# Patient Record
Sex: Male | Born: 1960 | Race: White | Hispanic: No | Marital: Married | Smoking: Former smoker
Health system: Southern US, Community
[De-identification: ages and names within clinical notes are randomized; demographics above are authoritative.]

## PROBLEM LIST (undated history)

## (undated) DIAGNOSIS — N529 Male erectile dysfunction, unspecified: Secondary | ICD-10-CM

## (undated) DIAGNOSIS — C787 Secondary malignant neoplasm of liver and intrahepatic bile duct: Secondary | ICD-10-CM

## (undated) DIAGNOSIS — C801 Malignant (primary) neoplasm, unspecified: Secondary | ICD-10-CM

## (undated) DIAGNOSIS — N400 Enlarged prostate without lower urinary tract symptoms: Secondary | ICD-10-CM

## (undated) DIAGNOSIS — K635 Polyp of colon: Secondary | ICD-10-CM

## (undated) DIAGNOSIS — C189 Malignant neoplasm of colon, unspecified: Secondary | ICD-10-CM

## (undated) DIAGNOSIS — J189 Pneumonia, unspecified organism: Secondary | ICD-10-CM

## (undated) DIAGNOSIS — E7800 Pure hypercholesterolemia, unspecified: Secondary | ICD-10-CM

## (undated) DIAGNOSIS — K219 Gastro-esophageal reflux disease without esophagitis: Secondary | ICD-10-CM

## (undated) DIAGNOSIS — I10 Essential (primary) hypertension: Secondary | ICD-10-CM

## (undated) HISTORY — DX: Malignant neoplasm of colon, unspecified: C18.9

## (undated) HISTORY — DX: Secondary malignant neoplasm of liver and intrahepatic bile duct: C78.7

## (undated) HISTORY — DX: Pure hypercholesterolemia, unspecified: E78.00

## (undated) HISTORY — DX: Polyp of colon: K63.5

## (undated) HISTORY — DX: Essential (primary) hypertension: I10

## (undated) HISTORY — DX: Male erectile dysfunction, unspecified: N52.9

## (undated) HISTORY — PX: PORT-A-CATH REMOVAL: SHX5289

## (undated) HISTORY — PX: FOOT SURGERY: SHX648

## (undated) HISTORY — DX: Malignant (primary) neoplasm, unspecified: C80.1

## (undated) HISTORY — DX: Benign prostatic hyperplasia without lower urinary tract symptoms: N40.0

---
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
## 2005-02-10 ENCOUNTER — Ambulatory Visit: Payer: Self-pay | Admitting: Family Medicine

## 2005-02-11 ENCOUNTER — Ambulatory Visit: Payer: Self-pay | Admitting: Family Medicine

## 2005-03-26 ENCOUNTER — Ambulatory Visit: Payer: Self-pay | Admitting: Family Medicine

## 2005-05-07 ENCOUNTER — Ambulatory Visit: Payer: Self-pay | Admitting: Family Medicine

## 2005-05-13 ENCOUNTER — Ambulatory Visit: Payer: Self-pay | Admitting: Family Medicine

## 2005-08-12 ENCOUNTER — Ambulatory Visit: Payer: Self-pay | Admitting: Family Medicine

## 2005-12-08 ENCOUNTER — Ambulatory Visit: Payer: Self-pay | Admitting: Family Medicine

## 2006-01-12 ENCOUNTER — Ambulatory Visit (HOSPITAL_COMMUNITY): Admission: RE | Admit: 2006-01-12 | Discharge: 2006-01-12 | Payer: Self-pay | Admitting: Chiropractic Medicine

## 2006-01-19 ENCOUNTER — Encounter: Admission: RE | Admit: 2006-01-19 | Discharge: 2006-01-19 | Payer: Self-pay | Admitting: Orthopedic Surgery

## 2006-02-12 ENCOUNTER — Ambulatory Visit: Payer: Self-pay | Admitting: Family Medicine

## 2006-02-15 ENCOUNTER — Ambulatory Visit: Payer: Self-pay | Admitting: Family Medicine

## 2006-03-10 ENCOUNTER — Ambulatory Visit: Payer: Self-pay | Admitting: Family Medicine

## 2006-03-29 ENCOUNTER — Ambulatory Visit: Payer: Self-pay | Admitting: Family Medicine

## 2006-05-13 ENCOUNTER — Ambulatory Visit: Payer: Self-pay | Admitting: Family Medicine

## 2006-05-17 ENCOUNTER — Ambulatory Visit: Payer: Self-pay | Admitting: Family Medicine

## 2006-05-28 ENCOUNTER — Ambulatory Visit: Payer: Self-pay | Admitting: Family Medicine

## 2007-02-15 ENCOUNTER — Encounter: Payer: Self-pay | Admitting: Family Medicine

## 2007-02-16 DIAGNOSIS — E7800 Pure hypercholesterolemia, unspecified: Secondary | ICD-10-CM

## 2007-02-16 DIAGNOSIS — I10 Essential (primary) hypertension: Secondary | ICD-10-CM | POA: Insufficient documentation

## 2007-02-23 ENCOUNTER — Ambulatory Visit: Payer: Self-pay | Admitting: Family Medicine

## 2007-02-23 LAB — CONVERTED CEMR LAB
ALT: 28 units/L (ref 0–53)
Albumin: 4 g/dL (ref 3.5–5.2)
BUN: 18 mg/dL (ref 6–23)
Bilirubin, Direct: 0.2 mg/dL (ref 0.0–0.3)
Calcium: 9 mg/dL (ref 8.4–10.5)
Cholesterol: 87 mg/dL (ref 0–200)
GFR calc Af Amer: 76 mL/min
GFR calc non Af Amer: 63 mL/min
Glucose, Bld: 98 mg/dL (ref 70–99)
LDL Cholesterol: 48 mg/dL (ref 0–99)
Total CHOL/HDL Ratio: 3.2
VLDL: 12 mg/dL (ref 0–40)

## 2007-02-25 ENCOUNTER — Ambulatory Visit: Payer: Self-pay | Admitting: Family Medicine

## 2007-02-25 DIAGNOSIS — K645 Perianal venous thrombosis: Secondary | ICD-10-CM

## 2007-02-25 DIAGNOSIS — N529 Male erectile dysfunction, unspecified: Secondary | ICD-10-CM | POA: Insufficient documentation

## 2008-02-27 ENCOUNTER — Ambulatory Visit: Payer: Self-pay | Admitting: Family Medicine

## 2008-02-27 LAB — CONVERTED CEMR LAB
ALT: 28 units/L (ref 0–53)
CO2: 26 meq/L (ref 19–32)
Calcium: 8.9 mg/dL (ref 8.4–10.5)
Creatinine, Ser: 1.3 mg/dL (ref 0.4–1.5)
Glucose, Bld: 88 mg/dL (ref 70–99)
HDL: 22 mg/dL — ABNORMAL LOW (ref >39.0)
TSH: 1.41 microintl units/mL (ref 0.35–5.50)
Total Bilirubin: 1 mg/dL (ref 0.3–1.2)
Total CHOL/HDL Ratio: 4.2
Total Protein: 6.9 g/dL (ref 6.0–8.3)
Triglycerides: 63 mg/dL (ref 0–149)
VLDL: 13 mg/dL (ref 0–40)

## 2008-02-29 ENCOUNTER — Ambulatory Visit: Payer: Self-pay | Admitting: Family Medicine

## 2008-12-13 ENCOUNTER — Ambulatory Visit: Payer: Self-pay | Admitting: Family Medicine

## 2008-12-13 DIAGNOSIS — R3129 Other microscopic hematuria: Secondary | ICD-10-CM

## 2008-12-13 LAB — CONVERTED CEMR LAB
Bacteria, UA: 0
Epithelial cells, urine: 0 /lpf
Ketones, urine, test strip: NEGATIVE
Protein, U semiquant: NEGATIVE

## 2009-01-14 ENCOUNTER — Encounter: Payer: Self-pay | Admitting: Family Medicine

## 2009-02-25 ENCOUNTER — Ambulatory Visit: Payer: Self-pay | Admitting: Family Medicine

## 2009-02-25 LAB — CONVERTED CEMR LAB
AST: 29 units/L (ref 0–37)
Albumin: 3.9 g/dL (ref 3.5–5.2)
Alkaline Phosphatase: 42 units/L (ref 39–117)
Basophils Relative: 0.3 % (ref 0.0–3.0)
Calcium: 9 mg/dL (ref 8.4–10.5)
Eosinophils Absolute: 0.6 10*3/uL (ref 0.0–0.7)
Eosinophils Relative: 7.5 % — ABNORMAL HIGH (ref 0.0–5.0)
GFR calc non Af Amer: 67.99 mL/min (ref >60)
HDL: 30.2 mg/dL — ABNORMAL LOW (ref >39.00)
LDL Cholesterol: 46 mg/dL (ref 0–99)
Lymphocytes Relative: 30.1 % (ref 12.0–46.0)
MCHC: 33.4 g/dL (ref 30.0–36.0)
MCV: 100.1 fL — ABNORMAL HIGH (ref 78.0–100.0)
Monocytes Absolute: 0.6 10*3/uL (ref 0.1–1.0)
Neutrophils Relative %: 54.9 % (ref 43.0–77.0)
PSA: 0.64 ng/mL (ref 0.10–4.00)
Platelets: 220 10*3/uL (ref 150.0–400.0)
Potassium: 4.7 meq/L (ref 3.5–5.1)
RBC: 5.08 M/uL (ref 4.22–5.81)
Sodium: 138 meq/L (ref 135–145)
TSH: 1.21 microintl units/mL (ref 0.35–5.50)
Total Bilirubin: 1 mg/dL (ref 0.3–1.2)
VLDL: 13.2 mg/dL (ref 0.0–40.0)
WBC: 8.5 10*3/uL (ref 4.5–10.5)

## 2009-03-04 ENCOUNTER — Ambulatory Visit: Payer: Self-pay | Admitting: Family Medicine

## 2009-03-04 DIAGNOSIS — R739 Hyperglycemia, unspecified: Secondary | ICD-10-CM | POA: Insufficient documentation

## 2009-05-09 ENCOUNTER — Encounter: Payer: Self-pay | Admitting: Family Medicine

## 2009-06-08 HISTORY — PX: PORTACATH PLACEMENT: SHX2246

## 2010-01-14 ENCOUNTER — Encounter (INDEPENDENT_AMBULATORY_CARE_PROVIDER_SITE_OTHER): Payer: Self-pay | Admitting: *Deleted

## 2010-01-16 ENCOUNTER — Encounter: Payer: Self-pay | Admitting: Family Medicine

## 2010-03-08 DIAGNOSIS — C787 Secondary malignant neoplasm of liver and intrahepatic bile duct: Secondary | ICD-10-CM

## 2010-03-08 DIAGNOSIS — C189 Malignant neoplasm of colon, unspecified: Secondary | ICD-10-CM

## 2010-03-08 HISTORY — DX: Malignant neoplasm of colon, unspecified: C18.9

## 2010-03-08 HISTORY — PX: COLON SURGERY: SHX602

## 2010-03-08 HISTORY — DX: Malignant neoplasm of colon, unspecified: C78.7

## 2010-03-10 ENCOUNTER — Ambulatory Visit: Payer: Self-pay | Admitting: Family Medicine

## 2010-03-11 LAB — CONVERTED CEMR LAB
AST: 24 units/L (ref 0–37)
Albumin: 4 g/dL (ref 3.5–5.2)
BUN: 15 mg/dL (ref 6–23)
Basophils Absolute: 0 10*3/uL (ref 0.0–0.1)
CO2: 28 meq/L (ref 19–32)
Cholesterol: 90 mg/dL (ref 0–200)
Eosinophils Absolute: 0.2 10*3/uL (ref 0.0–0.7)
Glucose, Bld: 99 mg/dL (ref 70–99)
HCT: 44.7 % (ref 39.0–52.0)
Hemoglobin: 15.5 g/dL (ref 13.0–17.0)
Lymphs Abs: 2.7 10*3/uL (ref 0.7–4.0)
MCHC: 34.7 g/dL (ref 30.0–36.0)
MCV: 97.1 fL (ref 78.0–100.0)
Monocytes Absolute: 0.5 10*3/uL (ref 0.1–1.0)
Neutro Abs: 4.4 10*3/uL (ref 1.4–7.7)
PSA: 0.42 ng/mL (ref 0.10–4.00)
Platelets: 258 10*3/uL (ref 150.0–400.0)
Potassium: 4.8 meq/L (ref 3.5–5.1)
RDW: 13.2 % (ref 11.5–14.6)
Sodium: 135 meq/L (ref 135–145)

## 2010-04-01 ENCOUNTER — Encounter: Payer: Self-pay | Admitting: Family Medicine

## 2010-04-01 ENCOUNTER — Telehealth: Payer: Self-pay | Admitting: Gastroenterology

## 2010-04-01 ENCOUNTER — Telehealth: Payer: Self-pay | Admitting: Family Medicine

## 2010-04-01 ENCOUNTER — Ambulatory Visit: Payer: Self-pay | Admitting: Cardiology

## 2010-04-01 ENCOUNTER — Inpatient Hospital Stay (HOSPITAL_COMMUNITY)
Admission: EM | Admit: 2010-04-01 | Discharge: 2010-04-08 | Payer: Self-pay | Source: Home / Self Care | Admitting: Internal Medicine

## 2010-04-01 ENCOUNTER — Ambulatory Visit: Payer: Self-pay | Admitting: Internal Medicine

## 2010-04-01 LAB — CONVERTED CEMR LAB
ALT: 64 U/L — ABNORMAL HIGH
AST: 35 U/L
Albumin: 3.7 g/dL
Alkaline Phosphatase: 49 U/L
BUN: 20 mg/dL
Basophils Absolute: 0 K/uL
Basophils Relative: 0.2 %
Bilirubin Urine: NEGATIVE
Bilirubin, Direct: 0.2 mg/dL
CO2: 28 meq/L
Calcium: 9.2 mg/dL
Casts: 0 /LPF
Chloride: 100 meq/L
Creatinine, Ser: 1.4 mg/dL
Eosinophils Absolute: 0.1 K/uL
Eosinophils Relative: 0.4 %
Epithelial cells, urine: 0 /LPF
GFR calc non Af Amer: 57.61 mL/min
Glucose, Bld: 101 mg/dL — ABNORMAL HIGH
Glucose, Urine, Semiquant: NEGATIVE
HCT: 45.2 %
Hemoglobin: 15.5 g/dL
Ketones, urine, test strip: NEGATIVE
Lipase: 16 U/L
Lymphocytes Relative: 15.6 %
Lymphs Abs: 2.3 K/uL
MCHC: 34.3 g/dL
MCV: 97.1 fL
Monocytes Absolute: 1.2 K/uL — ABNORMAL HIGH
Monocytes Relative: 8.4 %
Neutro Abs: 11.1 K/uL — ABNORMAL HIGH
Neutrophils Relative %: 75.4 %
Nitrite: NEGATIVE
Platelets: 323 K/uL
Potassium: 5.1 meq/L
RBC: 4.66 M/uL
RDW: 13.5 %
Sodium: 135 meq/L
Specific Gravity, Urine: 1.02
TSH: 1.86 u[IU]/mL
Total Bilirubin: 0.9 mg/dL
Total CK: 82 units/L (ref 7–232)
Total Protein: 6.9 g/dL
Urobilinogen, UA: 2
WBC: 14.7 10*3/microliter — ABNORMAL HIGH
pH: 6

## 2010-04-03 ENCOUNTER — Encounter (INDEPENDENT_AMBULATORY_CARE_PROVIDER_SITE_OTHER): Payer: Self-pay

## 2010-04-03 ENCOUNTER — Other Ambulatory Visit (INDEPENDENT_AMBULATORY_CARE_PROVIDER_SITE_OTHER): Payer: Self-pay | Admitting: Surgery

## 2010-04-03 HISTORY — PX: RIGHT COLECTOMY: SHX853

## 2010-04-18 ENCOUNTER — Encounter: Payer: Self-pay | Admitting: Family Medicine

## 2010-04-22 ENCOUNTER — Ambulatory Visit: Payer: Self-pay | Admitting: Oncology

## 2010-04-29 ENCOUNTER — Encounter: Payer: Self-pay | Admitting: Family Medicine

## 2010-05-06 ENCOUNTER — Ambulatory Visit (HOSPITAL_COMMUNITY)
Admission: RE | Admit: 2010-05-06 | Discharge: 2010-05-06 | Payer: Self-pay | Source: Home / Self Care | Admitting: Surgery

## 2010-05-08 LAB — CBC WITH DIFFERENTIAL/PLATELET
Basophils Absolute: 0 10*3/uL (ref 0.0–0.1)
Eosinophils Absolute: 0.1 10*3/uL (ref 0.0–0.5)
HGB: 14.9 g/dL (ref 13.0–17.1)
LYMPH%: 45.2 % (ref 14.0–49.0)
MCV: 95.7 fL (ref 79.3–98.0)
MONO#: 0.6 10*3/uL (ref 0.1–0.9)
MONO%: 8.7 % (ref 0.0–14.0)
NEUT#: 3 10*3/uL (ref 1.5–6.5)
Platelets: 249 10*3/uL (ref 140–400)
WBC: 6.8 10*3/uL (ref 4.0–10.3)

## 2010-05-08 LAB — MAGNESIUM: Magnesium: 2.3 mg/dL (ref 1.5–2.5)

## 2010-05-08 LAB — COMPREHENSIVE METABOLIC PANEL
Albumin: 4 g/dL (ref 3.5–5.2)
Alkaline Phosphatase: 45 U/L (ref 39–117)
BUN: 17 mg/dL (ref 6–23)
CO2: 29 mEq/L (ref 19–32)
Glucose, Bld: 85 mg/dL (ref 70–99)
Potassium: 4.4 mEq/L (ref 3.5–5.3)
Total Bilirubin: 0.7 mg/dL (ref 0.3–1.2)
Total Protein: 6.9 g/dL (ref 6.0–8.3)

## 2010-05-10 DIAGNOSIS — C189 Malignant neoplasm of colon, unspecified: Secondary | ICD-10-CM

## 2010-05-13 ENCOUNTER — Encounter: Payer: Self-pay | Admitting: Family Medicine

## 2010-05-26 ENCOUNTER — Ambulatory Visit: Payer: Self-pay | Admitting: Oncology

## 2010-05-27 LAB — COMPREHENSIVE METABOLIC PANEL
ALT: 50 U/L (ref 0–53)
Albumin: 3.8 g/dL (ref 3.5–5.2)
CO2: 29 mEq/L (ref 19–32)
Calcium: 9 mg/dL (ref 8.4–10.5)
Chloride: 104 mEq/L (ref 96–112)
Glucose, Bld: 111 mg/dL — ABNORMAL HIGH (ref 70–99)
Potassium: 4.1 mEq/L (ref 3.5–5.3)
Sodium: 138 mEq/L (ref 135–145)
Total Bilirubin: 0.5 mg/dL (ref 0.3–1.2)
Total Protein: 6.8 g/dL (ref 6.0–8.3)

## 2010-05-27 LAB — CBC WITH DIFFERENTIAL/PLATELET
BASO%: 0.5 % (ref 0.0–2.0)
Eosinophils Absolute: 0.2 10*3/uL (ref 0.0–0.5)
LYMPH%: 49.7 % — ABNORMAL HIGH (ref 14.0–49.0)
MCHC: 34.2 g/dL (ref 32.0–36.0)
MONO#: 0.4 10*3/uL (ref 0.1–0.9)
NEUT#: 1.6 10*3/uL (ref 1.5–6.5)
Platelets: 221 10*3/uL (ref 140–400)
RBC: 4.62 10*6/uL (ref 4.20–5.82)
RDW: 13.6 % (ref 11.0–14.6)
WBC: 4.2 10*3/uL (ref 4.0–10.3)
lymph#: 2.1 10*3/uL (ref 0.9–3.3)

## 2010-05-27 LAB — MAGNESIUM: Magnesium: 2.3 mg/dL (ref 1.5–2.5)

## 2010-05-30 ENCOUNTER — Encounter: Payer: Self-pay | Admitting: Family Medicine

## 2010-06-11 ENCOUNTER — Ambulatory Visit: Payer: Self-pay | Admitting: Oncology

## 2010-06-11 LAB — CBC WITH DIFFERENTIAL/PLATELET
BASO%: 0.6 % (ref 0.0–2.0)
Basophils Absolute: 0 10*3/uL (ref 0.0–0.1)
EOS%: 4.1 % (ref 0.0–7.0)
Eosinophils Absolute: 0.1 10*3/uL (ref 0.0–0.5)
HCT: 44.7 % (ref 38.4–49.9)
HGB: 15.9 g/dL (ref 13.0–17.1)
LYMPH%: 63.8 % — ABNORMAL HIGH (ref 14.0–49.0)
MCH: 32.7 pg (ref 27.2–33.4)
MCHC: 35.6 g/dL (ref 32.0–36.0)
MCV: 92 fL (ref 79.3–98.0)
MONO#: 0.4 10*3/uL (ref 0.1–0.9)
MONO%: 12.5 % (ref 0.0–14.0)
NEUT#: 0.6 10*3/uL — ABNORMAL LOW (ref 1.5–6.5)
NEUT%: 19 % — ABNORMAL LOW (ref 39.0–75.0)
Platelets: 141 10*3/uL (ref 140–400)
RBC: 4.86 10*6/uL (ref 4.20–5.82)
RDW: 13.6 % (ref 11.0–14.6)
WBC: 3.2 10*3/uL — ABNORMAL LOW (ref 4.0–10.3)
lymph#: 2 10*3/uL (ref 0.9–3.3)
nRBC: 0 % (ref 0–0)

## 2010-06-11 LAB — COMPREHENSIVE METABOLIC PANEL
ALT: 64 U/L — ABNORMAL HIGH (ref 0–53)
AST: 45 U/L — ABNORMAL HIGH (ref 0–37)
Albumin: 3.5 g/dL (ref 3.5–5.2)
Alkaline Phosphatase: 58 U/L (ref 39–117)
BUN: 17 mg/dL (ref 6–23)
CO2: 28 mEq/L (ref 19–32)
Calcium: 9 mg/dL (ref 8.4–10.5)
Chloride: 104 mEq/L (ref 96–112)
Creatinine, Ser: 1.57 mg/dL — ABNORMAL HIGH (ref 0.40–1.50)
Glucose, Bld: 113 mg/dL — ABNORMAL HIGH (ref 70–99)
Potassium: 4.8 mEq/L (ref 3.5–5.3)
Sodium: 138 mEq/L (ref 135–145)
Total Bilirubin: 0.7 mg/dL (ref 0.3–1.2)
Total Protein: 6.8 g/dL (ref 6.0–8.3)

## 2010-06-11 LAB — MAGNESIUM: Magnesium: 2.5 mg/dL (ref 1.5–2.5)

## 2010-06-16 LAB — CBC WITH DIFFERENTIAL/PLATELET
BASO%: 0.9 % (ref 0.0–2.0)
Basophils Absolute: 0 10*3/uL (ref 0.0–0.1)
EOS%: 2.8 % (ref 0.0–7.0)
Eosinophils Absolute: 0.1 10*3/uL (ref 0.0–0.5)
HCT: 43.4 % (ref 38.4–49.9)
HGB: 15.2 g/dL (ref 13.0–17.1)
LYMPH%: 59.9 % — ABNORMAL HIGH (ref 14.0–49.0)
MCH: 33.4 pg (ref 27.2–33.4)
MCHC: 35 g/dL (ref 32.0–36.0)
MCV: 95.6 fL (ref 79.3–98.0)
MONO#: 0.5 10*3/uL (ref 0.1–0.9)
MONO%: 14.2 % — ABNORMAL HIGH (ref 0.0–14.0)
NEUT#: 0.8 10*3/uL — ABNORMAL LOW (ref 1.5–6.5)
NEUT%: 22.2 % — ABNORMAL LOW (ref 39.0–75.0)
Platelets: 190 10*3/uL (ref 140–400)
RBC: 4.54 10*6/uL (ref 4.20–5.82)
RDW: 14 % (ref 11.0–14.6)
WBC: 3.8 10*3/uL — ABNORMAL LOW (ref 4.0–10.3)
lymph#: 2.3 10*3/uL (ref 0.9–3.3)

## 2010-06-16 LAB — COMPREHENSIVE METABOLIC PANEL
ALT: 54 U/L — ABNORMAL HIGH (ref 0–53)
AST: 42 U/L — ABNORMAL HIGH (ref 0–37)
Albumin: 3.6 g/dL (ref 3.5–5.2)
Alkaline Phosphatase: 53 U/L (ref 39–117)
BUN: 19 mg/dL (ref 6–23)
CO2: 31 mEq/L (ref 19–32)
Calcium: 9.4 mg/dL (ref 8.4–10.5)
Chloride: 103 mEq/L (ref 96–112)
Creatinine, Ser: 1.32 mg/dL (ref 0.40–1.50)
Glucose, Bld: 88 mg/dL (ref 70–99)
Potassium: 5 mEq/L (ref 3.5–5.3)
Sodium: 141 mEq/L (ref 135–145)
Total Bilirubin: 0.6 mg/dL (ref 0.3–1.2)
Total Protein: 6.6 g/dL (ref 6.0–8.3)

## 2010-06-16 LAB — MAGNESIUM: Magnesium: 2.4 mg/dL (ref 1.5–2.5)

## 2010-06-23 ENCOUNTER — Encounter: Payer: Self-pay | Admitting: Family Medicine

## 2010-06-23 LAB — CBC WITH DIFFERENTIAL/PLATELET
BASO%: 0.5 % (ref 0.0–2.0)
Basophils Absolute: 0 10*3/uL (ref 0.0–0.1)
EOS%: 2.1 % (ref 0.0–7.0)
Eosinophils Absolute: 0.1 10*3/uL (ref 0.0–0.5)
HCT: 43.2 % (ref 38.4–49.9)
HGB: 15.1 g/dL (ref 13.0–17.1)
LYMPH%: 43.9 % (ref 14.0–49.0)
MCH: 33.4 pg (ref 27.2–33.4)
MCHC: 34.9 g/dL (ref 32.0–36.0)
MCV: 95.8 fL (ref 79.3–98.0)
MONO#: 0.5 10*3/uL (ref 0.1–0.9)
MONO%: 10.2 % (ref 0.0–14.0)
NEUT#: 2.3 10*3/uL (ref 1.5–6.5)
NEUT%: 43.3 % (ref 39.0–75.0)
Platelets: 190 10*3/uL (ref 140–400)
RBC: 4.51 10*6/uL (ref 4.20–5.82)
RDW: 14.4 % (ref 11.0–14.6)
WBC: 5.4 10*3/uL (ref 4.0–10.3)
lymph#: 2.3 10*3/uL (ref 0.9–3.3)

## 2010-06-23 LAB — COMPREHENSIVE METABOLIC PANEL
ALT: 37 U/L (ref 0–53)
AST: 29 U/L (ref 0–37)
Albumin: 3.8 g/dL (ref 3.5–5.2)
Alkaline Phosphatase: 49 U/L (ref 39–117)
BUN: 21 mg/dL (ref 6–23)
CO2: 29 mEq/L (ref 19–32)
Calcium: 9.3 mg/dL (ref 8.4–10.5)
Chloride: 104 mEq/L (ref 96–112)
Creatinine, Ser: 1.71 mg/dL — ABNORMAL HIGH (ref 0.40–1.50)
Glucose, Bld: 99 mg/dL (ref 70–99)
Potassium: 4.9 mEq/L (ref 3.5–5.3)
Sodium: 139 mEq/L (ref 135–145)
Total Bilirubin: 0.7 mg/dL (ref 0.3–1.2)
Total Protein: 7 g/dL (ref 6.0–8.3)

## 2010-06-23 LAB — MAGNESIUM: Magnesium: 2.4 mg/dL (ref 1.5–2.5)

## 2010-07-07 LAB — COMPREHENSIVE METABOLIC PANEL
ALT: 43 U/L (ref 0–53)
CO2: 26 mEq/L (ref 19–32)
Chloride: 106 mEq/L (ref 96–112)
Sodium: 138 mEq/L (ref 135–145)
Total Bilirubin: 0.7 mg/dL (ref 0.3–1.2)
Total Protein: 7 g/dL (ref 6.0–8.3)

## 2010-07-07 LAB — CBC WITH DIFFERENTIAL/PLATELET
BASO%: 0.8 % (ref 0.0–2.0)
LYMPH%: 33.9 % (ref 14.0–49.0)
MCHC: 34.9 g/dL (ref 32.0–36.0)
MONO#: 0.5 10*3/uL (ref 0.1–0.9)
RBC: 4.53 10*6/uL (ref 4.20–5.82)
WBC: 6.5 10*3/uL (ref 4.0–10.3)
lymph#: 2.2 10*3/uL (ref 0.9–3.3)

## 2010-07-07 LAB — MAGNESIUM: Magnesium: 2.4 mg/dL (ref 1.5–2.5)

## 2010-07-09 ENCOUNTER — Ambulatory Visit (HOSPITAL_BASED_OUTPATIENT_CLINIC_OR_DEPARTMENT_OTHER): Payer: 59 | Admitting: Oncology

## 2010-07-09 DIAGNOSIS — C182 Malignant neoplasm of ascending colon: Secondary | ICD-10-CM

## 2010-07-09 DIAGNOSIS — Z5189 Encounter for other specified aftercare: Secondary | ICD-10-CM

## 2010-07-10 NOTE — Letter (Signed)
Summary: Nadara Eaton letter  [place] at [facility]  [address] [place], [place] [address]   Phone: [phone]  Fax: [phone]       01/14/2010 MRN: [id number]  SEIBERT KEETER [address] [place], [place]  [address]  Dear Mr. [facility] [place], and [place] announce the retirement of Arta Silence, M.D., from full-time practice at the [facility] office effective December 05, 2009 and his plans of returning part-time.  It is important to Dr. Hetty Ely and to our practice that you understand that [facility] has seven physicians in our office for your health care needs.  We will continue to offer the same exceptional care that you have today.    Dr. Hetty Ely has spoken to many of you about his plans for retirement and returning part-time in the fall.   We will continue to work with you through the transition to schedule appointments for you in the office and meet the high standards that [facility] is committed to.   Again, it is with great pleasure that we share the news that Dr. Hetty Ely will return to [facility] at [facility] in October of 2011 with a reduced schedule.    If you have any questions, or would like to request an appointment with one of our physicians, please call us at [phone] and press the option for Scheduling an appointment.  We take pleasure in providing you with excellent patient care and look forward to seeing you at your next office visit.  Our [facility] Physicians are:  Tillman Abide, M.D. Laurita Quint, M.D. Roxy Manns, M.D. Kerby Nora, M.D. Hannah Beat, M.D. Ruthe Mannan, M.D. We proudly welcomed Raechel Ache, M.D. and Eustaquio Boyden, M.D. to the practice in July/August 2011.  Sincerely,  [facility]

## 2010-07-10 NOTE — Consult Note (Signed)
Summary: Dr.David Aron [facility] Newman   Imported By: Beau Fanny 06/18/2010 16:47:07  _____________________________________________________________________  External Attachment:    Type:   Image     Comment:   External Document

## 2010-07-10 NOTE — Letter (Signed)
Summary: [facility]  [facility]   Imported By: Maryln Gottron 07/02/2010 13:21:00  _____________________________________________________________________  External Attachment:    Type:   Image     Comment:   External Document
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
## 2010-07-10 NOTE — Progress Notes (Signed)
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 Initial call taken by: Melody Comas,  April 01, 2010 3:30 PM  Follow-up for Phone Call        routed to Dr. Reece Agar.  He has patient coming to to talk about results.  Follow-up by: Crawford Givens MD,  April 01, 2010 3:40 PM  Additional Follow-up for Phone Call Additional follow up Details #1::        Dsicussed results with patient.  aware that looks like primary colon cancer with concern for infection/microperf.  agrees to admission with IV abx and pain medication.  will obtain GI and surg consult after admission.  spoke with Dr. Arthor Captain of [facility].  admit to [facility] Additional Follow-up by: Eustaquio Boyden  MD,  April 01, 2010 5:15 PM
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
## 2010-07-10 NOTE — Progress Notes (Signed)
  Phone Note From Other Clinic   Summary of Call: Call from Dr. Eustaquio Boyden at 3:30p regarding an abnormal CT scan showing a mass and surrounding inflammation in the right colon, worrisome for possible colon cancer with microperforation. The patient apparently has right lower quadrant tenderness with guarding, and an elevated white count. I have advised him that the patient should be hospitalized for further management and that GI consultation and general surgical consultation would be indicated after admission. Initial call taken by: Meryl Dare MD Clementeen Graham,  April 01, 2010 4:45 PM
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
## 2010-07-10 NOTE — Letter (Signed)
Summary: [facility]   Imported By: Maryln Gottron 05/02/2010 12:26:14  _____________________________________________________________________  External Attachment:    Type:   Image     Comment:   External Document  Appended Document: [facility] abd tumor, going to med onc, picked up by Dr. Reece Agar.

## 2010-07-10 NOTE — Letter (Signed)
Summary: [facility]  [facility]   Imported By: Lanelle Bal 05/22/2010 11:56:02  _____________________________________________________________________  External Attachment:    Type:   Image     Comment:   External Document

## 2010-07-10 NOTE — Letter (Signed)
Summary: [facility]  [facility]   Imported By: Maryln Gottron 01/24/2010 11:05:00  _____________________________________________________________________  External Attachment:    Type:   Image     Comment:   External Document

## 2010-07-10 NOTE — Letter (Signed)
Summary: Schaller Retirement letter  [facility] [place]  [address]   [place], [place] [address]   Phone: [phone]  Fax: [phone]       01/14/2010 MRN: [id number]  Bryan Fields [address] [place], [place]  [address]  Dear Mr. Febles,  [facility] [place], and [facility] announce the retirement of Hanish N. SCHALLER, M.D., from full-time practice at the [place] office effective December 05, 2009 and his plans of returning part-time.  It is important to Dr. Schaller and to our practice that you understand that [facility] [place] has seven physicians in our office for your health care needs.  We will continue to offer the same exceptional care that you have today.    Dr. Schaller has spoken to many of you about his plans for retirement and returning part-time in the fall.   We will continue to work with you through the transition to schedule appointments for you in the office and meet the high standards that [facility] is committed to.   Again, it is with great pleasure that we share the news that Dr. Schaller will return to [facility] [place] in October of 2011 with a reduced schedule.    If you have any questions, or would like to request an appointment with one of our physicians, please call us at [phone] and press the option for Scheduling an appointment.  We take pleasure in providing you with excellent patient care and look forward to seeing you at your next office visit.  Our [place] Physicians are:  Richard Letvak, M.D. Truxton Schaller, M.D. Marne Tower, M.D. Amy Bedsole, M.D. Spencer Copland, M.D. Talia Aron, M.D. We proudly welcomed Shaw Duncan, M.D. and Javier Gutierrez, M.D. to the practice in July/August 2011.  Sincerely,   [facility] [place] 

## 2010-07-21 ENCOUNTER — Encounter (HOSPITAL_BASED_OUTPATIENT_CLINIC_OR_DEPARTMENT_OTHER): Payer: 59 | Admitting: Oncology

## 2010-07-21 ENCOUNTER — Other Ambulatory Visit: Payer: Self-pay | Admitting: Oncology

## 2010-07-21 DIAGNOSIS — C182 Malignant neoplasm of ascending colon: Secondary | ICD-10-CM

## 2010-07-21 DIAGNOSIS — D6959 Other secondary thrombocytopenia: Secondary | ICD-10-CM

## 2010-07-21 DIAGNOSIS — Z5111 Encounter for antineoplastic chemotherapy: Secondary | ICD-10-CM

## 2010-07-21 DIAGNOSIS — Z5189 Encounter for other specified aftercare: Secondary | ICD-10-CM

## 2010-07-21 DIAGNOSIS — D702 Other drug-induced agranulocytosis: Secondary | ICD-10-CM

## 2010-07-21 LAB — COMPREHENSIVE METABOLIC PANEL
AST: 38 U/L — ABNORMAL HIGH (ref 0–37)
Alkaline Phosphatase: 98 U/L (ref 39–117)
BUN: 16 mg/dL (ref 6–23)
Creatinine, Ser: 1.39 mg/dL (ref 0.40–1.50)
Total Bilirubin: 0.6 mg/dL (ref 0.3–1.2)

## 2010-07-21 LAB — CBC WITH DIFFERENTIAL/PLATELET
BASO%: 0.5 % (ref 0.0–2.0)
EOS%: 1.7 % (ref 0.0–7.0)
HCT: 42.2 % (ref 38.4–49.9)
LYMPH%: 34 % (ref 14.0–49.0)
MCH: 33.3 pg (ref 27.2–33.4)
MCHC: 34.6 g/dL (ref 32.0–36.0)
MCV: 96.1 fL (ref 79.3–98.0)
MONO%: 11.1 % (ref 0.0–14.0)
NEUT%: 52.7 % (ref 39.0–75.0)
Platelets: 93 10*3/uL — ABNORMAL LOW (ref 140–400)
lymph#: 2.1 10*3/uL (ref 0.9–3.3)

## 2010-07-23 ENCOUNTER — Encounter (HOSPITAL_BASED_OUTPATIENT_CLINIC_OR_DEPARTMENT_OTHER): Payer: 59 | Admitting: Oncology

## 2010-07-23 DIAGNOSIS — Z5189 Encounter for other specified aftercare: Secondary | ICD-10-CM

## 2010-07-23 DIAGNOSIS — C182 Malignant neoplasm of ascending colon: Secondary | ICD-10-CM

## 2010-08-04 ENCOUNTER — Other Ambulatory Visit: Payer: Self-pay | Admitting: Oncology

## 2010-08-04 ENCOUNTER — Encounter: Payer: Self-pay | Admitting: Family Medicine

## 2010-08-04 ENCOUNTER — Encounter (HOSPITAL_BASED_OUTPATIENT_CLINIC_OR_DEPARTMENT_OTHER): Payer: 59 | Admitting: Oncology

## 2010-08-04 DIAGNOSIS — D6959 Other secondary thrombocytopenia: Secondary | ICD-10-CM

## 2010-08-04 DIAGNOSIS — Z5189 Encounter for other specified aftercare: Secondary | ICD-10-CM

## 2010-08-04 DIAGNOSIS — D702 Other drug-induced agranulocytosis: Secondary | ICD-10-CM

## 2010-08-04 DIAGNOSIS — Z5111 Encounter for antineoplastic chemotherapy: Secondary | ICD-10-CM

## 2010-08-04 DIAGNOSIS — C180 Malignant neoplasm of cecum: Secondary | ICD-10-CM

## 2010-08-04 DIAGNOSIS — C182 Malignant neoplasm of ascending colon: Secondary | ICD-10-CM

## 2010-08-04 LAB — COMPREHENSIVE METABOLIC PANEL
ALT: 51 U/L (ref 0–53)
AST: 48 U/L — ABNORMAL HIGH (ref 0–37)
Calcium: 8.8 mg/dL (ref 8.4–10.5)
Chloride: 104 mEq/L (ref 96–112)
Creatinine, Ser: 1.39 mg/dL (ref 0.40–1.50)
Sodium: 138 mEq/L (ref 135–145)
Total Bilirubin: 0.6 mg/dL (ref 0.3–1.2)
Total Protein: 6.6 g/dL (ref 6.0–8.3)

## 2010-08-04 LAB — CBC WITH DIFFERENTIAL/PLATELET
BASO%: 0.9 % (ref 0.0–2.0)
EOS%: 2 % (ref 0.0–7.0)
HCT: 41.3 % (ref 38.4–49.9)
MCH: 34.7 pg — ABNORMAL HIGH (ref 27.2–33.4)
MCHC: 35 g/dL (ref 32.0–36.0)
MONO#: 0.7 10*3/uL (ref 0.1–0.9)
NEUT%: 58.8 % (ref 39.0–75.0)
RBC: 4.17 10*6/uL — ABNORMAL LOW (ref 4.20–5.82)
WBC: 7.7 10*3/uL (ref 4.0–10.3)
lymph#: 2.2 10*3/uL (ref 0.9–3.3)

## 2010-08-06 ENCOUNTER — Encounter (HOSPITAL_BASED_OUTPATIENT_CLINIC_OR_DEPARTMENT_OTHER): Payer: 59 | Admitting: Oncology

## 2010-08-06 DIAGNOSIS — Z5189 Encounter for other specified aftercare: Secondary | ICD-10-CM

## 2010-08-06 DIAGNOSIS — C182 Malignant neoplasm of ascending colon: Secondary | ICD-10-CM

## 2010-08-18 ENCOUNTER — Other Ambulatory Visit: Payer: Self-pay | Admitting: Oncology

## 2010-08-18 ENCOUNTER — Encounter (HOSPITAL_BASED_OUTPATIENT_CLINIC_OR_DEPARTMENT_OTHER): Payer: 59 | Admitting: Oncology

## 2010-08-18 DIAGNOSIS — C182 Malignant neoplasm of ascending colon: Secondary | ICD-10-CM

## 2010-08-18 DIAGNOSIS — Z5189 Encounter for other specified aftercare: Secondary | ICD-10-CM

## 2010-08-18 DIAGNOSIS — Z5111 Encounter for antineoplastic chemotherapy: Secondary | ICD-10-CM

## 2010-08-18 DIAGNOSIS — C180 Malignant neoplasm of cecum: Secondary | ICD-10-CM

## 2010-08-18 DIAGNOSIS — D6959 Other secondary thrombocytopenia: Secondary | ICD-10-CM

## 2010-08-18 DIAGNOSIS — D702 Other drug-induced agranulocytosis: Secondary | ICD-10-CM

## 2010-08-18 LAB — CBC WITH DIFFERENTIAL/PLATELET
Basophils Absolute: 0 10*3/uL (ref 0.0–0.1)
EOS%: 2.1 % (ref 0.0–7.0)
HCT: 40.2 % (ref 38.4–49.9)
HGB: 13.9 g/dL (ref 13.0–17.1)
MCH: 34.9 pg — ABNORMAL HIGH (ref 27.2–33.4)
MCHC: 34.5 g/dL (ref 32.0–36.0)
MCV: 101.1 fL — ABNORMAL HIGH (ref 79.3–98.0)
MONO%: 10.4 % (ref 0.0–14.0)
NEUT%: 61.7 % (ref 39.0–75.0)

## 2010-08-18 LAB — COMPREHENSIVE METABOLIC PANEL
AST: 61 U/L — ABNORMAL HIGH (ref 0–37)
Alkaline Phosphatase: 126 U/L — ABNORMAL HIGH (ref 39–117)
BUN: 18 mg/dL (ref 6–23)
Calcium: 8.5 mg/dL (ref 8.4–10.5)
Creatinine, Ser: 1.33 mg/dL (ref 0.40–1.50)
Total Bilirubin: 0.8 mg/dL (ref 0.3–1.2)

## 2010-08-19 LAB — CBC
HCT: 43.5 % (ref 39.0–52.0)
Hemoglobin: 15.1 g/dL (ref 13.0–17.0)
RBC: 4.47 MIL/uL (ref 4.22–5.81)
RDW: 13.3 % (ref 11.5–15.5)
WBC: 5.7 10*3/uL (ref 4.0–10.5)

## 2010-08-19 LAB — SURGICAL PCR SCREEN: MRSA, PCR: NEGATIVE

## 2010-08-19 NOTE — Letter (Signed)
Summary: [facility]  [facility]   Imported By: Lanelle Bal 08/11/2010 11:43:12  _____________________________________________________________________  External Attachment:    Type:   Image     Comment:   External Document

## 2010-08-20 ENCOUNTER — Encounter: Payer: 59 | Admitting: Oncology

## 2010-08-20 LAB — COMPREHENSIVE METABOLIC PANEL
ALT: 42 U/L (ref 0–53)
ALT: 48 U/L (ref 0–53)
AST: 27 U/L (ref 0–37)
AST: 28 U/L (ref 0–37)
Albumin: 2.9 g/dL — ABNORMAL LOW (ref 3.5–5.2)
Alkaline Phosphatase: 39 U/L (ref 39–117)
CO2: 27 mEq/L (ref 19–32)
Chloride: 101 mEq/L (ref 96–112)
Chloride: 101 mEq/L (ref 96–112)
Creatinine, Ser: 1.25 mg/dL (ref 0.4–1.5)
GFR calc Af Amer: >60 mL/min (ref >60)
GFR calc non Af Amer: >60 mL/min (ref >60)
Potassium: 4.3 mEq/L (ref 3.5–5.1)
Sodium: 135 mEq/L (ref 135–145)
Total Bilirubin: 0.6 mg/dL (ref 0.3–1.2)
Total Bilirubin: 1.3 mg/dL — ABNORMAL HIGH (ref 0.3–1.2)
Total Protein: 5.7 g/dL — ABNORMAL LOW (ref 6.0–8.3)

## 2010-08-20 LAB — CBC
HCT: 43.7 % (ref 39.0–52.0)
Hemoglobin: 14.8 g/dL (ref 13.0–17.0)
MCH: 32.2 pg (ref 26.0–34.0)
MCH: 32.7 pg (ref 26.0–34.0)
MCHC: 33.7 g/dL (ref 30.0–36.0)
Platelets: 298 10*3/uL (ref 150–400)
RBC: 4.53 MIL/uL (ref 4.22–5.81)
RDW: 13 % (ref 11.5–15.5)

## 2010-08-20 LAB — MAGNESIUM: Magnesium: 2.2 mg/dL (ref 1.5–2.5)

## 2010-08-20 LAB — DIFFERENTIAL
Basophils Absolute: 0 10*3/uL (ref 0.0–0.1)
Eosinophils Absolute: 0.2 10*3/uL (ref 0.0–0.7)
Eosinophils Relative: 3 % (ref 0–5)
Lymphocytes Relative: 35 % (ref 12–46)

## 2010-09-01 ENCOUNTER — Other Ambulatory Visit: Payer: Self-pay | Admitting: Oncology

## 2010-09-01 ENCOUNTER — Encounter (HOSPITAL_BASED_OUTPATIENT_CLINIC_OR_DEPARTMENT_OTHER): Payer: 59 | Admitting: Oncology

## 2010-09-01 DIAGNOSIS — C182 Malignant neoplasm of ascending colon: Secondary | ICD-10-CM

## 2010-09-01 DIAGNOSIS — Z5189 Encounter for other specified aftercare: Secondary | ICD-10-CM

## 2010-09-01 DIAGNOSIS — Z5111 Encounter for antineoplastic chemotherapy: Secondary | ICD-10-CM

## 2010-09-01 DIAGNOSIS — D6959 Other secondary thrombocytopenia: Secondary | ICD-10-CM

## 2010-09-01 DIAGNOSIS — D702 Other drug-induced agranulocytosis: Secondary | ICD-10-CM

## 2010-09-01 LAB — COMPREHENSIVE METABOLIC PANEL
AST: 42 U/L — ABNORMAL HIGH (ref 0–37)
Alkaline Phosphatase: 87 U/L (ref 39–117)
Glucose, Bld: 109 mg/dL — ABNORMAL HIGH (ref 70–99)
Sodium: 138 mEq/L (ref 135–145)
Total Bilirubin: 0.9 mg/dL (ref 0.3–1.2)
Total Protein: 6.5 g/dL (ref 6.0–8.3)

## 2010-09-01 LAB — CBC WITH DIFFERENTIAL/PLATELET
BASO%: 1.1 % (ref 0.0–2.0)
EOS%: 2.6 % (ref 0.0–7.0)
Eosinophils Absolute: 0.1 10*3/uL (ref 0.0–0.5)
LYMPH%: 46.3 % (ref 14.0–49.0)
MCH: 36 pg — ABNORMAL HIGH (ref 27.2–33.4)
MCHC: 34.8 g/dL (ref 32.0–36.0)
MCV: 103.3 fL — ABNORMAL HIGH (ref 79.3–98.0)
MONO%: 15.6 % — ABNORMAL HIGH (ref 0.0–14.0)
Platelets: 90 10*3/uL — ABNORMAL LOW (ref 140–400)
RBC: 3.65 10*6/uL — ABNORMAL LOW (ref 4.20–5.82)
RDW: 17.7 % — ABNORMAL HIGH (ref 11.0–14.6)

## 2010-09-03 ENCOUNTER — Encounter (HOSPITAL_BASED_OUTPATIENT_CLINIC_OR_DEPARTMENT_OTHER): Payer: 59 | Admitting: Oncology

## 2010-09-03 DIAGNOSIS — Z5189 Encounter for other specified aftercare: Secondary | ICD-10-CM

## 2010-09-03 DIAGNOSIS — C182 Malignant neoplasm of ascending colon: Secondary | ICD-10-CM

## 2010-09-15 ENCOUNTER — Other Ambulatory Visit: Payer: Self-pay | Admitting: Oncology

## 2010-09-15 ENCOUNTER — Encounter (HOSPITAL_BASED_OUTPATIENT_CLINIC_OR_DEPARTMENT_OTHER): Payer: 59 | Admitting: Oncology

## 2010-09-15 DIAGNOSIS — Z5189 Encounter for other specified aftercare: Secondary | ICD-10-CM

## 2010-09-15 DIAGNOSIS — D702 Other drug-induced agranulocytosis: Secondary | ICD-10-CM

## 2010-09-15 DIAGNOSIS — Z5111 Encounter for antineoplastic chemotherapy: Secondary | ICD-10-CM

## 2010-09-15 DIAGNOSIS — C182 Malignant neoplasm of ascending colon: Secondary | ICD-10-CM

## 2010-09-15 DIAGNOSIS — R109 Unspecified abdominal pain: Secondary | ICD-10-CM

## 2010-09-15 DIAGNOSIS — D6959 Other secondary thrombocytopenia: Secondary | ICD-10-CM

## 2010-09-15 LAB — CBC WITH DIFFERENTIAL/PLATELET
BASO%: 0.9 % (ref 0.0–2.0)
Eosinophils Absolute: 0.2 10*3/uL (ref 0.0–0.5)
HCT: 39 % (ref 38.4–49.9)
LYMPH%: 36.3 % (ref 14.0–49.0)
MCHC: 34.6 g/dL (ref 32.0–36.0)
MCV: 104.8 fL — ABNORMAL HIGH (ref 79.3–98.0)
MONO#: 0.5 10*3/uL (ref 0.1–0.9)
NEUT%: 49.4 % (ref 39.0–75.0)
Platelets: 99 10*3/uL — ABNORMAL LOW (ref 140–400)
WBC: 4.6 10*3/uL (ref 4.0–10.3)

## 2010-09-15 LAB — COMPREHENSIVE METABOLIC PANEL
CO2: 28 mEq/L (ref 19–32)
Creatinine, Ser: 1.3 mg/dL (ref 0.40–1.50)
Glucose, Bld: 123 mg/dL — ABNORMAL HIGH (ref 70–99)
Total Bilirubin: 0.7 mg/dL (ref 0.3–1.2)

## 2010-09-15 LAB — MAGNESIUM: Magnesium: 2.3 mg/dL (ref 1.5–2.5)

## 2010-09-17 ENCOUNTER — Encounter (HOSPITAL_BASED_OUTPATIENT_CLINIC_OR_DEPARTMENT_OTHER): Payer: 59 | Admitting: Oncology

## 2010-09-17 DIAGNOSIS — C182 Malignant neoplasm of ascending colon: Secondary | ICD-10-CM

## 2010-09-29 ENCOUNTER — Other Ambulatory Visit: Payer: Self-pay | Admitting: Oncology

## 2010-09-29 ENCOUNTER — Encounter (HOSPITAL_BASED_OUTPATIENT_CLINIC_OR_DEPARTMENT_OTHER): Payer: 59 | Admitting: Oncology

## 2010-09-29 DIAGNOSIS — C182 Malignant neoplasm of ascending colon: Secondary | ICD-10-CM

## 2010-09-29 DIAGNOSIS — Z5111 Encounter for antineoplastic chemotherapy: Secondary | ICD-10-CM

## 2010-09-29 DIAGNOSIS — Z5189 Encounter for other specified aftercare: Secondary | ICD-10-CM

## 2010-09-29 DIAGNOSIS — C180 Malignant neoplasm of cecum: Secondary | ICD-10-CM

## 2010-09-29 DIAGNOSIS — D702 Other drug-induced agranulocytosis: Secondary | ICD-10-CM

## 2010-09-29 DIAGNOSIS — D6959 Other secondary thrombocytopenia: Secondary | ICD-10-CM

## 2010-09-29 LAB — CBC WITH DIFFERENTIAL/PLATELET
BASO%: 0.5 % (ref 0.0–2.0)
Basophils Absolute: 0 10*3/uL (ref 0.0–0.1)
HCT: 37.4 % — ABNORMAL LOW (ref 38.4–49.9)
HGB: 13.1 g/dL (ref 13.0–17.1)
MONO#: 0.5 10*3/uL (ref 0.1–0.9)
NEUT#: 1 10*3/uL — ABNORMAL LOW (ref 1.5–6.5)
NEUT%: 33.4 % — ABNORMAL LOW (ref 39.0–75.0)
WBC: 3 10*3/uL — ABNORMAL LOW (ref 4.0–10.3)
lymph#: 1.4 10*3/uL (ref 0.9–3.3)

## 2010-09-29 LAB — COMPREHENSIVE METABOLIC PANEL
ALT: 43 U/L (ref 0–53)
BUN: 18 mg/dL (ref 6–23)
CO2: 26 mEq/L (ref 19–32)
Calcium: 8.7 mg/dL (ref 8.4–10.5)
Chloride: 107 mEq/L (ref 96–112)
Creatinine, Ser: 1.35 mg/dL (ref 0.40–1.50)

## 2010-09-29 LAB — MAGNESIUM: Magnesium: 2.2 mg/dL (ref 1.5–2.5)

## 2010-10-01 ENCOUNTER — Encounter (HOSPITAL_BASED_OUTPATIENT_CLINIC_OR_DEPARTMENT_OTHER): Payer: 59 | Admitting: Oncology

## 2010-10-01 DIAGNOSIS — Z5189 Encounter for other specified aftercare: Secondary | ICD-10-CM

## 2010-10-01 DIAGNOSIS — C182 Malignant neoplasm of ascending colon: Secondary | ICD-10-CM

## 2010-10-13 ENCOUNTER — Other Ambulatory Visit: Payer: Self-pay | Admitting: Oncology

## 2010-10-13 ENCOUNTER — Encounter (HOSPITAL_BASED_OUTPATIENT_CLINIC_OR_DEPARTMENT_OTHER): Payer: 59 | Admitting: Oncology

## 2010-10-13 DIAGNOSIS — R109 Unspecified abdominal pain: Secondary | ICD-10-CM

## 2010-10-13 DIAGNOSIS — D6959 Other secondary thrombocytopenia: Secondary | ICD-10-CM

## 2010-10-13 DIAGNOSIS — Z5111 Encounter for antineoplastic chemotherapy: Secondary | ICD-10-CM

## 2010-10-13 DIAGNOSIS — C182 Malignant neoplasm of ascending colon: Secondary | ICD-10-CM

## 2010-10-13 DIAGNOSIS — D702 Other drug-induced agranulocytosis: Secondary | ICD-10-CM

## 2010-10-13 LAB — COMPREHENSIVE METABOLIC PANEL
ALT: 40 U/L (ref 0–53)
AST: 37 U/L (ref 0–37)
Albumin: 3.5 g/dL (ref 3.5–5.2)
BUN: 19 mg/dL (ref 6–23)
Calcium: 9.2 mg/dL (ref 8.4–10.5)
Chloride: 103 mEq/L (ref 96–112)
Potassium: 4.6 mEq/L (ref 3.5–5.3)
Sodium: 135 mEq/L (ref 135–145)
Total Protein: 6.4 g/dL (ref 6.0–8.3)

## 2010-10-13 LAB — CBC WITH DIFFERENTIAL/PLATELET
BASO%: 0.6 % (ref 0.0–2.0)
Basophils Absolute: 0 10*3/uL (ref 0.0–0.1)
EOS%: 2.5 % (ref 0.0–7.0)
HGB: 13.5 g/dL (ref 13.0–17.1)
MCH: 37.5 pg — ABNORMAL HIGH (ref 27.2–33.4)
RBC: 3.62 10*6/uL — ABNORMAL LOW (ref 4.20–5.82)
RDW: 15.3 % — ABNORMAL HIGH (ref 11.0–14.6)
lymph#: 1.6 10*3/uL (ref 0.9–3.3)

## 2010-10-15 ENCOUNTER — Encounter (HOSPITAL_BASED_OUTPATIENT_CLINIC_OR_DEPARTMENT_OTHER): Payer: 59 | Admitting: Oncology

## 2010-10-15 ENCOUNTER — Encounter (INDEPENDENT_AMBULATORY_CARE_PROVIDER_SITE_OTHER): Payer: Self-pay | Admitting: Surgery

## 2010-10-15 DIAGNOSIS — C182 Malignant neoplasm of ascending colon: Secondary | ICD-10-CM

## 2010-10-15 DIAGNOSIS — Z5189 Encounter for other specified aftercare: Secondary | ICD-10-CM

## 2010-10-27 ENCOUNTER — Other Ambulatory Visit: Payer: Self-pay | Admitting: Oncology

## 2010-10-27 ENCOUNTER — Encounter (HOSPITAL_BASED_OUTPATIENT_CLINIC_OR_DEPARTMENT_OTHER): Payer: 59 | Admitting: Oncology

## 2010-10-27 DIAGNOSIS — C180 Malignant neoplasm of cecum: Secondary | ICD-10-CM

## 2010-10-27 DIAGNOSIS — C182 Malignant neoplasm of ascending colon: Secondary | ICD-10-CM

## 2010-10-27 DIAGNOSIS — D6959 Other secondary thrombocytopenia: Secondary | ICD-10-CM

## 2010-10-27 DIAGNOSIS — D702 Other drug-induced agranulocytosis: Secondary | ICD-10-CM

## 2010-10-27 DIAGNOSIS — Z5111 Encounter for antineoplastic chemotherapy: Secondary | ICD-10-CM

## 2010-10-27 DIAGNOSIS — Z5189 Encounter for other specified aftercare: Secondary | ICD-10-CM

## 2010-10-27 LAB — COMPREHENSIVE METABOLIC PANEL
ALT: 45 U/L (ref 0–53)
Alkaline Phosphatase: 112 U/L (ref 39–117)
BUN: 18 mg/dL (ref 6–23)
Calcium: 9.5 mg/dL (ref 8.4–10.5)
Potassium: 4 mEq/L (ref 3.5–5.3)
Sodium: 136 mEq/L (ref 135–145)
Total Bilirubin: 0.3 mg/dL (ref 0.3–1.2)

## 2010-10-27 LAB — CBC WITH DIFFERENTIAL/PLATELET
EOS%: 3.2 % (ref 0.0–7.0)
LYMPH%: 31.1 % (ref 14.0–49.0)
MCH: 35.4 pg — ABNORMAL HIGH (ref 27.2–33.4)
MCHC: 34.5 g/dL (ref 32.0–36.0)
MCV: 102.6 fL — ABNORMAL HIGH (ref 79.3–98.0)
MONO%: 13.1 % (ref 0.0–14.0)
Platelets: 116 10*3/uL — ABNORMAL LOW (ref 140–400)
RBC: 3.9 10*6/uL — ABNORMAL LOW (ref 4.20–5.82)
RDW: 14.1 % (ref 11.0–14.6)

## 2010-10-27 LAB — MAGNESIUM: Magnesium: 2.3 mg/dL (ref 1.5–2.5)

## 2010-10-29 ENCOUNTER — Encounter (HOSPITAL_BASED_OUTPATIENT_CLINIC_OR_DEPARTMENT_OTHER): Payer: 59 | Admitting: Oncology

## 2010-10-29 DIAGNOSIS — C180 Malignant neoplasm of cecum: Secondary | ICD-10-CM

## 2010-11-27 ENCOUNTER — Encounter (HOSPITAL_BASED_OUTPATIENT_CLINIC_OR_DEPARTMENT_OTHER): Payer: 59 | Admitting: Oncology

## 2010-11-27 ENCOUNTER — Other Ambulatory Visit: Payer: Self-pay | Admitting: Oncology

## 2010-11-27 DIAGNOSIS — C182 Malignant neoplasm of ascending colon: Secondary | ICD-10-CM

## 2010-11-27 DIAGNOSIS — Z5111 Encounter for antineoplastic chemotherapy: Secondary | ICD-10-CM

## 2010-11-27 DIAGNOSIS — D702 Other drug-induced agranulocytosis: Secondary | ICD-10-CM

## 2010-11-27 DIAGNOSIS — R109 Unspecified abdominal pain: Secondary | ICD-10-CM

## 2010-11-27 DIAGNOSIS — D6959 Other secondary thrombocytopenia: Secondary | ICD-10-CM

## 2010-11-27 LAB — CBC WITH DIFFERENTIAL/PLATELET
BASO%: 0.4 % (ref 0.0–2.0)
Basophils Absolute: 0 10*3/uL (ref 0.0–0.1)
EOS%: 2.2 % (ref 0.0–7.0)
HGB: 14 g/dL (ref 13.0–17.1)
MCH: 36.7 pg — ABNORMAL HIGH (ref 27.2–33.4)
MCHC: 35.1 g/dL (ref 32.0–36.0)
RDW: 13.1 % (ref 11.0–14.6)
lymph#: 1.7 10*3/uL (ref 0.9–3.3)

## 2010-12-03 ENCOUNTER — Encounter (INDEPENDENT_AMBULATORY_CARE_PROVIDER_SITE_OTHER): Payer: Self-pay | Admitting: Surgery

## 2010-12-05 ENCOUNTER — Encounter (INDEPENDENT_AMBULATORY_CARE_PROVIDER_SITE_OTHER): Payer: Self-pay | Admitting: Surgery

## 2010-12-05 ENCOUNTER — Ambulatory Visit (INDEPENDENT_AMBULATORY_CARE_PROVIDER_SITE_OTHER): Payer: 59 | Admitting: Surgery

## 2010-12-05 VITALS — BP 118/78 | HR 64 | Temp 97.1°F | Ht 71.0 in | Wt 235.6 lb

## 2010-12-05 DIAGNOSIS — C189 Malignant neoplasm of colon, unspecified: Secondary | ICD-10-CM

## 2010-12-05 NOTE — Progress Notes (Signed)
HPI: The patient is a 50 year old white male who is a patient of Dr. Patsy Lager of [place].  I did a right hemicolectomy on him on 03 April 2010 for a right colon cancer. His final pathology showed a 6.5 cm adenocarcinoma with 2 of 15 nodes positive. (T3N1)  He has seen Dr. Nyra Jabs who has completed his chemotherapy. Chemotherapy was completed about mid May.  His only complications with the chemotherapy is some mild tingling in his fingers and feet which were about the same.  He comes today accompanied by his wife. He is now ready to schedule his colonoscopy. He is also ready to schedule removal of his Port-A-Cath.  ROS: #1. Hypertension. #2. Hyperlipidemia.  PE: Lungs: Clear to auscultation. Abdomen: His incision is well-healed. His abdomen is soft. I feel no mass or organomegaly. Rectal: I did not do a rectal exam because of planned colonoscopy.  Data Reviewed: I have an office note from Dr. Truett Perna date 27 Oct 2010.  Assessement: #1. 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 His chamber was positive for KRAS. #2. Mild paresthesia secondary to chemotherapy. #3. Hypertension. #4. He's notice a little bit of blood in his stools. We will check this at colonoscopy.  Plan: #1. Plan completion colonoscopy. #2. Plan removal of Port-A-Cath after colonoscopy. #3. Plan 6 month followup.

## 2010-12-05 NOTE — Patient Instructions (Addendum)
1.  You are doing well with your colon cancer.  2.  Will schedule colonoscopy first.  3.  Will schedule removal of porta cath second.  4.  Otherwise, see me back in 6 months.

## 2010-12-19 ENCOUNTER — Ambulatory Visit (HOSPITAL_COMMUNITY)
Admission: RE | Admit: 2010-12-19 | Discharge: 2010-12-19 | Disposition: A | Discharge status: Home / Self Care | Payer: 59 | Source: Ambulatory Visit | Attending: Surgery | Admitting: Surgery

## 2010-12-19 ENCOUNTER — Other Ambulatory Visit (INDEPENDENT_AMBULATORY_CARE_PROVIDER_SITE_OTHER): Payer: Self-pay | Admitting: Surgery

## 2010-12-19 DIAGNOSIS — D126 Benign neoplasm of colon, unspecified: Secondary | ICD-10-CM | POA: Insufficient documentation

## 2010-12-19 DIAGNOSIS — K921 Melena: Secondary | ICD-10-CM | POA: Insufficient documentation

## 2010-12-19 DIAGNOSIS — I10 Essential (primary) hypertension: Secondary | ICD-10-CM | POA: Insufficient documentation

## 2010-12-19 DIAGNOSIS — K620 Anal polyp: Secondary | ICD-10-CM

## 2010-12-19 DIAGNOSIS — K5730 Diverticulosis of large intestine without perforation or abscess without bleeding: Secondary | ICD-10-CM | POA: Insufficient documentation

## 2010-12-19 DIAGNOSIS — K219 Gastro-esophageal reflux disease without esophagitis: Secondary | ICD-10-CM | POA: Insufficient documentation

## 2010-12-19 DIAGNOSIS — C182 Malignant neoplasm of ascending colon: Secondary | ICD-10-CM | POA: Insufficient documentation

## 2010-12-19 DIAGNOSIS — K621 Rectal polyp: Secondary | ICD-10-CM

## 2010-12-19 DIAGNOSIS — Z85038 Personal history of other malignant neoplasm of large intestine: Secondary | ICD-10-CM

## 2010-12-21 NOTE — Op Note (Signed)
  NAME:  ROGERS, DITTER NO.:  [id number]  MEDICAL RECORD NO.:  [id number]  LOCATION:  [facility]                         FACILITY:  [facility]  PHYSICIAN:  Sandria Bales. Ezzard Standing, M.D.  DATE OF BIRTH:  17-Oct-1960  DATE OF PROCEDURE:19 December 2010                              OPERATIVE REPORT   PREOPERATIVE DIAGNOSES: 1. History of stage III carcinoma of the right colon.  POSTOPERATIVE DIAGNOSIS: 1. History of stage III adenocarcinoma of the right colon, 1-cm polyp     at 90 cm from anal verge, minimal polyp at 30 cm from anal verge,     minimal diverticulosis.  PROCEDURE:  Colonoscopy with polypectomy.  SURGEON:  Sandria Bales. Ezzard Standing, M.D.  ANESTHESIA:  Fentanyl 50 mcg, Versed 4 mg.  COMPLICATIONS:  None.  INDICATIONS FOR PROCEDURE:  Mr. Conran is a 50 year old white male who is a patient of Dr. Patsy Lager at [facility].  He also sees Dr. Mancel Bale from oncology standpoint.  He had a right hemicolectomy on April 03, 2010 for T3, N1 right colon cancer.  He had 2 of 15 nodes positive.  He underwent chemotherapy supervised by Dr. Mancel Bale, has completed this.  He has never had a colonoscopy and comes for colonic evaluation.  The indications and potential complications of colonoscopy were explained to the patient.  He completed a GoLYTELY bowel prep at home.  OPERATIVE NOTE:  The patient placed in a left lateral decubitus position.  He had nasal O2 at 2 liters.  He had a blood pressure cuff, an EKG and pulse oximetry during the procedure.  He was given a total of 50 mcg of fentanyl and 5 mg of Versed.  A flexible pediatric Pentax colonoscope was passed without difficulty around to his ileocolonic anastomosis and this was at about 130 cm.  The anastomosis had a normal appearance.  At about 90 cm, I found a polyp that was little bigger than 1 cm.  I removed this with a snare but was not able to retrieve the polyp, The polyp had a benign appearance to it.  He had some  minimal sigmoid colon diverticulosis, may be 2 or 3 pockets identified.  He had a sessile polyp that was <69mm at 30 cm that I burned with hot biopsy forceps and sent this for pathology.  He had complained of a  occasional bleeding before the procedure.  I retroflexed the scope within the rectum, but I saw no internal hemorrhoids.  I saw no polyp or growth in his lower colon or rectum to explain this bleeding.  The patient tolerated the procedure well.  I did find some polyps so it would be appropriate to repeat this exam in 1 year's time, and I gave them a copy of the pictures.   Sandria Bales. Ezzard Standing, M.D., FACS   DHN/MEDQ  D:  12/19/2010  T:  12/19/2010  Job:  [id number]  cc:   Juleen China, MD Fax: [phone]  Ladene Artist, M.D. Fax: [phone]  Electronically Signed by Ovidio Kin M.D. on 12/21/2010 09:43:05 AM

## 2010-12-23 ENCOUNTER — Telehealth (INDEPENDENT_AMBULATORY_CARE_PROVIDER_SITE_OTHER): Payer: Self-pay

## 2010-12-23 NOTE — Telephone Encounter (Signed)
I called to check on patient after his procedure.  I spoke to his wife and she states no follow up is needed at this time.  Dr Ezzard Standing plans repeat colonoscopy in 1 year.  We will see him back in 6 mos.

## 2011-01-02 DIAGNOSIS — Z452 Encounter for adjustment and management of vascular access device: Secondary | ICD-10-CM

## 2011-01-05 ENCOUNTER — Telehealth (INDEPENDENT_AMBULATORY_CARE_PROVIDER_SITE_OTHER): Payer: Self-pay

## 2011-01-05 NOTE — Telephone Encounter (Signed)
I left message to check on pt and see if he plans to follow up in 6 months if he is doing well.  He can call for any concerns.

## 2011-01-12 ENCOUNTER — Telehealth: Payer: Self-pay | Admitting: Family Medicine

## 2011-01-12 NOTE — Telephone Encounter (Signed)
Fine with me

## 2011-01-12 NOTE — Telephone Encounter (Signed)
I spoke w/ patient and he scheduled an appointment for labwork and physical in October.

## 2011-01-12 NOTE — Telephone Encounter (Signed)
i saw him last October.  Ok by me for switch.
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
## 2011-01-23 ENCOUNTER — Other Ambulatory Visit: Payer: Self-pay | Admitting: Oncology

## 2011-01-23 ENCOUNTER — Encounter (HOSPITAL_BASED_OUTPATIENT_CLINIC_OR_DEPARTMENT_OTHER): Payer: 59 | Admitting: Oncology

## 2011-01-23 DIAGNOSIS — C182 Malignant neoplasm of ascending colon: Secondary | ICD-10-CM

## 2011-01-23 DIAGNOSIS — R109 Unspecified abdominal pain: Secondary | ICD-10-CM

## 2011-01-23 DIAGNOSIS — C189 Malignant neoplasm of colon, unspecified: Secondary | ICD-10-CM

## 2011-01-23 DIAGNOSIS — D6959 Other secondary thrombocytopenia: Secondary | ICD-10-CM

## 2011-01-23 DIAGNOSIS — D702 Other drug-induced agranulocytosis: Secondary | ICD-10-CM

## 2011-01-28 ENCOUNTER — Encounter: Payer: Self-pay | Admitting: Family Medicine

## 2011-03-11 ENCOUNTER — Other Ambulatory Visit: Payer: Self-pay | Admitting: Family Medicine

## 2011-03-11 DIAGNOSIS — I10 Essential (primary) hypertension: Secondary | ICD-10-CM

## 2011-03-11 DIAGNOSIS — E7800 Pure hypercholesterolemia, unspecified: Secondary | ICD-10-CM

## 2011-03-11 DIAGNOSIS — C189 Malignant neoplasm of colon, unspecified: Secondary | ICD-10-CM

## 2011-03-11 DIAGNOSIS — D7589 Other specified diseases of blood and blood-forming organs: Secondary | ICD-10-CM

## 2011-03-12 ENCOUNTER — Encounter: Payer: Self-pay | Admitting: Family Medicine

## 2011-03-12 ENCOUNTER — Other Ambulatory Visit (INDEPENDENT_AMBULATORY_CARE_PROVIDER_SITE_OTHER): Payer: 59

## 2011-03-12 DIAGNOSIS — I10 Essential (primary) hypertension: Secondary | ICD-10-CM

## 2011-03-12 DIAGNOSIS — D7589 Other specified diseases of blood and blood-forming organs: Secondary | ICD-10-CM

## 2011-03-12 DIAGNOSIS — C189 Malignant neoplasm of colon, unspecified: Secondary | ICD-10-CM

## 2011-03-12 DIAGNOSIS — E7800 Pure hypercholesterolemia, unspecified: Secondary | ICD-10-CM

## 2011-03-12 LAB — CBC WITH DIFFERENTIAL/PLATELET
Eosinophils Relative: 3.4 % (ref 0.0–5.0)
Monocytes Absolute: 0.5 10*3/uL (ref 0.1–1.0)
Monocytes Relative: 8.4 % (ref 3.0–12.0)
Neutrophils Relative %: 42.1 % — ABNORMAL LOW (ref 43.0–77.0)
Platelets: 230 10*3/uL (ref 150.0–400.0)
WBC: 6.2 10*3/uL (ref 4.5–10.5)

## 2011-03-12 LAB — LIPID PANEL
Cholesterol: 141 mg/dL (ref 0–200)
Triglycerides: 92 mg/dL (ref 0.0–149.0)

## 2011-03-12 LAB — COMPREHENSIVE METABOLIC PANEL
BUN: 19 mg/dL (ref 6–23)
CO2: 28 mEq/L (ref 19–32)
Calcium: 9.2 mg/dL (ref 8.4–10.5)
Chloride: 105 mEq/L (ref 96–112)
Creatinine, Ser: 1.3 mg/dL (ref 0.4–1.5)
GFR: 60.95 mL/min (ref >60.00)

## 2011-03-12 LAB — VITAMIN B12: Vitamin B-12: 257 pg/mL (ref 211–911)

## 2011-03-13 ENCOUNTER — Encounter: Payer: Self-pay | Admitting: Family Medicine

## 2011-03-13 ENCOUNTER — Ambulatory Visit (INDEPENDENT_AMBULATORY_CARE_PROVIDER_SITE_OTHER): Payer: 59 | Admitting: Family Medicine

## 2011-03-13 VITALS — BP 118/76 | HR 64 | Temp 97.8°F | Ht 71.0 in | Wt 235.0 lb

## 2011-03-13 DIAGNOSIS — C189 Malignant neoplasm of colon, unspecified: Secondary | ICD-10-CM

## 2011-03-13 DIAGNOSIS — R5383 Other fatigue: Secondary | ICD-10-CM

## 2011-03-13 DIAGNOSIS — R5381 Other malaise: Secondary | ICD-10-CM

## 2011-03-13 DIAGNOSIS — K625 Hemorrhage of anus and rectum: Secondary | ICD-10-CM

## 2011-03-13 DIAGNOSIS — Z0000 Encounter for general adult medical examination without abnormal findings: Secondary | ICD-10-CM
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
## 2011-03-13 NOTE — Patient Instructions (Signed)
For blood in stool - try to ensure soft stools.  May be irritation on inside that is getting flared every time you have hard stool. I'd recommend stool softener daily (colace or docusate 100mg  once or twice a day).  Ensure you are getting plenty of fluid and fiber. I've added b12 to blood in lab. We will call you with results of blood work.  I will mail you a copy as well. Good to see you today, call us with questions.

## 2011-03-14 ENCOUNTER — Encounter: Payer: Self-pay | Admitting: Family Medicine

## 2011-03-14 DIAGNOSIS — Z0000 Encounter for general adult medical examination without abnormal findings: Secondary | ICD-10-CM | POA: Insufficient documentation

## 2011-03-14 DIAGNOSIS — K602 Anal fissure, unspecified: Secondary | ICD-10-CM | POA: Insufficient documentation

## 2011-03-14 NOTE — Assessment & Plan Note (Signed)
No cause for bleeding found on colonoscopy this year. Story sounds consistent with anal fissure or bleeding hemorrhoid (worse when constipated).  Alternatively could be irritation along colon lining that is not healing 2/2 intermittent hard stools. Will try daily stool softener (colace) and push fluids.  If not better, update [place].

## 2011-03-14 NOTE — Assessment & Plan Note (Signed)
Reviewed preventative protocols, updated immunizations. Reviewed colonoscopy 12/2010, rpt due 1 year. Deferred PSA/DRE as pt states will have done at cancer center this year. Blood work drawn 10/4 but still not back yet. Pt asks we mail him a copy.

## 2011-03-14 NOTE — Assessment & Plan Note (Addendum)
S/p hemicolectomy and chemotherapy, completed 10/2010 To f/u with surgery 06/2010 and has CT scan coming up in next month. Rpt colonoscopy due 12/2011

## 2011-03-16 ENCOUNTER — Other Ambulatory Visit: Payer: Self-pay | Admitting: Family Medicine

## 2011-04-02 ENCOUNTER — Encounter: Payer: Self-pay | Admitting: *Deleted

## 2011-04-20 ENCOUNTER — Ambulatory Visit (HOSPITAL_COMMUNITY)
Admission: RE | Admit: 2011-04-20 | Discharge: 2011-04-20 | Disposition: A | Discharge status: Home / Self Care | Payer: 59 | Source: Ambulatory Visit | Attending: Oncology | Admitting: Oncology

## 2011-04-20 ENCOUNTER — Other Ambulatory Visit (HOSPITAL_BASED_OUTPATIENT_CLINIC_OR_DEPARTMENT_OTHER): Payer: 59 | Admitting: Lab

## 2011-04-20 ENCOUNTER — Other Ambulatory Visit: Payer: Self-pay | Admitting: Oncology

## 2011-04-20 DIAGNOSIS — C189 Malignant neoplasm of colon, unspecified: Secondary | ICD-10-CM | POA: Insufficient documentation

## 2011-04-20 DIAGNOSIS — K7689 Other specified diseases of liver: Secondary | ICD-10-CM | POA: Insufficient documentation

## 2011-04-20 DIAGNOSIS — Z9221 Personal history of antineoplastic chemotherapy: Secondary | ICD-10-CM | POA: Insufficient documentation

## 2011-04-20 DIAGNOSIS — C182 Malignant neoplasm of ascending colon: Secondary | ICD-10-CM

## 2011-04-20 LAB — BASIC METABOLIC PANEL - CANCER CENTER ONLY
BUN, Bld: 23 mg/dL — ABNORMAL HIGH (ref 7–22)
Calcium: 9.2 mg/dL (ref 8.0–10.3)
Chloride: 104 mEq/L (ref 98–108)
Creat: 1.1 mg/dl (ref 0.6–1.2)

## 2011-04-20 LAB — CEA: CEA: <0.5 ng/mL (ref 0.0–5.0)

## 2011-04-20 MED ORDER — IOHEXOL 300 MG/ML  SOLN
100.0000 mL | Freq: Once | INTRAMUSCULAR | Status: AC | PRN
  Administered 2011-04-20: 100 mL via INTRAVENOUS

## 2011-04-23 ENCOUNTER — Encounter: Payer: Self-pay | Admitting: *Deleted

## 2011-04-23 ENCOUNTER — Ambulatory Visit (HOSPITAL_BASED_OUTPATIENT_CLINIC_OR_DEPARTMENT_OTHER): Payer: 59 | Admitting: Oncology

## 2011-04-23 ENCOUNTER — Telehealth: Payer: Self-pay | Admitting: Oncology

## 2011-04-23 VITALS — BP 113/72 | HR 69 | Temp 97.0°F | Ht 71.5 in | Wt 237.1 lb

## 2011-04-23 DIAGNOSIS — C189 Malignant neoplasm of colon, unspecified: Secondary | ICD-10-CM

## 2011-04-23 DIAGNOSIS — D6959 Other secondary thrombocytopenia: Secondary | ICD-10-CM

## 2011-04-23 DIAGNOSIS — D708 Other neutropenia: Secondary | ICD-10-CM

## 2011-04-23 DIAGNOSIS — C182 Malignant neoplasm of ascending colon: Secondary | ICD-10-CM

## 2011-04-23 DIAGNOSIS — I10 Essential (primary) hypertension: Secondary | ICD-10-CM

## 2011-04-23 NOTE — Progress Notes (Signed)
Research note:CTSU [id number],  Bryan Fields is in the cancer center today for physical exam 6 months after completing chemotherapy with FOLFOX  And blinded  IV calcium/magnesium to prevent oxaliplatin induced sensory neuropathy. He completed the after chemotherapy patient questionnaire. He is not experiencing any oxaliplatin specific neurotoxicity. He does report mild paresthesia in his toes and fingers. Function is not affected. He is working full time. ECOG PS is 0.  He will return to see Dr. Truett Perna in 6 months.

## 2011-04-23 NOTE — Progress Notes (Signed)
OFFICE PROGRESS NOTE   INTERVAL HISTORY:   Mr. Karge returns as scheduled. He feels well in general. He reports "80%" improvement in the neuropathy symptoms at the hands. He continues to have numbness and tingling at the left foot. The neuropathy symptoms do not interfere with activity.  Objective:  Vital signs in last 24 hours:  Blood pressure 113/72, pulse 69, temperature 97 F (36.1 C), temperature source Oral, height 5' 11.5" (1.816 m), weight 237 lb 1.6 oz (107.548 kg).    HEENT: Neck without mass Lymphatics: No cervical, supraclavicular, axillary, or inguinal node Resp: Lungs clear bilateral Cardio: Regular rate and rhythm GI:  No hepatosplenomegaly. No mass. Vascular: No leg edema Neuro: Sensation is intact to light touch at the left foot.       Lab Results:  CBC  Lab Results  Component Value Date   WBC 6.2 03/12/2011   HGB 15.8 03/12/2011   HCT 46.7 03/12/2011   MCV 98.1 03/12/2011   PLT 230.0 03/12/2011    Chemistry:      Component Value Date/Time   NA 147* 04/20/2011 1504   NA 140 03/12/2011 0817   K 4.2 04/20/2011 1504   K 4.4 03/12/2011 0817   CL 104 04/20/2011 1504   CL 105 03/12/2011 0817   CO2 29 04/20/2011 1504   CO2 28 03/12/2011 0817   GLUCOSE 90 04/20/2011 1504   GLUCOSE 106* 03/12/2011 0817   BUN 23* 04/20/2011 1504   BUN 19 03/12/2011 0817   CREATININE 1.1 04/20/2011 1504   CREATININE 1.3 03/12/2011 0817   CALCIUM 9.2 04/20/2011 1504   CALCIUM 9.2 03/12/2011 0817   PROT 7.8 03/12/2011 0817   ALBUMIN 4.5 03/12/2011 0817   AST 32 03/12/2011 0817   ALT 40 03/12/2011 0817   ALKPHOS 64 03/12/2011 0817   BILITOT 0.7 03/12/2011 0817   GFRNONAA >60 04/04/2010 0403   GFRAA  Value: >60        The eGFR has been calculated using the MDRD equation. This calculation has not been validated in all clinical situations. eGFR's persistently <60 mL/min signify possible Chronic Kidney Disease. 04/04/2010 0403    11/12-CEA <.5  Studies/Results: A restaging CT of  the chest, abdomen, and pelvis on November 12 was compared to a CT from 04/01/2010. No chest lymphadenopathy. No pleural or pericardial effusion. No lung mass. There is a stable less than 1 cm low attenuation lesion in the medial left liver consistent with a cyst. No liver mass. No lymphadenopathy in the abdomen or pelvis.   Medications: I have reviewed the patient's current medications.  Assessment/Plan: 1. 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  He began treatment with FOLFOX in December 2011.  He completed cycle #12 on 10/27/2010.  Oxaliplatin was held with cycles number 7 through 10 and resumed with cycle #11. 2. History of neutropenia secondary to chemotherapy. 3. History of thrombocytopenia secondary chemotherapy. 4. He did not undergo a preoperative colonoscopy.  He has been referred for a complete colonoscopy. 5. 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  He began study drug on 05/13/2010. 6. Status post Port-A-Cath placement. The Port-A-Cath has been removed. 7. 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  Resolved.  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  This does not interfere with activity. The neuropathy  symptoms at the fingers have improved significantly.   Disposition:  Bryan Fields remains in clinical remission from colon cancer. We will followup on the recent colonoscopy report. He will return for an office visit and CEA in 6 months. I am hopeful the neuropathy symptoms will improve over the next several months.   Lucile Shutters, MD  04/23/2011  1:34 PM

## 2011-04-23 NOTE — Telephone Encounter (Signed)
gve the pt his may 2013 appts

## 2011-05-21 ENCOUNTER — Encounter (INDEPENDENT_AMBULATORY_CARE_PROVIDER_SITE_OTHER): Payer: Self-pay | Admitting: Surgery

## 2011-07-03 ENCOUNTER — Ambulatory Visit (INDEPENDENT_AMBULATORY_CARE_PROVIDER_SITE_OTHER): Payer: 59 | Admitting: Surgery

## 2011-07-30 ENCOUNTER — Encounter (INDEPENDENT_AMBULATORY_CARE_PROVIDER_SITE_OTHER): Payer: Self-pay | Admitting: Surgery

## 2011-07-30 ENCOUNTER — Ambulatory Visit (INDEPENDENT_AMBULATORY_CARE_PROVIDER_SITE_OTHER): Payer: 59 | Admitting: Surgery

## 2011-07-30 VITALS — BP 122/78 | HR 62 | Temp 98.0°F | Ht 71.0 in | Wt 239.0 lb

## 2011-07-30 DIAGNOSIS — C189 Malignant neoplasm of colon, unspecified: Secondary | ICD-10-CM

## 2011-07-30 NOTE — Progress Notes (Signed)
[facility]  Bryan Kin, MD,  FACS [address]  [address] [place], [place]    [address] Phone:  [phone] FAX:  [phone]   Re:   Bryan Fields DOB:   Feb 09, 1961 MRN:   [id number]  ASSESSMENT AND PLAN: 1. 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  T3, N1 (2/15 nodes)  Right hemicolectomy - 04/03/2010.  His cancer was positive for KRAS. He received FOLFOX and oxaliplatin.  Sees Dr. Leonard Schwartz. [place] in May 2013.  He is disease free and will see me back in 6 months.  2. Paresthesia secondary to chemotherapy. Primarily involving his left leg. 3. Hypertension.  4. Last colonoscopy. - 12/19/2010.  He had several polyps, though benign, I will plan repeat the colonoscopy this summer  HISTORY OF PRESENT ILLNESS: Chief Complaint  Patient presents with  . Follow-up    colon sx    Bryan Fields is a 51 y.o. (DOB: 1960/09/25)  white male who is a patient of Eustaquio Boyden, MD, MD and comes to me today for right colon cancer.  He is eating well. His weight is stable.  He has noticed occasional blood in his stool.  He has noticed no mass.  He has no concerns. He still has some of parethesias, particularly his left leg.  PHYSICAL EXAM: BP 122/78  Pulse 62  Temp(Src) 98 F (36.7 C) (Temporal)  Ht 5\' 11"  (1.803 m)  Wt 239 lb (108.41 kg)  BMI 33.33 kg/m2  SpO2 95%  General: WN mildly overweight WM who is alert and generally healthy appearing.  HEENT: Normal. Pupils equal. Good dentition. Neck: Supple. No mass.  No thyroid mass.  Carotid pulse okay with no bruit. Lymph Nodes:  No supraclavicular or cervical nodes. Lungs: Clear to auscultation and symmetric breath sounds. Heart:  RRR. No murmur or rub.  Abdomen: Soft. No mass. No tenderness. Normal bowel sounds.  His incision looks good, no hernia. Rectal: Not done, since I plan a colonoscopy later this year. Extremities:  Good strength and ROM  in upper and lower extremities. Neurologic:   Grossly intact to motor and sensory function. Psychiatric: Has normal mood and affect. Behavior is normal.   DATA REVIEWED: Last CEA 04/20/2011 - <0.5  Bryan Kin, MD, FACS Office:  [phone]

## 2011-10-22 ENCOUNTER — Other Ambulatory Visit (HOSPITAL_BASED_OUTPATIENT_CLINIC_OR_DEPARTMENT_OTHER): Payer: 59 | Admitting: Lab

## 2011-10-22 ENCOUNTER — Ambulatory Visit: Payer: 59 | Admitting: Oncology

## 2011-10-22 DIAGNOSIS — C189 Malignant neoplasm of colon, unspecified: Secondary | ICD-10-CM

## 2011-10-27 ENCOUNTER — Other Ambulatory Visit: Payer: Self-pay | Admitting: *Deleted

## 2011-10-27 ENCOUNTER — Telehealth: Payer: Self-pay | Admitting: Oncology

## 2011-10-27 NOTE — Telephone Encounter (Signed)
Moved 5/23 appt to LT per 5/21 pof. lmonvm for pt re change w/new time for 2:45 pm.

## 2011-10-29 ENCOUNTER — Ambulatory Visit: Payer: 59 | Admitting: Oncology

## 2011-10-29 ENCOUNTER — Telehealth: Payer: Self-pay | Admitting: Oncology

## 2011-10-29 ENCOUNTER — Ambulatory Visit (HOSPITAL_BASED_OUTPATIENT_CLINIC_OR_DEPARTMENT_OTHER): Payer: 59 | Admitting: Nurse Practitioner

## 2011-10-29 VITALS — BP 115/74 | HR 64 | Temp 98.5°F | Ht 71.0 in | Wt 236.9 lb

## 2011-10-29 DIAGNOSIS — C189 Malignant neoplasm of colon, unspecified: Secondary | ICD-10-CM

## 2011-10-29 DIAGNOSIS — G620 Drug-induced polyneuropathy: Secondary | ICD-10-CM

## 2011-10-29 DIAGNOSIS — T451X5A Adverse effect of antineoplastic and immunosuppressive drugs, initial encounter: Secondary | ICD-10-CM

## 2011-10-29 NOTE — Progress Notes (Signed)
OFFICE PROGRESS NOTE  Interval history:  [name] returns as scheduled. He overall feels well. He has a good appetite and good energy level. No weight loss. 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 He has occasional pain at the left lower quadrant. No change in bowel habits. No shortness of breath.   Objective: Blood pressure 115/74, pulse 64, temperature 98.5 F (36.9 C), temperature source Oral, height 5\' 11"  (1.803 m), weight 236 lb 14.4 oz (107.457 kg).  Oropharynx is without thrush or ulceration. No palpable cervical, supraclavicular, axillary or inguinal lymph nodes. Lungs are clear. Regular cardiac rhythm. Abdomen is soft and nontender. No organomegaly. Extremities are without edema.  Lab Results: Lab Results  Component Value Date   WBC 6.2 03/12/2011   HGB 15.8 03/12/2011   HCT 46.7 03/12/2011   MCV 98.1 03/12/2011   PLT 230.0 03/12/2011    Chemistry:    Chemistry      Component Value Date/Time   NA 147* 04/20/2011 1504   NA 140 03/12/2011 0817   K 4.2 04/20/2011 1504   K 4.4 03/12/2011 0817   CL 104 04/20/2011 1504   CL 105 03/12/2011 0817   CO2 29 04/20/2011 1504   CO2 28 03/12/2011 0817   BUN 23* 04/20/2011 1504   BUN 19 03/12/2011 0817   CREATININE 1.1 04/20/2011 1504   CREATININE 1.3 03/12/2011 0817      Component Value Date/Time   CALCIUM 9.2 04/20/2011 1504   CALCIUM 9.2 03/12/2011 0817   ALKPHOS 64 03/12/2011 0817   AST 32 03/12/2011 0817   ALT 40 03/12/2011 0817   BILITOT 0.7 03/12/2011 0817     10/22/2011 CEA less than 0.5.  Studies/Results: No results found.  Medications: I have reviewed the patient's current medications.  Assessment/Plan:  1. 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 He began treatment with FOLFOX in December 2011. He completed cycle #12 on 10/27/2010. Oxaliplatin was held with cycles number 7 through 10 and resumed with cycle  #11. 2. History of neutropenia secondary to chemotherapy. 3. History of thrombocytopenia secondary chemotherapy. 4. He did not undergo a preoperative colonoscopy. He underwent a colonoscopy on 12/19/2010 with findings of a 1 cm polyp at 90 cm from the anal verge, minimal polyp at 30 cm from the anal verge and minimal diverticulosis. 5. 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 He began study drug on 05/13/2010. 6. Status post Port-A-Cath placement. The Port-A-Cath has been removed. 7. 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  8. Oxaliplatin neuropathy. 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  Disposition-[name] appears stable. He remains in remission from the colon cancer. He will return for a followup visit in 6 months with CT scans and a CEA 1 week prior. He will contact the office in the interim with any problems.  Plan reviewed with Dr. Truett Perna.  Lonna Cobb ANP/GNP-BC

## 2011-10-29 NOTE — Telephone Encounter (Signed)
Gv pt appt for nov2013 °

## 2012-03-04 ENCOUNTER — Other Ambulatory Visit: Payer: Self-pay | Admitting: Family Medicine

## 2012-03-04 ENCOUNTER — Ambulatory Visit (INDEPENDENT_AMBULATORY_CARE_PROVIDER_SITE_OTHER): Payer: 59 | Admitting: Surgery

## 2012-03-04 ENCOUNTER — Encounter (INDEPENDENT_AMBULATORY_CARE_PROVIDER_SITE_OTHER): Payer: Self-pay | Admitting: Surgery

## 2012-03-04 ENCOUNTER — Other Ambulatory Visit (INDEPENDENT_AMBULATORY_CARE_PROVIDER_SITE_OTHER): Payer: Self-pay | Admitting: Surgery

## 2012-03-04 VITALS — BP 132/86 | HR 64 | Temp 98.2°F | Resp 16 | Ht 71.0 in | Wt 234.2 lb

## 2012-03-04 DIAGNOSIS — C189 Malignant neoplasm of colon, unspecified: Secondary | ICD-10-CM

## 2012-03-04 NOTE — Progress Notes (Signed)
[facility]  Ovidio Kin, MD,  FACS [address] [place]  [address] [place], [place]    [address] Phone:  [phone] FAX:  [phone]   Re:   Bryan Fields DOB:   18-Jun-1960 MRN:   [id number]  ASSESSMENT AND PLAN: 1. 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  T3, N1 (2/15 nodes)  Right hemicolectomy - 04/03/2010.  His cancer was positive for KRAS. He received FOLFOX and oxaliplatin.  Sees Dr. Leonard Schwartz. Sherrill.  He is disease free and will see me back in 6 months.  2. Paresthesia secondary to chemotherapy.   Primarily involving his left leg. These seem to be a little better. 3. Hypertension.  4. Last colonoscopy. - 12/19/2010.  He had several polyps, though benign, I will plan repeat the colonoscopy.  Patient given bowel prep. 5. He does notice occasional blood in his stools.  We will address this when I do the colonoscopy.  HISTORY OF PRESENT ILLNESS: Chief Complaint  Patient presents with  . Follow-up    colon ca f/u   RUMI TARAS is a 51 y.o. (DOB: 1960/11/25)  white male who is a patient of Eustaquio Boyden, MD and comes to me today for follow up of a right colon cancer.  He is eating well. His weight is stable.  He has no concerns. He gets repeat CT scans in November, 2013. He still has some of parethesias, particularly his left leg, but these are slowly getting better.  PHYSICAL EXAM: BP 132/86  Pulse 64  Temp 98.2 F (36.8 C) (Temporal)  Resp 16  Ht 5\' 11"  (1.803 m)  Wt 234 lb 3.2 oz (106.232 kg)  BMI 32.66 kg/m2  General: WN mildly overweight WM who is alert and generally healthy appearing.  HEENT: Normal. Pupils equal. Good dentition. Neck: Supple. No mass.  No thyroid mass.  Carotid pulse okay with no bruit. Lymph Nodes:  No supraclavicular or cervical nodes. Lungs: Clear to auscultation and symmetric breath sounds. Heart:  RRR. No murmur or rub.  Abdomen: Soft. No mass. No tenderness. Normal bowel sounds.  His incision  looks good, no hernia. Rectal: Not done, since I plan a colonoscopy in the next few weeks.. Extremities:  Good strength and ROM  in upper and lower extremities. Neurologic:  Grossly intact to motor and sensory function. Psychiatric: Has normal mood and affect. Behavior is normal.   DATA REVIEWED: Last CEA -  10/22/2011 - <0.5  Ovidio Kin, MD, FACS Office:  [phone]

## 2012-03-06 ENCOUNTER — Other Ambulatory Visit: Payer: Self-pay | Admitting: Family Medicine

## 2012-03-07 ENCOUNTER — Other Ambulatory Visit: Payer: Self-pay | Admitting: Family Medicine

## 2012-03-07 DIAGNOSIS — I10 Essential (primary) hypertension: Secondary | ICD-10-CM

## 2012-03-07 DIAGNOSIS — D7589 Other specified diseases of blood and blood-forming organs: Secondary | ICD-10-CM

## 2012-03-07 DIAGNOSIS — E7800 Pure hypercholesterolemia, unspecified: Secondary | ICD-10-CM

## 2012-03-11 ENCOUNTER — Other Ambulatory Visit (INDEPENDENT_AMBULATORY_CARE_PROVIDER_SITE_OTHER): Payer: 59

## 2012-03-11 DIAGNOSIS — E7800 Pure hypercholesterolemia, unspecified: Secondary | ICD-10-CM

## 2012-03-11 DIAGNOSIS — D7589 Other specified diseases of blood and blood-forming organs: Secondary | ICD-10-CM

## 2012-03-11 LAB — CBC WITH DIFFERENTIAL/PLATELET
Basophils Relative: 0.7 % (ref 0.0–3.0)
Eosinophils Absolute: 0.1 10*3/uL (ref 0.0–0.7)
MCHC: 33.3 g/dL (ref 30.0–36.0)
MCV: 97.7 fl (ref 78.0–100.0)
Monocytes Absolute: 0.4 10*3/uL (ref 0.1–1.0)
Neutrophils Relative %: 42.7 % — ABNORMAL LOW (ref 43.0–77.0)
Platelets: 253 10*3/uL (ref 150.0–400.0)

## 2012-03-11 LAB — LIPID PANEL
LDL Cholesterol: 58 mg/dL (ref 0–99)
VLDL: 19 mg/dL (ref 0.0–40.0)

## 2012-03-11 LAB — COMPREHENSIVE METABOLIC PANEL
ALT: 27 U/L (ref 0–53)
AST: 27 U/L (ref 0–37)
Albumin: 4.2 g/dL (ref 3.5–5.2)
Alkaline Phosphatase: 48 U/L (ref 39–117)
Potassium: 4.5 mEq/L (ref 3.5–5.1)
Sodium: 138 mEq/L (ref 135–145)
Total Protein: 7.4 g/dL (ref 6.0–8.3)

## 2012-03-17 ENCOUNTER — Encounter: Payer: 59 | Admitting: Family Medicine

## 2012-03-18 ENCOUNTER — Encounter (HOSPITAL_COMMUNITY)
Admission: RE | Disposition: A | Discharge status: Home / Self Care | Payer: Self-pay | Source: Ambulatory Visit | Attending: Surgery

## 2012-03-18 ENCOUNTER — Encounter (HOSPITAL_COMMUNITY): Payer: Self-pay | Admitting: *Deleted

## 2012-03-18 ENCOUNTER — Ambulatory Visit (HOSPITAL_COMMUNITY)
Admission: RE | Admit: 2012-03-18 | Discharge: 2012-03-18 | Disposition: A | Discharge status: Home / Self Care | Payer: 59 | Source: Ambulatory Visit | Attending: Surgery | Admitting: Surgery

## 2012-03-18 DIAGNOSIS — T50905S Adverse effect of unspecified drugs, medicaments and biological substances, sequela: Secondary | ICD-10-CM | POA: Insufficient documentation

## 2012-03-18 DIAGNOSIS — K5730 Diverticulosis of large intestine without perforation or abscess without bleeding: Secondary | ICD-10-CM

## 2012-03-18 DIAGNOSIS — Z860100 Personal history of colon polyps, unspecified: Secondary | ICD-10-CM | POA: Insufficient documentation

## 2012-03-18 DIAGNOSIS — T451X5A Adverse effect of antineoplastic and immunosuppressive drugs, initial encounter: Secondary | ICD-10-CM | POA: Insufficient documentation

## 2012-03-18 DIAGNOSIS — I10 Essential (primary) hypertension: Secondary | ICD-10-CM | POA: Insufficient documentation

## 2012-03-18 DIAGNOSIS — C189 Malignant neoplasm of colon, unspecified: Secondary | ICD-10-CM

## 2012-03-18 DIAGNOSIS — Z85038 Personal history of other malignant neoplasm of large intestine: Secondary | ICD-10-CM | POA: Insufficient documentation

## 2012-03-18 DIAGNOSIS — R209 Unspecified disturbances of skin sensation: Secondary | ICD-10-CM | POA: Insufficient documentation

## 2012-03-18 DIAGNOSIS — K921 Melena: Secondary | ICD-10-CM | POA: Insufficient documentation

## 2012-03-18 DIAGNOSIS — Z980 Intestinal bypass and anastomosis status: Secondary | ICD-10-CM | POA: Insufficient documentation

## 2012-03-18 HISTORY — PX: COLONOSCOPY: SHX5424

## 2012-03-18 SURGERY — COLONOSCOPY
Anesthesia: Moderate Sedation

## 2012-03-18 MED ORDER — FENTANYL CITRATE 0.05 MG/ML IJ SOLN
INTRAMUSCULAR | Status: AC
  Filled 2012-03-18: qty 4

## 2012-03-18 MED ORDER — MIDAZOLAM HCL 10 MG/2ML IJ SOLN
INTRAMUSCULAR | Status: AC
  Filled 2012-03-18: qty 4

## 2012-03-18 MED ORDER — FENTANYL CITRATE 0.05 MG/ML IJ SOLN
INTRAMUSCULAR | Status: DC | PRN
  Administered 2012-03-18 (×3): 25 ug via INTRAVENOUS

## 2012-03-18 MED ORDER — MIDAZOLAM HCL 5 MG/5ML IJ SOLN
INTRAMUSCULAR | Status: DC | PRN
  Administered 2012-03-18: 1 mg via INTRAVENOUS
  Administered 2012-03-18 (×2): 2 mg via INTRAVENOUS

## 2012-03-18 MED ORDER — SODIUM CHLORIDE 0.9 % IV SOLN
INTRAVENOUS | Status: DC
  Administered 2012-03-18: 500 mL via INTRAVENOUS

## 2012-03-18 NOTE — Op Note (Signed)
03/18/2012  8:07 AM  PATIENT:  Bryan Fields, 51 y.o., male, MRN: [id number]  PREOP DIAGNOSIS:  history of colon cancer and polyps  POSTOP DIAGNOSIS:   Normal ileo-colonic anastomosis, scattered sigmoid colon diverticula  PROCEDURE:   Procedure(s): COLONOSCOPY  SURGEON:   Ovidio Kin, M.D.   ANESTHESIA:   IV sedation  Moderate Sedation  75 mcgm Fentanyl, 5 mg versed  SPECIMEN:   None  INDICATIONS FOR PROCEDURE:  GEROGE GILLIAM is a 51 y.o. (DOB: Oct 21, 1960) white male whose primary care physician is Eustaquio Boyden, MD and comes for colonoscopy with history of right colon cancer.   The indications and risks of colonoscopy were explained to the patient.  The risks include, but are not limited to, perforation of the bowel and bleeding.  OPERATIVE NOTE:  The patient was taken to room # 3 in the [facility] endoscopy suite.  The patient was monitored with pulse oximetry, blood pressure cuff, and cardiac monitor.  The had 2 liters of nasal O2 during the procedure.  A time out was held and the checklist reviewed.   The patient was sedated with 75 mcgm of Fentanyl and 5 mgm of Versed.   A digital rectal exam was done at the beginning of the procedure..  The anus and rectum were unremarkable.   He has a history of rectal bleeding, but I see nothing around the anus.   The flexible Pentax colonoscope was passed up the rectum without difficulty.  The scope was advanced to the right transverse colon and the ileo-colonic anastomosis was identified at 130 cm.  There was a suture at the anastomosis, but the anastomosis looked normal.  The colonic prep was good with some thin fluid in the colon.   The transverse colon, left colon, and sigmoid colon were unremarkable.  He did have some scattered diverticula in the sigmoid colon.     The scope was withdrawn into the rectum and retroflexed.  Again, I saw no reason for his occasional rectal bleeding.  The rectum was unremarkable.  Photos were taken  during the procedure and placed in the chart.   The patient was taken to the recovery area of the [facility] endoscopy in good condition.  His wife was with the patient and I explained the findings of the procedure.   The patient's next colonoscopy should be in 3 years.  Ovidio Kin, MD, [facility] Pager: [phone] Office phone:  [phone]

## 2012-03-18 NOTE — Interval H&P Note (Signed)
History and Physical Interval Note:  03/18/2012 7:30 AM  Bryan Fields  has presented today for surgery, with the diagnosis of history of colon cancer and polyps  The various methods of treatment have been discussed with the patient and family.  Wife at bedside.  After consideration of risks, benefits and other options for treatment, the patient has consented to  Procedure(s) (LRB) with comments: COLONOSCOPY (N/A) as a surgical intervention .    The patient's history has been reviewed, patient examined, no change in status, stable for surgery.  I have reviewed the patient's chart and labs.  Questions were answered to the patient's satisfaction.     Rya Rausch H

## 2012-03-18 NOTE — H&P (View-Only) (Signed)
[facility]  Clement Deneault, MD,  FACS [address]  [address] [place], [place]    [address] Phone:  [phone] FAX:  [phone]   Re:   Bryan Fields DOB:   11/30/1960 MRN:   [id number]  ASSESSMENT AND PLAN: 1. 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  T3, N1 (2/15 nodes)  Right hemicolectomy - 04/03/2010.  His cancer was positive for KRAS. He received FOLFOX and oxaliplatin.  Sees Dr. B. Sherrill.  He is disease free and will see me back in 6 months.  2. Paresthesia secondary to chemotherapy.   Primarily involving his left leg. These seem to be a little better. 3. Hypertension.  4. Last colonoscopy. - 12/19/2010.  He had several polyps, though benign, I will plan repeat the colonoscopy.  Patient given bowel prep. 5. He does notice occasional blood in his stools.  We will address this when I do the colonoscopy.  HISTORY OF PRESENT ILLNESS: Chief Complaint  Patient presents with  . Follow-up    colon ca f/u   Bryan Fields is a 51 y.o. (DOB: 03/06/1961)  white male who is a patient of Javier Gutierrez, MD and comes to me today for follow up of a right colon cancer.  He is eating well. His weight is stable.  He has no concerns. He gets repeat CT scans in November, 2013. He still has some of parethesias, particularly his left leg, but these are slowly getting better.  PHYSICAL EXAM: BP 132/86  Pulse 64  Temp 98.2 F (36.8 C) (Temporal)  Resp 16  Ht 5' 11" (1.803 m)  Wt 234 lb 3.2 oz (106.232 kg)  BMI 32.66 kg/m2  General: WN mildly overweight WM who is alert and generally healthy appearing.  HEENT: Normal. Pupils equal. Good dentition. Neck: Supple. No mass.  No thyroid mass.  Carotid pulse okay with no bruit. Lymph Nodes:  No supraclavicular or cervical nodes. Lungs: Clear to auscultation and symmetric breath sounds. Heart:  RRR. No murmur or rub.  Abdomen: Soft. No mass. No tenderness. Normal bowel sounds.  His incision  looks good, no hernia. Rectal: Not done, since I plan a colonoscopy in the next few weeks.. Extremities:  Good strength and ROM  in upper and lower extremities. Neurologic:  Grossly intact to motor and sensory function. Psychiatric: Has normal mood and affect. Behavior is normal.   DATA REVIEWED: Last CEA -  10/22/2011 - <0.5  Alzina Golda, MD, FACS Office:  [phone]  

## 2012-03-19 ENCOUNTER — Encounter: Payer: Self-pay | Admitting: Family Medicine

## 2012-03-21 ENCOUNTER — Ambulatory Visit (INDEPENDENT_AMBULATORY_CARE_PROVIDER_SITE_OTHER): Payer: 59 | Admitting: Family Medicine

## 2012-03-21 ENCOUNTER — Encounter (HOSPITAL_COMMUNITY): Payer: Self-pay

## 2012-03-21 ENCOUNTER — Encounter (HOSPITAL_COMMUNITY): Payer: Self-pay | Admitting: Surgery

## 2012-03-21 VITALS — BP 122/78 | HR 68 | Temp 98.2°F | Ht 71.0 in | Wt 238.8 lb

## 2012-03-21 DIAGNOSIS — I10 Essential (primary) hypertension: Secondary | ICD-10-CM

## 2012-03-21 DIAGNOSIS — E7800 Pure hypercholesterolemia, unspecified: Secondary | ICD-10-CM

## 2012-03-21 DIAGNOSIS — C189 Malignant neoplasm of colon, unspecified: Secondary | ICD-10-CM

## 2012-03-21 DIAGNOSIS — Z0000 Encounter for general adult medical examination without abnormal findings: Secondary | ICD-10-CM

## 2012-03-21 DIAGNOSIS — R7309 Other abnormal glucose: Secondary | ICD-10-CM

## 2012-03-21 NOTE — Assessment & Plan Note (Signed)
Again discussed this.  Discussed dietary changes.

## 2012-03-21 NOTE — Assessment & Plan Note (Signed)
Chronic, stable. Consider decreasing simvastatin in future.

## 2012-03-21 NOTE — Assessment & Plan Note (Signed)
Preventative protocols reviewed and updated unless pt declined. Discussed healthy diet and lifestyle. To get flu at work.

## 2012-03-21 NOTE — Patient Instructions (Addendum)
Try colace regularly for softer stools. Work on decreased added sugars and decreasing white carbs/starches. Good to see you today, call us with questions. Try Dr. Terri Piedra or Margo Aye for dermatologist.

## 2012-03-21 NOTE — Assessment & Plan Note (Signed)
Chronic stable continue meds. BP Readings from Last 3 Encounters:  03/21/12 122/78  03/18/12 126/85  03/18/12 126/85
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
## 2012-03-21 NOTE — Assessment & Plan Note (Signed)
Stable from this standpoint.  Recent colonoscopy reassuring.  No cause for bleed found.  Pt will continue to monitor

## 2012-04-07 ENCOUNTER — Other Ambulatory Visit: Payer: Self-pay | Admitting: Family Medicine

## 2012-04-11 ENCOUNTER — Other Ambulatory Visit (HOSPITAL_BASED_OUTPATIENT_CLINIC_OR_DEPARTMENT_OTHER): Payer: 59

## 2012-04-11 ENCOUNTER — Ambulatory Visit (HOSPITAL_COMMUNITY)
Admission: RE | Admit: 2012-04-11 | Discharge: 2012-04-11 | Disposition: A | Discharge status: Home / Self Care | Payer: 59 | Source: Ambulatory Visit | Attending: Nurse Practitioner | Admitting: Nurse Practitioner

## 2012-04-11 DIAGNOSIS — C189 Malignant neoplasm of colon, unspecified: Secondary | ICD-10-CM

## 2012-04-11 DIAGNOSIS — Z9221 Personal history of antineoplastic chemotherapy: Secondary | ICD-10-CM | POA: Insufficient documentation

## 2012-04-11 DIAGNOSIS — C180 Malignant neoplasm of cecum: Secondary | ICD-10-CM

## 2012-04-11 DIAGNOSIS — Z9049 Acquired absence of other specified parts of digestive tract: Secondary | ICD-10-CM | POA: Insufficient documentation

## 2012-04-11 LAB — BASIC METABOLIC PANEL (CC13)
BUN: 18 mg/dL (ref 7.0–26.0)
Glucose: 89 mg/dl (ref 70–99)
Potassium: 4.3 mEq/L (ref 3.5–5.1)

## 2012-04-11 MED ORDER — IOHEXOL 300 MG/ML  SOLN
100.0000 mL | Freq: Once | INTRAMUSCULAR | Status: AC | PRN
  Administered 2012-04-11: 100 mL via INTRAVENOUS

## 2012-04-15 ENCOUNTER — Telehealth: Payer: Self-pay | Admitting: *Deleted

## 2012-04-15 ENCOUNTER — Ambulatory Visit (HOSPITAL_BASED_OUTPATIENT_CLINIC_OR_DEPARTMENT_OTHER): Payer: 59 | Admitting: Oncology

## 2012-04-15 ENCOUNTER — Telehealth: Payer: Self-pay | Admitting: Oncology

## 2012-04-15 ENCOUNTER — Other Ambulatory Visit: Payer: Self-pay | Admitting: Oncology

## 2012-04-15 VITALS — BP 98/63 | HR 71 | Temp 98.5°F | Resp 20 | Ht 71.0 in | Wt 238.4 lb

## 2012-04-15 DIAGNOSIS — K7689 Other specified diseases of liver: Secondary | ICD-10-CM

## 2012-04-15 DIAGNOSIS — C189 Malignant neoplasm of colon, unspecified: Secondary | ICD-10-CM

## 2012-04-15 DIAGNOSIS — C182 Malignant neoplasm of ascending colon: Secondary | ICD-10-CM

## 2012-04-15 NOTE — Telephone Encounter (Signed)
gv and printed appt schedule for pt for May2014 °

## 2012-04-15 NOTE — Telephone Encounter (Signed)
Dr. Truett Perna reviewed CT film with radiologist and lesion on liver was present 2 years ago, but is slightly larger. Likely a cyst, but suggest doing MRI to determine for sure and put this to rest. Dr. Truett Perna has ordered MRI. Wife notified and agrees (patient was not at home).

## 2012-04-15 NOTE — Progress Notes (Signed)
   [facility]    OFFICE PROGRESS NOTE   INTERVAL HISTORY:   He returns as scheduled. He currently has a "sinus "infection with a sore throat and drainage. Good appetite. He underwent a colonoscopy by Dr. Ezzard Standing on 03/18/2012. No evidence of malignancy. The neuropathy symptoms have improved. Minimal "tingling" in the fingers.  Objective:  Vital signs in last 24 hours:  Blood pressure 98/63, pulse 71, temperature 98.5 F (36.9 C), temperature source Oral, resp. rate 20, height 5\' 11"  (1.803 m), weight 238 lb 6.4 oz (108.138 kg).    HEENT: Pharynx without exudate, neck without mass Lymphatics: No cervical, supraclavicular, axillary, or inguinal nodes Resp: End inspiratory bronchial sounds at the upper posterior chest bilaterally, no respiratory distress Cardio: Regular rate and rhythm GI: No hepatomegaly, no mass Vascular: No leg edema   Lab Results:  Lab Results  Component Value Date   WBC 5.9 03/11/2012   HGB 15.5 03/11/2012   HCT 46.6 03/11/2012   MCV 97.7 03/11/2012   PLT 253.0 03/11/2012   ANC 2.5 CEA on 04/11/2012-less than 0.5  X-rays: CT of the chest on 04/11/2012-negative, no nodule or adenopathy. CT abdomen pelvis-a 17 mm low attenuation lesion in the caudate lobe of the liver is more conspicuous than on prior studies. No new liver lesions. No pelvic, retroperitoneal, or mesenteric adenopathy.    Medications: I have reviewed the patient's current medications.  Assessment/Plan: 1. 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 He began treatment with FOLFOX in December 2011. He completed cycle #12 on 10/27/2010. Oxaliplatin was held with cycles number 7 through 10 and resumed with cycle #11. 2. History of neutropenia secondary to chemotherapy. 3. History of thrombocytopenia secondary chemotherapy. 4. He did not undergo a preoperative colonoscopy.  He underwent a colonoscopy on 12/19/2010 with findings of a 1 cm polyp at 90 cm from the anal verge, minimal polyp at 30 cm from the anal verge and minimal diverticulosis. Colonoscopy 03/18/2012-negative 5. 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 He began study drug on 05/13/2010. 6. Status post Port-A-Cath placement. The Port-A-Cath has been removed. 7. 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 Low attenuation lesion in the caudate lobe of the liver on the CT 04/11/2012-? Cyst. I will review the CTs with a radiologist and decide on the need for an MRI.   Disposition:  He remains in clinical remission from colon cancer. I will review the 04/11/2012 CT and decide on the indication for additional imaging. Mr. Peral will return for an office visit and CEA in 6 months.   Thornton Papas, MD  04/15/2012  10:46 AM

## 2012-04-19 ENCOUNTER — Ambulatory Visit (HOSPITAL_COMMUNITY)
Admission: RE | Admit: 2012-04-19 | Discharge: 2012-04-19 | Disposition: A | Discharge status: Home / Self Care | Payer: 59 | Source: Ambulatory Visit | Attending: Oncology | Admitting: Oncology

## 2012-04-19 ENCOUNTER — Other Ambulatory Visit: Payer: Self-pay | Admitting: Oncology

## 2012-04-19 DIAGNOSIS — C189 Malignant neoplasm of colon, unspecified: Secondary | ICD-10-CM

## 2012-04-19 DIAGNOSIS — Z85038 Personal history of other malignant neoplasm of large intestine: Secondary | ICD-10-CM | POA: Insufficient documentation

## 2012-04-19 DIAGNOSIS — K7689 Other specified diseases of liver: Secondary | ICD-10-CM | POA: Insufficient documentation

## 2012-04-19 MED ORDER — GADOBENATE DIMEGLUMINE 529 MG/ML IV SOLN
20.0000 mL | Freq: Once | INTRAVENOUS | Status: AC | PRN
  Administered 2012-04-19: 20 mL via INTRAVENOUS

## 2012-04-21 ENCOUNTER — Telehealth: Payer: Self-pay | Admitting: *Deleted

## 2012-04-21 NOTE — Telephone Encounter (Signed)
Informed wife that per MRI and Dr. Truett Perna, the liver lesion appears to be a cyst and not cancer. Follow up as scheduled. Wife requests to change his May appointment to 9th. Instructed her to call scheduling to do this.

## 2012-04-21 NOTE — Telephone Encounter (Signed)
Message copied by Wandalee Ferdinand on Thu Apr 21, 2012 10:28 AM ------      Message from: Thornton Papas B      Created: Wed Apr 20, 2012  9:09 PM       Please call patient, liver lesion appears to be a cyst, f/u as scheduled

## 2012-05-03 ENCOUNTER — Telehealth: Payer: Self-pay | Admitting: Oncology

## 2012-05-03 NOTE — Telephone Encounter (Signed)
called and r/s to 5/8 appt to 5/9    anne

## 2012-10-13 ENCOUNTER — Other Ambulatory Visit: Payer: 59 | Admitting: Lab

## 2012-10-13 ENCOUNTER — Ambulatory Visit: Payer: 59 | Admitting: Nurse Practitioner

## 2012-10-14 ENCOUNTER — Telehealth: Payer: Self-pay | Admitting: Oncology

## 2012-10-14 ENCOUNTER — Other Ambulatory Visit: Payer: 59 | Admitting: Lab

## 2012-10-14 ENCOUNTER — Ambulatory Visit (HOSPITAL_BASED_OUTPATIENT_CLINIC_OR_DEPARTMENT_OTHER): Payer: 59 | Admitting: Nurse Practitioner

## 2012-10-14 VITALS — BP 129/78 | HR 53 | Temp 97.3°F | Resp 18 | Ht 71.0 in | Wt 241.1 lb

## 2012-10-14 DIAGNOSIS — C189 Malignant neoplasm of colon, unspecified: Secondary | ICD-10-CM

## 2012-10-14 DIAGNOSIS — R6889 Other general symptoms and signs: Secondary | ICD-10-CM

## 2012-10-14 DIAGNOSIS — K7689 Other specified diseases of liver: Secondary | ICD-10-CM

## 2012-10-14 LAB — CEA: CEA: <0.5 ng/mL (ref 0.0–5.0)

## 2012-10-14 NOTE — Telephone Encounter (Signed)
gv and printed appt sched and avs for pt for Nov...gv pt barium

## 2012-10-14 NOTE — Progress Notes (Signed)
OFFICE PROGRESS NOTE  Interval history:  Bryan Fields returns as scheduled. He feels well. He has a good appetite. Overall good energy level. Bowels moving regularly. No change in bowel habits. No bloody or black stools. No nausea or vomiting. He has occasional pain at the left abdomen which he attributes to "gas". Neuropathy symptoms continue to be improved.  For the past month he has had the sensation that "something" is stuck in his throat. He had a similar occurrence 8-10 years ago which resolved following a course of Prilosec. He has been on Prilosec 2 weeks and has noted no change.   Objective: Blood pressure 129/78, pulse 53, temperature 97.3 F (36.3 C), temperature source Oral, resp. rate 18, height 5\' 11"  (1.803 m), weight 241 lb 1.6 oz (109.362 kg).  No thrush or ulcerations. No palpable cervical, supraclavicular, axillary or inguinal lymph nodes. Lungs are clear. Regular cardiac rhythm. Abdomen is soft and nontender. No organomegaly. No mass. Extremities are without edema.  Lab Results: Lab Results  Component Value Date   WBC 5.9 03/11/2012   HGB 15.5 03/11/2012   HCT 46.6 03/11/2012   MCV 97.7 03/11/2012   PLT 253.0 03/11/2012    Chemistry:    Chemistry      Component Value Date/Time   NA 137 04/11/2012 1257   NA 138 03/11/2012 0912   NA 147* 04/20/2011 1504   K 4.3 04/11/2012 1257   K 4.5 03/11/2012 0912   K 4.2 04/20/2011 1504   CL 103 04/11/2012 1257   CL 105 03/11/2012 0912   CL 104 04/20/2011 1504   CO2 26 04/11/2012 1257   CO2 29 03/11/2012 0912   CO2 29 04/20/2011 1504   BUN 18.0 04/11/2012 1257   BUN 18 03/11/2012 0912   BUN 23* 04/20/2011 1504   CREATININE 1.3 04/11/2012 1257   CREATININE 1.2 03/11/2012 0912   CREATININE 1.1 04/20/2011 1504      Component Value Date/Time   CALCIUM 9.8 04/11/2012 1257   CALCIUM 9.1 03/11/2012 0912   CALCIUM 9.2 04/20/2011 1504   ALKPHOS 48 03/11/2012 0912   AST 27 03/11/2012 0912   ALT 27 03/11/2012 0912   BILITOT 0.8 03/11/2012  0912       Studies/Results: No results found.  Medications: I have reviewed the patient's current medications.  Assessment/Plan:  1. 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 He began treatment with FOLFOX in December 2011. He completed cycle #12 on 10/27/2010. Oxaliplatin was held with cycles number 7 through 10 and resumed with cycle #11. 2. History of neutropenia secondary to chemotherapy. 3. History of thrombocytopenia secondary chemotherapy. 4. He did not undergo a preoperative colonoscopy. He underwent a colonoscopy on 12/19/2010 with findings of a 1 cm polyp at 90 cm from the anal verge, minimal polyp at 30 cm from the anal verge and minimal diverticulosis. Colonoscopy 03/18/2012-negative. 5. 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 He began study drug on 05/13/2010. 6. Status post Port-A-Cath placement. The Port-A-Cath has been removed. 7. 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 9. Low attenuation lesion in the caudate lobe of the liver on the CT 04/11/2012-? Cyst. MRI liver on 04/20/2012 favored the caudate lobe liver lesion to represent a cyst or complex cyst. 10. Iron deposition within the liver noted on  MRI 04/20/2012. We are obtaining a ferritin level today. 11. Sensation of "something stuck" in the throat. He will contact his primary care provider if this persists.  Disposition-Mr. Piscitello appears stable. He remains in clinical remission from the colon cancer. We will followup on the CEA from today. He will return for a followup visit in 6 months with a CEA and CT scans 1 week prior. He will contact the office in the interim with any problems.  Plan reviewed with Dr. Truett Perna.  Lonna Cobb  ANP/GNP-BC

## 2012-10-15 ENCOUNTER — Other Ambulatory Visit: Payer: Self-pay | Admitting: Oncology

## 2012-10-15 DIAGNOSIS — C189 Malignant neoplasm of colon, unspecified: Secondary | ICD-10-CM

## 2012-10-18 ENCOUNTER — Telehealth: Payer: Self-pay | Admitting: *Deleted

## 2012-10-18 NOTE — Telephone Encounter (Signed)
Message copied by Wandalee Ferdinand on Tue Oct 18, 2012  5:56 PM ------      Message from: Ladene Artist      Created: Sat Oct 15, 2012  9:04 AM       Please call patient, cea is normal ------

## 2012-10-18 NOTE — Telephone Encounter (Signed)
Notified of normal CEA. 

## 2013-03-08 ENCOUNTER — Other Ambulatory Visit: Payer: Self-pay | Admitting: Family Medicine

## 2013-03-08 DIAGNOSIS — I10 Essential (primary) hypertension: Secondary | ICD-10-CM

## 2013-03-08 DIAGNOSIS — E7800 Pure hypercholesterolemia, unspecified: Secondary | ICD-10-CM

## 2013-03-08 DIAGNOSIS — D7589 Other specified diseases of blood and blood-forming organs: Secondary | ICD-10-CM

## 2013-03-08 DIAGNOSIS — R7309 Other abnormal glucose: Secondary | ICD-10-CM

## 2013-03-08 DIAGNOSIS — Z125 Encounter for screening for malignant neoplasm of prostate: Secondary | ICD-10-CM

## 2013-03-16 ENCOUNTER — Other Ambulatory Visit (INDEPENDENT_AMBULATORY_CARE_PROVIDER_SITE_OTHER): Payer: 59

## 2013-03-16 DIAGNOSIS — E7800 Pure hypercholesterolemia, unspecified: Secondary | ICD-10-CM

## 2013-03-16 DIAGNOSIS — R7309 Other abnormal glucose: Secondary | ICD-10-CM

## 2013-03-16 DIAGNOSIS — Z0000 Encounter for general adult medical examination without abnormal findings: Secondary | ICD-10-CM

## 2013-03-16 DIAGNOSIS — I10 Essential (primary) hypertension: Secondary | ICD-10-CM

## 2013-03-16 DIAGNOSIS — D7589 Other specified diseases of blood and blood-forming organs: Secondary | ICD-10-CM

## 2013-03-16 LAB — LIPID PANEL
HDL: 37 mg/dL — ABNORMAL LOW (ref >39.00)
LDL Cholesterol: 65 mg/dL (ref 0–99)
Total CHOL/HDL Ratio: 3

## 2013-03-16 LAB — CBC WITH DIFFERENTIAL/PLATELET
Basophils Relative: 0.4 % (ref 0.0–3.0)
Eosinophils Absolute: 0.1 10*3/uL (ref 0.0–0.7)
Eosinophils Relative: 1.6 % (ref 0.0–5.0)
HCT: 45.9 % (ref 39.0–52.0)
Lymphocytes Relative: 38.3 % (ref 12.0–46.0)
MCHC: 34.5 g/dL (ref 30.0–36.0)
Monocytes Absolute: 0.6 10*3/uL (ref 0.1–1.0)
Monocytes Relative: 8.4 % (ref 3.0–12.0)
Neutrophils Relative %: 51.3 % (ref 43.0–77.0)
RBC: 4.79 Mil/uL (ref 4.22–5.81)
WBC: 6.7 10*3/uL (ref 4.5–10.5)

## 2013-03-16 LAB — BASIC METABOLIC PANEL
BUN: 27 mg/dL — ABNORMAL HIGH (ref 6–23)
CO2: 25 mEq/L (ref 19–32)
Chloride: 105 mEq/L (ref 96–112)
Creatinine, Ser: 1.3 mg/dL (ref 0.4–1.5)
Glucose, Bld: 106 mg/dL — ABNORMAL HIGH (ref 70–99)

## 2013-03-16 LAB — PSA: PSA: 0.34 ng/mL (ref 0.10–4.00)

## 2013-03-16 LAB — VITAMIN B12: Vitamin B-12: 1499 pg/mL — ABNORMAL HIGH (ref 211–911)

## 2013-03-17 ENCOUNTER — Other Ambulatory Visit: Payer: 59

## 2013-03-24 ENCOUNTER — Encounter: Payer: Self-pay | Admitting: Family Medicine

## 2013-03-24 ENCOUNTER — Ambulatory Visit (INDEPENDENT_AMBULATORY_CARE_PROVIDER_SITE_OTHER): Payer: 59 | Admitting: Family Medicine

## 2013-03-24 VITALS — BP 130/88 | HR 68 | Temp 98.4°F | Ht 71.0 in | Wt 248.0 lb

## 2013-03-24 DIAGNOSIS — R7309 Other abnormal glucose: Secondary | ICD-10-CM

## 2013-03-24 DIAGNOSIS — K6289 Other specified diseases of anus and rectum: Secondary | ICD-10-CM

## 2013-03-24 DIAGNOSIS — E7800 Pure hypercholesterolemia, unspecified: Secondary | ICD-10-CM

## 2013-03-24 DIAGNOSIS — C189 Malignant neoplasm of colon, unspecified: Secondary | ICD-10-CM

## 2013-03-24 DIAGNOSIS — E669 Obesity, unspecified: Secondary | ICD-10-CM

## 2013-03-24 DIAGNOSIS — I10 Essential (primary) hypertension: Secondary | ICD-10-CM

## 2013-03-24 DIAGNOSIS — Z0000 Encounter for general adult medical examination without abnormal findings: Secondary | ICD-10-CM

## 2013-03-24 MED ORDER — SIMVASTATIN 40 MG PO TABS: ORAL_TABLET | ORAL | Status: DC

## 2013-03-24 MED ORDER — SILDENAFIL CITRATE 100 MG PO TABS: 100.0000 mg | ORAL_TABLET | Freq: Every day | ORAL | Status: DC | PRN

## 2013-03-24 MED ORDER — ATENOLOL 50 MG PO TABS: ORAL_TABLET | ORAL | Status: DC
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
## 2013-03-24 NOTE — Assessment & Plan Note (Signed)
Chronic, stable. Continue simvastatin.  

## 2013-03-24 NOTE — Assessment & Plan Note (Signed)
Preventative protocols reviewed and updated unless pt declined. Discussed healthy diet and lifestyle. Flu shot at work. 

## 2013-03-24 NOTE — Assessment & Plan Note (Signed)
reviewed #s.  Encouraged decreased sugar intake.

## 2013-03-24 NOTE — Assessment & Plan Note (Signed)
Stable. Continue to f/u with onc.  rec schedule appt with Dr. Ezzard Standing for f/u as due.

## 2013-03-24 NOTE — Assessment & Plan Note (Signed)
Exam normal today - no evidence of int or ext hemorrhoids.  rec check with Dr. Ezzard Standing.

## 2013-03-24 NOTE — Assessment & Plan Note (Signed)
Chronic, stable. Continue meds. 

## 2013-03-24 NOTE — Assessment & Plan Note (Signed)
Discussed activity approaches needed to lose weight.  Encouraged regular exercise in form of brisk walking several days a week.

## 2013-03-24 NOTE — Patient Instructions (Signed)
Let's start regular exercise - ie brisk walking Let's keep an eye on weight - sugar was a bit elevated again today. Schedule appointment with Dr. Ezzard Standing for follow up. Good to see you today, call us with questions.

## 2013-04-17 ENCOUNTER — Ambulatory Visit (HOSPITAL_COMMUNITY)
Admission: RE | Admit: 2013-04-17 | Discharge: 2013-04-17 | Disposition: A | Discharge status: Home / Self Care | Payer: 59 | Source: Ambulatory Visit | Attending: Nurse Practitioner | Admitting: Nurse Practitioner

## 2013-04-17 ENCOUNTER — Other Ambulatory Visit (HOSPITAL_BASED_OUTPATIENT_CLINIC_OR_DEPARTMENT_OTHER): Payer: 59 | Admitting: Lab

## 2013-04-17 ENCOUNTER — Encounter (HOSPITAL_COMMUNITY): Payer: Self-pay

## 2013-04-17 DIAGNOSIS — C189 Malignant neoplasm of colon, unspecified: Secondary | ICD-10-CM

## 2013-04-17 DIAGNOSIS — K7689 Other specified diseases of liver: Secondary | ICD-10-CM | POA: Diagnosis not present

## 2013-04-17 DIAGNOSIS — R918 Other nonspecific abnormal finding of lung field: Secondary | ICD-10-CM | POA: Insufficient documentation

## 2013-04-17 DIAGNOSIS — C182 Malignant neoplasm of ascending colon: Secondary | ICD-10-CM

## 2013-04-17 LAB — COMPREHENSIVE METABOLIC PANEL (CC13)
ALT: 27 U/L (ref 0–55)
AST: 26 U/L (ref 5–34)
Alkaline Phosphatase: 53 U/L (ref 40–150)
Anion Gap: 10 mEq/L (ref 3–11)
CO2: 22 mEq/L (ref 22–29)
Creatinine: 1.2 mg/dL (ref 0.7–1.3)
Glucose: 85 mg/dl (ref 70–140)
Potassium: 4.3 mEq/L (ref 3.5–5.1)
Sodium: 137 mEq/L (ref 136–145)
Total Bilirubin: 0.6 mg/dL (ref 0.20–1.20)
Total Protein: 7.7 g/dL (ref 6.4–8.3)

## 2013-04-17 MED ORDER — IOHEXOL 300 MG/ML  SOLN
100.0000 mL | Freq: Once | INTRAMUSCULAR | Status: AC | PRN
  Administered 2013-04-17: 100 mL via INTRAVENOUS

## 2013-04-18 LAB — CEA: CEA: <0.5 ng/mL (ref 0.0–5.0)

## 2013-04-24 ENCOUNTER — Encounter (INDEPENDENT_AMBULATORY_CARE_PROVIDER_SITE_OTHER): Payer: Self-pay

## 2013-04-24 ENCOUNTER — Telehealth: Payer: Self-pay | Admitting: Oncology

## 2013-04-24 ENCOUNTER — Ambulatory Visit (HOSPITAL_BASED_OUTPATIENT_CLINIC_OR_DEPARTMENT_OTHER): Payer: 59 | Admitting: Oncology

## 2013-04-24 VITALS — BP 122/64 | HR 63 | Temp 98.3°F | Resp 18 | Ht 71.0 in | Wt 247.4 lb

## 2013-04-24 DIAGNOSIS — C189 Malignant neoplasm of colon, unspecified: Secondary | ICD-10-CM

## 2013-04-24 DIAGNOSIS — K6289 Other specified diseases of anus and rectum: Secondary | ICD-10-CM

## 2013-04-24 DIAGNOSIS — C182 Malignant neoplasm of ascending colon: Secondary | ICD-10-CM

## 2013-04-24 DIAGNOSIS — G620 Drug-induced polyneuropathy: Secondary | ICD-10-CM

## 2013-04-24 NOTE — Progress Notes (Signed)
   [facility]    OFFICE PROGRESS NOTE   INTERVAL HISTORY:   Bryan Fields returns for scheduled followup of colon cancer. He feels well. Good appetite. He reports mild exertional dyspnea. His chief complaint is pain at the anus. This has been present chronically. He notes blood on the toilet paper when the area is "inflamed ". The neuropathy symptoms at the hands and feet have almost completely resolved.  He complains of feeling like the lips are "tightening "over his teeth for the past 6 months.  Objective:  Vital signs in last 24 hours:  Blood pressure 122/64, pulse 63, temperature 98.3 F (36.8 C), temperature source Oral, resp. rate 18, height 5\' 11"  (1.803 m), weight 247 lb 6.4 oz (112.22 kg), SpO2 100.00%.    HEENT: Soft prominence at the inferior tip of the right parotid, neck without mass Lymphatics: No cervical, supraclavicular, axillary, or inguinal nodes Resp: Lungs clear bilaterally Cardio: Regular rate and rhythm GI: No hepatomegaly, nontender, no mass Vascular: No leg edema Rectal: Small hemorrhoid/skin tag at the inferior anal verge with irregularity of the mucosa at the inferior anal canal just proximal to the anal verge. No mass.      Lab Results:  CEA on 04/17/2013-less than 0.5 Ferritin 04/17/2013-279  X-rays: CT the chest, abdomen, and pelvis on 04/17/2013, compared to 04/11/2012: Subcentimeter bilateral pulmonary nodules are stable. No new or enlarging nodules. Enlargement of an ill-defined lesion in the caudate lobe of the liver measuring 1.4 x 3.1 cm. No new liver lesions.   Medications: I have reviewed the patient's current medications.  Assessment/Plan: 1. 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 He began treatment with FOLFOX in December 2011. He completed cycle #12 on 10/27/2010. Oxaliplatin was held with cycles number 7  through 10 and resumed with cycle #11. 2. History of neutropenia secondary to chemotherapy. 3. History of thrombocytopenia secondary chemotherapy. 4. He did not undergo a preoperative colonoscopy. He underwent a colonoscopy on 12/19/2010 with findings of a 1 cm polyp at 90 cm from the anal verge, minimal polyp at 30 cm from the anal verge and minimal diverticulosis. Colonoscopy 03/18/2012-negative. 5. 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 He began study drug on 05/13/2010. 6. Status post Port-A-Cath placement. The Port-A-Cath has been removed. 7. 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 9. Low attenuation lesion in the caudate lobe of the liver on the CT 04/11/2012-? Cyst. MRI liver on 04/20/2012 favored the caudate lobe liver lesion to represent a cyst or complex cyst. 10. Iron deposition within the liver noted on MRI 04/20/2012. The ferritin level was normal on 04/17/2013 11. Pain/bleeding at the anus-? Fisher,? Hemorrhoid-I referred him to Dr. Ezzard Standing   Disposition:  Bryan Fields remains in clinical remission from colon cancer. He will return for an office visit and CEA in 6 months.  We will refer him to Dr. Ezzard Standing to evaluate the anal symptoms.  I cannot relate the lips/tooth symptoms to oxaliplatin neuropathy. There is slight fullness at the inferior aspect of the right parotid. He will ask Dr. Ezzard Standing to evaluate this area.   Thornton Papas, MD  04/24/2013  8:50 AM

## 2013-04-24 NOTE — Telephone Encounter (Signed)
gv pt appt schedule for May 2015. S/wp Bridgette @ [facility] re appt w/Dr. Ezzard Standing. Per Bridgette 1st avail in January. Bridgette sent message to Dr. Allene Pyo nurse for sooner appt and [facility] will contact pt w/appt. Pt aware.

## 2013-05-10 ENCOUNTER — Telehealth: Payer: Self-pay | Admitting: *Deleted

## 2013-05-10 NOTE — Telephone Encounter (Signed)
Left message on voicemail for pt to call office. Per Dr. Truett Perna: Case was reviewed in conference. Liver lesion is slightly larger-- likely benign. Radiologist and surgeons recommend PET to be sure lesion is benign.

## 2013-05-12 ENCOUNTER — Ambulatory Visit (INDEPENDENT_AMBULATORY_CARE_PROVIDER_SITE_OTHER): Payer: 59 | Admitting: Surgery

## 2013-05-12 ENCOUNTER — Encounter (INDEPENDENT_AMBULATORY_CARE_PROVIDER_SITE_OTHER): Payer: Self-pay | Admitting: Surgery

## 2013-05-12 VITALS — BP 124/86 | HR 64 | Temp 97.1°F | Resp 14 | Ht 71.0 in | Wt 247.2 lb

## 2013-05-12 DIAGNOSIS — C189 Malignant neoplasm of colon, unspecified: Secondary | ICD-10-CM

## 2013-05-12 NOTE — Progress Notes (Addendum)
[facility]  Ovidio Kin, MD,  FACS [address] [place]  [address] [place], [place]    [address] Phone:  [phone] FAX:  [phone]   Re:   Bryan Fields DOB:   1960/09/26 MRN:   [id number]  ASSESSMENT AND PLAN: 1. 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  T3, N1 (2/15 nodes)  Right hemicolectomy - 04/03/2010.  His cancer was positive for KRAS. He received FOLFOX and oxaliplatin.  CEA - <0.5 - 04/17/2013  Sees Dr. Leonard Schwartz. Sherrill.  He is disease free and will see me back in 6 months.  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  DN 11/11/2013]  2. Paresthesia secondary to chemotherapy.   These seem to have essentially resolved. 3. Hypertension.  4. Last colonoscopy. - 03/18/2012.  Plan next colonoscopy 2016. 5.  Posterior anal fissure  Gave literature on fissures.  To do sitz baths BID and diltiazam 2% to rectum QID.  I briefly discussed surgical options, but these usually heal with medical treatment 6.  Caudate lobe abnormality on CT scan.  I spoke to [place] at Dr. Kalman Drape office and he plans a PET scan, which seems appropriate. 7.  Possible right parotid mass  (I think)  Will see if this appears on the PET.  He will consider ENT consult. 8.  Some lip numbness.  His upper lip sticks to his teeth.  HISTORY OF PRESENT ILLNESS: Chief Complaint  Patient presents with  . Rectal Pain    new pt- eval fissure   Bryan Fields is a 52 y.o. (DOB: 1961/03/11)  white male who is a patient of Eustaquio Boyden, MD and comes to me today for follow up of a right colon cancer.  He comes today for 2 things.  He has rectal pain with bowel movements. And he occasionally notices some blood in his stool.  He had a negative colonoscopy last year.  He has tired preparation H with no help.  He has tried some rectal pads which help a little. And he has a small mass that he thinks has been there 10 years off the edge of his left mandible.  He has never  mentioned this to me before.  The mass does not hurt, he can not tell that it has changed in size, and is not associated with anything that he does.  Social History: Married. Works at [employer].  PHYSICAL EXAM: BP 124/86  Pulse 64  Temp(Src) 97.1 F (36.2 C) (Temporal)  Resp 14  Ht 5\' 11"  (1.803 m)  Wt 247 lb 3.2 oz (112.129 kg)  BMI 34.49 kg/m2  General: WN mildly overweight WM who is alert and generally healthy appearing.  HEENT: Normal. Pupils equal. Good dentition.  2 cm mass at posterior of right parotid.  Could also be lymph node.  I see nothing inside of his mouth. Neck: Supple. No mass.  No thyroid mass.  Lymph Nodes:  No supraclavicular or cervical nodes. Lungs: Clear to auscultation and symmetric breath sounds. Heart:  RRR. No murmur or rub.  Abdomen: Soft. No mass. No tenderness. Normal bowel sounds.  His incision looks good, no hernia. Rectal: Posterior anal fissure visible.  Pain with posterior rectal pressure.  I feel no rectal mass.  DATA REVIEWED: CEA - <0.5 - 04/17/2013 CT scan from 04/17/2013 suggest changes in caudate lobe mass.  I spoke with Kenney Houseman and there are plans for a MRI.  I gave the patient copies of his report.  Ovidio Kin, MD, FACS Office:  [phone]

## 2013-08-15 ENCOUNTER — Telehealth: Payer: Self-pay | Admitting: *Deleted

## 2013-08-15 DIAGNOSIS — C189 Malignant neoplasm of colon, unspecified: Secondary | ICD-10-CM

## 2013-08-15 NOTE — Telephone Encounter (Signed)
Message copied by Brien Few on Tue Aug 15, 2013  5:14 PM ------      Message from: Betsy Coder B      Created: Mon Aug 14, 2013 10:28 PM      Regarding: RE: PET scan       Yes to evaluate liver lesion, try to schedule prior to next office      ----- Message -----         From: Tania Ade, RN         Sent: 08/14/2013   3:26 PM           To: Ladell Pier, MD      Subject: PET scan                                                 Wife asking if he still needs PET scan?      Next OV 3/18       ------

## 2013-08-16 ENCOUNTER — Telehealth: Payer: Self-pay | Admitting: *Deleted

## 2013-08-16 NOTE — Telephone Encounter (Signed)
Notified that Dr. Benay Spice still wants him to have the PET scan to follow up on liver lesion. She was not aware it had been scheduled for 5/11. Will mail appointment calendar to their home and write radiology phone # on bottom to call with questions if scheduling does not call them soon.

## 2013-10-13 ENCOUNTER — Other Ambulatory Visit: Payer: 59 | Admitting: Lab

## 2013-10-13 ENCOUNTER — Ambulatory Visit: Payer: 59 | Admitting: Nurse Practitioner

## 2013-10-16 ENCOUNTER — Ambulatory Visit (HOSPITAL_COMMUNITY): Payer: 59

## 2013-10-23 ENCOUNTER — Ambulatory Visit (HOSPITAL_BASED_OUTPATIENT_CLINIC_OR_DEPARTMENT_OTHER): Payer: 59 | Admitting: Nurse Practitioner

## 2013-10-23 ENCOUNTER — Other Ambulatory Visit (HOSPITAL_BASED_OUTPATIENT_CLINIC_OR_DEPARTMENT_OTHER): Payer: 59

## 2013-10-23 ENCOUNTER — Telehealth: Payer: Self-pay | Admitting: Oncology

## 2013-10-23 VITALS — BP 125/63 | HR 55 | Temp 97.0°F | Resp 19 | Ht 71.0 in | Wt 254.2 lb

## 2013-10-23 DIAGNOSIS — C189 Malignant neoplasm of colon, unspecified: Secondary | ICD-10-CM

## 2013-10-23 DIAGNOSIS — C182 Malignant neoplasm of ascending colon: Secondary | ICD-10-CM

## 2013-10-23 DIAGNOSIS — K769 Liver disease, unspecified: Secondary | ICD-10-CM

## 2013-10-23 NOTE — Progress Notes (Addendum)
Bryan Fields OFFICE PROGRESS NOTE   Diagnosis: Colon cancer.   INTERVAL HISTORY:   Bryan Fields returns as scheduled. He feels well. He has a good appetite. He is gaining weight. Bowels moving regularly. He occasionally notes a small amount of blood on the toilet tissue if he has a hard bowel movement. He denies pain except related to a recent tooth extraction. No nausea or vomiting. No shortness of breath, chest pain. He denies fever. No hematuria or dysuria. Neuropathy symptoms have almost entirely resolved.   Objective:  Vital signs in last 24 hours:  Blood pressure 125/63, pulse 55, temperature 97 F (36.1 C), temperature source Oral, resp. rate 19, height _0  (1.803 m), weight 254 lb 3.2 oz (115.304 kg).    HEENT: No thrush or ulcerations. Lymphatics: No palpable cervical, supraclavicular, axillary or inguinal lymph nodes. Resp: Lungs clear. Cardio: Regular cardiac rhythm. GI: Soft and nontender. No organomegaly. No mass. Vascular: No leg edema. Calves nontender. Skin: No rash.    Lab Results:  Lab Results  Component Value Date   WBC 6.7 03/16/2013   HGB 15.9 03/16/2013   HCT 45.9 03/16/2013   MCV 95.9 03/16/2013   PLT 282.0 03/16/2013   NEUTROABS 3.4 03/16/2013    Imaging:  No results found.  Medications: I have reviewed the patient's current medications.  Assessment/Plan: 1. 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 He began treatment with FOLFOX in December 2011. He completed cycle #12 on 10/27/2010. Oxaliplatin was held with cycles number 7 through 10 and resumed with cycle #11. 2. History of neutropenia secondary to chemotherapy. 3. History of thrombocytopenia secondary chemotherapy. 4. He did not undergo a preoperative colonoscopy. He underwent a colonoscopy on 12/19/2010 with findings of a 1 cm polyp at 90 cm from the anal verge,  minimal polyp at 30 cm from the anal verge and minimal diverticulosis. Colonoscopy 03/18/2012-negative. 5. 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 He began study drug on 05/13/2010. 6. Status post Port-A-Cath placement. The Port-A-Cath has been removed. 7. 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 9. Low attenuation lesion in the caudate lobe of the liver on the CT 04/11/2012-? Cyst. MRI liver on 04/20/2012 favored the caudate lobe liver lesion to represent a cyst or complex cyst. CT abdomen/pelvis 04/17/2013 with continued enlargement of hypovascular lesion in the caudate lobe of the liver. 10. Iron deposition within the liver noted on MRI 04/20/2012. The ferritin level was normal on 04/17/2013 11. Pain/bleeding at the anus reported when here 04/24/2013. Status post evaluation by Dr. [name] with findings of an anal fissure.   Disposition: He appears stable. He remains in clinical remission from colon cancer. We will followup on the CEA from today.   He is scheduled for a PET scan later this week to evaluate the liver lesion. We will contact him once the result is available.  He will return for a followup visit and CEA in 6 months. He will contact the office in the interim with any problems.  Plan reviewed with Dr. Benay Spice.    Owens Shark ANP/GNP-BC   10/23/2013  2:28 PM  Dr. Benay Spice and I have reviewed the most recent CT as well as previous CTs and MRI with a [facility] radiologist. The liver lesion has been present  dating to 2011. There has been minimal change in the size of the lesion. There are no new lesions. The lesion does not have MRI imaging features suggestive of a metastasis.  We will contact Bryan Fields with this information. Options at this time include no further imaging, proceed with PET scan as  scheduled, repeat a CT scan.

## 2013-10-23 NOTE — Telephone Encounter (Signed)
Gave pt appt for lab and MD for November 2015 °

## 2013-10-24 LAB — CEA

## 2013-10-25 ENCOUNTER — Telehealth: Payer: Self-pay

## 2013-10-25 NOTE — Telephone Encounter (Signed)
Message copied by Bevelyn Ngo on Wed Oct 25, 2013 10:45 AM ------      Message from: Ladell Pier      Created: Tue Oct 24, 2013 10:26 PM       Please call patient, cea is nromal ------

## 2013-10-25 NOTE — Telephone Encounter (Signed)
Informed Wife of lab results per Dr.Sherrill. Mrs. Colegrove denies any questions or concerns at this time. Informed her if she or her husband had any questions or concerns to call.

## 2013-10-26 ENCOUNTER — Encounter (HOSPITAL_COMMUNITY)
Admission: RE | Admit: 2013-10-26 | Discharge: 2013-10-26 | Disposition: A | Discharge status: Home / Self Care | Payer: 59 | Source: Ambulatory Visit | Attending: Oncology | Admitting: Oncology

## 2013-10-26 ENCOUNTER — Encounter (HOSPITAL_COMMUNITY): Payer: Self-pay

## 2013-10-26 DIAGNOSIS — R918 Other nonspecific abnormal finding of lung field: Secondary | ICD-10-CM | POA: Insufficient documentation

## 2013-10-26 DIAGNOSIS — C189 Malignant neoplasm of colon, unspecified: Secondary | ICD-10-CM

## 2013-10-26 DIAGNOSIS — J984 Other disorders of lung: Secondary | ICD-10-CM | POA: Diagnosis not present

## 2013-10-26 LAB — GLUCOSE, CAPILLARY: GLUCOSE-CAPILLARY: 87 mg/dL (ref 70–99)

## 2013-10-26 MED ORDER — FLUDEOXYGLUCOSE F - 18 (FDG) INJECTION
13.0000 | Freq: Once | INTRAVENOUS | Status: AC | PRN
  Administered 2013-10-26: 13 via INTRAVENOUS

## 2013-10-27 ENCOUNTER — Other Ambulatory Visit: Payer: Self-pay | Admitting: Oncology

## 2013-10-27 DIAGNOSIS — C189 Malignant neoplasm of colon, unspecified: Secondary | ICD-10-CM

## 2013-10-31 ENCOUNTER — Encounter (HOSPITAL_COMMUNITY): Payer: Self-pay | Admitting: Pharmacy Technician

## 2013-11-06 ENCOUNTER — Other Ambulatory Visit: Payer: Self-pay | Admitting: Radiology

## 2013-11-07 ENCOUNTER — Ambulatory Visit (HOSPITAL_COMMUNITY)
Admission: RE | Admit: 2013-11-07 | Discharge: 2013-11-07 | Disposition: A | Discharge status: Home / Self Care | Payer: 59 | Source: Ambulatory Visit | Attending: Oncology | Admitting: Oncology

## 2013-11-07 ENCOUNTER — Encounter (HOSPITAL_COMMUNITY): Payer: Self-pay

## 2013-11-07 DIAGNOSIS — Z9221 Personal history of antineoplastic chemotherapy: Secondary | ICD-10-CM | POA: Diagnosis not present

## 2013-11-07 DIAGNOSIS — Z87891 Personal history of nicotine dependence: Secondary | ICD-10-CM | POA: Diagnosis not present

## 2013-11-07 DIAGNOSIS — Z860100 Personal history of colon polyps, unspecified: Secondary | ICD-10-CM | POA: Insufficient documentation

## 2013-11-07 DIAGNOSIS — N400 Enlarged prostate without lower urinary tract symptoms: Secondary | ICD-10-CM | POA: Insufficient documentation

## 2013-11-07 DIAGNOSIS — E785 Hyperlipidemia, unspecified: Secondary | ICD-10-CM | POA: Diagnosis not present

## 2013-11-07 DIAGNOSIS — Z9049 Acquired absence of other specified parts of digestive tract: Secondary | ICD-10-CM | POA: Diagnosis not present

## 2013-11-07 DIAGNOSIS — C189 Malignant neoplasm of colon, unspecified: Secondary | ICD-10-CM

## 2013-11-07 DIAGNOSIS — K7689 Other specified diseases of liver: Secondary | ICD-10-CM | POA: Diagnosis present

## 2013-11-07 DIAGNOSIS — I10 Essential (primary) hypertension: Secondary | ICD-10-CM | POA: Insufficient documentation

## 2013-11-07 DIAGNOSIS — Z85038 Personal history of other malignant neoplasm of large intestine: Secondary | ICD-10-CM | POA: Insufficient documentation

## 2013-11-07 DIAGNOSIS — C229 Malignant neoplasm of liver, not specified as primary or secondary: Secondary | ICD-10-CM | POA: Insufficient documentation

## 2013-11-07 LAB — CBC
HEMATOCRIT: 45.2 % (ref 39.0–52.0)
HEMOGLOBIN: 15.6 g/dL (ref 13.0–17.0)
MCH: 32.8 pg (ref 26.0–34.0)
MCHC: 34.5 g/dL (ref 30.0–36.0)
MCV: 95 fL (ref 78.0–100.0)
Platelets: 242 10*3/uL (ref 150–400)
RBC: 4.76 MIL/uL (ref 4.22–5.81)
RDW: 12.8 % (ref 11.5–15.5)
WBC: 5.1 10*3/uL (ref 4.0–10.5)

## 2013-11-07 LAB — PROTIME-INR
INR: 0.89 (ref 0.00–1.49)
Prothrombin Time: 11.9 seconds (ref 11.6–15.2)

## 2013-11-07 LAB — APTT: aPTT: 32 seconds (ref 24–37)

## 2013-11-07 MED ORDER — FENTANYL CITRATE 0.05 MG/ML IJ SOLN
INTRAMUSCULAR | Status: AC | PRN
Start: 2013-11-07 — End: 2013-11-07
  Administered 2013-11-07 (×2): 50 ug via INTRAVENOUS

## 2013-11-07 MED ORDER — SODIUM CHLORIDE 0.9 % IV SOLN
INTRAVENOUS | Status: DC
  Administered 2013-11-07: 08:00:00 via INTRAVENOUS

## 2013-11-07 MED ORDER — FENTANYL CITRATE 0.05 MG/ML IJ SOLN
INTRAMUSCULAR | Status: AC
  Filled 2013-11-07: qty 6

## 2013-11-07 MED ORDER — MIDAZOLAM HCL 2 MG/2ML IJ SOLN
INTRAMUSCULAR | Status: AC
  Filled 2013-11-07: qty 6

## 2013-11-07 MED ORDER — MIDAZOLAM HCL 2 MG/2ML IJ SOLN
INTRAMUSCULAR | Status: AC | PRN
  Administered 2013-11-07 (×2): 1 mg via INTRAVENOUS

## 2013-11-07 NOTE — Procedures (Signed)
Technically successful CT guided biopsy of hypermetabolic enlarging liver lesion.  No immediate post procedural complications.

## 2013-11-07 NOTE — Progress Notes (Signed)
Asst up to void. Steady gait . No dizziness.  Back to bed for remainder post BX stay

## 2013-11-07 NOTE — Progress Notes (Signed)
Dr. Watts in to see patient.
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
## 2013-11-07 NOTE — Discharge Instructions (Signed)
Liver Biopsy °Care After °Refer to this sheet in the next few weeks. These discharge instructions provide you with general information on caring for yourself after you leave the hospital. Your caregiver may also give you specific instructions. Your treatment has been planned according to the most current medical practices available, but unavoidable complications sometimes occur. If you have any problems or questions after discharge, please call your caregiver. °HOME CARE INSTRUCTIONS  °· You should rest for 1 to 2 days or as instructed. °· If you go home the same day as your procedure (outpatient), have a responsible adult take you home and stay with you overnight. °· Do not lift more than 5 pounds or play contact sports for 2 weeks. °· Do not drive for 24 hours after this test. °· Do not take medicine containing aspirin or drink alcohol for 1 week after this test. °· Change bandages (dressings) as directed. °· Only take over-the-counter or prescription medicines for pain, discomfort, or fever as directed by your caregiver. °OBTAINING YOUR TEST RESULTS °Not all test results are available during your visit. If your test results are not back during the visit, make an appointment with your caregiver to find out the results. Do not assume everything is normal if you have not heard from your caregiver or the medical facility. It is important for you to follow up on all of your test results. °SEEK MEDICAL CARE IF:  °· You have increased bleeding (more than a small spot) from the biopsy site. °· You have redness, swelling, or increasing pain in the biopsy site. °· You have an oral temperature above 102° F (38.9° C). °SEEK IMMEDIATE MEDICAL CARE IF:  °· You develop swelling or pain in the belly (abdomen). °· You develop a rash. °· You have difficulty breathing, feel short of breath, or feel faint. °· You develop any reaction or side effects to medicines given. °MAKE SURE YOU:  °· Understand these instructions. °· Will watch  your condition. °· Will get help right away if you are not doing well or get worse. °Document Released: 12/12/2004 Document Revised: 08/17/2011 Document Reviewed: 01/05/2008 °ExitCare® Patient Information ©2014 [employer]. °Moderate Sedation, Adult °Moderate sedation is given to help you relax or even sleep through a procedure. You may remain sleepy, be clumsy, or have poor balance for several hours following this procedure. Arrange for a responsible adult, family member, or friend to take you home. A responsible adult should stay with you for at least 24 hours or until the medicines have worn off. °· Do not participate in any activities where you could become injured for the next 24 hours, or until you feel normal again. Do not: °· Drive. °· Swim. °· Ride a bicycle. °· Operate heavy machinery. °· Cook. °· Use power tools. °· Climb ladders. °· Work at heights. °· Do not make important decisions or sign legal documents until you are improved. °· Vomiting may occur if you eat too soon. When you can drink without vomiting, try water, juice, or soup. Try solid foods if you feel little or no nausea. °· Only take over-the-counter or prescription medications for pain, discomfort, or fever as directed by your caregiver.If pain medications have been prescribed for you, ask your caregiver how soon it is safe to take them. °· Make sure you and your family fully understands everything about the medication given to you. Make sure you understand what side effects may occur. °· You should not drink alcohol, take sleeping pills, or medications that   cause drowsiness for at least 24 hours. °· If you smoke, do not smoke alone. °· If you are feeling better, you may resume normal activities 24 hours after receiving sedation. °· Keep all appointments as scheduled. Follow all instructions. °· Ask questions if you do not understand. °SEEK MEDICAL CARE IF:  °· Your skin is pale or bluish in color. °· You continue to feel sick to your  stomach (nauseous) or throw up (vomit). °· Your pain is getting worse and not helped by medication. °· You have bleeding or swelling. °· You are still sleepy or feeling clumsy after 24 hours. °SEEK IMMEDIATE MEDICAL CARE IF:  °· You develop a rash. °· You have difficulty breathing. °· You develop any type of allergic problem. °· You have a fever. °Document Released: 02/17/2001 Document Revised: 08/17/2011 Document Reviewed: 01/30/2013 °ExitCare® Patient Information ©2014 [employer]. ° °

## 2013-11-10 ENCOUNTER — Telehealth: Payer: Self-pay | Admitting: *Deleted

## 2013-11-10 NOTE — Telephone Encounter (Signed)
Spoke with patient and confirmed appointment with Dr.Byerly for 11/14/13 11:45.  Contact names, numbers, and directions were provided.

## 2013-11-14 ENCOUNTER — Other Ambulatory Visit (INDEPENDENT_AMBULATORY_CARE_PROVIDER_SITE_OTHER): Payer: Self-pay | Admitting: General Surgery

## 2013-11-14 ENCOUNTER — Ambulatory Visit (INDEPENDENT_AMBULATORY_CARE_PROVIDER_SITE_OTHER): Payer: 59 | Admitting: General Surgery

## 2013-11-14 ENCOUNTER — Encounter (INDEPENDENT_AMBULATORY_CARE_PROVIDER_SITE_OTHER): Payer: Self-pay | Admitting: General Surgery

## 2013-11-14 VITALS — BP 132/72 | HR 68 | Temp 98.0°F | Resp 18 | Ht 69.0 in | Wt 254.0 lb

## 2013-11-14 DIAGNOSIS — C189 Malignant neoplasm of colon, unspecified: Secondary | ICD-10-CM

## 2013-11-14 DIAGNOSIS — C787 Secondary malignant neoplasm of liver and intrahepatic bile duct: Secondary | ICD-10-CM

## 2013-11-14 NOTE — Assessment & Plan Note (Signed)
Difficult to assess vascular anatomy on pet CT.    Will get MRI with eovist contrast.   I am also a bit concerned about small pulmonary lesions.  Will review case at GI cancer conference after MRI.   May benefit from percutaneous technique or chemo as first treatment if pulmonary lesions are new.    If not, will consider surgery first.    Follow up with me in 2 weeks.

## 2013-11-14 NOTE — Patient Instructions (Signed)
Will get MRI of liver scheduled.    Will present MRI at GI multidisciplinary conference.  Follow up with me after MRI
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
## 2013-11-20 ENCOUNTER — Ambulatory Visit (HOSPITAL_COMMUNITY)
Admission: RE | Admit: 2013-11-20 | Discharge: 2013-11-20 | Disposition: A | Discharge status: Home / Self Care | Payer: 59 | Source: Ambulatory Visit | Attending: General Surgery | Admitting: General Surgery

## 2013-11-20 DIAGNOSIS — C787 Secondary malignant neoplasm of liver and intrahepatic bile duct: Secondary | ICD-10-CM | POA: Insufficient documentation

## 2013-11-20 DIAGNOSIS — C189 Malignant neoplasm of colon, unspecified: Secondary | ICD-10-CM

## 2013-11-20 DIAGNOSIS — N281 Cyst of kidney, acquired: Secondary | ICD-10-CM | POA: Insufficient documentation

## 2013-12-04 ENCOUNTER — Encounter (INDEPENDENT_AMBULATORY_CARE_PROVIDER_SITE_OTHER): Payer: Self-pay | Admitting: General Surgery

## 2013-12-04 ENCOUNTER — Ambulatory Visit (INDEPENDENT_AMBULATORY_CARE_PROVIDER_SITE_OTHER): Payer: 59 | Admitting: General Surgery

## 2013-12-04 VITALS — BP 124/82 | HR 77 | Temp 98.0°F | Ht 71.0 in | Wt 255.0 lb

## 2013-12-04 DIAGNOSIS — C787 Secondary malignant neoplasm of liver and intrahepatic bile duct: Secondary | ICD-10-CM

## 2013-12-04 DIAGNOSIS — C189 Malignant neoplasm of colon, unspecified: Secondary | ICD-10-CM

## 2013-12-04 NOTE — Patient Instructions (Signed)
I will see you back in late august to discuss surgery.    I will also see what Dr. Benay Spice thinks about waiting until October.

## 2013-12-05 ENCOUNTER — Ambulatory Visit (HOSPITAL_COMMUNITY): Payer: 59

## 2013-12-05 NOTE — Progress Notes (Signed)
HISTORY: Pt returns to discuss MRI.  He is s/p right hemicolectomy in 2011.  He finished chemo in 2012.  He was found to have a liver lesion that was felt to be benign, but recently started growing. It was PET avid, and bx proved adenocarcinoma.  He was referred for surgery.  He also has several small lung lesions.  He underwent MRI to look at the rest of the liver to make sure he had no other liver lesions.  This was clean.  He is doing well.  He is asymptomatic.     PERTINENT REVIEW OF SYSTEMS: Otherwise negative x 11.  Filed Vitals:   12/04/13 1448  BP: 124/82  Pulse: 77  Temp: 98 F (36.7 C)   Wt Readings from Last 3 Encounters:  12/04/13 255 lb (115.667 kg)  11/14/13 254 lb (115.214 kg)  11/07/13 254 lb (115.214 kg)    EXAM: Head: Normocephalic and atraumatic.  Eyes:  Conjunctivae are normal. Pupils are equal, round, and reactive to light. No scleral icterus.  Resp: No respiratory distress, normal effort. Abd:  Abdomen is soft, non distended and non tender. No masses are palpable.  There is no rebound and no guarding.  Neurological: Alert and oriented to person, place, and time. Coordination normal.  Skin: Skin is warm and dry. No rash noted. No diaphoretic. No erythema. No pallor.  Psychiatric: Normal mood and affect. Normal behavior. Judgment and thought content normal.      ASSESSMENT AND PLAN:   Metastatic colon cancer to liver in caudate lobe Pt does not appear to have any other liver lesions.  Will plan left hepatic lobectomy.   Discussed risks, benefits.  Also discussed possible other treatments. He does desire surgery, but he is retiring in October, and he would like to wait until then.    I will discuss with oncology whether this wait is OK.  If so, I may need to repeat imaging to make sure nothing has changed.    30 min spent in counseling, examination, and coordination of care.  >50% of time was spent in counseling.       Milus Height, MD Surgical  Oncology, [facility]  Ria Bush, MD Ria Bush, MD

## 2013-12-05 NOTE — Assessment & Plan Note (Addendum)
Pt does not appear to have any other liver lesions.  Will plan left hepatic lobectomy.   Discussed risks, benefits.  Also discussed possible other treatments. He does desire surgery, but he is retiring in October, and he would like to wait until then.    I will discuss with oncology whether this wait is OK.  If so, I may need to repeat imaging to make sure nothing has changed.    30 min spent in counseling, examination, and coordination of care.  >50% of time was spent in counseling.

## 2013-12-06 ENCOUNTER — Telehealth (INDEPENDENT_AMBULATORY_CARE_PROVIDER_SITE_OTHER): Payer: Self-pay

## 2013-12-06 NOTE — Telephone Encounter (Signed)
LMOV.  Please ask to speak with Jearld Fenton regarding this patient.

## 2013-12-11 ENCOUNTER — Telehealth (INDEPENDENT_AMBULATORY_CARE_PROVIDER_SITE_OTHER): Payer: Self-pay

## 2013-12-11 NOTE — Telephone Encounter (Signed)
Pt would like to postpone his surgery until after retirement.  Dr. Barry Dienes spoke with Dr. Benay Spice and he is concerned about the waiting period unless the patient is under his care for treatment in the interim.  Made the patient aware and he has agreed to contact Dr. Gearldine Shown office to set up a consult.

## 2013-12-12 ENCOUNTER — Telehealth: Payer: Self-pay | Admitting: *Deleted

## 2013-12-12 NOTE — Telephone Encounter (Signed)
Message from pt requesting appt to discuss timing of surgery. Reviewed with Dr. Benay Spice, appt given for 7/9 at 1030. Left message on voicemail for pt with this info.

## 2013-12-13 ENCOUNTER — Telehealth: Payer: Self-pay | Admitting: *Deleted

## 2013-12-13 NOTE — Telephone Encounter (Signed)
Left VM asking to confirm his appointment tomorrow at 1030. Called back and confirmed appointment with wife.

## 2013-12-14 ENCOUNTER — Ambulatory Visit (HOSPITAL_BASED_OUTPATIENT_CLINIC_OR_DEPARTMENT_OTHER): Payer: 59 | Admitting: Oncology

## 2013-12-14 ENCOUNTER — Other Ambulatory Visit (INDEPENDENT_AMBULATORY_CARE_PROVIDER_SITE_OTHER): Payer: Self-pay | Admitting: General Surgery

## 2013-12-14 VITALS — BP 135/85 | HR 65 | Temp 97.8°F | Resp 20 | Ht 71.0 in | Wt 252.4 lb

## 2013-12-14 DIAGNOSIS — Z85038 Personal history of other malignant neoplasm of large intestine: Secondary | ICD-10-CM

## 2013-12-14 DIAGNOSIS — K769 Liver disease, unspecified: Secondary | ICD-10-CM

## 2013-12-14 DIAGNOSIS — R918 Other nonspecific abnormal finding of lung field: Secondary | ICD-10-CM

## 2013-12-14 DIAGNOSIS — C189 Malignant neoplasm of colon, unspecified: Secondary | ICD-10-CM

## 2013-12-14 NOTE — Progress Notes (Signed)
[facility] OFFICE PROGRESS NOTE   Diagnosis: Colon cancer  INTERVAL HISTORY:   [name] returns for further discussion regarding management of the liver metastasis. A PET scan 10/26/2013 revealed a hypermetabolic hypodense caudate lobe lesion. There was a new 4 mm left upper lobe nodule. He underwent a CT biopsy of the caudate lesion on 11/07/2013. The pathology confirmed adenocarcinoma.  His case was presented at the GI tumor conference and the consensus recommendation is to proceed with surgical resection. He saw Dr. Barry Dienes. An MRI of the abdomen confirmed a single bilobed mass in the caudate lobe of the liver, unchanged from the PET scan 10/26/2013. No other liver masses or extrahepatic metastatic disease.  He feels well. Mild neuropathy symptoms persist in the fingers.   Objective:  Vital signs in last 24 hours:  Blood pressure 135/85, pulse 65, temperature 97.8 F (36.6 C), temperature source Oral, resp. rate 20, height $RemoveBe'5\' 11"'ttsNVuSeU$  (1.803 m), weight 252 lb 6.4 oz (114.488 kg).    HEENT: 1 cm cyst at the right angle of the jaw. No neck mass. Lymphatics: No cervical, supraclavicular, axillary, or inguinal nodes Resp: Lungs clear bilaterally Cardio: Regular rate and rhythm GI: No hepatosplenomegaly, nontender, no mass Vascular: No leg edema   Lab Results:  Lab Results  Component Value Date   WBC 5.1 11/07/2013   HGB 15.6 11/07/2013   HCT 45.2 11/07/2013   MCV 95.0 11/07/2013   PLT 242 11/07/2013   NEUTROABS 3.4 03/16/2013    Lab Results  Component Value Date   CEA <0.5 10/23/2013    Imaging:  I reviewed the 10/26/2013 images with Bryan Fields and his wife   Medications: I have reviewed the patient's current medications.  Assessment/Plan: 1. 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 He began treatment with FOLFOX in December 2011. He completed  cycle #12 on 10/27/2010. Oxaliplatin was held with cycles number 7 through 10 and resumed with cycle #11. 2. History of neutropenia secondary to chemotherapy. 3. History of thrombocytopenia secondary chemotherapy. 4. He did not undergo a preoperative colonoscopy. He underwent a colonoscopy on 12/19/2010 with findings of a 1 cm polyp at 90 cm from the anal verge, minimal polyp at 30 cm from the anal verge and minimal diverticulosis. Colonoscopy 03/18/2012-negative. 5. 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 He began study drug on 05/13/2010. 6. Status post Port-A-Cath placement. The Port-A-Cath has been removed. 7. 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 9. Low attenuation lesion in the caudate lobe of the liver on the CT 04/11/2012-? Cyst. MRI liver on 04/20/2012 favored the caudate lobe liver lesion to represent a cyst or complex cyst. CT abdomen/pelvis 04/17/2013 with continued enlargement of hypovascular lesion in the caudate lobe of the liver.  PET scan 10/27/2011 confirmed a hypermetabolic caudate lobe liver lesion, tiny indeterminate lung nodules, and a 9 mm level II left neck node  CT biopsy of the liver lesion 11/07/2013 confirmed adenocarcinoma 10. Iron deposition within the liver noted on MRI 04/20/2012. The ferritin level was normal on 04/17/2013 11. Pain/bleeding at the anus reported when here 04/24/2013. Status post evaluation by Dr. [name] with findings of an anal fissure. 12. Right face cystic lesion   Disposition:  Bryan Fields appears well. I discussed the CT biopsy findings with him  by telephone last month. We reviewed treatment options again today. It is very likely the liver lesion is related to the colon cancer diagnosis from 2011, but it is possible the adenocarcinoma is related to an intrahepatic  primary.  His case has been presented at the GI tumor conference on several occasions. The consensus recommendation is to proceed with surgical resection. He agrees to proceed. We will contact Dr. Barry Dienes to schedule surgery. He prefers to have surgery in August.  I suspect the tiny lung lesions are benign, these can be followed with repeat imaging.  He will return for an office visit here after surgery.  I offered him a second opinion at one of the local Medical Centers. He is comfortable proceeding with surgery here.  Bryan Coder, MD  12/14/2013  1:33 PM

## 2013-12-15 ENCOUNTER — Telehealth: Payer: Self-pay | Admitting: Oncology

## 2013-12-15 NOTE — Telephone Encounter (Signed)
lvm for pt regarding to Aug appt....mailed pt appt sched ///avs and letter °

## 2013-12-21 ENCOUNTER — Telehealth: Payer: Self-pay | Admitting: *Deleted

## 2013-12-21 NOTE — Telephone Encounter (Signed)
Left message for pt to call office. Dr.Byerly is out of the office this week. We should have more info 12/25/13.

## 2013-12-21 NOTE — Telephone Encounter (Signed)
VM from patient asking for status of MD discussion with surgeon to have surgery in August. Has not heard from anyone thus far.

## 2013-12-22 NOTE — Telephone Encounter (Signed)
Patient called again asking for status of his surgery in August per Dr. Barry Dienes. Made him aware she is out of town this week. We should hear something by next week when her staff can have her look at her schedule. Informed him I will request Marcellus Scott, GI Navigator follow up on this next week when she returns also. He appreciates information, saying "I'll try to be patient".

## 2014-01-08 ENCOUNTER — Encounter (HOSPITAL_COMMUNITY): Payer: Self-pay | Admitting: Pharmacy Technician

## 2014-01-09 ENCOUNTER — Encounter (HOSPITAL_COMMUNITY): Payer: Self-pay

## 2014-01-09 ENCOUNTER — Encounter (HOSPITAL_COMMUNITY)
Admission: RE | Admit: 2014-01-09 | Discharge: 2014-01-09 | Disposition: A | Discharge status: Home / Self Care | Payer: 59 | Source: Ambulatory Visit | Attending: General Surgery | Admitting: General Surgery

## 2014-01-09 ENCOUNTER — Ambulatory Visit (HOSPITAL_COMMUNITY)
Admission: RE | Admit: 2014-01-09 | Discharge: 2014-01-09 | Disposition: A | Discharge status: Home / Self Care | Payer: 59 | Source: Ambulatory Visit | Attending: General Surgery | Admitting: General Surgery

## 2014-01-09 DIAGNOSIS — Z01818 Encounter for other preprocedural examination: Secondary | ICD-10-CM | POA: Insufficient documentation

## 2014-01-09 LAB — COMPREHENSIVE METABOLIC PANEL
ALT: 65 U/L — ABNORMAL HIGH (ref 0–53)
AST: 43 U/L — ABNORMAL HIGH (ref 0–37)
Albumin: 3.8 g/dL (ref 3.5–5.2)
Alkaline Phosphatase: 65 U/L (ref 39–117)
Anion gap: 14 (ref 5–15)
BUN: 19 mg/dL (ref 6–23)
CO2: 22 mEq/L (ref 19–32)
Calcium: 8.6 mg/dL (ref 8.4–10.5)
Chloride: 101 mEq/L (ref 96–112)
Creatinine, Ser: 1.27 mg/dL (ref 0.50–1.35)
GFR calc Af Amer: 72 mL/min — ABNORMAL LOW (ref >90)
GFR calc non Af Amer: 62 mL/min — ABNORMAL LOW (ref >90)
Glucose, Bld: 81 mg/dL (ref 70–99)
Potassium: 4.2 mEq/L (ref 3.7–5.3)
Sodium: 137 mEq/L (ref 137–147)
Total Bilirubin: 0.6 mg/dL (ref 0.3–1.2)
Total Protein: 7.3 g/dL (ref 6.0–8.3)

## 2014-01-09 LAB — CBC WITH DIFFERENTIAL/PLATELET
BASOS ABS: 0 10*3/uL (ref 0.0–0.1)
Basophils Relative: 0 % (ref 0–1)
EOS ABS: 0.2 10*3/uL (ref 0.0–0.7)
EOS PCT: 2 % (ref 0–5)
HEMATOCRIT: 44.9 % (ref 39.0–52.0)
Hemoglobin: 15.4 g/dL (ref 13.0–17.0)
Lymphocytes Relative: 42 % (ref 12–46)
Lymphs Abs: 2.8 10*3/uL (ref 0.7–4.0)
MCH: 32.6 pg (ref 26.0–34.0)
MCHC: 34.3 g/dL (ref 30.0–36.0)
MCV: 95.1 fL (ref 78.0–100.0)
Monocytes Absolute: 0.8 10*3/uL (ref 0.1–1.0)
Monocytes Relative: 11 % (ref 3–12)
Neutro Abs: 3 10*3/uL (ref 1.7–7.7)
Neutrophils Relative %: 45 % (ref 43–77)
Platelets: 224 10*3/uL (ref 150–400)
RBC: 4.72 MIL/uL (ref 4.22–5.81)
RDW: 12.8 % (ref 11.5–15.5)
WBC: 6.8 10*3/uL (ref 4.0–10.5)

## 2014-01-09 LAB — ABO/RH: ABO/RH(D): A POS

## 2014-01-09 LAB — URINALYSIS, ROUTINE W REFLEX MICROSCOPIC
Bilirubin Urine: NEGATIVE
Glucose, UA: NEGATIVE mg/dL
Hgb urine dipstick: NEGATIVE
Ketones, ur: NEGATIVE mg/dL
Leukocytes, UA: NEGATIVE
NITRITE: NEGATIVE
PH: 5.5 (ref 5.0–8.0)
Protein, ur: NEGATIVE mg/dL
SPECIFIC GRAVITY, URINE: 1.016 (ref 1.005–1.030)
Urobilinogen, UA: 0.2 mg/dL (ref 0.0–1.0)

## 2014-01-09 LAB — PREPARE RBC (CROSSMATCH)

## 2014-01-09 LAB — PROTIME-INR
INR: 0.96 (ref 0.00–1.49)
PROTHROMBIN TIME: 12.8 s (ref 11.6–15.2)

## 2014-01-09 NOTE — Pre-Procedure Instructions (Signed)
Bryan Fields  01/09/2014   Your procedure is scheduled on:  01/15/14  Report to [facility] Admitting at 530 AM.  Call this number if you have problems the morning of surgery: [phone]   Remember:   Do not eat food or drink liquids after midnight.   Take these medicines the morning of surgery with A SIP OF WATER: atenolol   Do not wear jewelry, make-up or nail polish.  Do not wear lotions, powders, or perfumes. You may wear deodorant.  Do not shave 48 hours prior to surgery. Men may shave face and neck.  Do not bring valuables to the hospital.  [facility] is not responsible                  for any belongings or valuables.               Contacts, dentures or bridgework may not be worn into surgery.  Leave suitcase in the car. After surgery it may be brought to your room.  For patients admitted to the hospital, discharge time is determined by your                treatment team.               Patients discharged the day of surgery will not be allowed to drive  home.  Name and phone number of your driver: family  Special Instructions: Shower using CHG 2 nights before surgery and the night before surgery.  If you shower the day of surgery use CHG.  Use special wash - you have one bottle of CHG for all showers.  You should use approximately 1/3 of the bottle for each shower.   Please read over the following fact sheets that you were given: Pain Booklet, Coughing and Deep Breathing, Blood Transfusion Information and Surgical Site Infection Prevention

## 2014-01-14 MED ORDER — CEFAZOLIN SODIUM-DEXTROSE 2-3 GM-% IV SOLR
2.0000 g | INTRAVENOUS | Status: AC
  Administered 2014-01-15: 2 g via INTRAVENOUS
  Filled 2014-01-14: qty 50

## 2014-01-15 ENCOUNTER — Ambulatory Visit (HOSPITAL_COMMUNITY): Payer: 59

## 2014-01-15 ENCOUNTER — Encounter (HOSPITAL_COMMUNITY): Payer: Self-pay | Admitting: *Deleted

## 2014-01-15 ENCOUNTER — Encounter (HOSPITAL_COMMUNITY): Payer: 59 | Admitting: Anesthesiology

## 2014-01-15 ENCOUNTER — Encounter (HOSPITAL_COMMUNITY)
Admission: RE | Disposition: A | Discharge status: Home / Self Care | Payer: Self-pay | Source: Ambulatory Visit | Attending: General Surgery

## 2014-01-15 ENCOUNTER — Inpatient Hospital Stay (HOSPITAL_COMMUNITY)
Admission: RE | Admit: 2014-01-15 | Discharge: 2014-01-22 | DRG: 406 | Disposition: A | Discharge status: Home / Self Care | Payer: 59 | Source: Ambulatory Visit | Attending: General Surgery | Admitting: General Surgery

## 2014-01-15 ENCOUNTER — Ambulatory Visit (HOSPITAL_COMMUNITY): Payer: 59 | Admitting: Anesthesiology

## 2014-01-15 DIAGNOSIS — Z79899 Other long term (current) drug therapy: Secondary | ICD-10-CM

## 2014-01-15 DIAGNOSIS — Z9221 Personal history of antineoplastic chemotherapy: Secondary | ICD-10-CM

## 2014-01-15 DIAGNOSIS — K660 Peritoneal adhesions (postprocedural) (postinfection): Secondary | ICD-10-CM | POA: Diagnosis present

## 2014-01-15 DIAGNOSIS — Z9049 Acquired absence of other specified parts of digestive tract: Secondary | ICD-10-CM | POA: Diagnosis not present

## 2014-01-15 DIAGNOSIS — N138 Other obstructive and reflux uropathy: Secondary | ICD-10-CM | POA: Diagnosis present

## 2014-01-15 DIAGNOSIS — E7800 Pure hypercholesterolemia, unspecified: Secondary | ICD-10-CM | POA: Diagnosis present

## 2014-01-15 DIAGNOSIS — R339 Retention of urine, unspecified: Secondary | ICD-10-CM | POA: Diagnosis present

## 2014-01-15 DIAGNOSIS — C787 Secondary malignant neoplasm of liver and intrahepatic bile duct: Principal | ICD-10-CM | POA: Diagnosis present

## 2014-01-15 DIAGNOSIS — E669 Obesity, unspecified: Secondary | ICD-10-CM | POA: Diagnosis present

## 2014-01-15 DIAGNOSIS — Z87891 Personal history of nicotine dependence: Secondary | ICD-10-CM | POA: Diagnosis not present

## 2014-01-15 DIAGNOSIS — N401 Enlarged prostate with lower urinary tract symptoms: Secondary | ICD-10-CM | POA: Diagnosis present

## 2014-01-15 DIAGNOSIS — I10 Essential (primary) hypertension: Secondary | ICD-10-CM | POA: Diagnosis present

## 2014-01-15 DIAGNOSIS — C189 Malignant neoplasm of colon, unspecified: Secondary | ICD-10-CM | POA: Diagnosis present

## 2014-01-15 DIAGNOSIS — C182 Malignant neoplasm of ascending colon: Secondary | ICD-10-CM | POA: Diagnosis present

## 2014-01-15 HISTORY — PX: LAPAROSCOPY: SHX197

## 2014-01-15 HISTORY — PX: OPEN HEPATECTOMY [83]: SHX5986

## 2014-01-15 LAB — CBC
HCT: 40.7 % (ref 39.0–52.0)
Hemoglobin: 13.8 g/dL (ref 13.0–17.0)
MCH: 32.2 pg (ref 26.0–34.0)
MCHC: 33.9 g/dL (ref 30.0–36.0)
MCV: 94.9 fL (ref 78.0–100.0)
PLATELETS: 204 10*3/uL (ref 150–400)
RBC: 4.29 MIL/uL (ref 4.22–5.81)
RDW: 13 % (ref 11.5–15.5)
WBC: 9 10*3/uL (ref 4.0–10.5)

## 2014-01-15 LAB — CREATININE, SERUM
Creatinine, Ser: 1.03 mg/dL (ref 0.50–1.35)
GFR calc non Af Amer: 80 mL/min — ABNORMAL LOW (ref >90)

## 2014-01-15 LAB — PREPARE RBC (CROSSMATCH)

## 2014-01-15 SURGERY — LAPAROSCOPY, DIAGNOSTIC
Anesthesia: General | Site: Abdomen

## 2014-01-15 MED ORDER — VECURONIUM BROMIDE 10 MG IV SOLR
INTRAVENOUS | Status: AC
  Filled 2014-01-15: qty 10

## 2014-01-15 MED ORDER — EPHEDRINE SULFATE 50 MG/ML IJ SOLN
INTRAMUSCULAR | Status: AC
  Filled 2014-01-15: qty 1

## 2014-01-15 MED ORDER — ROCURONIUM BROMIDE 50 MG/5ML IV SOLN
INTRAVENOUS | Status: AC
  Filled 2014-01-15: qty 1

## 2014-01-15 MED ORDER — FENTANYL CITRATE 0.05 MG/ML IJ SOLN
INTRAMUSCULAR | Status: DC | PRN
  Administered 2014-01-15 (×5): 100 ug via INTRAVENOUS
  Administered 2014-01-15: 50 ug via INTRAVENOUS
  Administered 2014-01-15: 150 ug via INTRAVENOUS

## 2014-01-15 MED ORDER — NEOSTIGMINE METHYLSULFATE 10 MG/10ML IV SOLN
INTRAVENOUS | Status: AC
  Filled 2014-01-15: qty 1

## 2014-01-15 MED ORDER — STERILE WATER FOR INJECTION IJ SOLN
INTRAMUSCULAR | Status: AC
  Filled 2014-01-15: qty 10

## 2014-01-15 MED ORDER — OXYCODONE HCL 5 MG/5ML PO SOLN: 5.0000 mg | Freq: Once | ORAL | Status: DC | PRN

## 2014-01-15 MED ORDER — HYDROMORPHONE HCL PF 1 MG/ML IJ SOLN
0.5000 mg | INTRAMUSCULAR | Status: DC | PRN
  Administered 2014-01-16 (×3): 1 mg via INTRAVENOUS
  Administered 2014-01-16: 0.5 mg via INTRAVENOUS
  Administered 2014-01-16 – 2014-01-22 (×36): 1 mg via INTRAVENOUS
  Filled 2014-01-15 (×41): qty 1

## 2014-01-15 MED ORDER — ROPIVACAINE HCL 2 MG/ML IJ SOLN
2.0000 mL/h | INTRAMUSCULAR | Status: DC
  Filled 2014-01-15: qty 200

## 2014-01-15 MED ORDER — ONDANSETRON HCL 4 MG/2ML IJ SOLN
INTRAMUSCULAR | Status: DC | PRN
  Administered 2014-01-15: 4 mg via INTRAVENOUS

## 2014-01-15 MED ORDER — CETYLPYRIDINIUM CHLORIDE 0.05 % MT LIQD: 7.0000 mL | Freq: Two times a day (BID) | OROMUCOSAL | Status: DC

## 2014-01-15 MED ORDER — LABETALOL HCL 5 MG/ML IV SOLN
INTRAVENOUS | Status: AC
  Filled 2014-01-15: qty 4

## 2014-01-15 MED ORDER — FENTANYL CITRATE 0.05 MG/ML IJ SOLN
INTRAMUSCULAR | Status: AC
  Filled 2014-01-15: qty 5

## 2014-01-15 MED ORDER — LABETALOL HCL 5 MG/ML IV SOLN
INTRAVENOUS | Status: DC | PRN
  Administered 2014-01-15 (×2): 10 mg via INTRAVENOUS

## 2014-01-15 MED ORDER — 0.9 % SODIUM CHLORIDE (POUR BTL) OPTIME
TOPICAL | Status: DC | PRN
  Administered 2014-01-15 (×3): 1000 mL

## 2014-01-15 MED ORDER — GLYCOPYRROLATE 0.2 MG/ML IJ SOLN
INTRAMUSCULAR | Status: DC | PRN
  Administered 2014-01-15: 0.6 mg via INTRAVENOUS
  Administered 2014-01-15: 0.2 mg via INTRAVENOUS

## 2014-01-15 MED ORDER — ONDANSETRON HCL 4 MG/2ML IJ SOLN
INTRAMUSCULAR | Status: AC
  Filled 2014-01-15: qty 2

## 2014-01-15 MED ORDER — SUCCINYLCHOLINE CHLORIDE 20 MG/ML IJ SOLN
INTRAMUSCULAR | Status: AC
  Filled 2014-01-15: qty 1

## 2014-01-15 MED ORDER — LIDOCAINE HCL (CARDIAC) 20 MG/ML IV SOLN
INTRAVENOUS | Status: DC | PRN
  Administered 2014-01-15: 80 mg via INTRAVENOUS

## 2014-01-15 MED ORDER — PHENYLEPHRINE HCL 10 MG/ML IJ SOLN
10.0000 mg | INTRAVENOUS | Status: DC | PRN
  Administered 2014-01-15: 50 ug/min via INTRAVENOUS

## 2014-01-15 MED ORDER — EVICEL 5 ML EX KIT
PACK | CUTANEOUS | Status: AC
  Filled 2014-01-15: qty 1

## 2014-01-15 MED ORDER — KCL IN DEXTROSE-NACL 20-5-0.45 MEQ/L-%-% IV SOLN
INTRAVENOUS | Status: DC
  Administered 2014-01-15 – 2014-01-21 (×12): via INTRAVENOUS
  Filled 2014-01-15 (×20): qty 1000

## 2014-01-15 MED ORDER — SIMVASTATIN 40 MG PO TABS
40.0000 mg | ORAL_TABLET | Freq: Every day | ORAL | Status: DC
  Administered 2014-01-15 – 2014-01-21 (×7): 40 mg via ORAL
  Filled 2014-01-15 (×10): qty 1

## 2014-01-15 MED ORDER — LACTATED RINGERS IV SOLN
INTRAVENOUS | Status: DC | PRN
  Administered 2014-01-15 (×2): via INTRAVENOUS

## 2014-01-15 MED ORDER — ROPIVACAINE HCL 2 MG/ML IJ SOLN
INTRAMUSCULAR | Status: DC | PRN
  Administered 2014-01-15: 8 mL/h via EPIDURAL

## 2014-01-15 MED ORDER — ATENOLOL 50 MG PO TABS
50.0000 mg | ORAL_TABLET | Freq: Every morning | ORAL | Status: DC
  Administered 2014-01-16 – 2014-01-22 (×7): 50 mg via ORAL
  Filled 2014-01-15 (×7): qty 1

## 2014-01-15 MED ORDER — MIDAZOLAM HCL 2 MG/2ML IJ SOLN
INTRAMUSCULAR | Status: AC
  Filled 2014-01-15: qty 2

## 2014-01-15 MED ORDER — MIDAZOLAM HCL 5 MG/5ML IJ SOLN
INTRAMUSCULAR | Status: DC | PRN
  Administered 2014-01-15: 2 mg via INTRAVENOUS

## 2014-01-15 MED ORDER — SODIUM CHLORIDE 0.9 % IJ SOLN
INTRAMUSCULAR | Status: AC
  Filled 2014-01-15: qty 10

## 2014-01-15 MED ORDER — KCL IN DEXTROSE-NACL 20-5-0.45 MEQ/L-%-% IV SOLN
INTRAVENOUS | Status: AC
  Filled 2014-01-15: qty 1000

## 2014-01-15 MED ORDER — CETYLPYRIDINIUM CHLORIDE 0.05 % MT LIQD
7.0000 mL | Freq: Two times a day (BID) | OROMUCOSAL | Status: DC
  Administered 2014-01-15 – 2014-01-22 (×13): 7 mL via OROMUCOSAL

## 2014-01-15 MED ORDER — EVICEL 5 ML EX KIT
PACK | CUTANEOUS | Status: DC | PRN
  Administered 2014-01-15: 5 mL

## 2014-01-15 MED ORDER — BUPIVACAINE-EPINEPHRINE (PF) 0.25% -1:200000 IJ SOLN
INTRAMUSCULAR | Status: AC
  Filled 2014-01-15: qty 30

## 2014-01-15 MED ORDER — ONDANSETRON HCL 4 MG/2ML IJ SOLN
4.0000 mg | Freq: Four times a day (QID) | INTRAMUSCULAR | Status: DC | PRN
Start: 2014-01-15 — End: 2014-01-22

## 2014-01-15 MED ORDER — LIDOCAINE-EPINEPHRINE (PF) 2 %-1:200000 IJ SOLN
INTRAMUSCULAR | Status: DC | PRN
  Administered 2014-01-15: 5 mL
  Administered 2014-01-15: 3 mL

## 2014-01-15 MED ORDER — EPHEDRINE SULFATE 50 MG/ML IJ SOLN
INTRAMUSCULAR | Status: DC | PRN
Start: 2014-01-15 — End: 2014-01-15
  Administered 2014-01-15 (×2): 10 mg via INTRAVENOUS
  Administered 2014-01-15: 5 mg via INTRAVENOUS

## 2014-01-15 MED ORDER — LIDOCAINE HCL (CARDIAC) 20 MG/ML IV SOLN
INTRAVENOUS | Status: AC
  Filled 2014-01-15: qty 5

## 2014-01-15 MED ORDER — HYDROMORPHONE HCL PF 1 MG/ML IJ SOLN
INTRAMUSCULAR | Status: AC
  Filled 2014-01-15: qty 1

## 2014-01-15 MED ORDER — DEXAMETHASONE SODIUM PHOSPHATE 10 MG/ML IJ SOLN
INTRAMUSCULAR | Status: DC | PRN
  Administered 2014-01-15: 10 mg via INTRAVENOUS

## 2014-01-15 MED ORDER — OXYCODONE HCL 5 MG PO TABS: 5.0000 mg | ORAL_TABLET | Freq: Once | ORAL | Status: DC | PRN

## 2014-01-15 MED ORDER — LIDOCAINE HCL (PF) 1 % IJ SOLN
INTRAMUSCULAR | Status: AC
  Filled 2014-01-15: qty 30

## 2014-01-15 MED ORDER — PHENYLEPHRINE 40 MCG/ML (10ML) SYRINGE FOR IV PUSH (FOR BLOOD PRESSURE SUPPORT)
PREFILLED_SYRINGE | INTRAVENOUS | Status: AC
  Filled 2014-01-15: qty 10

## 2014-01-15 MED ORDER — HEPARIN SODIUM (PORCINE) 5000 UNIT/ML IJ SOLN
5000.0000 [IU] | Freq: Three times a day (TID) | INTRAMUSCULAR | Status: DC
  Administered 2014-01-16 – 2014-01-22 (×18): 5000 [IU] via SUBCUTANEOUS
  Filled 2014-01-15 (×26): qty 1

## 2014-01-15 MED ORDER — ROPIVACAINE HCL 2 MG/ML IJ SOLN
8.0000 mL/h | INTRAMUSCULAR | Status: DC
  Administered 2014-01-16 – 2014-01-18 (×3): 8 mL/h via EPIDURAL
  Filled 2014-01-15 (×9): qty 200

## 2014-01-15 MED ORDER — NEOSTIGMINE METHYLSULFATE 10 MG/10ML IV SOLN
INTRAVENOUS | Status: DC | PRN
  Administered 2014-01-15: 4 mg via INTRAVENOUS

## 2014-01-15 MED ORDER — LACTATED RINGERS IV SOLN
INTRAVENOUS | Status: DC | PRN
  Administered 2014-01-15 (×2): via INTRAVENOUS

## 2014-01-15 MED ORDER — ACETAMINOPHEN 10 MG/ML IV SOLN
1000.0000 mg | Freq: Three times a day (TID) | INTRAVENOUS | Status: AC
  Administered 2014-01-15 – 2014-01-16 (×3): 1000 mg via INTRAVENOUS
  Filled 2014-01-15 (×4): qty 100

## 2014-01-15 MED ORDER — STERILE WATER FOR IRRIGATION IR SOLN
Status: DC | PRN
  Administered 2014-01-15: 1000 mL

## 2014-01-15 MED ORDER — GLYCOPYRROLATE 0.2 MG/ML IJ SOLN
INTRAMUSCULAR | Status: AC
  Filled 2014-01-15: qty 3

## 2014-01-15 MED ORDER — ACETAMINOPHEN 10 MG/ML IV SOLN
INTRAVENOUS | Status: AC
  Filled 2014-01-15: qty 100

## 2014-01-15 MED ORDER — LIDOCAINE HCL 1 % IJ SOLN
INTRAMUSCULAR | Status: DC | PRN
  Administered 2014-01-15: 09:00:00 via INTRAMUSCULAR

## 2014-01-15 MED ORDER — HYDROMORPHONE HCL PF 1 MG/ML IJ SOLN
0.2500 mg | INTRAMUSCULAR | Status: DC | PRN
  Administered 2014-01-15 (×2): 0.5 mg via INTRAVENOUS

## 2014-01-15 MED ORDER — PROPOFOL 10 MG/ML IV BOLUS
INTRAVENOUS | Status: DC | PRN
  Administered 2014-01-15: 50 mg via INTRAVENOUS

## 2014-01-15 MED ORDER — PROPOFOL 10 MG/ML IV BOLUS
INTRAVENOUS | Status: AC
  Filled 2014-01-15: qty 20

## 2014-01-15 MED ORDER — DEXTROSE 5 % IV SOLN
1.0000 g | Freq: Four times a day (QID) | INTRAVENOUS | Status: AC
  Administered 2014-01-15 – 2014-01-16 (×3): 1 g via INTRAVENOUS
  Filled 2014-01-15 (×3): qty 1

## 2014-01-15 MED ORDER — ROCURONIUM BROMIDE 100 MG/10ML IV SOLN
INTRAVENOUS | Status: DC | PRN
  Administered 2014-01-15: 50 mg via INTRAVENOUS

## 2014-01-15 MED ORDER — VECURONIUM BROMIDE 10 MG IV SOLR
INTRAVENOUS | Status: DC | PRN
  Administered 2014-01-15: 1 mg via INTRAVENOUS
  Administered 2014-01-15 (×2): 2 mg via INTRAVENOUS
  Administered 2014-01-15: 4 mg via INTRAVENOUS
  Administered 2014-01-15: 2 mg via INTRAVENOUS

## 2014-01-15 MED ORDER — ONDANSETRON HCL 4 MG PO TABS
4.0000 mg | ORAL_TABLET | Freq: Four times a day (QID) | ORAL | Status: DC | PRN
Start: 2014-01-15 — End: 2014-01-22

## 2014-01-15 SURGICAL SUPPLY — 99 items
APL SRG 32X5 SNPLK LF DISP (MISCELLANEOUS)
BAG SPEC RTRVL LRG 6X4 10 (ENDOMECHANICALS)
BLADE SURG 10 STRL SS (BLADE) ×4 IMPLANT
BLADE SURG ROTATE 9660 (MISCELLANEOUS) ×4 IMPLANT
BOOT SUTURE AID YELLOW STND (SUTURE) ×4 IMPLANT
CANISTER SUCTION 2500CC (MISCELLANEOUS) IMPLANT
CHLORAPREP W/TINT 26ML (MISCELLANEOUS) ×4 IMPLANT
CLIP TI LARGE 6 (CLIP) ×4 IMPLANT
CLIP TI MEDIUM 24 (CLIP) ×4 IMPLANT
CONT SPEC 4OZ CLIKSEAL STRL BL (MISCELLANEOUS) ×4 IMPLANT
CONT SPECI 4OZ STER CLIK (MISCELLANEOUS) ×4 IMPLANT
COVER SURGICAL LIGHT HANDLE (MISCELLANEOUS) ×4 IMPLANT
COVER TABLE BACK 60X90 (DRAPES) ×4 IMPLANT
CUTTER FLEX LINEAR 45M (STAPLE) ×4 IMPLANT
DRAIN CHANNEL 19F RND (DRAIN) ×4 IMPLANT
DRAIN PENROSE 1/2X36 STERILE (WOUND CARE) IMPLANT
DRAPE UTILITY 15X26 W/TAPE STR (DRAPE) ×8 IMPLANT
DRAPE WARM FLUID 44X44 (DRAPE) ×8 IMPLANT
DRSG COVADERM 4X10 (GAUZE/BANDAGES/DRESSINGS) ×4 IMPLANT
DRSG COVADERM 4X14 (GAUZE/BANDAGES/DRESSINGS) ×4 IMPLANT
ELECT BLADE 6.5 EXT (BLADE) ×4 IMPLANT
ELECT CAUTERY BLADE 6.4 (BLADE) ×4 IMPLANT
ELECT REM PT RETURN 9FT ADLT (ELECTROSURGICAL) ×4
ELECTRODE REM PT RTRN 9FT ADLT (ELECTROSURGICAL) ×2 IMPLANT
EVACUATOR SILICONE 100CC (DRAIN) ×4 IMPLANT
GAUZE SPONGE 4X4 16PLY XRAY LF (GAUZE/BANDAGES/DRESSINGS) ×4 IMPLANT
GLOVE BIO SURGEON STRL SZ 6 (GLOVE) ×8 IMPLANT
GLOVE BIO SURGEON STRL SZ7 (GLOVE) ×8 IMPLANT
GLOVE BIO SURGEON STRL SZ7.5 (GLOVE) ×8 IMPLANT
GLOVE BIO SURGEON STRL SZ8 (GLOVE) ×4 IMPLANT
GLOVE BIOGEL PI IND STRL 6.5 (GLOVE) ×2 IMPLANT
GLOVE BIOGEL PI IND STRL 7.5 (GLOVE) ×10 IMPLANT
GLOVE BIOGEL PI IND STRL 8 (GLOVE) ×2 IMPLANT
GLOVE BIOGEL PI INDICATOR 6.5 (GLOVE) ×2
GLOVE BIOGEL PI INDICATOR 7.5 (GLOVE) ×10
GLOVE BIOGEL PI INDICATOR 8 (GLOVE) ×2
GLOVE ECLIPSE 7.5 STRL STRAW (GLOVE) ×8 IMPLANT
GLOVE INDICATOR 6.5 STRL GRN (GLOVE) ×4 IMPLANT
GLOVE SURG SS PI 7.0 STRL IVOR (GLOVE) ×4 IMPLANT
GOWN STRL REUS W/ TWL LRG LVL3 (GOWN DISPOSABLE) ×12 IMPLANT
GOWN STRL REUS W/ TWL XL LVL3 (GOWN DISPOSABLE) ×2 IMPLANT
GOWN STRL REUS W/TWL 2XL LVL3 (GOWN DISPOSABLE) ×4 IMPLANT
GOWN STRL REUS W/TWL LRG LVL3 (GOWN DISPOSABLE) ×24
GOWN STRL REUS W/TWL XL LVL3 (GOWN DISPOSABLE) ×4
HAND PENCIL TRP OPTION (MISCELLANEOUS) ×4 IMPLANT
HEMOSTAT SNOW SURGICEL 2X4 (HEMOSTASIS) IMPLANT
KIT BASIN OR (CUSTOM PROCEDURE TRAY) ×4 IMPLANT
KIT ROOM TURNOVER OR (KITS) ×4 IMPLANT
LOOP VESSEL MAXI BLUE (MISCELLANEOUS) ×8 IMPLANT
NS IRRIG 1000ML POUR BTL (IV SOLUTION) ×8 IMPLANT
PAD ARMBOARD 7.5X6 YLW CONV (MISCELLANEOUS) ×8 IMPLANT
PENCIL BUTTON HOLSTER BLD 10FT (ELECTRODE) ×4 IMPLANT
POUCH SPECIMEN RETRIEVAL 10MM (ENDOMECHANICALS) IMPLANT
RELOAD 45 VASCULAR/THIN (ENDOMECHANICALS) ×8 IMPLANT
RELOAD BLUE (STAPLE) IMPLANT
RELOAD WHITE ECR60W (STAPLE) ×12 IMPLANT
SCALPEL HARMONIC ACE (MISCELLANEOUS) IMPLANT
SCISSORS HARMONIC WAVE 18CM (INSTRUMENTS) ×4 IMPLANT
SCISSORS LAP 5X35 DISP (ENDOMECHANICALS) ×4 IMPLANT
SEALANT SURGICAL APPL DUAL CAN (MISCELLANEOUS) IMPLANT
SET IRRIG TUBING LAPAROSCOPIC (IRRIGATION / IRRIGATOR) IMPLANT
SHEARS FOC LG CVD HARMONIC 17C (MISCELLANEOUS) ×4 IMPLANT
SLEEVE ENDOPATH XCEL 5M (ENDOMECHANICALS) ×8 IMPLANT
SPECIMEN JAR LARGE (MISCELLANEOUS) ×4 IMPLANT
SPONGE GAUZE 4X4 12PLY STER LF (GAUZE/BANDAGES/DRESSINGS) ×4 IMPLANT
SPONGE LAP 18X18 X RAY DECT (DISPOSABLE) ×16 IMPLANT
STAPLE ECHEON FLEX 60 POW ENDO (STAPLE) ×4 IMPLANT
STAPLER STANDARD HANDLE (STAPLE) IMPLANT
STAPLER VISISTAT 35W (STAPLE) ×4 IMPLANT
SURGILUBE 3G PEEL PACK STRL (MISCELLANEOUS) ×4 IMPLANT
SUT ETHILON 1 LR 30 (SUTURE) ×4 IMPLANT
SUT ETHILON 2 0 FS 18 (SUTURE) ×4 IMPLANT
SUT MNCRL AB 4-0 PS2 18 (SUTURE) IMPLANT
SUT PDS AB 1 TP1 96 (SUTURE) ×20 IMPLANT
SUT PDS II 0 TP-1 LOOPED 60 (SUTURE) IMPLANT
SUT PROLENE 3 0 SH 48 (SUTURE) ×8 IMPLANT
SUT PROLENE 4 0 RB 1 (SUTURE) ×8
SUT PROLENE 4-0 RB1 18X2 ARM (SUTURE) ×4 IMPLANT
SUT SILK 0 TIES 10X30 (SUTURE) ×4 IMPLANT
SUT VIC AB 2-0 CT1 27 (SUTURE) ×32
SUT VIC AB 2-0 CT1 TAPERPNT 27 (SUTURE) ×16 IMPLANT
SUT VIC AB 2-0 SH 18 (SUTURE) ×4 IMPLANT
SUT VIC AB 3-0 SH 18 (SUTURE) ×4 IMPLANT
SUT VICRYL AB 2 0 TIES (SUTURE) ×4 IMPLANT
SUT VICRYL AB 3 0 TIES (SUTURE) ×4 IMPLANT
SYS LAPSCP GELPORT 120MM (MISCELLANEOUS)
SYSTEM LAPSCP GELPORT 120MM (MISCELLANEOUS) IMPLANT
TAPE CLOTH SURG 6X10 WHT LF (GAUZE/BANDAGES/DRESSINGS) ×4 IMPLANT
TOWEL OR 17X24 6PK STRL BLUE (TOWEL DISPOSABLE) ×4 IMPLANT
TOWEL OR 17X26 10 PK STRL BLUE (TOWEL DISPOSABLE) ×4 IMPLANT
TRAY FOLEY CATH 16FRSI W/METER (SET/KITS/TRAYS/PACK) ×4 IMPLANT
TRAY LAPAROSCOPIC (CUSTOM PROCEDURE TRAY) ×4 IMPLANT
TROCAR XCEL 12X100 BLDLESS (ENDOMECHANICALS) IMPLANT
TROCAR XCEL BLUNT TIP 100MML (ENDOMECHANICALS) IMPLANT
TROCAR XCEL NON-BLD 5MMX100MML (ENDOMECHANICALS) ×4 IMPLANT
TUBE CONNECTING 12'X1/4 (SUCTIONS) ×1
TUBE CONNECTING 12X1/4 (SUCTIONS) ×3 IMPLANT
TUBING FILTER THERMOFLATOR (ELECTROSURGICAL) ×4 IMPLANT
YANKAUER SUCT BULB TIP NO VENT (SUCTIONS) ×4 IMPLANT

## 2014-01-15 NOTE — Transfer of Care (Signed)
Immediate Anesthesia Transfer of Care Note  Patient: Bryan Fields  Procedure(s) Performed: Procedure(s): LAPAROSCOPY DIAGNOSTIC (N/A) OPEN HEPATECTOMY (N/A)  Patient Location: PACU  Anesthesia Type:General  Level of Consciousness: awake, oriented, patient cooperative and responds to stimulation  Airway & Oxygen Therapy: Patient Spontanous Breathing and Patient connected to face mask oxygen  Post-op Assessment: Report given to PACU RN, Post -op Vital signs reviewed and stable and Patient moving all extremities  Post vital signs: Reviewed and stable  Complications: No apparent anesthesia complications

## 2014-01-15 NOTE — Addendum Note (Signed)
Addendum created 01/15/14 1342 by Laurie Panda, MD   Modules edited: Orders, PRL Based Order Sets
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
## 2014-01-15 NOTE — Discharge Instructions (Addendum)
[facility]      [facility] °[phone] ° °ABDOMINAL SURGERY: POST OP INSTRUCTIONS ° °Always review your discharge instruction sheet given to you by the facility where your surgery was performed. ° °IF YOU HAVE DISABILITY OR FAMILY LEAVE FORMS, YOU MUST BRING THEM TO THE OFFICE FOR PROCESSING.  PLEASE DO NOT GIVE THEM TO YOUR DOCTOR. ° °1. A prescription for pain medication may be given to you upon discharge.  Take your pain medication as prescribed, if needed.  If narcotic pain medicine is not needed, then you may take acetaminophen (Tylenol) or ibuprofen (Advil) as needed. °2. Take your usually prescribed medications unless otherwise directed. °3. If you need a refill on your pain medication, please contact your pharmacy. They will contact our office to request authorization.  Prescriptions will not be filled after 5pm or on week-ends. °4. You should follow a light diet the first few days after arrival home, such as soup and crackers, pudding, etc.unless your doctor has advised otherwise. A high-fiber, low fat diet can be resumed as tolerated.   Be sure to include lots of fluids daily. Most patients will experience some swelling and bruising on the chest and neck area.  Ice packs will help.  Swelling and bruising can take several days to resolve °5. Most patients will experience some swelling and bruising in the area of the incision. Ice pack will help. Swelling and bruising can take several days to resolve..  °6. It is common to experience some constipation if taking pain medication after surgery.  Increasing fluid intake and taking a stool softener will usually help or prevent this problem from occurring.  A mild laxative (Milk of Magnesia or Miralax) should be taken according to package directions if there are no bowel movements after 48 hours. °7.  You may have steri-strips (small skin tapes) in place directly over the incision.  These strips should be left on the skin for 10-14 days.  If your  surgeon used skin glue on the incision, you may shower in 48 hours.  The glue will flake off over the next 2-3 weeks.  Any sutures or staples will be removed at the office during your follow-up visit. You may find that a light gauze bandage over your incision may keep your staples from being rubbed or pulled. You may shower and replace the bandage daily. °8. ACTIVITIES:  You may resume regular (light) daily activities beginning the next day--such as daily self-care, walking, climbing stairs--gradually increasing activities as tolerated.  You may have sexual intercourse when it is comfortable.  Refrain from any heavy lifting or straining until approved by your doctor. °a. You may drive when you no longer are taking prescription pain medication, you can comfortably wear a seatbelt, and you can safely maneuver your car and apply brakes °b. Return to Work: __________12 weeks if applicable_________________________ °9. You should see your doctor in the office for a follow-up appointment approximately two weeks after your surgery.  Make sure that you call for this appointment within a day or two after you arrive home to insure a convenient appointment time. °OTHER INSTRUCTIONS:  °_____________________________________________________________ °_____________________________________________________________ ° °WHEN TO CALL YOUR DOCTOR: °1. Fever over 101.0 °2. Inability to urinate °3. Nausea and/or vomiting °4. Extreme swelling or bruising °5. Continued bleeding from incision. °6. Increased pain, redness, or drainage from the incision. °7. Difficulty swallowing or breathing °8. Muscle cramping or spasms. °9. Numbness or tingling in hands or feet or around lips. ° °The clinic staff is   available to answer your questions during regular business hours.  Please dont hesitate to call and ask to speak to one of the nurses if you have concerns.  For further questions, please visit [email or web address]   WOUND CARE  It  is important that the wound be kept open.   -Keeping the skin edges apart will allow the wound to gradually heal from the base upwards.   - If the skin edges of the wound close too early, a new fluid pocket can form and infection can occur. -This is the reason to pack deeper wounds with gauze or ribbon -This is why drained wounds cannot be sewed closed right away  A healthy wound should form a lining of bright red "beefy" granulating tissue that will help shrink the wound and help the edges grow new skin into it.   -A little mucus / yellow discharge is normal (the body's natural way to try and form a scab) and should be gently washed off with soap and water with daily dressing changes.  -Green or foul smelling drainage implies bacterial colonization and can slow wound healing.  Call the doctor if it does not improve or worsens  -Avoid use of antibiotic ointments for more than a week as they can slow wound healing over time.    -Sometimes other wound care products will be used to reduce need for dressing changes and/or help clean up dirty wounds -Sometimes the surgeon needs to debride the wound in the office to remove dead or infected tissue out of the wound so it can heal more quickly and safely.    Change the dressing at least once a day -Wash the wound with mild soap and water gently every day.  It is good to shower or bathe the wound to help it clean out. -Use clean 4x4 gauze for medium/large wounds that is soaked in sterile water or saline (it does not need to be sterile, just clean) -Keep the raw wound moist with a little saline or KY (saline) gel on the gauze.  -A dry wound will take longer to heal.  -Keep the skin dry around the wound to prevent breakdown and irritation. -Pack the wound down to the base -The goal is to keep the skin apart, not overpack the wound -Use a Q-tip or blunt-tipped kabob stick toothpick to push the gauze down to the base in narrow or deep wounds   -Cover with a  clean gauze and tape -paper or Medipore tape tend to be gentle on the skin -rotate the orientation of the tape to avoid repeated stress/trauma on the skin -using an ACE or Coban wrap on wounds on arms or legs can be used instead.   Returning the see the surgeon is helpful to follow the healing process and help the wound close as fast as possible.

## 2014-01-15 NOTE — Anesthesia Preprocedure Evaluation (Signed)
Anesthesia Evaluation  Patient identified by MRN, date of birth, ID band Patient awake    Reviewed: Allergy & Precautions, H&P , NPO status , Patient's Chart, lab work & pertinent test results  History of Anesthesia Complications Negative for: history of anesthetic complications  Airway Mallampati: III TM Distance: >3 FB Neck ROM: Full    Dental  (+) Teeth Intact,    Pulmonary neg sleep apnea, neg COPDneg recent URI, former smoker,  breath sounds clear to auscultation        Cardiovascular hypertension, Pt. on medications and Pt. on home beta blockers - angina- Past MI and - CHF - dysrhythmias - Valvular Problems/MurmursRhythm:Regular     Neuro/Psych negative neurological ROS  negative psych ROS   GI/Hepatic Liver mets from colon Ca coloonc CA s/p resection with mets to liver   Endo/Other  negative endocrine ROS  Renal/GU negative Renal ROS     Musculoskeletal negative musculoskeletal ROS (+)   Abdominal   Peds  Hematology negative hematology ROS (+)   Anesthesia Other Findings   Reproductive/Obstetrics                           Anesthesia Physical Anesthesia Plan  ASA: II  Anesthesia Plan: General   Post-op Pain Management:    Induction: Intravenous  Airway Management Planned: Oral ETT  Additional Equipment: Arterial line and CVP  Intra-op Plan:   Post-operative Plan: Extubation in OR and Possible Post-op intubation/ventilation  Informed Consent: I have reviewed the patients History and Physical, chart, labs and discussed the procedure including the risks, benefits and alternatives for the proposed anesthesia with the patient or authorized representative who has indicated his/her understanding and acceptance.   Dental advisory given  Plan Discussed with: CRNA and Surgeon  Anesthesia Plan Comments:         Anesthesia Quick Evaluation

## 2014-01-15 NOTE — Anesthesia Postprocedure Evaluation (Signed)
  Anesthesia Post-op Note  Patient: Bryan Fields  Procedure(s) Performed: Procedure(s): LAPAROSCOPY DIAGNOSTIC (N/A) OPEN HEPATECTOMY (N/A)  Patient Location: PACU  Anesthesia Type:General and Epidural  Level of Consciousness: awake and alert   Airway and Oxygen Therapy: Patient Spontanous Breathing  Post-op Pain: mild  Post-op Assessment: Post-op Vital signs reviewed, Patient's Cardiovascular Status Stable, Respiratory Function Stable, Patent Airway, No signs of Nausea or vomiting and Pain level controlled  Post-op Vital Signs: Reviewed and stable  Last Vitals:  Filed Vitals:   01/15/14 1315  BP: 108/67  Pulse: 63  Temp:   Resp: 18    Complications: No apparent anesthesia complications

## 2014-01-15 NOTE — Progress Notes (Signed)
Pt came to  PACU with epidural intact, dsg is CDI,  0.2 % Naporin infusion at 8 cc/hr which is the rate Dr Ermalene Postin wants it to infuse at.

## 2014-01-15 NOTE — Op Note (Addendum)
PREOPERATIVE DIAGNOSIS:  Metastatic colon cancer to liver.  POSTOPERATIVE DIAGNOSIS:  same  PROCEDURE PERFORMED:  Diagnostic laparoscopy, left hepatic lobectomy, portal lymph node dissection  SURGEON:  Stark Klein, MD  ASSISTANT:  Christie Beckers, MD  ANESTHESIA:  General and local.  FINDINGS:  Mass as expected in caudate lobe.  SPECIMEN:  Left lobe of the liver to Pathology, caudate lobe, portal lymph nodes  ESTIMATED BLOOD LOSS:  100 mL.  COMPLICATIONS:  Unknown.  PROCEDURE:  Patient was identified in the holding area and taken to the operating room where he was placed supine on the operating room table.  An epidural was placed by Dr. Ermalene Postin.  General endotracheal anesthesia was induced.  The abdomen was prepped and draped in a sterile fashion.  A time-out was performed according to the surgical safety check list.  When all was correct, we continued.    The left subcostal region was anesthetized with local anesthetic and a small 6 mm incision was made with a #11 blade. Optiview port was placed without difficulty.  Pneumoperitoneum was achieved.  The camera was placed in the abdomen and no overt signs of carcinomatosis were seen.  There were numerous adhesions from previous surgery, and these were taken down bluntly with a grasper and sharply with scissors.  No evidence of carcinomatosis was seen.    A chevron incision was made with the longer portion to the right.  The subcutaneous tissues were divided with the Bovie and then the fascia was opened with the Bovie as well. There were several spots on the muscle that were coagulated.  The Bookwalter was then placed for assistance with visualization.  There was a small spot of bleeding from the omentum which had been adherent to the abdominal wall and this was stopped with the Bovie.    The adhesions of the greater omentum to the liver from the prior right hemicolectomy were taken down with the Bovie with assistance of the Overholdt  clamp. The falciform was taken down with the harmonic.   Once the porta hepatis was exposed, the liver was retracted gently with sweetheart retractor.  The gastrohepatic ligament was opened.  There was no sign of any replaced left hepatic artery or replaced right hepatic artery.  The right hepatic artery was identified as well as the left.  The bifurcation was reasonably low.  The left hepatic artery was test clamped and the right hepatic artery was traced out with the doppler.  A vessel loop was passed around the main hepatic artery as well as the left hepatic artery.  Attention was then directed to the liver.  The triangular ligament was taken down to the left.  The left hepatic vein was isolated and a vessel loop was passed around this.  This was done very painstakingly with a blunt right angle clamp in combination with Metzenbaum scissors.     The left hepatic artery was doubly tied, clipped, then divided with metzenbaum scissors.  The left portal vein was then able to be isolated.   The left portal vein and the left hepatic vein were stapled with the 45 mm vascular stapler.   The line of demarcation was seen.  The liver was scored at the line of demarcation with the Bovie.  The harmonic scalpel wave was then used to divide the bulk of the liver parenchyma.  The vascular pedicles were divided with Echelon staple loads.  The left liver was taken completely off, except for the caudate which was quite isolated from  the left lateral segment.  The tumor was apparent in the caudate lobe.  Sutures were placed for retraction on the caudate.  The caudate was then taken off the vena cava painstakingly.  Small metal clips were placed in the bridging veins.  The harmonic was used to complete most of the resection.  An additional staple load was used to divide the pedicle entering the caudate.  This was then irrigated with water. The argon beam coagulator was used to coagulate the liver bed. This also was  irrigated.  There was no bile leakage seen after waiting and evaluating the liver bed with a dry wrap.  There was a small bleeding vessel on the cava which was clipped.  The liver parenchymas was still oozing just a bit.  Pressure was held in this location for approximately 5 minutes.  No additional bleeding was seen.  Evicel was placed on the liver edge.  The falciform was used as a pedicled flap over the edge.  A 19 Blake drain was placed in the abdomen.  The omentum was also placed up over the top of the colon into this area.  The [place] drain was then in place with a 2-0 nylon.   The fascia was closed with #1 looped PDS suture in 2 layers on the lateral portions.     The skin was then irrigated and stapled.  One of the laparoscopic port sites were used for the drain and the other was stapled as well.  The wounds were cleaned, dried and dressed with Tegaderm.  The patient was extubated and taken to PACU in stable condition.     Stark Klein, MD

## 2014-01-16 LAB — PROTIME-INR
INR: 1.03 (ref 0.00–1.49)
Prothrombin Time: 13.5 seconds (ref 11.6–15.2)

## 2014-01-16 LAB — PHOSPHORUS: PHOSPHORUS: 3 mg/dL (ref 2.3–4.6)

## 2014-01-16 LAB — COMPREHENSIVE METABOLIC PANEL
ALBUMIN: 2.9 g/dL — AB (ref 3.5–5.2)
ALT: 317 U/L — ABNORMAL HIGH (ref 0–53)
AST: 267 U/L — ABNORMAL HIGH (ref 0–37)
Alkaline Phosphatase: 55 U/L (ref 39–117)
Anion gap: 9 (ref 5–15)
BUN: 15 mg/dL (ref 6–23)
CALCIUM: 8 mg/dL — AB (ref 8.4–10.5)
CO2: 24 mEq/L (ref 19–32)
Chloride: 105 mEq/L (ref 96–112)
Creatinine, Ser: 1.07 mg/dL (ref 0.50–1.35)
GFR calc Af Amer: 88 mL/min — ABNORMAL LOW (ref >90)
GFR calc non Af Amer: 76 mL/min — ABNORMAL LOW (ref >90)
Glucose, Bld: 127 mg/dL — ABNORMAL HIGH (ref 70–99)
POTASSIUM: 4.6 meq/L (ref 3.7–5.3)
Sodium: 138 mEq/L (ref 137–147)
TOTAL PROTEIN: 6 g/dL (ref 6.0–8.3)
Total Bilirubin: 1 mg/dL (ref 0.3–1.2)

## 2014-01-16 LAB — TYPE AND SCREEN
ABO/RH(D): A POS
ANTIBODY SCREEN: NEGATIVE
UNIT DIVISION: 0
UNIT DIVISION: 0
UNIT DIVISION: 0
Unit division: 0
Unit division: 0
Unit division: 0

## 2014-01-16 LAB — CBC
HEMATOCRIT: 40.4 % (ref 39.0–52.0)
Hemoglobin: 13.8 g/dL (ref 13.0–17.0)
MCH: 32.9 pg (ref 26.0–34.0)
MCHC: 34.2 g/dL (ref 30.0–36.0)
MCV: 96.2 fL (ref 78.0–100.0)
Platelets: 209 10*3/uL (ref 150–400)
RBC: 4.2 MIL/uL — ABNORMAL LOW (ref 4.22–5.81)
RDW: 13.1 % (ref 11.5–15.5)
WBC: 11.9 10*3/uL — ABNORMAL HIGH (ref 4.0–10.5)

## 2014-01-16 LAB — MAGNESIUM: MAGNESIUM: 2 mg/dL (ref 1.5–2.5)

## 2014-01-16 MED ORDER — SODIUM CHLORIDE 0.9 % IJ SOLN
10.0000 mL | INTRAMUSCULAR | Status: DC | PRN
  Administered 2014-01-16 – 2014-01-21 (×10): 10 mL

## 2014-01-16 MED ORDER — PHENOL 1.4 % MT LIQD
1.0000 | OROMUCOSAL | Status: DC | PRN
  Administered 2014-01-16: 1 via OROMUCOSAL
  Filled 2014-01-16 (×2): qty 177

## 2014-01-16 NOTE — Progress Notes (Signed)
1 Day Post-Op  Subjective: No pain.  Only received 2 doses of dilaudid overnight (18 hours).  Denies nausea.    Objective: Vital signs in last 24 hours: Temp:  [97.2 F (36.2 C)-98.3 F (36.8 C)] 97.8 F (36.6 C) (08/11 0809) Pulse Rate:  [48-81] 61 (08/11 1000) Resp:  [10-20] 12 (08/11 1000) BP: (93-137)/(57-75) 127/73 mmHg (08/11 1000) SpO2:  [94 %-100 %] 95 % (08/11 1000) Arterial Line BP: (85-162)/(53-83) 155/68 mmHg (08/11 1000)    Intake/Output from previous day: 08/10 0701 - 08/11 0700 In: 4971.7 [I.V.:4441.7; NG/GT:80; IV Piggyback:450] Out: 4362 [Urine:3120; Emesis/NG output:530; Drains:162; Blood:550] Intake/Output this shift: Total I/O In: 300 [I.V.:300] Out: 130 [Urine:130]  General appearance: alert and cooperative Resp: breathing comfortably GI: soft, non distended, approp tender.  JP serosang NGT bilious  Lab Results:   Recent Labs  01/15/14 1800 01/16/14 0450  WBC 9.0 11.9*  HGB 13.8 13.8  HCT 40.7 40.4  PLT 204 209   BMET  Recent Labs  01/15/14 1830 01/16/14 0450  NA  --  138  K  --  4.6  CL  --  105  CO2  --  24  GLUCOSE  --  127*  BUN  --  15  CREATININE 1.03 1.07  CALCIUM  --  8.0*   PT/INR  Recent Labs  01/16/14 0450  LABPROT 13.5  INR 1.03   ABG No results found for this basename: PHART, PCO2, PO2, HCO3,  in the last 72 hours  Studies/Results: Dg Chest Portable 1 View  01/15/2014   CLINICAL DATA:  53 year old male with right central venous catheter placement.  EXAM: PORTABLE CHEST - 1 VIEW  COMPARISON:  01/09/2014 and prior chest radiographs dating back to 04/02/2010.  FINDINGS: This is a low volume film with bibasilar atelectasis.  Cardiomediastinal silhouette is within normal limits.  There has been interval placement of a right IJ central venous catheter with tip overlying the lower SVC.  There is no evidence of pneumothorax.  An NG tube is present with tip overlying the proximal stomach.  A radiopaque line/tube is  identified over the lower left neck and coiling over the mid chest -correlate clinically and with external leads/lines.  IMPRESSION: Right IJ central venous catheter placement with tip overlying the lower SVC. No evidence of pneumothorax.  Radiopaque line/ tube overlying the lower left neck and mid chest -suspect external to the patient but correlate clinically.  Low volume film with mild bibasilar atelectasis.   Electronically Signed   By: Hassan Rowan M.D.   On: 01/15/2014 13:03    Anti-infectives: Anti-infectives   Start     Dose/Rate Route Frequency Ordered Stop   01/15/14 1800  cefOXitin (MEFOXIN) 1 g in dextrose 5 % 50 mL IVPB     1 g 100 mL/hr over 30 Minutes Intravenous Every 6 hours 01/15/14 1641 01/16/14 0654   01/15/14 0600  ceFAZolin (ANCEF) IVPB 2 g/50 mL premix     2 g 100 mL/hr over 30 Minutes Intravenous On call to O.R. 01/14/14 1409 01/15/14 0845      Assessment/Plan: s/p Procedure(s): LAPAROSCOPY DIAGNOSTIC (N/A) OPEN HEPATECTOMY (N/A) Continue foley due to epidural in place continue NGT due to significant bilious output. D/c art line Transfer If OK with epidural ok up there. HTN- atenolol  LOS: 1 day    [facility] 01/16/2014

## 2014-01-16 NOTE — Clinical Documentation Improvement (Signed)
In preparation for ICD-10, greater specificity is requested in regards to the adhesions taken down during the operative procedure.   Please clarify the following documentation found in the operative note: "The adhesions of the omentum to the liver were taken down..."    Condition is related to prior surgical procedure.  (please specify)   Condition is related to prior surgical procedure but is inherent, expected or integral.  (please specify)   Condition is unrelated to the prior surgical procedure.  (please specify)   Cannot clinically determine   Please Document the specific location that the adhesions were taken down from on the omentum and the liver. Were the adhesions attached to:     OMENTUM:  - the greater omentum  - the lesser omentum  - both the greater and lesser omentum     LIVER:  - the left liver lobe  - the right liver lobe  - both the right and left liver lobes   Thank You for your time with this,   Almon Register, RN Clinical Documentation Improvement Specialist (CDIS) [phone] / [phone]

## 2014-01-16 NOTE — Progress Notes (Signed)
Utilization Review Completed.Donne Anon T8/04/2014

## 2014-01-16 NOTE — Progress Notes (Signed)
Transferred to 6N15 via wheelchair. Portable monitor and oxygen on. Report given to Karna Christmas, RN

## 2014-01-16 NOTE — Progress Notes (Signed)
Confirmed with MD Cornett OK to give PO simvastatin with sip of water

## 2014-01-17 LAB — COMPREHENSIVE METABOLIC PANEL
ALBUMIN: 2.9 g/dL — AB (ref 3.5–5.2)
ALK PHOS: 52 U/L (ref 39–117)
ALT: 248 U/L — AB (ref 0–53)
AST: 154 U/L — ABNORMAL HIGH (ref 0–37)
Anion gap: 10 (ref 5–15)
BUN: 17 mg/dL (ref 6–23)
CALCIUM: 7.9 mg/dL — AB (ref 8.4–10.5)
CO2: 25 mEq/L (ref 19–32)
Chloride: 103 mEq/L (ref 96–112)
Creatinine, Ser: 1.07 mg/dL (ref 0.50–1.35)
GFR calc Af Amer: 88 mL/min — ABNORMAL LOW (ref >90)
GFR calc non Af Amer: 76 mL/min — ABNORMAL LOW (ref >90)
Glucose, Bld: 110 mg/dL — ABNORMAL HIGH (ref 70–99)
POTASSIUM: 4.5 meq/L (ref 3.7–5.3)
Sodium: 138 mEq/L (ref 137–147)
Total Bilirubin: 0.9 mg/dL (ref 0.3–1.2)
Total Protein: 6.1 g/dL (ref 6.0–8.3)

## 2014-01-17 LAB — CBC
HCT: 39.5 % (ref 39.0–52.0)
Hemoglobin: 13 g/dL (ref 13.0–17.0)
MCH: 32.2 pg (ref 26.0–34.0)
MCHC: 32.9 g/dL (ref 30.0–36.0)
MCV: 97.8 fL (ref 78.0–100.0)
Platelets: 200 10*3/uL (ref 150–400)
RBC: 4.04 MIL/uL — ABNORMAL LOW (ref 4.22–5.81)
RDW: 13.2 % (ref 11.5–15.5)
WBC: 10.8 10*3/uL — ABNORMAL HIGH (ref 4.0–10.5)

## 2014-01-17 NOTE — Addendum Note (Signed)
Addendum created 01/17/14 1141 by Laurie Panda, MD   Modules edited: PRL Based Order Sets

## 2014-01-17 NOTE — Progress Notes (Signed)
Patient ID: Bryan Fields, male   DOB: 23-Feb-1961, 53 y.o.   MRN: [id number] 2 Days Post-Op  Subjective: Sore after getting up 3 times yesterday.     Objective: Vital signs in last 24 hours: Temp:  [97.8 F (36.6 C)-99.3 F (37.4 C)] 99.3 F (37.4 C) (08/12 7829) Pulse Rate:  [61-93] 93 (08/12 0632) Resp:  [12-18] 18 (08/12 0632) BP: (121-138)/(56-87) 132/56 mmHg (08/12 0632) SpO2:  [94 %-100 %] 98 % (08/12 5621) Arterial Line BP: (141-155)/(65-68) 155/68 mmHg (08/11 1000) Weight:  [255 lb (115.667 kg)] 255 lb (115.667 kg) (08/11 1350)    Intake/Output from previous day: 08/11 0701 - 08/12 0700 In: 620 [I.V.:600; NG/GT:20] Out: 1465 [Urine:780; Emesis/NG output:650; Drains:35] Intake/Output this shift:    General appearance: alert and cooperative Resp: breathing comfortably GI: soft, non distended, approp tender.  JP serosang NGT bilious  Lab Results:   Recent Labs  01/16/14 0450 01/17/14 0526  WBC 11.9* 10.8*  HGB 13.8 13.0  HCT 40.4 39.5  PLT 209 200   BMET  Recent Labs  01/16/14 0450 01/17/14 0526  NA 138 138  K 4.6 4.5  CL 105 103  CO2 24 25  GLUCOSE 127* 110*  BUN 15 17  CREATININE 1.07 1.07  CALCIUM 8.0* 7.9*   PT/INR  Recent Labs  01/16/14 0450  LABPROT 13.5  INR 1.03   ABG No results found for this basename: PHART, PCO2, PO2, HCO3,  in the last 72 hours  Studies/Results: Dg Chest Portable 1 View  01/15/2014   CLINICAL DATA:  53 year old male with right central venous catheter placement.  EXAM: PORTABLE CHEST - 1 VIEW  COMPARISON:  01/09/2014 and prior chest radiographs dating back to 04/02/2010.  FINDINGS: This is a low volume film with bibasilar atelectasis.  Cardiomediastinal silhouette is within normal limits.  There has been interval placement of a right IJ central venous catheter with tip overlying the lower SVC.  There is no evidence of pneumothorax.  An NG tube is present with tip overlying the proximal stomach.  A radiopaque  line/tube is identified over the lower left neck and coiling over the mid chest -correlate clinically and with external leads/lines.  IMPRESSION: Right IJ central venous catheter placement with tip overlying the lower SVC. No evidence of pneumothorax.  Radiopaque line/ tube overlying the lower left neck and mid chest -suspect external to the patient but correlate clinically.  Low volume film with mild bibasilar atelectasis.   Electronically Signed   By: Hassan Rowan M.D.   On: 01/15/2014 13:03    Anti-infectives: Anti-infectives   Start     Dose/Rate Route Frequency Ordered Stop   01/15/14 1800  cefOXitin (MEFOXIN) 1 g in dextrose 5 % 50 mL IVPB     1 g 100 mL/hr over 30 Minutes Intravenous Every 6 hours 01/15/14 1641 01/16/14 0654   01/15/14 0600  ceFAZolin (ANCEF) IVPB 2 g/50 mL premix     2 g 100 mL/hr over 30 Minutes Intravenous On call to O.R. 01/14/14 1409 01/15/14 0845      Assessment/Plan: s/p Procedure(s): LAPAROSCOPY DIAGNOSTIC (N/A) OPEN HEPATECTOMY (N/A) Continue foley due to epidural in place D/c NGT HTN- atenolol, better today.   Ambulate, pulmonary toilet.     LOS: 2 days    [facility] 01/17/2014

## 2014-01-18 ENCOUNTER — Encounter (HOSPITAL_COMMUNITY): Payer: Self-pay | Admitting: General Surgery

## 2014-01-18 LAB — COMPREHENSIVE METABOLIC PANEL
ALK PHOS: 48 U/L (ref 39–117)
ALT: 152 U/L — ABNORMAL HIGH (ref 0–53)
AST: 84 U/L — AB (ref 0–37)
Albumin: 2.6 g/dL — ABNORMAL LOW (ref 3.5–5.2)
Anion gap: 9 (ref 5–15)
BILIRUBIN TOTAL: 1.2 mg/dL (ref 0.3–1.2)
BUN: 13 mg/dL (ref 6–23)
CHLORIDE: 99 meq/L (ref 96–112)
CO2: 26 meq/L (ref 19–32)
Calcium: 7.9 mg/dL — ABNORMAL LOW (ref 8.4–10.5)
Creatinine, Ser: 0.99 mg/dL (ref 0.50–1.35)
GLUCOSE: 96 mg/dL (ref 70–99)
POTASSIUM: 4.3 meq/L (ref 3.7–5.3)
SODIUM: 134 meq/L — AB (ref 137–147)
Total Protein: 5.8 g/dL — ABNORMAL LOW (ref 6.0–8.3)

## 2014-01-18 LAB — CBC
HCT: 37.6 % — ABNORMAL LOW (ref 39.0–52.0)
HEMOGLOBIN: 12.5 g/dL — AB (ref 13.0–17.0)
MCH: 32.9 pg (ref 26.0–34.0)
MCHC: 33.2 g/dL (ref 30.0–36.0)
MCV: 98.9 fL (ref 78.0–100.0)
Platelets: 199 10*3/uL (ref 150–400)
RBC: 3.8 MIL/uL — AB (ref 4.22–5.81)
RDW: 12.9 % (ref 11.5–15.5)
WBC: 8.1 10*3/uL (ref 4.0–10.5)

## 2014-01-18 NOTE — Addendum Note (Signed)
Addendum created 01/18/14 1547 by Laurie Panda, MD   Modules edited: Clinical Notes   Clinical Notes:  File: [id number]; File: [id number]; Pend: [id number]; Pend: [id number]

## 2014-01-18 NOTE — Progress Notes (Addendum)
Anesthesia Perioperative Pain Progress Note  [name] POD #1 s/p open hepatectomy for metastatic adenocarcinoma managed with GETA and a T6/7 epidural  Patient with dull ache in abdomen requiring intermittent breakthrough narcotics. Patient ambulating with assistance. Foley in place  Physical Exam: Temp:  36.6 Pulse Rate: 69  Resp: 16 BP: 133/86 SpO2: 99% [place]  A+O x3 CTAB RRR, no murmur Thoracic epidural with catheter site clean and without erythema or induration. Pump infusing. Dressing clean, dry and intact Normal motor/sensory exam LE bilaterally  A/P: [name] POD #1 s/p hepatectomy with pain adequately controlled with T6/7 epidural   Continue 0.2% Ropivacaine epidural infusion at 11ml/hr until return of bowel function and able to tolerate PO meds. PRN narcotics for breakthrough pain. Consider removal of foley catheter and evaluate with bladder scans for urinary retention in setting og thoracic epidural. ASA and Heparin sq okay in setting of epidural catheter. Please call Anesthesia if further questions or concerns.  Oleta Mouse, MD 01/16/2014 3:45 PM

## 2014-01-18 NOTE — Progress Notes (Signed)
Patient ID: Bryan Fields, male   DOB: Apr 08, 1961, 53 y.o.   MRN: [id number] 3 Days Post-Op  Subjective: Pain control good overall, still sore with getting out of bed.  No nausea or vomiting, but still belching.  No flatus.    Objective: Vital signs in last 24 hours: Temp:  [98.4 F (36.9 C)-99.3 F (37.4 C)] 98.5 F (36.9 C) (08/13 0505) Pulse Rate:  [72-93] 72 (08/13 0505) Resp:  [15-18] 15 (08/13 0505) BP: (102-132)/(56-83) 120/67 mmHg (08/13 0505) SpO2:  [94 %-99 %] 94 % (08/13 0505)    Intake/Output from previous day: 08/12 0701 - 08/13 0700 In: 1950 [P.O.:750; I.V.:1200] Out: 1585 [Urine:1525; Drains:60] Intake/Output this shift: Total I/O In: 750 [P.O.:150; I.V.:600] Out: 5093 [Urine:1225; Drains:30]  General appearance: alert and cooperative Resp: breathing comfortably GI: soft, non distended, approp tender.  JP serosang Wound looks good.  No erythema or drainage.    Lab Results:   Recent Labs  01/17/14 0526 01/18/14 0500  WBC 10.8* 8.1  HGB 13.0 12.5*  HCT 39.5 37.6*  PLT 200 199   BMET  Recent Labs  01/16/14 0450 01/17/14 0526  NA 138 138  K 4.6 4.5  CL 105 103  CO2 24 25  GLUCOSE 127* 110*  BUN 15 17  CREATININE 1.07 1.07  CALCIUM 8.0* 7.9*   PT/INR  Recent Labs  01/16/14 0450  LABPROT 13.5  INR 1.03   ABG No results found for this basename: PHART, PCO2, PO2, HCO3,  in the last 72 hours  Studies/Results: No results found.  Anti-infectives: Anti-infectives   Start     Dose/Rate Route Frequency Ordered Stop   01/15/14 1800  cefOXitin (MEFOXIN) 1 g in dextrose 5 % 50 mL IVPB     1 g 100 mL/hr over 30 Minutes Intravenous Every 6 hours 01/15/14 1641 01/16/14 0654   01/15/14 0600  ceFAZolin (ANCEF) IVPB 2 g/50 mL premix     2 g 100 mL/hr over 30 Minutes Intravenous On call to O.R. 01/14/14 1409 01/15/14 0845      Assessment/Plan: s/p Procedure(s): LAPAROSCOPY DIAGNOSTIC (N/A) OPEN HEPATECTOMY (N/A) Try d/c foley.  Post void  residuals.   No advance on diet yet.   HTN- atenolol, better today.   Ambulate, pulmonary toilet.     LOS: 3 days    [facility] 01/18/2014

## 2014-01-19 LAB — COMPREHENSIVE METABOLIC PANEL
ALT: 109 U/L — AB (ref 0–53)
AST: 52 U/L — AB (ref 0–37)
Albumin: 2.7 g/dL — ABNORMAL LOW (ref 3.5–5.2)
Alkaline Phosphatase: 55 U/L (ref 39–117)
Anion gap: 10 (ref 5–15)
BUN: 11 mg/dL (ref 6–23)
CALCIUM: 8.1 mg/dL — AB (ref 8.4–10.5)
CO2: 26 mEq/L (ref 19–32)
Chloride: 99 mEq/L (ref 96–112)
Creatinine, Ser: 0.93 mg/dL (ref 0.50–1.35)
GFR calc non Af Amer: >90 mL/min (ref >90)
Glucose, Bld: 99 mg/dL (ref 70–99)
Potassium: 4 mEq/L (ref 3.7–5.3)
SODIUM: 135 meq/L — AB (ref 137–147)
Total Bilirubin: 1 mg/dL (ref 0.3–1.2)
Total Protein: 6 g/dL (ref 6.0–8.3)

## 2014-01-19 LAB — CBC
HCT: 35.3 % — ABNORMAL LOW (ref 39.0–52.0)
Hemoglobin: 12.2 g/dL — ABNORMAL LOW (ref 13.0–17.0)
MCH: 33 pg (ref 26.0–34.0)
MCHC: 34.6 g/dL (ref 30.0–36.0)
MCV: 95.4 fL (ref 78.0–100.0)
PLATELETS: 228 10*3/uL (ref 150–400)
RBC: 3.7 MIL/uL — ABNORMAL LOW (ref 4.22–5.81)
RDW: 12.5 % (ref 11.5–15.5)
WBC: 7.1 10*3/uL (ref 4.0–10.5)

## 2014-01-19 MED ORDER — OXYCODONE-ACETAMINOPHEN 5-325 MG PO TABS
1.0000 | ORAL_TABLET | ORAL | Status: DC | PRN
  Administered 2014-01-19 – 2014-01-22 (×12): 2 via ORAL
  Filled 2014-01-19 (×13): qty 2

## 2014-01-19 NOTE — Addendum Note (Signed)
Addendum created 01/19/14 1505 by Julian Reil, CRNA   Modules edited: Anesthesia LDA

## 2014-01-19 NOTE — Progress Notes (Signed)
Patient ID: Bryan Fields, male   DOB: 1961/04/04, 53 y.o.   MRN: [id number] 4 Days Post-Op  Subjective: Continues to improve.  Minimal belching.  Passing flatus.  Urinated spontaneously without foley.  Objective: Vital signs in last 24 hours: Temp:  [98.1 F (36.7 C)-98.7 F (37.1 C)] 98.7 F (37.1 C) (08/14 0541) Pulse Rate:  [66-75] 75 (08/14 0541) Resp:  [16-18] 17 (08/14 0541) BP: (115-120)/(68-74) 115/74 mmHg (08/14 0541) SpO2:  [96 %-98 %] 96 % (08/14 0541) Last BM Date:  (PTA)  Intake/Output from previous day: 08/13 0701 - 08/14 0700 In: 2026.3 [P.O.:195; I.V.:1831.3] Out: 1650 [Urine:1550; Drains:100] Intake/Output this shift:    General appearance: alert and cooperative Resp: breathing comfortably GI: soft, non distended, approp tender.  JP serosang Wound looks good.  No erythema or drainage.    Lab Results:   Recent Labs  01/18/14 0500 01/19/14 0455  WBC 8.1 7.1  HGB 12.5* 12.2*  HCT 37.6* 35.3*  PLT 199 228   BMET  Recent Labs  01/18/14 0500 01/19/14 0455  NA 134* 135*  K 4.3 4.0  CL 99 99  CO2 26 26  GLUCOSE 96 99  BUN 13 11  CREATININE 0.99 0.93  CALCIUM 7.9* 8.1*   PT/INR No results found for this basename: LABPROT, INR,  in the last 72 hours ABG No results found for this basename: PHART, PCO2, PO2, HCO3,  in the last 72 hours  Studies/Results: No results found.  Anti-infectives: Anti-infectives   Start     Dose/Rate Route Frequency Ordered Stop   01/15/14 1800  cefOXitin (MEFOXIN) 1 g in dextrose 5 % 50 mL IVPB     1 g 100 mL/hr over 30 Minutes Intravenous Every 6 hours 01/15/14 1641 01/16/14 0654   01/15/14 0600  ceFAZolin (ANCEF) IVPB 2 g/50 mL premix     2 g 100 mL/hr over 30 Minutes Intravenous On call to O.R. 01/14/14 1409 01/15/14 0845      Assessment/Plan: s/p Procedure(s): LAPAROSCOPY DIAGNOSTIC (N/A) OPEN HEPATECTOMY (N/A) Advance diet.   D/c oxygen and pulse ox. Try oral narcotics today.   HTN- atenolol,  better today.   Ambulate, pulmonary toilet.     LOS: 4 days    [facility] 01/19/2014

## 2014-01-19 NOTE — Progress Notes (Signed)
UR completed.  Sanuel Ladnier, RN BSN [facility] CCM Trauma/Neuro ICU Case Manager [phone]  

## 2014-01-20 LAB — COMPREHENSIVE METABOLIC PANEL
ALT: 83 U/L — AB (ref 0–53)
AST: 37 U/L (ref 0–37)
Albumin: 2.6 g/dL — ABNORMAL LOW (ref 3.5–5.2)
Alkaline Phosphatase: 67 U/L (ref 39–117)
Anion gap: 10 (ref 5–15)
BILIRUBIN TOTAL: 0.9 mg/dL (ref 0.3–1.2)
BUN: 8 mg/dL (ref 6–23)
CO2: 26 meq/L (ref 19–32)
Calcium: 8.7 mg/dL (ref 8.4–10.5)
Chloride: 100 mEq/L (ref 96–112)
Creatinine, Ser: 0.92 mg/dL (ref 0.50–1.35)
Glucose, Bld: 114 mg/dL — ABNORMAL HIGH (ref 70–99)
Potassium: 4.5 mEq/L (ref 3.7–5.3)
SODIUM: 136 meq/L — AB (ref 137–147)
Total Protein: 6 g/dL (ref 6.0–8.3)

## 2014-01-20 LAB — CBC
HCT: 34.9 % — ABNORMAL LOW (ref 39.0–52.0)
Hemoglobin: 12 g/dL — ABNORMAL LOW (ref 13.0–17.0)
MCH: 32.6 pg (ref 26.0–34.0)
MCHC: 34.4 g/dL (ref 30.0–36.0)
MCV: 94.8 fL (ref 78.0–100.0)
PLATELETS: 253 10*3/uL (ref 150–400)
RBC: 3.68 MIL/uL — AB (ref 4.22–5.81)
RDW: 12.7 % (ref 11.5–15.5)
WBC: 5.9 10*3/uL (ref 4.0–10.5)

## 2014-01-20 NOTE — Progress Notes (Signed)
General Surgery Note  LOS: 5 days  POD -  5 Days Post-Op  Assessment/Plan: 1.  Diagnostic laparoscopy, left hepatic/caudate lobectomy, portal lymph node dissection - 01/15/2014 - F. Byerly  For metastatic adenocarcinoma in caudate lobe., 0/2 nodes  To advance to regular diet and try to get him to walk more.  2.  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   T3, N1 (2/15 nodes)   Right hemicolectomy - 04/03/2010 - D. Lailoni Baquera 3.  HTN 4.  DVT prophylaxis - SQ Heparin  Active Problems:   Metastatic colon cancer to liver in caudate lobe  Subjective:  On full liquids.  Sore but doing okay.  Needs to ambulate more.  He did very well with the epidural.  Now working through pain.  Wife in room.  Patient known to me from my original resection. Objective:   Filed Vitals:   01/20/14 0533  BP: 130/79  Pulse: 65  Temp: 97.9 F (36.6 C)  Resp: 16     Intake/Output from previous day:  08/14 0701 - 08/15 0700 In: 1093 [P.O.:418; I.V.:675] Out: 635 [Urine:350; Drains:285]  Intake/Output this shift:      Physical Exam:   General: WN WM who is alert and oriented.    HEENT: Normal. Pupils equal. .   Lungs: Clear.   Abdomen: Sore. BS present   Wound: Clean.  Drain i LUQ - 285 cc recorded last 24 hours.   Lab Results:    Recent Labs  01/19/14 0455 01/20/14 0415  WBC 7.1 5.9  HGB 12.2* 12.0*  HCT 35.3* 34.9*  PLT 228 253    BMET   Recent Labs  01/19/14 0455 01/20/14 0415  NA 135* 136*  K 4.0 4.5  CL 99 100  CO2 26 26  GLUCOSE 99 114*  BUN 11 8  CREATININE 0.93 0.92  CALCIUM 8.1* 8.7    PT/INR  No results found for this basename: LABPROT, INR,  in the last 72 hours  ABG  No results found for this basename: PHART, PCO2, PO2, HCO3,  in the last 72 hours   Studies/Results:  No results found.   Anti-infectives:   Anti-infectives   Start     Dose/Rate Route Frequency Ordered Stop   01/15/14 1800  cefOXitin (MEFOXIN) 1 g in dextrose 5 % 50 mL IVPB     1 g 100  mL/hr over 30 Minutes Intravenous Every 6 hours 01/15/14 1641 01/16/14 0654   01/15/14 0600  ceFAZolin (ANCEF) IVPB 2 g/50 mL premix     2 g 100 mL/hr over 30 Minutes Intravenous On call to O.R. 01/14/14 1409 01/15/14 0845      Alphonsa Overall, MD, FACS Pager: [facility] Office: [phone] 01/20/2014

## 2014-01-21 LAB — COMPREHENSIVE METABOLIC PANEL
ALBUMIN: 2.7 g/dL — AB (ref 3.5–5.2)
ALT: 62 U/L — AB (ref 0–53)
AST: 28 U/L (ref 0–37)
Alkaline Phosphatase: 63 U/L (ref 39–117)
Anion gap: 10 (ref 5–15)
BUN: 8 mg/dL (ref 6–23)
CALCIUM: 8.5 mg/dL (ref 8.4–10.5)
CO2: 25 mEq/L (ref 19–32)
CREATININE: 0.98 mg/dL (ref 0.50–1.35)
Chloride: 98 mEq/L (ref 96–112)
GFR calc Af Amer: >90 mL/min (ref >90)
GFR calc non Af Amer: >90 mL/min (ref >90)
Glucose, Bld: 102 mg/dL — ABNORMAL HIGH (ref 70–99)
Potassium: 4.3 mEq/L (ref 3.7–5.3)
SODIUM: 133 meq/L — AB (ref 137–147)
TOTAL PROTEIN: 6 g/dL (ref 6.0–8.3)
Total Bilirubin: 0.6 mg/dL (ref 0.3–1.2)

## 2014-01-21 LAB — CBC
HCT: 34.1 % — ABNORMAL LOW (ref 39.0–52.0)
Hemoglobin: 11.7 g/dL — ABNORMAL LOW (ref 13.0–17.0)
MCH: 32.6 pg (ref 26.0–34.0)
MCHC: 34.3 g/dL (ref 30.0–36.0)
MCV: 95 fL (ref 78.0–100.0)
PLATELETS: 250 10*3/uL (ref 150–400)
RBC: 3.59 MIL/uL — ABNORMAL LOW (ref 4.22–5.81)
RDW: 12.8 % (ref 11.5–15.5)
WBC: 4.6 10*3/uL (ref 4.0–10.5)

## 2014-01-21 MED ORDER — DOCUSATE SODIUM 100 MG PO CAPS
100.0000 mg | ORAL_CAPSULE | Freq: Once | ORAL | Status: AC
  Administered 2014-01-21: 100 mg via ORAL
  Filled 2014-01-21: qty 1

## 2014-01-21 NOTE — Progress Notes (Signed)
General Surgery Note  LOS: 6 days  POD -  6 Days Post-Op  Assessment/Plan: 1.  Diagnostic laparoscopy, left hepatic/caudate lobectomy, portal lymph node dissection - 01/15/2014 - F. Byerly  For metastatic adenocarcinoma in caudate lobe., 0/2 nodes (copy to patient)  On reg diet.  No BM.  Still working with pain control.  2.  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   T3, N1 (2/15 nodes)   Right hemicolectomy - 04/03/2010 - D. Lorely Bubb 3.  HTN 4.  DVT prophylaxis - SQ Heparin  Active Problems:   Metastatic colon cancer to liver in caudate lobe  Subjective:  On reg diet.  No BM.  Still with a fair amount of pain requiring both dilaudid IV and percocet PO.  Wife in room.  Patient known to me from my original resection. Objective:   Filed Vitals:   01/21/14 0603  BP: 113/70  Pulse: 60  Temp: 98 F (36.7 C)  Resp: 17     Intake/Output from previous day:  08/15 0701 - 08/16 0700 In: 1360 [P.O.:600; I.V.:760] Out: 300 [Drains:300]  Intake/Output this shift:      Physical Exam:   General: WN WM who is alert and oriented.    HEENT: Normal. Pupils equal. .   Lungs: Clear.   Abdomen: Sore. Rare BS.   Wound: Clean.  Drain in LUQ - 300 cc recorded last 24 hours.   Lab Results:     Recent Labs  01/20/14 0415 01/21/14 0500  WBC 5.9 4.6  HGB 12.0* 11.7*  HCT 34.9* 34.1*  PLT 253 250    BMET    Recent Labs  01/20/14 0415 01/21/14 0500  NA 136* 133*  K 4.5 4.3  CL 100 98  CO2 26 25  GLUCOSE 114* 102*  BUN 8 8  CREATININE 0.92 0.98  CALCIUM 8.7 8.5    PT/INR  No results found for this basename: LABPROT, INR,  in the last 72 hours  ABG  No results found for this basename: PHART, PCO2, PO2, HCO3,  in the last 72 hours   Studies/Results:  No results found.   Anti-infectives:   Anti-infectives   Start     Dose/Rate Route Frequency Ordered Stop   01/15/14 1800  cefOXitin (MEFOXIN) 1 g in dextrose 5 % 50 mL IVPB     1 g 100 mL/hr over 30 Minutes  Intravenous Every 6 hours 01/15/14 1641 01/16/14 0654   01/15/14 0600  ceFAZolin (ANCEF) IVPB 2 g/50 mL premix     2 g 100 mL/hr over 30 Minutes Intravenous On call to O.R. 01/14/14 1409 01/15/14 0845      Alphonsa Overall, MD, FACS Pager: [facility] Office: [phone] 01/21/2014

## 2014-01-22 LAB — COMPREHENSIVE METABOLIC PANEL
ALT: 70 U/L — AB (ref 0–53)
AST: 40 U/L — ABNORMAL HIGH (ref 0–37)
Albumin: 2.6 g/dL — ABNORMAL LOW (ref 3.5–5.2)
Alkaline Phosphatase: 73 U/L (ref 39–117)
Anion gap: 9 (ref 5–15)
BILIRUBIN TOTAL: 0.5 mg/dL (ref 0.3–1.2)
BUN: 9 mg/dL (ref 6–23)
CO2: 27 meq/L (ref 19–32)
CREATININE: 1.06 mg/dL (ref 0.50–1.35)
Calcium: 8.4 mg/dL (ref 8.4–10.5)
Chloride: 98 mEq/L (ref 96–112)
GFR calc Af Amer: 89 mL/min — ABNORMAL LOW (ref >90)
GFR, EST NON AFRICAN AMERICAN: 77 mL/min — AB (ref >90)
Glucose, Bld: 125 mg/dL — ABNORMAL HIGH (ref 70–99)
Potassium: 4.2 mEq/L (ref 3.7–5.3)
Sodium: 134 mEq/L — ABNORMAL LOW (ref 137–147)
Total Protein: 6 g/dL (ref 6.0–8.3)

## 2014-01-22 LAB — CBC
HCT: 33.9 % — ABNORMAL LOW (ref 39.0–52.0)
HEMOGLOBIN: 11.5 g/dL — AB (ref 13.0–17.0)
MCH: 32.6 pg (ref 26.0–34.0)
MCHC: 33.9 g/dL (ref 30.0–36.0)
MCV: 96 fL (ref 78.0–100.0)
PLATELETS: 261 10*3/uL (ref 150–400)
RBC: 3.53 MIL/uL — AB (ref 4.22–5.81)
RDW: 13 % (ref 11.5–15.5)
WBC: 4.8 10*3/uL (ref 4.0–10.5)

## 2014-01-22 MED ORDER — POLYETHYLENE GLYCOL 3350 17 G PO PACK
17.0000 g | PACK | Freq: Every day | ORAL | Status: DC | PRN
Start: 2014-01-22 — End: 2014-01-22

## 2014-01-22 MED ORDER — BISACODYL 10 MG RE SUPP
10.0000 mg | Freq: Once | RECTAL | Status: AC
  Administered 2014-01-22: 10 mg via RECTAL
  Filled 2014-01-22: qty 1

## 2014-01-22 MED ORDER — SENNOSIDES-DOCUSATE SODIUM 8.6-50 MG PO TABS
1.0000 | ORAL_TABLET | Freq: Two times a day (BID) | ORAL | Status: DC
  Administered 2014-01-22: 1 via ORAL
  Filled 2014-01-22: qty 1

## 2014-01-22 MED ORDER — SENNOSIDES-DOCUSATE SODIUM 8.6-50 MG PO TABS: 1.0000 | ORAL_TABLET | Freq: Two times a day (BID) | ORAL | Status: DC

## 2014-01-22 MED ORDER — POLYETHYLENE GLYCOL 3350 17 G PO PACK: 17.0000 g | PACK | Freq: Every day | ORAL | Status: DC | PRN

## 2014-01-22 MED ORDER — OXYCODONE-ACETAMINOPHEN 5-325 MG PO TABS: 1.0000 | ORAL_TABLET | ORAL | Status: DC | PRN

## 2014-01-22 NOTE — Progress Notes (Signed)
Patient discharge to home.  Discharge instruction given to patient.  No question verbalized.

## 2014-01-22 NOTE — Care Management Note (Signed)
  Page 1 of 1   01/22/2014     1:54:38 PM CARE MANAGEMENT NOTE 01/22/2014  Patient:  ADARSH, MUNDORF   Account Number:  [id number]  Date Initiated:  01/22/2014  Documentation initiated by:  Magdalen Spatz  Subjective/Objective Assessment:     Action/Plan:   Anticipated DC Date:  01/22/2014   Anticipated DC Plan:  [place]         Choice offered to / List presented to:  C-1 Patient        [place] arranged  HH-1 RN      [place].   Status of service:  Completed, signed off Medicare Important Message given?   (If response is "NO", the following Medicare IM given date fields will be blank) Date Medicare IM given:   Medicare IM given by:   Date Additional Medicare IM given:   Additional Medicare IM given by:    Discharge Disposition:  [place]  Per UR Regulation:  Reviewed for med. necessity/level of care/duration of stay  If discussed at [place] of Stay Meetings, dates discussed:    Comments:

## 2014-01-22 NOTE — Discharge Summary (Signed)
Physician Discharge Summary  Patient ID: Bryan Fields MRN: [id number] DOB/AGE: Aug 29, 1960 53 y.o.  Admit date: 01/15/2014 Discharge date: 01/22/2014  Admission Diagnoses: Patient Active Problem List   Diagnosis Date Noted  . 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 Obesity 03/24/2013  . Healthcare maintenance 03/14/2011  . Rectal irritation 03/14/2011  . ADENOCARCINOMA, COLON, STAGE III, right colectomy 04/03/2010. 05/10/2010  . HYPERGLYCEMIA 03/04/2009  . MICROSCOPIC HEMATURIA 12/13/2008  . HEMORRHOIDS, INTERNAL THROMBOSED 02/25/2007  . IMPOTENCE, ORGANIC ORIGIN 02/25/2007  . HYPERCHOLESTEROLEMIA 02/16/2007  . HYPERTENSION 02/16/2007    Discharge Diagnoses:  Same  Discharged Condition: stable  Hospital Course:  Pt was admitted to the ICU following his left hepatectomy.  He did well overnight and was very stable.  His epidural provided good pain control, and he had good urine output.  He was transferred to the floor with the epidural.  He was able to ambulate with assistance.  He did require some prn boluses of IV narcotics in addition to the epidural.  He was able to void with foley removal on POD 3.  His foley was left in an additional day due to the epidural.  He was started on clears, but had some belching.  Once he started passing some flatus, he was advanced to fulls, and then regular diet.  His epidural was removed, and he did better on percocet.  He continued to work on ambulating, and got to where he could get up with minimal to no assistance.  His drain was non bilious, and it was removed prior to discharge.  His staples were removed, and he had a small area of skin separation where there was some hematoma.  This was packed.  He was discharged to home in stable condition.    Consults: anesthesia  Significant Diagnostic Studies: labs: see epic, HCT stable prior to d/c.    Treatments: surgery: left hepatectomy  Discharge Exam: Blood pressure  114/80, pulse 66, temperature 98.2 F (36.8 C), temperature source Oral, resp. rate 16, height 5\' 11"  (1.803 m), weight 255 lb (115.667 kg), SpO2 96.00%. General appearance: alert, cooperative and no distress Resp: breathing comfortably GI: soft, approp tender at incision.  no erythema or wound drainage.    Disposition: 01-Home or Self Care  Discharge Instructions   Call MD for:  difficulty breathing, headache or visual disturbances    Complete by:  As directed      Call MD for:  persistant dizziness or light-headedness    Complete by:  As directed      Call MD for:  persistant nausea and vomiting    Complete by:  As directed      Call MD for:  redness, tenderness, or signs of infection (pain, swelling, redness, odor or green/yellow discharge around incision site)    Complete by:  As directed      Call MD for:  severe uncontrolled pain    Complete by:  As directed      Call MD for:  temperature >100.4    Complete by:  As directed      Change dressing (specify)    Complete by:  As directed   Dry dressing as needed to drain site while draining.  Once dry, can leave open to air.     Diet - low sodium heart healthy    Complete by:  As directed      Increase activity slowly    Complete by:  As directed  Medication List         atenolol 50 MG tablet  Commonly known as:  TENORMIN  Take 50 mg by mouth every morning.     OMEGA 3 PO  Take 1,400 mg by mouth 2 (two) times daily.     oxyCODONE-acetaminophen 5-325 MG per tablet  Commonly known as:  PERCOCET/ROXICET  Take 1-2 tablets by mouth every 4 (four) hours as needed for moderate pain.     polyethylene glycol packet  Commonly known as:  MIRALAX / GLYCOLAX  Take 17 g by mouth daily as needed (constipation).     senna-docusate 8.6-50 MG per tablet  Commonly known as:  Senokot-S  Take 1 tablet by mouth 2 (two) times daily.     sildenafil 100 MG tablet  Commonly known as:  VIAGRA  Take 100 mg by mouth daily as  needed for erectile dysfunction.     simvastatin 40 MG tablet  Commonly known as:  ZOCOR  Take 40 mg by mouth at bedtime.     Vitamin B-12 2500 MCG Subl  Place 1 tablet under the tongue daily.           Follow-up Information   Follow up with [facility], MD In 2 weeks.   Specialty:  General Surgery   Contact information:   [address] [place] [address] [phone]       Signed: Stark Klein 01/22/2014, 8:46 AM

## 2014-01-23 NOTE — Addendum Note (Signed)
Addendum created 01/23/14 1836 by Laurie Panda, MD   Modules edited: Anesthesia Blocks and Procedures, Clinical Notes   Clinical Notes:  File: [id number]

## 2014-01-23 NOTE — Anesthesia Procedure Notes (Signed)
Epidural Patient location during procedure: OR Start time: 01/15/2014 7:33 AM End time: 01/15/2014 7:44 AM  Staffing Anesthesiologist: Philmore Lepore, CHRIS Performed by: anesthesiologist   Preanesthetic Checklist Completed: patient identified, surgical consent, pre-op evaluation, timeout performed, IV checked, risks and benefits discussed and monitors and equipment checked  Epidural Patient position: sitting Prep: site prepped and draped and DuraPrep Patient monitoring: heart rate, cardiac monitor, continuous pulse ox and blood pressure Approach: midline Location: thoracic (1-12) Injection technique: LOR saline  Needle:  Needle type: Tuohy  Needle gauge: 17 G Needle length: 9 cm Needle insertion depth: 8 cm Catheter type: closed end flexible Catheter size: 19 Gauge Catheter at skin depth: 15 cm Test dose: 1.5% lidocaine with Epi 1:200 K  Assessment Sensory level: T6-T10/11. Events: blood not aspirated, injection not painful, no injection resistance, negative IV test and no paresthesia  Additional Notes H+P and labs checked, risks and benefits discussed with the patient, consent obtained, procedure tolerated well and without complications.  Reason for block:procedure for pain

## 2014-01-29 ENCOUNTER — Encounter (INDEPENDENT_AMBULATORY_CARE_PROVIDER_SITE_OTHER): Payer: 59 | Admitting: General Surgery

## 2014-02-05 ENCOUNTER — Ambulatory Visit (HOSPITAL_BASED_OUTPATIENT_CLINIC_OR_DEPARTMENT_OTHER): Payer: 59 | Admitting: Oncology

## 2014-02-05 VITALS — BP 132/73 | HR 61 | Temp 98.6°F | Resp 18 | Ht 71.0 in | Wt 242.1 lb

## 2014-02-05 DIAGNOSIS — K769 Liver disease, unspecified: Secondary | ICD-10-CM

## 2014-02-05 DIAGNOSIS — C189 Malignant neoplasm of colon, unspecified: Secondary | ICD-10-CM

## 2014-02-05 DIAGNOSIS — Z85038 Personal history of other malignant neoplasm of large intestine: Secondary | ICD-10-CM

## 2014-02-05 NOTE — Progress Notes (Signed)
[place] OFFICE PROGRESS NOTE   Diagnosis: Colon cancer  INTERVAL HISTORY:   Bryan Fields underwent a diagnostic laparoscopy and left hepatectomy on 01/15/2014. He continues to recover from surgery. In the lateral aspect of the abdominal wound remains partially open. He is packing the wound daily. He is scheduled to see Dr. Barry Fields tomorrow.  The pathology ([id number]) 2 negative portal lymph nodes. The caudate lobe resection confirmed metastatic adenocarcinoma with mucinous features the surgical margins are negative. The left liver resection had significant hemosiderin deposition.  Objective:  Vital signs in last 24 hours:  Blood pressure 132/73, pulse 61, temperature 98.6 F (37 C), temperature source Oral, resp. rate 18, height _0  (1.803 m), weight 242 lb 1.6 oz (109.816 kg), SpO2 100.00%.    HEENT: Neck without mass Lymphatics: No cervical, supraclavicular, or axillary nodes Resp: Lungs clear bilaterally Cardio: Regular rate and rhythm GI: Healing right upper abdomen incision. The lateral aspect of the incision has a superficial opening. Steri-Strips remain in place. Vascular: No leg edema   Lab Results:  Lab Results  Component Value Date   WBC 4.8 01/22/2014   HGB 11.5* 01/22/2014   HCT 33.9* 01/22/2014   MCV 96.0 01/22/2014   PLT 261 01/22/2014   NEUTROABS 3.0 01/09/2014    Lab Results  Component Value Date   CEA <0.5 10/23/2013    Medications: I have reviewed the patient's current medications.  Assessment/Plan: 1. 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 He began treatment with FOLFOX in December 2011. He completed cycle #12 on 10/27/2010. Oxaliplatin was held with cycles number 7 through 10 and resumed with cycle #11. 2. History of neutropenia secondary to chemotherapy. 3. History of thrombocytopenia secondary chemotherapy. 4. He did  not undergo a preoperative colonoscopy. He underwent a colonoscopy on 12/19/2010 with findings of a 1 cm polyp at 90 cm from the anal verge, minimal polyp at 30 cm from the anal verge and minimal diverticulosis. Colonoscopy 03/18/2012-negative. 5. 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 He began study drug on 05/13/2010. 6. Status post Port-A-Cath placement. The Port-A-Cath has been removed. 7. 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 9. Low attenuation lesion in the caudate lobe of the liver on the CT 04/11/2012-? Cyst. MRI liver on 04/20/2012 favored the caudate lobe liver lesion to represent a cyst or complex cyst. CT abdomen/pelvis 04/17/2013 with continued enlargement of hypovascular lesion in the caudate lobe of the liver. PET scan 10/27/2011 confirmed a hypermetabolic caudate lobe liver lesion, tiny indeterminate lung nodules, and a 9 mm level II left neck node  CT biopsy of the liver lesion 11/07/2013 confirmed adenocarcinoma Left liver and caudate resection 01/15/2014-pathology confirmed metastatic colon cancer, and negative surgical margins 10. Iron deposition within the liver noted on MRI 04/20/2012. The ferritin level was normal on 04/17/2013. Increased iron deposition noted on the left liver resection 01/15/2014 11. Pain/bleeding at the anus reported when here 04/24/2013. Status post evaluation by Dr. Lucia Fields with findings of an anal fissure. 12. Right face cystic lesion   Disposition:  Bryan Fields is recovering from the left liver resection. He was confirmed to have metastatic colon cancer involving the caudate lobe lesion. The resection margins are negative. He was treated with FOLFOX in the adjuvant setting in 2011. I explained  there is no clear role for additional systemic therapy in this setting. I will  present his case at the GI tumor conference and discussed options with my colleagues. We will contact him if there is a clear recommendation for chemotherapy.  He will return for an office visit and CEA on 04/26/2014.  We will discuss the liver iron deposition at the GI tumor conference.  Betsy Coder, MD  02/05/2014  1:01 PM

## 2014-02-06 ENCOUNTER — Encounter (INDEPENDENT_AMBULATORY_CARE_PROVIDER_SITE_OTHER): Payer: 59 | Admitting: General Surgery

## 2014-03-05 ENCOUNTER — Encounter (INDEPENDENT_AMBULATORY_CARE_PROVIDER_SITE_OTHER): Payer: 59 | Admitting: General Surgery

## 2014-03-17 ENCOUNTER — Other Ambulatory Visit: Payer: Self-pay | Admitting: Family Medicine

## 2014-03-17 ENCOUNTER — Encounter: Payer: Self-pay | Admitting: Family Medicine

## 2014-03-17 DIAGNOSIS — Z125 Encounter for screening for malignant neoplasm of prostate: Secondary | ICD-10-CM

## 2014-03-17 DIAGNOSIS — E7800 Pure hypercholesterolemia, unspecified: Secondary | ICD-10-CM

## 2014-03-17 DIAGNOSIS — I10 Essential (primary) hypertension: Secondary | ICD-10-CM

## 2014-03-19 ENCOUNTER — Other Ambulatory Visit (INDEPENDENT_AMBULATORY_CARE_PROVIDER_SITE_OTHER): Payer: 59

## 2014-03-19 DIAGNOSIS — I10 Essential (primary) hypertension: Secondary | ICD-10-CM

## 2014-03-19 DIAGNOSIS — Z125 Encounter for screening for malignant neoplasm of prostate: Secondary | ICD-10-CM

## 2014-03-19 DIAGNOSIS — E7800 Pure hypercholesterolemia, unspecified: Secondary | ICD-10-CM

## 2014-03-19 LAB — PSA: PSA: 0.6 ng/mL (ref 0.10–4.00)

## 2014-03-19 LAB — COMPREHENSIVE METABOLIC PANEL
ALT: 39 U/L (ref 0–53)
AST: 29 U/L (ref 0–37)
Albumin: 3.7 g/dL (ref 3.5–5.2)
Alkaline Phosphatase: 62 U/L (ref 39–117)
BUN: 19 mg/dL (ref 6–23)
CO2: 27 mEq/L (ref 19–32)
Calcium: 9.3 mg/dL (ref 8.4–10.5)
Chloride: 101 mEq/L (ref 96–112)
Creatinine, Ser: 1.1 mg/dL (ref 0.4–1.5)
GFR: 70.75 mL/min (ref >60.00)
Glucose, Bld: 95 mg/dL (ref 70–99)
Potassium: 4.6 mEq/L (ref 3.5–5.1)
SODIUM: 137 meq/L (ref 135–145)
TOTAL PROTEIN: 7.7 g/dL (ref 6.0–8.3)
Total Bilirubin: 0.8 mg/dL (ref 0.2–1.2)

## 2014-03-19 LAB — LIPID PANEL
Cholesterol: 126 mg/dL (ref 0–200)
HDL: 31.6 mg/dL — ABNORMAL LOW (ref >39.00)
LDL Cholesterol: 67 mg/dL (ref 0–99)
NonHDL: 94.4
Total CHOL/HDL Ratio: 4
Triglycerides: 139 mg/dL (ref 0.0–149.0)
VLDL: 27.8 mg/dL (ref 0.0–40.0)

## 2014-04-02 ENCOUNTER — Ambulatory Visit (INDEPENDENT_AMBULATORY_CARE_PROVIDER_SITE_OTHER): Payer: 59 | Admitting: Family Medicine

## 2014-04-02 ENCOUNTER — Encounter: Payer: Self-pay | Admitting: Family Medicine

## 2014-04-02 VITALS — BP 124/68 | HR 72 | Temp 98.5°F | Ht 71.0 in | Wt 248.0 lb

## 2014-04-02 DIAGNOSIS — I10 Essential (primary) hypertension: Secondary | ICD-10-CM

## 2014-04-02 DIAGNOSIS — Z0000 Encounter for general adult medical examination without abnormal findings: Secondary | ICD-10-CM

## 2014-04-02 DIAGNOSIS — E7800 Pure hypercholesterolemia, unspecified: Secondary | ICD-10-CM

## 2014-04-02 DIAGNOSIS — R739 Hyperglycemia, unspecified: Secondary | ICD-10-CM

## 2014-04-02 DIAGNOSIS — C189 Malignant neoplasm of colon, unspecified: Secondary | ICD-10-CM

## 2014-04-02 DIAGNOSIS — E669 Obesity, unspecified: Secondary | ICD-10-CM

## 2014-04-02 DIAGNOSIS — C787 Secondary malignant neoplasm of liver and intrahepatic bile duct: Secondary | ICD-10-CM

## 2014-04-02 MED ORDER — ATENOLOL 50 MG PO TABS: 50.0000 mg | ORAL_TABLET | Freq: Every morning | ORAL | Status: DC

## 2014-04-02 MED ORDER — SIMVASTATIN 40 MG PO TABS: 40.0000 mg | ORAL_TABLET | Freq: Every day | ORAL | Status: DC

## 2014-04-02 MED ORDER — OMEPRAZOLE 40 MG PO CPDR: 40.0000 mg | DELAYED_RELEASE_CAPSULE | Freq: Every day | ORAL | Status: DC

## 2014-04-02 NOTE — Assessment & Plan Note (Signed)
Continue to encourage increased activity for weight loss Wt Readings from Last 3 Encounters:  04/02/14 248 lb (112.492 kg)  02/05/14 242 lb 1.6 oz (109.816 kg)  01/16/14 255 lb (115.667 kg)   Body mass index is 34.6 kg/(m^2).

## 2014-04-02 NOTE — Assessment & Plan Note (Signed)
Reviewed #s. Continue fish oil and simvastatin. Encouraged increased aerobic activity to increase HDL.

## 2014-04-02 NOTE — Assessment & Plan Note (Signed)
Improved #s this year.

## 2014-04-02 NOTE — Assessment & Plan Note (Signed)
See above

## 2014-04-02 NOTE — Progress Notes (Signed)
BP 124/68  Pulse 72  Temp(Src) 98.5 F (36.9 C) (Oral)  Ht 5\' 11"  (1.803 m)  Wt 248 lb (112.492 kg)  BMI 34.60 kg/m2   CC: CPE  Subjective:    Patient ID: Bryan Fields, male    DOB: 11/30/1960, 53 y.o.   MRN: [id number]  HPI: Bryan Fields is a 53 y.o. male presenting on 04/02/2014 for Annual Exam   Pleasant 53 yo with h/o colon adenocarcinoma found 03/2010, s/p R hemicolectomy. His final pathology showed a 6.5 cm adenocarcinoma with 2 of 15 nodes positive. (T3N1). He has seen Dr. Lorrin Mais who completed his chemotherapy in May 2012. Also followed by surgery Dr. [name] now Dr Barry Dienes. Recently underwent dx laparoscopy and L hepatectomy 01/2014. Pathology confirmed metastatic adenocarcinoma with negative surgical margins and 2 negative portal lymph nodes and hemosiderin deposition in the liver. Latest stage is IIIB (pT2 N1b). Next appt with neurology is 04/2014 - sees Q3 months. Overall in good spirits, feeling well.  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  Worsening GERD at night time. Denies nausea, dysphagia.   Wt Readings from Last 3 Encounters:  04/02/14 248 lb (112.492 kg)  02/05/14 242 lb 1.6 oz (109.816 kg)  01/16/14 255 lb (115.667 kg)   Lab Results  Component Value Date   VITAMINB12 2952* 03/16/2013    Preventative: Prostate cancer screening - went to urologist told slightly swollen prostate. Rare nocturia. Strong stream. Will screen today. Colon cancer screening - see above.  Flu shot - will get at work.  Tetanus 2009.  Seat belt use discussed.  Sunscreen use discussed. No suspicious spots on skin.   Caffeine: drinks sweet tea - 3 glasses/day  Married, lives with wife, 2 children  Occupation: Production designer, theatre/television/film  Activity: house repairs, flips houses on side. No regular exercise.   Diet: some vegetables/fruits. Good amt water   Relevant past medical, surgical, family and social history reviewed and updated as  indicated.  Allergies and medications reviewed and updated. Current Outpatient Prescriptions on File Prior to Visit  Medication Sig  . Cyanocobalamin (VITAMIN B-12) 2500 MCG SUBL Place 1 tablet under the tongue daily.  . Omega-3 Fatty Acids (OMEGA 3 PO) Take 1,400 mg by mouth 2 (two) times daily.  . sildenafil (VIAGRA) 100 MG tablet Take 100 mg by mouth daily as needed for erectile dysfunction.   No current facility-administered medications on file prior to visit.    Review of Systems  Constitutional: Negative for fever, chills, activity change, appetite change, fatigue and unexpected weight change.  HENT: Negative for hearing loss.   Eyes: Negative for visual disturbance.  Respiratory: Negative for cough, chest tightness, shortness of breath and wheezing.   Cardiovascular: Negative for chest pain, palpitations and leg swelling.  Gastrointestinal: Negative for nausea, vomiting, abdominal pain, diarrhea, constipation, blood in stool and abdominal distention.  Genitourinary: Negative for hematuria and difficulty urinating.  Musculoskeletal: Negative for arthralgias, myalgias and neck pain.  Skin: Negative for rash.  Neurological: Negative for dizziness, seizures, syncope and headaches.  Hematological: Negative for adenopathy. Does not bruise/bleed easily.  Psychiatric/Behavioral: Negative for dysphoric mood. The patient is not nervous/anxious.    Per HPI unless specifically indicated above    Objective:    BP 124/68  Pulse 72  Temp(Src) 98.5 F (36.9 C) (Oral)  Ht 5\' 11"  (1.803 m)  Wt 248 lb (112.492 kg)  BMI 34.60 kg/m2  Physical Exam  Nursing note and vitals reviewed. Constitutional:  He is oriented to person, place, and time. He appears well-developed and well-nourished. No distress.  HENT:  Head: Normocephalic and atraumatic.  Right Ear: Hearing, tympanic membrane, external ear and ear canal normal.  Left Ear: Hearing, tympanic membrane, external ear and ear canal normal.    Nose: Nose normal.  Mouth/Throat: Uvula is midline, oropharynx is clear and moist and mucous membranes are normal. No oropharyngeal exudate, posterior oropharyngeal edema or posterior oropharyngeal erythema.  Eyes: Conjunctivae and EOM are normal. Pupils are equal, round, and reactive to light. No scleral icterus.  Neck: Normal range of motion. Neck supple. No thyromegaly present.  Cardiovascular: Normal rate, regular rhythm, normal heart sounds and intact distal pulses.   No murmur heard. Pulses:      Radial pulses are 2+ on the right side, and 2+ on the left side.  Pulmonary/Chest: Effort normal and breath sounds normal. No respiratory distress. He has no wheezes. He has no rales.  Abdominal: Soft. Bowel sounds are normal. He exhibits no distension and no mass. There is no tenderness. There is no rebound and no guarding.  Genitourinary: Rectum normal and prostate normal. Rectal exam shows no external hemorrhoid, no internal hemorrhoid, no fissure, no mass, no tenderness and anal tone normal. Prostate is not enlarged (20gm) and not tender.  H/o fissure  Musculoskeletal: Normal range of motion. He exhibits no edema.  Lymphadenopathy:    He has no cervical adenopathy.  Neurological: He is alert and oriented to person, place, and time.  CN grossly intact, station and gait intact  Skin: Skin is warm and dry. No rash noted.  Psychiatric: He has a normal mood and affect. His behavior is normal. Judgment and thought content normal.   Results for orders placed in visit on 03/19/14  LIPID PANEL      Result Value Ref Range   Cholesterol 126  0 - 200 mg/dL   Triglycerides 139.0  0.0 - 149.0 mg/dL   HDL 31.60 (*) >39.00 mg/dL   VLDL 27.8  0.0 - 40.0 mg/dL   LDL Cholesterol 67  0 - 99 mg/dL   Total CHOL/HDL Ratio 4     NonHDL 94.40    COMPREHENSIVE METABOLIC PANEL      Result Value Ref Range   Sodium 137  135 - 145 mEq/L   Potassium 4.6  3.5 - 5.1 mEq/L   Chloride 101  96 - 112 mEq/L   CO2  27  19 - 32 mEq/L   Glucose, Bld 95  70 - 99 mg/dL   BUN 19  6 - 23 mg/dL   Creatinine, Ser 1.1  0.4 - 1.5 mg/dL   Total Bilirubin 0.8  0.2 - 1.2 mg/dL   Alkaline Phosphatase 62  39 - 117 U/L   AST 29  0 - 37 U/L   ALT 39  0 - 53 U/L   Total Protein 7.7  6.0 - 8.3 g/dL   Albumin 3.7  3.5 - 5.2 g/dL   Calcium 9.3  8.4 - 10.5 mg/dL   GFR 70.75  >60.00 mL/min  PSA      Result Value Ref Range   PSA 0.60  0.10 - 4.00 ng/mL      Assessment & Plan:   Problem List Items Addressed This Visit   Obesity      Continue to encourage increased activity for weight loss Wt Readings from Last 3 Encounters:  04/02/14 248 lb (112.492 kg)  02/05/14 242 lb 1.6 oz (109.816 kg)  01/16/14 255  lb (115.667 kg)   Body mass index is 34.6 kg/(m^2).    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 Doing well s/p surgery. Following closely with onc and surg. Appreciate their care of patient.    Relevant Medications      Hydrocodone-Acetaminophen 5-300 MG TABS   Malignant neoplasm of colon     See above.    Relevant Medications      Hydrocodone-Acetaminophen 5-300 MG TABS   Hyperglycemia     Improved #s this year.    HYPERCHOLESTEROLEMIA     Reviewed #s. Continue fish oil and simvastatin. Encouraged increased aerobic activity to increase HDL.    Relevant Medications      atenolol (TENORMIN) tablet      simvastatin (ZOCOR) tablet   Healthcare maintenance - Primary     Preventative protocols reviewed and updated unless pt declined. Discussed healthy diet and lifestyle. Flu shot at work.    Essential hypertension     Chronic, stable. Continue metoprolol    Relevant Medications      atenolol (TENORMIN) tablet      simvastatin (ZOCOR) tablet       Follow up plan: Return in about 1 year (around 04/03/2015), or as needed, for annual exam, prior fasting for blood work.

## 2014-04-02 NOTE — Assessment & Plan Note (Signed)
Preventative protocols reviewed and updated unless pt declined. Discussed healthy diet and lifestyle. Flu shot at work.

## 2014-04-02 NOTE — Assessment & Plan Note (Signed)
S/p L hemihepatectomy. Doing well s/p surgery. Following closely with onc and surg. Appreciate their care of patient.

## 2014-04-02 NOTE — Assessment & Plan Note (Signed)
Chronic, stable. Continue metoprolol. 

## 2014-04-02 NOTE — Patient Instructions (Addendum)
Good to see you, call us with questions. Next colonoscopy would be due 03/2015 Return as needed or in 1 year for next physical. Work on increased aerobic activities. Try omeprazole for heartburn.

## 2014-04-02 NOTE — Progress Notes (Signed)
Pre visit review using our clinic review tool, if applicable. No additional management support is needed unless otherwise documented below in the visit note. 

## 2014-04-03 ENCOUNTER — Telehealth: Payer: Self-pay | Admitting: Family Medicine

## 2014-04-03 NOTE — Telephone Encounter (Signed)
emmi emailed °

## 2014-04-26 ENCOUNTER — Ambulatory Visit (HOSPITAL_BASED_OUTPATIENT_CLINIC_OR_DEPARTMENT_OTHER): Payer: 59 | Admitting: Oncology

## 2014-04-26 ENCOUNTER — Other Ambulatory Visit: Payer: 59

## 2014-04-26 ENCOUNTER — Telehealth: Payer: Self-pay | Admitting: Oncology

## 2014-04-26 VITALS — BP 129/67 | HR 57 | Temp 98.3°F | Resp 18 | Ht 71.0 in | Wt 251.1 lb

## 2014-04-26 DIAGNOSIS — L989 Disorder of the skin and subcutaneous tissue, unspecified: Secondary | ICD-10-CM

## 2014-04-26 DIAGNOSIS — C189 Malignant neoplasm of colon, unspecified: Secondary | ICD-10-CM

## 2014-04-26 DIAGNOSIS — Z85038 Personal history of other malignant neoplasm of large intestine: Secondary | ICD-10-CM

## 2014-04-26 LAB — CEA: CEA: <0.5 ng/mL (ref 0.0–5.0)

## 2014-04-26 NOTE — Telephone Encounter (Signed)
s.w. pt wife and advised on Feb 2016 appts...Marland Kitchenok and aware.Marland KitchenMarland Kitchenpt will come back to get barium

## 2014-04-26 NOTE — Progress Notes (Signed)
  [place] OFFICE PROGRESS NOTE   Diagnosis: Colon cancer  INTERVAL HISTORY:   Bryan Fields returns as scheduled. He feels well. He continues to have mild soreness near the surgical site. No other complaint. Stable soft nodular area near the right parotid. Good appetite.  Objective:  Vital signs in last 24 hours:  Blood pressure 129/67, pulse 57, temperature 98.3 F (36.8 C), temperature source Oral, resp. rate 18, height $RemoveBe'5\' 11"'bHzaDDwYq$  (1.803 m), weight 251 lb 1.6 oz (113.898 kg).    HEENT: One to 1.5 cm soft fullness in the cutaneous tissue inferior and posterior to the right parotid gland. Lymphatics: No cervical, supra-clavicular, axillary, or inguinal nodes Resp: Lungs clear bilaterally Cardio: Regular rate and rhythm GI: No hepatosplenomegaly, no mass Vascular: No leg edema   Lab Results  Component Value Date   CEA <0.5 04/26/2014    Medications: I have reviewed the patient's current medications.  Assessment/Plan: 1. 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 He began treatment with FOLFOX in December 2011. He completed cycle #12 on 10/27/2010. Oxaliplatin was held with cycles number 7 through 10 and resumed with cycle #11. 2. History of neutropenia secondary to chemotherapy. 3. History of thrombocytopenia secondary chemotherapy. 4. He did not undergo a preoperative colonoscopy. He underwent a colonoscopy on 12/19/2010 with findings of a 1 cm polyp at 90 cm from the anal verge, minimal polyp at 30 cm from the anal verge and minimal diverticulosis. Colonoscopy 03/18/2012-negative. 5. 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 He began study drug on 05/13/2010. 6. Status post Port-A-Cath placement. The Port-A-Cath has been removed. 7. 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  Resolved.  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 9. Low attenuation lesion in the caudate lobe of the liver on the CT 04/11/2012-? Cyst. MRI liver on 04/20/2012 favored the caudate lobe liver lesion to represent a cyst or complex cyst. CT abdomen/pelvis 04/17/2013 with continued enlargement of hypovascular lesion in the caudate lobe of the liver.  PET scan 10/27/2011 confirmed a hypermetabolic caudate lobe liver lesion, tiny indeterminate lung nodules, and a 9 mm level II left neck node   CT biopsy of the liver lesion 11/07/2013 confirmed adenocarcinoma  Left liver and caudate resection 01/15/2014-pathology confirmed metastatic colon cancer, and negative surgical margins 10. Iron deposition within the liver noted on MRI 04/20/2012. The ferritin level was normal on 04/17/2013. Increased iron deposition noted on the left liver resection 01/15/2014 11. Pain/bleeding at the anus reported when here 04/24/2013. Status post evaluation by Dr. [name] with findings of an anal fissure. 12. Right face cystic lesion-likely a benign sebaceous cyst, he will ask Dr. Barry Dienes to evaluate this lesion when she sees him next month   Disposition:  Bryan Fields remains in clinical remission from colon cancer. He will be scheduled for a restaging CT evaluation and office visit in 3 months. He is scheduled to see Dr. Barry Dienes next month. He will ask her to evaluate the apparent cyst at the right face. Betsy Coder, MD  04/26/2014  4:52 PM

## 2014-04-27 ENCOUNTER — Telehealth: Payer: Self-pay | Admitting: *Deleted

## 2014-04-27 NOTE — Telephone Encounter (Signed)
-----   Message from Ladell Pier, MD sent at 04/27/2014  8:29 AM EST ----- Please call patient, cea is normal

## 2014-04-27 NOTE — Telephone Encounter (Signed)
Left VM for patient to check MyChart for CEA results.

## 2014-07-24 ENCOUNTER — Other Ambulatory Visit (HOSPITAL_BASED_OUTPATIENT_CLINIC_OR_DEPARTMENT_OTHER): Payer: 59

## 2014-07-24 ENCOUNTER — Encounter (HOSPITAL_COMMUNITY): Payer: Self-pay

## 2014-07-24 ENCOUNTER — Other Ambulatory Visit: Payer: 59

## 2014-07-24 ENCOUNTER — Ambulatory Visit (HOSPITAL_COMMUNITY)
Admission: RE | Admit: 2014-07-24 | Discharge: 2014-07-24 | Disposition: A | Discharge status: Home / Self Care | Payer: 59 | Source: Ambulatory Visit | Attending: Oncology | Admitting: Oncology

## 2014-07-24 DIAGNOSIS — C189 Malignant neoplasm of colon, unspecified: Secondary | ICD-10-CM

## 2014-07-24 DIAGNOSIS — C787 Secondary malignant neoplasm of liver and intrahepatic bile duct: Secondary | ICD-10-CM | POA: Insufficient documentation

## 2014-07-24 DIAGNOSIS — Z85038 Personal history of other malignant neoplasm of large intestine: Secondary | ICD-10-CM

## 2014-07-24 DIAGNOSIS — I10 Essential (primary) hypertension: Secondary | ICD-10-CM | POA: Insufficient documentation

## 2014-07-24 DIAGNOSIS — Z9221 Personal history of antineoplastic chemotherapy: Secondary | ICD-10-CM | POA: Insufficient documentation

## 2014-07-24 LAB — COMPREHENSIVE METABOLIC PANEL (CC13)
ALBUMIN: 4.1 g/dL (ref 3.5–5.0)
ALT: 22 U/L (ref 0–55)
ANION GAP: 9 meq/L (ref 3–11)
AST: 23 U/L (ref 5–34)
Alkaline Phosphatase: 55 U/L (ref 40–150)
BILIRUBIN TOTAL: 0.57 mg/dL (ref 0.20–1.20)
BUN: 19.6 mg/dL (ref 7.0–26.0)
CHLORIDE: 103 meq/L (ref 98–109)
CO2: 26 mEq/L (ref 22–29)
Calcium: 9.2 mg/dL (ref 8.4–10.4)
Creatinine: 1.3 mg/dL (ref 0.7–1.3)
EGFR: 64 mL/min/{1.73_m2} — ABNORMAL LOW (ref >90)
Glucose: 94 mg/dl (ref 70–140)
POTASSIUM: 4.5 meq/L (ref 3.5–5.1)
SODIUM: 137 meq/L (ref 136–145)
Total Protein: 7.2 g/dL (ref 6.4–8.3)

## 2014-07-24 MED ORDER — IOHEXOL 300 MG/ML  SOLN
100.0000 mL | Freq: Once | INTRAMUSCULAR | Status: AC | PRN
  Administered 2014-07-24: 100 mL via INTRAVENOUS

## 2014-07-25 ENCOUNTER — Other Ambulatory Visit: Payer: Self-pay | Admitting: Oncology

## 2014-07-25 ENCOUNTER — Telehealth: Payer: Self-pay | Admitting: Oncology

## 2014-07-25 DIAGNOSIS — C189 Malignant neoplasm of colon, unspecified: Secondary | ICD-10-CM

## 2014-07-25 LAB — CEA: CEA: 2 ng/mL (ref 0.0–5.0)

## 2014-07-25 NOTE — Telephone Encounter (Signed)
, °

## 2014-07-31 ENCOUNTER — Telehealth: Payer: Self-pay | Admitting: Oncology

## 2014-07-31 ENCOUNTER — Ambulatory Visit (HOSPITAL_BASED_OUTPATIENT_CLINIC_OR_DEPARTMENT_OTHER): Payer: 59 | Admitting: Oncology

## 2014-07-31 VITALS — BP 134/78 | HR 61 | Temp 98.7°F | Resp 19 | Ht 71.0 in | Wt 251.1 lb

## 2014-07-31 DIAGNOSIS — C189 Malignant neoplasm of colon, unspecified: Secondary | ICD-10-CM

## 2014-07-31 DIAGNOSIS — Z85038 Personal history of other malignant neoplasm of large intestine: Secondary | ICD-10-CM

## 2014-07-31 DIAGNOSIS — R1900 Intra-abdominal and pelvic swelling, mass and lump, unspecified site: Secondary | ICD-10-CM

## 2014-07-31 NOTE — Telephone Encounter (Signed)
Pt confirmed labs/ov per 02/23 POF, gave pt AVS... KJ, schedule pt for referral consultation to have port added for chemotherapy with Dr. Barry Dienes 03/15 @2 :45 that was the earliest that she had and will let Dr. Benay Spice know so that he will know when he is scheduled.

## 2014-07-31 NOTE — Progress Notes (Signed)
[facility] OFFICE PROGRESS NOTE   Diagnosis: Colon cancer  INTERVAL HISTORY:   Bryan Fields returns as scheduled. He feels well. He has discomfort at the upper abdomen surgical site with a deep cough. He has noted a fullness in the subxiphoid region for the past 2 months. He had noted a smaller fullness in this region for several years. CTs of the chest, abdomen, and pelvis on 07/24/2014 revealed no evidence of metastatic disease in the chest. A 2.5 center lymph node is noted at the surgical margin of the right liver, additional lymph nodes in the porta hepatis measure up to 2 cm. 2.2 cm peritoneal implant beneath the left mid abdominal wall, 3 cm implant on the 30 midline upper abdominal wall with an overlying 5.4 cm implant in the subcutaneous tissue of the mid anterior abdominal wall. Objective:  Vital signs in last 24 hours:  Blood pressure 134/78, pulse 61, temperature 98.7 F (37.1 C), temperature source Oral, resp. rate 19, height $RemoveBe'5\' 11"'xaWpibWFW$  (1.803 m), weight 251 lb 1.6 oz (113.898 kg), SpO2 100 %.    HEENT: Neck without mass Lymphatics: No cervical, supra-clavicular, axillary, or inguinal nodes Resp: Lungs clear bilaterally Cardio: Regular rate and rhythm GI: No hepatosplenomegaly, no apparent ascites, abdominal wall mass at the mid upper abdomen near the upper aspect of the midline incision. The mass measures approximate 8 cm in transverse dimension Vascular: No leg edema   Lab Results:  Lab Results  Component Value Date   WBC 4.8 01/22/2014   HGB 11.5* 01/22/2014   HCT 33.9* 01/22/2014   MCV 96.0 01/22/2014   PLT 261 01/22/2014   NEUTROABS 3.0 01/09/2014    Lab Results  Component Value Date   NA 137 07/24/2014    Lab Results  Component Value Date   CEA 2.0 07/24/2014    Medications: I have reviewed the patient's current medications.  Assessment/Plan: 1. 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 He began treatment with FOLFOX in December 2011. He completed cycle #12 on 10/27/2010. Oxaliplatin was held with cycles number 7 through 10 and resumed with cycle #11. 2. History of neutropenia secondary to chemotherapy. 3. History of thrombocytopenia secondary chemotherapy. 4. He did not undergo a preoperative colonoscopy. He underwent a colonoscopy on 12/19/2010 with findings of a 1 cm polyp at 90 cm from the anal verge, minimal polyp at 30 cm from the anal verge and minimal diverticulosis. Colonoscopy 03/18/2012-negative. 5. 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 He began study drug on 05/13/2010. 6. Status post Port-A-Cath placement. The Port-A-Cath has been removed. 7. 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 9. Low attenuation lesion in the caudate lobe of the liver on the CT 04/11/2012-? Cyst. MRI liver on 04/20/2012 favored the caudate lobe liver lesion to represent a cyst or complex cyst. CT abdomen/pelvis 04/17/2013 with continued enlargement of hypovascular lesion in the caudate lobe of the liver.  PET scan 10/27/2011 confirmed a hypermetabolic caudate lobe liver lesion, tiny indeterminate lung nodules, and a 9 mm level II left neck node   CT biopsy of the liver lesion 11/07/2013 confirmed adenocarcinoma  Left liver and caudate resection 01/15/2014-pathology confirmed metastatic colon cancer, and negative surgical margins  Surveillance CT scans 07/24/2014 consistent with recurrent disease involving upper abdominal lymph nodes,  peritoneal implants, and an anterior abdominal wall mass 10. Iron deposition within the liver noted on MRI 04/20/2012. The ferritin level was normal on 04/17/2013. Increased iron deposition noted on the left liver resection  01/15/2014 11. Pain/bleeding at the anus reported when here 04/24/2013. Status post evaluation by Dr. [name] with findings of an anal fissure.       12. Right face cystic lesion  Disposition:  Bryan Fields appears to have progressive metastatic colon cancer. I reviewed the CT images with Bryan Fields and his wife. We discussed the likely diagnosis and treatment options. No therapy will be curative if a biopsy of the anterior abdominal wall mass confirms metastatic colon cancer. He was treated with FOLFOX in the adjuvant setting and the tumor has a K-ras mutation. I recommend FOLFIRI/Avastin. I reviewed the potential toxicities associated with the FOLFIRI regimen including the chance for nausea/vomiting, and excised, diarrhea, alopecia, and hematologic toxicity. We discussed the rash, hyperpigmentation, and hand/foot syndrome associated with 5-fluorouracil. We reviewed the diarrhea and abdominal discomfort seen with irinotecan. We discussed the allergic reaction, thromboembolic disease, delayed wound healing, bleeding, bowel perforation, and hypertension associated with Avastin. He agrees to proceed. I discussed the [place] irinotecan genotype dosing study with him. We also discussed the NSABP MPR-1 study. He met with a research nurse today and will consider enrollment on these critical trials. Bryan Fields will return for an office visit and further discussion on 08/08/2014. He is scheduled for a biopsy of the abdominal wall mass on 08/06/2014.  Approximate 40 minutes were spent with patient today. The majority of the time was used for counseling and coordination of care.  Betsy Coder, MD  07/31/2014  9:26 AM

## 2014-08-01 ENCOUNTER — Encounter: Payer: Self-pay | Admitting: *Deleted

## 2014-08-01 NOTE — [facility] Oncology Navigator Note (Signed)
In basket message to Dr. Barry Dienes and her CMA requesting PAC as soon as possible.

## 2014-08-01 NOTE — Progress Notes (Signed)
Dr. Benay Spice requesting MSI, IHC testing on 04/03/2010 tumor. Case [id number]. Request sent to [facility] Pathology Department.

## 2014-08-02 ENCOUNTER — Other Ambulatory Visit (HOSPITAL_COMMUNITY)
Admission: RE | Admit: 2014-08-02 | Discharge: 2014-08-02 | Disposition: A | Discharge status: Home / Self Care | Payer: 59 | Source: Ambulatory Visit | Attending: Surgery | Admitting: Surgery

## 2014-08-02 ENCOUNTER — Other Ambulatory Visit: Payer: Self-pay | Admitting: Radiology

## 2014-08-02 DIAGNOSIS — C189 Malignant neoplasm of colon, unspecified: Secondary | ICD-10-CM | POA: Diagnosis not present

## 2014-08-03 ENCOUNTER — Other Ambulatory Visit: Payer: Self-pay | Admitting: Radiology

## 2014-08-06 ENCOUNTER — Ambulatory Visit (HOSPITAL_COMMUNITY)
Admission: RE | Admit: 2014-08-06 | Discharge: 2014-08-06 | Disposition: A | Discharge status: Home / Self Care | Payer: 59 | Source: Ambulatory Visit | Attending: Diagnostic Radiology | Admitting: Diagnostic Radiology

## 2014-08-06 ENCOUNTER — Other Ambulatory Visit: Payer: Self-pay | Admitting: Oncology

## 2014-08-06 ENCOUNTER — Encounter (HOSPITAL_COMMUNITY): Payer: Self-pay

## 2014-08-06 ENCOUNTER — Ambulatory Visit (HOSPITAL_COMMUNITY)
Admission: RE | Admit: 2014-08-06 | Discharge: 2014-08-06 | Disposition: A | Discharge status: Home / Self Care | Payer: 59 | Source: Ambulatory Visit | Attending: Oncology | Admitting: Oncology

## 2014-08-06 DIAGNOSIS — R1900 Intra-abdominal and pelvic swelling, mass and lump, unspecified site: Secondary | ICD-10-CM | POA: Diagnosis not present

## 2014-08-06 DIAGNOSIS — I10 Essential (primary) hypertension: Secondary | ICD-10-CM | POA: Diagnosis not present

## 2014-08-06 DIAGNOSIS — E780 Pure hypercholesterolemia: Secondary | ICD-10-CM | POA: Insufficient documentation

## 2014-08-06 DIAGNOSIS — Z79899 Other long term (current) drug therapy: Secondary | ICD-10-CM | POA: Insufficient documentation

## 2014-08-06 DIAGNOSIS — Z87891 Personal history of nicotine dependence: Secondary | ICD-10-CM | POA: Insufficient documentation

## 2014-08-06 DIAGNOSIS — N400 Enlarged prostate without lower urinary tract symptoms: Secondary | ICD-10-CM | POA: Insufficient documentation

## 2014-08-06 DIAGNOSIS — C189 Malignant neoplasm of colon, unspecified: Secondary | ICD-10-CM

## 2014-08-06 DIAGNOSIS — Z9089 Acquired absence of other organs: Secondary | ICD-10-CM | POA: Insufficient documentation

## 2014-08-06 DIAGNOSIS — Z85038 Personal history of other malignant neoplasm of large intestine: Secondary | ICD-10-CM | POA: Insufficient documentation

## 2014-08-06 DIAGNOSIS — R222 Localized swelling, mass and lump, trunk: Secondary | ICD-10-CM | POA: Insufficient documentation

## 2014-08-06 DIAGNOSIS — N529 Male erectile dysfunction, unspecified: Secondary | ICD-10-CM | POA: Diagnosis not present

## 2014-08-06 DIAGNOSIS — Z9049 Acquired absence of other specified parts of digestive tract: Secondary | ICD-10-CM | POA: Diagnosis not present

## 2014-08-06 LAB — CBC
HCT: 46 % (ref 39.0–52.0)
Hemoglobin: 15.9 g/dL (ref 13.0–17.0)
MCH: 33.3 pg (ref 26.0–34.0)
MCHC: 34.6 g/dL (ref 30.0–36.0)
MCV: 96.4 fL (ref 78.0–100.0)
Platelets: 242 10*3/uL (ref 150–400)
RBC: 4.77 MIL/uL (ref 4.22–5.81)
RDW: 12.8 % (ref 11.5–15.5)
WBC: 6 10*3/uL (ref 4.0–10.5)

## 2014-08-06 LAB — PROTIME-INR
INR: 0.94 (ref 0.00–1.49)
Prothrombin Time: 12.7 seconds (ref 11.6–15.2)

## 2014-08-06 LAB — APTT: aPTT: 30 seconds (ref 24–37)

## 2014-08-06 MED ORDER — HYDROCODONE-ACETAMINOPHEN 5-325 MG PO TABS: 1.0000 | ORAL_TABLET | ORAL | Status: DC | PRN

## 2014-08-06 MED ORDER — LIDOCAINE HCL 1 % IJ SOLN
INTRAMUSCULAR | Status: AC
Start: 2014-08-06 — End: 2014-08-06
  Filled 2014-08-06: qty 20

## 2014-08-06 MED ORDER — SODIUM CHLORIDE 0.9 % IV SOLN
INTRAVENOUS | Status: DC
  Administered 2014-08-06: 10:00:00 via INTRAVENOUS
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
## 2014-08-06 NOTE — Procedures (Signed)
US guided core biopsies of abdominal wall mass.  4 - 18 gauge cores obtained.  No immediate complication.

## 2014-08-07 ENCOUNTER — Encounter (HOSPITAL_COMMUNITY): Payer: Self-pay

## 2014-08-08 ENCOUNTER — Telehealth: Payer: Self-pay

## 2014-08-08 ENCOUNTER — Ambulatory Visit (HOSPITAL_BASED_OUTPATIENT_CLINIC_OR_DEPARTMENT_OTHER): Payer: 59 | Admitting: Nurse Practitioner

## 2014-08-08 ENCOUNTER — Telehealth: Payer: Self-pay | Admitting: *Deleted

## 2014-08-08 ENCOUNTER — Telehealth: Payer: Self-pay | Admitting: Oncology

## 2014-08-08 ENCOUNTER — Other Ambulatory Visit: Payer: Self-pay | Admitting: *Deleted

## 2014-08-08 ENCOUNTER — Other Ambulatory Visit (HOSPITAL_BASED_OUTPATIENT_CLINIC_OR_DEPARTMENT_OTHER): Payer: 59

## 2014-08-08 ENCOUNTER — Telehealth: Payer: Self-pay | Admitting: Nurse Practitioner

## 2014-08-08 ENCOUNTER — Encounter: Payer: Self-pay | Admitting: Medical Oncology

## 2014-08-08 VITALS — BP 138/71 | HR 52 | Temp 98.0°F | Resp 18 | Ht 71.0 in | Wt 251.9 lb

## 2014-08-08 DIAGNOSIS — C189 Malignant neoplasm of colon, unspecified: Secondary | ICD-10-CM

## 2014-08-08 DIAGNOSIS — C792 Secondary malignant neoplasm of skin: Secondary | ICD-10-CM

## 2014-08-08 DIAGNOSIS — C787 Secondary malignant neoplasm of liver and intrahepatic bile duct: Secondary | ICD-10-CM

## 2014-08-08 DIAGNOSIS — C782 Secondary malignant neoplasm of pleura: Secondary | ICD-10-CM

## 2014-08-08 LAB — UA PROTEIN, DIPSTICK - CHCC: Protein, ur: NEGATIVE mg/dL

## 2014-08-08 LAB — RESEARCH LABS

## 2014-08-08 MED ORDER — HYDROCODONE-ACETAMINOPHEN 5-325 MG PO TABS: 1.0000 | ORAL_TABLET | Freq: Four times a day (QID) | ORAL | Status: DC | PRN

## 2014-08-08 NOTE — Telephone Encounter (Signed)
Labs/ov added per 03/02 POF, sent msg to add chemo, chemo already added after msg sent and pt given schedule.... KJ

## 2014-08-08 NOTE — Telephone Encounter (Signed)
Labs/ov per 03/02 POF, sent msg to add chemo and will call pt once added.... KJ

## 2014-08-08 NOTE — Telephone Encounter (Signed)
added tx appts and gave pt avs report and schedule for march. other appts per 3/2 pof already on schedule.

## 2014-08-08 NOTE — Telephone Encounter (Signed)
Called to see if any MSI and IHC testing has been done on pt. [facility] faxed information over. Placed on Ned Card' desk

## 2014-08-08 NOTE — Telephone Encounter (Signed)
Per staff message and POF I have scheduled appts. Advised scheduler of appts. JMW  

## 2014-08-08 NOTE — Progress Notes (Addendum)
[place] OFFICE PROGRESS NOTE   Diagnosis: Colon cancer   INTERVAL HISTORY:   Bryan Fields returns as scheduled. He underwent a biopsy of the anterior abdominal wall mass in interventional radiology on 08/06/2014. Pathology showed metastatic adenocarcinoma with abundant extracellular mucin consistent with a colon primary.  He is having mild pain associated with the abdominal wall mass. He has tried Aleve. He denies nausea/vomiting. No mouth sores. No diarrhea.  Objective:  Vital signs in last 24 hours:  Blood pressure 138/71, pulse 52, temperature 98 F (36.7 C), temperature source Oral, resp. rate 18, height $RemoveBe'5\' 11"'LkDEBYAbO$  (1.803 m), weight 251 lb 14.4 oz (114.261 kg).    HEENT: No thrush or ulcers. Resp: Lungs clear bilaterally. Cardio: Regular rate and rhythm. GI: Approximate 8 cm mid upper abdominal wall mass. No hepatomegaly. Vascular: No leg edema.  Lab Results:  Lab Results  Component Value Date   WBC 6.0 08/06/2014   HGB 15.9 08/06/2014   HCT 46.0 08/06/2014   MCV 96.4 08/06/2014   PLT 242 08/06/2014   NEUTROABS 3.0 01/09/2014    Imaging:  No results found.  Medications: I have reviewed the patient's current medications.  Assessment/Plan: 1. 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 Microsatellite stable; preserved expression of major and minor MMR proteins. He began treatment with FOLFOX in December 2011. He completed cycle #12 on 10/27/2010. Oxaliplatin was held with cycles number 7 through 10 and resumed with cycle #11. 2. History of neutropenia secondary to chemotherapy. 3. History of thrombocytopenia secondary chemotherapy. 4. He did not undergo a preoperative colonoscopy. He underwent a colonoscopy on 12/19/2010 with findings of a 1 cm polyp at 90 cm from the anal verge, minimal polyp at 30 cm from the anal verge and minimal  diverticulosis. Colonoscopy 03/18/2012-negative. 5. 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 He began study drug on 05/13/2010. 6. Status post Port-A-Cath placement. The Port-A-Cath has been removed. 7. 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 9. Low attenuation lesion in the caudate lobe of the liver on the CT 04/11/2012-? Cyst. MRI liver on 04/20/2012 favored the caudate lobe liver lesion to represent a cyst or complex cyst. CT abdomen/pelvis 04/17/2013 with continued enlargement of hypovascular lesion in the caudate lobe of the liver.  PET scan 10/27/2011 confirmed a hypermetabolic caudate lobe liver lesion, tiny indeterminate lung nodules, and a 9 mm level II left neck node   CT biopsy of the liver lesion 11/07/2013 confirmed adenocarcinoma  Left liver and caudate resection 01/15/2014-pathology confirmed metastatic colon cancer, and negative surgical margins  Surveillance CT scans 07/24/2014 consistent with recurrent disease involving upper abdominal lymph nodes, peritoneal implants, and an anterior abdominal wall mass  Status post biopsy of the anterior abdominal wall mass 08/06/2014 with pathology showing metastatic adenocarcinoma consistent with a colon primary. 10. Iron deposition within the liver noted on MRI 04/20/2012. The ferritin level was normal on 04/17/2013. Increased iron deposition noted on the left liver resection 01/15/2014 11. Pain/bleeding at the anus reported when here 04/24/2013. Status post evaluation by Dr. [name] with findings of an anal fissure. 12. Right face cystic lesion    Disposition: Bryan Fields appears stable. The biopsy of the abdominal wall mass confirmed metastatic colon cancer. Results were reviewed with Bryan Fields and his wife at today's  visit.  Dr. Benay Spice recommends  treatment with FOLFIRI/Avastin. We again reviewed potential toxicities. He is interested in the [facility] irinotecan genotype dosing study. The research nurses met with him at today's visit.  He is scheduled for Port-A-Cath placement on 08/14/2014. We are scheduling him for cycle one FOLFIRI/Avastin 08/20/2014. We will see him prior to treatment that day.  He was given a prescription for hydrocodone every 6 hours as needed for pain associated with the abdominal wall mass.  Patient seen with Dr. Benay Spice. 25 minutes were spent face-to-face at today's visit with the majority of that time involved in counseling/coordination of care.    Ned Card ANP/GNP-BC   08/08/2014  12:23 PM  This was a shared visit with Ned Card. Bryan Fields has metastatic colon cancer. He agrees to enrollment on the [facility] study. We reviewed the potential toxicities associated with FOLFIRI and Avastin. He agrees to proceed.  Julieanne Manson, M.D.

## 2014-08-09 ENCOUNTER — Encounter (HOSPITAL_BASED_OUTPATIENT_CLINIC_OR_DEPARTMENT_OTHER): Payer: Self-pay | Admitting: *Deleted

## 2014-08-09 NOTE — Progress Notes (Signed)
Labs done-has had a PAC in past

## 2014-08-09 NOTE — Progress Notes (Signed)
   08/09/14 1110  OBSTRUCTIVE SLEEP APNEA  Have you ever been diagnosed with sleep apnea through a sleep study? No  Do you snore loudly (loud enough to be heard through closed doors)?  0  Do you often feel tired, fatigued, or sleepy during the daytime? 0  Has anyone observed you stop breathing during your sleep? 0  Do you have, or are you being treated for high blood pressure? 1  BMI more than 35 kg/m2? 0  Age over 56 years old? 1  Neck circumference greater than 40 cm/16 inches? 1  Gender: 1

## 2014-08-10 ENCOUNTER — Other Ambulatory Visit (HOSPITAL_BASED_OUTPATIENT_CLINIC_OR_DEPARTMENT_OTHER): Payer: 59

## 2014-08-10 ENCOUNTER — Other Ambulatory Visit: Payer: Self-pay | Admitting: Medical Oncology

## 2014-08-10 DIAGNOSIS — C787 Secondary malignant neoplasm of liver and intrahepatic bile duct: Secondary | ICD-10-CM

## 2014-08-10 DIAGNOSIS — C189 Malignant neoplasm of colon, unspecified: Secondary | ICD-10-CM

## 2014-08-10 DIAGNOSIS — C792 Secondary malignant neoplasm of skin: Secondary | ICD-10-CM

## 2014-08-10 LAB — COMPREHENSIVE METABOLIC PANEL (CC13)
ALK PHOS: 59 U/L (ref 40–150)
ALT: 26 U/L (ref 0–55)
AST: 23 U/L (ref 5–34)
Albumin: 4 g/dL (ref 3.5–5.0)
Anion Gap: 10 mEq/L (ref 3–11)
BILIRUBIN TOTAL: 0.44 mg/dL (ref 0.20–1.20)
BUN: 18 mg/dL (ref 7.0–26.0)
CHLORIDE: 104 meq/L (ref 98–109)
CO2: 25 mEq/L (ref 22–29)
Calcium: 8.9 mg/dL (ref 8.4–10.4)
Creatinine: 1.3 mg/dL (ref 0.7–1.3)
EGFR: 61 mL/min/{1.73_m2} — ABNORMAL LOW (ref >90)
Glucose: 114 mg/dl (ref 70–140)
Potassium: 4.2 mEq/L (ref 3.5–5.1)
Sodium: 139 mEq/L (ref 136–145)
TOTAL PROTEIN: 7.1 g/dL (ref 6.4–8.3)

## 2014-08-10 LAB — CBC WITH DIFFERENTIAL/PLATELET
BASO%: 0.3 % (ref 0.0–2.0)
BASOS ABS: 0 10*3/uL (ref 0.0–0.1)
EOS%: 1.9 % (ref 0.0–7.0)
Eosinophils Absolute: 0.1 10*3/uL (ref 0.0–0.5)
HEMATOCRIT: 44 % (ref 38.4–49.9)
HEMOGLOBIN: 15.2 g/dL (ref 13.0–17.1)
LYMPH#: 2.2 10*3/uL (ref 0.9–3.3)
LYMPH%: 34.5 % (ref 14.0–49.0)
MCH: 33 pg (ref 27.2–33.4)
MCHC: 34.5 g/dL (ref 32.0–36.0)
MCV: 95.7 fL (ref 79.3–98.0)
MONO#: 0.4 10*3/uL (ref 0.1–0.9)
MONO%: 6.6 % (ref 0.0–14.0)
NEUT#: 3.6 10*3/uL (ref 1.5–6.5)
NEUT%: 56.7 % (ref 39.0–75.0)
PLATELETS: 249 10*3/uL (ref 140–400)
RBC: 4.6 10*6/uL (ref 4.20–5.82)
RDW: 12.7 % (ref 11.0–14.6)
WBC: 6.4 10*3/uL (ref 4.0–10.3)

## 2014-08-10 LAB — CEA: CEA: 3.1 ng/mL (ref 0.0–5.0)

## 2014-08-10 LAB — MAGNESIUM (CC13): Magnesium: 2.4 mg/dl (ref 1.5–2.5)

## 2014-08-14 ENCOUNTER — Encounter (HOSPITAL_BASED_OUTPATIENT_CLINIC_OR_DEPARTMENT_OTHER): Payer: Self-pay

## 2014-08-14 ENCOUNTER — Ambulatory Visit (HOSPITAL_BASED_OUTPATIENT_CLINIC_OR_DEPARTMENT_OTHER)
Admission: RE | Admit: 2014-08-14 | Discharge: 2014-08-14 | Disposition: A | Discharge status: Home / Self Care | Payer: 59 | Source: Ambulatory Visit | Attending: General Surgery | Admitting: General Surgery

## 2014-08-14 ENCOUNTER — Ambulatory Visit (HOSPITAL_COMMUNITY): Payer: 59

## 2014-08-14 ENCOUNTER — Encounter (HOSPITAL_BASED_OUTPATIENT_CLINIC_OR_DEPARTMENT_OTHER)
Admission: RE | Disposition: A | Discharge status: Home / Self Care | Payer: Self-pay | Source: Ambulatory Visit | Attending: General Surgery

## 2014-08-14 ENCOUNTER — Ambulatory Visit (HOSPITAL_BASED_OUTPATIENT_CLINIC_OR_DEPARTMENT_OTHER): Payer: 59 | Admitting: Certified Registered"

## 2014-08-14 DIAGNOSIS — Z95828 Presence of other vascular implants and grafts: Secondary | ICD-10-CM

## 2014-08-14 DIAGNOSIS — C189 Malignant neoplasm of colon, unspecified: Secondary | ICD-10-CM | POA: Diagnosis not present

## 2014-08-14 DIAGNOSIS — Z9049 Acquired absence of other specified parts of digestive tract: Secondary | ICD-10-CM | POA: Diagnosis not present

## 2014-08-14 DIAGNOSIS — C787 Secondary malignant neoplasm of liver and intrahepatic bile duct: Secondary | ICD-10-CM | POA: Diagnosis not present

## 2014-08-14 DIAGNOSIS — I10 Essential (primary) hypertension: Secondary | ICD-10-CM | POA: Insufficient documentation

## 2014-08-14 DIAGNOSIS — Z87891 Personal history of nicotine dependence: Secondary | ICD-10-CM | POA: Diagnosis not present

## 2014-08-14 HISTORY — PX: PORTACATH PLACEMENT: SHX2246

## 2014-08-14 SURGERY — INSERTION, TUNNELED CENTRAL VENOUS DEVICE, WITH PORT
Anesthesia: General | Site: Chest | Laterality: Left

## 2014-08-14 MED ORDER — HEPARIN SOD (PORK) LOCK FLUSH 100 UNIT/ML IV SOLN
INTRAVENOUS | Status: DC | PRN
Start: 2014-08-14 — End: 2014-08-14
  Administered 2014-08-14: 500 [IU]

## 2014-08-14 MED ORDER — HEPARIN (PORCINE) IN NACL 2-0.9 UNIT/ML-% IJ SOLN
INTRAMUSCULAR | Status: AC
  Filled 2014-08-14: qty 500

## 2014-08-14 MED ORDER — FENTANYL CITRATE 0.05 MG/ML IJ SOLN
INTRAMUSCULAR | Status: AC
  Filled 2014-08-14: qty 6

## 2014-08-14 MED ORDER — LIDOCAINE HCL (CARDIAC) 20 MG/ML IV SOLN
INTRAVENOUS | Status: DC | PRN
  Administered 2014-08-14: 50 mg via INTRAVENOUS

## 2014-08-14 MED ORDER — FENTANYL CITRATE 0.05 MG/ML IJ SOLN: 50.0000 ug | INTRAMUSCULAR | Status: DC | PRN

## 2014-08-14 MED ORDER — PROPOFOL 10 MG/ML IV BOLUS
INTRAVENOUS | Status: DC | PRN
  Administered 2014-08-14: 200 mg via INTRAVENOUS

## 2014-08-14 MED ORDER — BUPIVACAINE-EPINEPHRINE (PF) 0.5% -1:200000 IJ SOLN
INTRAMUSCULAR | Status: AC
  Filled 2014-08-14: qty 30

## 2014-08-14 MED ORDER — ACETAMINOPHEN 650 MG RE SUPP: 650.0000 mg | RECTAL | Status: DC | PRN

## 2014-08-14 MED ORDER — OXYCODONE HCL 5 MG PO TABS: 5.0000 mg | ORAL_TABLET | ORAL | Status: DC | PRN

## 2014-08-14 MED ORDER — PROMETHAZINE HCL 25 MG/ML IJ SOLN: 6.2500 mg | INTRAMUSCULAR | Status: DC | PRN

## 2014-08-14 MED ORDER — OXYCODONE-ACETAMINOPHEN 5-325 MG PO TABS: 1.0000 | ORAL_TABLET | ORAL | Status: DC | PRN

## 2014-08-14 MED ORDER — DEXAMETHASONE SODIUM PHOSPHATE 4 MG/ML IJ SOLN
INTRAMUSCULAR | Status: DC | PRN
  Administered 2014-08-14: 10 mg via INTRAVENOUS

## 2014-08-14 MED ORDER — OXYCODONE HCL 5 MG/5ML PO SOLN: 5.0000 mg | Freq: Once | ORAL | Status: DC | PRN

## 2014-08-14 MED ORDER — HEPARIN SOD (PORK) LOCK FLUSH 100 UNIT/ML IV SOLN
INTRAVENOUS | Status: AC
Start: 2014-08-14 — End: 2014-08-14
  Filled 2014-08-14: qty 5

## 2014-08-14 MED ORDER — FENTANYL CITRATE 0.05 MG/ML IJ SOLN
INTRAMUSCULAR | Status: DC | PRN
  Administered 2014-08-14 (×2): 50 ug via INTRAVENOUS

## 2014-08-14 MED ORDER — MIDAZOLAM HCL 2 MG/2ML IJ SOLN
INTRAMUSCULAR | Status: AC
  Filled 2014-08-14: qty 2

## 2014-08-14 MED ORDER — MIDAZOLAM HCL 5 MG/5ML IJ SOLN
INTRAMUSCULAR | Status: DC | PRN
  Administered 2014-08-14: 2 mg via INTRAVENOUS

## 2014-08-14 MED ORDER — HEPARIN (PORCINE) IN NACL 2-0.9 UNIT/ML-% IJ SOLN
INTRAMUSCULAR | Status: DC | PRN
  Administered 2014-08-14: 500 mL

## 2014-08-14 MED ORDER — ACETAMINOPHEN 325 MG PO TABS: 650.0000 mg | ORAL_TABLET | ORAL | Status: DC | PRN

## 2014-08-14 MED ORDER — CEFAZOLIN SODIUM-DEXTROSE 2-3 GM-% IV SOLR
INTRAVENOUS | Status: DC | PRN
  Administered 2014-08-14: 2 g via INTRAVENOUS

## 2014-08-14 MED ORDER — ONDANSETRON HCL 4 MG/2ML IJ SOLN
INTRAMUSCULAR | Status: DC | PRN
  Administered 2014-08-14: 4 mg via INTRAVENOUS

## 2014-08-14 MED ORDER — OXYCODONE HCL 5 MG PO TABS: 5.0000 mg | ORAL_TABLET | Freq: Once | ORAL | Status: DC | PRN

## 2014-08-14 MED ORDER — BUPIVACAINE-EPINEPHRINE 0.5% -1:200000 IJ SOLN
INTRAMUSCULAR | Status: DC | PRN
  Administered 2014-08-14: 20 mL

## 2014-08-14 MED ORDER — MIDAZOLAM HCL 2 MG/2ML IJ SOLN: 1.0000 mg | INTRAMUSCULAR | Status: DC | PRN

## 2014-08-14 MED ORDER — LACTATED RINGERS IV SOLN
INTRAVENOUS | Status: DC
  Administered 2014-08-14 (×2): via INTRAVENOUS

## 2014-08-14 MED ORDER — HYDROMORPHONE HCL 1 MG/ML IJ SOLN: 0.2500 mg | INTRAMUSCULAR | Status: DC | PRN

## 2014-08-14 SURGICAL SUPPLY — 42 items
BAG DECANTER FOR FLEXI CONT (MISCELLANEOUS) ×3 IMPLANT
BLADE HEX COATED 2.75 (ELECTRODE) ×3 IMPLANT
BLADE SURG 11 STRL SS (BLADE) ×3 IMPLANT
BLADE SURG 15 STRL LF DISP TIS (BLADE) ×1 IMPLANT
BLADE SURG 15 STRL SS (BLADE) ×2
CHLORAPREP W/TINT 26ML (MISCELLANEOUS) ×3 IMPLANT
COVER BACK TABLE 60X90IN (DRAPES) ×3 IMPLANT
COVER MAYO STAND STRL (DRAPES) ×3 IMPLANT
DECANTER SPIKE VIAL GLASS SM (MISCELLANEOUS) IMPLANT
DRAPE C-ARM 42X72 X-RAY (DRAPES) ×3 IMPLANT
DRAPE LAPAROTOMY TRNSV 102X78 (DRAPE) ×3 IMPLANT
DRAPE UTILITY XL STRL (DRAPES) ×3 IMPLANT
DRSG TEGADERM 4X4.75 (GAUZE/BANDAGES/DRESSINGS) IMPLANT
ELECT REM PT RETURN 9FT ADLT (ELECTROSURGICAL) ×3
ELECTRODE REM PT RTRN 9FT ADLT (ELECTROSURGICAL) ×1 IMPLANT
GLOVE BIO SURGEON STRL SZ 6 (GLOVE) ×3 IMPLANT
GLOVE BIO SURGEON STRL SZ7 (GLOVE) ×3 IMPLANT
GLOVE BIOGEL PI IND STRL 6.5 (GLOVE) ×1 IMPLANT
GLOVE BIOGEL PI INDICATOR 6.5 (GLOVE) ×2
GLOVE EXAM NITRILE EXT CUFF MD (GLOVE) ×3 IMPLANT
GOWN STRL REUS W/ TWL LRG LVL3 (GOWN DISPOSABLE) IMPLANT
GOWN STRL REUS W/ TWL XL LVL3 (GOWN DISPOSABLE) ×1 IMPLANT
GOWN STRL REUS W/TWL 2XL LVL3 (GOWN DISPOSABLE) ×3 IMPLANT
GOWN STRL REUS W/TWL LRG LVL3 (GOWN DISPOSABLE)
GOWN STRL REUS W/TWL XL LVL3 (GOWN DISPOSABLE) ×3
KIT PORT POWER 8FR ISP CVUE (Catheter) ×3 IMPLANT
LIQUID BAND (GAUZE/BANDAGES/DRESSINGS) ×3 IMPLANT
NEEDLE HYPO 25X1 1.5 SAFETY (NEEDLE) ×3 IMPLANT
PACK BASIN DAY SURGERY FS (CUSTOM PROCEDURE TRAY) ×3 IMPLANT
PENCIL BUTTON HOLSTER BLD 10FT (ELECTRODE) ×3 IMPLANT
SLEEVE SCD COMPRESS KNEE MED (MISCELLANEOUS) ×3 IMPLANT
SPONGE GAUZE 4X4 12PLY STER LF (GAUZE/BANDAGES/DRESSINGS) IMPLANT
SUT MNCRL AB 4-0 PS2 18 (SUTURE) ×3 IMPLANT
SUT PROLENE 2 0 SH DA (SUTURE) ×6 IMPLANT
SUT VIC AB 3-0 SH 27 (SUTURE) ×2
SUT VIC AB 3-0 SH 27X BRD (SUTURE) ×1 IMPLANT
SUT VICRYL 3-0 CR8 SH (SUTURE) IMPLANT
SYR 5ML LUER SLIP (SYRINGE) ×3 IMPLANT
SYR CONTROL 10ML LL (SYRINGE) ×3 IMPLANT
SYRINGE 10CC LL (SYRINGE) ×3 IMPLANT
TOWEL OR 17X24 6PK STRL BLUE (TOWEL DISPOSABLE) ×3 IMPLANT
TOWEL OR NON WOVEN STRL DISP B (DISPOSABLE) IMPLANT

## 2014-08-14 NOTE — Op Note (Signed)
PREOPERATIVE DIAGNOSIS:  Recurrent colon cancer     POSTOPERATIVE DIAGNOSIS:  Same     PROCEDURE: Left subclavian port placement, Bard PowerPort  Power Port, MRI safe, 8-French.      SURGEON:  Stark Klein, MD      ANESTHESIA:  General   FINDINGS:  Good venous return, easy flush, and tip of the catheter and   SVC 27 cm.      SPECIMEN:  None.      ESTIMATED BLOOD LOSS:  Minimal.      COMPLICATIONS:  None known.      PROCEDURE:  Pt was identified in the holding area and taken to   the operating room, where patient was placed supine on the operating room   table.  General anesthesia was induced.  Patient's arms were tucked and the upper   chest and neck were prepped and draped in sterile fashion.  Time-out was   performed according to the surgical safety check list.  When all was   correct, we continued.   Local anesthetic was administered over this   area at the angle of the clavicle.  The vein was accessed with 1 pass of the needle. There was good venous return and the wire passed easily with no ectopy.   Fluoroscopy was used to evaluate the wire position.  It appeared to be in the subclavian vein, but it made a turn in the SVC and into the heart.  It was pulled back and attempted to be repositioned, but it kept turning.  The wire was removed, and the left subclavian vein was reaccessed with 1 pass of the needle.  This time the wire passed into the IVC.       The patient was placed back level and the area for the pocket was anethetized   with local anesthetic.  A 3-cm transverse incision was made with a #15   blade in the prior site of the port a cath.  Cautery was used to divide the subcutaneous tissues down to the   pectoralis muscle.  An [employer] retractor was used to elevate the skin   while a pocket was created on top of the pectoralis fascia.  The port   was placed into the pocket to confirm that it was of adequate size.  The   catheter was preattached to the port.  The port  was then secured to the   pectoralis fascia with four 2-0 Prolene sutures.  These were clamped and   not tied down yet.    The catheter was tunneled through to the wire exit   site.  The catheter was placed along the wire to determine what length it should be to be in the SVC.  The catheter was cut at 27 cm.  The tunneler sheath and dilator were passed over the wire and the dilator and wire were removed.  The catheter was advanced through the tunneler sheath and the tunneler sheath was pulled away.  Care was taken to keep the catheter in the tunneler sheath as this occurred. This was advanced and the tunneler sheath was removed.  There was good venous   return and easy flush of the catheter.  The Prolene sutures were tied   down to the pectoral fascia.  The skin was reapproximated using 3-0   Vicryl interrupted deep dermal sutures.    Fluoroscopy was used to re-confirm good position of the catheter.  The skin   was then closed using 4-0 Monocryl in  a subcuticular fashion.  The port was flushed with concentrated heparin flush as well.  The wounds were then cleaned, dried, and dressed with Dermabond.  The patient was awakened from anesthesia and taken to the PACU in stable condition.  Needle, sponge, and instrument counts were correct.               Stark Klein, MD

## 2014-08-14 NOTE — Anesthesia Postprocedure Evaluation (Signed)
Anesthesia Post Note  Patient: Bryan Fields  Procedure(s) Performed: Procedure(s) (LRB): INSERTION PORT-A-CATH (Left)  Anesthesia type: general  Patient location: PACU  Post pain: Pain level controlled  Post assessment: Patient's Cardiovascular Status Stable  Last Vitals:  Filed Vitals:   08/14/14 1317  BP: 126/79  Pulse: 60  Temp: 36.4 C  Resp: 18    Post vital signs: Reviewed and stable  Level of consciousness: sedated  Complications: No apparent anesthesia complications

## 2014-08-14 NOTE — H&P (Signed)
  Bryan Fields. [facility]  Location: [facility] Patient #: [id number] DOB: [birth year] Married / Language: English / Race: White Male  History of Present Illness  Patient words: post-op.  The patient is a 54 year old male who presents for a follow-up for Colorectal cancer. The patient is being seen for follow up for Stage IIIA well differentiated adenocarcinoma of the right colon.. Disease involvement included first-level lymph nodes.Marland Kitchen He underwent right colectomy by Dr. [name] in 2011. The disease is metastatic to the liver (Liver met definitively identified and biopsied in 2015 ). The patient was referred by a specialty consultant (Dr. Benay Spice of oncology).. Initial presentation was Initial presentation was 4 month(s) ago for an incidental spot on the liver. He was referred for resection. Current diagnosis was determined by biopsy, then followed by laparotomy with left hepatectomy with caudate resection.   Pt has now developed recurrence.      Allergies No Known Drug Allergies  Medication History  Roxicet (5-$RemoveBef'325MG'hpSooykoAG$  Tablet, 1-2 Tablet Oral every six hours, as needed, Active. Atenolol ($RemoveBeforeDEI'50MG'ZtmGyKAtwJbJMfrn$  Tablet, Oral daily) Active. Omega-3 & Omega-6 Fish Oil (Oral daily) Active. Senokot S (8.6-$RemoveBefore'50MG'RHCzSRvaCKUlf$  Tablet, Oral daily) Active. Viagra ($RemoveBeforeD'100MG'LTRalYTFhRHKkP$  Tablet, Oral daily) Active. Zocor ($RemoveBefore'40MG'DoiFxfzYGuXaV$  Tablet, Oral daily) Active.  Review of SystemsAll other systems negative   Vitals Wt Readings from Last 3 Encounters:  08/09/14 250 lb (113.399 kg)  08/08/14 251 lb 14.4 oz (114.261 kg)  07/31/14 251 lb 1.6 oz (113.898 kg)   Temp Readings from Last 3 Encounters:  08/14/14 98.3 F (36.8 C) Oral  08/08/14 98 F (36.7 C) Oral  08/06/14 98.7 F (37.1 C) Oral   BP Readings from Last 3 Encounters:  08/14/14 127/79  08/08/14 138/71  08/06/14 127/83   Pulse Readings from Last 3 Encounters:  08/14/14 57  08/08/14 52  08/06/14 58     Physical Exam General Mental Status-Alert. General  Appearance-Consistent with stated age. Hydration-Well hydrated. Voice-Normal.  Abdomen Note: incision healing well. no hernia. lateral portion of wound completely healed, no drainage     Assessment & Plan  METASTATIC COLON CANCER TO LIVER (C18.9) Impression:  Pt with evidence of recurrence.   Will place port a cath.    Signed by Stark Klein, MD

## 2014-08-14 NOTE — Anesthesia Procedure Notes (Signed)
Procedure Name: LMA Insertion Date/Time: 08/14/2014 11:38 AM Performed by: Toula Moos L Pre-anesthesia Checklist: Patient identified, Emergency Drugs available, Suction available, Patient being monitored and Timeout performed Patient Re-evaluated:Patient Re-evaluated prior to inductionOxygen Delivery Method: Circle System Utilized Preoxygenation: Pre-oxygenation with 100% oxygen Intubation Type: IV induction Ventilation: Mask ventilation without difficulty LMA: LMA inserted LMA Size: 5.0 Number of attempts: 1 Airway Equipment and Method: Bite block Placement Confirmation: positive ETCO2 Tube secured with: Tape Dental Injury: Teeth and Oropharynx as per pre-operative assessment

## 2014-08-14 NOTE — Anesthesia Preprocedure Evaluation (Signed)
Anesthesia Evaluation  Patient identified by MRN, date of birth, ID band Patient awake    Reviewed: Allergy & Precautions, H&P , NPO status , Patient's Chart, lab work & pertinent test results  Airway Mallampati: II  TM Distance: >3 FB Neck ROM: full    Dental  (+) Teeth Intact, Dental Advidsory Given   Pulmonary former smoker,  breath sounds clear to auscultation        Cardiovascular hypertension, On Home Beta Blockers and On Medications Rhythm:regular Rate:Normal     Neuro/Psych negative neurological ROS  negative psych ROS   GI/Hepatic Neg liver ROS,   Endo/Other  negative endocrine ROS  Renal/GU negative Renal ROS     Musculoskeletal   Abdominal   Peds  Hematology   Anesthesia Other Findings   Reproductive/Obstetrics negative OB ROS                             Anesthesia Physical Anesthesia Plan  ASA: III  Anesthesia Plan: General LMA   Post-op Pain Management:    Induction:   Airway Management Planned:   Additional Equipment:   Intra-op Plan:   Post-operative Plan:   Informed Consent: I have reviewed the patients History and Physical, chart, labs and discussed the procedure including the risks, benefits and alternatives for the proposed anesthesia with the patient or authorized representative who has indicated his/her understanding and acceptance.   Dental Advisory Given  Plan Discussed with: Anesthesiologist, CRNA and Surgeon  Anesthesia Plan Comments:         Anesthesia Quick Evaluation

## 2014-08-14 NOTE — Transfer of Care (Signed)
Immediate Anesthesia Transfer of Care Note  Patient: Bryan Fields  Procedure(s) Performed: Procedure(s): INSERTION PORT-A-CATH (Left)  Patient Location: PACU  Anesthesia Type:General  Level of Consciousness: awake and patient cooperative  Airway & Oxygen Therapy: Patient Spontanous Breathing and Patient connected to face mask oxygen  Post-op Assessment: Report given to RN and Post -op Vital signs reviewed and stable  Post vital signs: Reviewed and stable  Last Vitals:  Filed Vitals:   08/14/14 1002  BP: 127/79  Pulse: 57  Temp: 36.8 C  Resp: 20    Complications: No apparent anesthesia complications

## 2014-08-14 NOTE — Discharge Instructions (Addendum)
[facility] °Office Phone Number [phone] ° ° POST OP INSTRUCTIONS ° °Always review your discharge instruction sheet given to you by the facility where your surgery was performed. ° °IF YOU HAVE DISABILITY OR FAMILY LEAVE FORMS, YOU MUST BRING THEM TO THE OFFICE FOR PROCESSING.  DO NOT GIVE THEM TO YOUR DOCTOR. ° °1. A prescription for pain medication may be given to you upon discharge.  Take your pain medication as prescribed, if needed.  If narcotic pain medicine is not needed, then you may take acetaminophen (Tylenol) or ibuprofen (Advil) as needed. °2. Take your usually prescribed medications unless otherwise directed °3. If you need a refill on your pain medication, please contact your pharmacy.  They will contact our office to request authorization.  Prescriptions will not be filled after 5pm or on week-ends. °4. You should eat very light the first 24 hours after surgery, such as soup, crackers, pudding, etc.  Resume your normal diet the day after surgery °5. It is common to experience some constipation if taking pain medication after surgery.  Increasing fluid intake and taking a stool softener will usually help or prevent this problem from occurring.  A mild laxative (Milk of Magnesia or Miralax) should be taken according to package directions if there are no bowel movements after 48 hours. °6. You may shower in 48 hours.  The surgical glue will flake off in 2-3 weeks.   °7. ACTIVITIES:  No strenuous activity or heavy lifting for 1 week.   °a. You may drive when you no longer are taking prescription pain medication, you can comfortably wear a seatbelt, and you can safely maneuver your car and apply brakes. °b. RETURN TO WORK:  __________to be determined_______________ °You should see your doctor in the office for a follow-up appointment approximately three-four weeks after your surgery.   ° °WHEN TO CALL YOUR DOCTOR: °1. Fever over 101.0 °2. Nausea and/or vomiting. °3. Extreme swelling or  bruising. °4. Continued bleeding from incision. °5. Increased pain, redness, or drainage from the incision. ° °The clinic staff is available to answer your questions during regular business hours.  Please don’t hesitate to call and ask to speak to one of the nurses for clinical concerns.  If you have a medical emergency, go to the nearest emergency room or call 911.  A surgeon from [facility] is always on call at the hospital. ° °For further questions, please visit [email or web address]  ° ° ° ° °Post Anesthesia Home Care Instructions ° °Activity: °Get plenty of rest for the remainder of the day. A responsible adult should stay with you for 24 hours following the procedure.  °For the next 24 hours, DO NOT: °-Drive a car °-Operate machinery °-Drink alcoholic beverages °-Take any medication unless instructed by your physician °-Make any legal decisions or sign important papers. ° °Meals: °Start with liquid foods such as gelatin or soup. Progress to regular foods as tolerated. Avoid greasy, spicy, heavy foods. If nausea and/or vomiting occur, drink only clear liquids until the nausea and/or vomiting subsides. Call your physician if vomiting continues. ° °Special Instructions/Symptoms: °Your throat may feel dry or sore from the anesthesia or the breathing tube placed in your throat during surgery. If this causes discomfort, gargle with warm salt water. The discomfort should disappear within 24 hours. ° °

## 2014-08-15 ENCOUNTER — Other Ambulatory Visit: Payer: Self-pay | Admitting: *Deleted

## 2014-08-15 ENCOUNTER — Encounter (HOSPITAL_BASED_OUTPATIENT_CLINIC_OR_DEPARTMENT_OTHER): Payer: Self-pay | Admitting: General Surgery

## 2014-08-15 MED ORDER — PROCHLORPERAZINE MALEATE 10 MG PO TABS: 10.0000 mg | ORAL_TABLET | Freq: Four times a day (QID) | ORAL | Status: DC | PRN

## 2014-08-15 MED ORDER — LIDOCAINE-PRILOCAINE 2.5-2.5 % EX CREA: TOPICAL_CREAM | CUTANEOUS | Status: DC

## 2014-08-15 NOTE — Telephone Encounter (Signed)
Informed pt that Compazine and EMLA prescriptions have been sent to [facility]. Pt familiar with EMLA instructions. Denies questions.

## 2014-08-16 ENCOUNTER — Other Ambulatory Visit: Payer: Self-pay | Admitting: Medical Oncology

## 2014-08-16 DIAGNOSIS — C189 Malignant neoplasm of colon, unspecified: Secondary | ICD-10-CM

## 2014-08-19 ENCOUNTER — Other Ambulatory Visit: Payer: Self-pay | Admitting: Oncology

## 2014-08-20 ENCOUNTER — Telehealth: Payer: Self-pay | Admitting: Oncology

## 2014-08-20 ENCOUNTER — Ambulatory Visit (HOSPITAL_BASED_OUTPATIENT_CLINIC_OR_DEPARTMENT_OTHER): Payer: 59 | Admitting: Oncology

## 2014-08-20 ENCOUNTER — Ambulatory Visit (HOSPITAL_BASED_OUTPATIENT_CLINIC_OR_DEPARTMENT_OTHER): Payer: 59

## 2014-08-20 ENCOUNTER — Encounter: Payer: Self-pay | Admitting: Medical Oncology

## 2014-08-20 ENCOUNTER — Other Ambulatory Visit (HOSPITAL_BASED_OUTPATIENT_CLINIC_OR_DEPARTMENT_OTHER): Payer: 59

## 2014-08-20 ENCOUNTER — Telehealth: Payer: Self-pay | Admitting: *Deleted

## 2014-08-20 VITALS — BP 134/81 | HR 57 | Temp 97.8°F | Resp 19 | Ht 71.0 in | Wt 251.3 lb

## 2014-08-20 DIAGNOSIS — Z006 Encounter for examination for normal comparison and control in clinical research program: Secondary | ICD-10-CM

## 2014-08-20 DIAGNOSIS — C787 Secondary malignant neoplasm of liver and intrahepatic bile duct: Secondary | ICD-10-CM

## 2014-08-20 DIAGNOSIS — C189 Malignant neoplasm of colon, unspecified: Secondary | ICD-10-CM | POA: Diagnosis not present

## 2014-08-20 DIAGNOSIS — Z5112 Encounter for antineoplastic immunotherapy: Secondary | ICD-10-CM

## 2014-08-20 LAB — CBC WITH DIFFERENTIAL/PLATELET
BASO%: 0.3 % (ref 0.0–2.0)
BASOS ABS: 0 10*3/uL (ref 0.0–0.1)
EOS%: 2.2 % (ref 0.0–7.0)
Eosinophils Absolute: 0.2 10*3/uL (ref 0.0–0.5)
HCT: 47.1 % (ref 38.4–49.9)
HEMOGLOBIN: 16.5 g/dL (ref 13.0–17.1)
LYMPH%: 32.9 % (ref 14.0–49.0)
MCH: 33.5 pg — ABNORMAL HIGH (ref 27.2–33.4)
MCHC: 35 g/dL (ref 32.0–36.0)
MCV: 95.5 fL (ref 79.3–98.0)
MONO#: 0.6 10*3/uL (ref 0.1–0.9)
MONO%: 8 % (ref 0.0–14.0)
NEUT#: 4 10*3/uL (ref 1.5–6.5)
NEUT%: 56.6 % (ref 39.0–75.0)
PLATELETS: 247 10*3/uL (ref 140–400)
RBC: 4.93 10*6/uL (ref 4.20–5.82)
RDW: 12.5 % (ref 11.0–14.6)
WBC: 7.1 10*3/uL (ref 4.0–10.3)
lymph#: 2.4 10*3/uL (ref 0.9–3.3)

## 2014-08-20 LAB — COMPREHENSIVE METABOLIC PANEL (CC13)
ALT: 26 U/L (ref 0–55)
ANION GAP: 9 meq/L (ref 3–11)
AST: 20 U/L (ref 5–34)
Albumin: 3.6 g/dL (ref 3.5–5.0)
Alkaline Phosphatase: 55 U/L (ref 40–150)
BILIRUBIN TOTAL: 0.55 mg/dL (ref 0.20–1.20)
BUN: 23.1 mg/dL (ref 7.0–26.0)
CO2: 23 mEq/L (ref 22–29)
Calcium: 9.2 mg/dL (ref 8.4–10.4)
Chloride: 102 mEq/L (ref 98–109)
Creatinine: 1.2 mg/dL (ref 0.7–1.3)
EGFR: 66 mL/min/{1.73_m2} — ABNORMAL LOW (ref >90)
Glucose: 100 mg/dl (ref 70–140)
POTASSIUM: 4.2 meq/L (ref 3.5–5.1)
SODIUM: 135 meq/L — AB (ref 136–145)
Total Protein: 7.1 g/dL (ref 6.4–8.3)

## 2014-08-20 LAB — UA PROTEIN, DIPSTICK - CHCC: Protein, ur: NEGATIVE mg/dL

## 2014-08-20 LAB — MAGNESIUM (CC13): Magnesium: 2.5 mg/dl (ref 1.5–2.5)

## 2014-08-20 LAB — RESEARCH LABS

## 2014-08-20 MED ORDER — IRINOTECAN HCL CHEMO INJECTION 100 MG/5ML
260.0000 mg/m2 | Freq: Once | INTRAVENOUS | Status: AC
  Administered 2014-08-20: 618 mg via INTRAVENOUS
  Filled 2014-08-20: qty 30.9

## 2014-08-20 MED ORDER — BEVACIZUMAB CHEMO INJECTION 400 MG/16ML
5.0000 mg/kg | Freq: Once | INTRAVENOUS | Status: AC
  Administered 2014-08-20: 575 mg via INTRAVENOUS
  Filled 2014-08-20: qty 23

## 2014-08-20 MED ORDER — SODIUM CHLORIDE 0.9 % IV SOLN
2400.0000 mg/m2 | INTRAVENOUS | Status: DC
  Administered 2014-08-20: 5700 mg via INTRAVENOUS
  Filled 2014-08-20: qty 114

## 2014-08-20 MED ORDER — LEUCOVORIN CALCIUM INJECTION 350 MG
400.0000 mg/m2 | Freq: Once | INTRAMUSCULAR | Status: AC
  Administered 2014-08-20: 952 mg via INTRAVENOUS
  Filled 2014-08-20: qty 47.6

## 2014-08-20 MED ORDER — PALONOSETRON HCL INJECTION 0.25 MG/5ML
INTRAVENOUS | Status: AC
  Filled 2014-08-20: qty 5

## 2014-08-20 MED ORDER — SODIUM CHLORIDE 0.9 % IV SOLN
Freq: Once | INTRAVENOUS | Status: AC
  Administered 2014-08-20: 10:00:00 via INTRAVENOUS

## 2014-08-20 MED ORDER — SODIUM CHLORIDE 0.9 % IV SOLN: Freq: Once | INTRAVENOUS | Status: DC

## 2014-08-20 MED ORDER — ATROPINE SULFATE 1 MG/ML IJ SOLN
INTRAMUSCULAR | Status: AC
  Filled 2014-08-20: qty 1

## 2014-08-20 MED ORDER — ATROPINE SULFATE 1 MG/ML IJ SOLN
0.5000 mg | Freq: Once | INTRAMUSCULAR | Status: AC | PRN
  Administered 2014-08-20: 0.5 mg via INTRAVENOUS

## 2014-08-20 MED ORDER — FLUOROURACIL CHEMO INJECTION 2.5 GM/50ML
400.0000 mg/m2 | Freq: Once | INTRAVENOUS | Status: AC
  Administered 2014-08-20: 950 mg via INTRAVENOUS
  Filled 2014-08-20: qty 19

## 2014-08-20 MED ORDER — SODIUM CHLORIDE 0.9 % IV SOLN
Freq: Once | INTRAVENOUS | Status: AC
  Administered 2014-08-20: 10:00:00 via INTRAVENOUS
  Filled 2014-08-20: qty 5

## 2014-08-20 MED ORDER — PALONOSETRON HCL INJECTION 0.25 MG/5ML
0.2500 mg | Freq: Once | INTRAVENOUS | Status: AC
  Administered 2014-08-20: 0.25 mg via INTRAVENOUS

## 2014-08-20 NOTE — Telephone Encounter (Signed)
Per staff message and POF I have scheduled appts. Advised scheduler of appts. JMW  

## 2014-08-20 NOTE — Patient Instructions (Signed)
[facility] Discharge Instructions for Patients Receiving Chemotherapy  Today you received the following chemotherapy agents: Avastin, Irinotecan, Leucovorin, 5FU (push/pump)  To help prevent nausea and vomiting after your treatment, we encourage you to take your nausea medication as prescribed by your physician: Compazine 10 mg every 6 hrs as needed for nausea.   If you develop nausea and vomiting that is not controlled by your nausea medication, call the clinic.   BELOW ARE SYMPTOMS THAT SHOULD BE REPORTED IMMEDIATELY:  *FEVER GREATER THAN 100.5 F  *CHILLS WITH OR WITHOUT FEVER  NAUSEA AND VOMITING THAT IS NOT CONTROLLED WITH YOUR NAUSEA MEDICATION  *UNUSUAL SHORTNESS OF BREATH  *UNUSUAL BRUISING OR BLEEDING  TENDERNESS IN MOUTH AND THROAT WITH OR WITHOUT PRESENCE OF ULCERS  *URINARY PROBLEMS  *BOWEL PROBLEMS  UNUSUAL RASH Items with * indicate a potential emergency and should be followed up as soon as possible.  Feel free to call the clinic you have any questions or concerns. The clinic phone number is [phone].  Bevacizumab injection (Avastin) What is this medicine? BEVACIZUMAB (be va SIZ yoo mab) is a chemotherapy drug. It targets a protein found in many cancer cell types, and halts cancer growth. This drug treats many cancers including non-small cell lung cancer, ovarian cancer, cervical cancer, and colon or rectal cancer. It is usually given with other chemotherapy drugs. This medicine may be used for other purposes; ask your health care provider or pharmacist if you have questions. COMMON BRAND NAME(S): Avastin What should I tell my health care provider before I take this medicine? They need to know if you have any of these conditions: -blood clots -heart disease, including heart failure, heart attack, or chest pain (angina) -high blood pressure -infection (especially a virus infection such as chickenpox, cold sores, or herpes) -kidney  disease -lung disease -prior chemotherapy with doxorubicin, daunorubicin, epirubicin, or other anthracycline type chemotherapy agents -recent or ongoing radiation therapy -recent surgery -stroke -an unusual or allergic reaction to bevacizumab, hamster proteins, mouse proteins, other medicines, foods, dyes, or preservatives -pregnant or trying to get pregnant -breast-feeding How should I use this medicine? This medicine is for infusion into a vein. It is given by a health care professional in a hospital or clinic setting. Talk to your pediatrician regarding the use of this medicine in children. Special care may be needed. Overdosage: If you think you have taken too much of this medicine contact a poison control center or emergency room at once. NOTE: This medicine is only for you. Do not share this medicine with others. What if I miss a dose? It is important not to miss your dose. Call your doctor or health care professional if you are unable to keep an appointment. What may interact with this medicine? Interactions are not expected. This list may not describe all possible interactions. Give your health care provider a list of all the medicines, herbs, non-prescription drugs, or dietary supplements you use. Also tell them if you smoke, drink alcohol, or use illegal drugs. Some items may interact with your medicine. What should I watch for while using this medicine? Your condition will be monitored carefully while you are receiving this medicine. You will need important blood work and urine testing done while you are taking this medicine. During your treatment, let your health care professional know if you have any unusual symptoms, such as difficulty breathing. This medicine may rarely cause 'gastrointestinal perforation' (holes in the stomach, intestines or colon), a serious side effect requiring  surgery to repair. This medicine should be started at least 28 days following major surgery and  the site of the surgery should be totally healed. Check with your doctor before scheduling dental work or surgery while you are receiving this treatment. Talk to your doctor if you have recently had surgery or if you have a wound that has not healed. Do not become pregnant while taking this medicine. Women should inform their doctor if they wish to become pregnant or think they might be pregnant. There is a potential for serious side effects to an unborn child. Talk to your health care professional or pharmacist for more information. Do not breast-feed an infant while taking this medicine. This medicine has caused ovarian failure in some women. This medicine may interfere with the ability to have a child. You should talk to your doctor or health care professional if you are concerned about your fertility. What side effects may I notice from receiving this medicine? Side effects that you should report to your doctor or health care professional as soon as possible: -allergic reactions like skin rash, itching or hives, swelling of the face, lips, or tongue -signs of infection - fever or chills, cough, sore throat, pain or trouble passing urine -signs of decreased platelets or bleeding - bruising, pinpoint red spots on the skin, black, tarry stools, nosebleeds, blood in the urine -breathing problems -changes in vision -chest pain -confusion -jaw pain, especially after dental work -mouth sores -seizures -severe abdominal pain -severe headache -sudden numbness or weakness of the face, arm or leg -swelling of legs or ankles -symptoms of a stroke: change in mental awareness, inability to talk or move one side of the body (especially in patients with lung cancer) -trouble passing urine or change in the amount of urine -trouble speaking or understanding -trouble walking, dizziness, loss of balance or coordination Side effects that usually do not require medical attention (report to your doctor or health  care professional if they continue or are bothersome): -constipation -diarrhea -dry skin -headache -loss of appetite -nausea, vomiting This list may not describe all possible side effects. Call your doctor for medical advice about side effects. You may report side effects to FDA at 1-800-FDA-1088. Where should I keep my medicine? This drug is given in a hospital or clinic and will not be stored at home. NOTE: This sheet is a summary. It may not cover all possible information. If you have questions about this medicine, talk to your doctor, pharmacist, or health care provider.  2015, [employer] Standard. (2013-04-25 11:38:34)  Irinotecan injection What is this medicine? IRINOTECAN (ir in oh TEE kan ) is a chemotherapy drug. It is used to treat colon and rectal cancer. This medicine may be used for other purposes; ask your health care provider or pharmacist if you have questions. COMMON BRAND NAME(S): Camptosar What should I tell my health care provider before I take this medicine? They need to know if you have any of these conditions: -blood disorders -dehydration -diarrhea -infection (especially a virus infection such as chickenpox, cold sores, or herpes) -liver disease -low blood counts, like low white cell, platelet, or red cell counts -recent or ongoing radiation therapy -an unusual or allergic reaction to irinotecan, sorbitol, other chemotherapy, other medicines, foods, dyes, or preservatives -pregnant or trying to get pregnant -breast-feeding How should I use this medicine? This drug is given as an infusion into a vein. It is administered in a hospital or clinic by a specially trained health care professional. Talk  to your pediatrician regarding the use of this medicine in children. Special care may be needed. Overdosage: If you think you have taken too much of this medicine contact a poison control center or emergency room at once. NOTE: This medicine is only for you. Do  not share this medicine with others. What if I miss a dose? It is important not to miss your dose. Call your doctor or health care professional if you are unable to keep an appointment. What may interact with this medicine? Do not take this medicine with any of the following medications: -atazanavir -certain medicines for fungal infections like itraconazole and ketoconazole -St. John's Wort This medicine may also interact with the following medications: -dexamethasone -diuretics -laxatives -medicines for seizures like carbamazepine, mephobarbital, phenobarbital, phenytoin, primidone -medicines to increase blood counts like filgrastim, pegfilgrastim, sargramostim -prochlorperazine -vaccines This list may not describe all possible interactions. Give your health care provider a list of all the medicines, herbs, non-prescription drugs, or dietary supplements you use. Also tell them if you smoke, drink alcohol, or use illegal drugs. Some items may interact with your medicine. What should I watch for while using this medicine? Your condition will be monitored carefully while you are receiving this medicine. You will need important blood work done while you are taking this medicine. This drug may make you feel generally unwell. This is not uncommon, as chemotherapy can affect healthy cells as well as cancer cells. Report any side effects. Continue your course of treatment even though you feel ill unless your doctor tells you to stop. In some cases, you may be given additional medicines to help with side effects. Follow all directions for their use. You may get drowsy or dizzy. Do not drive, use machinery, or do anything that needs mental alertness until you know how this medicine affects you. Do not stand or sit up quickly, especially if you are an older patient. This reduces the risk of dizzy or fainting spells. Call your doctor or health care professional for advice if you get a fever, chills or  sore throat, or other symptoms of a cold or flu. Do not treat yourself. This drug decreases your body's ability to fight infections. Try to avoid being around people who are sick. This medicine may increase your risk to bruise or bleed. Call your doctor or health care professional if you notice any unusual bleeding. Be careful brushing and flossing your teeth or using a toothpick because you may get an infection or bleed more easily. If you have any dental work done, tell your dentist you are receiving this medicine. Avoid taking products that contain aspirin, acetaminophen, ibuprofen, naproxen, or ketoprofen unless instructed by your doctor. These medicines may hide a fever. Do not become pregnant while taking this medicine. Women should inform their doctor if they wish to become pregnant or think they might be pregnant. There is a potential for serious side effects to an unborn child. Talk to your health care professional or pharmacist for more information. Do not breast-feed an infant while taking this medicine. What side effects may I notice from receiving this medicine? Side effects that you should report to your doctor or health care professional as soon as possible: -allergic reactions like skin rash, itching or hives, swelling of the face, lips, or tongue -low blood counts - this medicine may decrease the number of white blood cells, red blood cells and platelets. You may be at increased risk for infections and bleeding. -signs of infection - fever  or chills, cough, sore throat, pain or difficulty passing urine -signs of decreased platelets or bleeding - bruising, pinpoint red spots on the skin, black, tarry stools, blood in the urine -signs of decreased red blood cells - unusually weak or tired, fainting spells, lightheadedness -breathing problems -chest pain -diarrhea -feeling faint or lightheaded, falls -flushing, runny nose, sweating during infusion -mouth sores or pain -pain, swelling,  redness or irritation where injected -pain, swelling, warmth in the leg -pain, tingling, numbness in the hands or feet -problems with balance, talking, walking -stomach cramps, pain -trouble passing urine or change in the amount of urine -vomiting as to be unable to hold down drinks or food -yellowing of the eyes or skin Side effects that usually do not require medical attention (report to your doctor or health care professional if they continue or are bothersome): -constipation -hair loss -headache -loss of appetite -nausea, vomiting -stomach upset This list may not describe all possible side effects. Call your doctor for medical advice about side effects. You may report side effects to FDA at 1-800-FDA-1088. Where should I keep my medicine? This drug is given in a hospital or clinic and will not be stored at home. NOTE: This sheet is a summary. It may not cover all possible information. If you have questions about this medicine, talk to your doctor, pharmacist, or health care provider.  2015, [employer]/Gold Standard. (2012-11-21 16:29:32)  Leucovorin injection What is this medicine? LEUCOVORIN (loo koe VOR in) is used to prevent or treat the harmful effects of some medicines. This medicine is used to treat anemia caused by a low amount of folic acid in the body. It is also used with 5-fluorouracil (5-FU) to treat colon cancer. This medicine may be used for other purposes; ask your health care provider or pharmacist if you have questions. What should I tell my health care provider before I take this medicine? They need to know if you have any of these conditions: -anemia from low levels of vitamin B-12 in the blood -an unusual or allergic reaction to leucovorin, folic acid, other medicines, foods, dyes, or preservatives -pregnant or trying to get pregnant -breast-feeding How should I use this medicine? This medicine is for injection into a muscle or into a vein. It is given by a  health care professional in a hospital or clinic setting. Talk to your pediatrician regarding the use of this medicine in children. Special care may be needed. Overdosage: If you think you have taken too much of this medicine contact a poison control center or emergency room at once. NOTE: This medicine is only for you. Do not share this medicine with others. What if I miss a dose? This does not apply. What may interact with this medicine? -capecitabine -fluorouracil -phenobarbital -phenytoin -primidone -trimethoprim-sulfamethoxazole This list may not describe all possible interactions. Give your health care provider a list of all the medicines, herbs, non-prescription drugs, or dietary supplements you use. Also tell them if you smoke, drink alcohol, or use illegal drugs. Some items may interact with your medicine. What should I watch for while using this medicine? Your condition will be monitored carefully while you are receiving this medicine. This medicine may increase the side effects of 5-fluorouracil, 5-FU. Tell your doctor or health care professional if you have diarrhea or mouth sores that do not get better or that get worse. What side effects may I notice from receiving this medicine? Side effects that you should report to your doctor or health care professional as  soon as possible: -allergic reactions like skin rash, itching or hives, swelling of the face, lips, or tongue -breathing problems -fever, infection -mouth sores -unusual bleeding or bruising -unusually weak or tired Side effects that usually do not require medical attention (report to your doctor or health care professional if they continue or are bothersome): -constipation or diarrhea -loss of appetite -nausea, vomiting This list may not describe all possible side effects. Call your doctor for medical advice about side effects. You may report side effects to FDA at 1-800-FDA-1088. Where should I keep my  medicine? This drug is given in a hospital or clinic and will not be stored at home. NOTE: This sheet is a summary. It may not cover all possible information. If you have questions about this medicine, talk to your doctor, pharmacist, or health care provider.  2015, [employer] Standard. (2007-11-29 16:50:29)  Fluorouracil, 5-FU injection What is this medicine? FLUOROURACIL, 5-FU (flure oh YOOR a sil) is a chemotherapy drug. It slows the growth of cancer cells. This medicine is used to treat many types of cancer like breast cancer, colon or rectal cancer, pancreatic cancer, and stomach cancer. This medicine may be used for other purposes; ask your health care provider or pharmacist if you have questions. COMMON BRAND NAME(S): Adrucil What should I tell my health care provider before I take this medicine? They need to know if you have any of these conditions: -blood disorders -dihydropyrimidine dehydrogenase (DPD) deficiency -infection (especially a virus infection such as chickenpox, cold sores, or herpes) -kidney disease -liver disease -malnourished, poor nutrition -recent or ongoing radiation therapy -an unusual or allergic reaction to fluorouracil, other chemotherapy, other medicines, foods, dyes, or preservatives -pregnant or trying to get pregnant -breast-feeding How should I use this medicine? This drug is given as an infusion or injection into a vein. It is administered in a hospital or clinic by a specially trained health care professional. Talk to your pediatrician regarding the use of this medicine in children. Special care may be needed. Overdosage: If you think you have taken too much of this medicine contact a poison control center or emergency room at once. NOTE: This medicine is only for you. Do not share this medicine with others. What if I miss a dose? It is important not to miss your dose. Call your doctor or health care professional if you are unable to keep an  appointment. What may interact with this medicine? -allopurinol -cimetidine -dapsone -digoxin -hydroxyurea -leucovorin -levamisole -medicines for seizures like ethotoin, fosphenytoin, phenytoin -medicines to increase blood counts like filgrastim, pegfilgrastim, sargramostim -medicines that treat or prevent blood clots like warfarin, enoxaparin, and dalteparin -methotrexate -metronidazole -pyrimethamine -some other chemotherapy drugs like busulfan, cisplatin, estramustine, vinblastine -trimethoprim -trimetrexate -vaccines Talk to your doctor or health care professional before taking any of these medicines: -acetaminophen -aspirin -ibuprofen -ketoprofen -naproxen This list may not describe all possible interactions. Give your health care provider a list of all the medicines, herbs, non-prescription drugs, or dietary supplements you use. Also tell them if you smoke, drink alcohol, or use illegal drugs. Some items may interact with your medicine. What should I watch for while using this medicine? Visit your doctor for checks on your progress. This drug may make you feel generally unwell. This is not uncommon, as chemotherapy can affect healthy cells as well as cancer cells. Report any side effects. Continue your course of treatment even though you feel ill unless your doctor tells you to stop. In some cases, you may be given  additional medicines to help with side effects. Follow all directions for their use. Call your doctor or health care professional for advice if you get a fever, chills or sore throat, or other symptoms of a cold or flu. Do not treat yourself. This drug decreases your body's ability to fight infections. Try to avoid being around people who are sick. This medicine may increase your risk to bruise or bleed. Call your doctor or health care professional if you notice any unusual bleeding. Be careful brushing and flossing your teeth or using a toothpick because you may get  an infection or bleed more easily. If you have any dental work done, tell your dentist you are receiving this medicine. Avoid taking products that contain aspirin, acetaminophen, ibuprofen, naproxen, or ketoprofen unless instructed by your doctor. These medicines may hide a fever. Do not become pregnant while taking this medicine. Women should inform their doctor if they wish to become pregnant or think they might be pregnant. There is a potential for serious side effects to an unborn child. Talk to your health care professional or pharmacist for more information. Do not breast-feed an infant while taking this medicine. Men should inform their doctor if they wish to father a child. This medicine may lower sperm counts. Do not treat diarrhea with over the counter products. Contact your doctor if you have diarrhea that lasts more than 2 days or if it is severe and watery. This medicine can make you more sensitive to the sun. Keep out of the sun. If you cannot avoid being in the sun, wear protective clothing and use sunscreen. Do not use sun lamps or tanning beds/booths. What side effects may I notice from receiving this medicine? Side effects that you should report to your doctor or health care professional as soon as possible: -allergic reactions like skin rash, itching or hives, swelling of the face, lips, or tongue -low blood counts - this medicine may decrease the number of white blood cells, red blood cells and platelets. You may be at increased risk for infections and bleeding. -signs of infection - fever or chills, cough, sore throat, pain or difficulty passing urine -signs of decreased platelets or bleeding - bruising, pinpoint red spots on the skin, black, tarry stools, blood in the urine -signs of decreased red blood cells - unusually weak or tired, fainting spells, lightheadedness -breathing problems -changes in vision -chest pain -mouth sores -nausea and vomiting -pain, swelling, redness  at site where injected -pain, tingling, numbness in the hands or feet -redness, swelling, or sores on hands or feet -stomach pain -unusual bleeding Side effects that usually do not require medical attention (report to your doctor or health care professional if they continue or are bothersome): -changes in finger or toe nails -diarrhea -dry or itchy skin -hair loss -headache -loss of appetite -sensitivity of eyes to the light -stomach upset -unusually teary eyes This list may not describe all possible side effects. Call your doctor for medical advice about side effects. You may report side effects to FDA at 1-800-FDA-1088. Where should I keep my medicine? This drug is given in a hospital or clinic and will not be stored at home. NOTE: This sheet is a summary. It may not cover all possible information. If you have questions about this medicine, talk to your doctor, pharmacist, or health care provider.  2015, [employer] Standard. (2007-09-28 13:53:16)

## 2014-08-20 NOTE — Telephone Encounter (Signed)
Gave avs & calendar for March/April. Sent message to schedule treatment. °

## 2014-08-20 NOTE — Progress Notes (Signed)
[id number] study protocol *1/*28 to receive irinotecan 227m/m2, Cycle 1 day 1. Met with patient and spouse this morning for start of protocol treatment, cycle 1 day 1, during pt visit with Dr. SBenay Spice Patient had MD ordered and research labs drawn prior to MD visit. During today's visit, I discussed with patient prohibited drugs, juices and fruits while on study and copy of Appendix B & F were provided to patient and encrouaged patient to keep list on his person for reference. Dr. SBenay Spicediscussed with patient increased risk of diarrhea with start of chemotherapy and advised patient to use OTC imodium at first onset of diarrhea. Patient was provided with information sheet by MD with directions on when and how to start imodium, should he develop diarrhea. Patient was also told to notify MD at the first start of diarrhea, patient gave verbal understanding to this. This research nurse reviewed concomitant medications and AE's with patient. At today's MD visit patient c/o increased fatigue over the past week, constipation for the past few days as well as continuous, occasional numbness and tingling that "comes and goes," in his bilateral fingers, MD aware.  Infusion protocol treatment sign given to AAnn Lions RN and BDrucie Ip RN and were informed of study protocol infusion requirements. Encouraged patient to call MD for any questions or concerns. Patient aware of next appointments and denies any questions or concerns at this time. Research labs and archival tissue [facility] LAdele Dan RN, Clinical Research 08/20/2014 12:18 PM

## 2014-08-20 NOTE — Progress Notes (Signed)
[place] OFFICE PROGRESS NOTE   Diagnosis: Colon cancer  INTERVAL HISTORY:   Mr. Babington returns as scheduled. He went Port-A-Cath placement last week. He is scheduled to begin treatment on the [facility] study today. 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  He continues to have pain associated with the abdominal mass. The pain is relieved with hydrocodone. He complains of malaise over the past week.  Objective:  Vital signs in last 24 hours:  Blood pressure 134/81, pulse 57, temperature 97.8 F (36.6 C), temperature source Oral, resp. rate 19, height $RemoveBe'5\' 11"'nWtHKxmYD$  (1.803 m), weight 251 lb 4.8 oz (113.989 kg), SpO2 97 %.  Resp: Lungs clear bilaterally Cardio: Regular rate and rhythm GI: No hepatomegaly, upper abdominal wall mass measures 8.5 cm in transverse dimension Vascular: No leg edema    Portacath/PICC-without erythema  Lab Results:  Lab Results  Component Value Date   WBC 7.1 08/20/2014   HGB 16.5 08/20/2014   HCT 47.1 08/20/2014   MCV 95.5 08/20/2014   PLT 247 08/20/2014   NEUTROABS 4.0 08/20/2014   Medications: I have reviewed the patient's current medications.  Assessment/Plan: 1. 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 Microsatellite stable; preserved expression of major and minor MMR proteins. He began treatment with FOLFOX in December 2011. He completed cycle #12 on 10/27/2010. Oxaliplatin was held with cycles number 7 through 10 and resumed with cycle #11. 2. History of neutropenia secondary to chemotherapy. 3. History of thrombocytopenia secondary chemotherapy. 4. He did not undergo a preoperative colonoscopy. He underwent a colonoscopy on 12/19/2010 with findings of a 1 cm polyp at 90 cm from the anal verge, minimal polyp at 30 cm from the anal verge and minimal diverticulosis. Colonoscopy  03/18/2012-negative. 5. 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 He began study drug on 05/13/2010. 6. Status post Port-A-Cath placement. The Port-A-Cath has been removed. 7. 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 9. Low attenuation lesion in the caudate lobe of the liver on the CT 04/11/2012-? Cyst. MRI liver on 04/20/2012 favored the caudate lobe liver lesion to represent a cyst or complex cyst. CT abdomen/pelvis 04/17/2013 with continued enlargement of hypovascular lesion in the caudate lobe of the liver.  PET scan 10/27/2011 confirmed a hypermetabolic caudate lobe liver lesion, tiny indeterminate lung nodules, and a 9 mm level II left neck node   CT biopsy of the liver lesion 11/07/2013 confirmed adenocarcinoma  Left liver and caudate resection 01/15/2014-pathology confirmed metastatic colon cancer, and negative surgical margins  Surveillance CT scans 07/24/2014 consistent with recurrent disease involving upper abdominal lymph nodes, peritoneal implants, and an anterior abdominal wall mass  Status post biopsy of the anterior abdominal wall mass 08/06/2014 with pathology showing metastatic adenocarcinoma consistent with a colon primary.  UGT1A1 1/28  Cycle 1 FOLFIRI/Avastin on the [place] 13[facility] 10. Iron deposition within the liver noted on MRI 04/20/2012. The ferritin level was normal on 04/17/2013. Increased iron deposition noted on the left liver resection 01/15/2014 11. Pain/bleeding at the anus reported when here 04/24/2013. Status post evaluation by Dr. [name] with findings of an anal fissure. 12. Right face cystic lesion 13. Status post Port-A-Cath placement 08/14/2014   Disposition:  Mr. Grow appears stable. The plan is to begin FOLFIRI/Avastin  on the [facility] study today. He understands the irinotecan has  been dose escalated based on his genotype. We again reviewed potential toxicities associated with this regimen and he agrees to proceed.  His ECOG performance status is measured at 1 today.  Mr. Wain will return for an office visit and chemotherapy in 2 weeks.  Betsy Coder, MD  08/20/2014  9:25 AM

## 2014-08-20 NOTE — Progress Notes (Signed)
Pt. C/o some lower abdominal cramping and nausea. Given IVP Atropine with relief of symptoms within about 10 minutes. Educated on use of spill kit and given home spill kit.  Reviewed discharge instructions, pump instructions and when to return on Wednesday.  Patient and his wife voiced understanding.

## 2014-08-22 ENCOUNTER — Ambulatory Visit (HOSPITAL_BASED_OUTPATIENT_CLINIC_OR_DEPARTMENT_OTHER): Payer: 59

## 2014-08-22 DIAGNOSIS — Z006 Encounter for examination for normal comparison and control in clinical research program: Secondary | ICD-10-CM

## 2014-08-22 DIAGNOSIS — C189 Malignant neoplasm of colon, unspecified: Secondary | ICD-10-CM | POA: Diagnosis not present

## 2014-08-22 MED ORDER — SODIUM CHLORIDE 0.9 % IJ SOLN
10.0000 mL | INTRAMUSCULAR | Status: DC | PRN
  Administered 2014-08-22: 10 mL
  Filled 2014-08-22: qty 10

## 2014-08-22 MED ORDER — HEPARIN SOD (PORK) LOCK FLUSH 100 UNIT/ML IV SOLN
500.0000 [IU] | Freq: Once | INTRAVENOUS | Status: AC | PRN
  Administered 2014-08-22: 500 [IU]
  Filled 2014-08-22: qty 5

## 2014-08-22 NOTE — Patient Instructions (Signed)

## 2014-08-27 ENCOUNTER — Telehealth: Payer: Self-pay | Admitting: *Deleted

## 2014-08-27 NOTE — Telephone Encounter (Signed)
Called pt to follow up after chemo 3/14. He reports nausea without emesis for several days after chemo. Denies diarrhea or mouth sores. Some fatigue, which is improving.

## 2014-08-30 ENCOUNTER — Other Ambulatory Visit: Payer: Self-pay | Admitting: Medical Oncology

## 2014-08-30 DIAGNOSIS — C189 Malignant neoplasm of colon, unspecified: Secondary | ICD-10-CM

## 2014-09-03 ENCOUNTER — Ambulatory Visit (HOSPITAL_BASED_OUTPATIENT_CLINIC_OR_DEPARTMENT_OTHER): Payer: 59 | Admitting: Oncology

## 2014-09-03 ENCOUNTER — Other Ambulatory Visit: Payer: Self-pay | Admitting: Medical Oncology

## 2014-09-03 ENCOUNTER — Other Ambulatory Visit: Payer: Self-pay | Admitting: *Deleted

## 2014-09-03 ENCOUNTER — Telehealth: Payer: Self-pay | Admitting: *Deleted

## 2014-09-03 ENCOUNTER — Telehealth: Payer: Self-pay | Admitting: Oncology

## 2014-09-03 ENCOUNTER — Other Ambulatory Visit (HOSPITAL_BASED_OUTPATIENT_CLINIC_OR_DEPARTMENT_OTHER): Payer: 59

## 2014-09-03 ENCOUNTER — Ambulatory Visit (HOSPITAL_BASED_OUTPATIENT_CLINIC_OR_DEPARTMENT_OTHER): Payer: 59

## 2014-09-03 ENCOUNTER — Encounter: Payer: Self-pay | Admitting: Medical Oncology

## 2014-09-03 VITALS — BP 143/81 | HR 60 | Temp 98.9°F | Resp 18 | Ht 71.0 in | Wt 251.1 lb

## 2014-09-03 DIAGNOSIS — C787 Secondary malignant neoplasm of liver and intrahepatic bile duct: Principal | ICD-10-CM

## 2014-09-03 DIAGNOSIS — C792 Secondary malignant neoplasm of skin: Secondary | ICD-10-CM

## 2014-09-03 DIAGNOSIS — C182 Malignant neoplasm of ascending colon: Secondary | ICD-10-CM

## 2014-09-03 DIAGNOSIS — C189 Malignant neoplasm of colon, unspecified: Secondary | ICD-10-CM

## 2014-09-03 DIAGNOSIS — Z006 Encounter for examination for normal comparison and control in clinical research program: Secondary | ICD-10-CM | POA: Diagnosis not present

## 2014-09-03 DIAGNOSIS — Z5111 Encounter for antineoplastic chemotherapy: Secondary | ICD-10-CM

## 2014-09-03 LAB — CBC WITH DIFFERENTIAL/PLATELET
BASO%: 0.2 % (ref 0.0–2.0)
Basophils Absolute: 0 10*3/uL (ref 0.0–0.1)
EOS%: 3.8 % (ref 0.0–7.0)
Eosinophils Absolute: 0.2 10*3/uL (ref 0.0–0.5)
HCT: 42.9 % (ref 38.4–49.9)
HGB: 15.1 g/dL (ref 13.0–17.1)
LYMPH%: 42 % (ref 14.0–49.0)
MCH: 33 pg (ref 27.2–33.4)
MCHC: 35.2 g/dL (ref 32.0–36.0)
MCV: 93.9 fL (ref 79.3–98.0)
MONO#: 0.6 10*3/uL (ref 0.1–0.9)
MONO%: 11.8 % (ref 0.0–14.0)
NEUT%: 42.2 % (ref 39.0–75.0)
NEUTROS ABS: 2 10*3/uL (ref 1.5–6.5)
PLATELETS: 152 10*3/uL (ref 140–400)
RBC: 4.57 10*6/uL (ref 4.20–5.82)
RDW: 12.5 % (ref 11.0–14.6)
WBC: 4.7 10*3/uL (ref 4.0–10.3)
lymph#: 2 10*3/uL (ref 0.9–3.3)

## 2014-09-03 LAB — COMPREHENSIVE METABOLIC PANEL (CC13)
ALBUMIN: 3.5 g/dL (ref 3.5–5.0)
ALK PHOS: 66 U/L (ref 40–150)
ALT: 39 U/L (ref 0–55)
AST: 25 U/L (ref 5–34)
Anion Gap: 10 mEq/L (ref 3–11)
BUN: 18.6 mg/dL (ref 7.0–26.0)
CHLORIDE: 106 meq/L (ref 98–109)
CO2: 25 mEq/L (ref 22–29)
Calcium: 8.7 mg/dL (ref 8.4–10.4)
Creatinine: 1.2 mg/dL (ref 0.7–1.3)
EGFR: 67 mL/min/{1.73_m2} — AB (ref >90)
GLUCOSE: 101 mg/dL (ref 70–140)
POTASSIUM: 4.5 meq/L (ref 3.5–5.1)
SODIUM: 141 meq/L (ref 136–145)
TOTAL PROTEIN: 6.7 g/dL (ref 6.4–8.3)
Total Bilirubin: 0.7 mg/dL (ref 0.20–1.20)

## 2014-09-03 LAB — UA PROTEIN, DIPSTICK - CHCC: PROTEIN: NEGATIVE mg/dL

## 2014-09-03 LAB — MAGNESIUM (CC13): Magnesium: 2.5 mg/dl (ref 1.5–2.5)

## 2014-09-03 MED ORDER — PALONOSETRON HCL INJECTION 0.25 MG/5ML
0.2500 mg | Freq: Once | INTRAVENOUS | Status: AC
  Administered 2014-09-03: 0.25 mg via INTRAVENOUS

## 2014-09-03 MED ORDER — ATROPINE SULFATE 1 MG/ML IJ SOLN
0.5000 mg | Freq: Once | INTRAMUSCULAR | Status: AC | PRN
  Administered 2014-09-03: 0.5 mg via INTRAVENOUS

## 2014-09-03 MED ORDER — FLUOROURACIL CHEMO INJECTION 2.5 GM/50ML
400.0000 mg/m2 | Freq: Once | INTRAVENOUS | Status: AC
Start: 2014-09-03 — End: 2014-09-03
  Administered 2014-09-03: 950 mg via INTRAVENOUS
  Filled 2014-09-03: qty 19

## 2014-09-03 MED ORDER — PALONOSETRON HCL INJECTION 0.25 MG/5ML
INTRAVENOUS | Status: AC
  Filled 2014-09-03: qty 5

## 2014-09-03 MED ORDER — SODIUM CHLORIDE 0.9 % IV SOLN
2400.0000 mg/m2 | INTRAVENOUS | Status: DC
  Administered 2014-09-03: 5700 mg via INTRAVENOUS
  Filled 2014-09-03: qty 114

## 2014-09-03 MED ORDER — ATROPINE SULFATE 1 MG/ML IJ SOLN
INTRAMUSCULAR | Status: AC
  Filled 2014-09-03: qty 1

## 2014-09-03 MED ORDER — SODIUM CHLORIDE 0.9 % IV SOLN
Freq: Once | INTRAVENOUS | Status: AC
  Administered 2014-09-03: 10:00:00 via INTRAVENOUS

## 2014-09-03 MED ORDER — IRINOTECAN HCL CHEMO INJECTION 100 MG/5ML
260.0000 mg/m2 | Freq: Once | INTRAVENOUS | Status: AC
  Administered 2014-09-03: 618 mg via INTRAVENOUS
  Filled 2014-09-03: qty 30.9

## 2014-09-03 MED ORDER — LEUCOVORIN CALCIUM INJECTION 350 MG
400.0000 mg/m2 | Freq: Once | INTRAVENOUS | Status: AC
  Administered 2014-09-03: 952 mg via INTRAVENOUS
  Filled 2014-09-03: qty 47.6

## 2014-09-03 MED ORDER — LORAZEPAM 2 MG/ML IJ SOLN
INTRAMUSCULAR | Status: AC
  Filled 2014-09-03: qty 1

## 2014-09-03 MED ORDER — DEXAMETHASONE 4 MG PO TABS: 4.0000 mg | ORAL_TABLET | Freq: Two times a day (BID) | ORAL | Status: DC

## 2014-09-03 MED ORDER — SODIUM CHLORIDE 0.9 % IV SOLN
Freq: Once | INTRAVENOUS | Status: AC
  Administered 2014-09-03: 10:00:00 via INTRAVENOUS
  Filled 2014-09-03: qty 5

## 2014-09-03 MED ORDER — SODIUM CHLORIDE 0.9 % IV SOLN
5.0000 mg/kg | Freq: Once | INTRAVENOUS | Status: AC
  Administered 2014-09-03: 575 mg via INTRAVENOUS
  Filled 2014-09-03: qty 23

## 2014-09-03 MED ORDER — LORAZEPAM 2 MG/ML IJ SOLN
0.5000 mg | Freq: Once | INTRAMUSCULAR | Status: AC
  Administered 2014-09-03: 0.5 mg via INTRAVENOUS

## 2014-09-03 NOTE — Telephone Encounter (Signed)
Per staff message and POF I have scheduled appts. Advised scheduler of appts. JMW  

## 2014-09-03 NOTE — Progress Notes (Signed)
[place] OFFICE PROGRESS NOTE   Diagnosis: Colon cancer  INTERVAL HISTORY:   Bryan Fields returns as scheduled. He completed a first cycle of FOLFIRI/Avastin on 08/20/2014. He developed nausea and diarrhea during the chemotherapy infusion. He reports one episode of diarrhea last week. Mild bleeding when he blows his nose. He noted increased tingling in the hands following chemotherapy. Good appetite. He had nausea for one week following chemotherapy, no emesis. He took Compazine for the nausea. The abdominal pain has resolved. He noted a decrease in the abdominal wall mass within one hour of beginning chemotherapy.  Objective:  Vital signs in last 24 hours:  Blood pressure 143/81, pulse 60, temperature 98.9 F (37.2 C), temperature source Oral, resp. rate 18, height $RemoveBe'5\' 11"'sRYzuwMHd$  (1.803 m), weight 251 lb 1.6 oz (113.898 kg), SpO2 100 %.    HEENT: No thrush or ulcers Resp: Lungs clear bilaterally Cardio: Regular rate and rhythm GI: No hepatomegaly, mid upper abdominal wall mass measures 7 cm in transverse dimension Vascular: No leg edema  Skin: No rash   Portacath/PICC-without erythema  Lab Results:  Lab Results  Component Value Date   WBC 4.7 09/03/2014   HGB 15.1 09/03/2014   HCT 42.9 09/03/2014   MCV 93.9 09/03/2014   PLT 152 09/03/2014   NEUTROABS 2.0 09/03/2014      Lab Results  Component Value Date   CEA 3.1 08/10/2014    Imaging:  No results found.  Medications: I have reviewed the patient's current medications.  Assessment/Plan: 1. 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 Microsatellite stable; preserved expression of major and minor MMR proteins. He began treatment with FOLFOX in December 2011. He completed cycle #12 on 10/27/2010. Oxaliplatin was held with cycles number 7 through 10 and resumed with cycle #11. 2. History of  neutropenia secondary to chemotherapy. 3. History of thrombocytopenia secondary chemotherapy. 4. He did not undergo a preoperative colonoscopy. He underwent a colonoscopy on 12/19/2010 with findings of a 1 cm polyp at 90 cm from the anal verge, minimal polyp at 30 cm from the anal verge and minimal diverticulosis. Colonoscopy 03/18/2012-negative. 5. 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 He began study drug on 05/13/2010. 6. Status post Port-A-Cath placement. The Port-A-Cath has been removed. 7. 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 9. Low attenuation lesion in the caudate lobe of the liver on the CT 04/11/2012-? Cyst. MRI liver on 04/20/2012 favored the caudate lobe liver lesion to represent a cyst or complex cyst. CT abdomen/pelvis 04/17/2013 with continued enlargement of hypovascular lesion in the caudate lobe of the liver.  PET scan 10/27/2011 confirmed a hypermetabolic caudate lobe liver lesion, tiny indeterminate lung nodules, and a 9 mm level II left neck node   CT biopsy of the liver lesion 11/07/2013 confirmed adenocarcinoma  Left liver and caudate resection 01/15/2014-pathology confirmed metastatic colon cancer, and negative surgical margins  Surveillance CT scans 07/24/2014 consistent with recurrent disease involving upper abdominal lymph nodes, peritoneal implants, and an anterior abdominal wall mass  Status post biopsy of the anterior abdominal wall mass 08/06/2014 with pathology showing metastatic adenocarcinoma consistent with a colon primary.  UGT1A1 1/28  Cycle 1 FOLFIRI/Avastin on the [facility] 1317 08/20/2014  Cycle 2 FOLFIRI/Avastin 09/03/2014 10. Iron deposition within the liver noted on MRI  04/20/2012. The ferritin level was normal on 04/17/2013. Increased iron deposition noted on the left liver  resection 01/15/2014 11. Pain/bleeding at the anus reported when here 04/24/2013. Status post evaluation by Dr. [name] with findings of an anal fissure. 12. Right face cystic lesion 13. Status post Port-A-Cath placement 08/14/2014 14. Delayed nausea following FOLFIRI, prophylactic Decadron added with cycle 2   Disposition:  Mr. Ludvigsen tolerated the first cycle of FOLFIRI/Avastin well other than delayed nausea. We will and prophylactic Decadron with cycle 2. He will contact us for nausea despite the Decadron. The abdominal wall pain has resolved and the mass appears smaller  The plan is to proceed with cycle 2 FOLFIRI/Avastin today. He will return for an office visit and chemotherapy in 2 weeks. His ECOG performance status is measured at 1 today.  Betsy Coder, MD  09/03/2014  9:23 AM

## 2014-09-03 NOTE — Progress Notes (Signed)
[place] [facility]: Cycle 1 Day 15 Met patient and wife today for MD office visit, for Cycle 1 Day 15, in exam room with MD. Patient reports having "very bad" nausea that started during his chemotherapy infusion and lasted for 7 days, ending on Sunday the 20th. Patient reports just having nausea without emesis for those 7 days following treatment. Patient states he had some mild diarrhea and cramping the first day of treatment and was given atropine in the infusion room and symptoms stopped with no other issues. Patient denies pain and reports to have stopped taking Nor co and Percocet on March 14th. Patient also reports that he started to notice some hair loss this morning. Patient continues with neuropathy and states at times tingling feels sharper and states this started the following day of treatment, does report that tingling comes and goes and does not interfere with his daily activities. Other symptoms reported by patient: a one time "mild nose bleed" when he blew his nose (Wednesday 16th) that he attributes to dry air; small amount of rectal bleeding from "mild tear" present at baseline. Dr. Benay Spice prescribed decadron to help with nausea and patient to start that the day after chemotherapy, twice a day for three days. Caliper measurement of external upper abdominal wall mass, by Dr. Benay Spice, today is 7 cm transversely and patient reports to have felt "it decreasing in size during the first infusion." Patient's health status and labs within treatment parameters, patient to receive Day 15 treatment today. Patient and wife's question answered, he denies further questions. Encouraged patient and spouse to call clinic with any questions/concerns. Infusion sign and report given to Drucie Ip, RN. Patient scheduled for [facility] pump D/C on the 30th and  Cycle 2 Day 1 with labs and MD appointment for April 11th.

## 2014-09-03 NOTE — Telephone Encounter (Signed)
Gave avs & calendar for April. Sent message to schedule treatment. °

## 2014-09-03 NOTE — Patient Instructions (Signed)
[place] Discharge Instructions for Patients Receiving Chemotherapy  Today you received the following chemotherapy agents: Avastin, Camptosar, Leucovorin, 5FU  To help prevent nausea and vomiting after your treatment, we encourage you to take your nausea medication: Decadron as directed, Compazine 10 mg every 6 hours as needed.   If you develop nausea and vomiting that is not controlled by your nausea medication, call the clinic.   BELOW ARE SYMPTOMS THAT SHOULD BE REPORTED IMMEDIATELY:  *FEVER GREATER THAN 100.5 F  *CHILLS WITH OR WITHOUT FEVER  NAUSEA AND VOMITING THAT IS NOT CONTROLLED WITH YOUR NAUSEA MEDICATION  *UNUSUAL SHORTNESS OF BREATH  *UNUSUAL BRUISING OR BLEEDING  TENDERNESS IN MOUTH AND THROAT WITH OR WITHOUT PRESENCE OF ULCERS  *URINARY PROBLEMS  *BOWEL PROBLEMS  UNUSUAL RASH Items with * indicate a potential emergency and should be followed up as soon as possible.  Feel free to call the clinic you have any questions or concerns. The clinic phone number is [phone].  Please show the Bryan Fields at check-in to the Emergency Department and triage nurse.

## 2014-09-05 ENCOUNTER — Ambulatory Visit (HOSPITAL_BASED_OUTPATIENT_CLINIC_OR_DEPARTMENT_OTHER): Payer: 59

## 2014-09-05 DIAGNOSIS — C189 Malignant neoplasm of colon, unspecified: Secondary | ICD-10-CM | POA: Diagnosis not present

## 2014-09-05 DIAGNOSIS — Z006 Encounter for examination for normal comparison and control in clinical research program: Secondary | ICD-10-CM | POA: Diagnosis not present

## 2014-09-05 MED ORDER — SODIUM CHLORIDE 0.9 % IJ SOLN
10.0000 mL | INTRAMUSCULAR | Status: DC | PRN
  Administered 2014-09-05: 10 mL
  Filled 2014-09-05: qty 10

## 2014-09-05 MED ORDER — HEPARIN SOD (PORK) LOCK FLUSH 100 UNIT/ML IV SOLN
500.0000 [IU] | Freq: Once | INTRAVENOUS | Status: AC | PRN
  Administered 2014-09-05: 500 [IU]
  Filled 2014-09-05: qty 5

## 2014-09-14 ENCOUNTER — Other Ambulatory Visit: Payer: Self-pay | Admitting: Nurse Practitioner

## 2014-09-16 ENCOUNTER — Other Ambulatory Visit: Payer: Self-pay | Admitting: Oncology

## 2014-09-17 ENCOUNTER — Ambulatory Visit (HOSPITAL_BASED_OUTPATIENT_CLINIC_OR_DEPARTMENT_OTHER): Payer: 59 | Admitting: Oncology

## 2014-09-17 ENCOUNTER — Other Ambulatory Visit: Payer: Self-pay | Admitting: *Deleted

## 2014-09-17 ENCOUNTER — Encounter: Payer: Self-pay | Admitting: Medical Oncology

## 2014-09-17 ENCOUNTER — Telehealth: Payer: Self-pay | Admitting: Oncology

## 2014-09-17 ENCOUNTER — Encounter: Payer: Self-pay | Admitting: Oncology

## 2014-09-17 ENCOUNTER — Telehealth: Payer: Self-pay | Admitting: *Deleted

## 2014-09-17 ENCOUNTER — Ambulatory Visit: Payer: 59

## 2014-09-17 ENCOUNTER — Other Ambulatory Visit: Payer: Self-pay | Admitting: Oncology

## 2014-09-17 ENCOUNTER — Other Ambulatory Visit (HOSPITAL_BASED_OUTPATIENT_CLINIC_OR_DEPARTMENT_OTHER): Payer: 59

## 2014-09-17 ENCOUNTER — Ambulatory Visit: Payer: 59 | Admitting: Oncology

## 2014-09-17 VITALS — BP 140/64 | HR 55 | Temp 97.9°F | Resp 18 | Ht 71.0 in | Wt 250.1 lb

## 2014-09-17 DIAGNOSIS — Z006 Encounter for examination for normal comparison and control in clinical research program: Secondary | ICD-10-CM | POA: Diagnosis not present

## 2014-09-17 DIAGNOSIS — C7989 Secondary malignant neoplasm of other specified sites: Secondary | ICD-10-CM

## 2014-09-17 DIAGNOSIS — D701 Agranulocytosis secondary to cancer chemotherapy: Secondary | ICD-10-CM | POA: Diagnosis not present

## 2014-09-17 DIAGNOSIS — C182 Malignant neoplasm of ascending colon: Secondary | ICD-10-CM | POA: Diagnosis not present

## 2014-09-17 DIAGNOSIS — C787 Secondary malignant neoplasm of liver and intrahepatic bile duct: Secondary | ICD-10-CM | POA: Diagnosis not present

## 2014-09-17 DIAGNOSIS — C189 Malignant neoplasm of colon, unspecified: Secondary | ICD-10-CM

## 2014-09-17 DIAGNOSIS — D709 Neutropenia, unspecified: Secondary | ICD-10-CM

## 2014-09-17 LAB — COMPREHENSIVE METABOLIC PANEL (CC13)
ALK PHOS: 61 U/L (ref 40–150)
ALT: 33 U/L (ref 0–55)
ANION GAP: 9 meq/L (ref 3–11)
AST: 21 U/L (ref 5–34)
Albumin: 3.4 g/dL — ABNORMAL LOW (ref 3.5–5.0)
BUN: 19.4 mg/dL (ref 7.0–26.0)
CALCIUM: 9 mg/dL (ref 8.4–10.4)
CO2: 28 mEq/L (ref 22–29)
Chloride: 103 mEq/L (ref 98–109)
Creatinine: 1.3 mg/dL (ref 0.7–1.3)
EGFR: 64 mL/min/{1.73_m2} — AB (ref >90)
GLUCOSE: 117 mg/dL (ref 70–140)
POTASSIUM: 4.7 meq/L (ref 3.5–5.1)
Sodium: 140 mEq/L (ref 136–145)
Total Bilirubin: 0.54 mg/dL (ref 0.20–1.20)
Total Protein: 6.7 g/dL (ref 6.4–8.3)

## 2014-09-17 LAB — UA PROTEIN, DIPSTICK - CHCC: Protein, ur: NEGATIVE mg/dL

## 2014-09-17 LAB — CBC WITH DIFFERENTIAL/PLATELET
BASO%: 1.1 % (ref 0.0–2.0)
Basophils Absolute: 0 10*3/uL (ref 0.0–0.1)
EOS%: 1.9 % (ref 0.0–7.0)
Eosinophils Absolute: 0.1 10*3/uL (ref 0.0–0.5)
HEMATOCRIT: 44.2 % (ref 38.4–49.9)
HGB: 14.9 g/dL (ref 13.0–17.1)
LYMPH#: 1.9 10*3/uL (ref 0.9–3.3)
LYMPH%: 58.2 % — AB (ref 14.0–49.0)
MCH: 32 pg (ref 27.2–33.4)
MCHC: 33.8 g/dL (ref 32.0–36.0)
MCV: 94.7 fL (ref 79.3–98.0)
MONO#: 0.5 10*3/uL (ref 0.1–0.9)
MONO%: 15.7 % — ABNORMAL HIGH (ref 0.0–14.0)
NEUT#: 0.8 10*3/uL — ABNORMAL LOW (ref 1.5–6.5)
NEUT%: 23.1 % — ABNORMAL LOW (ref 39.0–75.0)
Platelets: 193 10*3/uL (ref 140–400)
RBC: 4.66 10*6/uL (ref 4.20–5.82)
RDW: 12.8 % (ref 11.0–14.6)
WBC: 3.3 10*3/uL — AB (ref 4.0–10.3)

## 2014-09-17 LAB — MAGNESIUM (CC13): MAGNESIUM: 2.5 mg/dL (ref 1.5–2.5)

## 2014-09-17 NOTE — Progress Notes (Signed)
[place] OFFICE PROGRESS NOTE   Diagnosis: Colon cancer  INTERVAL HISTORY:   Mr. Pfalzgraf returns as scheduled. He completed a first cycle of FOLFIRI/Avastin on 03//28/16. His nausea was better with the last cycle with the addition of Dexamethasone. Denies diarrhea. Mild bleeding when he blows his nose. He noted increased tingling in the hands following chemotherapy. Good appetite. The abdominal pain has resolved. He thinks that the abdominal wall mass is getting smaller. Denies fevers and chills.   Objective:  Vital signs in last 24 hours:  Blood pressure 140/64, pulse 55, temperature 97.9 F (36.6 C), temperature source Oral, resp. rate 18, height _0  (1.803 m), weight 250 lb 1.6 oz (113.445 kg), SpO2 100 %.    HEENT: No thrush or ulcers Resp: Lungs clear bilaterally Cardio: Regular rate and rhythm GI: No hepatomegaly, mid upper abdominal wall mass measures 7 cm in transverse dimension, Mass is softer. Vascular: No leg edema  Skin: No rash   Portacath/PICC-without erythema  Lab Results:  Lab Results  Component Value Date   WBC 3.3* 09/17/2014   HGB 14.9 09/17/2014   HCT 44.2 09/17/2014   MCV 94.7 09/17/2014   PLT 193 09/17/2014   NEUTROABS 0.8* 09/17/2014      Lab Results  Component Value Date   CEA 3.1 08/10/2014    Imaging:  No results found.  Medications: I have reviewed the patient's current medications.  Assessment/Plan: 1. 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 Microsatellite stable; preserved expression of major and minor MMR proteins. He began treatment with FOLFOX in December 2011. He completed cycle #12 on 10/27/2010. Oxaliplatin was held with cycles number 7 through 10 and resumed with cycle #11. 2. History of neutropenia secondary to chemotherapy. 3. History of thrombocytopenia secondary chemotherapy. 4. He did not  undergo a preoperative colonoscopy. He underwent a colonoscopy on 12/19/2010 with findings of a 1 cm polyp at 90 cm from the anal verge, minimal polyp at 30 cm from the anal verge and minimal diverticulosis. Colonoscopy 03/18/2012-negative. 5. 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 He began study drug on 05/13/2010. 6. Status post Port-A-Cath placement. The Port-A-Cath has been removed. 7. 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 9. Low attenuation lesion in the caudate lobe of the liver on the CT 04/11/2012-? Cyst. MRI liver on 04/20/2012 favored the caudate lobe liver lesion to represent a cyst or complex cyst. CT abdomen/pelvis 04/17/2013 with continued enlargement of hypovascular lesion in the caudate lobe of the liver.  PET scan 10/27/2011 confirmed a hypermetabolic caudate lobe liver lesion, tiny indeterminate lung nodules, and a 9 mm level II left neck node   CT biopsy of the liver lesion 11/07/2013 confirmed adenocarcinoma  Left liver and caudate resection 01/15/2014-pathology confirmed metastatic colon cancer, and negative surgical margins  Surveillance CT scans 07/24/2014 consistent with recurrent disease involving upper abdominal lymph nodes, peritoneal implants, and an anterior abdominal wall mass  Status post biopsy of the anterior abdominal wall mass 08/06/2014 with pathology showing metastatic adenocarcinoma consistent with a colon primary.  UGT1A1 1/28  Cycle 1 FOLFIRI/Avastin on the [facility] 1317 08/20/2014  Cycle 2 FOLFIRI/Avastin 09/03/2014 10. Iron deposition within the liver noted on MRI 04/20/2012. The ferritin level was normal on 04/17/2013. Increased iron deposition noted on the left  liver resection 01/15/2014 11. Pain/bleeding at the anus reported when here 04/24/2013. Status post evaluation by  Dr. [name] with findings of an anal fissure. 12. Right face cystic lesion 13. Status post Port-A-Cath placement 08/14/2014 14. Delayed nausea following FOLFIRI, prophylactic Decadron added with cycle 2   Disposition:  Mr. Forse tolerated the first cycle of FOLFIRI/Avastin well. Nausea improved with the addition of Dexamethasone. The abdominal wall pain has resolved and the mass appears smaller. He has developed grade 3 neutropenia. Chemotherapy will be delayed by 1 week. When he resumes his chemotherapy next week, will add Neulasta on day of pump D/C.  He will return for an office visit and chemotherapy in 1 week. His ECOG performance status is measured at 1 today.  The patient was seen and examined with Dr. Benay Spice today.  Mikey Bussing, DNP, AGPCNP-BC. AOCNP  09/17/2014  11:22 AM   This was a shared visit with Altamese Dilling. Mr. Bos was interviewed and examined. The nausea was better with the adjusted antiemetic regimen with cycle 2. Chemotherapy will be held secondary to neutropenia. He will receive Neulasta with cycle 3 next week. We reviewed the potential toxicities associated with Neulasta and he agrees to proceed.  Julieanne Manson, M.D.

## 2014-09-17 NOTE — Telephone Encounter (Signed)
Per staff message and POF I have scheduled appts. Advised scheduler of appts and first available given. JMW  

## 2014-09-17 NOTE — Progress Notes (Signed)
[id number]: Cycle 2, Day 1:  Met with patient and wife in exam room after lab appt., with NP, Mikey Bussing and Dr. Benay Spice. Patient's Neutrophil's down to 0.8. Per Dr. Benay Spice and protocol, patient's treatment to be held today and delayed one week pending lab recheck in one weeks time. Patient denies pain, vomiting and diarrhea. States has had some nausea following last treatment, Cycle 1 Day 15,  And he states the prescribed decadron has helped him with this. Patient continues with neuropathy and continues to report that it does not interfere with his ADLs, patient states he has noticed feeling more fatigued by the end of the day but reports it does not interfere with his usual daily activities. Patient continues to report some mild blood tinged nasal discharge when he blows his nose and states that he has started taking an OTC [employer] brand sinus medication (04/10)  due to nasal drainage. Patient states that he has been having a scratchy throat and states this started after his first treatment, and states no change in intensity since then but it continues, denies cough. Patient also reports is having difficulty sleeping and has started (04/09) taking 5 mg of melatonin nightly to aid in sleep, as well as Aleve for occassional minor aches and pain, in which he reports not knowing exactly when he started taking, but has been taking it on and off as needed. Patient denies fever/chills/shakes. Measurement of external upper abdominal wall mass by NP K. Curcio, continues at approximately 7 cm. Dr. Benay Spice reviewed lab results with patient and due to decrease in Neutrophils to 0.8 informed patient treatment to be held today and patient to return 04/18 for labs, office visit, and treatment if labs result within treatable levels. Patient's and wife's questions answered to their satisfaction. Encouraged patient and wife to call clinic should they have any questions or concerns, fever greater than 100.5 and chills with or  without fever. Patient gave verbal understanding, denies further questions at this time and knows to return next week Monday for scheduled appts. Adele Dan, RN, Clinical Research 09/17/2014 2:14 PM

## 2014-09-17 NOTE — Telephone Encounter (Signed)
gave and printed appt sched and avs fo rpt for April adn May.....sed added tx. °

## 2014-09-18 ENCOUNTER — Encounter: Payer: Self-pay | Admitting: Medical Oncology

## 2014-09-21 ENCOUNTER — Other Ambulatory Visit: Payer: Self-pay | Admitting: Medical Oncology

## 2014-09-21 DIAGNOSIS — C189 Malignant neoplasm of colon, unspecified: Secondary | ICD-10-CM

## 2014-09-24 ENCOUNTER — Telehealth: Payer: Self-pay | Admitting: Nurse Practitioner

## 2014-09-24 ENCOUNTER — Encounter: Payer: Self-pay | Admitting: Medical Oncology

## 2014-09-24 ENCOUNTER — Ambulatory Visit: Payer: 59

## 2014-09-24 ENCOUNTER — Other Ambulatory Visit (HOSPITAL_BASED_OUTPATIENT_CLINIC_OR_DEPARTMENT_OTHER): Payer: 59

## 2014-09-24 ENCOUNTER — Ambulatory Visit (HOSPITAL_BASED_OUTPATIENT_CLINIC_OR_DEPARTMENT_OTHER): Payer: 59 | Admitting: Nurse Practitioner

## 2014-09-24 ENCOUNTER — Telehealth: Payer: Self-pay | Admitting: *Deleted

## 2014-09-24 VITALS — BP 141/75 | HR 64 | Temp 98.1°F | Resp 18 | Ht 71.0 in | Wt 251.4 lb

## 2014-09-24 DIAGNOSIS — C182 Malignant neoplasm of ascending colon: Secondary | ICD-10-CM

## 2014-09-24 DIAGNOSIS — C189 Malignant neoplasm of colon, unspecified: Secondary | ICD-10-CM

## 2014-09-24 DIAGNOSIS — C787 Secondary malignant neoplasm of liver and intrahepatic bile duct: Secondary | ICD-10-CM

## 2014-09-24 DIAGNOSIS — C7989 Secondary malignant neoplasm of other specified sites: Secondary | ICD-10-CM

## 2014-09-24 LAB — CBC WITH DIFFERENTIAL/PLATELET
BASO%: 0.3 % (ref 0.0–2.0)
BASOS ABS: 0 10*3/uL (ref 0.0–0.1)
EOS ABS: 0.2 10*3/uL (ref 0.0–0.5)
EOS%: 2.8 % (ref 0.0–7.0)
HEMATOCRIT: 41.8 % (ref 38.4–49.9)
HGB: 14.6 g/dL (ref 13.0–17.1)
LYMPH%: 38.7 % (ref 14.0–49.0)
MCH: 33.1 pg (ref 27.2–33.4)
MCHC: 34.9 g/dL (ref 32.0–36.0)
MCV: 94.8 fL (ref 79.3–98.0)
MONO#: 0.7 10*3/uL (ref 0.1–0.9)
MONO%: 11.5 % (ref 0.0–14.0)
NEUT#: 2.8 10*3/uL (ref 1.5–6.5)
NEUT%: 46.7 % (ref 39.0–75.0)
Platelets: 157 10*3/uL (ref 140–400)
RBC: 4.41 10*6/uL (ref 4.20–5.82)
RDW: 13.5 % (ref 11.0–14.6)
WBC: 6 10*3/uL (ref 4.0–10.3)
lymph#: 2.3 10*3/uL (ref 0.9–3.3)

## 2014-09-24 LAB — COMPREHENSIVE METABOLIC PANEL (CC13)
ALBUMIN: 3.7 g/dL (ref 3.5–5.0)
ALT: 33 U/L (ref 0–55)
AST: 23 U/L (ref 5–34)
Alkaline Phosphatase: 69 U/L (ref 40–150)
Anion Gap: 12 mEq/L — ABNORMAL HIGH (ref 3–11)
BUN: 26.7 mg/dL — AB (ref 7.0–26.0)
CALCIUM: 9 mg/dL (ref 8.4–10.4)
CHLORIDE: 105 meq/L (ref 98–109)
CO2: 23 meq/L (ref 22–29)
Creatinine: 1.3 mg/dL (ref 0.7–1.3)
EGFR: 63 mL/min/{1.73_m2} — ABNORMAL LOW (ref >90)
Glucose: 108 mg/dl (ref 70–140)
POTASSIUM: 4.6 meq/L (ref 3.5–5.1)
Sodium: 140 mEq/L (ref 136–145)
Total Bilirubin: 0.49 mg/dL (ref 0.20–1.20)
Total Protein: 6.9 g/dL (ref 6.4–8.3)

## 2014-09-24 LAB — UA PROTEIN, DIPSTICK - CHCC: PROTEIN: NEGATIVE mg/dL

## 2014-09-24 LAB — MAGNESIUM (CC13): Magnesium: 2.5 mg/dl (ref 1.5–2.5)

## 2014-09-24 NOTE — Progress Notes (Addendum)
[place] OFFICE PROGRESS NOTE   Diagnosis:   Colon cancer  INTERVAL HISTORY:   Bryan Fields returns as scheduled. He completed cycle 2 FOLFIRI/Avastin on 09/03/2014. Cycle 3 was held on 09/17/2014 due to neutropenia. He noted improvement in nausea following cycle 2 with the addition of dexamethasone. No mouth sores. No diarrhea. He denies bleeding. No shortness of breath or chest pain. No leg swelling or calf pain. No pain associated with the abdominal wall mass. He feels the mass overall is stable in size.  Objective:  Vital signs in last 24 hours:  Blood pressure 141/75, pulse 64, temperature 98.1 F (36.7 C), temperature source Oral, resp. rate 18, height $RemoveBe'5\' 11"'tlfjUawUd$  (1.803 m), weight 251 lb 6.4 oz (114.034 kg), SpO2 100 %.    HEENT:  No thrush or ulcers. Resp:  Lungs clear bilaterally. Cardio:  Regular rate and rhythm. GI:  Abdomen soft and nontender. No hepatomegaly. Mid upper abdominal wall mass measures approximately 7 x 7 cm. Vascular:  No leg edema. Calves nontender.  Skin:  3 cm area of erythema at the right lower abdominal wall (patient reports poison oak).    Lab Results:  Lab Results  Component Value Date   WBC 6.0 09/24/2014   HGB 14.6 09/24/2014   HCT 41.8 09/24/2014   MCV 94.8 09/24/2014   PLT 157 09/24/2014   NEUTROABS 2.8 09/24/2014    Imaging:  No results found.  Medications: I have reviewed the patient's current medications.  Assessment/Plan: 1. 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 Microsatellite stable; preserved expression of major and minor MMR proteins. He began treatment with FOLFOX in December 2011. He completed cycle #12 on 10/27/2010. Oxaliplatin was held with cycles number 7 through 10 and resumed with cycle #11. 2. History of neutropenia secondary to chemotherapy. 3. History of thrombocytopenia secondary  chemotherapy. 4. He did not undergo a preoperative colonoscopy. He underwent a colonoscopy on 12/19/2010 with findings of a 1 cm polyp at 90 cm from the anal verge, minimal polyp at 30 cm from the anal verge and minimal diverticulosis. Colonoscopy 03/18/2012-negative. 5. 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 He began study drug on 05/13/2010. 6. Status post Port-A-Cath placement. The Port-A-Cath has been removed. 7. 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 9. Low attenuation lesion in the caudate lobe of the liver on the CT 04/11/2012-? Cyst. MRI liver on 04/20/2012 favored the caudate lobe liver lesion to represent a cyst or complex cyst. CT abdomen/pelvis 04/17/2013 with continued enlargement of hypovascular lesion in the caudate lobe of the liver.  PET scan 10/27/2011 confirmed a hypermetabolic caudate lobe liver lesion, tiny indeterminate lung nodules, and a 9 mm level II left neck node   CT biopsy of the liver lesion 11/07/2013 confirmed adenocarcinoma  Left liver and caudate resection 01/15/2014-pathology confirmed metastatic colon cancer, and negative surgical margins  Surveillance CT scans 07/24/2014 consistent with recurrent disease involving upper abdominal lymph nodes, peritoneal implants, and an anterior abdominal wall mass  Status post biopsy of the anterior abdominal wall mass 08/06/2014 with pathology showing metastatic adenocarcinoma consistent with a colon primary.  UGT1A1 1/28  Cycle 1 FOLFIRI/Avastin on the [facility] 1317 08/20/2014  Cycle 2 FOLFIRI/Avastin 09/03/2014  Cycle 3 held on 09/17/2014 due to neutropenia 10. Iron deposition within  the liver noted on MRI 04/20/2012. The ferritin level was normal on 04/17/2013. Increased iron deposition noted on the left liver resection  01/15/2014 11. Pain/bleeding at the anus reported when here 04/24/2013. Status post evaluation by Dr. [name] with findings of an anal fissure. 12. Right face cystic lesion 13. Status post Port-A-Cath placement 08/14/2014 14. Delayed nausea following FOLFIRI, prophylactic Decadron added with cycle 2 with improvement.     Disposition: Bryan Fields appears stable.  He has completed 2 cycles of FOLFIRI/Avastin. Cycle 3 was held on 09/17/2014 due to neutropenia. The neutrophil count has recovered. Plan to proceed with cycle 3 FOLFIRI/Avastin as scheduled on 09/25/2014. The irinotecan and 5-fluorouracil will be dose reduced as per study guidelines. He will receive Neulasta on the day of pump discontinuation. We reviewed potential toxicities associated with Neulasta including bone pain, rash , splenic rupture.  He will return for a follow-up visit and cycle 4 FOLFIRI/Avastin on 10/08/2014. He will contact the office in the interim with any problems.  ECOG performance status is measured at 1 today.  Patient seen with Dr. Benay Spice.    Ned Card ANP/GNP-BC   09/24/2014  2:46 PM  This was a shared visit with Ned Card. Bryan Fields appears stable. The abdominal mass appears unchanged. Chemotherapy will be dose reduced with this cycle per protocol. He will receive Neulasta.  Julieanne Manson, M.D.

## 2014-09-24 NOTE — Telephone Encounter (Signed)
Gave avs & calendar for April/May. Sent message to schedule treatment. °

## 2014-09-24 NOTE — Progress Notes (Signed)
[id number]: Cycle 2, Day 1 Patient here today for follow-up with labs and office visit with Ned Card, NP. Cycle 2, Day treatment held on 04/11 due to [place] @ 0.8.  Today's labs with [place] @ 2.8. Patient with no complaints, denies new symptoms, no new medications, continues to report fatigue that does not interfere with ADL's and is relieved by rest. Per Dr. Benay Spice, external abdominal mass appears unchanged and continues to measure at 7 cm.  Patient scheduled to return tomorrow for treatment due to unavailable appointments in treatment room today. Per Dr. Benay Spice labs are good and patient released for treatment tomorrow and to receive Neulasta on Thursday (4/21) with pump d/c. Per protocol, patient to be dose modified Level (-1) due to grade 3 neutropenia, MD informed. Patient denies questions at this time, knows to call clinic with any questions/concerns and to return tomorrow for treatment of Cycle 2, Day 1, scheduled at 1:30. Adele Dan, RN, Clinical Research 09/24/2014 4:59 PM

## 2014-09-24 NOTE — Telephone Encounter (Signed)
Per staff message and POF I have scheduled appts. Advised scheduler of appts. JMW  

## 2014-09-25 ENCOUNTER — Other Ambulatory Visit: Payer: 59

## 2014-09-25 ENCOUNTER — Ambulatory Visit (HOSPITAL_BASED_OUTPATIENT_CLINIC_OR_DEPARTMENT_OTHER): Payer: 59

## 2014-09-25 ENCOUNTER — Ambulatory Visit: Payer: 59 | Admitting: Physician Assistant

## 2014-09-25 ENCOUNTER — Encounter: Payer: Self-pay | Admitting: Medical Oncology

## 2014-09-25 VITALS — BP 159/80 | HR 74 | Temp 98.0°F | Resp 19

## 2014-09-25 DIAGNOSIS — C7989 Secondary malignant neoplasm of other specified sites: Secondary | ICD-10-CM

## 2014-09-25 DIAGNOSIS — C182 Malignant neoplasm of ascending colon: Secondary | ICD-10-CM | POA: Diagnosis not present

## 2014-09-25 DIAGNOSIS — C189 Malignant neoplasm of colon, unspecified: Secondary | ICD-10-CM

## 2014-09-25 DIAGNOSIS — C787 Secondary malignant neoplasm of liver and intrahepatic bile duct: Secondary | ICD-10-CM

## 2014-09-25 DIAGNOSIS — Z5112 Encounter for antineoplastic immunotherapy: Secondary | ICD-10-CM

## 2014-09-25 DIAGNOSIS — Z006 Encounter for examination for normal comparison and control in clinical research program: Secondary | ICD-10-CM

## 2014-09-25 MED ORDER — FLUOROURACIL CHEMO INJECTION 2.5 GM/50ML
320.0000 mg/m2 | Freq: Once | INTRAVENOUS | Status: AC
  Administered 2014-09-25: 750 mg via INTRAVENOUS
  Filled 2014-09-25: qty 15

## 2014-09-25 MED ORDER — LEUCOVORIN CALCIUM INJECTION 350 MG
320.0000 mg/m2 | Freq: Once | INTRAMUSCULAR | Status: AC
  Administered 2014-09-25: 762 mg via INTRAVENOUS
  Filled 2014-09-25: qty 38.1

## 2014-09-25 MED ORDER — SODIUM CHLORIDE 0.9 % IV SOLN
5.0000 mg/kg | Freq: Once | INTRAVENOUS | Status: AC
  Administered 2014-09-25: 575 mg via INTRAVENOUS
  Filled 2014-09-25: qty 23

## 2014-09-25 MED ORDER — SODIUM CHLORIDE 0.9 % IV SOLN
Freq: Once | INTRAVENOUS | Status: AC
  Administered 2014-09-25: 14:00:00 via INTRAVENOUS

## 2014-09-25 MED ORDER — SODIUM CHLORIDE 0.9 % IV SOLN
2000.0000 mg/m2 | INTRAVENOUS | Status: DC
  Administered 2014-09-25: 4750 mg via INTRAVENOUS
  Filled 2014-09-25: qty 95

## 2014-09-25 MED ORDER — PALONOSETRON HCL INJECTION 0.25 MG/5ML
0.2500 mg | Freq: Once | INTRAVENOUS | Status: AC
  Administered 2014-09-25: 0.25 mg via INTRAVENOUS

## 2014-09-25 MED ORDER — PALONOSETRON HCL INJECTION 0.25 MG/5ML
INTRAVENOUS | Status: AC
  Filled 2014-09-25: qty 5

## 2014-09-25 MED ORDER — ATROPINE SULFATE 1 MG/ML IJ SOLN
0.5000 mg | Freq: Once | INTRAMUSCULAR | Status: AC | PRN
  Administered 2014-09-25: 0.5 mg via INTRAVENOUS

## 2014-09-25 MED ORDER — IRINOTECAN HCL CHEMO INJECTION 100 MG/5ML
180.0000 mg/m2 | Freq: Once | INTRAVENOUS | Status: AC
  Administered 2014-09-25: 428 mg via INTRAVENOUS
  Filled 2014-09-25: qty 21.4

## 2014-09-25 MED ORDER — ATROPINE SULFATE 1 MG/ML IJ SOLN
INTRAMUSCULAR | Status: AC
  Filled 2014-09-25: qty 1

## 2014-09-25 MED ORDER — SODIUM CHLORIDE 0.9 % IV SOLN
Freq: Once | INTRAVENOUS | Status: AC
  Administered 2014-09-25: 14:00:00 via INTRAVENOUS
  Filled 2014-09-25: qty 5

## 2014-09-25 NOTE — Patient Instructions (Signed)
[place] Discharge Instructions for Patients Receiving Chemotherapy  Today you received the following chemotherapy agents: Irinotecan, Leucovorin, Adrucil (5FU push/pump), Avastin  To help prevent nausea and vomiting after your treatment, we encourage you to take your nausea medication as prescribed by your physician.   If you develop nausea and vomiting that is not controlled by your nausea medication, call the clinic.   BELOW ARE SYMPTOMS THAT SHOULD BE REPORTED IMMEDIATELY:  *FEVER GREATER THAN 100.5 F  *CHILLS WITH OR WITHOUT FEVER  NAUSEA AND VOMITING THAT IS NOT CONTROLLED WITH YOUR NAUSEA MEDICATION  *UNUSUAL SHORTNESS OF BREATH  *UNUSUAL BRUISING OR BLEEDING  TENDERNESS IN MOUTH AND THROAT WITH OR WITHOUT PRESENCE OF ULCERS  *URINARY PROBLEMS  *BOWEL PROBLEMS  UNUSUAL RASH Items with * indicate a potential emergency and should be followed up as soon as possible.  Feel free to call the clinic you have any questions or concerns. The clinic phone number is [phone].  Please show the [place] at check-in to the Emergency Department and triage nurse.

## 2014-09-25 NOTE — Progress Notes (Signed)
[id number]: Cycle 2, Day 1: Treatment Met patient and daugther in treatment room for day 1 of cycle 2. Explained to patient dose reduction and reason. Patient verbalized understanding. Patient denies questions or any new issues at this time. I encouraged him to call me or Dr. Sherrill with any questions or concerns. Infusion treatment sign given to Alexis Robinson RN. Patient to return Thursday, April 21, for pump d/c and Neulasta injection. Leonetti, Mirjana, RN, Clinical Research 09/25/2014 1:35 PM 

## 2014-09-27 ENCOUNTER — Ambulatory Visit (HOSPITAL_BASED_OUTPATIENT_CLINIC_OR_DEPARTMENT_OTHER): Payer: 59

## 2014-09-27 ENCOUNTER — Ambulatory Visit: Payer: 59

## 2014-09-27 VITALS — BP 134/76 | HR 66 | Temp 97.8°F | Resp 18

## 2014-09-27 DIAGNOSIS — C182 Malignant neoplasm of ascending colon: Secondary | ICD-10-CM | POA: Diagnosis not present

## 2014-09-27 DIAGNOSIS — Z006 Encounter for examination for normal comparison and control in clinical research program: Secondary | ICD-10-CM

## 2014-09-27 DIAGNOSIS — C189 Malignant neoplasm of colon, unspecified: Secondary | ICD-10-CM

## 2014-09-27 DIAGNOSIS — Z5189 Encounter for other specified aftercare: Secondary | ICD-10-CM

## 2014-09-27 MED ORDER — SODIUM CHLORIDE 0.9 % IJ SOLN
10.0000 mL | INTRAMUSCULAR | Status: DC | PRN
  Administered 2014-09-27: 10 mL
  Filled 2014-09-27: qty 10

## 2014-09-27 MED ORDER — PEGFILGRASTIM INJECTION 6 MG/0.6ML ~~LOC~~
6.0000 mg | PREFILLED_SYRINGE | Freq: Once | SUBCUTANEOUS | Status: AC
  Administered 2014-09-27: 6 mg via SUBCUTANEOUS
  Filled 2014-09-27: qty 0.6

## 2014-09-27 MED ORDER — HEPARIN SOD (PORK) LOCK FLUSH 100 UNIT/ML IV SOLN
500.0000 [IU] | Freq: Once | INTRAVENOUS | Status: AC | PRN
  Administered 2014-09-27: 500 [IU]
  Filled 2014-09-27: qty 5

## 2014-09-27 NOTE — Patient Instructions (Signed)
Pegfilgrastim injection What is this medicine? PEGFILGRASTIM (peg fil GRA stim) is a long-acting granulocyte colony-stimulating factor that stimulates the growth of neutrophils, a type of white blood cell important in the body's fight against infection. It is used to reduce the incidence of fever and infection in patients with certain types of cancer who are receiving chemotherapy that affects the bone marrow. This medicine may be used for other purposes; ask your health care provider or pharmacist if you have questions. COMMON BRAND NAME(S): Neulasta What should I tell my health care provider before I take this medicine? They need to know if you have any of these conditions: -latex allergy -ongoing radiation therapy -sickle cell disease -skin reactions to acrylic adhesives (On-Body Injector only) -an unusual or allergic reaction to pegfilgrastim, filgrastim, other medicines, foods, dyes, or preservatives -pregnant or trying to get pregnant -breast-feeding How should I use this medicine? This medicine is for injection under the skin. If you get this medicine at home, you will be taught how to prepare and give the pre-filled syringe or how to use the On-body Injector. Refer to the patient Instructions for Use for detailed instructions. Use exactly as directed. Take your medicine at regular intervals. Do not take your medicine more often than directed. It is important that you put your used needles and syringes in a special sharps container. Do not put them in a trash can. If you do not have a sharps container, call your pharmacist or healthcare provider to get one. Talk to your pediatrician regarding the use of this medicine in children. Special care may be needed. Overdosage: If you think you have taken too much of this medicine contact a poison control center or emergency room at once. NOTE: This medicine is only for you. Do not share this medicine with others. What if I miss a dose? It is  important not to miss your dose. Call your doctor or health care professional if you miss your dose. If you miss a dose due to an On-body Injector failure or leakage, a new dose should be administered as soon as possible using a single prefilled syringe for manual use. What may interact with this medicine? Interactions have not been studied. Give your health care provider a list of all the medicines, herbs, non-prescription drugs, or dietary supplements you use. Also tell them if you smoke, drink alcohol, or use illegal drugs. Some items may interact with your medicine. This list may not describe all possible interactions. Give your health care provider a list of all the medicines, herbs, non-prescription drugs, or dietary supplements you use. Also tell them if you smoke, drink alcohol, or use illegal drugs. Some items may interact with your medicine. What should I watch for while using this medicine? You may need blood work done while you are taking this medicine. If you are going to need a MRI, CT scan, or other procedure, tell your doctor that you are using this medicine (On-Body Injector only). What side effects may I notice from receiving this medicine? Side effects that you should report to your doctor or health care professional as soon as possible: -allergic reactions like skin rash, itching or hives, swelling of the face, lips, or tongue -dizziness -fever -pain, redness, or irritation at site where injected -pinpoint red spots on the skin -shortness of breath or breathing problems -stomach or side pain, or pain at the shoulder -swelling -tiredness -trouble passing urine Side effects that usually do not require medical attention (report to your doctor   or health care professional if they continue or are bothersome): -bone pain -muscle pain This list may not describe all possible side effects. Call your doctor for medical advice about side effects. You may report side effects to FDA at  1-800-FDA-1088. Where should I keep my medicine? Keep out of the reach of children. Store pre-filled syringes in a refrigerator between 2 and 8 degrees C (36 and 46 degrees F). Do not freeze. Keep in carton to protect from light. Throw away this medicine if it is left out of the refrigerator for more than 48 hours. Throw away any unused medicine after the expiration date. NOTE: This sheet is a summary. It may not cover all possible information. If you have questions about this medicine, talk to your doctor, pharmacist, or health care provider.  2015, Elsevier/Gold Standard. (2013-08-24 16:14:05)  

## 2014-09-27 NOTE — Progress Notes (Signed)
Neulasta injection given by flush nurse 

## 2014-10-01 ENCOUNTER — Ambulatory Visit: Payer: 59 | Admitting: Nurse Practitioner

## 2014-10-01 ENCOUNTER — Other Ambulatory Visit: Payer: 59

## 2014-10-01 ENCOUNTER — Ambulatory Visit: Payer: 59

## 2014-10-05 ENCOUNTER — Other Ambulatory Visit: Payer: Self-pay | Admitting: Medical Oncology

## 2014-10-05 DIAGNOSIS — C189 Malignant neoplasm of colon, unspecified: Secondary | ICD-10-CM

## 2014-10-08 ENCOUNTER — Ambulatory Visit (HOSPITAL_BASED_OUTPATIENT_CLINIC_OR_DEPARTMENT_OTHER): Payer: 59 | Admitting: Nurse Practitioner

## 2014-10-08 ENCOUNTER — Other Ambulatory Visit (HOSPITAL_BASED_OUTPATIENT_CLINIC_OR_DEPARTMENT_OTHER): Payer: 59

## 2014-10-08 ENCOUNTER — Encounter: Payer: Self-pay | Admitting: Medical Oncology

## 2014-10-08 ENCOUNTER — Telehealth: Payer: Self-pay | Admitting: Nurse Practitioner

## 2014-10-08 ENCOUNTER — Other Ambulatory Visit: Payer: 59

## 2014-10-08 ENCOUNTER — Ambulatory Visit (HOSPITAL_BASED_OUTPATIENT_CLINIC_OR_DEPARTMENT_OTHER): Payer: 59

## 2014-10-08 VITALS — BP 144/86 | HR 66 | Resp 18

## 2014-10-08 VITALS — BP 142/78 | HR 58 | Temp 98.1°F | Resp 17 | Ht 71.0 in | Wt 250.1 lb

## 2014-10-08 DIAGNOSIS — C182 Malignant neoplasm of ascending colon: Secondary | ICD-10-CM

## 2014-10-08 DIAGNOSIS — Z5111 Encounter for antineoplastic chemotherapy: Secondary | ICD-10-CM

## 2014-10-08 DIAGNOSIS — C7989 Secondary malignant neoplasm of other specified sites: Secondary | ICD-10-CM

## 2014-10-08 DIAGNOSIS — C787 Secondary malignant neoplasm of liver and intrahepatic bile duct: Secondary | ICD-10-CM

## 2014-10-08 DIAGNOSIS — Z006 Encounter for examination for normal comparison and control in clinical research program: Secondary | ICD-10-CM

## 2014-10-08 DIAGNOSIS — R222 Localized swelling, mass and lump, trunk: Secondary | ICD-10-CM

## 2014-10-08 DIAGNOSIS — C189 Malignant neoplasm of colon, unspecified: Secondary | ICD-10-CM

## 2014-10-08 LAB — COMPREHENSIVE METABOLIC PANEL (CC13)
ALBUMIN: 3.4 g/dL — AB (ref 3.5–5.0)
ALK PHOS: 80 U/L (ref 40–150)
ALT: 36 U/L (ref 0–55)
ANION GAP: 10 meq/L (ref 3–11)
AST: 22 U/L (ref 5–34)
BUN: 18.5 mg/dL (ref 7.0–26.0)
CALCIUM: 8.5 mg/dL (ref 8.4–10.4)
CO2: 26 meq/L (ref 22–29)
CREATININE: 1.2 mg/dL (ref 0.7–1.3)
Chloride: 106 mEq/L (ref 98–109)
EGFR: 71 mL/min/{1.73_m2} — ABNORMAL LOW (ref >90)
GLUCOSE: 107 mg/dL (ref 70–140)
POTASSIUM: 4.3 meq/L (ref 3.5–5.1)
SODIUM: 141 meq/L (ref 136–145)
Total Bilirubin: 0.51 mg/dL (ref 0.20–1.20)
Total Protein: 6.3 g/dL — ABNORMAL LOW (ref 6.4–8.3)

## 2014-10-08 LAB — CBC WITH DIFFERENTIAL/PLATELET
BASO%: 0.9 % (ref 0.0–2.0)
Basophils Absolute: 0.1 10*3/uL (ref 0.0–0.1)
EOS%: 1.5 % (ref 0.0–7.0)
Eosinophils Absolute: 0.2 10*3/uL (ref 0.0–0.5)
HCT: 42.1 % (ref 38.4–49.9)
HGB: 14.3 g/dL (ref 13.0–17.1)
LYMPH#: 2.6 10*3/uL (ref 0.9–3.3)
LYMPH%: 24.6 % (ref 14.0–49.0)
MCH: 32.8 pg (ref 27.2–33.4)
MCHC: 34.1 g/dL (ref 32.0–36.0)
MCV: 96.4 fL (ref 79.3–98.0)
MONO#: 0.6 10*3/uL (ref 0.1–0.9)
MONO%: 5.9 % (ref 0.0–14.0)
NEUT#: 7.1 10*3/uL — ABNORMAL HIGH (ref 1.5–6.5)
NEUT%: 67.1 % (ref 39.0–75.0)
PLATELETS: 178 10*3/uL (ref 140–400)
RBC: 4.37 10*6/uL (ref 4.20–5.82)
RDW: 14.2 % (ref 11.0–14.6)
WBC: 10.6 10*3/uL — AB (ref 4.0–10.3)

## 2014-10-08 LAB — UA PROTEIN, DIPSTICK - CHCC: Protein, ur: NEGATIVE mg/dL

## 2014-10-08 LAB — MAGNESIUM (CC13): MAGNESIUM: 2.9 mg/dL — AB (ref 1.5–2.5)

## 2014-10-08 MED ORDER — ATROPINE SULFATE 1 MG/ML IJ SOLN
INTRAMUSCULAR | Status: AC
  Filled 2014-10-08: qty 1

## 2014-10-08 MED ORDER — ATROPINE SULFATE 1 MG/ML IJ SOLN
0.5000 mg | Freq: Once | INTRAMUSCULAR | Status: AC | PRN
  Administered 2014-10-08: 0.5 mg via INTRAVENOUS

## 2014-10-08 MED ORDER — SODIUM CHLORIDE 0.9 % IV SOLN
2000.0000 mg/m2 | INTRAVENOUS | Status: DC
  Administered 2014-10-08: 4750 mg via INTRAVENOUS
  Filled 2014-10-08: qty 95

## 2014-10-08 MED ORDER — DEXTROSE 5 % IV SOLN
320.0000 mg/m2 | Freq: Once | INTRAVENOUS | Status: AC
  Administered 2014-10-08: 762 mg via INTRAVENOUS
  Filled 2014-10-08: qty 38.1

## 2014-10-08 MED ORDER — SODIUM CHLORIDE 0.9 % IV SOLN
Freq: Once | INTRAVENOUS | Status: AC
  Administered 2014-10-08: 12:00:00 via INTRAVENOUS
  Filled 2014-10-08: qty 5

## 2014-10-08 MED ORDER — SODIUM CHLORIDE 0.9 % IV SOLN
Freq: Once | INTRAVENOUS | Status: AC
  Administered 2014-10-08: 12:00:00 via INTRAVENOUS

## 2014-10-08 MED ORDER — IRINOTECAN HCL CHEMO INJECTION 100 MG/5ML
180.0000 mg/m2 | Freq: Once | INTRAVENOUS | Status: AC
  Administered 2014-10-08: 428 mg via INTRAVENOUS
  Filled 2014-10-08: qty 21.4

## 2014-10-08 MED ORDER — PALONOSETRON HCL INJECTION 0.25 MG/5ML
0.2500 mg | Freq: Once | INTRAVENOUS | Status: AC
  Administered 2014-10-08: 0.25 mg via INTRAVENOUS

## 2014-10-08 MED ORDER — SODIUM CHLORIDE 0.9 % IV SOLN
5.0000 mg/kg | Freq: Once | INTRAVENOUS | Status: AC
  Administered 2014-10-08: 575 mg via INTRAVENOUS
  Filled 2014-10-08: qty 23

## 2014-10-08 MED ORDER — FLUOROURACIL CHEMO INJECTION 2.5 GM/50ML
320.0000 mg/m2 | Freq: Once | INTRAVENOUS | Status: AC
  Administered 2014-10-08: 750 mg via INTRAVENOUS
  Filled 2014-10-08: qty 15

## 2014-10-08 MED ORDER — PALONOSETRON HCL INJECTION 0.25 MG/5ML
INTRAVENOUS | Status: AC
  Filled 2014-10-08: qty 5

## 2014-10-08 NOTE — Telephone Encounter (Signed)
per pof [facility]p sch scans-adv pt [facility] will call to sch-gave pt contrast

## 2014-10-08 NOTE — Progress Notes (Signed)
[place] OFFICE PROGRESS NOTE   Diagnosis:  Colon cancer  INTERVAL HISTORY:   [name] returns as scheduled. He completed cycle 3 FOLFIRI/Avastin on 09/25/2014. No significant nausea/vomiting. No mouth sores. No diarrhea. He feels the abdominal masses stable. He notes increased "numbness" at the fingertips and great toes. No hand or foot pain or redness. Toward the end of the most recent cycle of chemotherapy he reports developing "cold sweats and chills". In retrospect he thinks he was also having crampy abdominal pain. He had another occurrence of cold sweats at home. No associated fever. He had a single episode of chest pain which he thinks was reflux. He took a [employer]. He had no associated symptoms such as shortness of breath, diaphoresis, nausea and the pain did not radiate. The pain has not recurred. He denies shortness of breath. No leg swelling or calf pain. He intermittently notes blood with nose blowing. No other bleeding.  Objective:  Vital signs in last 24 hours:  Blood pressure 142/78, pulse 58, temperature 98.1 F (36.7 C), temperature source Oral, resp. rate 17, height _0  (1.803 m), weight 250 lb 1.6 oz (113.445 kg), SpO2 100 %.    HEENT: No thrush or ulcers. Resp: Lungs clear bilaterally. Cardio: Regular rate and rhythm. GI: No hepatomegaly. Mid upper abdominal wall mass measures approximately 7 x 7 cm. Vascular: No leg edema. Calves nontender.  Skin: Palms mildly dry with scattered areas of hyperpigmentation. No erythema. Port-A-Cath without erythema.    Lab Results:  Lab Results  Component Value Date   WBC 10.6* 10/08/2014   HGB 14.3 10/08/2014   HCT 42.1 10/08/2014   MCV 96.4 10/08/2014   PLT 178 10/08/2014   NEUTROABS 7.1* 10/08/2014    Imaging:  No results found.  Medications: I have reviewed the patient's current medications.  Assessment/Plan: 1. 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 Microsatellite stable; preserved expression of major and minor MMR proteins. He began treatment with FOLFOX in December 2011. He completed cycle #12 on 10/27/2010. Oxaliplatin was held with cycles number 7 through 10 and resumed with cycle #11. 2. History of neutropenia secondary to chemotherapy. 3. History of thrombocytopenia secondary chemotherapy. 4. He did not undergo a preoperative colonoscopy. He underwent a colonoscopy on 12/19/2010 with findings of a 1 cm polyp at 90 cm from the anal verge, minimal polyp at 30 cm from the anal verge and minimal diverticulosis. Colonoscopy 03/18/2012-negative. 5. 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 He began study drug on 05/13/2010. 6. Status post Port-A-Cath placement. The Port-A-Cath has been removed. 7. 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 9. Low attenuation lesion in the caudate lobe of the liver on the CT 04/11/2012-? Cyst. MRI liver on 04/20/2012 favored the caudate lobe liver lesion to represent a cyst or complex cyst. CT abdomen/pelvis 04/17/2013 with continued enlargement of hypovascular lesion in the caudate lobe of the liver.  PET scan 10/27/2011 confirmed a hypermetabolic caudate lobe liver lesion, tiny indeterminate lung nodules, and a 9 mm level II left neck node   CT biopsy of the liver lesion 11/07/2013 confirmed adenocarcinoma  Left liver and caudate resection 01/15/2014-pathology confirmed metastatic colon cancer, and negative surgical margins  Surveillance CT scans 07/24/2014 consistent with recurrent disease involving upper abdominal lymph nodes,  peritoneal implants, and an anterior abdominal wall mass  Status post biopsy of the anterior abdominal wall mass 08/06/2014 with  pathology showing metastatic adenocarcinoma consistent with a colon primary.  UGT1A1 1/28  Cycle 1 FOLFIRI/Avastin on the [facility] 1317 08/20/2014  Cycle 2 FOLFIRI/Avastin 09/03/2014  Cycle 3 held on 09/17/2014 due to neutropenia  Cycle 3 FOLFIRI/Avastin 09/25/2014. Neulasta added beginning with cycle 3.  Cycle 4 FOLFIRI/Avastin 10/08/2014 10. Iron deposition within the liver noted on MRI 04/20/2012. The ferritin level was normal on 04/17/2013. Increased iron deposition noted on the left liver resection 01/15/2014 11. Pain/bleeding at the anus reported when here 04/24/2013. Status post evaluation by Dr. [name] with findings of an anal fissure. 12. Right face cystic lesion 13. Status post Port-A-Cath placement 08/14/2014 14. Delayed nausea following FOLFIRI, prophylactic Decadron added with cycle 2 with improvement.   Disposition: [name] appears stable. He has completed 3 cycles of FOLFIRI/Avastin. Plan to proceed with cycle 4 today as scheduled. He will again receive Neulasta on the day of pump discontinuation.  Per study guidelines restaging CT scan will be scheduled prior to cycle 5.  He will return for a follow-up visit and cycle 5 in 2 weeks. He will contact the office in the interim with any problems. We specifically discussed sweats/chills, fever and chest pain. He expressed his understanding.  ECOG performance status measured at 1 today.  Plan reviewed with Dr. Benay Spice.    Ned Card ANP/GNP-BC   10/08/2014  11:16 AM

## 2014-10-08 NOTE — Progress Notes (Signed)
Per Ned Card, NP okay to proceed with treatment today with Magnesium level of 2.9.

## 2014-10-08 NOTE — Patient Instructions (Signed)
[facility] Discharge Instructions for Patients Receiving Chemotherapy  Today you received the following chemotherapy agents: Avastin, Irinotecan, Leucovorin, Adrucil   To help prevent nausea and vomiting after your treatment, we encourage you to take your nausea medication as directed.    If you develop nausea and vomiting that is not controlled by your nausea medication, call the clinic.   BELOW ARE SYMPTOMS THAT SHOULD BE REPORTED IMMEDIATELY:  *FEVER GREATER THAN 100.5 F  *CHILLS WITH OR WITHOUT FEVER  NAUSEA AND VOMITING THAT IS NOT CONTROLLED WITH YOUR NAUSEA MEDICATION  *UNUSUAL SHORTNESS OF BREATH  *UNUSUAL BRUISING OR BLEEDING  TENDERNESS IN MOUTH AND THROAT WITH OR WITHOUT PRESENCE OF ULCERS  *URINARY PROBLEMS  *BOWEL PROBLEMS  UNUSUAL RASH Items with * indicate a potential emergency and should be followed up as soon as possible.  Feel free to call the clinic you have any questions or concerns. The clinic phone number is [phone].  Please show the CHEMO ALERT CARD at check-in to the Emergency Department and triage nurse.   

## 2014-10-08 NOTE — Progress Notes (Signed)
[id number]: Cycle 2, Day 15: Met with patient and spouse in exam room today for Cycle 2, Day 15 appointment with NP, Ned Card. Patient reports to have increased tingling in Left big toe and increased "numbness" in finger tips. Per NP assessment, patient with "palms mildly dry with scattered hyperpigmentation, no erythema," with no hand or foot pain. Patient does report having episode of  "cold sweats and chills" that developed during his last treatment here and another occurrence at home that same night and denies having fever at those times. Also reports one episode of chest pain which he relates to reflux and took a prilosec, denies other symptoms related to it and has had no other reoccurrence. Denies any other symptoms during this time. Patient reports to have started OTC Mucinex for allergies and nasal drainage. Magnesium resulted today @ 2.9. Per MD, labs within parameters to treat today. Per protocol, patient's dose to be modified for this cycle as well to Level (-1). MD will re evaluate dose modification at Cycle 3 Day 1 treatment scheduled on 05/16. External abdominal mass continues to measure approximately 7 x 7 cm. Patient to have CT scan May 12th per protocol requirements. Patient's questions answered to his satisfaction, denies further questions at this time and knows to call clinic with any questions/concerns. Adele Dan, RN, Clinical Research 10/08/2014 1:51 PM

## 2014-10-10 ENCOUNTER — Ambulatory Visit (HOSPITAL_BASED_OUTPATIENT_CLINIC_OR_DEPARTMENT_OTHER): Payer: 59

## 2014-10-10 VITALS — BP 142/82 | HR 73 | Temp 97.9°F | Resp 18

## 2014-10-10 DIAGNOSIS — C182 Malignant neoplasm of ascending colon: Secondary | ICD-10-CM

## 2014-10-10 DIAGNOSIS — C189 Malignant neoplasm of colon, unspecified: Secondary | ICD-10-CM

## 2014-10-10 DIAGNOSIS — Z006 Encounter for examination for normal comparison and control in clinical research program: Secondary | ICD-10-CM

## 2014-10-10 DIAGNOSIS — Z5189 Encounter for other specified aftercare: Secondary | ICD-10-CM | POA: Diagnosis not present

## 2014-10-10 MED ORDER — HEPARIN SOD (PORK) LOCK FLUSH 100 UNIT/ML IV SOLN
500.0000 [IU] | Freq: Once | INTRAVENOUS | Status: AC | PRN
  Administered 2014-10-10: 500 [IU]
  Filled 2014-10-10: qty 5

## 2014-10-10 MED ORDER — SODIUM CHLORIDE 0.9 % IJ SOLN
10.0000 mL | INTRAMUSCULAR | Status: DC | PRN
  Administered 2014-10-10: 10 mL
  Filled 2014-10-10: qty 10

## 2014-10-10 MED ORDER — PEGFILGRASTIM INJECTION 6 MG/0.6ML ~~LOC~~
6.0000 mg | PREFILLED_SYRINGE | Freq: Once | SUBCUTANEOUS | Status: AC
  Administered 2014-10-10: 6 mg via SUBCUTANEOUS
  Filled 2014-10-10: qty 0.6

## 2014-10-10 NOTE — Patient Instructions (Signed)
Pegfilgrastim injection What is this medicine? PEGFILGRASTIM (peg fil GRA stim) is a long-acting granulocyte colony-stimulating factor that stimulates the growth of neutrophils, a type of white blood cell important in the body's fight against infection. It is used to reduce the incidence of fever and infection in patients with certain types of cancer who are receiving chemotherapy that affects the bone marrow. This medicine may be used for other purposes; ask your health care provider or pharmacist if you have questions. COMMON BRAND NAME(S): Neulasta What should I tell my health care provider before I take this medicine? They need to know if you have any of these conditions: -latex allergy -ongoing radiation therapy -sickle cell disease -skin reactions to acrylic adhesives (On-Body Injector only) -an unusual or allergic reaction to pegfilgrastim, filgrastim, other medicines, foods, dyes, or preservatives -pregnant or trying to get pregnant -breast-feeding How should I use this medicine? This medicine is for injection under the skin. If you get this medicine at home, you will be taught how to prepare and give the pre-filled syringe or how to use the On-body Injector. Refer to the patient Instructions for Use for detailed instructions. Use exactly as directed. Take your medicine at regular intervals. Do not take your medicine more often than directed. It is important that you put your used needles and syringes in a special sharps container. Do not put them in a trash can. If you do not have a sharps container, call your pharmacist or healthcare provider to get one. Talk to your pediatrician regarding the use of this medicine in children. Special care may be needed. Overdosage: If you think you have taken too much of this medicine contact a poison control center or emergency room at once. NOTE: This medicine is only for you. Do not share this medicine with others. What if I miss a dose? It is  important not to miss your dose. Call your doctor or health care professional if you miss your dose. If you miss a dose due to an On-body Injector failure or leakage, a new dose should be administered as soon as possible using a single prefilled syringe for manual use. What may interact with this medicine? Interactions have not been studied. Give your health care provider a list of all the medicines, herbs, non-prescription drugs, or dietary supplements you use. Also tell them if you smoke, drink alcohol, or use illegal drugs. Some items may interact with your medicine. This list may not describe all possible interactions. Give your health care provider a list of all the medicines, herbs, non-prescription drugs, or dietary supplements you use. Also tell them if you smoke, drink alcohol, or use illegal drugs. Some items may interact with your medicine. What should I watch for while using this medicine? You may need blood work done while you are taking this medicine. If you are going to need a MRI, CT scan, or other procedure, tell your doctor that you are using this medicine (On-Body Injector only). What side effects may I notice from receiving this medicine? Side effects that you should report to your doctor or health care professional as soon as possible: -allergic reactions like skin rash, itching or hives, swelling of the face, lips, or tongue -dizziness -fever -pain, redness, or irritation at site where injected -pinpoint red spots on the skin -shortness of breath or breathing problems -stomach or side pain, or pain at the shoulder -swelling -tiredness -trouble passing urine Side effects that usually do not require medical attention (report to your doctor   or health care professional if they continue or are bothersome): -bone pain -muscle pain This list may not describe all possible side effects. Call your doctor for medical advice about side effects. You may report side effects to FDA at  1-800-FDA-1088. Where should I keep my medicine? Keep out of the reach of children. Store pre-filled syringes in a refrigerator between 2 and 8 degrees C (36 and 46 degrees F). Do not freeze. Keep in carton to protect from light. Throw away this medicine if it is left out of the refrigerator for more than 48 hours. Throw away any unused medicine after the expiration date. NOTE: This sheet is a summary. It may not cover all possible information. If you have questions about this medicine, talk to your doctor, pharmacist, or health care provider.  2015, Elsevier/Gold Standard. (2013-08-24 16:14:05)  

## 2014-10-15 ENCOUNTER — Other Ambulatory Visit: Payer: 59

## 2014-10-17 ENCOUNTER — Other Ambulatory Visit: Payer: Self-pay | Admitting: Family Medicine

## 2014-10-17 ENCOUNTER — Other Ambulatory Visit: Payer: Self-pay | Admitting: Medical Oncology

## 2014-10-17 DIAGNOSIS — C189 Malignant neoplasm of colon, unspecified: Secondary | ICD-10-CM

## 2014-10-18 ENCOUNTER — Ambulatory Visit (HOSPITAL_COMMUNITY)
Admission: RE | Admit: 2014-10-18 | Discharge: 2014-10-18 | Disposition: A | Discharge status: Home / Self Care | Payer: 59 | Source: Ambulatory Visit | Attending: Nurse Practitioner | Admitting: Nurse Practitioner

## 2014-10-18 ENCOUNTER — Encounter (HOSPITAL_COMMUNITY): Payer: Self-pay

## 2014-10-18 DIAGNOSIS — C189 Malignant neoplasm of colon, unspecified: Secondary | ICD-10-CM | POA: Insufficient documentation

## 2014-10-18 DIAGNOSIS — R222 Localized swelling, mass and lump, trunk: Secondary | ICD-10-CM

## 2014-10-18 DIAGNOSIS — J180 Bronchopneumonia, unspecified organism: Secondary | ICD-10-CM | POA: Diagnosis not present

## 2014-10-18 MED ORDER — IOHEXOL 300 MG/ML  SOLN
100.0000 mL | Freq: Once | INTRAMUSCULAR | Status: AC | PRN
  Administered 2014-10-18: 100 mL via INTRAVENOUS

## 2014-10-19 ENCOUNTER — Telehealth: Payer: Self-pay | Admitting: *Deleted

## 2014-10-19 MED ORDER — LEVOFLOXACIN 500 MG PO TABS: 500.0000 mg | ORAL_TABLET | Freq: Every day | ORAL | Status: DC

## 2014-10-19 NOTE — Telephone Encounter (Signed)
-----   Message from Ladell Pier, MD sent at 10/19/2014  4:06 PM EDT ----- Please e-scribe levaquin 500mg  daily for 7 days

## 2014-10-19 NOTE — Telephone Encounter (Signed)
Dr. Benay Spice called pt with CT results. Scan shows pneumonia. Levaquin Rx sent electronically.

## 2014-10-19 NOTE — Telephone Encounter (Signed)
VM message received from [facility] representative [facility]) regarding total dose of Avastin for 08/20/14. VM left with Shawn with dosage of 575 mg

## 2014-10-21 ENCOUNTER — Other Ambulatory Visit: Payer: Self-pay | Admitting: Oncology

## 2014-10-22 ENCOUNTER — Encounter: Payer: Self-pay | Admitting: Medical Oncology

## 2014-10-22 ENCOUNTER — Other Ambulatory Visit (HOSPITAL_BASED_OUTPATIENT_CLINIC_OR_DEPARTMENT_OTHER): Payer: 59

## 2014-10-22 ENCOUNTER — Telehealth: Payer: Self-pay

## 2014-10-22 ENCOUNTER — Telehealth: Payer: Self-pay | Admitting: Oncology

## 2014-10-22 ENCOUNTER — Ambulatory Visit (HOSPITAL_BASED_OUTPATIENT_CLINIC_OR_DEPARTMENT_OTHER): Payer: 59

## 2014-10-22 ENCOUNTER — Ambulatory Visit (HOSPITAL_BASED_OUTPATIENT_CLINIC_OR_DEPARTMENT_OTHER): Payer: 59 | Admitting: Nurse Practitioner

## 2014-10-22 VITALS — BP 142/84 | HR 69 | Temp 97.9°F | Resp 19 | Ht 71.0 in | Wt 251.4 lb

## 2014-10-22 DIAGNOSIS — C182 Malignant neoplasm of ascending colon: Secondary | ICD-10-CM

## 2014-10-22 DIAGNOSIS — Z5112 Encounter for antineoplastic immunotherapy: Secondary | ICD-10-CM | POA: Diagnosis not present

## 2014-10-22 DIAGNOSIS — C189 Malignant neoplasm of colon, unspecified: Secondary | ICD-10-CM

## 2014-10-22 DIAGNOSIS — J189 Pneumonia, unspecified organism: Secondary | ICD-10-CM | POA: Diagnosis not present

## 2014-10-22 DIAGNOSIS — C7989 Secondary malignant neoplasm of other specified sites: Secondary | ICD-10-CM

## 2014-10-22 DIAGNOSIS — Z006 Encounter for examination for normal comparison and control in clinical research program: Secondary | ICD-10-CM

## 2014-10-22 DIAGNOSIS — C787 Secondary malignant neoplasm of liver and intrahepatic bile duct: Secondary | ICD-10-CM

## 2014-10-22 LAB — COMPREHENSIVE METABOLIC PANEL (CC13)
ALBUMIN: 3.3 g/dL — AB (ref 3.5–5.0)
ALK PHOS: 93 U/L (ref 40–150)
ALT: 43 U/L (ref 0–55)
AST: 26 U/L (ref 5–34)
Anion Gap: 11 mEq/L (ref 3–11)
BUN: 24.3 mg/dL (ref 7.0–26.0)
CALCIUM: 8.8 mg/dL (ref 8.4–10.4)
CHLORIDE: 106 meq/L (ref 98–109)
CO2: 26 mEq/L (ref 22–29)
Creatinine: 1.2 mg/dL (ref 0.7–1.3)
EGFR: 67 mL/min/{1.73_m2} — ABNORMAL LOW (ref >90)
Glucose: 95 mg/dl (ref 70–140)
POTASSIUM: 4.3 meq/L (ref 3.5–5.1)
Sodium: 143 mEq/L (ref 136–145)
Total Bilirubin: 0.43 mg/dL (ref 0.20–1.20)
Total Protein: 6 g/dL — ABNORMAL LOW (ref 6.4–8.3)

## 2014-10-22 LAB — CBC WITH DIFFERENTIAL/PLATELET
BASO%: 0.4 % (ref 0.0–2.0)
BASOS ABS: 0 10*3/uL (ref 0.0–0.1)
EOS%: 1.3 % (ref 0.0–7.0)
Eosinophils Absolute: 0.2 10*3/uL (ref 0.0–0.5)
HCT: 39.1 % (ref 38.4–49.9)
HEMOGLOBIN: 13.2 g/dL (ref 13.0–17.1)
LYMPH%: 17.9 % (ref 14.0–49.0)
MCH: 33.1 pg (ref 27.2–33.4)
MCHC: 33.9 g/dL (ref 32.0–36.0)
MCV: 97.6 fL (ref 79.3–98.0)
MONO#: 0.5 10*3/uL (ref 0.1–0.9)
MONO%: 4.5 % (ref 0.0–14.0)
NEUT%: 75.9 % — AB (ref 39.0–75.0)
NEUTROS ABS: 9.1 10*3/uL — AB (ref 1.5–6.5)
PLATELETS: 149 10*3/uL (ref 140–400)
RBC: 4.01 10*6/uL — ABNORMAL LOW (ref 4.20–5.82)
RDW: 15.7 % — AB (ref 11.0–14.6)
WBC: 12 10*3/uL — ABNORMAL HIGH (ref 4.0–10.3)
lymph#: 2.1 10*3/uL (ref 0.9–3.3)

## 2014-10-22 LAB — UA PROTEIN, DIPSTICK - CHCC: Protein, ur: NEGATIVE mg/dL

## 2014-10-22 LAB — RESEARCH LABS

## 2014-10-22 LAB — MAGNESIUM (CC13): MAGNESIUM: 2.5 mg/dL (ref 1.5–2.5)

## 2014-10-22 MED ORDER — SODIUM CHLORIDE 0.9 % IV SOLN
Freq: Once | INTRAVENOUS | Status: AC
  Administered 2014-10-22: 13:00:00 via INTRAVENOUS
  Filled 2014-10-22: qty 5

## 2014-10-22 MED ORDER — LEUCOVORIN CALCIUM INJECTION 350 MG
320.0000 mg/m2 | Freq: Once | INTRAMUSCULAR | Status: AC
  Administered 2014-10-22: 762 mg via INTRAVENOUS
  Filled 2014-10-22: qty 38.1

## 2014-10-22 MED ORDER — SODIUM CHLORIDE 0.9 % IV SOLN
2000.0000 mg/m2 | INTRAVENOUS | Status: DC
  Administered 2014-10-22: 4750 mg via INTRAVENOUS
  Filled 2014-10-22: qty 95

## 2014-10-22 MED ORDER — PALONOSETRON HCL INJECTION 0.25 MG/5ML
0.2500 mg | Freq: Once | INTRAVENOUS | Status: AC
  Administered 2014-10-22: 0.25 mg via INTRAVENOUS

## 2014-10-22 MED ORDER — SODIUM CHLORIDE 0.9 % IV SOLN
5.0000 mg/kg | Freq: Once | INTRAVENOUS | Status: AC
  Administered 2014-10-22: 575 mg via INTRAVENOUS
  Filled 2014-10-22: qty 23

## 2014-10-22 MED ORDER — ALTEPLASE 2 MG IJ SOLR
2.0000 mg | Freq: Once | INTRAMUSCULAR | Status: AC | PRN
  Administered 2014-10-22: 2 mg
  Filled 2014-10-22: qty 2

## 2014-10-22 MED ORDER — ATROPINE SULFATE 1 MG/ML IJ SOLN
INTRAMUSCULAR | Status: AC
  Filled 2014-10-22: qty 1

## 2014-10-22 MED ORDER — SODIUM CHLORIDE 0.9 % IV SOLN
Freq: Once | INTRAVENOUS | Status: AC
  Administered 2014-10-22: 13:00:00 via INTRAVENOUS

## 2014-10-22 MED ORDER — FLUOROURACIL CHEMO INJECTION 2.5 GM/50ML
320.0000 mg/m2 | Freq: Once | INTRAVENOUS | Status: AC
  Administered 2014-10-22: 750 mg via INTRAVENOUS
  Filled 2014-10-22: qty 15

## 2014-10-22 MED ORDER — PALONOSETRON HCL INJECTION 0.25 MG/5ML
INTRAVENOUS | Status: AC
  Filled 2014-10-22: qty 5

## 2014-10-22 MED ORDER — IRINOTECAN HCL CHEMO INJECTION 100 MG/5ML
180.0000 mg/m2 | Freq: Once | INTRAVENOUS | Status: AC
  Administered 2014-10-22: 428 mg via INTRAVENOUS
  Filled 2014-10-22: qty 21.4

## 2014-10-22 MED ORDER — ATROPINE SULFATE 1 MG/ML IJ SOLN
0.5000 mg | Freq: Once | INTRAMUSCULAR | Status: AC | PRN
  Administered 2014-10-22: 0.5 mg via INTRAVENOUS

## 2014-10-22 NOTE — Progress Notes (Signed)
[place] 1317. Cycle 3 Day 1: Met with patient and spouse in exam room today for Cycle 3, Day 1 appointment with NP, Ned Card. Patient reports tingling in Left big toe and in finger tips stable and has not worsened. Patient's 10/18/14 restaging CT scan resulted with Per MD and CT scan, stable disease and Dr. Benay Spice has reviewed with patient. CT scan also showed patient with left lower lobe bronchopneumonia and levaquin was started 05/13. Patient states he has had a nonproductive cough for approximately 3 weeks and thought it was allergies and post nasal drip. Today patient reports he has not been actively coughing. Patient reports energy level good, some SOB with exertion. NP assessment with trace edema to BLE. And external abdominal mass continues to measure approximately 7 x 7 cm. Patient denies any other symptoms or concerns. Per Dr. Benay Spice patient to continue with current dosage (dose reduction -1 level) and ok to proceed for treatment today. Patient's questions answered to his satisfaction, denies further questions at this time and knows to call clinic with any questions/concerns. Adele Dan, RN, Clinical Research 10/22/2014 2:02 PM

## 2014-10-22 NOTE — Progress Notes (Signed)
1750 Pt walks back into treatment area; report pump reading high pressure, "it feels like pressure on my port; hurts a little"  Re-started pump for 1-2 minutes---no alarms went off.  Paused pump to check blood return from port.  After flushing port X2 no blood return noted.  Notified Dr. Benay Spice of all information; orders given to re-access port; if no blood; TPA over night and pt will come back in am for re-check.  [place] port with no blood returned noted.  Pt states "hurts at the bottom of the port"  Pt denies any pain when port flushed.  Dr. Benay Spice in infusion to assess port; TPA administered per orders and pt verbalized understanding to return back to [facility] @ 8:45 for re-check.

## 2014-10-22 NOTE — Progress Notes (Addendum)
[place] OFFICE PROGRESS NOTE   Diagnosis:  Colon cancer  INTERVAL HISTORY:   Mr. Ciampi returns as scheduled. He completed cycle 4 FOLFIRI/Avastin on 10/08/2014. He denies nausea/vomiting. He had a few small mouth sores. The mouth sores have resolved. No diarrhea. He denies pain. He has had a cough for the past 3 weeks. He was started on a course of Levaquin 10/19/2014. He has noted some improvement in the cough. He denies fever. He has mild dyspnea on exertion.  Objective:  Vital signs in last 24 hours:  Blood pressure 142/84, pulse 69, temperature 97.9 F (36.6 C), temperature source Oral, resp. rate 19, height $RemoveBe'5\' 11"'FJVlvFHOa$  (1.803 m), weight 251 lb 6.4 oz (114.034 kg), SpO2 100 %.    HEENT: No thrush or ulcers. Resp: Lungs clear bilaterally. Cardio: Regular rate and rhythm. GI: Abdomen soft and nontender. No hepatomegaly. Approximate 7 x 7 mid upper abdominal wall mass. Vascular: Trace lower leg edema bilaterally. Calves soft and nontender.  Port-A-Cath without erythema.    Lab Results:  Lab Results  Component Value Date   WBC 12.0* 10/22/2014   HGB 13.2 10/22/2014   HCT 39.1 10/22/2014   MCV 97.6 10/22/2014   PLT 149 10/22/2014   NEUTROABS 9.1* 10/22/2014    Imaging:  No results found.  Medications: I have reviewed the patient's current medications.  Assessment/Plan: 1. 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 Microsatellite stable; preserved expression of major and minor MMR proteins. He began treatment with FOLFOX in December 2011. He completed cycle #12 on 10/27/2010. Oxaliplatin was held with cycles number 7 through 10 and resumed with cycle #11. 2. History of neutropenia secondary to chemotherapy. 3. History of thrombocytopenia secondary chemotherapy. 4. He did not undergo a preoperative colonoscopy. He underwent a colonoscopy on  12/19/2010 with findings of a 1 cm polyp at 90 cm from the anal verge, minimal polyp at 30 cm from the anal verge and minimal diverticulosis. Colonoscopy 03/18/2012-negative. 5. 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 He began study drug on 05/13/2010. 6. Status post Port-A-Cath placement. The Port-A-Cath has been removed. 7. 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 9. Low attenuation lesion in the caudate lobe of the liver on the CT 04/11/2012-? Cyst. MRI liver on 04/20/2012 favored the caudate lobe liver lesion to represent a cyst or complex cyst. CT abdomen/pelvis 04/17/2013 with continued enlargement of hypovascular lesion in the caudate lobe of the liver.  PET scan 10/27/2011 confirmed a hypermetabolic caudate lobe liver lesion, tiny indeterminate lung nodules, and a 9 mm level II left neck node   CT biopsy of the liver lesion 11/07/2013 confirmed adenocarcinoma  Left liver and caudate resection 01/15/2014-pathology confirmed metastatic colon cancer, and negative surgical margins  Surveillance CT scans 07/24/2014 consistent with recurrent disease involving upper abdominal lymph nodes, peritoneal implants, and an anterior abdominal wall mass  Status post biopsy of the anterior abdominal wall mass 08/06/2014 with pathology showing metastatic adenocarcinoma consistent with a colon primary.  UGT1A1 1/28  Cycle 1 FOLFIRI/Avastin on the [facility] 1317 08/20/2014  Cycle 2 FOLFIRI/Avastin 09/03/2014  Cycle 3 held on 09/17/2014 due to neutropenia  Cycle 3 FOLFIRI/Avastin 09/25/2014. Neulasta added beginning with cycle 3.  Cycle 4 FOLFIRI/Avastin 10/08/2014  Restaging CT scans 10/18/2014 with slight interval increase in  the size of the soft tissue mass in the subcutaneous fat of the epigastric region. Underlying peritoneal  implants, probable focus of residual disease along the resected margin of the liver and hepatoduodenal ligament lymphadenopathy all appeared slightly improved. No new sites of metastatic disease otherwise noted. New left lower lobe bronchopneumonia. 10. Iron deposition within the liver noted on MRI 04/20/2012. The ferritin level was normal on 04/17/2013. Increased iron deposition noted on the left liver resection 01/15/2014 11. Pain/bleeding at the anus reported when here 04/24/2013. Status post evaluation by Dr. [name] with findings of an anal fissure. 12. Right face cystic lesion 13. Status post Port-A-Cath placement 08/14/2014 14. Delayed nausea following FOLFIRI, prophylactic Decadron added with cycle 2 with improvement. 15. Chest CT 10/18/2014 with new left lower lobe bronchopneumonia. Levaquin initiated.   Disposition: Bryan Fields appears stable. He has completed 4 cycles of FOLFIRI/Avastin. The restaging CT evaluation shows overall stable disease. Plan to proceed with cycle 5 today as scheduled. He will return for a follow-up visit and cycle 6 in 2 weeks. He will contact the office in the interim with any problems. We specifically discussed worsening cough, shortness of breath.  ECOG performance status measured at 1 today.  Patient seen with Dr. Benay Spice.  Ned Card ANP/GNP-BC   10/22/2014  11:31 AM  This was a shared visit with Ned Card. I reviewed the restaging CT findings with Mr. Bedoya by telephone on 10/19/2014 and again today. He has overall stable disease. The plan is to continue FOLFIRI/Avastin with irinotecan at the current dose. He will return for an office visit and chemotherapy in 2 weeks. He will complete the course of Levaquin for pneumonia. He does not appear to have significant symptoms of pneumonia at present.  Julieanne Manson, M.D.

## 2014-10-22 NOTE — Telephone Encounter (Signed)
Gave and printed appt schwed and avs fo rpt for May and June

## 2014-10-22 NOTE — Addendum Note (Signed)
Addended by: Kellie Simmering A on: 10/22/2014 06:32 PM   Modules accepted: Orders, SmartSet

## 2014-10-22 NOTE — Patient Instructions (Signed)
[facility] Discharge Instructions for Patients Receiving Chemotherapy  Today you received the following chemotherapy agents: Avastin, Irinotecan, Leucovorin, Adrucil   To help prevent nausea and vomiting after your treatment, we encourage you to take your nausea medication as directed.    If you develop nausea and vomiting that is not controlled by your nausea medication, call the clinic.   BELOW ARE SYMPTOMS THAT SHOULD BE REPORTED IMMEDIATELY:  *FEVER GREATER THAN 100.5 F  *CHILLS WITH OR WITHOUT FEVER  NAUSEA AND VOMITING THAT IS NOT CONTROLLED WITH YOUR NAUSEA MEDICATION  *UNUSUAL SHORTNESS OF BREATH  *UNUSUAL BRUISING OR BLEEDING  TENDERNESS IN MOUTH AND THROAT WITH OR WITHOUT PRESENCE OF ULCERS  *URINARY PROBLEMS  *BOWEL PROBLEMS  UNUSUAL RASH Items with * indicate a potential emergency and should be followed up as soon as possible.  Feel free to call the clinic you have any questions or concerns. The clinic phone number is [phone].  Please show the CHEMO ALERT CARD at check-in to the Emergency Department and triage nurse.   

## 2014-10-22 NOTE — Telephone Encounter (Signed)
s/w Bryan Fields a representative a third party company collecting information for [facility] about avastin regimen for pt.

## 2014-10-23 ENCOUNTER — Encounter: Payer: Self-pay | Admitting: Medical Oncology

## 2014-10-23 ENCOUNTER — Other Ambulatory Visit: Payer: Self-pay | Admitting: Oncology

## 2014-10-23 ENCOUNTER — Ambulatory Visit: Payer: 59

## 2014-10-23 VITALS — BP 137/85 | HR 79 | Temp 98.2°F | Resp 18

## 2014-10-23 DIAGNOSIS — C189 Malignant neoplasm of colon, unspecified: Secondary | ICD-10-CM

## 2014-10-23 LAB — CEA: CEA: 0.7 ng/mL (ref 0.0–5.0)

## 2014-10-23 MED ORDER — FLUOROURACIL CHEMO INJECTION 5 GM/100ML
2000.0000 mg/m2 | INTRAVENOUS | Status: DC
  Administered 2014-10-23: 4750 mg via INTRAVENOUS
  Filled 2014-10-23: qty 95

## 2014-10-23 NOTE — Progress Notes (Signed)
[id number] Met with patient and wife this morning in treatment room. I was informed this morning that patient had returned to treatment last night soon after portable infusion pump was connected with continuous 5FU infusion (48 hour) due to "high pressure" reading on pump and lack of blood return from port. Per Dr. Gearldine Shown order port instilled with TPA overnight and patient to return this am for re-check. This morning patient's port with good blood return, 5FU continuous infusion reinitiated and patient to return 05/19 for pump d/c. Patient knows to contact clinic with any questions or concerns. Adele Dan, RN, Clinical Research 10/23/2014 3:40 PM

## 2014-10-23 NOTE — Progress Notes (Signed)
Port site unremarkable. 36mL blood/ alteplase withdrawn. Brisk blood return. Port flushes easily, pt denies any discomfort.

## 2014-10-24 ENCOUNTER — Ambulatory Visit: Payer: 59

## 2014-10-25 ENCOUNTER — Ambulatory Visit (HOSPITAL_BASED_OUTPATIENT_CLINIC_OR_DEPARTMENT_OTHER): Payer: 59

## 2014-10-25 ENCOUNTER — Ambulatory Visit: Payer: 59

## 2014-10-25 ENCOUNTER — Telehealth: Payer: Self-pay | Admitting: *Deleted

## 2014-10-25 VITALS — BP 151/86 | HR 59 | Temp 96.9°F

## 2014-10-25 DIAGNOSIS — Z006 Encounter for examination for normal comparison and control in clinical research program: Secondary | ICD-10-CM | POA: Diagnosis not present

## 2014-10-25 DIAGNOSIS — Z5189 Encounter for other specified aftercare: Secondary | ICD-10-CM | POA: Diagnosis not present

## 2014-10-25 DIAGNOSIS — C182 Malignant neoplasm of ascending colon: Secondary | ICD-10-CM | POA: Diagnosis not present

## 2014-10-25 DIAGNOSIS — C189 Malignant neoplasm of colon, unspecified: Secondary | ICD-10-CM

## 2014-10-25 MED ORDER — PEGFILGRASTIM INJECTION 6 MG/0.6ML ~~LOC~~
6.0000 mg | PREFILLED_SYRINGE | Freq: Once | SUBCUTANEOUS | Status: AC
  Administered 2014-10-25: 6 mg via SUBCUTANEOUS
  Filled 2014-10-25: qty 0.6

## 2014-10-25 MED ORDER — SODIUM CHLORIDE 0.9 % IJ SOLN
10.0000 mL | INTRAMUSCULAR | Status: DC | PRN
  Administered 2014-10-25: 10 mL
  Filled 2014-10-25: qty 10

## 2014-10-25 MED ORDER — HEPARIN SOD (PORK) LOCK FLUSH 100 UNIT/ML IV SOLN
500.0000 [IU] | Freq: Once | INTRAVENOUS | Status: AC | PRN
  Administered 2014-10-25: 500 [IU]
  Filled 2014-10-25: qty 5

## 2014-10-25 NOTE — Patient Instructions (Signed)
Pegfilgrastim injection What is this medicine? PEGFILGRASTIM (peg fil GRA stim) is a long-acting granulocyte colony-stimulating factor that stimulates the growth of neutrophils, a type of white blood cell important in the body's fight against infection. It is used to reduce the incidence of fever and infection in patients with certain types of cancer who are receiving chemotherapy that affects the bone marrow. This medicine may be used for other purposes; ask your health care provider or pharmacist if you have questions. COMMON BRAND NAME(S): Neulasta What should I tell my health care provider before I take this medicine? They need to know if you have any of these conditions: -latex allergy -ongoing radiation therapy -sickle cell disease -skin reactions to acrylic adhesives (On-Body Injector only) -an unusual or allergic reaction to pegfilgrastim, filgrastim, other medicines, foods, dyes, or preservatives -pregnant or trying to get pregnant -breast-feeding How should I use this medicine? This medicine is for injection under the skin. If you get this medicine at home, you will be taught how to prepare and give the pre-filled syringe or how to use the On-body Injector. Refer to the patient Instructions for Use for detailed instructions. Use exactly as directed. Take your medicine at regular intervals. Do not take your medicine more often than directed. It is important that you put your used needles and syringes in a special sharps container. Do not put them in a trash can. If you do not have a sharps container, call your pharmacist or healthcare provider to get one. Talk to your pediatrician regarding the use of this medicine in children. Special care may be needed. Overdosage: If you think you have taken too much of this medicine contact a poison control center or emergency room at once. NOTE: This medicine is only for you. Do not share this medicine with others. What if I miss a dose? It is  important not to miss your dose. Call your doctor or health care professional if you miss your dose. If you miss a dose due to an On-body Injector failure or leakage, a new dose should be administered as soon as possible using a single prefilled syringe for manual use. What may interact with this medicine? Interactions have not been studied. Give your health care provider a list of all the medicines, herbs, non-prescription drugs, or dietary supplements you use. Also tell them if you smoke, drink alcohol, or use illegal drugs. Some items may interact with your medicine. This list may not describe all possible interactions. Give your health care provider a list of all the medicines, herbs, non-prescription drugs, or dietary supplements you use. Also tell them if you smoke, drink alcohol, or use illegal drugs. Some items may interact with your medicine. What should I watch for while using this medicine? You may need blood work done while you are taking this medicine. If you are going to need a MRI, CT scan, or other procedure, tell your doctor that you are using this medicine (On-Body Injector only). What side effects may I notice from receiving this medicine? Side effects that you should report to your doctor or health care professional as soon as possible: -allergic reactions like skin rash, itching or hives, swelling of the face, lips, or tongue -dizziness -fever -pain, redness, or irritation at site where injected -pinpoint red spots on the skin -shortness of breath or breathing problems -stomach or side pain, or pain at the shoulder -swelling -tiredness -trouble passing urine Side effects that usually do not require medical attention (report to your doctor   or health care professional if they continue or are bothersome): -bone pain -muscle pain This list may not describe all possible side effects. Call your doctor for medical advice about side effects. You may report side effects to FDA at  1-800-FDA-1088. Where should I keep my medicine? Keep out of the reach of children. Store pre-filled syringes in a refrigerator between 2 and 8 degrees C (36 and 46 degrees F). Do not freeze. Keep in carton to protect from light. Throw away this medicine if it is left out of the refrigerator for more than 48 hours. Throw away any unused medicine after the expiration date. NOTE: This sheet is a summary. It may not cover all possible information. If you have questions about this medicine, talk to your doctor, pharmacist, or health care provider.  2015, Elsevier/Gold Standard. (2013-08-24 16:14:05)  

## 2014-10-25 NOTE — Telephone Encounter (Signed)
-----   Message from Owens Shark, NP sent at 10/23/2014  9:11 AM EDT ----- Please let him know the CEA from yesterday was 0.7.

## 2014-10-25 NOTE — Telephone Encounter (Signed)
Called and informed patient that CEA was 0.7.  Per Elby Showers. Marcello Moores, NP.  Patient verbalized understanding.

## 2014-11-02 ENCOUNTER — Other Ambulatory Visit: Payer: Self-pay | Admitting: Medical Oncology

## 2014-11-02 ENCOUNTER — Telehealth: Payer: Self-pay | Admitting: Oncology

## 2014-11-02 DIAGNOSIS — C189 Malignant neoplasm of colon, unspecified: Secondary | ICD-10-CM

## 2014-11-02 NOTE — Telephone Encounter (Signed)
Moved 5/31 lab appointment from 8:45am to [place] with patient he is aware.

## 2014-11-06 ENCOUNTER — Ambulatory Visit (HOSPITAL_BASED_OUTPATIENT_CLINIC_OR_DEPARTMENT_OTHER): Payer: 59 | Admitting: Oncology

## 2014-11-06 ENCOUNTER — Other Ambulatory Visit (HOSPITAL_BASED_OUTPATIENT_CLINIC_OR_DEPARTMENT_OTHER): Payer: 59

## 2014-11-06 ENCOUNTER — Encounter: Payer: Self-pay | Admitting: *Deleted

## 2014-11-06 ENCOUNTER — Ambulatory Visit (HOSPITAL_BASED_OUTPATIENT_CLINIC_OR_DEPARTMENT_OTHER): Payer: 59

## 2014-11-06 VITALS — BP 128/72 | HR 58 | Temp 98.7°F | Resp 17 | Ht 71.0 in | Wt 250.6 lb

## 2014-11-06 DIAGNOSIS — C7989 Secondary malignant neoplasm of other specified sites: Secondary | ICD-10-CM

## 2014-11-06 DIAGNOSIS — Z006 Encounter for examination for normal comparison and control in clinical research program: Secondary | ICD-10-CM

## 2014-11-06 DIAGNOSIS — C787 Secondary malignant neoplasm of liver and intrahepatic bile duct: Secondary | ICD-10-CM

## 2014-11-06 DIAGNOSIS — C189 Malignant neoplasm of colon, unspecified: Secondary | ICD-10-CM

## 2014-11-06 DIAGNOSIS — C182 Malignant neoplasm of ascending colon: Secondary | ICD-10-CM

## 2014-11-06 DIAGNOSIS — Z5112 Encounter for antineoplastic immunotherapy: Secondary | ICD-10-CM | POA: Diagnosis not present

## 2014-11-06 LAB — COMPREHENSIVE METABOLIC PANEL (CC13)
ALBUMIN: 3.2 g/dL — AB (ref 3.5–5.0)
ALK PHOS: 76 U/L (ref 40–150)
ALT: 55 U/L (ref 0–55)
AST: 35 U/L — AB (ref 5–34)
Anion Gap: 10 mEq/L (ref 3–11)
BUN: 16.6 mg/dL (ref 7.0–26.0)
CO2: 26 mEq/L (ref 22–29)
Calcium: 8.3 mg/dL — ABNORMAL LOW (ref 8.4–10.4)
Chloride: 107 mEq/L (ref 98–109)
Creatinine: 1.1 mg/dL (ref 0.7–1.3)
EGFR: 73 mL/min/{1.73_m2} — ABNORMAL LOW (ref >90)
Glucose: 134 mg/dl (ref 70–140)
Potassium: 3.7 mEq/L (ref 3.5–5.1)
Sodium: 142 mEq/L (ref 136–145)
TOTAL PROTEIN: 5.7 g/dL — AB (ref 6.4–8.3)
Total Bilirubin: 0.81 mg/dL (ref 0.20–1.20)

## 2014-11-06 LAB — CBC WITH DIFFERENTIAL/PLATELET
BASO%: 0.7 % (ref 0.0–2.0)
Basophils Absolute: 0 10*3/uL (ref 0.0–0.1)
EOS ABS: 0.2 10*3/uL (ref 0.0–0.5)
EOS%: 3.7 % (ref 0.0–7.0)
HCT: 38.5 % (ref 38.4–49.9)
HGB: 13.3 g/dL (ref 13.0–17.1)
LYMPH%: 25.1 % (ref 14.0–49.0)
MCH: 34.2 pg — ABNORMAL HIGH (ref 27.2–33.4)
MCHC: 34.5 g/dL (ref 32.0–36.0)
MCV: 99 fL — ABNORMAL HIGH (ref 79.3–98.0)
MONO#: 0.4 10*3/uL (ref 0.1–0.9)
MONO%: 6.2 % (ref 0.0–14.0)
NEUT%: 64.3 % (ref 39.0–75.0)
NEUTROS ABS: 4.1 10*3/uL (ref 1.5–6.5)
PLATELETS: 147 10*3/uL (ref 140–400)
RBC: 3.89 10*6/uL — ABNORMAL LOW (ref 4.20–5.82)
RDW: 17.9 % — AB (ref 11.0–14.6)
WBC: 6.3 10*3/uL (ref 4.0–10.3)
lymph#: 1.6 10*3/uL (ref 0.9–3.3)

## 2014-11-06 LAB — MAGNESIUM (CC13): MAGNESIUM: 2.4 mg/dL (ref 1.5–2.5)

## 2014-11-06 LAB — UA PROTEIN, DIPSTICK - CHCC: Protein, ur: <30 mg/dL

## 2014-11-06 MED ORDER — PALONOSETRON HCL INJECTION 0.25 MG/5ML
INTRAVENOUS | Status: AC
  Filled 2014-11-06: qty 5

## 2014-11-06 MED ORDER — SODIUM CHLORIDE 0.9 % IV SOLN
2000.0000 mg/m2 | INTRAVENOUS | Status: DC
  Administered 2014-11-06: 4750 mg via INTRAVENOUS
  Filled 2014-11-06: qty 95

## 2014-11-06 MED ORDER — IRINOTECAN HCL CHEMO INJECTION 100 MG/5ML
180.0000 mg/m2 | Freq: Once | INTRAVENOUS | Status: AC
  Administered 2014-11-06: 428 mg via INTRAVENOUS
  Filled 2014-11-06: qty 21.4

## 2014-11-06 MED ORDER — SODIUM CHLORIDE 0.9 % IV SOLN
Freq: Once | INTRAVENOUS | Status: AC
  Administered 2014-11-06: 10:00:00 via INTRAVENOUS
  Filled 2014-11-06: qty 5

## 2014-11-06 MED ORDER — SODIUM CHLORIDE 0.9 % IV SOLN
Freq: Once | INTRAVENOUS | Status: AC
  Administered 2014-11-06: 10:00:00 via INTRAVENOUS

## 2014-11-06 MED ORDER — LEUCOVORIN CALCIUM INJECTION 350 MG
320.0000 mg/m2 | Freq: Once | INTRAVENOUS | Status: AC
  Administered 2014-11-06: 762 mg via INTRAVENOUS
  Filled 2014-11-06: qty 38.1

## 2014-11-06 MED ORDER — SODIUM CHLORIDE 0.9 % IV SOLN
5.0000 mg/kg | Freq: Once | INTRAVENOUS | Status: AC
  Administered 2014-11-06: 575 mg via INTRAVENOUS
  Filled 2014-11-06: qty 23

## 2014-11-06 MED ORDER — ATROPINE SULFATE 1 MG/ML IJ SOLN
INTRAMUSCULAR | Status: AC
  Filled 2014-11-06: qty 1

## 2014-11-06 MED ORDER — FLUOROURACIL CHEMO INJECTION 2.5 GM/50ML
320.0000 mg/m2 | Freq: Once | INTRAVENOUS | Status: AC
  Administered 2014-11-06: 750 mg via INTRAVENOUS
  Filled 2014-11-06: qty 15

## 2014-11-06 MED ORDER — ATROPINE SULFATE 1 MG/ML IJ SOLN
0.5000 mg | Freq: Once | INTRAMUSCULAR | Status: AC | PRN
  Administered 2014-11-06: 0.5 mg via INTRAVENOUS

## 2014-11-06 MED ORDER — PALONOSETRON HCL INJECTION 0.25 MG/5ML
0.2500 mg | Freq: Once | INTRAVENOUS | Status: AC
  Administered 2014-11-06: 0.25 mg via INTRAVENOUS

## 2014-11-06 NOTE — Progress Notes (Signed)
11/06/2014 0845  [facility] 1317 Cycle 3, day 15 Patient in to clinic today with his wife. Patient reports that he has had fatigue over the past 2 weeks, but that it has improved.He states that he takes frequent naps to rest in between. He complains of mild infrequent nosebleeds. He states that his bowels have been normal. He denies abdominal pain, mouth sores, diarrhea and constipation.   Patient was examined by Dr. Benay Spice.  The external abdominal mass measured 7 cm transverse and 6 cm longitudinal.  Based on lab results review and history and physical by Dr. Benay Spice , the patient's condition is acceptable for continued treatment.  Sign for infusion was explained and given to Ortencia Kick, Therapist, sports.  She had no questions and verbalized understanding of instructions. Marcellus Scott, RN Clinical Research

## 2014-11-06 NOTE — Patient Instructions (Signed)
[place] Discharge Instructions for Patients Receiving Chemotherapy  Today you received the following chemotherapy agents Avastin, Irinotecan, 5FU, Leucovorin.  To help prevent nausea and vomiting after your treatment, we encourage you to take your nausea medication as prescribed.   If you develop nausea and vomiting that is not controlled by your nausea medication, call the clinic.   BELOW ARE SYMPTOMS THAT SHOULD BE REPORTED IMMEDIATELY:  *FEVER GREATER THAN 100.5 F  *CHILLS WITH OR WITHOUT FEVER  NAUSEA AND VOMITING THAT IS NOT CONTROLLED WITH YOUR NAUSEA MEDICATION  *UNUSUAL SHORTNESS OF BREATH  *UNUSUAL BRUISING OR BLEEDING  TENDERNESS IN MOUTH AND THROAT WITH OR WITHOUT PRESENCE OF ULCERS  *URINARY PROBLEMS  *BOWEL PROBLEMS  UNUSUAL RASH Items with * indicate a potential emergency and should be followed up as soon as possible.  Feel free to call the clinic you have any questions or concerns. The clinic phone number is [phone].  Please show the [place] at check-in to the Emergency Department and triage nurse.

## 2014-11-06 NOTE — Progress Notes (Signed)
[place] OFFICE PROGRESS NOTE   Diagnosis: Colon cancer  INTERVAL HISTORY:   Bryan Fields returns as scheduled. He completed another cycle of FOLFIRI/Avastin 10/22/2014. No nausea/vomiting, mouth sores, or diarrhea. He reports mild nose bleeding. His chief complaint is malaise for several days following chemotherapy. He has associated somnolence. No pain. The abdominal wall mass has not changed significantly.  Objective:  Vital signs in last 24 hours:  Blood pressure 128/72, pulse 58, temperature 98.7 F (37.1 C), temperature source Oral, resp. rate 17, height $RemoveBe'5\' 11"'rDVLMZYZT$  (1.803 m), weight 250 lb 9.6 oz (113.671 kg), SpO2 100 %.    HEENT: No thrush or ulcers Resp: Lungs clear bilaterally Cardio: Regular rate and rhythm GI: No hepatomegaly, nontender, the abdominal wall mass measures 7 cm in transverse dimension and 7 cm in vertical dimension Vascular: No leg edema  Skin: Mild hyperpigmentation of the hands   Portacath/PICC-without erythema  Lab Results:  Lab Results  Component Value Date   WBC 6.3 11/06/2014   HGB 13.3 11/06/2014   HCT 38.5 11/06/2014   MCV 99.0* 11/06/2014   PLT 147 11/06/2014   NEUTROABS 4.1 11/06/2014      Lab Results  Component Value Date   CEA 0.7 10/22/2014     Medications: I have reviewed the patient's current medications.  Assessment/Plan: 1. 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 Microsatellite stable; preserved expression of major and minor MMR proteins. He began treatment with FOLFOX in December 2011. He completed cycle #12 on 10/27/2010. Oxaliplatin was held with cycles number 7 through 10 and resumed with cycle #11. 2. History of neutropenia secondary to chemotherapy. 3. History of thrombocytopenia secondary chemotherapy. 4. He did not undergo a preoperative colonoscopy. He underwent a colonoscopy on  12/19/2010 with findings of a 1 cm polyp at 90 cm from the anal verge, minimal polyp at 30 cm from the anal verge and minimal diverticulosis. Colonoscopy 03/18/2012-negative. 5. 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 He began study drug on 05/13/2010. 6. Status post Port-A-Cath placement. The Port-A-Cath has been removed. 7. 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 9. Low attenuation lesion in the caudate lobe of the liver on the CT 04/11/2012-? Cyst. MRI liver on 04/20/2012 favored the caudate lobe liver lesion to represent a cyst or complex cyst. CT abdomen/pelvis 04/17/2013 with continued enlargement of hypovascular lesion in the caudate lobe of the liver.  PET scan 10/27/2011 confirmed a hypermetabolic caudate lobe liver lesion, tiny indeterminate lung nodules, and a 9 mm level II left neck node   CT biopsy of the liver lesion 11/07/2013 confirmed adenocarcinoma  Left liver and caudate resection 01/15/2014-pathology confirmed metastatic colon cancer, and negative surgical margins  Surveillance CT scans 07/24/2014 consistent with recurrent disease involving upper abdominal lymph nodes, peritoneal implants, and an anterior abdominal wall mass  Status post biopsy of the anterior abdominal wall mass 08/06/2014 with pathology showing metastatic adenocarcinoma consistent with a colon primary.  UGT1A1 1/28  Cycle 1 FOLFIRI/Avastin on the [facility] 1317 08/20/2014  Cycle 2 FOLFIRI/Avastin 09/03/2014  Cycle 3 held on 09/17/2014 due to neutropenia  Cycle 3 FOLFIRI/Avastin 09/25/2014. Neulasta added beginning with cycle 3.  Cycle 4 FOLFIRI/Avastin 10/08/2014  Restaging CT scans 10/18/2014 with slight interval increase in the size of the soft tissue mass in  the subcutaneous fat of the epigastric region. Underlying peritoneal  implants, probable focus of residual disease along the resected margin of the liver and hepatoduodenal ligament lymphadenopathy all appeared slightly improved. No new sites of metastatic disease otherwise noted. New left lower lobe bronchopneumonia.  Cycle 5 FOLFIRI/Avastin 10/22/2014  Cycle 6 FOLFIRI/Avastin 11/06/2014 10. Iron deposition within the liver noted on MRI 04/20/2012. The ferritin level was normal on 04/17/2013. Increased iron deposition noted on the left liver resection 01/15/2014 11. Pain/bleeding at the anus reported when here 04/24/2013. Status post evaluation by Dr. [name] with findings of an anal fissure. 12. Right face cystic lesion 13. Status post Port-A-Cath placement 08/14/2014 14. Delayed nausea following FOLFIRI, prophylactic Decadron added with cycle 2 with improvement. 15. Chest CT 10/18/2014 with new left lower lobe bronchopneumonia. Levaquin initiated.  Disposition:  Bryan Fields appears stable. The plan is to continue FOLFIRI/Avastin per protocol. We discussed the current treatment plan and future treatment options. He understands no therapy will be curative. The plan is to continue FOLFIRI/Avastin as long as there is no evidence of disease progression or significant toxicity. His ECOG performance status is measured at 1 today.  Bryan Fields will return for an office visit and chemotherapy in 2 weeks.  Betsy Coder, MD  11/06/2014  8:57 AM

## 2014-11-08 ENCOUNTER — Ambulatory Visit: Payer: 59

## 2014-11-08 ENCOUNTER — Ambulatory Visit (HOSPITAL_BASED_OUTPATIENT_CLINIC_OR_DEPARTMENT_OTHER): Payer: 59

## 2014-11-08 VITALS — BP 123/81 | HR 72 | Temp 98.5°F | Resp 18

## 2014-11-08 DIAGNOSIS — Z006 Encounter for examination for normal comparison and control in clinical research program: Secondary | ICD-10-CM

## 2014-11-08 DIAGNOSIS — Z5189 Encounter for other specified aftercare: Secondary | ICD-10-CM | POA: Diagnosis not present

## 2014-11-08 DIAGNOSIS — C182 Malignant neoplasm of ascending colon: Secondary | ICD-10-CM | POA: Diagnosis not present

## 2014-11-08 DIAGNOSIS — C189 Malignant neoplasm of colon, unspecified: Secondary | ICD-10-CM

## 2014-11-08 MED ORDER — PEGFILGRASTIM INJECTION 6 MG/0.6ML ~~LOC~~
6.0000 mg | PREFILLED_SYRINGE | Freq: Once | SUBCUTANEOUS | Status: AC
  Administered 2014-11-08: 6 mg via SUBCUTANEOUS
  Filled 2014-11-08: qty 0.6

## 2014-11-08 MED ORDER — HEPARIN SOD (PORK) LOCK FLUSH 100 UNIT/ML IV SOLN
500.0000 [IU] | Freq: Once | INTRAVENOUS | Status: AC | PRN
  Administered 2014-11-08: 500 [IU]
  Filled 2014-11-08: qty 5

## 2014-11-08 MED ORDER — SODIUM CHLORIDE 0.9 % IJ SOLN
10.0000 mL | INTRAMUSCULAR | Status: DC | PRN
  Administered 2014-11-08: 10 mL
  Filled 2014-11-08: qty 10

## 2014-11-08 NOTE — Progress Notes (Signed)
Neulasta injection given by nurse while having pump DC'd.

## 2014-11-16 ENCOUNTER — Other Ambulatory Visit: Payer: Self-pay | Admitting: Medical Oncology

## 2014-11-16 DIAGNOSIS — C189 Malignant neoplasm of colon, unspecified: Secondary | ICD-10-CM

## 2014-11-18 ENCOUNTER — Other Ambulatory Visit: Payer: Self-pay | Admitting: Oncology

## 2014-11-19 ENCOUNTER — Ambulatory Visit (HOSPITAL_BASED_OUTPATIENT_CLINIC_OR_DEPARTMENT_OTHER): Payer: 59

## 2014-11-19 ENCOUNTER — Telehealth: Payer: Self-pay | Admitting: Nurse Practitioner

## 2014-11-19 ENCOUNTER — Other Ambulatory Visit (HOSPITAL_BASED_OUTPATIENT_CLINIC_OR_DEPARTMENT_OTHER): Payer: 59

## 2014-11-19 ENCOUNTER — Encounter: Payer: Self-pay | Admitting: Medical Oncology

## 2014-11-19 ENCOUNTER — Ambulatory Visit (HOSPITAL_BASED_OUTPATIENT_CLINIC_OR_DEPARTMENT_OTHER): Payer: 59 | Admitting: Nurse Practitioner

## 2014-11-19 VITALS — BP 140/78 | HR 64 | Temp 98.2°F | Resp 20 | Ht 71.0 in | Wt 246.2 lb

## 2014-11-19 VITALS — BP 125/74 | HR 68 | Resp 18

## 2014-11-19 DIAGNOSIS — C182 Malignant neoplasm of ascending colon: Secondary | ICD-10-CM

## 2014-11-19 DIAGNOSIS — C189 Malignant neoplasm of colon, unspecified: Secondary | ICD-10-CM

## 2014-11-19 DIAGNOSIS — Z5112 Encounter for antineoplastic immunotherapy: Secondary | ICD-10-CM

## 2014-11-19 DIAGNOSIS — C787 Secondary malignant neoplasm of liver and intrahepatic bile duct: Secondary | ICD-10-CM | POA: Diagnosis not present

## 2014-11-19 DIAGNOSIS — Z006 Encounter for examination for normal comparison and control in clinical research program: Secondary | ICD-10-CM

## 2014-11-19 DIAGNOSIS — R944 Abnormal results of kidney function studies: Secondary | ICD-10-CM | POA: Diagnosis not present

## 2014-11-19 DIAGNOSIS — C7989 Secondary malignant neoplasm of other specified sites: Secondary | ICD-10-CM | POA: Diagnosis not present

## 2014-11-19 LAB — CBC WITH DIFFERENTIAL/PLATELET
BASO%: 0.1 % (ref 0.0–2.0)
BASOS ABS: 0 10*3/uL (ref 0.0–0.1)
EOS%: 0.8 % (ref 0.0–7.0)
Eosinophils Absolute: 0.1 10*3/uL (ref 0.0–0.5)
HCT: 37.9 % — ABNORMAL LOW (ref 38.4–49.9)
HEMOGLOBIN: 12.7 g/dL — AB (ref 13.0–17.1)
LYMPH%: 17 % (ref 14.0–49.0)
MCH: 34.3 pg — ABNORMAL HIGH (ref 27.2–33.4)
MCHC: 33.5 g/dL (ref 32.0–36.0)
MCV: 102.4 fL — AB (ref 79.3–98.0)
MONO#: 0.8 10*3/uL (ref 0.1–0.9)
MONO%: 6.2 % (ref 0.0–14.0)
NEUT#: 10.1 10*3/uL — ABNORMAL HIGH (ref 1.5–6.5)
NEUT%: 75.9 % — AB (ref 39.0–75.0)
PLATELETS: 123 10*3/uL — AB (ref 140–400)
RBC: 3.7 10*6/uL — ABNORMAL LOW (ref 4.20–5.82)
RDW: 18.1 % — ABNORMAL HIGH (ref 11.0–14.6)
WBC: 13.3 10*3/uL — ABNORMAL HIGH (ref 4.0–10.3)
lymph#: 2.3 10*3/uL (ref 0.9–3.3)

## 2014-11-19 LAB — COMPREHENSIVE METABOLIC PANEL (CC13)
ALT: 53 U/L (ref 0–55)
ANION GAP: 8 meq/L (ref 3–11)
AST: 27 U/L (ref 5–34)
Albumin: 3.1 g/dL — ABNORMAL LOW (ref 3.5–5.0)
Alkaline Phosphatase: 99 U/L (ref 40–150)
BILIRUBIN TOTAL: 0.52 mg/dL (ref 0.20–1.20)
BUN: 22.5 mg/dL (ref 7.0–26.0)
CALCIUM: 8.7 mg/dL (ref 8.4–10.4)
CHLORIDE: 109 meq/L (ref 98–109)
CO2: 26 meq/L (ref 22–29)
Creatinine: 1.5 mg/dL — ABNORMAL HIGH (ref 0.7–1.3)
EGFR: 53 mL/min/{1.73_m2} — ABNORMAL LOW (ref >90)
Glucose: 146 mg/dl — ABNORMAL HIGH (ref 70–140)
Potassium: 4.4 mEq/L (ref 3.5–5.1)
Sodium: 143 mEq/L (ref 136–145)
Total Protein: 5.8 g/dL — ABNORMAL LOW (ref 6.4–8.3)

## 2014-11-19 LAB — UA PROTEIN, DIPSTICK - CHCC: Protein, ur: <30 mg/dL

## 2014-11-19 LAB — MAGNESIUM (CC13): Magnesium: 2.3 mg/dl (ref 1.5–2.5)

## 2014-11-19 MED ORDER — SODIUM CHLORIDE 0.9 % IV SOLN
2000.0000 mg/m2 | INTRAVENOUS | Status: DC
  Administered 2014-11-19: 4750 mg via INTRAVENOUS
  Filled 2014-11-19: qty 95

## 2014-11-19 MED ORDER — SODIUM CHLORIDE 0.9 % IV SOLN
Freq: Once | INTRAVENOUS | Status: AC
  Administered 2014-11-19: 11:00:00 via INTRAVENOUS

## 2014-11-19 MED ORDER — PALONOSETRON HCL INJECTION 0.25 MG/5ML
0.2500 mg | Freq: Once | INTRAVENOUS | Status: AC
  Administered 2014-11-19: 0.25 mg via INTRAVENOUS

## 2014-11-19 MED ORDER — ATROPINE SULFATE 1 MG/ML IJ SOLN
0.5000 mg | Freq: Once | INTRAMUSCULAR | Status: AC | PRN
  Administered 2014-11-19: 0.5 mg via INTRAVENOUS

## 2014-11-19 MED ORDER — FLUOROURACIL CHEMO INJECTION 2.5 GM/50ML
320.0000 mg/m2 | Freq: Once | INTRAVENOUS | Status: AC
Start: 2014-11-19 — End: 2014-11-19
  Administered 2014-11-19: 750 mg via INTRAVENOUS
  Filled 2014-11-19: qty 15

## 2014-11-19 MED ORDER — SODIUM CHLORIDE 0.9 % IV SOLN
Freq: Once | INTRAVENOUS | Status: AC
  Administered 2014-11-19: 12:00:00 via INTRAVENOUS
  Filled 2014-11-19: qty 5

## 2014-11-19 MED ORDER — LEUCOVORIN CALCIUM INJECTION 350 MG
320.0000 mg/m2 | Freq: Once | INTRAMUSCULAR | Status: AC
  Administered 2014-11-19: 762 mg via INTRAVENOUS
  Filled 2014-11-19: qty 38.1

## 2014-11-19 MED ORDER — IRINOTECAN HCL CHEMO INJECTION 100 MG/5ML
180.0000 mg/m2 | Freq: Once | INTRAVENOUS | Status: AC
  Administered 2014-11-19: 428 mg via INTRAVENOUS
  Filled 2014-11-19: qty 21.4

## 2014-11-19 MED ORDER — PROCHLORPERAZINE MALEATE 10 MG PO TABS: ORAL_TABLET | ORAL | Status: DC

## 2014-11-19 MED ORDER — PALONOSETRON HCL INJECTION 0.25 MG/5ML
INTRAVENOUS | Status: AC
  Filled 2014-11-19: qty 5

## 2014-11-19 MED ORDER — ATROPINE SULFATE 1 MG/ML IJ SOLN
INTRAMUSCULAR | Status: AC
  Filled 2014-11-19: qty 1

## 2014-11-19 MED ORDER — BEVACIZUMAB CHEMO INJECTION 400 MG/16ML
5.0000 mg/kg | Freq: Once | INTRAVENOUS | Status: AC
  Administered 2014-11-19: 575 mg via INTRAVENOUS
  Filled 2014-11-19: qty 23

## 2014-11-19 NOTE — Progress Notes (Signed)
[place] OFFICE PROGRESS NOTE   Diagnosis:  Colon cancer  INTERVAL HISTORY:   Bryan Fields returns as scheduled. He completed cycle 6 FOLFIRI/Avastin 11/06/2014. He denies nausea/vomiting. No mouth sores. He developed loose stools and crampy lower abdominal pain about 1 week after the most recent cycle. The loose stools have resolved. He continues to note crampy lower abdominal pain. He notes progressive fatigue. He continues to have bleeding with nose blowing. No shortness of breath or chest pain. No leg swelling or calf pain.  Objective:  Vital signs in last 24 hours:  Blood pressure 140/78, pulse 64, temperature 98.2 F (36.8 C), temperature source Oral, resp. rate 20, height $RemoveBe'5\' 11"'defmEOwjH$  (1.803 m), weight 246 lb 3.2 oz (111.676 kg), SpO2 99 %.    HEENT: No thrush or ulcers. Resp: Lungs clear bilaterally. Cardio: Regular rate and rhythm. GI: No hepatomegaly. Nontender. Abdominal wall mass measures approximately 7-7 1/2 cm in the transverse dimension and 6 cm in the vertical dimension. Vascular: No leg edema. Port-A-Cath without erythema.   Lab Results:  Lab Results  Component Value Date   WBC 13.3* 11/19/2014   HGB 12.7* 11/19/2014   HCT 37.9* 11/19/2014   MCV 102.4* 11/19/2014   PLT 123* 11/19/2014   NEUTROABS 10.1* 11/19/2014    Imaging:  No results found.  Medications: I have reviewed the patient's current medications.  Assessment/Plan: 1. 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 Microsatellite stable; preserved expression of major and minor MMR proteins. He began treatment with FOLFOX in December 2011. He completed cycle #12 on 10/27/2010. Oxaliplatin was held with cycles number 7 through 10 and resumed with cycle #11. 2. History of neutropenia secondary to chemotherapy. 3. History of thrombocytopenia secondary chemotherapy. 4. He did not  undergo a preoperative colonoscopy. He underwent a colonoscopy on 12/19/2010 with findings of a 1 cm polyp at 90 cm from the anal verge, minimal polyp at 30 cm from the anal verge and minimal diverticulosis. Colonoscopy 03/18/2012-negative. 5. 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 He began study drug on 05/13/2010. 6. Status post Port-A-Cath placement. The Port-A-Cath has been removed. 7. 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 9. Low attenuation lesion in the caudate lobe of the liver on the CT 04/11/2012-? Cyst. MRI liver on 04/20/2012 favored the caudate lobe liver lesion to represent a cyst or complex cyst. CT abdomen/pelvis 04/17/2013 with continued enlargement of hypovascular lesion in the caudate lobe of the liver.  PET scan 10/27/2011 confirmed a hypermetabolic caudate lobe liver lesion, tiny indeterminate lung nodules, and a 9 mm level II left neck node   CT biopsy of the liver lesion 11/07/2013 confirmed adenocarcinoma  Left liver and caudate resection 01/15/2014-pathology confirmed metastatic colon cancer, and negative surgical margins  Surveillance CT scans 07/24/2014 consistent with recurrent disease involving upper abdominal lymph nodes, peritoneal implants, and an anterior abdominal wall mass  Status post biopsy of the anterior abdominal wall mass 08/06/2014 with pathology showing metastatic adenocarcinoma consistent with a colon primary.  UGT1A1 1/28  Cycle 1 FOLFIRI/Avastin on the [facility] 1317 08/20/2014  Cycle 2 FOLFIRI/Avastin 09/03/2014  Cycle 3 held on 09/17/2014 due to neutropenia  Cycle 3 FOLFIRI/Avastin 09/25/2014. Neulasta added beginning with cycle 3.  Cycle 4 FOLFIRI/Avastin 10/08/2014  Restaging CT scans 10/18/2014 with slight  interval increase in the size of the soft tissue mass in  the subcutaneous fat of the epigastric region. Underlying peritoneal implants, probable focus of residual disease along the resected margin of the liver and hepatoduodenal ligament lymphadenopathy all appeared slightly improved. No new sites of metastatic disease otherwise noted. New left lower lobe bronchopneumonia.  Cycle 5 FOLFIRI/Avastin 10/22/2014  Cycle 6 FOLFIRI/Avastin 11/06/2014  Cycle 7 FOLFIRI Avastin 11/19/2014 10. Iron deposition within the liver noted on MRI 04/20/2012. The ferritin level was normal on 04/17/2013. Increased iron deposition noted on the left liver resection 01/15/2014 11. Pain/bleeding at the anus reported when here 04/24/2013. Status post evaluation by Dr. [name] with findings of an anal fissure. 12. Right face cystic lesion 13. Status post Port-A-Cath placement 08/14/2014 14. Delayed nausea following FOLFIRI, prophylactic Decadron added with cycle 2 with improvement. 15. Chest CT 10/18/2014 with new left lower lobe bronchopneumonia. Levaquin initiated.   Disposition: Bryan Fields appears stable. He has completed 6 cycles of FOLFIRI/Avastin. Plan to proceed with cycle 7 today as scheduled.  The creatinine was mildly elevated on labs today. He will increase fluid intake.  He understands to contact the office with worsening abdominal pain, nausea/vomiting, change in bowel habits.  He will return for a follow-up visit and cycle 8 FOLFIRI/Avastin in 2 weeks.  ECOG performance status measured at 1.  Plan reviewed with Dr. Benay Spice.    Ned Card ANP/GNP-BC   11/19/2014  9:54 AM

## 2014-11-19 NOTE — Patient Instructions (Signed)
[facility] Discharge Instructions for Patients Receiving Chemotherapy  Today you received the following chemotherapy agents: Avastin, Irinotecan, Leucovorin, Adrucil   To help prevent nausea and vomiting after your treatment, we encourage you to take your nausea medication as directed.    If you develop nausea and vomiting that is not controlled by your nausea medication, call the clinic.   BELOW ARE SYMPTOMS THAT SHOULD BE REPORTED IMMEDIATELY:  *FEVER GREATER THAN 100.5 F  *CHILLS WITH OR WITHOUT FEVER  NAUSEA AND VOMITING THAT IS NOT CONTROLLED WITH YOUR NAUSEA MEDICATION  *UNUSUAL SHORTNESS OF BREATH  *UNUSUAL BRUISING OR BLEEDING  TENDERNESS IN MOUTH AND THROAT WITH OR WITHOUT PRESENCE OF ULCERS  *URINARY PROBLEMS  *BOWEL PROBLEMS  UNUSUAL RASH Items with * indicate a potential emergency and should be followed up as soon as possible.  Feel free to call the clinic you have any questions or concerns. The clinic phone number is [phone].  Please show the CHEMO ALERT CARD at check-in to the Emergency Department and triage nurse.   

## 2014-11-19 NOTE — Progress Notes (Signed)
[id number]: Cycle 4/day 1 Met with patient and spouse in exam room for Cycle 4 day 1 treatment. Patient met with NP, Ned Card today. Patient reports no new medications. Patient reports fatigue lasting longer than previous treatments,  but continues to be able to perform his usual daily activities. Patient also reports "some loose bowels" that started five days ago and occurred only once, in the morning for 3 to 4 days (now resolved), along with "cramping" in lower abdomen that he rates as a 1-2 on a pain scale of 10 and reports not needing to take any pain medications for it, but has continued daily since the start of it. Patient denies nausea and vomiting, no further issues with bowels, or bladder, no SOB, no chest pain, no leg or calf pain/swelling, no mouth sores. Per NP, no changes in size to external abdominal mass. Patient continues with small amount blood tinged nasal discharge upon blowing of nose. Dr. Benay Spice reviewed patient's labs, mild elevation in creatine levels noted, per MD, patient cleared for treatment today. Patient encouraged to call clinic with any questions/concerns, worsening of abdominal pain, N/V or change in bowels and was educated on increasing fluid intake, patient gave verbal understanding. Infusion sign given and explained  to [place]. Patient to return for pump d/c 06/15. Adele Dan, RN, Clinical Research 11/19/2014 12:08 PM

## 2014-11-19 NOTE — Progress Notes (Signed)
Treatment parameters verified with Rubin Payor, Research RN. OK to proceed. Protocol sheet reviewed in infusion room.

## 2014-11-19 NOTE — Telephone Encounter (Signed)
per pof to sch pt apppt-sent MW emailt o sch pt trmt-gave pt avs-adv [facility] will calll to sch scan

## 2014-11-21 ENCOUNTER — Ambulatory Visit (HOSPITAL_BASED_OUTPATIENT_CLINIC_OR_DEPARTMENT_OTHER): Payer: 59

## 2014-11-21 ENCOUNTER — Ambulatory Visit: Payer: 59

## 2014-11-21 VITALS — BP 128/85 | HR 72 | Temp 98.6°F

## 2014-11-21 DIAGNOSIS — C182 Malignant neoplasm of ascending colon: Secondary | ICD-10-CM

## 2014-11-21 DIAGNOSIS — C189 Malignant neoplasm of colon, unspecified: Secondary | ICD-10-CM

## 2014-11-21 DIAGNOSIS — Z5189 Encounter for other specified aftercare: Secondary | ICD-10-CM

## 2014-11-21 MED ORDER — SODIUM CHLORIDE 0.9 % IJ SOLN
10.0000 mL | INTRAMUSCULAR | Status: DC | PRN
  Administered 2014-11-21: 10 mL
  Filled 2014-11-21: qty 10

## 2014-11-21 MED ORDER — PEGFILGRASTIM INJECTION 6 MG/0.6ML ~~LOC~~
6.0000 mg | PREFILLED_SYRINGE | Freq: Once | SUBCUTANEOUS | Status: AC
  Administered 2014-11-21: 6 mg via SUBCUTANEOUS
  Filled 2014-11-21: qty 0.6

## 2014-11-21 MED ORDER — HEPARIN SOD (PORK) LOCK FLUSH 100 UNIT/ML IV SOLN
500.0000 [IU] | Freq: Once | INTRAVENOUS | Status: AC | PRN
  Administered 2014-11-21: 500 [IU]
  Filled 2014-11-21: qty 5

## 2014-11-21 NOTE — Progress Notes (Signed)
Port flush by injection nurse

## 2014-11-26 ENCOUNTER — Other Ambulatory Visit: Payer: Self-pay | Admitting: Oncology

## 2014-11-26 ENCOUNTER — Other Ambulatory Visit: Payer: Self-pay | Admitting: Family Medicine

## 2014-11-29 ENCOUNTER — Other Ambulatory Visit: Payer: Self-pay | Admitting: Family Medicine

## 2014-12-03 ENCOUNTER — Other Ambulatory Visit (HOSPITAL_BASED_OUTPATIENT_CLINIC_OR_DEPARTMENT_OTHER): Payer: 59

## 2014-12-03 ENCOUNTER — Ambulatory Visit (HOSPITAL_BASED_OUTPATIENT_CLINIC_OR_DEPARTMENT_OTHER): Payer: 59

## 2014-12-03 ENCOUNTER — Other Ambulatory Visit: Payer: Self-pay | Admitting: Medical Oncology

## 2014-12-03 ENCOUNTER — Encounter: Payer: Self-pay | Admitting: Medical Oncology

## 2014-12-03 ENCOUNTER — Ambulatory Visit (HOSPITAL_BASED_OUTPATIENT_CLINIC_OR_DEPARTMENT_OTHER): Payer: 59 | Admitting: Oncology

## 2014-12-03 VITALS — BP 136/79 | HR 69 | Temp 98.5°F | Resp 19 | Ht 71.0 in | Wt 249.1 lb

## 2014-12-03 DIAGNOSIS — Z006 Encounter for examination for normal comparison and control in clinical research program: Secondary | ICD-10-CM

## 2014-12-03 DIAGNOSIS — Z5112 Encounter for antineoplastic immunotherapy: Secondary | ICD-10-CM

## 2014-12-03 DIAGNOSIS — C182 Malignant neoplasm of ascending colon: Secondary | ICD-10-CM | POA: Diagnosis not present

## 2014-12-03 DIAGNOSIS — C189 Malignant neoplasm of colon, unspecified: Secondary | ICD-10-CM

## 2014-12-03 DIAGNOSIS — C7989 Secondary malignant neoplasm of other specified sites: Secondary | ICD-10-CM | POA: Diagnosis not present

## 2014-12-03 DIAGNOSIS — C787 Secondary malignant neoplasm of liver and intrahepatic bile duct: Secondary | ICD-10-CM | POA: Diagnosis not present

## 2014-12-03 DIAGNOSIS — K521 Toxic gastroenteritis and colitis: Secondary | ICD-10-CM

## 2014-12-03 LAB — COMPREHENSIVE METABOLIC PANEL (CC13)
ALBUMIN: 3.1 g/dL — AB (ref 3.5–5.0)
ALK PHOS: 89 U/L (ref 40–150)
ALT: 59 U/L — ABNORMAL HIGH (ref 0–55)
AST: 26 U/L (ref 5–34)
Anion Gap: 6 mEq/L (ref 3–11)
BUN: 18.9 mg/dL (ref 7.0–26.0)
CO2: 26 meq/L (ref 22–29)
Calcium: 8.6 mg/dL (ref 8.4–10.4)
Chloride: 109 mEq/L (ref 98–109)
Creatinine: 1.2 mg/dL (ref 0.7–1.3)
EGFR: 64 mL/min/{1.73_m2} — ABNORMAL LOW (ref >90)
Glucose: 134 mg/dl (ref 70–140)
Potassium: 4.3 mEq/L (ref 3.5–5.1)
SODIUM: 140 meq/L (ref 136–145)
TOTAL PROTEIN: 5.5 g/dL — AB (ref 6.4–8.3)
Total Bilirubin: 0.65 mg/dL (ref 0.20–1.20)

## 2014-12-03 LAB — CBC WITH DIFFERENTIAL/PLATELET
BASO%: 0.3 % (ref 0.0–2.0)
Basophils Absolute: 0 10*3/uL (ref 0.0–0.1)
EOS%: 1 % (ref 0.0–7.0)
Eosinophils Absolute: 0.1 10*3/uL (ref 0.0–0.5)
HCT: 36.7 % — ABNORMAL LOW (ref 38.4–49.9)
HEMOGLOBIN: 12.4 g/dL — AB (ref 13.0–17.1)
LYMPH#: 1.4 10*3/uL (ref 0.9–3.3)
LYMPH%: 12.9 % — ABNORMAL LOW (ref 14.0–49.0)
MCH: 35.2 pg — AB (ref 27.2–33.4)
MCHC: 33.8 g/dL (ref 32.0–36.0)
MCV: 104.2 fL — AB (ref 79.3–98.0)
MONO#: 0.6 10*3/uL (ref 0.1–0.9)
MONO%: 5.9 % (ref 0.0–14.0)
NEUT%: 79.9 % — ABNORMAL HIGH (ref 39.0–75.0)
NEUTROS ABS: 8.4 10*3/uL — AB (ref 1.5–6.5)
Platelets: 127 10*3/uL — ABNORMAL LOW (ref 140–400)
RBC: 3.52 10*6/uL — AB (ref 4.20–5.82)
RDW: 19.1 % — ABNORMAL HIGH (ref 11.0–14.6)
WBC: 10.5 10*3/uL — ABNORMAL HIGH (ref 4.0–10.3)

## 2014-12-03 LAB — UA PROTEIN, DIPSTICK - CHCC: PROTEIN: 30 mg/dL

## 2014-12-03 LAB — MAGNESIUM (CC13): Magnesium: 1.9 mg/dl (ref 1.5–2.5)

## 2014-12-03 MED ORDER — SODIUM CHLORIDE 0.9 % IV SOLN
Freq: Once | INTRAVENOUS | Status: AC
  Administered 2014-12-03: 12:00:00 via INTRAVENOUS
  Filled 2014-12-03: qty 5

## 2014-12-03 MED ORDER — DEXTROSE 5 % IV SOLN
320.0000 mg/m2 | Freq: Once | INTRAVENOUS | Status: AC
  Administered 2014-12-03: 762 mg via INTRAVENOUS
  Filled 2014-12-03: qty 38.1

## 2014-12-03 MED ORDER — BEVACIZUMAB CHEMO INJECTION 400 MG/16ML
5.0000 mg/kg | Freq: Once | INTRAVENOUS | Status: AC
  Administered 2014-12-03: 575 mg via INTRAVENOUS
  Filled 2014-12-03: qty 23

## 2014-12-03 MED ORDER — FLUOROURACIL CHEMO INJECTION 2.5 GM/50ML
320.0000 mg/m2 | Freq: Once | INTRAVENOUS | Status: AC
  Administered 2014-12-03: 750 mg via INTRAVENOUS
  Filled 2014-12-03: qty 15

## 2014-12-03 MED ORDER — DEXTROSE 5 % IV SOLN
180.0000 mg/m2 | Freq: Once | INTRAVENOUS | Status: AC
  Administered 2014-12-03: 428 mg via INTRAVENOUS
  Filled 2014-12-03: qty 21.4

## 2014-12-03 MED ORDER — ATROPINE SULFATE 1 MG/ML IJ SOLN
0.5000 mg | Freq: Once | INTRAMUSCULAR | Status: AC | PRN
  Administered 2014-12-03: 0.5 mg via INTRAVENOUS

## 2014-12-03 MED ORDER — ATROPINE SULFATE 1 MG/ML IJ SOLN
INTRAMUSCULAR | Status: AC
  Filled 2014-12-03: qty 1

## 2014-12-03 MED ORDER — PALONOSETRON HCL INJECTION 0.25 MG/5ML
0.2500 mg | Freq: Once | INTRAVENOUS | Status: AC
  Administered 2014-12-03: 0.25 mg via INTRAVENOUS

## 2014-12-03 MED ORDER — PALONOSETRON HCL INJECTION 0.25 MG/5ML
INTRAVENOUS | Status: AC
Start: 2014-12-03 — End: 2014-12-03
  Filled 2014-12-03: qty 5

## 2014-12-03 MED ORDER — SODIUM CHLORIDE 0.9 % IV SOLN
Freq: Once | INTRAVENOUS | Status: AC
  Administered 2014-12-03: 12:00:00 via INTRAVENOUS

## 2014-12-03 MED ORDER — SODIUM CHLORIDE 0.9 % IV SOLN
2000.0000 mg/m2 | INTRAVENOUS | Status: DC
  Administered 2014-12-03: 4750 mg via INTRAVENOUS
  Filled 2014-12-03: qty 95

## 2014-12-03 NOTE — Progress Notes (Signed)
[id number] Cycle 4/Day 15 Patient here today for cycle 4/day 15 with lab/MD/tx appointment. Met with patient and spouse in exam room, after lab appointment, with Dr. Benay Spice this morning. Patient reports no new medication. Patient does report with continuing lower abdominal pain, rates a 1 on a scale of 10 today and describes as a dull pain. Patient reports loose stool, which starts on day 8 or 9 after treatment, 1 x per day for approximately 3 days and then resolves. Patient states abdominal pain starts after the loose stool stops and then pain will continue for approximately one week. Patient states no unusual bleeding, other than from anal fissure reported at baseline, as well as blood-tinged nasal discharged when blowing nose. Patient denies: n/v, cough, SOB. He does confirm a decrease in appetite but no weight loss, in fact, he has had a 3 pound weight gain since last visit. External abdominal mass measured with calipers and Dr. Benay Spice reports mass to be 5.8 cm (vertically) x 7 cm (horizontally). MD reviewed labs and A/E's, and patient clear for Cycle 4 of treatment today. Patient denies any new symptoms, other than reported here, questions or concerns. Confirmed with patient CT scan scheduled for July 8th and patient to see Dr. Benay Spice July 11th. Encouraged patient and spouse to call clinic with any questions or concerns they may have and thanked them for their time and continued support with study. Adele Dan, RN, Clinical Research 12/03/2014 12:02 PM

## 2014-12-03 NOTE — Patient Instructions (Signed)
[facility] Discharge Instructions for Patients Receiving Chemotherapy  Today you received the following chemotherapy agents: Avastin, Irinotecan, Leucovorin, Adrucil   To help prevent nausea and vomiting after your treatment, we encourage you to take your nausea medication as directed.    If you develop nausea and vomiting that is not controlled by your nausea medication, call the clinic.   BELOW ARE SYMPTOMS THAT SHOULD BE REPORTED IMMEDIATELY:  *FEVER GREATER THAN 100.5 F  *CHILLS WITH OR WITHOUT FEVER  NAUSEA AND VOMITING THAT IS NOT CONTROLLED WITH YOUR NAUSEA MEDICATION  *UNUSUAL SHORTNESS OF BREATH  *UNUSUAL BRUISING OR BLEEDING  TENDERNESS IN MOUTH AND THROAT WITH OR WITHOUT PRESENCE OF ULCERS  *URINARY PROBLEMS  *BOWEL PROBLEMS  UNUSUAL RASH Items with * indicate a potential emergency and should be followed up as soon as possible.  Feel free to call the clinic you have any questions or concerns. The clinic phone number is [phone].  Please show the CHEMO ALERT CARD at check-in to the Emergency Department and triage nurse.   

## 2014-12-03 NOTE — Progress Notes (Signed)
[facility] OFFICE PROGRESS NOTE   Diagnosis: Colon cancer  INTERVAL HISTORY:   Mr. Satterly returns as scheduled. He completed another cycle of FOLFIRI/Avastin 11/19/2014. He reports developing diarrhea once daily for 2 days beginning on day 8 following chemotherapy. He had low abdominal pain following each episode of diarrhea. He continues to have mild discomfort in the bilateral low abdomen. Good appetite. No symptom of thrombosis. 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  Objective:  Vital signs in last 24 hours:  Blood pressure 136/79, pulse 69, temperature 98.5 F (36.9 C), temperature source Oral, resp. rate 19, height $RemoveBe'5\' 11"'ErDUDZKDN$  (1.803 m), weight 249 lb 1.6 oz (112.991 kg), SpO2 98 %.    HEENT: No thrush or ulcers Resp: Lungs clear bilaterally Cardio: Regular rate and rhythm GI: No hepatomegaly, mild tenderness in the bilateral low abdomen, no mass, no apparent ascites. The upper abdominal wall mass measures 7 cm in transverse dimension and 5.8 cm in vertical dimension Vascular: No leg edema  Portacath/PICC-without erythema  Lab Results:  Lab Results  Component Value Date   WBC 10.5* 12/03/2014   HGB 12.4* 12/03/2014   HCT 36.7* 12/03/2014   MCV 104.2* 12/03/2014   PLT 127* 12/03/2014   NEUTROABS 8.4* 12/03/2014   potassium 4.3, creatinine 1.2 Urine protein-30  Lab Results  Component Value Date   CEA 0.7 10/22/2014    Medications: I have reviewed the patient's current medications.  Assessment/Plan: 1. 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 Microsatellite stable; preserved expression of major and minor MMR proteins. He began treatment with FOLFOX in December 2011. He completed cycle #12 on 10/27/2010. Oxaliplatin was held with cycles number 7 through 10 and resumed with cycle #11. 2. History of neutropenia secondary to  chemotherapy. 3. History of thrombocytopenia secondary chemotherapy. 4. He did not undergo a preoperative colonoscopy. He underwent a colonoscopy on 12/19/2010 with findings of a 1 cm polyp at 90 cm from the anal verge, minimal polyp at 30 cm from the anal verge and minimal diverticulosis. Colonoscopy 03/18/2012-negative. 5. 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 He began study drug on 05/13/2010. 6. Status post Port-A-Cath placement. The Port-A-Cath has been removed. 7. 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 9. Low attenuation lesion in the caudate lobe of the liver on the CT 04/11/2012-? Cyst. MRI liver on 04/20/2012 favored the caudate lobe liver lesion to represent a cyst or complex cyst. CT abdomen/pelvis 04/17/2013 with continued enlargement of hypovascular lesion in the caudate lobe of the liver.  PET scan 10/27/2011 confirmed a hypermetabolic caudate lobe liver lesion, tiny indeterminate lung nodules, and a 9 mm level II left neck node   CT biopsy of the liver lesion 11/07/2013 confirmed adenocarcinoma  Left liver and caudate resection 01/15/2014-pathology confirmed metastatic colon cancer, and negative surgical margins  Surveillance CT scans 07/24/2014 consistent with recurrent disease involving upper abdominal lymph nodes, peritoneal implants, and an anterior abdominal wall mass  Status post biopsy of the anterior abdominal wall mass 08/06/2014 with pathology showing metastatic adenocarcinoma consistent with a colon primary.  UGT1A1 1/28  Cycle 1 FOLFIRI/Avastin on the [facility] 1317 08/20/2014  Cycle 2 FOLFIRI/Avastin 09/03/2014  Cycle 3 held on 09/17/2014 due to neutropenia  Cycle 3 FOLFIRI/Avastin 09/25/2014. Neulasta added beginning with cycle  3.  Cycle 4 FOLFIRI/Avastin 10/08/2014  Restaging CT  scans 10/18/2014 with slight interval increase in the size of the soft tissue mass in the subcutaneous fat of the epigastric region. Underlying peritoneal implants, probable focus of residual disease along the resected margin of the liver and hepatoduodenal ligament lymphadenopathy all appeared slightly improved. No new sites of metastatic disease otherwise noted. New left lower lobe bronchopneumonia.  Cycle 5 FOLFIRI/Avastin 10/22/2014  Cycle 6 FOLFIRI/Avastin 11/06/2014  Cycle 7 FOLFIRI Avastin 11/19/2014  Cycle 8 FOLFIRI/Avastin 12/03/2014 10. Iron deposition within the liver noted on MRI 04/20/2012. The ferritin level was normal on 04/17/2013. Increased iron deposition noted on the left liver resection 01/15/2014 11. Pain/bleeding at the anus reported when here 04/24/2013. Status post evaluation by Dr. [name] with findings of an anal fissure. 12. Right face cystic lesion 13. Status post Port-A-Cath placement 08/14/2014 14. Delayed nausea following FOLFIRI, prophylactic Decadron added with cycle 2 with improvement. 15. Chest CT 10/18/2014 with new left lower lobe bronchopneumonia. Levaquin initiated.    Disposition:  His overall status appears unchanged. His ECOG performance status is measured at 1. The abdominal mass may be slightly smaller today.  I suspect the diarrhea and abdominal discomfort are related to toxicity from chemotherapy. He will contact us if this progresses following the current cycle of chemotherapy. Mr. Bussey will be scheduled for a restaging CT evaluation prior to an office visit in 2 weeks.  Betsy Coder, MD  12/03/2014  10:49 AM

## 2014-12-05 ENCOUNTER — Ambulatory Visit: Payer: 59

## 2014-12-05 ENCOUNTER — Ambulatory Visit (HOSPITAL_BASED_OUTPATIENT_CLINIC_OR_DEPARTMENT_OTHER): Payer: 59

## 2014-12-05 VITALS — BP 142/77 | HR 77 | Temp 97.7°F | Resp 20

## 2014-12-05 DIAGNOSIS — C182 Malignant neoplasm of ascending colon: Secondary | ICD-10-CM

## 2014-12-05 DIAGNOSIS — Z006 Encounter for examination for normal comparison and control in clinical research program: Secondary | ICD-10-CM

## 2014-12-05 DIAGNOSIS — C189 Malignant neoplasm of colon, unspecified: Secondary | ICD-10-CM

## 2014-12-05 DIAGNOSIS — Z5189 Encounter for other specified aftercare: Secondary | ICD-10-CM

## 2014-12-05 MED ORDER — PEGFILGRASTIM INJECTION 6 MG/0.6ML ~~LOC~~
6.0000 mg | PREFILLED_SYRINGE | Freq: Once | SUBCUTANEOUS | Status: AC
  Administered 2014-12-05: 6 mg via SUBCUTANEOUS
  Filled 2014-12-05: qty 0.6

## 2014-12-05 MED ORDER — SODIUM CHLORIDE 0.9 % IJ SOLN
10.0000 mL | INTRAMUSCULAR | Status: DC | PRN
  Administered 2014-12-05: 10 mL
  Filled 2014-12-05: qty 10

## 2014-12-05 MED ORDER — HEPARIN SOD (PORK) LOCK FLUSH 100 UNIT/ML IV SOLN
500.0000 [IU] | Freq: Once | INTRAVENOUS | Status: AC | PRN
  Administered 2014-12-05: 500 [IU]
  Filled 2014-12-05: qty 5

## 2014-12-05 NOTE — Patient Instructions (Signed)
Pegfilgrastim injection What is this medicine? PEGFILGRASTIM (peg fil GRA stim) is a long-acting granulocyte colony-stimulating factor that stimulates the growth of neutrophils, a type of white blood cell important in the body's fight against infection. It is used to reduce the incidence of fever and infection in patients with certain types of cancer who are receiving chemotherapy that affects the bone marrow. This medicine may be used for other purposes; ask your health care provider or pharmacist if you have questions. COMMON BRAND NAME(S): Neulasta What should I tell my health care provider before I take this medicine? They need to know if you have any of these conditions: -latex allergy -ongoing radiation therapy -sickle cell disease -skin reactions to acrylic adhesives (On-Body Injector only) -an unusual or allergic reaction to pegfilgrastim, filgrastim, other medicines, foods, dyes, or preservatives -pregnant or trying to get pregnant -breast-feeding How should I use this medicine? This medicine is for injection under the skin. If you get this medicine at home, you will be taught how to prepare and give the pre-filled syringe or how to use the On-body Injector. Refer to the patient Instructions for Use for detailed instructions. Use exactly as directed. Take your medicine at regular intervals. Do not take your medicine more often than directed. It is important that you put your used needles and syringes in a special sharps container. Do not put them in a trash can. If you do not have a sharps container, call your pharmacist or healthcare provider to get one. Talk to your pediatrician regarding the use of this medicine in children. Special care may be needed. Overdosage: If you think you have taken too much of this medicine contact a poison control center or emergency room at once. NOTE: This medicine is only for you. Do not share this medicine with others. What if I miss a dose? It is  important not to miss your dose. Call your doctor or health care professional if you miss your dose. If you miss a dose due to an On-body Injector failure or leakage, a new dose should be administered as soon as possible using a single prefilled syringe for manual use. What may interact with this medicine? Interactions have not been studied. Give your health care provider a list of all the medicines, herbs, non-prescription drugs, or dietary supplements you use. Also tell them if you smoke, drink alcohol, or use illegal drugs. Some items may interact with your medicine. This list may not describe all possible interactions. Give your health care provider a list of all the medicines, herbs, non-prescription drugs, or dietary supplements you use. Also tell them if you smoke, drink alcohol, or use illegal drugs. Some items may interact with your medicine. What should I watch for while using this medicine? You may need blood work done while you are taking this medicine. If you are going to need a MRI, CT scan, or other procedure, tell your doctor that you are using this medicine (On-Body Injector only). What side effects may I notice from receiving this medicine? Side effects that you should report to your doctor or health care professional as soon as possible: -allergic reactions like skin rash, itching or hives, swelling of the face, lips, or tongue -dizziness -fever -pain, redness, or irritation at site where injected -pinpoint red spots on the skin -shortness of breath or breathing problems -stomach or side pain, or pain at the shoulder -swelling -tiredness -trouble passing urine Side effects that usually do not require medical attention (report to your doctor   or health care professional if they continue or are bothersome): -bone pain -muscle pain This list may not describe all possible side effects. Call your doctor for medical advice about side effects. You may report side effects to FDA at  1-800-FDA-1088. Where should I keep my medicine? Keep out of the reach of children. Store pre-filled syringes in a refrigerator between 2 and 8 degrees C (36 and 46 degrees F). Do not freeze. Keep in carton to protect from light. Throw away this medicine if it is left out of the refrigerator for more than 48 hours. Throw away any unused medicine after the expiration date. NOTE: This sheet is a summary. It may not cover all possible information. If you have questions about this medicine, talk to your doctor, pharmacist, or health care provider.  2015, Elsevier/Gold Standard. (2013-08-24 16:14:05)  

## 2014-12-05 NOTE — Patient Instructions (Signed)
Patient pump d/c'd and and neulasta given

## 2014-12-10 ENCOUNTER — Other Ambulatory Visit: Payer: Self-pay | Admitting: Oncology

## 2014-12-14 ENCOUNTER — Ambulatory Visit (HOSPITAL_COMMUNITY)
Admission: RE | Admit: 2014-12-14 | Discharge: 2014-12-14 | Disposition: A | Discharge status: Home / Self Care | Payer: 59 | Source: Ambulatory Visit | Attending: Nurse Practitioner | Admitting: Nurse Practitioner

## 2014-12-14 DIAGNOSIS — R911 Solitary pulmonary nodule: Secondary | ICD-10-CM | POA: Insufficient documentation

## 2014-12-14 DIAGNOSIS — C189 Malignant neoplasm of colon, unspecified: Secondary | ICD-10-CM | POA: Insufficient documentation

## 2014-12-14 DIAGNOSIS — C772 Secondary and unspecified malignant neoplasm of intra-abdominal lymph nodes: Secondary | ICD-10-CM | POA: Insufficient documentation

## 2014-12-14 DIAGNOSIS — C787 Secondary malignant neoplasm of liver and intrahepatic bile duct: Secondary | ICD-10-CM | POA: Insufficient documentation

## 2014-12-14 MED ORDER — IOHEXOL 300 MG/ML  SOLN
100.0000 mL | Freq: Once | INTRAMUSCULAR | Status: AC | PRN
  Administered 2014-12-14: 100 mL via INTRAVENOUS

## 2014-12-16 ENCOUNTER — Other Ambulatory Visit: Payer: Self-pay | Admitting: Family Medicine

## 2014-12-17 ENCOUNTER — Telehealth: Payer: Self-pay | Admitting: *Deleted

## 2014-12-17 ENCOUNTER — Other Ambulatory Visit (HOSPITAL_BASED_OUTPATIENT_CLINIC_OR_DEPARTMENT_OTHER): Payer: 59

## 2014-12-17 ENCOUNTER — Encounter: Payer: Self-pay | Admitting: Medical Oncology

## 2014-12-17 ENCOUNTER — Ambulatory Visit (HOSPITAL_BASED_OUTPATIENT_CLINIC_OR_DEPARTMENT_OTHER): Payer: 59 | Admitting: Oncology

## 2014-12-17 ENCOUNTER — Other Ambulatory Visit: Payer: Self-pay | Admitting: Medical Oncology

## 2014-12-17 ENCOUNTER — Ambulatory Visit (HOSPITAL_BASED_OUTPATIENT_CLINIC_OR_DEPARTMENT_OTHER): Payer: 59

## 2014-12-17 ENCOUNTER — Telehealth: Payer: Self-pay | Admitting: Oncology

## 2014-12-17 VITALS — BP 143/75 | HR 75 | Temp 98.9°F | Resp 20 | Ht 71.0 in | Wt 245.5 lb

## 2014-12-17 VITALS — BP 129/72 | HR 75

## 2014-12-17 DIAGNOSIS — C189 Malignant neoplasm of colon, unspecified: Secondary | ICD-10-CM

## 2014-12-17 DIAGNOSIS — C7989 Secondary malignant neoplasm of other specified sites: Secondary | ICD-10-CM

## 2014-12-17 DIAGNOSIS — C182 Malignant neoplasm of ascending colon: Secondary | ICD-10-CM

## 2014-12-17 DIAGNOSIS — Z006 Encounter for examination for normal comparison and control in clinical research program: Secondary | ICD-10-CM

## 2014-12-17 DIAGNOSIS — Z5112 Encounter for antineoplastic immunotherapy: Secondary | ICD-10-CM | POA: Diagnosis not present

## 2014-12-17 DIAGNOSIS — C787 Secondary malignant neoplasm of liver and intrahepatic bile duct: Secondary | ICD-10-CM

## 2014-12-17 LAB — UA PROTEIN, DIPSTICK - CHCC: Protein, ur: 30 mg/dL

## 2014-12-17 LAB — CBC WITH DIFFERENTIAL/PLATELET
BASO%: 1.1 % (ref 0.0–2.0)
Basophils Absolute: 0.1 10*3/uL (ref 0.0–0.1)
EOS%: 1.5 % (ref 0.0–7.0)
Eosinophils Absolute: 0.1 10*3/uL (ref 0.0–0.5)
HCT: 37.6 % — ABNORMAL LOW (ref 38.4–49.9)
HGB: 12.7 g/dL — ABNORMAL LOW (ref 13.0–17.1)
LYMPH#: 0.9 10*3/uL (ref 0.9–3.3)
LYMPH%: 10.8 % — AB (ref 14.0–49.0)
MCH: 35.8 pg — ABNORMAL HIGH (ref 27.2–33.4)
MCHC: 33.8 g/dL (ref 32.0–36.0)
MCV: 106 fL — ABNORMAL HIGH (ref 79.3–98.0)
MONO#: 0.4 10*3/uL (ref 0.1–0.9)
MONO%: 5.1 % (ref 0.0–14.0)
NEUT%: 81.5 % — ABNORMAL HIGH (ref 39.0–75.0)
NEUTROS ABS: 7.1 10*3/uL — AB (ref 1.5–6.5)
Platelets: 155 10*3/uL (ref 140–400)
RBC: 3.55 10*6/uL — ABNORMAL LOW (ref 4.20–5.82)
RDW: 17.8 % — ABNORMAL HIGH (ref 11.0–14.6)
WBC: 8.7 10*3/uL (ref 4.0–10.3)

## 2014-12-17 LAB — COMPREHENSIVE METABOLIC PANEL (CC13)
ALK PHOS: 84 U/L (ref 40–150)
ALT: 38 U/L (ref 0–55)
ANION GAP: 8 meq/L (ref 3–11)
AST: 21 U/L (ref 5–34)
Albumin: 3.1 g/dL — ABNORMAL LOW (ref 3.5–5.0)
BUN: 17.6 mg/dL (ref 7.0–26.0)
CALCIUM: 8.8 mg/dL (ref 8.4–10.4)
CHLORIDE: 107 meq/L (ref 98–109)
CO2: 27 meq/L (ref 22–29)
Creatinine: 1.3 mg/dL (ref 0.7–1.3)
EGFR: 62 mL/min/{1.73_m2} — ABNORMAL LOW (ref >90)
Glucose: 150 mg/dl — ABNORMAL HIGH (ref 70–140)
POTASSIUM: 3.8 meq/L (ref 3.5–5.1)
Sodium: 141 mEq/L (ref 136–145)
TOTAL PROTEIN: 5.8 g/dL — AB (ref 6.4–8.3)
Total Bilirubin: 0.68 mg/dL (ref 0.20–1.20)

## 2014-12-17 LAB — RESEARCH LABS

## 2014-12-17 LAB — MAGNESIUM (CC13): Magnesium: 2.1 mg/dl (ref 1.5–2.5)

## 2014-12-17 MED ORDER — PALONOSETRON HCL INJECTION 0.25 MG/5ML
INTRAVENOUS | Status: AC
Start: 2014-12-17 — End: 2014-12-17
  Filled 2014-12-17: qty 5

## 2014-12-17 MED ORDER — SODIUM CHLORIDE 0.9 % IV SOLN
2000.0000 mg/m2 | INTRAVENOUS | Status: DC
  Administered 2014-12-17: 4750 mg via INTRAVENOUS
  Filled 2014-12-17: qty 95

## 2014-12-17 MED ORDER — SODIUM CHLORIDE 0.9 % IV SOLN
Freq: Once | INTRAVENOUS | Status: AC
  Administered 2014-12-17: 13:00:00 via INTRAVENOUS

## 2014-12-17 MED ORDER — LEUCOVORIN CALCIUM INJECTION 350 MG
320.0000 mg/m2 | Freq: Once | INTRAVENOUS | Status: AC
  Administered 2014-12-17: 762 mg via INTRAVENOUS
  Filled 2014-12-17: qty 38.1

## 2014-12-17 MED ORDER — ATROPINE SULFATE 1 MG/ML IJ SOLN
INTRAMUSCULAR | Status: AC
  Filled 2014-12-17: qty 1

## 2014-12-17 MED ORDER — PALONOSETRON HCL INJECTION 0.25 MG/5ML
0.2500 mg | Freq: Once | INTRAVENOUS | Status: AC
  Administered 2014-12-17: 0.25 mg via INTRAVENOUS

## 2014-12-17 MED ORDER — ALTEPLASE 2 MG IJ SOLR
2.0000 mg | Freq: Once | INTRAMUSCULAR | Status: AC | PRN
  Administered 2014-12-17: 2 mg
  Filled 2014-12-17: qty 2

## 2014-12-17 MED ORDER — FLUOROURACIL CHEMO INJECTION 2.5 GM/50ML
320.0000 mg/m2 | Freq: Once | INTRAVENOUS | Status: AC
  Administered 2014-12-17: 750 mg via INTRAVENOUS
  Filled 2014-12-17: qty 15

## 2014-12-17 MED ORDER — SODIUM CHLORIDE 0.9 % IV SOLN
5.0000 mg/kg | Freq: Once | INTRAVENOUS | Status: AC
  Administered 2014-12-17: 575 mg via INTRAVENOUS
  Filled 2014-12-17: qty 23

## 2014-12-17 MED ORDER — ATROPINE SULFATE 1 MG/ML IJ SOLN
0.5000 mg | Freq: Once | INTRAMUSCULAR | Status: AC | PRN
  Administered 2014-12-17: 0.5 mg via INTRAVENOUS

## 2014-12-17 MED ORDER — DEXTROSE 5 % IV SOLN
180.0000 mg/m2 | Freq: Once | INTRAVENOUS | Status: AC
  Administered 2014-12-17: 428 mg via INTRAVENOUS
  Filled 2014-12-17: qty 21.4

## 2014-12-17 MED ORDER — SODIUM CHLORIDE 0.9 % IV SOLN
Freq: Once | INTRAVENOUS | Status: AC
  Administered 2014-12-17: 13:00:00 via INTRAVENOUS
  Filled 2014-12-17: qty 5

## 2014-12-17 NOTE — Telephone Encounter (Signed)
Pt confirmed labs/ov per 07/11 POF, gave pt AVS and Calendar.... KJ, sent msg to add chemo

## 2014-12-17 NOTE — Telephone Encounter (Signed)
Per staff message and POF I have scheduled appts. Advised scheduler of appts. JMW  

## 2014-12-17 NOTE — Patient Instructions (Signed)
[facility] Discharge Instructions for Patients Receiving Chemotherapy  Today you received the following chemotherapy agents Camptosar, Leucovorin, 5-FU  To help prevent nausea and vomiting after your treatment, we encourage you to take your nausea medication    If you develop nausea and vomiting that is not controlled by your nausea medication, call the clinic.   BELOW ARE SYMPTOMS THAT SHOULD BE REPORTED IMMEDIATELY:  *FEVER GREATER THAN 100.5 F  *CHILLS WITH OR WITHOUT FEVER  NAUSEA AND VOMITING THAT IS NOT CONTROLLED WITH YOUR NAUSEA MEDICATION  *UNUSUAL SHORTNESS OF BREATH  *UNUSUAL BRUISING OR BLEEDING  TENDERNESS IN MOUTH AND THROAT WITH OR WITHOUT PRESENCE OF ULCERS  *URINARY PROBLEMS  *BOWEL PROBLEMS  UNUSUAL RASH Items with * indicate a potential emergency and should be followed up as soon as possible.  Feel free to call the clinic you have any questions or concerns. The clinic phone number is [phone].  Please show the CHEMO ALERT CARD at check-in to the Emergency Department and triage nurse.   

## 2014-12-17 NOTE — Progress Notes (Signed)
[facility] OFFICE PROGRESS NOTE   Diagnosis: Colon cancer  INTERVAL HISTORY:   Bryan Fields returns as scheduled. He completed another cycle of FOLFIRI/Avastin 12/03/2014. He reports increased malaise following chemotherapy. He has low abdominal pain that began a few days following chemotherapy. The pain is relieved with naproxen. No nausea/vomiting. Minimal diarrhea. Good appetite.   Objective:  Vital signs in last 24 hours:  Blood pressure 143/75, pulse 75, temperature 98.9 F (37.2 C), temperature source Oral, resp. rate 20, height $RemoveBe'5\' 11"'DMCbFfbqs$  (1.803 m), weight 245 lb 8 oz (111.358 kg), SpO2 100 %.    HEENT: No thrush or ulcers Resp: Lungs clear bilaterally Cardio: Regular rate and rhythm GI: No hepatomegaly, no apparent ascites, nontender. Upper abdominal wall mass measures 6 cm in transverse and 6 cm in vertical dimension. No other mass. Vascular: No leg edema  Portacath/PICC-without erythema  Lab Results:  Lab Results  Component Value Date   WBC 8.7 12/17/2014   HGB 12.7* 12/17/2014   HCT 37.6* 12/17/2014   MCV 106.0* 12/17/2014   PLT 155 12/17/2014   NEUTROABS 7.1* 12/17/2014      Lab Results  Component Value Date   CEA 0.7 10/22/2014    Imaging:  Ct Chest W Contrast  12/14/2014   CLINICAL DATA:  Subsequent encounter for colon cancer with liver and abdominal metastases. Prior partial hepatectomy.  EXAM: CT CHEST, ABDOMEN, AND PELVIS WITH CONTRAST  TECHNIQUE: Multidetector CT imaging of the chest, abdomen and pelvis was performed following the standard protocol during bolus administration of intravenous contrast.  CONTRAST:  12mL OMNIPAQUE IOHEXOL 300 MG/ML  SOLN  COMPARISON:  10/18/2014.  FINDINGS: (Although recist measurements were not requested on this study, target lesions are present here, just in case)  RECIST 1.1  Target Lesions:  1. Subcutaneous mass anterior abdominal wall epigastric region is 5.8 cm today compared to 6.0 cm previously. 2.  Peritoneal implant deep to anterior abdominal wall musculature in epigastric region is 2.8 cm today compared to 3.4 cm previously. 3. Left upper quadrant peritoneal implant (image 62 series 2) is 1.9 cm today compared to 2.0 cm previously. 4. Abnormal soft tissue nodule along resection margin of left liver is 3.4 cm today compared to 3.3 cm previously. 5. Hepatoduodenal ligament lymph node is 15 mm short axis today compared to 19 mm previously. Non-target Lesions:  1. None. CT CHEST FINDINGS  Mediastinum / Lymph Nodes: Left-sided Port-A-Cath tip is in the proximal SVC, just distal to the innominate vein confluence. There is no axillary lymphadenopathy. No mediastinal or hilar lymphadenopathy. Heart size is normal. No pericardial effusion.  Lungs / Pleura: 4 mm right lower lobe pulmonary nodule (image 31 series 4) is stable in the interval. Airspace disease in the left lung base on the previous exam has resolved in the interval. Today's exam, there are tiny bilateral pulmonary nodules scattered in the lungs bilaterally, most of which were present previously and are unchanged and others of which are new (image 21 series for left upper lobe). No pleural effusion.  MSK / Soft Tissues: Bone windows reveal no worrisome lytic or sclerotic osseous lesions.  CT ABDOMEN AND PELVIS FINDINGS.  Hepatobiliary: Patient is status post left hepatectomy. 3.3 x 2.5 cm nodule along the liver resection margin measures 3.4 x 2.2 cm today. No evidence for focal abnormality within the remaining liver parenchyma.  Pancreas: The appendix is normal without ductal dilatation or focal mass lesion.  Spleen: No splenomegaly. No focal mass lesion.  Adrenals/Urinary Tract: No  adrenal nodule or mass. No hydroureteronephrosis. No enhancing renal lesion. Urinary bladder has normal imaging features.  Stomach/Bowel: Stomach is nondistended. No gastric wall thickening. No evidence of outlet obstruction. Duodenum is normally positioned as is the ligament  of Treitz. No small bowel wall thickening. No small bowel dilatation. Patient is status post right hemicolectomy. Remaining portions of the colon are normal in appearance.  Vascular/Lymphatic: There is abdominal aortic atherosclerosis without aneurysm. No lymphadenopathy in the abdomen. No pelvic sidewall lymphadenopathy.  Reproductive: Prostate gland and seminal vesicles are unremarkable.  Other: No intraperitoneal free fluid.  Musculoskeletal: Bone windows reveal no worrisome lytic or sclerotic osseous lesions.  IMPRESSION: No substantial interval change. The soft tissue nodule along the resection margin of the liver is stable to minimally progressed in the interval. Other areas of disease show very slight interval decrease in size. No definite new or progressive lesions are evident.  Tiny new left upper lobe pulmonary nodule may be postinfectious/inflammatory and can be reassessed at the time of followup.   Electronically Signed   By: Misty Stanley M.D.   On: 12/14/2014 14:06   Ct Abdomen Pelvis W Contrast  12/14/2014   CLINICAL DATA:  Subsequent encounter for colon cancer with liver and abdominal metastases. Prior partial hepatectomy.  EXAM: CT CHEST, ABDOMEN, AND PELVIS WITH CONTRAST  TECHNIQUE: Multidetector CT imaging of the chest, abdomen and pelvis was performed following the standard protocol during bolus administration of intravenous contrast.  CONTRAST:  170mL OMNIPAQUE IOHEXOL 300 MG/ML  SOLN  COMPARISON:  10/18/2014.  FINDINGS: (Although recist measurements were not requested on this study, target lesions are present here, just in case)  RECIST 1.1  Target Lesions:  1. Subcutaneous mass anterior abdominal wall epigastric region is 5.8 cm today compared to 6.0 cm previously. 2. Peritoneal implant deep to anterior abdominal wall musculature in epigastric region is 2.8 cm today compared to 3.4 cm previously. 3. Left upper quadrant peritoneal implant (image 62 series 2) is 1.9 cm today compared to 2.0 cm  previously. 4. Abnormal soft tissue nodule along resection margin of left liver is 3.4 cm today compared to 3.3 cm previously. 5. Hepatoduodenal ligament lymph node is 15 mm short axis today compared to 19 mm previously. Non-target Lesions:  1. None. CT CHEST FINDINGS  Mediastinum / Lymph Nodes: Left-sided Port-A-Cath tip is in the proximal SVC, just distal to the innominate vein confluence. There is no axillary lymphadenopathy. No mediastinal or hilar lymphadenopathy. Heart size is normal. No pericardial effusion.  Lungs / Pleura: 4 mm right lower lobe pulmonary nodule (image 31 series 4) is stable in the interval. Airspace disease in the left lung base on the previous exam has resolved in the interval. Today's exam, there are tiny bilateral pulmonary nodules scattered in the lungs bilaterally, most of which were present previously and are unchanged and others of which are new (image 21 series for left upper lobe). No pleural effusion.  MSK / Soft Tissues: Bone windows reveal no worrisome lytic or sclerotic osseous lesions.  CT ABDOMEN AND PELVIS FINDINGS.  Hepatobiliary: Patient is status post left hepatectomy. 3.3 x 2.5 cm nodule along the liver resection margin measures 3.4 x 2.2 cm today. No evidence for focal abnormality within the remaining liver parenchyma.  Pancreas: The appendix is normal without ductal dilatation or focal mass lesion.  Spleen: No splenomegaly. No focal mass lesion.  Adrenals/Urinary Tract: No adrenal nodule or mass. No hydroureteronephrosis. No enhancing renal lesion. Urinary bladder has normal imaging features.  Stomach/Bowel:  Stomach is nondistended. No gastric wall thickening. No evidence of outlet obstruction. Duodenum is normally positioned as is the ligament of Treitz. No small bowel wall thickening. No small bowel dilatation. Patient is status post right hemicolectomy. Remaining portions of the colon are normal in appearance.  Vascular/Lymphatic: There is abdominal aortic  atherosclerosis without aneurysm. No lymphadenopathy in the abdomen. No pelvic sidewall lymphadenopathy.  Reproductive: Prostate gland and seminal vesicles are unremarkable.  Other: No intraperitoneal free fluid.  Musculoskeletal: Bone windows reveal no worrisome lytic or sclerotic osseous lesions.  IMPRESSION: No substantial interval change. The soft tissue nodule along the resection margin of the liver is stable to minimally progressed in the interval. Other areas of disease show very slight interval decrease in size. No definite new or progressive lesions are evident.  Tiny new left upper lobe pulmonary nodule may be postinfectious/inflammatory and can be reassessed at the time of followup.   Electronically Signed   By: Misty Stanley M.D.   On: 12/14/2014 14:06   CT images reviewed with Bryan Fields  Medications: I have reviewed the patient's current medications.  Assessment/Plan: 1. 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 Microsatellite stable; preserved expression of major and minor MMR proteins. He began treatment with FOLFOX in December 2011. He completed cycle #12 on 10/27/2010. Oxaliplatin was held with cycles number 7 through 10 and resumed with cycle #11. 2. History of neutropenia secondary to chemotherapy. 3. History of thrombocytopenia secondary chemotherapy. 4. He did not undergo a preoperative colonoscopy. He underwent a colonoscopy on 12/19/2010 with findings of a 1 cm polyp at 90 cm from the anal verge, minimal polyp at 30 cm from the anal verge and minimal diverticulosis. Colonoscopy 03/18/2012-negative. 5. 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 He began study drug on 05/13/2010. 6. Status post Port-A-Cath placement. The Port-A-Cath has been removed. 7. History of pain with chewing following chemotherapy, likely a manifestation of oxaliplatin  neuropathy. Resolved.  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 9. Low attenuation lesion in the caudate lobe of the liver on the CT 04/11/2012-? Cyst. MRI liver on 04/20/2012 favored the caudate lobe liver lesion to represent a cyst or complex cyst. CT abdomen/pelvis 04/17/2013 with continued enlargement of hypovascular lesion in the caudate lobe of the liver.  PET scan 10/27/2011 confirmed a hypermetabolic caudate lobe liver lesion, tiny indeterminate lung nodules, and a 9 mm level II left neck node   CT biopsy of the liver lesion 11/07/2013 confirmed adenocarcinoma  Left liver and caudate resection 01/15/2014-pathology confirmed metastatic colon cancer, and negative surgical margins  Surveillance CT scans 07/24/2014 consistent with recurrent disease involving upper abdominal lymph nodes, peritoneal implants, and an anterior abdominal wall mass  Status post biopsy of the anterior abdominal wall mass 08/06/2014 with pathology showing metastatic adenocarcinoma consistent with a colon primary.  UGT1A1 1/28  Cycle 1 FOLFIRI/Avastin on the [facility] 1317 08/20/2014  Cycle 2 FOLFIRI/Avastin 09/03/2014  Cycle 3 held on 09/17/2014 due to neutropenia  Cycle 3 FOLFIRI/Avastin 09/25/2014. Neulasta added beginning with cycle 3.  Cycle 4 FOLFIRI/Avastin 10/08/2014  Restaging CT scans 10/18/2014 with slight interval increase in the size of the soft tissue mass in the subcutaneous fat of the epigastric region. Underlying peritoneal implants, probable focus of residual disease along the resected margin of the liver and hepatoduodenal ligament lymphadenopathy all appeared slightly improved. No new sites of metastatic disease  otherwise noted. New left lower lobe bronchopneumonia.  Cycle 5 FOLFIRI/Avastin 10/22/2014  Cycle 6 FOLFIRI/Avastin 11/06/2014  Cycle 7 FOLFIRI Avastin 11/19/2014  Cycle 8 FOLFIRI/Avastin 12/03/2014  CT 12/14/2014 with stable  disease 10. Iron deposition within the liver noted on MRI 04/20/2012. The ferritin level was normal on 04/17/2013. Increased iron deposition noted on the left liver resection 01/15/2014 11. Pain/bleeding at the anus reported when here 04/24/2013. Status post evaluation by Dr. [name] with findings of an anal fissure. 12. Right face cystic lesion 13. Status post Port-A-Cath placement 08/14/2014 14. Delayed nausea following FOLFIRI, prophylactic Decadron added with cycle 2 with improvement. 15. Chest CT 10/18/2014 with new left lower lobe bronchopneumonia. Levaquin initiated. Resolved on CT 12/14/2014   Disposition:  His overall status appears unchanged. He has developed increased lower abdominal pain. No physical exam or CT finding to explain the pain. He will continue naproxen as needed. The restaging CT reveals stable disease.  The plan is to continue FOLFIRI/Avastin. The next treatment will be delayed for 1 week secondary to his planned vacation.  He will return for an office visit and chemotherapy in 3 weeks. He will contact us in the interim for increased pain or new symptoms. Bryan Fields has an ECOG performance status of 1 today.  Betsy Coder, MD  12/17/2014  6:30 PM

## 2014-12-17 NOTE — Progress Notes (Signed)
[id number] Cycle 5, Day 1 Patient here today for Day 1 of Cycle 5. Met with patient and spouse in exam room for appt with Dr. Benay Spice, after labs and prior to scheduled treatment appointment. VSS and a 4 pound weight decrease since last appointment noted. Patient does report having a slight decrease in appetite. Patient is reporting an increase in fatigue after chemo treatments and fatigue lasting longer as well as muscle fatigue and pain in bilateral upper thighs, states his "legs feel like jelly." Patient continues to report a dull lower abdominal pain and rates it a 2 on a pain scale of 10. He states that he takes Aleve for the pain only and this has been helping. 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 Benay Spice, the neuropathy is attributed to prior chemo treatment. Patient started 7/8 an OTC [employer] brand cold & sinus med d/t having a sinus headache for approximately one week. He also continues with having diarrhea once a day for 2-3 days after approximately day 8 or 9 after treatment. Patient denies: nausea/vomitting mouth sores, cough, bleeding. Dr. Benay Spice reviewed patients CT scan from 07/08 with patient and informed him of stable disease. External abdominal mass measured, with calipers, by MD and approximately 6 x 6 cm. MD reviewed labs and A/E's, and patient clear for Cycle 5/day 1 treatment today. Patient informed MD that he will be going on vacation on 7/23 for a week and per MD, patient's treatment to be delayed that week and patient to be scheduled for 08/01 lab/MD/tx appts. Patient encouraged to call clinic with any questions or concerns or new/increased symptoms. Patient to call with change or increase in abdominal pain, patient gave verbal understanding of this. Treatment Infusion sign given to Kathe Becton, RN and she denies any  questions.  Patient scheduled to return 08/1 Adele Dan, Paul, Clinical Research 12/17/2014 12:02 PM

## 2014-12-18 LAB — CEA: CEA: 1.9 ng/mL (ref 0.0–5.0)

## 2014-12-19 ENCOUNTER — Ambulatory Visit: Payer: 59

## 2014-12-19 ENCOUNTER — Ambulatory Visit (HOSPITAL_BASED_OUTPATIENT_CLINIC_OR_DEPARTMENT_OTHER): Payer: 59

## 2014-12-19 VITALS — BP 142/85 | HR 83 | Temp 98.0°F

## 2014-12-19 DIAGNOSIS — Z006 Encounter for examination for normal comparison and control in clinical research program: Secondary | ICD-10-CM | POA: Diagnosis not present

## 2014-12-19 DIAGNOSIS — C182 Malignant neoplasm of ascending colon: Secondary | ICD-10-CM | POA: Diagnosis not present

## 2014-12-19 DIAGNOSIS — Z5189 Encounter for other specified aftercare: Secondary | ICD-10-CM

## 2014-12-19 DIAGNOSIS — C189 Malignant neoplasm of colon, unspecified: Secondary | ICD-10-CM

## 2014-12-19 MED ORDER — PEGFILGRASTIM INJECTION 6 MG/0.6ML ~~LOC~~
6.0000 mg | PREFILLED_SYRINGE | Freq: Once | SUBCUTANEOUS | Status: AC
  Administered 2014-12-19: 6 mg via SUBCUTANEOUS
  Filled 2014-12-19: qty 0.6

## 2014-12-19 MED ORDER — HEPARIN SOD (PORK) LOCK FLUSH 100 UNIT/ML IV SOLN
500.0000 [IU] | Freq: Once | INTRAVENOUS | Status: AC | PRN
  Administered 2014-12-19: 500 [IU]
  Filled 2014-12-19: qty 5

## 2014-12-19 MED ORDER — SODIUM CHLORIDE 0.9 % IJ SOLN
10.0000 mL | INTRAMUSCULAR | Status: DC | PRN
  Administered 2014-12-19: 10 mL
  Filled 2014-12-19: qty 10

## 2014-12-19 NOTE — Progress Notes (Signed)
Pump d/c done by injection nurse

## 2015-01-03 ENCOUNTER — Other Ambulatory Visit: Payer: Self-pay | Admitting: Medical Oncology

## 2015-01-03 DIAGNOSIS — C189 Malignant neoplasm of colon, unspecified: Secondary | ICD-10-CM

## 2015-01-07 ENCOUNTER — Ambulatory Visit (HOSPITAL_BASED_OUTPATIENT_CLINIC_OR_DEPARTMENT_OTHER): Payer: 59

## 2015-01-07 ENCOUNTER — Other Ambulatory Visit (HOSPITAL_BASED_OUTPATIENT_CLINIC_OR_DEPARTMENT_OTHER): Payer: 59

## 2015-01-07 ENCOUNTER — Encounter: Payer: Self-pay | Admitting: Medical Oncology

## 2015-01-07 ENCOUNTER — Ambulatory Visit (HOSPITAL_BASED_OUTPATIENT_CLINIC_OR_DEPARTMENT_OTHER): Payer: 59 | Admitting: Nurse Practitioner

## 2015-01-07 VITALS — BP 143/86 | HR 79 | Temp 97.5°F | Resp 19 | Ht 71.0 in | Wt 248.2 lb

## 2015-01-07 DIAGNOSIS — C182 Malignant neoplasm of ascending colon: Secondary | ICD-10-CM

## 2015-01-07 DIAGNOSIS — Z5111 Encounter for antineoplastic chemotherapy: Secondary | ICD-10-CM | POA: Diagnosis not present

## 2015-01-07 DIAGNOSIS — Z006 Encounter for examination for normal comparison and control in clinical research program: Secondary | ICD-10-CM

## 2015-01-07 DIAGNOSIS — C7989 Secondary malignant neoplasm of other specified sites: Secondary | ICD-10-CM | POA: Diagnosis not present

## 2015-01-07 DIAGNOSIS — C189 Malignant neoplasm of colon, unspecified: Secondary | ICD-10-CM

## 2015-01-07 DIAGNOSIS — C787 Secondary malignant neoplasm of liver and intrahepatic bile duct: Secondary | ICD-10-CM | POA: Diagnosis not present

## 2015-01-07 LAB — COMPREHENSIVE METABOLIC PANEL (CC13)
ALBUMIN: 3.1 g/dL — AB (ref 3.5–5.0)
ALK PHOS: 64 U/L (ref 40–150)
ALT: 45 U/L (ref 0–55)
ANION GAP: 6 meq/L (ref 3–11)
AST: 28 U/L (ref 5–34)
BUN: 13.5 mg/dL (ref 7.0–26.0)
CHLORIDE: 109 meq/L (ref 98–109)
CO2: 27 mEq/L (ref 22–29)
Calcium: 9.1 mg/dL (ref 8.4–10.4)
Creatinine: 1.2 mg/dL (ref 0.7–1.3)
EGFR: 67 mL/min/{1.73_m2} — ABNORMAL LOW (ref >90)
Glucose: 145 mg/dl — ABNORMAL HIGH (ref 70–140)
Potassium: 4.7 mEq/L (ref 3.5–5.1)
Sodium: 142 mEq/L (ref 136–145)
Total Bilirubin: 0.48 mg/dL (ref 0.20–1.20)
Total Protein: 6.3 g/dL — ABNORMAL LOW (ref 6.4–8.3)

## 2015-01-07 LAB — CBC WITH DIFFERENTIAL/PLATELET
BASO%: 0.7 % (ref 0.0–2.0)
Basophils Absolute: 0.1 10*3/uL (ref 0.0–0.1)
EOS ABS: 0.2 10*3/uL (ref 0.0–0.5)
EOS%: 2.8 % (ref 0.0–7.0)
HCT: 38.2 % — ABNORMAL LOW (ref 38.4–49.9)
HGB: 12.9 g/dL — ABNORMAL LOW (ref 13.0–17.1)
LYMPH#: 2 10*3/uL (ref 0.9–3.3)
LYMPH%: 23.1 % (ref 14.0–49.0)
MCH: 36.5 pg — ABNORMAL HIGH (ref 27.2–33.4)
MCHC: 33.9 g/dL (ref 32.0–36.0)
MCV: 107.9 fL — ABNORMAL HIGH (ref 79.3–98.0)
MONO#: 0.8 10*3/uL (ref 0.1–0.9)
MONO%: 9.6 % (ref 0.0–14.0)
NEUT%: 63.8 % (ref 39.0–75.0)
NEUTROS ABS: 5.5 10*3/uL (ref 1.5–6.5)
Platelets: 190 10*3/uL (ref 140–400)
RBC: 3.54 10*6/uL — ABNORMAL LOW (ref 4.20–5.82)
RDW: 16.8 % — ABNORMAL HIGH (ref 11.0–14.6)
WBC: 8.7 10*3/uL (ref 4.0–10.3)

## 2015-01-07 LAB — MAGNESIUM (CC13): Magnesium: 2.4 mg/dl (ref 1.5–2.5)

## 2015-01-07 LAB — UA PROTEIN, DIPSTICK - CHCC: PROTEIN: 30 mg/dL

## 2015-01-07 MED ORDER — LEUCOVORIN CALCIUM INJECTION 350 MG
240.0000 mg/m2 | Freq: Once | INTRAVENOUS | Status: AC
  Administered 2015-01-07: 572 mg via INTRAVENOUS
  Filled 2015-01-07: qty 28.6

## 2015-01-07 MED ORDER — SODIUM CHLORIDE 0.9 % IV SOLN
1600.0000 mg/m2 | INTRAVENOUS | Status: DC
  Administered 2015-01-07: 3800 mg via INTRAVENOUS
  Filled 2015-01-07: qty 76

## 2015-01-07 MED ORDER — IRINOTECAN HCL CHEMO INJECTION 100 MG/5ML
180.0000 mg/m2 | Freq: Once | INTRAVENOUS | Status: AC
  Administered 2015-01-07: 428 mg via INTRAVENOUS
  Filled 2015-01-07: qty 21.4

## 2015-01-07 MED ORDER — SODIUM CHLORIDE 0.9 % IV SOLN
5.0000 mg/kg | Freq: Once | INTRAVENOUS | Status: AC
  Administered 2015-01-07: 575 mg via INTRAVENOUS
  Filled 2015-01-07: qty 23

## 2015-01-07 MED ORDER — PALONOSETRON HCL INJECTION 0.25 MG/5ML
0.2500 mg | Freq: Once | INTRAVENOUS | Status: AC
  Administered 2015-01-07: 0.25 mg via INTRAVENOUS

## 2015-01-07 MED ORDER — ATROPINE SULFATE 1 MG/ML IJ SOLN
INTRAMUSCULAR | Status: AC
  Filled 2015-01-07: qty 1

## 2015-01-07 MED ORDER — SODIUM CHLORIDE 0.9 % IV SOLN
Freq: Once | INTRAVENOUS | Status: AC
  Administered 2015-01-07: 11:00:00 via INTRAVENOUS

## 2015-01-07 MED ORDER — ATROPINE SULFATE 1 MG/ML IJ SOLN
0.5000 mg | Freq: Once | INTRAMUSCULAR | Status: AC | PRN
  Administered 2015-01-07: 0.5 mg via INTRAVENOUS

## 2015-01-07 MED ORDER — SODIUM CHLORIDE 0.9 % IV SOLN
Freq: Once | INTRAVENOUS | Status: AC
  Administered 2015-01-07: 11:00:00 via INTRAVENOUS
  Filled 2015-01-07: qty 5

## 2015-01-07 MED ORDER — FLUOROURACIL CHEMO INJECTION 2.5 GM/50ML
240.0000 mg/m2 | Freq: Once | INTRAVENOUS | Status: AC
  Administered 2015-01-07: 550 mg via INTRAVENOUS
  Filled 2015-01-07: qty 11

## 2015-01-07 MED ORDER — PALONOSETRON HCL INJECTION 0.25 MG/5ML
INTRAVENOUS | Status: AC
  Filled 2015-01-07: qty 5

## 2015-01-07 NOTE — Patient Instructions (Signed)
[facility] Discharge Instructions for Patients Receiving Chemotherapy  Today you received the following chemotherapy agents: Avastin, Irinotecan, Leucovorin, Adrucil   To help prevent nausea and vomiting after your treatment, we encourage you to take your nausea medication as directed.    If you develop nausea and vomiting that is not controlled by your nausea medication, call the clinic.   BELOW ARE SYMPTOMS THAT SHOULD BE REPORTED IMMEDIATELY:  *FEVER GREATER THAN 100.5 F  *CHILLS WITH OR WITHOUT FEVER  NAUSEA AND VOMITING THAT IS NOT CONTROLLED WITH YOUR NAUSEA MEDICATION  *UNUSUAL SHORTNESS OF BREATH  *UNUSUAL BRUISING OR BLEEDING  TENDERNESS IN MOUTH AND THROAT WITH OR WITHOUT PRESENCE OF ULCERS  *URINARY PROBLEMS  *BOWEL PROBLEMS  UNUSUAL RASH Items with * indicate a potential emergency and should be followed up as soon as possible.  Feel free to call the clinic you have any questions or concerns. The clinic phone number is [phone].  Please show the CHEMO ALERT CARD at check-in to the Emergency Department and triage nurse.   

## 2015-01-07 NOTE — Progress Notes (Addendum)
[name] OFFICE PROGRESS NOTE   Diagnosis:  Colon cancer  INTERVAL HISTORY:   [name] returns as scheduled. He completed another cycle of FOLFIRI/Avastin 12/17/2014. He denies nausea/vomiting. No diarrhea. He noted increased fatigue following the most recent cycle of chemotherapy. Energy level is now beginning to improve. He continues to have a good appetite. He has stable mild dyspnea on exertion when climbing stairs. No chest pain. No leg swelling or calf pain. He has low abdominal pain that begins a few days after chemotherapy. The pain is relieved with ibuprofen. The pain lasts about a week. Following the last cycle of chemotherapy he noted that his gums became "swollen" and bled. He did not note any ulcers. He continues to have pain/tenderness over the maxillary sinuses. He notes blood with nose blowing.  Objective:  Vital signs in last 24 hours:  Blood pressure 143/86, pulse 79, temperature 97.5 F (36.4 C), temperature source Oral, resp. rate 19, height $RemoveBe'5\' 11"'RaUxWslzE$  (1.803 m), weight 248 lb 3.2 oz (112.583 kg), SpO2 99 %.    HEENT: No thrush or ulcers. Gums appear erythematous, edematous. Tenderness over the maxillary sinuses. Mild erythema, full appearance over the maxillary sinuses bilaterally. Resp: Lungs clear bilaterally. Cardio: Regular rate and rhythm. GI: No hepatomegaly. Nontender. Upper abdominal wall mass approximately 7 x 6 cm. Vascular: No leg edema.  Skin: Palms with scattered areas of hyperpigmentation. No erythema or skin breakdown. Port-A-Cath without erythema.    Lab Results:  Lab Results  Component Value Date   WBC 8.7 01/07/2015   HGB 12.9* 01/07/2015   HCT 38.2* 01/07/2015   MCV 107.9* 01/07/2015   PLT 190 01/07/2015   NEUTROABS 5.5 01/07/2015    Imaging:  No results found.  Medications: I have reviewed the patient's current medications.  Assessment/Plan: 1. 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 Microsatellite stable; preserved expression of major and minor MMR proteins. He began treatment with FOLFOX in December 2011. He completed cycle #12 on 10/27/2010. Oxaliplatin was held with cycles number 7 through 10 and resumed with cycle #11. 2. History of neutropenia secondary to chemotherapy. 3. History of thrombocytopenia secondary chemotherapy. 4. He did not undergo a preoperative colonoscopy. He underwent a colonoscopy on 12/19/2010 with findings of a 1 cm polyp at 90 cm from the anal verge, minimal polyp at 30 cm from the anal verge and minimal diverticulosis. Colonoscopy 03/18/2012-negative. 5. 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 He began study drug on 05/13/2010. 6. Status post Port-A-Cath placement. The Port-A-Cath has been removed. 7. 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 9. Low attenuation lesion in the caudate lobe of the liver on the CT 04/11/2012-? Cyst. MRI liver on 04/20/2012 favored the caudate lobe liver lesion to represent a cyst or complex cyst. CT abdomen/pelvis 04/17/2013 with continued enlargement of hypovascular lesion in the caudate lobe of the liver.  PET scan 10/27/2011 confirmed a hypermetabolic caudate lobe liver lesion, tiny indeterminate lung nodules, and a 9 mm level II left neck node   CT biopsy of the liver lesion 11/07/2013 confirmed adenocarcinoma  Left liver and caudate resection 01/15/2014-pathology confirmed metastatic colon cancer, and negative surgical margins  Surveillance CT scans 07/24/2014 consistent with recurrent disease involving upper abdominal lymph nodes, peritoneal implants, and an anterior abdominal wall  mass  Status post biopsy of the anterior abdominal wall mass 08/06/2014 with  pathology showing metastatic adenocarcinoma consistent with a colon primary.  UGT1A1 1/28  Cycle 1 FOLFIRI/Avastin on the [facility] 1317 08/20/2014  Cycle 2 FOLFIRI/Avastin 09/03/2014  Cycle 3 held on 09/17/2014 due to neutropenia  Cycle 3 FOLFIRI/Avastin 09/25/2014. Neulasta added beginning with cycle 3.  Cycle 4 FOLFIRI/Avastin 10/08/2014  Restaging CT scans 10/18/2014 with slight interval increase in the size of the soft tissue mass in the subcutaneous fat of the epigastric region. Underlying peritoneal implants, probable focus of residual disease along the resected margin of the liver and hepatoduodenal ligament lymphadenopathy all appeared slightly improved. No new sites of metastatic disease otherwise noted. New left lower lobe bronchopneumonia.  Cycle 5 FOLFIRI/Avastin 10/22/2014  Cycle 6 FOLFIRI/Avastin 11/06/2014  Cycle 7 FOLFIRI Avastin 11/19/2014  Cycle 8 FOLFIRI/Avastin 12/03/2014  CT 12/14/2014 with stable disease  Cycle 9 FOLFIRI/Avastin 12/17/2014  Cycle 10 FOLFIRI/Avastin 01/07/2015 10. Iron deposition within the liver noted on MRI 04/20/2012. The ferritin level was normal on 04/17/2013. Increased iron deposition noted on the left liver resection 01/15/2014 11. Pain/bleeding at the anus reported when here 04/24/2013. Status post evaluation by Dr. [name] with findings of an anal fissure. 12. Right face cystic lesion 13. Status post Port-A-Cath placement 08/14/2014 14. Delayed nausea following FOLFIRI, prophylactic Decadron added with cycle 2 with improvement. 15. Chest CT 10/18/2014 with new left lower lobe bronchopneumonia. Levaquin initiated. Resolved on CT 12/14/2014 16. Gum erythema/edema. Question mucositis. 17. Pain/tenderness, mild erythema and full appearance over the maxillary sinuses.   Disposition: [name] appears stable. He has completed 9 cycles of FOLFIRI/Avastin. Plan to proceed with cycle 10 today as scheduled.  The gum erythema/edema may be a  manifestation of mucositis related to the 5-fluorouracil. The 5-fluorouracil will be dose reduced beginning with cycle 10 today.  The etiology of the tenderness and mild erythema over the maxillary sinuses is unclear. He understands to contact the office with worsening symptoms, fever.  He will return for a follow-up visit and cycle 11 FOLFIRI/Avastin in 2 weeks. He will contact the office in the interim as outlined above or with any other problems. ECOG performance status measured at 1 today.  Patient seen with Dr. Benay Spice.  Ned Card ANP/GNP-BC   01/07/2015  10:06 AM  This was a shared visit with Ned Card. Mr. Shankman was interviewed and examined. We will dose reduce the 5-fluorouracil and leucovorin secondary to apparent mucositis. It is possible the sinus symptoms are related to chemotherapy, infection, or Avastin. We will x-ray the sinuses if his symptoms persist.  Julieanne Manson, M.D.

## 2015-01-07 NOTE — Progress Notes (Signed)
[id number]: Cycle 5/Day 15 Patient returns today for Cycle 5/Day 15 treatment. Shared visit with NP, Ned Card and Dr. Benay Spice. VS and labs completed. Patient at 248.3 lbs and maintaining weight. Met with patient and spouse in exam room, patient reports having redness, tenderness and swelling to gums with soreness and some bleeding, per MD questionable mucositis. Patient also with complaints to maxillary sinuses, tenderness to touch and skin with mild erythema. Patient reports continuing lower abdominal discomfort/pain that starts approximately the Thursday or Friday following treatment and lasts approximately a week, states ibuprofen helps with discomfort, reports pain today "very little." Patient has noted increased fatigue after treatments. Per patient, fatigue lasting longer and states that it is taking longer for him to recover, with the 1st week following treatment being the worst. Patient continues with blood tinged tissue after blowing nose. Patient denies nausea and vomiting, denies diarrhea, denies chest pain and cough. Continues with SOB with exertion. Patient denies any other new symptoms. Patient's A/E's and labs (with non-clinically significant results present) reviewed by Dr. Benay Spice, patient to receive treatment today at reduced level due to "questionable mucositis," Leucovorin and 5 FU dose reduced. All patient's and spouses questions answered to their satisfaction, deny further questions at this time. I encouraged patient to call Dr. Benay Spice or myself with any questions or concerns. Thanked patient and spouse for their continued support of study. Adele Dan, RN, BSN Clinical Research 01/07/2015 11:50 AM

## 2015-01-09 ENCOUNTER — Encounter: Payer: Self-pay | Admitting: Medical Oncology

## 2015-01-09 ENCOUNTER — Ambulatory Visit: Payer: 59

## 2015-01-09 ENCOUNTER — Ambulatory Visit (HOSPITAL_BASED_OUTPATIENT_CLINIC_OR_DEPARTMENT_OTHER): Payer: 59

## 2015-01-09 VITALS — BP 139/80 | HR 77 | Temp 98.6°F | Resp 18

## 2015-01-09 DIAGNOSIS — Z5189 Encounter for other specified aftercare: Secondary | ICD-10-CM

## 2015-01-09 DIAGNOSIS — C182 Malignant neoplasm of ascending colon: Secondary | ICD-10-CM | POA: Diagnosis not present

## 2015-01-09 DIAGNOSIS — C189 Malignant neoplasm of colon, unspecified: Secondary | ICD-10-CM

## 2015-01-09 DIAGNOSIS — Z006 Encounter for examination for normal comparison and control in clinical research program: Secondary | ICD-10-CM

## 2015-01-09 MED ORDER — PEGFILGRASTIM INJECTION 6 MG/0.6ML ~~LOC~~
6.0000 mg | PREFILLED_SYRINGE | Freq: Once | SUBCUTANEOUS | Status: AC
  Administered 2015-01-09: 6 mg via SUBCUTANEOUS
  Filled 2015-01-09: qty 0.6

## 2015-01-09 MED ORDER — SODIUM CHLORIDE 0.9 % IJ SOLN
10.0000 mL | INTRAMUSCULAR | Status: DC | PRN
  Administered 2015-01-09: 10 mL
  Filled 2015-01-09: qty 10

## 2015-01-09 MED ORDER — HEPARIN SOD (PORK) LOCK FLUSH 100 UNIT/ML IV SOLN
500.0000 [IU] | Freq: Once | INTRAVENOUS | Status: AC | PRN
  Administered 2015-01-09: 500 [IU]
  Filled 2015-01-09: qty 5

## 2015-01-09 NOTE — Progress Notes (Signed)
Nuelasta injection given by flush nurse

## 2015-01-09 NOTE — Patient Instructions (Signed)
Pegfilgrastim injection What is this medicine? PEGFILGRASTIM (peg fil GRA stim) is a long-acting granulocyte colony-stimulating factor that stimulates the growth of neutrophils, a type of white blood cell important in the body's fight against infection. It is used to reduce the incidence of fever and infection in patients with certain types of cancer who are receiving chemotherapy that affects the bone marrow. This medicine may be used for other purposes; ask your health care provider or pharmacist if you have questions. COMMON BRAND NAME(S): Neulasta What should I tell my health care provider before I take this medicine? They need to know if you have any of these conditions: -latex allergy -ongoing radiation therapy -sickle cell disease -skin reactions to acrylic adhesives (On-Body Injector only) -an unusual or allergic reaction to pegfilgrastim, filgrastim, other medicines, foods, dyes, or preservatives -pregnant or trying to get pregnant -breast-feeding How should I use this medicine? This medicine is for injection under the skin. If you get this medicine at home, you will be taught how to prepare and give the pre-filled syringe or how to use the On-body Injector. Refer to the patient Instructions for Use for detailed instructions. Use exactly as directed. Take your medicine at regular intervals. Do not take your medicine more often than directed. It is important that you put your used needles and syringes in a special sharps container. Do not put them in a trash can. If you do not have a sharps container, call your pharmacist or healthcare provider to get one. Talk to your pediatrician regarding the use of this medicine in children. Special care may be needed. Overdosage: If you think you have taken too much of this medicine contact a poison control center or emergency room at once. NOTE: This medicine is only for you. Do not share this medicine with others. What if I miss a dose? It is  important not to miss your dose. Call your doctor or health care professional if you miss your dose. If you miss a dose due to an On-body Injector failure or leakage, a new dose should be administered as soon as possible using a single prefilled syringe for manual use. What may interact with this medicine? Interactions have not been studied. Give your health care provider a list of all the medicines, herbs, non-prescription drugs, or dietary supplements you use. Also tell them if you smoke, drink alcohol, or use illegal drugs. Some items may interact with your medicine. This list may not describe all possible interactions. Give your health care provider a list of all the medicines, herbs, non-prescription drugs, or dietary supplements you use. Also tell them if you smoke, drink alcohol, or use illegal drugs. Some items may interact with your medicine. What should I watch for while using this medicine? You may need blood work done while you are taking this medicine. If you are going to need a MRI, CT scan, or other procedure, tell your doctor that you are using this medicine (On-Body Injector only). What side effects may I notice from receiving this medicine? Side effects that you should report to your doctor or health care professional as soon as possible: -allergic reactions like skin rash, itching or hives, swelling of the face, lips, or tongue -dizziness -fever -pain, redness, or irritation at site where injected -pinpoint red spots on the skin -shortness of breath or breathing problems -stomach or side pain, or pain at the shoulder -swelling -tiredness -trouble passing urine Side effects that usually do not require medical attention (report to your doctor   or health care professional if they continue or are bothersome): -bone pain -muscle pain This list may not describe all possible side effects. Call your doctor for medical advice about side effects. You may report side effects to FDA at  1-800-FDA-1088. Where should I keep my medicine? Keep out of the reach of children. Store pre-filled syringes in a refrigerator between 2 and 8 degrees C (36 and 46 degrees F). Do not freeze. Keep in carton to protect from light. Throw away this medicine if it is left out of the refrigerator for more than 48 hours. Throw away any unused medicine after the expiration date. NOTE: This sheet is a summary. It may not cover all possible information. If you have questions about this medicine, talk to your doctor, pharmacist, or health care provider.  2015, Elsevier/Gold Standard. (2013-08-24 16:14:05)  

## 2015-01-09 NOTE — Progress Notes (Signed)
[id number] Study: Met with Dr. Tery Sanfilippo to review and confirm target lesions, from start of patient's eligibility to most recent CT scan on 12/14/2014, in relation to RECIST requirements for study. Confirmed with Dr. Tery Sanfilippo, that  targeted lesions have been documented are correct and consistent. Per Dr. Tery Sanfilippo, only two of the lesions, peritoneal implant beneath the left mid abdominal wall and the implant beneath midline upper abdominal wall, are located in the same organ.  Adele Dan, RN, BSN Clinical Research 01/09/2015 4:23 PM

## 2015-01-10 ENCOUNTER — Telehealth: Payer: Self-pay | Admitting: Nurse Practitioner

## 2015-01-10 NOTE — Telephone Encounter (Signed)
Spoke with patient and he is aware of his 8/29 appointments   anne

## 2015-01-18 ENCOUNTER — Other Ambulatory Visit: Payer: Self-pay | Admitting: Medical Oncology

## 2015-01-18 DIAGNOSIS — C189 Malignant neoplasm of colon, unspecified: Secondary | ICD-10-CM

## 2015-01-20 ENCOUNTER — Other Ambulatory Visit: Payer: Self-pay | Admitting: Oncology

## 2015-01-21 ENCOUNTER — Telehealth: Payer: Self-pay | Admitting: Oncology

## 2015-01-21 ENCOUNTER — Other Ambulatory Visit (HOSPITAL_BASED_OUTPATIENT_CLINIC_OR_DEPARTMENT_OTHER): Payer: 59

## 2015-01-21 ENCOUNTER — Ambulatory Visit (HOSPITAL_BASED_OUTPATIENT_CLINIC_OR_DEPARTMENT_OTHER): Payer: 59

## 2015-01-21 ENCOUNTER — Ambulatory Visit (HOSPITAL_BASED_OUTPATIENT_CLINIC_OR_DEPARTMENT_OTHER): Payer: 59 | Admitting: Oncology

## 2015-01-21 ENCOUNTER — Encounter: Payer: Self-pay | Admitting: Medical Oncology

## 2015-01-21 VITALS — BP 134/73 | HR 68

## 2015-01-21 VITALS — BP 132/73 | HR 68 | Temp 98.4°F | Resp 18 | Ht 71.0 in | Wt 246.7 lb

## 2015-01-21 DIAGNOSIS — Z006 Encounter for examination for normal comparison and control in clinical research program: Secondary | ICD-10-CM

## 2015-01-21 DIAGNOSIS — C189 Malignant neoplasm of colon, unspecified: Secondary | ICD-10-CM

## 2015-01-21 DIAGNOSIS — C182 Malignant neoplasm of ascending colon: Secondary | ICD-10-CM

## 2015-01-21 DIAGNOSIS — Z5112 Encounter for antineoplastic immunotherapy: Secondary | ICD-10-CM | POA: Diagnosis not present

## 2015-01-21 LAB — COMPREHENSIVE METABOLIC PANEL (CC13)
ALK PHOS: 69 U/L (ref 40–150)
ALT: 41 U/L (ref 0–55)
ANION GAP: 8 meq/L (ref 3–11)
AST: 23 U/L (ref 5–34)
Albumin: 3.2 g/dL — ABNORMAL LOW (ref 3.5–5.0)
BILIRUBIN TOTAL: 0.36 mg/dL (ref 0.20–1.20)
BUN: 8.3 mg/dL (ref 7.0–26.0)
CALCIUM: 8.4 mg/dL (ref 8.4–10.4)
CO2: 26 mEq/L (ref 22–29)
Chloride: 108 mEq/L (ref 98–109)
Creatinine: 1.1 mg/dL (ref 0.7–1.3)
EGFR: 74 mL/min/{1.73_m2} — AB (ref >90)
Glucose: 125 mg/dl (ref 70–140)
Potassium: 4.1 mEq/L (ref 3.5–5.1)
Sodium: 141 mEq/L (ref 136–145)
TOTAL PROTEIN: 5.9 g/dL — AB (ref 6.4–8.3)

## 2015-01-21 LAB — CBC WITH DIFFERENTIAL/PLATELET
BASO%: 0.6 % (ref 0.0–2.0)
BASOS ABS: 0 10*3/uL (ref 0.0–0.1)
EOS ABS: 0.1 10*3/uL (ref 0.0–0.5)
EOS%: 1 % (ref 0.0–7.0)
HCT: 39.4 % (ref 38.4–49.9)
HGB: 13.1 g/dL (ref 13.0–17.1)
LYMPH%: 21.8 % (ref 14.0–49.0)
MCH: 35.6 pg — AB (ref 27.2–33.4)
MCHC: 33.2 g/dL (ref 32.0–36.0)
MCV: 107.3 fL — AB (ref 79.3–98.0)
MONO#: 0.6 10*3/uL (ref 0.1–0.9)
MONO%: 7.7 % (ref 0.0–14.0)
NEUT#: 5.1 10*3/uL (ref 1.5–6.5)
NEUT%: 68.9 % (ref 39.0–75.0)
Platelets: 219 10*3/uL (ref 140–400)
RBC: 3.67 10*6/uL — ABNORMAL LOW (ref 4.20–5.82)
RDW: 15.4 % — ABNORMAL HIGH (ref 11.0–14.6)
WBC: 7.4 10*3/uL (ref 4.0–10.3)
lymph#: 1.6 10*3/uL (ref 0.9–3.3)

## 2015-01-21 LAB — UA PROTEIN, DIPSTICK - CHCC

## 2015-01-21 LAB — MAGNESIUM (CC13): MAGNESIUM: 2.4 mg/dL (ref 1.5–2.5)

## 2015-01-21 MED ORDER — PALONOSETRON HCL INJECTION 0.25 MG/5ML
0.2500 mg | Freq: Once | INTRAVENOUS | Status: AC
  Administered 2015-01-21: 0.25 mg via INTRAVENOUS

## 2015-01-21 MED ORDER — SODIUM CHLORIDE 0.9 % IV SOLN
Freq: Once | INTRAVENOUS | Status: AC
  Administered 2015-01-21: 12:00:00 via INTRAVENOUS

## 2015-01-21 MED ORDER — IRINOTECAN HCL CHEMO INJECTION 100 MG/5ML
180.0000 mg/m2 | Freq: Once | INTRAVENOUS | Status: AC
  Administered 2015-01-21: 428 mg via INTRAVENOUS
  Filled 2015-01-21: qty 21.4

## 2015-01-21 MED ORDER — FLUOROURACIL CHEMO INJECTION 2.5 GM/50ML
240.0000 mg/m2 | Freq: Once | INTRAVENOUS | Status: AC
  Administered 2015-01-21: 550 mg via INTRAVENOUS
  Filled 2015-01-21: qty 11

## 2015-01-21 MED ORDER — PALONOSETRON HCL INJECTION 0.25 MG/5ML
INTRAVENOUS | Status: AC
  Filled 2015-01-21: qty 5

## 2015-01-21 MED ORDER — LEUCOVORIN CALCIUM INJECTION 350 MG
240.0000 mg/m2 | Freq: Once | INTRAVENOUS | Status: AC
  Administered 2015-01-21: 572 mg via INTRAVENOUS
  Filled 2015-01-21: qty 28.6

## 2015-01-21 MED ORDER — SODIUM CHLORIDE 0.9 % IV SOLN
5.0000 mg/kg | Freq: Once | INTRAVENOUS | Status: AC
  Administered 2015-01-21: 575 mg via INTRAVENOUS
  Filled 2015-01-21: qty 23

## 2015-01-21 MED ORDER — FLUOROURACIL CHEMO INJECTION 5 GM/100ML
1600.0000 mg/m2 | INTRAVENOUS | Status: DC
  Administered 2015-01-21: 3800 mg via INTRAVENOUS
  Filled 2015-01-21: qty 76

## 2015-01-21 MED ORDER — ATROPINE SULFATE 1 MG/ML IJ SOLN
0.5000 mg | Freq: Once | INTRAMUSCULAR | Status: AC | PRN
  Administered 2015-01-21: 0.5 mg via INTRAVENOUS

## 2015-01-21 MED ORDER — ATROPINE SULFATE 1 MG/ML IJ SOLN
INTRAMUSCULAR | Status: AC
  Filled 2015-01-21: qty 1

## 2015-01-21 MED ORDER — SODIUM CHLORIDE 0.9 % IV SOLN
Freq: Once | INTRAVENOUS | Status: AC
  Administered 2015-01-21: 12:00:00 via INTRAVENOUS
  Filled 2015-01-21: qty 5

## 2015-01-21 NOTE — Progress Notes (Signed)
[place] OFFICE PROGRESS NOTE   Diagnosis: Colon cancer    INTERVAL HISTORY:   Bryan Fields returns as scheduled. Completed another cycle of FOLFIRI/Avastin beginning 01/07/2015. He reports improvement in his energy level following this cycle of chemotherapy. The 5-fluorouracil and leucovorin were dose reduced.  He reports mild bleeding when he blows his nose. He continues to have discomfort at the maxillary sinus bilaterally. His chief complaint is pain in the gums with chewing. He is now limiting certain foods secondary to gum discomfort.   Objective:  Vital signs in last 24 hours:  Blood pressure 132/73, pulse 68, temperature 98.4 F (36.9 C), temperature source Oral, resp. rate 18, height $RemoveBe'5\' 11"'AlyFXNflx$  (1.803 m), weight 246 lb 11.2 oz (111.902 kg), SpO2 98 %.    HEENT: No thrush or ulcers, examination of the gums is unremarkable. No tenderness. No erythema. Resp: Lungs clear bilaterally Cardio: Regular rate and rhythm GI: No hepatomegaly, nontender, upper abdominal wall mass measures less than 7 cm in transverse dimension and less than 6 cm in vertical dimension Vascular: No leg edema  Portacath/PICC-without erythema  Lab Results:  Lab Results  Component Value Date   WBC 7.4 01/21/2015   HGB 13.1 01/21/2015   HCT 39.4 01/21/2015   MCV 107.3* 01/21/2015   PLT 219 01/21/2015   NEUTROABS 5.1 01/21/2015     Lab Results  Component Value Date   CEA 1.9 12/17/2014   Medications: I have reviewed the patient's current medications.  Assessment/Plan: 1. 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 Microsatellite stable; preserved expression of major and minor MMR proteins. He began treatment with FOLFOX in December 2011. He completed cycle #12 on 10/27/2010. Oxaliplatin was held with cycles number 7 through 10 and resumed with cycle #11. 2. History of  neutropenia secondary to chemotherapy. 3. History of thrombocytopenia secondary chemotherapy. 4. He did not undergo a preoperative colonoscopy. He underwent a colonoscopy on 12/19/2010 with findings of a 1 cm polyp at 90 cm from the anal verge, minimal polyp at 30 cm from the anal verge and minimal diverticulosis. Colonoscopy 03/18/2012-negative. 5. 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 He began study drug on 05/13/2010. 6. Status post Port-A-Cath placement. The Port-A-Cath has been removed. 7. 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 9. Low attenuation lesion in the caudate lobe of the liver on the CT 04/11/2012-? Cyst. MRI liver on 04/20/2012 favored the caudate lobe liver lesion to represent a cyst or complex cyst. CT abdomen/pelvis 04/17/2013 with continued enlargement of hypovascular lesion in the caudate lobe of the liver.  PET scan 10/27/2011 confirmed a hypermetabolic caudate lobe liver lesion, tiny indeterminate lung nodules, and a 9 mm level II left neck node   CT biopsy of the liver lesion 11/07/2013 confirmed adenocarcinoma  Left liver and caudate resection 01/15/2014-pathology confirmed metastatic colon cancer, and negative surgical margins  Surveillance CT scans 07/24/2014 consistent with recurrent disease involving upper abdominal lymph nodes, peritoneal implants, and an anterior abdominal wall mass  Status post biopsy of the anterior abdominal wall mass 08/06/2014 with pathology showing metastatic adenocarcinoma consistent with a colon primary.  UGT1A1 1/28  Cycle 1 FOLFIRI/Avastin on the [facility] 1317 08/20/2014  Cycle 2 FOLFIRI/Avastin 09/03/2014  Cycle 3 held on 09/17/2014 due to neutropenia  Cycle 3 FOLFIRI/Avastin  09/25/2014. Neulasta added beginning with cycle 3.  Cycle 4 FOLFIRI/Avastin  10/08/2014  Restaging CT scans 10/18/2014 with slight interval increase in the size of the soft tissue mass in the subcutaneous fat of the epigastric region. Underlying peritoneal implants, probable focus of residual disease along the resected margin of the liver and hepatoduodenal ligament lymphadenopathy all appeared slightly improved. No new sites of metastatic disease otherwise noted. New left lower lobe bronchopneumonia.  Cycle 5 FOLFIRI/Avastin 10/22/2014  Cycle 6 FOLFIRI/Avastin 11/06/2014  Cycle 7 FOLFIRI Avastin 11/19/2014  Cycle 8 FOLFIRI/Avastin 12/03/2014  CT 12/14/2014 with stable disease  Cycle 9 FOLFIRI/Avastin 12/17/2014  Cycle 10 FOLFIRI/Avastin 01/07/2015 (5-fluorouracil and leucovorin dose reduced)  Cycle 11 FOLFIRI/Avastin 01/21/2015 10. Iron deposition within the liver noted on MRI 04/20/2012. The ferritin level was normal on 04/17/2013. Increased iron deposition noted on the left liver resection 01/15/2014 11. Pain/bleeding at the anus reported when here 04/24/2013. Status post evaluation by Dr. [name] with findings of an anal fissure. 12. Right face cystic lesion 13. Status post Port-A-Cath placement 08/14/2014 14. Delayed nausea following FOLFIRI, prophylactic Decadron added with cycle 2 with improvement. 15. Chest CT 10/18/2014 with new left lower lobe bronchopneumonia. Levaquin initiated. Resolved on CT 12/14/2014 16. Gum pain- mucositis?, Gingivitis? 17. Pain/tenderness, mild erythema and full appearance over the maxillary sinuses.    Disposition:  Bryan Fields appears stable from an oncology standpoint. The etiology of the gum discomfort is unclear. This could be related to toxicity from chemotherapy. He has persistent sinus discomfort. We decided to proceed with a CT of the sinuses to look for evidence of an infection. He will complete another cycle of FOLFIRI/Avastin today. Bryan Fields will return for an office visit and chemotherapy in 2 weeks.  His  ECOG performance status is measured at 1 today.  We will make a dental medicine referral if he has persistent gum discomfort.  Betsy Coder, MD  01/21/2015  5:45 PM

## 2015-01-21 NOTE — Telephone Encounter (Signed)
Gave patient avs report and appointments for August and September. [facility] will call re ct - patient aware. On 02/13/15 per 8/15 pof ct/fu same day.

## 2015-01-21 NOTE — Progress Notes (Signed)
[id number]: Cycle 6/Day 1 Met with patient and spouse today in exam room. Patient scheduled today to see Dr. Benay Spice and day 1 of cycle 6 of treatment . Labs completed, resting v/s completed, O2 sat @ 98% and patient maintaining weight @ 111.9 kg today. Patient denies any new symptoms. Patient reports improvement in energy since his last treatment. States this time fatigue lasted only the first week after treatment and by the second week he was feeling less tired. Patient continues to report lower abdominal pain that lasts 3-5 days after treatment with one episode of loose stool/diarrhea those days as well, rates pain a 2 at most. Denies abdominal pain at visit. Denies bowel/bladder concerns. Patient also continues to report presence of blood when he blows his nose, but no continuous epistaxis. Patient reports nausea with this last treatment "lasted longer" and confirms antinausea medicine is working, denies vomiting. Patient confirms with continuing tenderness over sinuses and pain in his gums "worse in the morning", states he has started eating a soft food diet due to the discomfort to his gums when he chews, denies difficulty with swallowing. Patient also states he has been taking an OTC [employer] brand sinus and cold tablet every morning and states it has been helping with the tenderness in his sinuses.  Per Dr. Gearldine Shown assessment, patient with no visible mouth sores or sores to his gums, patient denies any post nasal drip. Dr. Benay Spice has ordered a head CT for further evaluation. Patient with no presence of edema. Measurement of external upper abdominal mass with calipers by Dr. Benay Spice today, approximately 6.5 cm horizontally and approximately just under 6 cm vertically.  Per Dr. Gearldine Shown assessment and patient's lab results, patient cleared to proceed with treatment of Cycle 6 today. I encouraged patient to drink 6-8 glasses of water daily as well as to add an additional Boost to his diet to ensure adequate  protein and nutritional intake. Per patient, he does drink at least 1 boost per day and occasionally 2 and will try to make sure to drink 2. All patient and spouses question answered to their satisfaction. I encouraged them both to call Dr. Benay Spice or myself with any questions or concerns and should he have any worsening symptoms to please call clinic as soon as possible. Patient gave verbal understanding.  CT of head 08/17 CT Chest/Abd/Pelvis 09/07 Adele Dan, RN, Clinical Research 01/21/2015 11:08 AM

## 2015-01-21 NOTE — Patient Instructions (Signed)
[facility] Discharge Instructions for Patients Receiving Chemotherapy  Today you received the following chemotherapy agents Avastin/5 FU/Irinotecan/Leucovorin  To help prevent nausea and vomiting after your treatment, we encourage you to take your nausea medication as prescribed.   If you develop nausea and vomiting that is not controlled by your nausea medication, call the clinic.   BELOW ARE SYMPTOMS THAT SHOULD BE REPORTED IMMEDIATELY:  *FEVER GREATER THAN 100.5 F  *CHILLS WITH OR WITHOUT FEVER  NAUSEA AND VOMITING THAT IS NOT CONTROLLED WITH YOUR NAUSEA MEDICATION  *UNUSUAL SHORTNESS OF BREATH  *UNUSUAL BRUISING OR BLEEDING  TENDERNESS IN MOUTH AND THROAT WITH OR WITHOUT PRESENCE OF ULCERS  *URINARY PROBLEMS  *BOWEL PROBLEMS  UNUSUAL RASH Items with * indicate a potential emergency and should be followed up as soon as possible.  Feel free to call the clinic you have any questions or concerns. The clinic phone number is [phone].  Please show the CHEMO ALERT CARD at check-in to the Emergency Department and triage nurse.   

## 2015-01-23 ENCOUNTER — Ambulatory Visit: Payer: 59

## 2015-01-23 ENCOUNTER — Ambulatory Visit (HOSPITAL_BASED_OUTPATIENT_CLINIC_OR_DEPARTMENT_OTHER): Payer: 59

## 2015-01-23 VITALS — BP 136/71 | HR 72 | Temp 98.1°F | Resp 16

## 2015-01-23 DIAGNOSIS — C182 Malignant neoplasm of ascending colon: Secondary | ICD-10-CM

## 2015-01-23 DIAGNOSIS — Z5189 Encounter for other specified aftercare: Secondary | ICD-10-CM

## 2015-01-23 DIAGNOSIS — Z006 Encounter for examination for normal comparison and control in clinical research program: Secondary | ICD-10-CM

## 2015-01-23 DIAGNOSIS — C189 Malignant neoplasm of colon, unspecified: Secondary | ICD-10-CM

## 2015-01-23 MED ORDER — SODIUM CHLORIDE 0.9 % IJ SOLN
10.0000 mL | INTRAMUSCULAR | Status: DC | PRN
  Administered 2015-01-23: 10 mL
  Filled 2015-01-23: qty 10

## 2015-01-23 MED ORDER — PEGFILGRASTIM INJECTION 6 MG/0.6ML ~~LOC~~
6.0000 mg | PREFILLED_SYRINGE | Freq: Once | SUBCUTANEOUS | Status: AC
  Administered 2015-01-23: 6 mg via SUBCUTANEOUS
  Filled 2015-01-23: qty 0.6

## 2015-01-23 MED ORDER — HEPARIN SOD (PORK) LOCK FLUSH 100 UNIT/ML IV SOLN
500.0000 [IU] | Freq: Once | INTRAVENOUS | Status: AC | PRN
  Administered 2015-01-23: 500 [IU]
  Filled 2015-01-23: qty 5

## 2015-01-23 NOTE — Progress Notes (Signed)
Neulasta injection given by infusion nurse 

## 2015-01-30 ENCOUNTER — Other Ambulatory Visit: Payer: Self-pay | Admitting: Oncology

## 2015-01-30 ENCOUNTER — Ambulatory Visit (HOSPITAL_COMMUNITY)
Admission: RE | Admit: 2015-01-30 | Discharge: 2015-01-30 | Disposition: A | Discharge status: Home / Self Care | Payer: 59 | Source: Ambulatory Visit | Attending: Oncology | Admitting: Oncology

## 2015-01-30 DIAGNOSIS — J329 Chronic sinusitis, unspecified: Secondary | ICD-10-CM | POA: Insufficient documentation

## 2015-01-30 DIAGNOSIS — K0889 Other specified disorders of teeth and supporting structures: Secondary | ICD-10-CM

## 2015-01-30 DIAGNOSIS — K088 Other specified disorders of teeth and supporting structures: Secondary | ICD-10-CM | POA: Diagnosis present

## 2015-01-30 DIAGNOSIS — C189 Malignant neoplasm of colon, unspecified: Secondary | ICD-10-CM | POA: Diagnosis not present

## 2015-01-30 DIAGNOSIS — J3489 Other specified disorders of nose and nasal sinuses: Secondary | ICD-10-CM | POA: Diagnosis present

## 2015-02-01 ENCOUNTER — Other Ambulatory Visit: Payer: Self-pay | Admitting: Medical Oncology

## 2015-02-01 ENCOUNTER — Telehealth: Payer: Self-pay | Admitting: *Deleted

## 2015-02-01 DIAGNOSIS — C189 Malignant neoplasm of colon, unspecified: Secondary | ICD-10-CM

## 2015-02-01 NOTE — Telephone Encounter (Signed)
Per Dr. Benay Spice; notified pt that CT showed mild sinus inflammation, appears to have right upper dental disease, should see dentist to evaluate pain.  Pt verbalized understanding and states "Dr. Arcola Jansky in [place] is my dentist."  Office will forward CT report to dentist.

## 2015-02-01 NOTE — Telephone Encounter (Signed)
-----   Message from Ladell Pier, MD sent at 01/31/2015  7:16 PM EDT ----- Please call patient, mild sinus inflammation, appears to have rt. Upper dental disease, should see dentist to evaluate pain Find out dentist name and we can send CT report

## 2015-02-04 ENCOUNTER — Other Ambulatory Visit: Payer: Self-pay | Admitting: *Deleted

## 2015-02-04 ENCOUNTER — Telehealth: Payer: Self-pay | Admitting: Oncology

## 2015-02-04 ENCOUNTER — Encounter: Payer: Self-pay | Admitting: Medical Oncology

## 2015-02-04 ENCOUNTER — Ambulatory Visit (HOSPITAL_BASED_OUTPATIENT_CLINIC_OR_DEPARTMENT_OTHER): Payer: 59 | Admitting: Nurse Practitioner

## 2015-02-04 ENCOUNTER — Ambulatory Visit (HOSPITAL_BASED_OUTPATIENT_CLINIC_OR_DEPARTMENT_OTHER): Payer: 59

## 2015-02-04 ENCOUNTER — Other Ambulatory Visit (HOSPITAL_BASED_OUTPATIENT_CLINIC_OR_DEPARTMENT_OTHER): Payer: 59

## 2015-02-04 ENCOUNTER — Encounter: Payer: Self-pay | Admitting: *Deleted

## 2015-02-04 VITALS — BP 138/75 | HR 73 | Temp 98.1°F | Resp 16 | Ht 71.0 in | Wt 246.8 lb

## 2015-02-04 DIAGNOSIS — C182 Malignant neoplasm of ascending colon: Secondary | ICD-10-CM

## 2015-02-04 DIAGNOSIS — C189 Malignant neoplasm of colon, unspecified: Secondary | ICD-10-CM

## 2015-02-04 DIAGNOSIS — Z006 Encounter for examination for normal comparison and control in clinical research program: Secondary | ICD-10-CM | POA: Diagnosis not present

## 2015-02-04 DIAGNOSIS — Z5111 Encounter for antineoplastic chemotherapy: Secondary | ICD-10-CM

## 2015-02-04 DIAGNOSIS — C7989 Secondary malignant neoplasm of other specified sites: Secondary | ICD-10-CM | POA: Diagnosis not present

## 2015-02-04 DIAGNOSIS — C787 Secondary malignant neoplasm of liver and intrahepatic bile duct: Secondary | ICD-10-CM

## 2015-02-04 LAB — CBC WITH DIFFERENTIAL/PLATELET
BASO%: 0.5 % (ref 0.0–2.0)
Basophils Absolute: 0 10*3/uL (ref 0.0–0.1)
EOS ABS: 0 10*3/uL (ref 0.0–0.5)
EOS%: 0.8 % (ref 0.0–7.0)
HCT: 39.4 % (ref 38.4–49.9)
HEMOGLOBIN: 13.1 g/dL (ref 13.0–17.1)
LYMPH%: 15.6 % (ref 14.0–49.0)
MCH: 35 pg — AB (ref 27.2–33.4)
MCHC: 33.1 g/dL (ref 32.0–36.0)
MCV: 105.7 fL — AB (ref 79.3–98.0)
MONO#: 0.5 10*3/uL (ref 0.1–0.9)
MONO%: 8.9 % (ref 0.0–14.0)
NEUT%: 74.2 % (ref 39.0–75.0)
NEUTROS ABS: 4.4 10*3/uL (ref 1.5–6.5)
Platelets: 132 10*3/uL — ABNORMAL LOW (ref 140–400)
RBC: 3.73 10*6/uL — AB (ref 4.20–5.82)
RDW: 14.9 % — AB (ref 11.0–14.6)
WBC: 5.9 10*3/uL (ref 4.0–10.3)
lymph#: 0.9 10*3/uL (ref 0.9–3.3)

## 2015-02-04 LAB — MAGNESIUM (CC13): Magnesium: 1.8 mg/dl (ref 1.5–2.5)

## 2015-02-04 LAB — COMPREHENSIVE METABOLIC PANEL (CC13)
ALT: 33 U/L (ref 0–55)
ANION GAP: 8 meq/L (ref 3–11)
AST: 22 U/L (ref 5–34)
Albumin: 3.1 g/dL — ABNORMAL LOW (ref 3.5–5.0)
Alkaline Phosphatase: 76 U/L (ref 40–150)
BUN: 10.9 mg/dL (ref 7.0–26.0)
CHLORIDE: 106 meq/L (ref 98–109)
CO2: 25 meq/L (ref 22–29)
Calcium: 8.5 mg/dL (ref 8.4–10.4)
Creatinine: 1.1 mg/dL (ref 0.7–1.3)
EGFR: 76 mL/min/{1.73_m2} — AB (ref >90)
GLUCOSE: 157 mg/dL — AB (ref 70–140)
Potassium: 3.6 mEq/L (ref 3.5–5.1)
SODIUM: 139 meq/L (ref 136–145)
Total Bilirubin: 0.55 mg/dL (ref 0.20–1.20)
Total Protein: 5.7 g/dL — ABNORMAL LOW (ref 6.4–8.3)

## 2015-02-04 LAB — UA PROTEIN, DIPSTICK - CHCC

## 2015-02-04 MED ORDER — SODIUM CHLORIDE 0.9 % IV SOLN
Freq: Once | INTRAVENOUS | Status: AC
  Administered 2015-02-04: 12:00:00 via INTRAVENOUS
  Filled 2015-02-04: qty 5

## 2015-02-04 MED ORDER — PALONOSETRON HCL INJECTION 0.25 MG/5ML
INTRAVENOUS | Status: AC
  Filled 2015-02-04: qty 5

## 2015-02-04 MED ORDER — ATROPINE SULFATE 1 MG/ML IJ SOLN
INTRAMUSCULAR | Status: AC
  Filled 2015-02-04: qty 1

## 2015-02-04 MED ORDER — ATROPINE SULFATE 1 MG/ML IJ SOLN
0.5000 mg | Freq: Once | INTRAMUSCULAR | Status: AC | PRN
  Administered 2015-02-04: 0.5 mg via INTRAVENOUS

## 2015-02-04 MED ORDER — SODIUM CHLORIDE 0.9 % IV SOLN
1600.0000 mg/m2 | INTRAVENOUS | Status: DC
  Administered 2015-02-04: 3800 mg via INTRAVENOUS
  Filled 2015-02-04: qty 76

## 2015-02-04 MED ORDER — SODIUM CHLORIDE 0.9 % IV SOLN
Freq: Once | INTRAVENOUS | Status: AC
  Administered 2015-02-04: 11:00:00 via INTRAVENOUS

## 2015-02-04 MED ORDER — PALONOSETRON HCL INJECTION 0.25 MG/5ML
0.2500 mg | Freq: Once | INTRAVENOUS | Status: AC
  Administered 2015-02-04: 0.25 mg via INTRAVENOUS

## 2015-02-04 MED ORDER — FLUOROURACIL CHEMO INJECTION 2.5 GM/50ML
240.0000 mg/m2 | Freq: Once | INTRAVENOUS | Status: AC
  Administered 2015-02-04: 550 mg via INTRAVENOUS
  Filled 2015-02-04: qty 11

## 2015-02-04 MED ORDER — LEUCOVORIN CALCIUM INJECTION 350 MG
240.0000 mg/m2 | Freq: Once | INTRAVENOUS | Status: AC
  Administered 2015-02-04: 572 mg via INTRAVENOUS
  Filled 2015-02-04: qty 28.6

## 2015-02-04 MED ORDER — IRINOTECAN HCL CHEMO INJECTION 100 MG/5ML
180.0000 mg/m2 | Freq: Once | INTRAVENOUS | Status: AC
  Administered 2015-02-04: 428 mg via INTRAVENOUS
  Filled 2015-02-04: qty 21.4

## 2015-02-04 NOTE — Progress Notes (Addendum)
[facility] OFFICE PROGRESS NOTE   Diagnosis:  Colon cancer  INTERVAL HISTORY:   Bryan Fields returns as scheduled. He completed another cycle of FOLFIRI/Avastin 01/21/2015. He denies nausea/vomiting. No mouth sores. No diarrhea. He notes completing when he uses a water pick. She continues to note blood with nose blowing. He denies shortness of breath and chest pain. No leg swelling or calf pain. He has intermittent low abdominal pain.  He continues to have discomfort mainly at the right maxillary sinus. He also has gum pain with chewing.  He had a temperature last night of 101 with associated chills. He took Tylenol.  Objective:  Vital signs in last 24 hours:  Blood pressure 138/75, pulse 73, temperature 98.1 F (36.7 C), temperature source Oral, resp. rate 16, height $RemoveBe'5\' 11"'vUMqFrTPP$  (1.803 m), weight 246 lb 12.8 oz (111.948 kg), SpO2 100 %.    HEENT: No thrush or ulcers. Resp: Lungs clear bilaterally. Cardio: Regular rate and rhythm. GI: Abdomen is soft and nontender. No hepatomegaly. Upper abdominal wall mass measures approximately 7 cm x 5.5 cm. Vascular: No leg edema. Port-A-Cath without erythema.  Lab Results:  Lab Results  Component Value Date   WBC 5.9 02/04/2015   HGB 13.1 02/04/2015   HCT 39.4 02/04/2015   MCV 105.7* 02/04/2015   PLT 132* 02/04/2015   NEUTROABS 4.4 02/04/2015    Imaging:  No results found.  Medications: I have reviewed the patient's current medications.  Assessment/Plan: 1. 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 Microsatellite stable; preserved expression of major and minor MMR proteins. He began treatment with FOLFOX in December 2011. He completed cycle #12 on 10/27/2010. Oxaliplatin was held with cycles number 7 through 10 and resumed with cycle #11. 2. History of neutropenia secondary to chemotherapy. 3. History of  thrombocytopenia secondary chemotherapy. 4. He did not undergo a preoperative colonoscopy. He underwent a colonoscopy on 12/19/2010 with findings of a 1 cm polyp at 90 cm from the anal verge, minimal polyp at 30 cm from the anal verge and minimal diverticulosis. Colonoscopy 03/18/2012-negative. 5. 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 He began study drug on 05/13/2010. 6. Status post Port-A-Cath placement. The Port-A-Cath has been removed. 7. 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 9. Low attenuation lesion in the caudate lobe of the liver on the CT 04/11/2012-? Cyst. MRI liver on 04/20/2012 favored the caudate lobe liver lesion to represent a cyst or complex cyst. CT abdomen/pelvis 04/17/2013 with continued enlargement of hypovascular lesion in the caudate lobe of the liver.  PET scan 10/27/2011 confirmed a hypermetabolic caudate lobe liver lesion, tiny indeterminate lung nodules, and a 9 mm level II left neck node   CT biopsy of the liver lesion 11/07/2013 confirmed adenocarcinoma  Left liver and caudate resection 01/15/2014-pathology confirmed metastatic colon cancer, and negative surgical margins  Surveillance CT scans 07/24/2014 consistent with recurrent disease involving upper abdominal lymph nodes, peritoneal implants, and an anterior abdominal wall mass  Status post biopsy of the anterior abdominal wall mass 08/06/2014 with pathology showing metastatic adenocarcinoma consistent with a colon primary.  [id number] 1/28  Cycle 1 FOLFIRI/Avastin on the [facility] 1317 08/20/2014  Cycle 2 FOLFIRI/Avastin 09/03/2014  Cycle 3 held on 09/17/2014 due to neutropenia  Cycle 3 FOLFIRI/Avastin 09/25/2014. Neulasta added beginning with  cycle 3.  Cycle 4 FOLFIRI/Avastin 10/08/2014  Restaging CT scans 10/18/2014 with slight  interval increase in the size of the soft tissue mass in the subcutaneous fat of the epigastric region. Underlying peritoneal implants, probable focus of residual disease along the resected margin of the liver and hepatoduodenal ligament lymphadenopathy all appeared slightly improved. No new sites of metastatic disease otherwise noted. New left lower lobe bronchopneumonia.  Cycle 5 FOLFIRI/Avastin 10/22/2014  Cycle 6 FOLFIRI/Avastin 11/06/2014  Cycle 7 FOLFIRI Avastin 11/19/2014  Cycle 8 FOLFIRI/Avastin 12/03/2014  CT 12/14/2014 with stable disease  Cycle 9 FOLFIRI/Avastin 12/17/2014  Cycle 10 FOLFIRI/Avastin 01/07/2015 (5-fluorouracil and leucovorin dose reduced)  Cycle 11 FOLFIRI/Avastin 01/21/2015  Cycle 12 FOLFIRI/Avastin 02/04/2015 10. Iron deposition within the liver noted on MRI 04/20/2012. The ferritin level was normal on 04/17/2013. Increased iron deposition noted on the left liver resection 01/15/2014 11. Pain/bleeding at the anus reported when here 04/24/2013. Status post evaluation by Dr. [name] with findings of an anal fissure. 12. Right face cystic lesion 13. Status post Port-A-Cath placement 08/14/2014 14. Delayed nausea following FOLFIRI, prophylactic Decadron added with cycle 2 with improvement. 15. Chest CT 10/18/2014 with new left lower lobe bronchopneumonia. Levaquin initiated. Resolved on CT 12/14/2014 16. Gum pain- mucositis?, Gingivitis? 17. Pain/tenderness, mild erythema and full appearance over the maxillary sinuses. Maxillofacial CT 01/30/2015 with mild to moderate bilateral maxillary and sphenoid sinus inflammatory changes. Multifocal right maxillary dental periapical lucency.    Disposition: Bryan Fields appears stable. He has completed 11 cycles of FOLFIRI/Avastin. Plan to proceed with cycle 12 today as scheduled with the exception of Avastin. Avastin will be held pending dental evaluation.  He is scheduled for restaging scans and a follow-up visit on  02/13/2015. He will contact the office in the interim with any problems. We specifically discussed fever or other signs of infection.   ECOG performance status measured at 1.  Patient seen with Dr. Benay Spice.   Ned Card ANP/GNP-BC   02/04/2015  9:48 AM  This was a shared visit with Ned Card. We discussed the indication for removing the palpable abdominal mass. I explained to Bryan Fields this would not impact on his survival. We will ask Dr. Barry Dienes to remove the lesion if it becomes painful.  He will undergo restaging CTs as scheduled next week.  Julieanne Manson, M.D.

## 2015-02-04 NOTE — Telephone Encounter (Signed)
Gave adn pritned appt sched and avs for pt for Aug and Sept

## 2015-02-04 NOTE — Patient Instructions (Signed)
[facility] Discharge Instructions for Patients Receiving Chemotherapy  Today you received the following chemotherapy agents Leucovorin, Adrucil, and Irinotecan.  To help prevent nausea and vomiting after your treatment, we encourage you to take your nausea medication as prescribed.    If you develop nausea and vomiting that is not controlled by your nausea medication, call the clinic.   BELOW ARE SYMPTOMS THAT SHOULD BE REPORTED IMMEDIATELY:  *FEVER GREATER THAN 100.5 F  *CHILLS WITH OR WITHOUT FEVER  NAUSEA AND VOMITING THAT IS NOT CONTROLLED WITH YOUR NAUSEA MEDICATION  *UNUSUAL SHORTNESS OF BREATH  *UNUSUAL BRUISING OR BLEEDING  TENDERNESS IN MOUTH AND THROAT WITH OR WITHOUT PRESENCE OF ULCERS  *URINARY PROBLEMS  *BOWEL PROBLEMS  UNUSUAL RASH Items with * indicate a potential emergency and should be followed up as soon as possible.  Feel free to call the clinic you have any questions or concerns. The clinic phone number is [phone].  Please show the [facility] at check-in to the Emergency Department and triage nurse.

## 2015-02-04 NOTE — Progress Notes (Signed)
Oncology Nurse Navigator Documentation  Oncology Nurse Navigator Flowsheets 02/04/2015  Navigator Encounter Type Other-1st visit as navigator  Patient Visit Type Medonc  Treatment Phase Treatment  Barriers/Navigation Needs No barriers at this time  Interventions None required  Support Groups/Services GI  Time Spent with Patient 15

## 2015-02-04 NOTE — Progress Notes (Signed)
[id number]: Cycle 6 /Day 15 Met with patient and spouse after lab appointment and during VS check. Patient maintaining weight @ 246.8 lbs (111.9 kg), O2 sat @ 100%. Patient with shared visit today with Ned Card, NP and Dr. Benay Spice. Maxillofacial CT 01/30/2015 resulted with "mild to moderate bilateral maxillary and sphenoid sinus inflammatory changes. Multifocal right maxillary dental periapical lucency" noted. Patient continues to complain of pain to right upper jaw today (2 on pain scale and states he took 2 tablets of tylenol this morning), with gum sensitivity and difficulty chewing due to pain. Patient reports gums bleed with the use of water pick. Patient was encouraged to use a soft toothbrush to minimize abrasion to gums. Patient also reports having a fever last night of 101, with chills and shakes, he reports to have taken 2 tylenol initially and then 2 more before bed, and did not retake his temperature to re evaluate. Patient continues to report lower abdominal pain after treatment that lasts 3-5 days. He denies bowel and bladder concerns, denies nausea or vomiting, denies any new medications, and reports no changes in his energy level. Confirms his appetite is good and continues to drink a boost a day. Dr. Benay Spice assessed patient, lab results and NCS labs, per Md, patient cleared to receive treatment today and will hold Avastin, pending Dental evaluation. Confirmed with patient that he will contact his dentist for an appointment ASAP to evaluate dental concern. Patient confirms he will call today. All patient's and spouses questions answered to their satisfaction, they deny further questions at this time. Patient was provided with Chemo Alert card for future reference. Infusion treatment sign provided to [facility] RN and Erline Levine RN in treatment room. I encouraged patient to call clinic for any questions or concerns. Patient with study required CT scheduled 09/07 and MD visit that afternoon.  Adele Dan, RN, BSN Clinical Research 02/04/2015 12:04 PM

## 2015-02-05 ENCOUNTER — Ambulatory Visit: Payer: 59 | Admitting: Nurse Practitioner

## 2015-02-06 ENCOUNTER — Ambulatory Visit (HOSPITAL_BASED_OUTPATIENT_CLINIC_OR_DEPARTMENT_OTHER): Payer: 59

## 2015-02-06 ENCOUNTER — Ambulatory Visit: Payer: 59

## 2015-02-06 VITALS — BP 132/77 | HR 71 | Temp 98.3°F | Resp 18

## 2015-02-06 DIAGNOSIS — Z95828 Presence of other vascular implants and grafts: Secondary | ICD-10-CM

## 2015-02-06 DIAGNOSIS — C189 Malignant neoplasm of colon, unspecified: Secondary | ICD-10-CM

## 2015-02-06 DIAGNOSIS — Z5189 Encounter for other specified aftercare: Secondary | ICD-10-CM | POA: Diagnosis not present

## 2015-02-06 DIAGNOSIS — Z006 Encounter for examination for normal comparison and control in clinical research program: Secondary | ICD-10-CM

## 2015-02-06 MED ORDER — PEGFILGRASTIM INJECTION 6 MG/0.6ML ~~LOC~~
6.0000 mg | PREFILLED_SYRINGE | Freq: Once | SUBCUTANEOUS | Status: AC
  Administered 2015-02-06: 6 mg via SUBCUTANEOUS
  Filled 2015-02-06: qty 0.6

## 2015-02-06 MED ORDER — SODIUM CHLORIDE 0.9 % IJ SOLN
10.0000 mL | INTRAMUSCULAR | Status: DC | PRN
  Administered 2015-02-06: 10 mL via INTRAVENOUS
  Filled 2015-02-06: qty 10

## 2015-02-06 MED ORDER — HEPARIN SOD (PORK) LOCK FLUSH 100 UNIT/ML IV SOLN
500.0000 [IU] | Freq: Once | INTRAVENOUS | Status: AC
  Administered 2015-02-06: 500 [IU] via INTRAVENOUS
  Filled 2015-02-06: qty 5

## 2015-02-06 NOTE — Progress Notes (Signed)
Neulasta given by flush nurse  

## 2015-02-07 ENCOUNTER — Other Ambulatory Visit: Payer: Self-pay | Admitting: Medical Oncology

## 2015-02-13 ENCOUNTER — Ambulatory Visit (HOSPITAL_COMMUNITY)
Admission: RE | Admit: 2015-02-13 | Discharge: 2015-02-13 | Disposition: A | Discharge status: Home / Self Care | Payer: 59 | Source: Ambulatory Visit | Attending: Oncology | Admitting: Oncology

## 2015-02-13 ENCOUNTER — Ambulatory Visit (HOSPITAL_BASED_OUTPATIENT_CLINIC_OR_DEPARTMENT_OTHER): Payer: 59 | Admitting: Oncology

## 2015-02-13 ENCOUNTER — Encounter (HOSPITAL_COMMUNITY): Payer: Self-pay

## 2015-02-13 ENCOUNTER — Telehealth: Payer: Self-pay | Admitting: Oncology

## 2015-02-13 ENCOUNTER — Encounter: Payer: Self-pay | Admitting: Medical Oncology

## 2015-02-13 VITALS — BP 128/79 | HR 73 | Temp 98.6°F | Resp 17 | Ht 71.0 in | Wt 240.2 lb

## 2015-02-13 DIAGNOSIS — Z006 Encounter for examination for normal comparison and control in clinical research program: Secondary | ICD-10-CM | POA: Diagnosis not present

## 2015-02-13 DIAGNOSIS — I2510 Atherosclerotic heart disease of native coronary artery without angina pectoris: Secondary | ICD-10-CM | POA: Insufficient documentation

## 2015-02-13 DIAGNOSIS — J432 Centrilobular emphysema: Secondary | ICD-10-CM | POA: Diagnosis not present

## 2015-02-13 DIAGNOSIS — C189 Malignant neoplasm of colon, unspecified: Secondary | ICD-10-CM

## 2015-02-13 DIAGNOSIS — C182 Malignant neoplasm of ascending colon: Secondary | ICD-10-CM

## 2015-02-13 MED ORDER — IOHEXOL 300 MG/ML  SOLN
100.0000 mL | Freq: Once | INTRAMUSCULAR | Status: AC | PRN
  Administered 2015-02-13: 100 mL via INTRAVENOUS

## 2015-02-13 NOTE — Progress Notes (Signed)
[id number]: Patient here today for visit with MD to review results of CT scan from this morning. I met with patient and spouse in exam room with Dr. Benay Spice, patient informed by MD that CT results show stable disease. Patient continues to report lower abdominal pain but now the pain radiates to upper abdomen and reports the pain a 3 on pain scale, patient states pain goes away after approximately  5 days and then returns again after chemotherapy treatment. Patient states he only takes Aleve or tylenol for abdominal pain. Patient confirms no new or unusual bleeding, denies diarrhea, denies nausea/vomitting, denies any new or unusual symptoms. Patient confirms seeing his dentist and has had a tooth extracted from upper right side and states pain and discomfort he was experiencing in the right maxillary region has resolved.  Patient with scheduled appointment for start of cycle 7/day 1 on Monday 09/12, patient expressed to MD and this research nurse that he would like to go off study due to, as patient reports, "it takes me longer and longer to recover after each treatment, " patient reports the tiredness he has been experiencing is making it difficult for him to work. Patient expressed wanting change in treatment schedule, one that isn't every 2 weeks and per Dr. Benay Spice can change treatment to every 3 weeks and patient expressed approval of this change. I inquired with patient if he still continues to consent to follow-up on study and research labs, patient confirms that he does. Per Dr. Benay Spice patient will go off study and start treatment every 3 three weeks as patient requested, with Cycle 1 starting 09/19, patient gave verbal understanding. All patient and spouse's questions answered to their satisfaction, they deny further questions at this time. I thanked patient for his time and willingness to participate in study and encouraged him to call Dr. Benay Spice or me with any questions or concerns. Adele Dan, RN, BSN Clinical Research 02/13/2015 4:09 PM

## 2015-02-13 NOTE — Telephone Encounter (Signed)
APPOINTMENTS made and avs printed for patient °

## 2015-02-13 NOTE — Progress Notes (Signed)
[facility] OFFICE PROGRESS NOTE   Diagnosis: Colon cancer  INTERVAL HISTORY:   Bryan Fields returns as scheduled. He completed another cycle of FOLFIRI/Avastin 02/04/2015. He develops diaphoresis during the chemotherapy infusion. No diarrhea. He reports bleeding in the mornings. He has pain in the low abdomen that he describes as mild.  He had a right upper tooth pulled last week and the "sinus "pain has resolved.  He reports malaise following chemotherapy. The malaise and low abdominal pain have persisted after the cycle.    Objective:  Vital signs in last 24 hours:  Blood pressure 128/79, pulse 73, temperature 98.6 F (37 C), temperature source Oral, resp. rate 17, height $RemoveBe'5\' 11"'gdUpgBluS$  (1.803 m), weight 240 lb 3.2 oz (108.954 kg), SpO2 99 %.    HEENT: No thrush or ulcer Lymphatics: No cervical, supravesicular, axillary, or inguinal nodes Resp: Coarse rhonchi at the left posterior base, no respiratory distress Cardio: Regular rate and rhythm GI: No hepatomegaly, no apparent ascites, tender in the low abdomen bilaterally, upper abdominal wall mass measures 6 cm x 5.5 cm Vascular: No leg edema     Portacath/PICC-without erythema  Lab Results:  Lab Results  Component Value Date   WBC 5.9 02/04/2015   HGB 13.1 02/04/2015   HCT 39.4 02/04/2015   MCV 105.7* 02/04/2015   PLT 132* 02/04/2015   NEUTROABS 4.4 02/04/2015      Lab Results  Component Value Date   CEA 1.9 12/17/2014    Imaging:  Ct Chest W Contrast  02/13/2015   ADDENDUM REPORT: 02/13/2015 12:41  ADDENDUM: Target lesion 4. Should be "low-density lesion along the anterior aspect of the surgical margin (described as a lymph node on prior exams) measures 3.0 x 2.1 cm on image 48. Compare 3.4 x 2.2 cm on the prior."   Electronically Signed   By: Abigail Miyamoto M.D.   On: 02/13/2015 12:41   02/13/2015   CLINICAL DATA:  Restaging of colon cancer diagnosed in 2011, 15, and 16. Metastasis to liver and  abdominal wall. Colon resection. Partial hepatectomy. Ongoing chemotherapy. Restaging.  EXAM: CT CHEST, ABDOMEN, AND PELVIS WITH CONTRAST  TECHNIQUE: Multidetector CT imaging of the chest, abdomen and pelvis was performed following the standard protocol during bolus administration of intravenous contrast.  CONTRAST:  133mL OMNIPAQUE IOHEXOL 300 MG/ML  SOLN  COMPARISON:  12/14/2014  FINDINGS: RECIST 1.1  Target Lesions:  1. Anterior abdominal wall mass measures 5.8 x 4.0 cm on image 47. Compare 5.8 x 3.9 cm on the prior. 2. A left abdominal omental nodule measures 1.8 cm on image 62 versus 1.9 cm on the prior. 3. Perigastric peritoneal nodule measures 3.1 x 1.7 cm on image 50. Compare 2.8 x 1.8 cm on the prior exam (when remeasured). 4. Perigastric peritoneal nodule measures 3.1 x 1.7 cm on image 50. Compare 2.8 x 1.8 cm on the prior exam (when remeasured). 5. Hepatoduodenal ligament node measures 1.7 x 2.9 cm on image 55. Compare 2.0 x 3.3 cm on the prior exam (when remeasured). Non-target Lesions:  1. None CT CHEST FINDINGS  Mediastinum/Nodes: No supraclavicular adenopathy. Normal heart size, without pericardial effusion. No central pulmonary embolism, on this non-dedicated study. LAD coronary artery atherosclerosis. No mediastinal or hilar adenopathy.  Lungs/Pleura: No pleural fluid. Mild centrilobular emphysema. Bibasilar dependent opacities are likely due to atelectasis.  Musculoskeletal: No acute osseous abnormality.  CT ABDOMEN AND PELVIS FINDINGS  Hepatobiliary: Left sided partial hepatectomy. No new liver lesion. low-density lesion along the anterior aspect  of the surgical margin (described as a lymph node on prior exams) measures 3.0 x 2.1 cm on image 48. Compare 3.4 x 2.2 cm on the prior. Normal gallbladder, without biliary ductal dilatation.  Pancreas: Mild pancreatic atrophy, without duct dilatation or inflammation.  Spleen: Normal in size, without focal abnormality.  Adrenals/Urinary Tract: Normal  adrenal glands. Normal kidneys, without hydronephrosis. Normal urinary bladder.  Stomach/Bowel: Surgical sutures about the right-side of the colon and terminal ileum. Otherwise, normal appearance of the colon and small bowel.  Vascular/Lymphatic: Multiple renal arteries bilaterally. Aortic and branch vessel atherosclerosis. Hepatoduodenal ligament node measures 1.7 x 2.9 cm on image 55. Compare 2.0 x 3.3 cm on the prior exam (when remeasured). No new abdominal adenopathy. No pelvic adenopathy.  Reproductive: Normal prostate.  Other: No significant free fluid. Perigastric peritoneal nodule measures 3.1 x 1.7 cm on image 50. Compare 2.8 x 1.8 cm on the prior exam (when remeasured).  A left abdominal omental nodule measures 1.8 cm on image 62 versus 1.9 cm on the prior.  Anterior abdominal wall mass measures 5.8 x 4.0 cm on image 47. Compare 5.8 x 3.9 cm on the prior.  Musculoskeletal: No acute osseous abnormality.  IMPRESSION: CT CHEST IMPRESSION  1.  No acute process or evidence of metastatic disease in the chest. 2. Mild centrilobular emphysema. 3. Coronary artery atherosclerosis.  CT ABDOMEN AND PELVIS IMPRESSION  1. Similar to slight response to therapy. Anterior abdominal wall, omental/peritoneal, and nodal lesions as detailed above. 2. No new sites of disease identified. 3. Partial left hepatectomy, without new hepatic lesion.  Electronically Signed: By: Abigail Miyamoto M.D. On: 02/13/2015 11:49   Ct Abdomen Pelvis W Contrast  02/13/2015   ADDENDUM REPORT: 02/13/2015 12:41  ADDENDUM: Target lesion 4. Should be "low-density lesion along the anterior aspect of the surgical margin (described as a lymph node on prior exams) measures 3.0 x 2.1 cm on image 48. Compare 3.4 x 2.2 cm on the prior."   Electronically Signed   By: Abigail Miyamoto M.D.   On: 02/13/2015 12:41   02/13/2015   CLINICAL DATA:  Restaging of colon cancer diagnosed in 2011, 15, and 16. Metastasis to liver and abdominal wall. Colon resection. Partial  hepatectomy. Ongoing chemotherapy. Restaging.  EXAM: CT CHEST, ABDOMEN, AND PELVIS WITH CONTRAST  TECHNIQUE: Multidetector CT imaging of the chest, abdomen and pelvis was performed following the standard protocol during bolus administration of intravenous contrast.  CONTRAST:  161mL OMNIPAQUE IOHEXOL 300 MG/ML  SOLN  COMPARISON:  12/14/2014  FINDINGS: RECIST 1.1  Target Lesions:  1. Anterior abdominal wall mass measures 5.8 x 4.0 cm on image 47. Compare 5.8 x 3.9 cm on the prior. 2. A left abdominal omental nodule measures 1.8 cm on image 62 versus 1.9 cm on the prior. 3. Perigastric peritoneal nodule measures 3.1 x 1.7 cm on image 50. Compare 2.8 x 1.8 cm on the prior exam (when remeasured). 4. Perigastric peritoneal nodule measures 3.1 x 1.7 cm on image 50. Compare 2.8 x 1.8 cm on the prior exam (when remeasured). 5. Hepatoduodenal ligament node measures 1.7 x 2.9 cm on image 55. Compare 2.0 x 3.3 cm on the prior exam (when remeasured). Non-target Lesions:  1. None CT CHEST FINDINGS  Mediastinum/Nodes: No supraclavicular adenopathy. Normal heart size, without pericardial effusion. No central pulmonary embolism, on this non-dedicated study. LAD coronary artery atherosclerosis. No mediastinal or hilar adenopathy.  Lungs/Pleura: No pleural fluid. Mild centrilobular emphysema. Bibasilar dependent opacities are likely due to atelectasis.  Musculoskeletal: No acute osseous abnormality.  CT ABDOMEN AND PELVIS FINDINGS  Hepatobiliary: Left sided partial hepatectomy. No new liver lesion. low-density lesion along the anterior aspect of the surgical margin (described as a lymph node on prior exams) measures 3.0 x 2.1 cm on image 48. Compare 3.4 x 2.2 cm on the prior. Normal gallbladder, without biliary ductal dilatation.  Pancreas: Mild pancreatic atrophy, without duct dilatation or inflammation.  Spleen: Normal in size, without focal abnormality.  Adrenals/Urinary Tract: Normal adrenal glands. Normal kidneys, without  hydronephrosis. Normal urinary bladder.  Stomach/Bowel: Surgical sutures about the right-side of the colon and terminal ileum. Otherwise, normal appearance of the colon and small bowel.  Vascular/Lymphatic: Multiple renal arteries bilaterally. Aortic and branch vessel atherosclerosis. Hepatoduodenal ligament node measures 1.7 x 2.9 cm on image 55. Compare 2.0 x 3.3 cm on the prior exam (when remeasured). No new abdominal adenopathy. No pelvic adenopathy.  Reproductive: Normal prostate.  Other: No significant free fluid. Perigastric peritoneal nodule measures 3.1 x 1.7 cm on image 50. Compare 2.8 x 1.8 cm on the prior exam (when remeasured).  A left abdominal omental nodule measures 1.8 cm on image 62 versus 1.9 cm on the prior.  Anterior abdominal wall mass measures 5.8 x 4.0 cm on image 47. Compare 5.8 x 3.9 cm on the prior.  Musculoskeletal: No acute osseous abnormality.  IMPRESSION: CT CHEST IMPRESSION  1.  No acute process or evidence of metastatic disease in the chest. 2. Mild centrilobular emphysema. 3. Coronary artery atherosclerosis.  CT ABDOMEN AND PELVIS IMPRESSION  1. Similar to slight response to therapy. Anterior abdominal wall, omental/peritoneal, and nodal lesions as detailed above. 2. No new sites of disease identified. 3. Partial left hepatectomy, without new hepatic lesion.  Electronically Signed: By: Abigail Miyamoto M.D. On: 02/13/2015 11:49    Medications: I have reviewed the patient's current medications.  Assessment/Plan: 1. 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 Microsatellite stable; preserved expression of major and minor MMR proteins. He began treatment with FOLFOX in December 2011. He completed cycle #12 on 10/27/2010. Oxaliplatin was held with cycles number 7 through 10 and resumed with cycle #11. 2. History of neutropenia secondary to chemotherapy. 3. History of  thrombocytopenia secondary chemotherapy. 4. He did not undergo a preoperative colonoscopy. He underwent a colonoscopy on 12/19/2010 with findings of a 1 cm polyp at 90 cm from the anal verge, minimal polyp at 30 cm from the anal verge and minimal diverticulosis. Colonoscopy 03/18/2012-negative. 5. 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 He began study drug on 05/13/2010. 6. Status post Port-A-Cath placement. The Port-A-Cath has been removed. 7. 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 9. Low attenuation lesion in the caudate lobe of the liver on the CT 04/11/2012-? Cyst. MRI liver on 04/20/2012 favored the caudate lobe liver lesion to represent a cyst or complex cyst. CT abdomen/pelvis 04/17/2013 with continued enlargement of hypovascular lesion in the caudate lobe of the liver.  PET scan 10/27/2011 confirmed a hypermetabolic caudate lobe liver lesion, tiny indeterminate lung nodules, and a 9 mm level II left neck node   CT biopsy of the liver lesion 11/07/2013 confirmed adenocarcinoma  Left liver and caudate resection 01/15/2014-pathology confirmed metastatic colon cancer, and negative surgical margins  Surveillance CT scans 07/24/2014 consistent with recurrent  disease involving upper abdominal lymph nodes, peritoneal implants, and an anterior abdominal wall mass  Status post biopsy of the anterior abdominal wall mass 08/06/2014 with pathology showing metastatic adenocarcinoma consistent with a colon primary.  UGT1A1 1/28  Cycle 1 FOLFIRI/Avastin on the [facility] 1317 08/20/2014  Cycle 2 FOLFIRI/Avastin 09/03/2014  Cycle 3 held on 09/17/2014 due to neutropenia  Cycle 3 FOLFIRI/Avastin 09/25/2014. Neulasta added beginning with cycle 3.  Cycle 4 FOLFIRI/Avastin 10/08/2014  Restaging CT scans 10/18/2014 with slight  interval increase in the size of the soft tissue mass in the subcutaneous fat of the epigastric region. Underlying peritoneal implants, probable focus of residual disease along the resected margin of the liver and hepatoduodenal ligament lymphadenopathy all appeared slightly improved. No new sites of metastatic disease otherwise noted. New left lower lobe bronchopneumonia.  Cycle 5 FOLFIRI/Avastin 10/22/2014  Cycle 6 FOLFIRI/Avastin 11/06/2014  Cycle 7 FOLFIRI Avastin 11/19/2014  Cycle 8 FOLFIRI/Avastin 12/03/2014  CT 12/14/2014 with stable disease  Cycle 9 FOLFIRI/Avastin 12/17/2014  Cycle 10 FOLFIRI/Avastin 01/07/2015 (5-fluorouracil and leucovorin dose reduced)  Cycle 11 FOLFIRI/Avastin 01/21/2015  Cycle 12 FOLFIRI/Avastin 02/04/2015  CT 02/13/2015 with stable disease  Cycle 13 FOLFIRI/Avastin 02/25/2015-changed to an every three-week protocol, now off study 10. Iron deposition within the liver noted on MRI 04/20/2012. The ferritin level was normal on 04/17/2013. Increased iron deposition noted on the left liver resection 01/15/2014 11. Pain/bleeding at the anus reported when here 04/24/2013. Status post evaluation by Dr. [name] with findings of an anal fissure. 12. Right face cystic lesion 13. Status post Port-A-Cath placement 08/14/2014 14. Delayed nausea following FOLFIRI, prophylactic Decadron added with cycle 2 with improvement. 15. Chest CT 10/18/2014 with new left lower lobe bronchopneumonia. Levaquin initiated. Resolved on CT 12/14/2014 16. Gum pain- mucositis?, Gingivitis? 17. Pain/tenderness, mild erythema and full appearance over the maxillary sinuses. Maxillofacial CT 01/30/2015 with mild to moderate bilateral maxillary and sphenoid sinus inflammatory changes. Multifocal right maxillary dental periapical lucency. Status post a right upper tooth extraction with resolution of the pain   Disposition:  Bryan Fields appears stable. The etiology of the low abdominal pain  is unclear. The pain is intermittent and may be related to chemotherapy enteritis, though he does not have significant diarrhea.  The restaging CT reveals stable disease. He requests a change to a 3 week chemotherapy regimen secondary to malaise. He will be taken off protocol secondary to this change with the plan to continue FOLFIRI/Avastin every 3 weeks.  He will return for the next cycle of chemotherapy 02/25/2015. Bryan Fields is scheduled for an office visit and chemotherapy 03/18/2015.  His ECOG performance status is measured at 1 today.  Betsy Coder, MD  02/13/2015  2:23 PM

## 2015-02-18 ENCOUNTER — Other Ambulatory Visit: Payer: 59

## 2015-02-18 ENCOUNTER — Ambulatory Visit: Payer: 59

## 2015-02-20 ENCOUNTER — Ambulatory Visit: Payer: 59

## 2015-02-22 ENCOUNTER — Other Ambulatory Visit: Payer: Self-pay | Admitting: Medical Oncology

## 2015-02-22 DIAGNOSIS — C189 Malignant neoplasm of colon, unspecified: Secondary | ICD-10-CM

## 2015-02-25 ENCOUNTER — Other Ambulatory Visit: Payer: 59

## 2015-02-25 ENCOUNTER — Ambulatory Visit (HOSPITAL_BASED_OUTPATIENT_CLINIC_OR_DEPARTMENT_OTHER): Payer: 59

## 2015-02-25 ENCOUNTER — Ambulatory Visit: Payer: 59

## 2015-02-25 ENCOUNTER — Other Ambulatory Visit (HOSPITAL_BASED_OUTPATIENT_CLINIC_OR_DEPARTMENT_OTHER): Payer: 59

## 2015-02-25 VITALS — BP 140/82 | HR 75 | Temp 98.1°F | Resp 18

## 2015-02-25 DIAGNOSIS — Z23 Encounter for immunization: Secondary | ICD-10-CM

## 2015-02-25 DIAGNOSIS — Z006 Encounter for examination for normal comparison and control in clinical research program: Secondary | ICD-10-CM

## 2015-02-25 DIAGNOSIS — C189 Malignant neoplasm of colon, unspecified: Secondary | ICD-10-CM

## 2015-02-25 DIAGNOSIS — Z5112 Encounter for antineoplastic immunotherapy: Secondary | ICD-10-CM

## 2015-02-25 DIAGNOSIS — C182 Malignant neoplasm of ascending colon: Secondary | ICD-10-CM

## 2015-02-25 LAB — COMPREHENSIVE METABOLIC PANEL (CC13)
ALT: 40 U/L (ref 0–55)
AST: 29 U/L (ref 5–34)
Albumin: 3.4 g/dL — ABNORMAL LOW (ref 3.5–5.0)
Alkaline Phosphatase: 48 U/L (ref 40–150)
Anion Gap: 7 mEq/L (ref 3–11)
BILIRUBIN TOTAL: 0.47 mg/dL (ref 0.20–1.20)
BUN: 8.6 mg/dL (ref 7.0–26.0)
CHLORIDE: 107 meq/L (ref 98–109)
CO2: 28 meq/L (ref 22–29)
CREATININE: 1 mg/dL (ref 0.7–1.3)
Calcium: 9.1 mg/dL (ref 8.4–10.4)
EGFR: 80 mL/min/{1.73_m2} — ABNORMAL LOW (ref >90)
GLUCOSE: 107 mg/dL (ref 70–140)
Potassium: 4.3 mEq/L (ref 3.5–5.1)
Sodium: 143 mEq/L (ref 136–145)
TOTAL PROTEIN: 6 g/dL — AB (ref 6.4–8.3)

## 2015-02-25 LAB — CBC WITH DIFFERENTIAL/PLATELET
BASO%: 0.4 % (ref 0.0–2.0)
Basophils Absolute: 0 10*3/uL (ref 0.0–0.1)
EOS ABS: 0.3 10*3/uL (ref 0.0–0.5)
EOS%: 3.3 % (ref 0.0–7.0)
HCT: 40.5 % (ref 38.4–49.9)
HEMOGLOBIN: 13.3 g/dL (ref 13.0–17.1)
LYMPH%: 21.4 % (ref 14.0–49.0)
MCH: 34.8 pg — ABNORMAL HIGH (ref 27.2–33.4)
MCHC: 32.8 g/dL (ref 32.0–36.0)
MCV: 106 fL — ABNORMAL HIGH (ref 79.3–98.0)
MONO#: 0.8 10*3/uL (ref 0.1–0.9)
MONO%: 10.7 % (ref 0.0–14.0)
NEUT%: 64.2 % (ref 39.0–75.0)
NEUTROS ABS: 4.9 10*3/uL (ref 1.5–6.5)
Platelets: 148 10*3/uL (ref 140–400)
RBC: 3.82 10*6/uL — AB (ref 4.20–5.82)
RDW: 14.6 % (ref 11.0–14.6)
WBC: 7.7 10*3/uL (ref 4.0–10.3)
lymph#: 1.6 10*3/uL (ref 0.9–3.3)

## 2015-02-25 LAB — MAGNESIUM (CC13): Magnesium: 2.1 mg/dl (ref 1.5–2.5)

## 2015-02-25 LAB — UA PROTEIN, DIPSTICK - CHCC: PROTEIN: NEGATIVE mg/dL

## 2015-02-25 MED ORDER — IRINOTECAN HCL CHEMO INJECTION 100 MG/5ML
180.0000 mg/m2 | Freq: Once | INTRAVENOUS | Status: AC
  Administered 2015-02-25: 422 mg via INTRAVENOUS
  Filled 2015-02-25: qty 21.1

## 2015-02-25 MED ORDER — DEXTROSE 5 % IV SOLN
240.0000 mg/m2 | Freq: Once | INTRAVENOUS | Status: AC
  Administered 2015-02-25: 562 mg via INTRAVENOUS
  Filled 2015-02-25: qty 28.1

## 2015-02-25 MED ORDER — PALONOSETRON HCL INJECTION 0.25 MG/5ML
INTRAVENOUS | Status: AC
  Filled 2015-02-25: qty 5

## 2015-02-25 MED ORDER — SODIUM CHLORIDE 0.9 % IV SOLN
Freq: Once | INTRAVENOUS | Status: AC
  Administered 2015-02-25: 13:00:00 via INTRAVENOUS

## 2015-02-25 MED ORDER — SODIUM CHLORIDE 0.9 % IV SOLN
7.3000 mg/kg | Freq: Once | INTRAVENOUS | Status: AC
  Administered 2015-02-25: 800 mg via INTRAVENOUS
  Filled 2015-02-25: qty 32

## 2015-02-25 MED ORDER — FLUOROURACIL CHEMO INJECTION 2.5 GM/50ML
240.0000 mg/m2 | Freq: Once | INTRAVENOUS | Status: AC
Start: 2015-02-25 — End: 2015-02-25
  Administered 2015-02-25: 550 mg via INTRAVENOUS
  Filled 2015-02-25: qty 11

## 2015-02-25 MED ORDER — INFLUENZA VAC SPLIT QUAD 0.5 ML IM SUSY
0.5000 mL | PREFILLED_SYRINGE | Freq: Once | INTRAMUSCULAR | Status: AC
  Administered 2015-02-25: 0.5 mL via INTRAMUSCULAR
  Filled 2015-02-25: qty 0.5

## 2015-02-25 MED ORDER — SODIUM CHLORIDE 0.9 % IV SOLN
1600.0000 mg/m2 | INTRAVENOUS | Status: DC
  Administered 2015-02-25: 3750 mg via INTRAVENOUS
  Filled 2015-02-25: qty 75

## 2015-02-25 MED ORDER — PALONOSETRON HCL INJECTION 0.25 MG/5ML
0.2500 mg | Freq: Once | INTRAVENOUS | Status: AC
  Administered 2015-02-25: 0.25 mg via INTRAVENOUS

## 2015-02-25 MED ORDER — ATROPINE SULFATE 1 MG/ML IJ SOLN
INTRAMUSCULAR | Status: AC
  Filled 2015-02-25: qty 1

## 2015-02-25 MED ORDER — ATROPINE SULFATE 1 MG/ML IJ SOLN
0.5000 mg | Freq: Once | INTRAMUSCULAR | Status: AC | PRN
  Administered 2015-02-25: 0.5 mg via INTRAVENOUS

## 2015-02-25 MED ORDER — FOSAPREPITANT DIMEGLUMINE INJECTION 150 MG
Freq: Once | INTRAVENOUS | Status: AC
  Administered 2015-02-25: 13:00:00 via INTRAVENOUS
  Filled 2015-02-25: qty 5

## 2015-02-25 NOTE — Patient Instructions (Signed)
[facility] Discharge Instructions for Patients Receiving Chemotherapy  Today you received the following chemotherapy agents: Irinotecan, Leucovorin, Avastin, 5FU  To help prevent nausea and vomiting after your treatment, we encourage you to take your nausea medication as prescribed by your physician.   If you develop nausea and vomiting that is not controlled by your nausea medication, call the clinic.   BELOW ARE SYMPTOMS THAT SHOULD BE REPORTED IMMEDIATELY:  *FEVER GREATER THAN 100.5 F  *CHILLS WITH OR WITHOUT FEVER  NAUSEA AND VOMITING THAT IS NOT CONTROLLED WITH YOUR NAUSEA MEDICATION  *UNUSUAL SHORTNESS OF BREATH  *UNUSUAL BRUISING OR BLEEDING  TENDERNESS IN MOUTH AND THROAT WITH OR WITHOUT PRESENCE OF ULCERS  *URINARY PROBLEMS  *BOWEL PROBLEMS  UNUSUAL RASH Items with * indicate a potential emergency and should be followed up as soon as possible.  Feel free to call the clinic you have any questions or concerns. The clinic phone number is [phone].  Please show the [facility] at check-in to the Emergency Department and triage nurse.

## 2015-02-26 LAB — CEA: CEA: 1.7 ng/mL (ref 0.0–5.0)

## 2015-02-27 ENCOUNTER — Ambulatory Visit: Payer: 59

## 2015-02-27 ENCOUNTER — Ambulatory Visit (HOSPITAL_BASED_OUTPATIENT_CLINIC_OR_DEPARTMENT_OTHER): Payer: 59

## 2015-02-27 DIAGNOSIS — Z5189 Encounter for other specified aftercare: Secondary | ICD-10-CM

## 2015-02-27 DIAGNOSIS — C182 Malignant neoplasm of ascending colon: Secondary | ICD-10-CM | POA: Diagnosis not present

## 2015-02-27 DIAGNOSIS — C189 Malignant neoplasm of colon, unspecified: Secondary | ICD-10-CM

## 2015-02-27 MED ORDER — SODIUM CHLORIDE 0.9 % IJ SOLN
10.0000 mL | INTRAMUSCULAR | Status: DC | PRN
  Administered 2015-02-27: 10 mL
  Filled 2015-02-27: qty 10

## 2015-02-27 MED ORDER — PEGFILGRASTIM INJECTION 6 MG/0.6ML ~~LOC~~
6.0000 mg | PREFILLED_SYRINGE | Freq: Once | SUBCUTANEOUS | Status: AC
  Administered 2015-02-27: 6 mg via SUBCUTANEOUS
  Filled 2015-02-27: qty 0.6

## 2015-02-27 MED ORDER — HEPARIN SOD (PORK) LOCK FLUSH 100 UNIT/ML IV SOLN
500.0000 [IU] | Freq: Once | INTRAVENOUS | Status: AC | PRN
  Administered 2015-02-27: 500 [IU]
  Filled 2015-02-27: qty 5

## 2015-02-27 NOTE — Progress Notes (Signed)
Neulasta injection given by infusion after port flushed

## 2015-03-04 ENCOUNTER — Other Ambulatory Visit: Payer: 59

## 2015-03-04 ENCOUNTER — Ambulatory Visit: Payer: 59

## 2015-03-04 ENCOUNTER — Ambulatory Visit: Payer: 59 | Admitting: Nurse Practitioner

## 2015-03-06 ENCOUNTER — Ambulatory Visit: Payer: 59

## 2015-03-15 ENCOUNTER — Other Ambulatory Visit: Payer: Self-pay | Admitting: Oncology

## 2015-03-17 ENCOUNTER — Other Ambulatory Visit: Payer: Self-pay | Admitting: Oncology

## 2015-03-18 ENCOUNTER — Other Ambulatory Visit (HOSPITAL_BASED_OUTPATIENT_CLINIC_OR_DEPARTMENT_OTHER): Payer: 59

## 2015-03-18 ENCOUNTER — Ambulatory Visit (HOSPITAL_BASED_OUTPATIENT_CLINIC_OR_DEPARTMENT_OTHER): Payer: 59 | Admitting: Nurse Practitioner

## 2015-03-18 ENCOUNTER — Ambulatory Visit: Payer: 59

## 2015-03-18 ENCOUNTER — Telehealth: Payer: Self-pay | Admitting: Oncology

## 2015-03-18 VITALS — BP 135/86 | HR 64 | Temp 98.6°F | Resp 18 | Ht 71.0 in | Wt 248.9 lb

## 2015-03-18 DIAGNOSIS — K1379 Other lesions of oral mucosa: Secondary | ICD-10-CM

## 2015-03-18 DIAGNOSIS — C189 Malignant neoplasm of colon, unspecified: Secondary | ICD-10-CM

## 2015-03-18 DIAGNOSIS — C182 Malignant neoplasm of ascending colon: Secondary | ICD-10-CM

## 2015-03-18 LAB — CBC WITH DIFFERENTIAL/PLATELET
BASO%: 0.5 % (ref 0.0–2.0)
BASOS ABS: 0 10*3/uL (ref 0.0–0.1)
EOS ABS: 0.2 10*3/uL (ref 0.0–0.5)
EOS%: 2.7 % (ref 0.0–7.0)
HCT: 42.3 % (ref 38.4–49.9)
HGB: 14.2 g/dL (ref 13.0–17.1)
LYMPH%: 27.9 % (ref 14.0–49.0)
MCH: 35.3 pg — ABNORMAL HIGH (ref 27.2–33.4)
MCHC: 33.6 g/dL (ref 32.0–36.0)
MCV: 105.2 fL — AB (ref 79.3–98.0)
MONO#: 0.6 10*3/uL (ref 0.1–0.9)
MONO%: 8.6 % (ref 0.0–14.0)
NEUT#: 4 10*3/uL (ref 1.5–6.5)
NEUT%: 60.3 % (ref 39.0–75.0)
PLATELETS: 143 10*3/uL (ref 140–400)
RBC: 4.02 10*6/uL — AB (ref 4.20–5.82)
RDW: 14.5 % (ref 11.0–14.6)
WBC: 6.7 10*3/uL (ref 4.0–10.3)
lymph#: 1.9 10*3/uL (ref 0.9–3.3)

## 2015-03-18 LAB — COMPREHENSIVE METABOLIC PANEL (CC13)
ALT: 41 U/L (ref 0–55)
ANION GAP: 10 meq/L (ref 3–11)
AST: 28 U/L (ref 5–34)
Albumin: 3.4 g/dL — ABNORMAL LOW (ref 3.5–5.0)
Alkaline Phosphatase: 50 U/L (ref 40–150)
BILIRUBIN TOTAL: 0.51 mg/dL (ref 0.20–1.20)
BUN: 10.5 mg/dL (ref 7.0–26.0)
CALCIUM: 8.9 mg/dL (ref 8.4–10.4)
CHLORIDE: 108 meq/L (ref 98–109)
CO2: 22 meq/L (ref 22–29)
Creatinine: 1.1 mg/dL (ref 0.7–1.3)
EGFR: 73 mL/min/{1.73_m2} — AB (ref >90)
Glucose: 151 mg/dl — ABNORMAL HIGH (ref 70–140)
Potassium: 4 mEq/L (ref 3.5–5.1)
Sodium: 140 mEq/L (ref 136–145)
Total Protein: 6.5 g/dL (ref 6.4–8.3)

## 2015-03-18 LAB — URINALYSIS, MICROSCOPIC - CHCC
BILIRUBIN (URINE): NEGATIVE
Blood: NEGATIVE
GLUCOSE UR CHCC: NEGATIVE mg/dL
Ketones: NEGATIVE mg/dL
LEUKOCYTE ESTERASE: NEGATIVE
Nitrite: NEGATIVE
PH: 6 (ref 4.6–8.0)
Protein: <30 mg/dL
RBC / HPF: NEGATIVE (ref 0–2)
SPECIFIC GRAVITY, URINE: 1.02 (ref 1.003–1.035)
Urobilinogen, UR: 0.2 mg/dL (ref 0.2–1)

## 2015-03-18 LAB — MAGNESIUM (CC13): Magnesium: 2 mg/dl (ref 1.5–2.5)

## 2015-03-18 MED ORDER — DEXAMETHASONE 4 MG PO TABS: ORAL_TABLET | ORAL | Status: DC

## 2015-03-18 NOTE — Telephone Encounter (Signed)
Gave and printed appt sched and avs fo rpt for OCT °

## 2015-03-18 NOTE — Progress Notes (Addendum)
[facility] OFFICE PROGRESS NOTE   Diagnosis:  Colon cancer  INTERVAL HISTORY:   Bryan Fields returns as scheduled. He completed cycle 13 FOLFIRI/Avastin 02/25/2015. He denies nausea/vomiting. No diarrhea. He notes that his gums are "sore and bleeding". No discrete ulcers. He has been able to eat and drink without difficulty. He had abdominal pain for about 2 weeks after the most recent chemotherapy. He takes ibuprofen at nighttime.  Objective:  Vital signs in last 24 hours:  Blood pressure 135/86, pulse 64, temperature 98.6 F (37 C), temperature source Oral, resp. rate 18, height $RemoveBe'5\' 11"'JxifIjQtl$  (1.803 m), weight 248 lb 14.4 oz (112.9 kg), SpO2 96 %.    HEENT:  No thrush or ulcers. Gums appear diffusely inflamed. Resp:  Lungs clear bilaterally. Cardio:  Regular rate and rhythm. GI:  No hepatomegaly. Upper abdominal wall mass approximately 7 by 6 cm. Vascular:  No leg edema.  Port-A-Cath without erythema.  Lab Results:  Lab Results  Component Value Date   WBC 6.7 03/18/2015   HGB 14.2 03/18/2015   HCT 42.3 03/18/2015   MCV 105.2* 03/18/2015   PLT 143 03/18/2015   NEUTROABS 4.0 03/18/2015    Imaging:  No results found.  Medications: I have reviewed the patient's current medications.  Assessment/Plan: 1. 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 Microsatellite stable; preserved expression of major and minor MMR proteins. He began treatment with FOLFOX in December 2011. He completed cycle #12 on 10/27/2010. Oxaliplatin was held with cycles number 7 through 10 and resumed with cycle #11. 2. History of neutropenia secondary to chemotherapy. 3. History of thrombocytopenia secondary chemotherapy. 4. He did not undergo a preoperative colonoscopy. He underwent a colonoscopy on 12/19/2010 with findings of a 1 cm polyp at 90 cm from the anal verge, minimal polyp at  30 cm from the anal verge and minimal diverticulosis. Colonoscopy 03/18/2012-negative. 5. 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 He began study drug on 05/13/2010. 6. Status post Port-A-Cath placement. The Port-A-Cath has been removed. 7. 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 9. Low attenuation lesion in the caudate lobe of the liver on the CT 04/11/2012-? Cyst. MRI liver on 04/20/2012 favored the caudate lobe liver lesion to represent a cyst or complex cyst. CT abdomen/pelvis 04/17/2013 with continued enlargement of hypovascular lesion in the caudate lobe of the liver.  PET scan 10/27/2011 confirmed a hypermetabolic caudate lobe liver lesion, tiny indeterminate lung nodules, and a 9 mm level II left neck node   CT biopsy of the liver lesion 11/07/2013 confirmed adenocarcinoma  Left liver and caudate resection 01/15/2014-pathology confirmed metastatic colon cancer, and negative surgical margins  Surveillance CT scans 07/24/2014 consistent with recurrent disease involving upper abdominal lymph nodes, peritoneal implants, and an anterior abdominal wall mass  Status post biopsy of the anterior abdominal wall mass 08/06/2014 with pathology showing metastatic adenocarcinoma consistent with a colon primary.  UGT1A1 1/28  Cycle 1 FOLFIRI/Avastin on the [facility] 1317 08/20/2014  Cycle 2 FOLFIRI/Avastin 09/03/2014  Cycle 3 held on 09/17/2014 due to neutropenia  Cycle 3 FOLFIRI/Avastin 09/25/2014. Neulasta added beginning with cycle 3.  Cycle 4 FOLFIRI/Avastin 10/08/2014  Restaging CT scans 10/18/2014 with slight interval increase in the size of the soft tissue mass in the subcutaneous fat of the epigastric region.  Underlying peritoneal implants, probable focus of residual disease along the resected margin of the liver and  hepatoduodenal ligament lymphadenopathy all appeared slightly improved. No new sites of metastatic disease otherwise noted. New left lower lobe bronchopneumonia.  Cycle 5 FOLFIRI/Avastin 10/22/2014  Cycle 6 FOLFIRI/Avastin 11/06/2014  Cycle 7 FOLFIRI Avastin 11/19/2014  Cycle 8 FOLFIRI/Avastin 12/03/2014  CT 12/14/2014 with stable disease  Cycle 9 FOLFIRI/Avastin 12/17/2014  Cycle 10 FOLFIRI/Avastin 01/07/2015 (5-fluorouracil and leucovorin dose reduced)  Cycle 11 FOLFIRI/Avastin 01/21/2015  Cycle 12 FOLFIRI/Avastin 02/04/2015  CT 02/13/2015 with stable disease  Cycle 13 FOLFIRI/Avastin 02/25/2015-changed to an every three-week protocol, now off study 10. Iron deposition within the liver noted on MRI 04/20/2012. The ferritin level was normal on 04/17/2013. Increased iron deposition noted on the left liver resection 01/15/2014 11. Pain/bleeding at the anus reported when here 04/24/2013. Status post evaluation by Dr. [name] with findings of an anal fissure. 12. Right face cystic lesion 13. Status post Port-A-Cath placement 08/14/2014 14. Delayed nausea following FOLFIRI, prophylactic Decadron added with cycle 2 with improvement. 15. Chest CT 10/18/2014 with new left lower lobe bronchopneumonia. Levaquin initiated. Resolved on CT 12/14/2014 16. Gum pain- mucositis?, Gingivitis? 17. Pain/tenderness, mild erythema and full appearance over the maxillary sinuses. Maxillofacial CT 01/30/2015 with mild to moderate bilateral maxillary and sphenoid sinus inflammatory changes. Multifocal right maxillary dental periapical lucency. Status post a right upper tooth extraction with resolution of the pain    Disposition: Bryan Fields has completed 13 cycles of FOLFIRI/Avastin. Treatment schedule changed to every 3 weeks beginning 02/25/2015. On exam today the gums appear diffusely inflamed. Question mucositis. We will hold today's treatment and reschedule for 1 week. We will see him in follow-up  prior to treatment on 03/25/2015. He will contact the office in the interim with any problems.  Patient seen with Dr. Benay Spice.    Ned Card ANP/GNP-BC   03/18/2015  9:50 AM   This was a shared visit with Ned Card. Mr. Staley was interviewed and examined. He has developed gingivitis. This is likely an uncommon toxicity related to Avastin or 5-fluorouracil. We will hold chemotherapy today and he will return for reassessment next week.  Julieanne Manson, M.D.

## 2015-03-20 ENCOUNTER — Ambulatory Visit: Payer: 59

## 2015-03-25 ENCOUNTER — Other Ambulatory Visit: Payer: Self-pay | Admitting: *Deleted

## 2015-03-25 ENCOUNTER — Ambulatory Visit (HOSPITAL_BASED_OUTPATIENT_CLINIC_OR_DEPARTMENT_OTHER): Payer: 59

## 2015-03-25 ENCOUNTER — Telehealth: Payer: Self-pay | Admitting: Oncology

## 2015-03-25 ENCOUNTER — Other Ambulatory Visit (HOSPITAL_BASED_OUTPATIENT_CLINIC_OR_DEPARTMENT_OTHER): Payer: 59

## 2015-03-25 ENCOUNTER — Ambulatory Visit (HOSPITAL_BASED_OUTPATIENT_CLINIC_OR_DEPARTMENT_OTHER): Payer: 59 | Admitting: Oncology

## 2015-03-25 VITALS — BP 128/74 | HR 78 | Temp 98.6°F | Resp 18 | Ht 71.0 in | Wt 249.1 lb

## 2015-03-25 DIAGNOSIS — C189 Malignant neoplasm of colon, unspecified: Secondary | ICD-10-CM

## 2015-03-25 DIAGNOSIS — K1379 Other lesions of oral mucosa: Secondary | ICD-10-CM | POA: Diagnosis not present

## 2015-03-25 DIAGNOSIS — Z5111 Encounter for antineoplastic chemotherapy: Secondary | ICD-10-CM | POA: Diagnosis not present

## 2015-03-25 LAB — COMPREHENSIVE METABOLIC PANEL (CC13)
ALT: 30 U/L (ref 0–55)
AST: 23 U/L (ref 5–34)
Albumin: 3.6 g/dL (ref 3.5–5.0)
Alkaline Phosphatase: 44 U/L (ref 40–150)
Anion Gap: 9 mEq/L (ref 3–11)
BUN: 16.6 mg/dL (ref 7.0–26.0)
CHLORIDE: 108 meq/L (ref 98–109)
CO2: 24 meq/L (ref 22–29)
Calcium: 9.3 mg/dL (ref 8.4–10.4)
Creatinine: 1.1 mg/dL (ref 0.7–1.3)
EGFR: 78 mL/min/{1.73_m2} — AB (ref >90)
GLUCOSE: 137 mg/dL (ref 70–140)
POTASSIUM: 4.4 meq/L (ref 3.5–5.1)
SODIUM: 142 meq/L (ref 136–145)
Total Bilirubin: 0.63 mg/dL (ref 0.20–1.20)
Total Protein: 7 g/dL (ref 6.4–8.3)

## 2015-03-25 LAB — CBC WITH DIFFERENTIAL/PLATELET
BASO%: 0.2 % (ref 0.0–2.0)
BASOS ABS: 0 10*3/uL (ref 0.0–0.1)
EOS ABS: 0.2 10*3/uL (ref 0.0–0.5)
EOS%: 1.7 % (ref 0.0–7.0)
HCT: 42.8 % (ref 38.4–49.9)
HGB: 14.3 g/dL (ref 13.0–17.1)
LYMPH%: 17.6 % (ref 14.0–49.0)
MCH: 35.1 pg — AB (ref 27.2–33.4)
MCHC: 33.4 g/dL (ref 32.0–36.0)
MCV: 105.2 fL — AB (ref 79.3–98.0)
MONO#: 0.9 10*3/uL (ref 0.1–0.9)
MONO%: 8.8 % (ref 0.0–14.0)
NEUT#: 7.6 10*3/uL — ABNORMAL HIGH (ref 1.5–6.5)
NEUT%: 71.7 % (ref 39.0–75.0)
Platelets: 229 10*3/uL (ref 140–400)
RBC: 4.07 10*6/uL — AB (ref 4.20–5.82)
RDW: 14.3 % (ref 11.0–14.6)
WBC: 10.6 10*3/uL — AB (ref 4.0–10.3)
lymph#: 1.9 10*3/uL (ref 0.9–3.3)

## 2015-03-25 LAB — UA PROTEIN, DIPSTICK - CHCC: Protein, ur: NEGATIVE mg/dL

## 2015-03-25 MED ORDER — PALONOSETRON HCL INJECTION 0.25 MG/5ML
0.2500 mg | Freq: Once | INTRAVENOUS | Status: AC
  Administered 2015-03-25: 0.25 mg via INTRAVENOUS

## 2015-03-25 MED ORDER — SODIUM CHLORIDE 0.9 % IV SOLN
1600.0000 mg/m2 | INTRAVENOUS | Status: DC
  Administered 2015-03-25: 3750 mg via INTRAVENOUS
  Filled 2015-03-25: qty 75

## 2015-03-25 MED ORDER — LEUCOVORIN CALCIUM INJECTION 350 MG
240.0000 mg/m2 | Freq: Once | INTRAVENOUS | Status: AC
  Administered 2015-03-25: 562 mg via INTRAVENOUS
  Filled 2015-03-25: qty 28.1

## 2015-03-25 MED ORDER — ATROPINE SULFATE 1 MG/ML IJ SOLN
INTRAMUSCULAR | Status: AC
  Filled 2015-03-25: qty 1

## 2015-03-25 MED ORDER — ATROPINE SULFATE 1 MG/ML IJ SOLN
0.5000 mg | Freq: Once | INTRAMUSCULAR | Status: AC | PRN
  Administered 2015-03-25: 0.5 mg via INTRAVENOUS

## 2015-03-25 MED ORDER — IRINOTECAN HCL CHEMO INJECTION 100 MG/5ML
180.0000 mg/m2 | Freq: Once | INTRAVENOUS | Status: AC
  Administered 2015-03-25: 422 mg via INTRAVENOUS
  Filled 2015-03-25: qty 21.1

## 2015-03-25 MED ORDER — SODIUM CHLORIDE 0.9 % IV SOLN
Freq: Once | INTRAVENOUS | Status: AC
  Administered 2015-03-25: 10:00:00 via INTRAVENOUS

## 2015-03-25 MED ORDER — PALONOSETRON HCL INJECTION 0.25 MG/5ML
INTRAVENOUS | Status: AC
  Filled 2015-03-25: qty 5

## 2015-03-25 MED ORDER — SODIUM CHLORIDE 0.9 % IV SOLN
Freq: Once | INTRAVENOUS | Status: AC
  Administered 2015-03-25: 10:00:00 via INTRAVENOUS
  Filled 2015-03-25: qty 5

## 2015-03-25 MED ORDER — FLUOROURACIL CHEMO INJECTION 2.5 GM/50ML
240.0000 mg/m2 | Freq: Once | INTRAVENOUS | Status: AC
  Administered 2015-03-25: 550 mg via INTRAVENOUS
  Filled 2015-03-25: qty 11

## 2015-03-25 NOTE — Progress Notes (Signed)
[place] OFFICE PROGRESS NOTE   Diagnosis: Colon cancer  INTERVAL HISTORY:   Bryan Fields returns as scheduled. He reports improvement in the bleeding and gum discomfort. He continues to have mild gum bleeding. He has felt well over the past week. He reports malaise and abdominal pain over the week following chemotherapy.  Objective:  Vital signs in last 24 hours:  Blood pressure 128/74, pulse 78, temperature 98.6 F (37 C), temperature source Oral, resp. rate 18, height $RemoveBe'5\' 11"'pOeVyPWFC$  (1.803 m), weight 249 lb 1.6 oz (112.991 kg), SpO2 99 %.    HEENT: There is gum hypertrophy with erythema. No bleeding appreciated. No thrush or oral ulcers Resp: Lungs clear bilaterally Cardio: Regular rate and rhythm GI: No hepatomegaly, no apparent ascites, the abdominal wall mass measures 7 cm in transverse dimension and 6 cm vertically Vascular: No leg edema  Portacath/PICC-without erythema  Lab Results:  Lab Results  Component Value Date   WBC 10.6* 03/25/2015   HGB 14.3 03/25/2015   HCT 42.8 03/25/2015   MCV 105.2* 03/25/2015   PLT 229 03/25/2015   NEUTROABS 7.6* 03/25/2015     Medications: I have reviewed the patient's current medications.  Assessment/Plan: 1. 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 Microsatellite stable; preserved expression of major and minor MMR proteins. He began treatment with FOLFOX in December 2011. He completed cycle #12 on 10/27/2010. Oxaliplatin was held with cycles number 7 through 10 and resumed with cycle #11. 2. History of neutropenia secondary to chemotherapy. 3. History of thrombocytopenia secondary chemotherapy. 4. He did not undergo a preoperative colonoscopy. He underwent a colonoscopy on 12/19/2010 with findings of a 1 cm polyp at 90 cm from the anal verge, minimal polyp at 30 cm from the anal verge and minimal  diverticulosis. Colonoscopy 03/18/2012-negative. 5. 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 He began study drug on 05/13/2010. 6. Status post Port-A-Cath placement. The Port-A-Cath has been removed. 7. 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 9. Low attenuation lesion in the caudate lobe of the liver on the CT 04/11/2012-? Cyst. MRI liver on 04/20/2012 favored the caudate lobe liver lesion to represent a cyst or complex cyst. CT abdomen/pelvis 04/17/2013 with continued enlargement of hypovascular lesion in the caudate lobe of the liver.  PET scan 10/27/2011 confirmed a hypermetabolic caudate lobe liver lesion, tiny indeterminate lung nodules, and a 9 mm level II left neck node   CT biopsy of the liver lesion 11/07/2013 confirmed adenocarcinoma  Left liver and caudate resection 01/15/2014-pathology confirmed metastatic colon cancer, and negative surgical margins  Surveillance CT scans 07/24/2014 consistent with recurrent disease involving upper abdominal lymph nodes, peritoneal implants, and an anterior abdominal wall mass  Status post biopsy of the anterior abdominal wall mass 08/06/2014 with pathology showing metastatic adenocarcinoma consistent with a colon primary.  [id number] 1/28  Cycle 1 FOLFIRI/Avastin on the [facility] 1317 08/20/2014  Cycle 2 FOLFIRI/Avastin 09/03/2014  Cycle 3 held on 09/17/2014 due to neutropenia  Cycle 3 FOLFIRI/Avastin 09/25/2014. Neulasta added beginning with cycle 3.  Cycle 4 FOLFIRI/Avastin 10/08/2014  Restaging CT scans 10/18/2014 with slight interval increase in the size of the soft tissue mass in the subcutaneous fat of the epigastric region. Underlying peritoneal implants, probable focus of residual disease along the resected margin of the  liver and hepatoduodenal ligament  lymphadenopathy all appeared slightly improved. No new sites of metastatic disease otherwise noted. New left lower lobe bronchopneumonia.  Cycle 5 FOLFIRI/Avastin 10/22/2014  Cycle 6 FOLFIRI/Avastin 11/06/2014  Cycle 7 FOLFIRI Avastin 11/19/2014  Cycle 8 FOLFIRI/Avastin 12/03/2014  CT 12/14/2014 with stable disease  Cycle 9 FOLFIRI/Avastin 12/17/2014  Cycle 10 FOLFIRI/Avastin 01/07/2015 (5-fluorouracil and leucovorin dose reduced)  Cycle 11 FOLFIRI/Avastin 01/21/2015  Cycle 12 FOLFIRI/Avastin 02/04/2015  CT 02/13/2015 with stable disease  Cycle 13 FOLFIRI/Avastin 02/25/2015-changed to an every three-week protocol, now off study  Avastin held with cycle 14 FOLFIRI on 03/25/2015 secondary to gingivitis 10. Iron deposition within the liver noted on MRI 04/20/2012. The ferritin level was normal on 04/17/2013. Increased iron deposition noted on the left liver resection 01/15/2014 11. Pain/bleeding at the anus reported when here 04/24/2013. Status post evaluation by Dr. Lucia Fields with findings of an anal fissure. 12. Right face cystic lesion 13. Status post Port-A-Cath placement 08/14/2014 14. Delayed nausea following FOLFIRI, prophylactic Decadron added with cycle 2 with improvement. 15. Chest CT 10/18/2014 with new left lower lobe bronchopneumonia. Levaquin initiated. Resolved on CT 12/14/2014 16. Gum pain- mucositis?, Gingivitis? 17. Pain/tenderness, mild erythema and full appearance over the maxillary sinuses. Maxillofacial CT 01/30/2015 with mild to moderate bilateral maxillary and sphenoid sinus inflammatory changes. Multifocal right maxillary dental periapical lucency. Status post a right upper tooth extraction with resolution of the pain     Disposition:  His overall status appears unchanged. The gum symptoms have improved. It is unclear whether this is related to 5-fluorouracil, Avastin, or both. We decided to proceed with FOLFIRI and hold Avastin with chemotherapy today.  Neulasta will be held since he is now being treated on a 3 week schedule and reports bone pain with Neulasta. Bryan Fields will contact us for increased gum pain or bleeding. He will return for an office visit and chemotherapy in 3 weeks.  Betsy Coder, MD  03/25/2015  9:13 AM

## 2015-03-25 NOTE — Telephone Encounter (Signed)
Gave wife avs report and appointments for October and November. Per patient injection for 10/19 cxd due to he will not be getting injection this time.

## 2015-03-25 NOTE — Patient Instructions (Signed)
[facility] Discharge Instructions for Patients Receiving Chemotherapy  Today you received the following chemotherapy agents: Irinotecan, Leucovorin, Adrucil.  To help prevent nausea and vomiting after your treatment, we encourage you to take your nausea medication as directed.    If you develop nausea and vomiting that is not controlled by your nausea medication, call the clinic.   BELOW ARE SYMPTOMS THAT SHOULD BE REPORTED IMMEDIATELY:  *FEVER GREATER THAN 100.5 F  *CHILLS WITH OR WITHOUT FEVER  NAUSEA AND VOMITING THAT IS NOT CONTROLLED WITH YOUR NAUSEA MEDICATION  *UNUSUAL SHORTNESS OF BREATH  *UNUSUAL BRUISING OR BLEEDING  TENDERNESS IN MOUTH AND THROAT WITH OR WITHOUT PRESENCE OF ULCERS  *URINARY PROBLEMS  *BOWEL PROBLEMS  UNUSUAL RASH Items with * indicate a potential emergency and should be followed up as soon as possible.  Feel free to call the clinic you have any questions or concerns. The clinic phone number is [phone].  Please show the CHEMO ALERT CARD at check-in to the Emergency Department and triage nurse.   

## 2015-03-25 NOTE — Progress Notes (Signed)
Avastin held per Dr. Benay Spice.  Blood return noted before and after Adrucil push.

## 2015-03-26 LAB — CEA: CEA: 1.6 ng/mL (ref 0.0–5.0)

## 2015-03-27 ENCOUNTER — Ambulatory Visit: Payer: 59

## 2015-03-27 ENCOUNTER — Ambulatory Visit (HOSPITAL_BASED_OUTPATIENT_CLINIC_OR_DEPARTMENT_OTHER): Payer: 59

## 2015-03-27 VITALS — BP 140/76 | HR 82 | Temp 98.8°F | Resp 18

## 2015-03-27 DIAGNOSIS — Z452 Encounter for adjustment and management of vascular access device: Secondary | ICD-10-CM | POA: Diagnosis not present

## 2015-03-27 DIAGNOSIS — C189 Malignant neoplasm of colon, unspecified: Secondary | ICD-10-CM | POA: Diagnosis not present

## 2015-03-27 MED ORDER — SODIUM CHLORIDE 0.9 % IJ SOLN
10.0000 mL | INTRAMUSCULAR | Status: DC | PRN
  Administered 2015-03-27: 10 mL
  Filled 2015-03-27: qty 10

## 2015-03-27 MED ORDER — HEPARIN SOD (PORK) LOCK FLUSH 100 UNIT/ML IV SOLN
500.0000 [IU] | Freq: Once | INTRAVENOUS | Status: AC | PRN
  Administered 2015-03-27: 500 [IU]
  Filled 2015-03-27: qty 5

## 2015-03-28 ENCOUNTER — Other Ambulatory Visit: Payer: Self-pay | Admitting: Family Medicine

## 2015-03-28 DIAGNOSIS — Z125 Encounter for screening for malignant neoplasm of prostate: Secondary | ICD-10-CM

## 2015-03-28 DIAGNOSIS — E7800 Pure hypercholesterolemia, unspecified: Secondary | ICD-10-CM

## 2015-03-29 ENCOUNTER — Other Ambulatory Visit (INDEPENDENT_AMBULATORY_CARE_PROVIDER_SITE_OTHER): Payer: 59

## 2015-03-29 DIAGNOSIS — Z125 Encounter for screening for malignant neoplasm of prostate: Secondary | ICD-10-CM | POA: Diagnosis not present

## 2015-03-29 DIAGNOSIS — E7800 Pure hypercholesterolemia, unspecified: Secondary | ICD-10-CM

## 2015-03-29 LAB — PSA: PSA: 0.46 ng/mL (ref 0.10–4.00)

## 2015-03-29 LAB — LIPID PANEL
CHOL/HDL RATIO: 3
Cholesterol: 148 mg/dL (ref 0–200)
HDL: 49.7 mg/dL (ref >39.00)
LDL Cholesterol: 81 mg/dL (ref 0–99)
NONHDL: 98.28
Triglycerides: 88 mg/dL (ref 0.0–149.0)
VLDL: 17.6 mg/dL (ref 0.0–40.0)

## 2015-04-01 ENCOUNTER — Other Ambulatory Visit: Payer: 59

## 2015-04-04 ENCOUNTER — Encounter: Payer: 59 | Admitting: Family Medicine

## 2015-04-11 ENCOUNTER — Ambulatory Visit (INDEPENDENT_AMBULATORY_CARE_PROVIDER_SITE_OTHER): Payer: 59 | Admitting: Family Medicine

## 2015-04-11 ENCOUNTER — Encounter: Payer: Self-pay | Admitting: Family Medicine

## 2015-04-11 VITALS — BP 132/84 | HR 72 | Temp 98.2°F | Ht 71.0 in | Wt 246.5 lb

## 2015-04-11 DIAGNOSIS — C189 Malignant neoplasm of colon, unspecified: Secondary | ICD-10-CM

## 2015-04-11 DIAGNOSIS — E66811 Obesity, class 1: Secondary | ICD-10-CM

## 2015-04-11 DIAGNOSIS — Z0000 Encounter for general adult medical examination without abnormal findings: Secondary | ICD-10-CM

## 2015-04-11 DIAGNOSIS — R222 Localized swelling, mass and lump, trunk: Secondary | ICD-10-CM

## 2015-04-11 DIAGNOSIS — E7800 Pure hypercholesterolemia, unspecified: Secondary | ICD-10-CM | POA: Diagnosis not present

## 2015-04-11 DIAGNOSIS — I10 Essential (primary) hypertension: Secondary | ICD-10-CM | POA: Diagnosis not present

## 2015-04-11 DIAGNOSIS — R1900 Intra-abdominal and pelvic swelling, mass and lump, unspecified site: Secondary | ICD-10-CM

## 2015-04-11 DIAGNOSIS — E669 Obesity, unspecified: Secondary | ICD-10-CM

## 2015-04-11 DIAGNOSIS — R739 Hyperglycemia, unspecified: Secondary | ICD-10-CM

## 2015-04-11 MED ORDER — SIMVASTATIN 40 MG PO TABS: 40.0000 mg | ORAL_TABLET | Freq: Every day | ORAL | Status: DC

## 2015-04-11 MED ORDER — OMEPRAZOLE 40 MG PO CPDR: 40.0000 mg | DELAYED_RELEASE_CAPSULE | Freq: Every day | ORAL | Status: DC

## 2015-04-11 MED ORDER — ATENOLOL 50 MG PO TABS: 50.0000 mg | ORAL_TABLET | Freq: Every day | ORAL | Status: DC

## 2015-04-11 NOTE — Assessment & Plan Note (Signed)
Biopsy confirmed metastatic colon adenocarcinoma. 6cm today in size.

## 2015-04-11 NOTE — Addendum Note (Signed)
Addended by: Ria Bush on: 04/11/2015 01:29 PM   Modules accepted: Orders, SmartSet

## 2015-04-11 NOTE — Assessment & Plan Note (Signed)
Discussed healthy diet and lifestyle changes to affect sustainable weight loss  

## 2015-04-11 NOTE — Patient Instructions (Addendum)
Good to see you today, call us with questions. Look into anti inflammatory foods.  Return as needed or in 1 year for next physical.  Health Maintenance, Male A healthy lifestyle and preventative care can promote health and wellness.  Maintain regular health, dental, and eye exams.  Eat a healthy diet. Foods like vegetables, fruits, whole grains, low-fat dairy products, and lean protein foods contain the nutrients you need and are low in calories. Decrease your intake of foods high in solid fats, added sugars, and salt. Get information about a proper diet from your health care provider, if necessary.  Regular physical exercise is one of the most important things you can do for your health. Most adults should get at least 150 minutes of moderate-intensity exercise (any activity that increases your heart rate and causes you to sweat) each week. In addition, most adults need muscle-strengthening exercises on 2 or more days a week.   Maintain a healthy weight. The body mass index (BMI) is a screening tool to identify possible weight problems. It provides an estimate of body fat based on height and weight. Your health care provider can find your BMI and can help you achieve or maintain a healthy weight. For males 20 years and older:  A BMI below 18.5 is considered underweight.  A BMI of 18.5 to 24.9 is normal.  A BMI of 25 to 29.9 is considered overweight.  A BMI of 30 and above is considered obese.  Maintain normal blood lipids and cholesterol by exercising and minimizing your intake of saturated fat. Eat a balanced diet with plenty of fruits and vegetables. Blood tests for lipids and cholesterol should begin at age 66 and be repeated every 5 years. If your lipid or cholesterol levels are high, you are over age 1, or you are at high risk for heart disease, you may need your cholesterol levels checked more frequently.Ongoing high lipid and cholesterol levels should be treated with medicines if  diet and exercise are not working.  If you smoke, find out from your health care provider how to quit. If you do not use tobacco, do not start.  Lung cancer screening is recommended for adults aged 33-80 years who are at high risk for developing lung cancer because of a history of smoking. A yearly low-dose CT scan of the lungs is recommended for people who have at least a 30-pack-year history of smoking and are current smokers or have quit within the past 15 years. A pack year of smoking is smoking an average of 1 pack of cigarettes a day for 1 year (for example, a 30-pack-year history of smoking could mean smoking 1 pack a day for 30 years or 2 packs a day for 15 years). Yearly screening should continue until the smoker has stopped smoking for at least 15 years. Yearly screening should be stopped for people who develop a health problem that would prevent them from having lung cancer treatment.  If you choose to drink alcohol, do not have more than 2 drinks per day. One drink is considered to be 12 oz (360 mL) of beer, 5 oz (150 mL) of wine, or 1.5 oz (45 mL) of liquor.  Avoid the use of street drugs. Do not share needles with anyone. Ask for help if you need support or instructions about stopping the use of drugs.  High blood pressure causes heart disease and increases the risk of stroke. High blood pressure is more likely to develop in:  People who have  blood pressure in the end of the normal range (100-139/85-89 mm Hg).  People who are overweight or obese.  People who are African American.  If you are 54-63 years of age, have your blood pressure checked every 3-5 years. If you are 39 years of age or older, have your blood pressure checked every year. You should have your blood pressure measured twice--once when you are at a hospital or clinic, and once when you are not at a hospital or clinic. Record the average of the two measurements. To check your blood pressure when you are not at a  hospital or clinic, you can use:  An automated blood pressure machine at a pharmacy.  A home blood pressure monitor.  If you are 79-46 years old, ask your health care provider if you should take aspirin to prevent heart disease.  Diabetes screening involves taking a blood sample to check your fasting blood sugar level. This should be done once every 3 years after age 10 if you are at a normal weight and without risk factors for diabetes. Testing should be considered at a younger age or be carried out more frequently if you are overweight and have at least 1 risk factor for diabetes.  Colorectal cancer can be detected and often prevented. Most routine colorectal cancer screening begins at the age of 64 and continues through age 57. However, your health care provider may recommend screening at an earlier age if you have risk factors for colon cancer. On a yearly basis, your health care provider may provide home test kits to check for hidden blood in the stool. A small camera at the end of a tube may be used to directly examine the colon (sigmoidoscopy or colonoscopy) to detect the earliest forms of colorectal cancer. Talk to your health care provider about this at age 81 when routine screening begins. A direct exam of the colon should be repeated every 5-10 years through age 91, unless early forms of precancerous polyps or small growths are found.  People who are at an increased risk for hepatitis B should be screened for this virus. You are considered at high risk for hepatitis B if:  You were born in a country where hepatitis B occurs often. Talk with your health care provider about which countries are considered high risk.  Your parents were born in a high-risk country and you have not received a shot to protect against hepatitis B (hepatitis B vaccine).  You have HIV or AIDS.  You use needles to inject street drugs.  You live with, or have sex with, someone who has hepatitis B.  You are a  man who has sex with other men (MSM).  You get hemodialysis treatment.  You take certain medicines for conditions like cancer, organ transplantation, and autoimmune conditions.  Hepatitis C blood testing is recommended for all people born from [birth year] through 1967 and any individual with known risk factors for hepatitis C.  Healthy men should no longer receive prostate-specific antigen (PSA) blood tests as part of routine cancer screening. Talk to your health care provider about prostate cancer screening.  Testicular cancer screening is not recommended for adolescents or adult males who have no symptoms. Screening includes self-exam, a health care provider exam, and other screening tests. Consult with your health care provider about any symptoms you have or any concerns you have about testicular cancer.  Practice safe sex. Use condoms and avoid high-risk sexual practices to reduce the spread of sexually transmitted infections (  STIs).  You should be screened for STIs, including gonorrhea and chlamydia if:  You are sexually active and are younger than 24 years.  You are older than 24 years, and your health care provider tells you that you are at risk for this type of infection.  Your sexual activity has changed since you were last screened, and you are at an increased risk for chlamydia or gonorrhea. Ask your health care provider if you are at risk.  If you are at risk of being infected with HIV, it is recommended that you take a prescription medicine daily to prevent HIV infection. This is called pre-exposure prophylaxis (PrEP). You are considered at risk if:  You are a man who has sex with other men (MSM).  You are a heterosexual man who is sexually active with multiple partners.  You take drugs by injection.  You are sexually active with a partner who has HIV.  Talk with your health care provider about whether you are at high risk of being infected with HIV. If you choose to begin PrEP,  you should first be tested for HIV. You should then be tested every 3 months for as long as you are taking PrEP.  Use sunscreen. Apply sunscreen liberally and repeatedly throughout the day. You should seek shade when your shadow is shorter than you. Protect yourself by wearing long sleeves, pants, a wide-brimmed hat, and sunglasses year round whenever you are outdoors.  Tell your health care provider of new moles or changes in moles, especially if there is a change in shape or color. Also, tell your health care provider if a mole is larger than the size of a pencil eraser.  A one-time screening for abdominal aortic aneurysm (AAA) and surgical repair of large AAAs by ultrasound is recommended for men aged 75-75 years who are current or former smokers.  Stay current with your vaccines (immunizations).   This information is not intended to replace advice given to you by your health care provider. Make sure you discuss any questions you have with your health care provider.   Document Released: 11/21/2007 Document Revised: 06/15/2014 Document Reviewed: 10/20/2010 Elsevier Interactive Patient Education [employer].

## 2015-04-11 NOTE — Assessment & Plan Note (Signed)
Preventative protocols reviewed and updated unless pt declined. Discussed healthy diet and lifestyle.  

## 2015-04-11 NOTE — Assessment & Plan Note (Signed)
Chronic, stable. Continue atenolol.

## 2015-04-11 NOTE — Progress Notes (Signed)
Pre visit review using our clinic review tool, if applicable. No additional management support is needed unless otherwise documented below in the visit note. 

## 2015-04-11 NOTE — Assessment & Plan Note (Signed)
Check A1c next labwork. 

## 2015-04-11 NOTE — Assessment & Plan Note (Signed)
Chronic, stable. Continue fish oil and simvastatin.  Discussed pro vs anti-inflammatory foods.

## 2015-04-11 NOTE — Progress Notes (Signed)
BP 132/84 mmHg  Pulse 72  Temp(Src) 98.2 F (36.8 C) (Oral)  Ht 5\' 11"  (1.803 m)  Wt 246 lb 8 oz (111.812 kg)  BMI 34.40 kg/m2   CC: CPE  Subjective:    Patient ID: Bryan Fields, male    DOB: 1961-06-08, 54 y.o.   MRN: [id number]  HPI: Bryan Fields is a 54 y.o. 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 His final pathology showed a 6.5 cm adenocarcinoma with 2 of 15 nodes positive. (T3N1). He has seen Dr. Lorrin Mais who completed his chemotherapy in May 2012. Also followed by surgery Dr Barry Dienes. Recently underwent dx laparoscopy and L hepatectomy 01/2014. Pathology confirmed metastatic adenocarcinoma. Surveillance CT scan 07/2014 showed recurrent metastatic colon disease (upper abd LN, peritoneal implants, anterior abd wall mass). Latest stage is IIIB (pT2 N1b). Back on chemotherapy Q3 wks FOLFIRI/Avastin. LLL pneumonia 10/2014 treated with levaquin. portacath in place.   Biggest issue with chemo currently is bleeding. Change in chemo regimen has helped.   Preventative: Prostate cancer screening - went to urologist told slightly swollen prostate. Rare nocturia. Strong stream. Will screen today.  Colon cancer - see above.  Flu shot - yearly Tetanus 2009.  Seat belt use discussed.  Sunscreen use discussed. No suspicious spots on skin.   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 No regular exercise.  Diet: some vegetables/fruits. Good amt water   Relevant past medical, surgical, family and social history reviewed and updated as indicated. Interim medical history since our last visit reviewed. Allergies and medications reviewed and updated. Current Outpatient Prescriptions on File Prior to Visit  Medication Sig  . atenolol (TENORMIN) 50 MG tablet TAKE  1 TABLET EVERY DAY  . Cyanocobalamin (VITAMIN B-12) 2500 MCG SUBL Place 1 tablet under the tongue daily.  Marland Kitchen dexamethasone (DECADRON) 4 MG tablet TAKE 1 TABLET BY MOUTH TWICE A DAY. START DAY AFTER CHEMO FOR 3 DAYS.  Marland Kitchen lidocaine-prilocaine (EMLA) cream Apply to port site one hour prior to use. Do not rub in. Cover with plastic.  Marland Kitchen loratadine (CLARITIN) 5 MG chewable tablet Chew 5 mg by mouth daily.  . Melatonin 5 MG SUBL Place 10 mg under the tongue at bedtime.  . naproxen sodium (ANAPROX) 220 MG tablet Take 220 mg by mouth 2 (two) times daily as needed.   . Omega-3 Fatty Acids (OMEGA 3 PO) Take 1,400 mg by mouth 2 (two) times daily.  Marland Kitchen omeprazole (PRILOSEC) 40 MG capsule Take 1 capsule (40 mg total) by mouth daily.  . prochlorperazine (COMPAZINE) 10 MG tablet TAKE 1 TABLET (10 MG TOTAL) BY MOUTH EVERY 6 (SIX) HOURS AS NEEDED FOR NAUSEA OR VOMITING.  . Pseudoephedrine-Naproxen Na ([employer] SINUS & COLD-D PO) Take 2 tablets by mouth daily as needed.  . sildenafil (VIAGRA) 100 MG tablet Take 100 mg by mouth daily as needed for erectile dysfunction.  . simvastatin (ZOCOR) 40 MG tablet TAKE 1 TABLET AT BEDTIME   No current facility-administered medications on file prior to visit.    Review of Systems  Constitutional: Positive for chills (chemo related). Negative for fever, activity change, appetite change, fatigue and unexpected weight change.  HENT: Negative for hearing loss.   Eyes: Negative for visual disturbance.  Respiratory: Negative for cough, chest tightness, shortness of breath  and wheezing.   Cardiovascular: Negative for chest pain, palpitations and leg swelling.  Gastrointestinal: Positive for abdominal pain (chemo related), diarrhea (chemo related) and blood in stool (anal fissure). Negative for nausea, vomiting, constipation and abdominal distention.  Genitourinary: Negative for hematuria and difficulty urinating.  Musculoskeletal: Negative for myalgias, arthralgias and neck pain.  Skin:  Negative for rash.  Neurological: Positive for headaches (chemo related). Negative for dizziness, seizures and syncope.  Hematological: Negative for adenopathy. Bruises/bleeds easily (gums).  Psychiatric/Behavioral: Negative for dysphoric mood. The patient is not nervous/anxious.    Per HPI unless specifically indicated in ROS section     Objective:    BP 132/84 mmHg  Pulse 72  Temp(Src) 98.2 F (36.8 C) (Oral)  Ht 5\' 11"  (1.803 m)  Wt 246 lb 8 oz (111.812 kg)  BMI 34.40 kg/m2  Wt Readings from Last 3 Encounters:  04/11/15 246 lb 8 oz (111.812 kg)  03/25/15 249 lb 1.6 oz (112.991 kg)  03/18/15 248 lb 14.4 oz (112.9 kg)    Physical Exam  Constitutional: He is oriented to person, place, and time. He appears well-developed and well-nourished. No distress.  HENT:  Head: Normocephalic and atraumatic.  Right Ear: Hearing, tympanic membrane, external ear and ear canal normal.  Left Ear: Hearing, tympanic membrane, external ear and ear canal normal.  Nose: Nose normal.  Mouth/Throat: Uvula is midline, oropharynx is clear and moist and mucous membranes are normal. No oropharyngeal exudate, posterior oropharyngeal edema or posterior oropharyngeal erythema.  Eyes: Conjunctivae and EOM are normal. Pupils are equal, round, and reactive to light. No scleral icterus.  Neck: Normal range of motion. Neck supple. No thyromegaly present.  Cardiovascular: Normal rate, regular rhythm, normal heart sounds and intact distal pulses.   No murmur heard. Pulses:      Radial pulses are 2+ on the right side, and 2+ on the left side.  Pulmonary/Chest: Effort normal and breath sounds normal. No respiratory distress. He has no wheezes. He has no rales.  Abdominal: Soft. Bowel sounds are normal. He exhibits mass (6cm epigastric superficial abd wall mass). He exhibits no distension. There is no tenderness. There is no rebound and no guarding.  Genitourinary: Rectum normal and prostate normal. Rectal exam shows  no external hemorrhoid, no internal hemorrhoid, no fissure, no mass, no tenderness and anal tone normal. Prostate is not enlarged (15gm) and not tender.  Musculoskeletal: Normal range of motion. He exhibits no edema.  Lymphadenopathy:    He has no cervical adenopathy.  Neurological: He is alert and oriented to person, place, and time.  CN grossly intact, station and gait intact  Skin: Skin is warm and dry. No rash noted.  Psychiatric: He has a normal mood and affect. His behavior is normal. Judgment and thought content normal.  Nursing note and vitals reviewed.  Results for orders placed or performed in visit on 03/29/15  Lipid panel  Result Value Ref Range   Cholesterol 148 0 - 200 mg/dL   Triglycerides 88.0 0.0 - 149.0 mg/dL   HDL 49.70 >39.00 mg/dL   VLDL 17.6 0.0 - 40.0 mg/dL   LDL Cholesterol 81 0 - 99 mg/dL   Total CHOL/HDL Ratio 3    NonHDL 98.28   PSA  Result Value Ref Range   PSA 0.46 0.10 - 4.00 ng/mL      Assessment & Plan:   Problem List Items Addressed This Visit    Obesity, Class I, BMI 30-34.9    Discussed healthy diet and lifestyle changes  to affect sustainable weight loss.      Malignant neoplasm of colon ([place])    Appreciate onc care. Continue chemo Q3wks.       Hyperglycemia    Check A1c next labwork.      HYPERCHOLESTEROLEMIA    Chronic, stable. Continue fish oil and simvastatin.  Discussed pro vs anti-inflammatory foods.       Healthcare maintenance - Primary    Preventative protocols reviewed and updated unless pt declined. Discussed healthy diet and lifestyle.       Essential hypertension    Chronic, stable. Continue atenolol.       Abdominal wall mass    Biopsy confirmed metastatic colon adenocarcinoma. 6cm today in size.          Follow up plan: Return in about 1 year (around 04/10/2016), or as needed, for annual exam, prior fasting for blood work.

## 2015-04-11 NOTE — Assessment & Plan Note (Signed)
Appreciate onc care. Continue chemo Q3wks.

## 2015-04-14 ENCOUNTER — Other Ambulatory Visit: Payer: Self-pay | Admitting: Oncology

## 2015-04-15 ENCOUNTER — Ambulatory Visit (HOSPITAL_BASED_OUTPATIENT_CLINIC_OR_DEPARTMENT_OTHER): Payer: 59 | Admitting: Oncology

## 2015-04-15 ENCOUNTER — Ambulatory Visit (HOSPITAL_BASED_OUTPATIENT_CLINIC_OR_DEPARTMENT_OTHER): Payer: 59

## 2015-04-15 ENCOUNTER — Other Ambulatory Visit (HOSPITAL_BASED_OUTPATIENT_CLINIC_OR_DEPARTMENT_OTHER): Payer: 59

## 2015-04-15 ENCOUNTER — Telehealth: Payer: Self-pay | Admitting: Oncology

## 2015-04-15 VITALS — BP 134/75 | HR 66 | Temp 98.5°F | Resp 16 | Ht 71.0 in | Wt 247.8 lb

## 2015-04-15 DIAGNOSIS — Z5111 Encounter for antineoplastic chemotherapy: Secondary | ICD-10-CM

## 2015-04-15 DIAGNOSIS — C7989 Secondary malignant neoplasm of other specified sites: Secondary | ICD-10-CM

## 2015-04-15 DIAGNOSIS — C189 Malignant neoplasm of colon, unspecified: Secondary | ICD-10-CM

## 2015-04-15 DIAGNOSIS — C182 Malignant neoplasm of ascending colon: Secondary | ICD-10-CM

## 2015-04-15 DIAGNOSIS — C787 Secondary malignant neoplasm of liver and intrahepatic bile duct: Secondary | ICD-10-CM | POA: Diagnosis not present

## 2015-04-15 LAB — COMPREHENSIVE METABOLIC PANEL (CC13)
ALBUMIN: 3.4 g/dL — AB (ref 3.5–5.0)
ALK PHOS: 44 U/L (ref 40–150)
ALT: 24 U/L (ref 0–55)
AST: 23 U/L (ref 5–34)
Anion Gap: 8 mEq/L (ref 3–11)
BUN: 15.2 mg/dL (ref 7.0–26.0)
CO2: 24 meq/L (ref 22–29)
Calcium: 9.1 mg/dL (ref 8.4–10.4)
Chloride: 109 mEq/L (ref 98–109)
Creatinine: 1.1 mg/dL (ref 0.7–1.3)
EGFR: 74 mL/min/{1.73_m2} — AB (ref >90)
GLUCOSE: 135 mg/dL (ref 70–140)
POTASSIUM: 4.1 meq/L (ref 3.5–5.1)
SODIUM: 141 meq/L (ref 136–145)
TOTAL PROTEIN: 6.6 g/dL (ref 6.4–8.3)
Total Bilirubin: 0.42 mg/dL (ref 0.20–1.20)

## 2015-04-15 LAB — CBC WITH DIFFERENTIAL/PLATELET
BASO%: 0.5 % (ref 0.0–2.0)
Basophils Absolute: 0 10*3/uL (ref 0.0–0.1)
EOS%: 4.3 % (ref 0.0–7.0)
Eosinophils Absolute: 0.2 10*3/uL (ref 0.0–0.5)
HCT: 41.4 % (ref 38.4–49.9)
HGB: 13.9 g/dL (ref 13.0–17.1)
LYMPH%: 28.2 % (ref 14.0–49.0)
MCH: 34.4 pg — AB (ref 27.2–33.4)
MCHC: 33.6 g/dL (ref 32.0–36.0)
MCV: 102.2 fL — AB (ref 79.3–98.0)
MONO#: 0.5 10*3/uL (ref 0.1–0.9)
MONO%: 10.4 % (ref 0.0–14.0)
NEUT%: 56.6 % (ref 39.0–75.0)
NEUTROS ABS: 2.9 10*3/uL (ref 1.5–6.5)
Platelets: 207 10*3/uL (ref 140–400)
RBC: 4.05 10*6/uL — AB (ref 4.20–5.82)
RDW: 14.5 % (ref 11.0–14.6)
WBC: 5.2 10*3/uL (ref 4.0–10.3)
lymph#: 1.5 10*3/uL (ref 0.9–3.3)

## 2015-04-15 MED ORDER — PALONOSETRON HCL INJECTION 0.25 MG/5ML
INTRAVENOUS | Status: AC
  Filled 2015-04-15: qty 5

## 2015-04-15 MED ORDER — ATROPINE SULFATE 1 MG/ML IJ SOLN
INTRAMUSCULAR | Status: AC
  Filled 2015-04-15: qty 1

## 2015-04-15 MED ORDER — ATROPINE SULFATE 1 MG/ML IJ SOLN
0.5000 mg | Freq: Once | INTRAMUSCULAR | Status: AC | PRN
  Administered 2015-04-15: 0.5 mg via INTRAVENOUS

## 2015-04-15 MED ORDER — SODIUM CHLORIDE 0.9 % IV SOLN
Freq: Once | INTRAVENOUS | Status: AC
  Administered 2015-04-15: 10:00:00 via INTRAVENOUS

## 2015-04-15 MED ORDER — SODIUM CHLORIDE 0.9 % IV SOLN
Freq: Once | INTRAVENOUS | Status: AC
  Administered 2015-04-15: 10:00:00 via INTRAVENOUS
  Filled 2015-04-15: qty 5

## 2015-04-15 MED ORDER — IRINOTECAN HCL CHEMO INJECTION 100 MG/5ML
180.0000 mg/m2 | Freq: Once | INTRAVENOUS | Status: AC
  Administered 2015-04-15: 422 mg via INTRAVENOUS
  Filled 2015-04-15: qty 21.1

## 2015-04-15 MED ORDER — FLUOROURACIL CHEMO INJECTION 2.5 GM/50ML
240.0000 mg/m2 | Freq: Once | INTRAVENOUS | Status: AC
  Administered 2015-04-15: 550 mg via INTRAVENOUS
  Filled 2015-04-15: qty 11

## 2015-04-15 MED ORDER — SODIUM CHLORIDE 0.9 % IV SOLN
1600.0000 mg/m2 | INTRAVENOUS | Status: DC
  Administered 2015-04-15: 3750 mg via INTRAVENOUS
  Filled 2015-04-15: qty 75

## 2015-04-15 MED ORDER — PALONOSETRON HCL INJECTION 0.25 MG/5ML
0.2500 mg | Freq: Once | INTRAVENOUS | Status: AC
  Administered 2015-04-15: 0.25 mg via INTRAVENOUS

## 2015-04-15 MED ORDER — LEUCOVORIN CALCIUM INJECTION 350 MG
240.0000 mg/m2 | Freq: Once | INTRAVENOUS | Status: AC
  Administered 2015-04-15: 562 mg via INTRAVENOUS
  Filled 2015-04-15: qty 28.1

## 2015-04-15 MED ORDER — BEVACIZUMAB CHEMO INJECTION 400 MG/16ML: 7.5000 mg/kg | Freq: Once | INTRAVENOUS | Status: DC

## 2015-04-15 NOTE — Progress Notes (Signed)
[facility] OFFICE PROGRESS NOTE   Diagnosis: Colon cancer  INTERVAL HISTORY:   Bryan Fields returns as scheduled. He completed another cycle of FOLFIRI 03/25/2015. He reports less nausea following this cycle of chemotherapy. He continues to have bleeding from the gums, but the pain and hypertrophy have improved. Good appetite and energy level.  Objective:  Vital signs in last 24 hours:  Blood pressure 134/75, pulse 66, temperature 98.5 F (36.9 C), temperature source Oral, resp. rate 16, height $RemoveBe'5\' 11"'bPJfYHEpQ$  (1.803 m), weight 247 lb 12.8 oz (112.401 kg), SpO2 98 %.    HEENT: No thrush or ulcers, gums without bleeding or erythema Resp: Lungs clear bilaterally Cardio: Regular rate and rhythm GI: No hepatosplenomegaly, upper abdominal wall mass measures 6.5 cm in transverse dimension and 6 cm vertically Vascular: No leg edema  Skin: Drying pustular rash over the trunk   Portacath/PICC-without erythema  Lab Results:  Lab Results  Component Value Date   WBC 5.2 04/15/2015   HGB 13.9 04/15/2015   HCT 41.4 04/15/2015   MCV 102.2* 04/15/2015   PLT 207 04/15/2015   NEUTROABS 2.9 04/15/2015      Lab Results  Component Value Date   CEA 1.6 03/25/2015    Medications: I have reviewed the patient's current medications.  Assessment/Plan: 1.  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 Microsatellite stable; preserved expression of major and minor MMR proteins. He began treatment with FOLFOX in December 2011. He completed cycle #12 on 10/27/2010. Oxaliplatin was held with cycles number 7 through 10 and resumed with cycle #11. 2. History of neutropenia secondary to chemotherapy. 3. History of thrombocytopenia secondary chemotherapy. 4. He did not undergo a preoperative colonoscopy. He underwent a colonoscopy on 12/19/2010 with findings of a 1 cm polyp at 90 cm from  the anal verge, minimal polyp at 30 cm from the anal verge and minimal diverticulosis. Colonoscopy 03/18/2012-negative. 5. 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 He began study drug on 05/13/2010. 6. Status post Port-A-Cath placement. The Port-A-Cath has been removed. 7. 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 9. Low attenuation lesion in the caudate lobe of the liver on the CT 04/11/2012-? Cyst. MRI liver on 04/20/2012 favored the caudate lobe liver lesion to represent a cyst or complex cyst. CT abdomen/pelvis 04/17/2013 with continued enlargement of hypovascular lesion in the caudate lobe of the liver.  PET scan 10/27/2011 confirmed a hypermetabolic caudate lobe liver lesion, tiny indeterminate lung nodules, and a 9 mm level II left neck node   CT biopsy of the liver lesion 11/07/2013 confirmed adenocarcinoma  Left liver and caudate resection 01/15/2014-pathology confirmed metastatic colon cancer, and negative surgical margins  Surveillance CT scans 07/24/2014 consistent with recurrent disease involving upper abdominal lymph nodes, peritoneal implants, and an anterior abdominal wall mass  Status post biopsy of the anterior abdominal wall mass 08/06/2014 with pathology showing metastatic adenocarcinoma consistent with a colon primary.  [id number] 1/28  Cycle 1 FOLFIRI/Avastin on the [facility] 1317 08/20/2014  Cycle 2 FOLFIRI/Avastin 09/03/2014  Cycle 3 held on 09/17/2014 due to neutropenia  Cycle 3 FOLFIRI/Avastin 09/25/2014. Neulasta added beginning with cycle 3.  Cycle 4 FOLFIRI/Avastin 10/08/2014  Restaging CT scans 10/18/2014 with slight interval increase in the size of the soft tissue mass in the subcutaneous fat  of the epigastric region. Underlying peritoneal implants, probable focus of residual disease along the  resected margin of the liver and hepatoduodenal ligament lymphadenopathy all appeared slightly improved. No new sites of metastatic disease otherwise noted. New left lower lobe bronchopneumonia.  Cycle 5 FOLFIRI/Avastin 10/22/2014  Cycle 6 FOLFIRI/Avastin 11/06/2014  Cycle 7 FOLFIRI Avastin 11/19/2014  Cycle 8 FOLFIRI/Avastin 12/03/2014  CT 12/14/2014 with stable disease  Cycle 9 FOLFIRI/Avastin 12/17/2014  Cycle 10 FOLFIRI/Avastin 01/07/2015 (5-fluorouracil and leucovorin dose reduced)  Cycle 11 FOLFIRI/Avastin 01/21/2015  Cycle 12 FOLFIRI/Avastin 02/04/2015  CT 02/13/2015 with stable disease  Cycle 13 FOLFIRI/Avastin 02/25/2015-changed to an every three-week protocol, now off study  Avastin held with cycle 14 FOLFIRI on 03/25/2015 secondary to gingivitis 10. Iron deposition within the liver noted on MRI 04/20/2012. The ferritin level was normal on 04/17/2013. Increased iron deposition noted on the left liver resection 01/15/2014 11. Pain/bleeding at the anus reported when here 04/24/2013. Status post evaluation by Dr. [name] with findings of an anal fissure. 12. Right face cystic lesion 13. Status post Port-A-Cath placement 08/14/2014 14. Delayed nausea following FOLFIRI, prophylactic Decadron added with cycle 2 with improvement. 15. Chest CT 10/18/2014 with new left lower lobe bronchopneumonia. Levaquin initiated. Resolved on CT 12/14/2014 16. Gum pain- mucositis?, Gingivitis?-Improved with discontinuation of Avastin 17. Pain/tenderness, mild erythema and full appearance over the maxillary sinuses. Maxillofacial CT 01/30/2015 with mild to moderate bilateral maxillary and sphenoid sinus inflammatory changes. Multifocal right maxillary dental periapical lucency. Status post a right upper tooth extraction with resolution of the pain   Disposition:  Bryan Fields continues treatment with FOLFIRI. The gum symptoms have improved with discontinuation of Avastin. He will complete  another cycle of FOLFIRI today. We will consider resuming Avastin when he returns in 3 weeks. The plan is to schedule a restaging CT evaluation after the cycle of chemotherapy scheduled for 05/06/2015.  Betsy Coder, MD  04/15/2015  5:09 PM

## 2015-04-15 NOTE — Telephone Encounter (Signed)
gv adn printed appt sched and avs for pt for NOV and DEC.... °

## 2015-04-15 NOTE — Patient Instructions (Signed)
[facility] Discharge Instructions for Patients Receiving Chemotherapy  Today you received the following chemotherapy agents Leucovorin/Camptosar/5-FU  To help prevent nausea and vomiting after your treatment, we encourage you to take your nausea medication     If you develop nausea and vomiting that is not controlled by your nausea medication, call the clinic.   BELOW ARE SYMPTOMS THAT SHOULD BE REPORTED IMMEDIATELY:  *FEVER GREATER THAN 100.5 F  *CHILLS WITH OR WITHOUT FEVER  NAUSEA AND VOMITING THAT IS NOT CONTROLLED WITH YOUR NAUSEA MEDICATION  *UNUSUAL SHORTNESS OF BREATH  *UNUSUAL BRUISING OR BLEEDING  TENDERNESS IN MOUTH AND THROAT WITH OR WITHOUT PRESENCE OF ULCERS  *URINARY PROBLEMS  *BOWEL PROBLEMS  UNUSUAL RASH Items with * indicate a potential emergency and should be followed up as soon as possible.  Feel free to call the clinic you have any questions or concerns. The clinic phone number is [phone].  Please show the [facility] at check-in to the Emergency Department and triage nurse.

## 2015-04-17 ENCOUNTER — Ambulatory Visit: Payer: 59

## 2015-04-17 ENCOUNTER — Ambulatory Visit (HOSPITAL_BASED_OUTPATIENT_CLINIC_OR_DEPARTMENT_OTHER): Payer: 59

## 2015-04-17 VITALS — BP 151/86 | HR 67 | Temp 98.3°F | Resp 18

## 2015-04-17 DIAGNOSIS — C182 Malignant neoplasm of ascending colon: Secondary | ICD-10-CM

## 2015-04-17 DIAGNOSIS — Z452 Encounter for adjustment and management of vascular access device: Secondary | ICD-10-CM

## 2015-04-17 DIAGNOSIS — C189 Malignant neoplasm of colon, unspecified: Secondary | ICD-10-CM

## 2015-04-17 MED ORDER — HEPARIN SOD (PORK) LOCK FLUSH 100 UNIT/ML IV SOLN
500.0000 [IU] | Freq: Once | INTRAVENOUS | Status: AC | PRN
  Administered 2015-04-17: 500 [IU]
  Filled 2015-04-17: qty 5

## 2015-04-17 MED ORDER — SODIUM CHLORIDE 0.9 % IJ SOLN
10.0000 mL | INTRAMUSCULAR | Status: DC | PRN
  Administered 2015-04-17: 10 mL
  Filled 2015-04-17: qty 10

## 2015-04-17 NOTE — Patient Instructions (Signed)
[facility] Discharge Instructions for Patients Receiving Chemotherapy  Today you received the following chemotherapy agents 5FU pump  To help prevent nausea and vomiting after your treatment, we encourage you to take your nausea medication zofran 8mg  as needed If you develop nausea and vomiting that is not controlled by your nausea medication, call the clinic.   BELOW ARE SYMPTOMS THAT SHOULD BE REPORTED IMMEDIATELY:  *FEVER GREATER THAN 100.5 F  *CHILLS WITH OR WITHOUT FEVER  NAUSEA AND VOMITING THAT IS NOT CONTROLLED WITH YOUR NAUSEA MEDICATION  *UNUSUAL SHORTNESS OF BREATH  *UNUSUAL BRUISING OR BLEEDING  TENDERNESS IN MOUTH AND THROAT WITH OR WITHOUT PRESENCE OF ULCERS  *URINARY PROBLEMS  *BOWEL PROBLEMS  UNUSUAL RASH Items with * indicate a potential emergency and should be followed up as soon as possible.  Feel free to call the clinic you have any questions or concerns. The clinic phone number is [phone].  Please show the [facility] at check-in to the Emergency Department and triage nurse.

## 2015-04-17 NOTE — Progress Notes (Signed)
Per Dr. Benay Spice: No Neulasta this cycle. Pt aware.

## 2015-05-05 ENCOUNTER — Other Ambulatory Visit: Payer: Self-pay | Admitting: Oncology

## 2015-05-06 ENCOUNTER — Ambulatory Visit (HOSPITAL_BASED_OUTPATIENT_CLINIC_OR_DEPARTMENT_OTHER): Payer: 59

## 2015-05-06 ENCOUNTER — Encounter: Payer: Self-pay | Admitting: Medical Oncology

## 2015-05-06 ENCOUNTER — Telehealth: Payer: Self-pay | Admitting: Nurse Practitioner

## 2015-05-06 ENCOUNTER — Ambulatory Visit (HOSPITAL_BASED_OUTPATIENT_CLINIC_OR_DEPARTMENT_OTHER): Payer: 59 | Admitting: Nurse Practitioner

## 2015-05-06 ENCOUNTER — Encounter: Payer: Self-pay | Admitting: Nurse Practitioner

## 2015-05-06 ENCOUNTER — Other Ambulatory Visit (HOSPITAL_BASED_OUTPATIENT_CLINIC_OR_DEPARTMENT_OTHER): Payer: 59

## 2015-05-06 VITALS — BP 138/80 | HR 65 | Temp 98.3°F | Resp 18 | Ht 71.0 in | Wt 248.5 lb

## 2015-05-06 DIAGNOSIS — G620 Drug-induced polyneuropathy: Secondary | ICD-10-CM

## 2015-05-06 DIAGNOSIS — K055 Other periodontal diseases: Secondary | ICD-10-CM

## 2015-05-06 DIAGNOSIS — C189 Malignant neoplasm of colon, unspecified: Secondary | ICD-10-CM

## 2015-05-06 DIAGNOSIS — Z5111 Encounter for antineoplastic chemotherapy: Secondary | ICD-10-CM | POA: Diagnosis not present

## 2015-05-06 DIAGNOSIS — K068 Other specified disorders of gingiva and edentulous alveolar ridge: Secondary | ICD-10-CM

## 2015-05-06 DIAGNOSIS — R530 Neoplastic (malignant) related fatigue: Secondary | ICD-10-CM | POA: Diagnosis not present

## 2015-05-06 DIAGNOSIS — T451X5A Adverse effect of antineoplastic and immunosuppressive drugs, initial encounter: Secondary | ICD-10-CM

## 2015-05-06 LAB — CBC WITH DIFFERENTIAL/PLATELET
BASO%: 0.6 % (ref 0.0–2.0)
BASOS ABS: 0 10*3/uL (ref 0.0–0.1)
EOS%: 2 % (ref 0.0–7.0)
Eosinophils Absolute: 0.1 10*3/uL (ref 0.0–0.5)
HCT: 41.5 % (ref 38.4–49.9)
HGB: 14.1 g/dL (ref 13.0–17.1)
LYMPH%: 37.2 % (ref 14.0–49.0)
MCH: 34.6 pg — AB (ref 27.2–33.4)
MCHC: 34 g/dL (ref 32.0–36.0)
MCV: 101.8 fL — ABNORMAL HIGH (ref 79.3–98.0)
MONO#: 0.7 10*3/uL (ref 0.1–0.9)
MONO%: 11.4 % (ref 0.0–14.0)
NEUT#: 2.9 10*3/uL (ref 1.5–6.5)
NEUT%: 48.8 % (ref 39.0–75.0)
Platelets: 168 10*3/uL (ref 140–400)
RBC: 4.08 10*6/uL — AB (ref 4.20–5.82)
RDW: 14.4 % (ref 11.0–14.6)
WBC: 5.9 10*3/uL (ref 4.0–10.3)
lymph#: 2.2 10*3/uL (ref 0.9–3.3)

## 2015-05-06 LAB — UA PROTEIN, DIPSTICK - CHCC: PROTEIN: 30 mg/dL

## 2015-05-06 LAB — COMPREHENSIVE METABOLIC PANEL (CC13)
ALT: 39 U/L (ref 0–55)
AST: 29 U/L (ref 5–34)
Albumin: 3.3 g/dL — ABNORMAL LOW (ref 3.5–5.0)
Alkaline Phosphatase: 43 U/L (ref 40–150)
Anion Gap: 9 mEq/L (ref 3–11)
BUN: 13.7 mg/dL (ref 7.0–26.0)
CALCIUM: 9.3 mg/dL (ref 8.4–10.4)
CHLORIDE: 106 meq/L (ref 98–109)
CO2: 26 mEq/L (ref 22–29)
Creatinine: 1.2 mg/dL (ref 0.7–1.3)
EGFR: 69 mL/min/{1.73_m2} — ABNORMAL LOW (ref >90)
GLUCOSE: 137 mg/dL (ref 70–140)
POTASSIUM: 4.1 meq/L (ref 3.5–5.1)
SODIUM: 141 meq/L (ref 136–145)
Total Bilirubin: 0.54 mg/dL (ref 0.20–1.20)
Total Protein: 6.9 g/dL (ref 6.4–8.3)

## 2015-05-06 MED ORDER — SODIUM CHLORIDE 0.9 % IV SOLN
1600.0000 mg/m2 | INTRAVENOUS | Status: DC
  Administered 2015-05-06: 3750 mg via INTRAVENOUS
  Filled 2015-05-06: qty 75

## 2015-05-06 MED ORDER — LEUCOVORIN CALCIUM INJECTION 350 MG
240.0000 mg/m2 | Freq: Once | INTRAVENOUS | Status: AC
  Administered 2015-05-06: 562 mg via INTRAVENOUS
  Filled 2015-05-06: qty 28.1

## 2015-05-06 MED ORDER — FLUOROURACIL CHEMO INJECTION 2.5 GM/50ML
240.0000 mg/m2 | Freq: Once | INTRAVENOUS | Status: AC
  Administered 2015-05-06: 550 mg via INTRAVENOUS
  Filled 2015-05-06: qty 11

## 2015-05-06 MED ORDER — ALTEPLASE 2 MG IJ SOLR
2.0000 mg | Freq: Once | INTRAMUSCULAR | Status: AC | PRN
  Administered 2015-05-06: 2 mg
  Filled 2015-05-06: qty 2

## 2015-05-06 MED ORDER — ATROPINE SULFATE 1 MG/ML IJ SOLN
0.5000 mg | Freq: Once | INTRAMUSCULAR | Status: AC | PRN
  Administered 2015-05-06: 1 mg via INTRAVENOUS

## 2015-05-06 MED ORDER — PALONOSETRON HCL INJECTION 0.25 MG/5ML
0.2500 mg | Freq: Once | INTRAVENOUS | Status: AC
  Administered 2015-05-06: 0.25 mg via INTRAVENOUS

## 2015-05-06 MED ORDER — IRINOTECAN HCL CHEMO INJECTION 100 MG/5ML
180.0000 mg/m2 | Freq: Once | INTRAVENOUS | Status: AC
  Administered 2015-05-06: 422 mg via INTRAVENOUS
  Filled 2015-05-06: qty 21.1

## 2015-05-06 MED ORDER — SODIUM CHLORIDE 0.9 % IV SOLN
Freq: Once | INTRAVENOUS | Status: AC
  Administered 2015-05-06: 11:00:00 via INTRAVENOUS

## 2015-05-06 MED ORDER — SODIUM CHLORIDE 0.9 % IV SOLN
Freq: Once | INTRAVENOUS | Status: AC
  Administered 2015-05-06: 14:00:00 via INTRAVENOUS
  Filled 2015-05-06: qty 5

## 2015-05-06 MED ORDER — PALONOSETRON HCL INJECTION 0.25 MG/5ML
INTRAVENOUS | Status: AC
  Filled 2015-05-06: qty 5

## 2015-05-06 MED ORDER — ATROPINE SULFATE 1 MG/ML IJ SOLN
INTRAMUSCULAR | Status: AC
  Filled 2015-05-06: qty 1

## 2015-05-06 NOTE — Assessment & Plan Note (Signed)
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

## 2015-05-06 NOTE — Progress Notes (Signed)
Post 2 hour [place] of cathflo blood return obtained.  Post infusion of ns - obtained 10cc of blood return.

## 2015-05-06 NOTE — Telephone Encounter (Signed)
per pof to sch pt appt-gave pt copy of avs-gave pt contrast-Sent MW emailt o sch trmt on 1/9-pt tp get updated sch b4 leaving trmt room

## 2015-05-06 NOTE — Progress Notes (Signed)
[id number]: Follow up M3 I met with patient and spouse this morning during his appointment with NP Selena Lesser. Patient here for his scheduled lab, office visit and treatment. Resting vitals obtained prior to patient's office visit. I inquired with patient of any new anticancer medications that he may be taking, patient reports no new medications. Patient reports to an increase in energy since going off study and having treatment every three weeks vs every two weeks, but does confirm that his energy level drops again toward the end of the week after treatment, but recovers and is able to perform his daily activities. Patient also reports continuation of abdominal pain that starts the day of when he receives chemotherapy and then subsides 3-5 days after treatment ends. Patient rates abdominal pain between a 2-3 on a scale of 10. Patient reports continued bleeding of gums, but confirms that he has no gum pain since going to an every 3 week treatment schedule, he confirms bleeding does not interfere with his eating. Last Avastin treatment 09/19 and MD continues to hold Avastin treatment for now.  Patient denies: nausea, vomiting, bowel or bladder concerns. Patient confirms no difficulty with appetite. Patient denies any other concerns or complaints. All patient's questions answered to his satisfaction, denies further questions at this time. I thanked patient and spouse for their time and continued support of study and encouraged them to call me or Dr. Benay Spice should they have any questions related to study. Adele Dan, RN, BSN Clinical Research 05/06/2015 9:30 AM

## 2015-05-06 NOTE — Assessment & Plan Note (Addendum)
Patient presented to the [facility] today to receive cycle 4 of his Folfiri chemotherapy regimen.  He reports slight increased fatigue; but improvement in his neuropathy to his extremities.  Within the past few weeks.  He states that his gums continue to bleed; but there is no further pain or discomfort to his gums.  Central, upper abdominal mass measures approximately 6.5 cm in diameter and 6 cm in length.  He denies any nausea, vomiting, diarrhea, or constipation.  Denies any recent fevers or chills.  Blood counts obtained today revealed a WBC of 5.9, ANC 2.9, helical of [id number], and platelet count 168.  Patient has recently been switched from every two-week chemotherapy to every three-week cycle; to allow for further recovery in between cycles.  Also, has been holding the Avastin and the Neulasta as well.  It does appear the patient's white count recovered well enough without the Neulasta growth factor support.  The plan is for the patient to undergo a re-staging CT scan with contrast of the chest/abdomen/pelvis on 05/20/2015.  Patient will need to be scheduled for labs and a follow-up visit on 05/23/2015.  He will undergo cycle 5 of his chemotherapy on 05/27/2015.  Patient and his wife are both aware of these scheduling plans.  They plan to pick up the contrast for the restaging scan today.
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
## 2015-05-06 NOTE — Assessment & Plan Note (Signed)
Patient reports slight increased fatigue since his last cycle of chemotherapy; despite changing his chemotherapy from every 2 weeks to every 3 weeks.  He continues with a good appetite and denies any nausea, vomiting, diarrhea, or constipation.  He remained able to perform his daily activities with no difficulty, however.  ECOG performance status would be considered a "0".    Each was encouraged to continue pushing fluids and to remain as active as possible.

## 2015-05-06 NOTE — Assessment & Plan Note (Signed)
Patient has been experiencing some chronic bleeding and hyperplasia of the gums; with some gum/dental pain as well.  Patient had a tooth extracted in that area; and the pain has completely subsided.  However, patient continues to complain of frequent bleeding of his gums.  He states that his gums bleed even while he is sleeping.  He denies any new oral lesions whatsoever.  Exam reveals some mild hyperplasia to his gums; with increased hyperplasia and some slight irritation to the right upper gums.  Avastin was held with her last cycle of chemotherapy to see if this would help improve with gum healing.  We'll continue to hold the Avastin for the time being.  Patient was encouraged to use baking soda/salt swish and spit to promote healing.  Will continue to monitor closely.

## 2015-05-08 ENCOUNTER — Ambulatory Visit: Payer: 59

## 2015-05-08 ENCOUNTER — Ambulatory Visit (HOSPITAL_BASED_OUTPATIENT_CLINIC_OR_DEPARTMENT_OTHER): Payer: 59

## 2015-05-08 VITALS — BP 146/83 | HR 82 | Temp 98.3°F | Resp 18

## 2015-05-08 DIAGNOSIS — C189 Malignant neoplasm of colon, unspecified: Secondary | ICD-10-CM | POA: Diagnosis not present

## 2015-05-08 MED ORDER — HEPARIN SOD (PORK) LOCK FLUSH 100 UNIT/ML IV SOLN
250.0000 [IU] | Freq: Once | INTRAVENOUS | Status: DC | PRN
  Filled 2015-05-08: qty 5

## 2015-05-08 MED ORDER — HEPARIN SOD (PORK) LOCK FLUSH 100 UNIT/ML IV SOLN
500.0000 [IU] | Freq: Once | INTRAVENOUS | Status: AC | PRN
  Administered 2015-05-08: 500 [IU]
  Filled 2015-05-08: qty 5

## 2015-05-08 NOTE — Patient Instructions (Signed)
Implanted Port Insertion, Care After °Refer to this sheet in the next few weeks. These instructions provide you with information on caring for yourself after your procedure. Your health care provider may also give you more specific instructions. Your treatment has been planned according to current medical practices, but problems sometimes occur. Call your health care provider if you have any problems or questions after your procedure. °WHAT TO EXPECT AFTER THE PROCEDURE °After your procedure, it is typical to have the following:  °· Discomfort at the port insertion site. Ice packs to the area will help. °· Bruising on the skin over the port. This will subside in 3-4 days. °HOME CARE INSTRUCTIONS °· After your port is placed, you will get a manufacturer's information card. The card has information about your port. Keep this card with you at all times.   °· Know what kind of port you have. There are many types of ports available.   °· Wear a medical alert bracelet in case of an emergency. This can help alert health care workers that you have a port.   °· The port can stay in for as long as your health care provider believes it is necessary.   °· A home health care nurse may give medicines and take care of the port.   °· You or a family member can get special training and directions for giving medicine and taking care of the port at home.   °SEEK MEDICAL CARE IF:  °· Your port does not flush or you are unable to get a blood return.   °· You have a fever or chills. °SEEK IMMEDIATE MEDICAL CARE IF: °· You have new fluid or pus coming from your incision.   °· You notice a bad smell coming from your incision site.   °· You have swelling, pain, or more redness at the incision or port site.   °· You have chest pain or shortness of breath. °  °This information is not intended to replace advice given to you by your health care provider. Make sure you discuss any questions you have with your health care provider. °  °Document  Released: 03/15/2013 Document Revised: 05/30/2013 Document Reviewed: 03/15/2013 °Elsevier Interactive Patient Education ©2016 [employer] ° °

## 2015-05-20 ENCOUNTER — Ambulatory Visit (HOSPITAL_COMMUNITY)
Admission: RE | Admit: 2015-05-20 | Discharge: 2015-05-20 | Disposition: A | Discharge status: Home / Self Care | Payer: 59 | Source: Ambulatory Visit | Attending: Nurse Practitioner | Admitting: Nurse Practitioner

## 2015-05-20 ENCOUNTER — Other Ambulatory Visit: Payer: Self-pay | Admitting: Nurse Practitioner

## 2015-05-20 ENCOUNTER — Encounter (HOSPITAL_COMMUNITY): Payer: Self-pay

## 2015-05-20 DIAGNOSIS — C7989 Secondary malignant neoplasm of other specified sites: Secondary | ICD-10-CM | POA: Insufficient documentation

## 2015-05-20 DIAGNOSIS — C189 Malignant neoplasm of colon, unspecified: Secondary | ICD-10-CM

## 2015-05-20 DIAGNOSIS — R918 Other nonspecific abnormal finding of lung field: Secondary | ICD-10-CM | POA: Insufficient documentation

## 2015-05-20 DIAGNOSIS — Z9049 Acquired absence of other specified parts of digestive tract: Secondary | ICD-10-CM | POA: Insufficient documentation

## 2015-05-20 DIAGNOSIS — K669 Disorder of peritoneum, unspecified: Secondary | ICD-10-CM | POA: Diagnosis not present

## 2015-05-20 MED ORDER — IOHEXOL 300 MG/ML  SOLN
100.0000 mL | Freq: Once | INTRAMUSCULAR | Status: AC | PRN
  Administered 2015-05-20: 100 mL via INTRAVENOUS

## 2015-05-22 ENCOUNTER — Other Ambulatory Visit: Payer: Self-pay | Admitting: Oncology

## 2015-05-22 ENCOUNTER — Other Ambulatory Visit: Payer: Self-pay | Admitting: Nurse Practitioner

## 2015-05-23 ENCOUNTER — Ambulatory Visit (HOSPITAL_BASED_OUTPATIENT_CLINIC_OR_DEPARTMENT_OTHER): Payer: 59 | Admitting: Nurse Practitioner

## 2015-05-23 ENCOUNTER — Other Ambulatory Visit: Payer: Self-pay | Admitting: Oncology

## 2015-05-23 ENCOUNTER — Telehealth: Payer: Self-pay | Admitting: Oncology

## 2015-05-23 ENCOUNTER — Encounter: Payer: Self-pay | Admitting: Nurse Practitioner

## 2015-05-23 ENCOUNTER — Other Ambulatory Visit (HOSPITAL_BASED_OUTPATIENT_CLINIC_OR_DEPARTMENT_OTHER): Payer: 59

## 2015-05-23 VITALS — BP 139/83 | HR 63 | Temp 98.0°F | Resp 18 | Ht 71.0 in | Wt 252.8 lb

## 2015-05-23 DIAGNOSIS — K068 Other specified disorders of gingiva and edentulous alveolar ridge: Secondary | ICD-10-CM | POA: Diagnosis not present

## 2015-05-23 DIAGNOSIS — L609 Nail disorder, unspecified: Secondary | ICD-10-CM

## 2015-05-23 DIAGNOSIS — C182 Malignant neoplasm of ascending colon: Secondary | ICD-10-CM

## 2015-05-23 DIAGNOSIS — G622 Polyneuropathy due to other toxic agents: Secondary | ICD-10-CM

## 2015-05-23 DIAGNOSIS — C189 Malignant neoplasm of colon, unspecified: Secondary | ICD-10-CM

## 2015-05-23 DIAGNOSIS — T451X5A Adverse effect of antineoplastic and immunosuppressive drugs, initial encounter: Secondary | ICD-10-CM

## 2015-05-23 DIAGNOSIS — Q846 Other congenital malformations of nails: Secondary | ICD-10-CM | POA: Diagnosis not present

## 2015-05-23 DIAGNOSIS — G620 Drug-induced polyneuropathy: Secondary | ICD-10-CM

## 2015-05-23 LAB — CBC WITH DIFFERENTIAL/PLATELET
BASO%: 0.7 % (ref 0.0–2.0)
BASOS ABS: 0 10*3/uL (ref 0.0–0.1)
EOS ABS: 0.1 10*3/uL (ref 0.0–0.5)
EOS%: 3.2 % (ref 0.0–7.0)
HCT: 41.4 % (ref 38.4–49.9)
HGB: 13.8 g/dL (ref 13.0–17.1)
LYMPH%: 42 % (ref 14.0–49.0)
MCH: 33.8 pg — AB (ref 27.2–33.4)
MCHC: 33.2 g/dL (ref 32.0–36.0)
MCV: 101.8 fL — AB (ref 79.3–98.0)
MONO#: 0.7 10*3/uL (ref 0.1–0.9)
MONO%: 16.8 % — ABNORMAL HIGH (ref 0.0–14.0)
NEUT%: 37.3 % — ABNORMAL LOW (ref 39.0–75.0)
NEUTROS ABS: 1.6 10*3/uL (ref 1.5–6.5)
PLATELETS: 192 10*3/uL (ref 140–400)
RBC: 4.07 10*6/uL — AB (ref 4.20–5.82)
RDW: 14.7 % — ABNORMAL HIGH (ref 11.0–14.6)
WBC: 4.4 10*3/uL (ref 4.0–10.3)
lymph#: 1.9 10*3/uL (ref 0.9–3.3)

## 2015-05-23 LAB — COMPREHENSIVE METABOLIC PANEL
ALBUMIN: 3.3 g/dL — AB (ref 3.5–5.0)
ALK PHOS: 43 U/L (ref 40–150)
ALT: 32 U/L (ref 0–55)
ANION GAP: 7 meq/L (ref 3–11)
AST: 22 U/L (ref 5–34)
BILIRUBIN TOTAL: 0.44 mg/dL (ref 0.20–1.20)
BUN: 14.3 mg/dL (ref 7.0–26.0)
CO2: 27 meq/L (ref 22–29)
Calcium: 9.3 mg/dL (ref 8.4–10.4)
Chloride: 108 mEq/L (ref 98–109)
Creatinine: 1.1 mg/dL (ref 0.7–1.3)
EGFR: 73 mL/min/{1.73_m2} — AB (ref >90)
Glucose: 98 mg/dl (ref 70–140)
Potassium: 4.3 mEq/L (ref 3.5–5.1)
Sodium: 142 mEq/L (ref 136–145)
TOTAL PROTEIN: 6.5 g/dL (ref 6.4–8.3)

## 2015-05-23 NOTE — Assessment & Plan Note (Signed)
Patient states that his chemotherapy therapy-induced neuropathy to all of his extremities is stable at present.  We'll continue to monitor closely.

## 2015-05-23 NOTE — Assessment & Plan Note (Signed)
Patient states that his nails have become thin and or cracking in different areas.  On exam, appears the patient does have some breaking of his nails; but no issues with cuticles or infection.  Patient was advised that these nail changes are most likely secondary to his chemotherapy.  He should avoid extremes in temperature and keep his hands well moisturized.  Also, he should keep his nails trimmed.

## 2015-05-23 NOTE — Assessment & Plan Note (Signed)
Patient has been suffering with some chronic hyperplasia of his gums; as well as some intermittent bleeding.  Has been holding the Avastin portion of patient's chemotherapy regimen to allow for recovery.  Patient states that his gum discomfort has greatly improved; but he still experiences episodes of mild bleeding of his gums.  Patient is requesting permission to go to his dentist on 06/04/2015 for a light cleaning.  Patient was given permission to see his dentist.  Will plan on holding the Avastin until patient has completed his dentist visit.

## 2015-05-23 NOTE — Telephone Encounter (Signed)
Gave and printed appt sched and avs for pt; for DEC and Jan  °
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
## 2015-05-23 NOTE — Assessment & Plan Note (Addendum)
Patient received cycle 4 of his Folfiri chemotherapy on 05/06/2015.  Have been holding the Avastin due to issues with gum hyperplasia and bleeding.  Patient presents today for follow-up prior to receiving cycle 5 of the same regimen.  He is happy to report that he has been feeling very well recently.  He states that he still has episodes of gum bleeding; but it is much improved.  He is requesting permission to go to his dentist for a light cleaning on 06/04/2015.  He also notes some changes in his vision; stating that he is requiring a stronger reading glasses than before.  He plans to have his eyes checked at his ophthalmologist soon.  Advised patient that some mild vision changes are most likely secondary to his chemotherapy as well.  Patient denies any other neuro changes whatsoever.  He also reports that his neuropathy is essentially stable.  Blood counts obtained today revealed a WBC of 4.4, ANC 1.6, hemoglobin 13.8, platelet count 192.  Restaging CT obtained this week revealed:   IMPRESSION: Chest Impression:  1. No evidence of thoracic metastasis. 2. Stable small RIGHT pulmonary nodules.  Abdomen / Pelvis Impression:  1. Interval increase in size of the subcutaneous metastasis in the epigastric region. 2. Ventral peritoneal nodular implants are similar in size. 3. Porta hepatis lymph node and a small fluid collection along the hepatic surgical margin are similar. 4. No evidence of focal parenchymal hepatic metastasis. 5. Stable RIGHT hemicolectomy anatomy.  Patient's ECOG performance status would be rated a 0 today.  Patient will proceed with cycle 5 of his chemotherapy on Monday, 05/27/2015.  Will continue to hold the Avastin until patient has completed his dental visit.  Also, will continue holding his Neulasta injections; since his white count does seem to recover fairly well with no growth factor support.   Central upper abdominal mass measures approximately 7.0 cm in  diameter and 6 cm in length.-Slightly larger by 0.5 cm.  Patient will return on 05/29/2015 for pump discontinuation.  Patient is already scheduled for labs, visit, and chemotherapy on 06/17/2015.  Patient requested to go ahead and schedule labs, visit, and chemotherapy for 07/08/2015 as well.

## 2015-05-24 ENCOUNTER — Telehealth: Payer: Self-pay | Admitting: *Deleted

## 2015-05-24 NOTE — Telephone Encounter (Signed)
-----   Message from Ladell Pier, MD sent at 05/23/2015  7:32 PM EST ----- Please call patient, anc borderline, plan for neulasta this cycle, unless he has significant bone pain with neulasta- then we can hold neulasta and check cbc 12 days

## 2015-05-24 NOTE — Telephone Encounter (Signed)
Pt returned call and gave him message re: neulasta, he was [facility] with the plan. He denied having bone pain from neulasta in the past.

## 2015-05-27 ENCOUNTER — Other Ambulatory Visit: Payer: Self-pay | Admitting: *Deleted

## 2015-05-27 ENCOUNTER — Other Ambulatory Visit: Payer: 59

## 2015-05-27 ENCOUNTER — Ambulatory Visit: Payer: 59 | Admitting: Nurse Practitioner

## 2015-05-27 ENCOUNTER — Ambulatory Visit (HOSPITAL_BASED_OUTPATIENT_CLINIC_OR_DEPARTMENT_OTHER): Payer: 59

## 2015-05-27 VITALS — BP 138/85 | HR 65 | Temp 98.3°F | Resp 18

## 2015-05-27 DIAGNOSIS — Z5111 Encounter for antineoplastic chemotherapy: Secondary | ICD-10-CM

## 2015-05-27 DIAGNOSIS — C189 Malignant neoplasm of colon, unspecified: Secondary | ICD-10-CM

## 2015-05-27 MED ORDER — PALONOSETRON HCL INJECTION 0.25 MG/5ML
INTRAVENOUS | Status: AC
  Filled 2015-05-27: qty 5

## 2015-05-27 MED ORDER — IRINOTECAN HCL CHEMO INJECTION 100 MG/5ML
180.0000 mg/m2 | Freq: Once | INTRAVENOUS | Status: AC
  Administered 2015-05-27: 422 mg via INTRAVENOUS
  Filled 2015-05-27: qty 21.1

## 2015-05-27 MED ORDER — FLUOROURACIL CHEMO INJECTION 2.5 GM/50ML
240.0000 mg/m2 | Freq: Once | INTRAVENOUS | Status: AC
  Administered 2015-05-27: 550 mg via INTRAVENOUS
  Filled 2015-05-27: qty 11

## 2015-05-27 MED ORDER — SODIUM CHLORIDE 0.9 % IV SOLN
1600.0000 mg/m2 | INTRAVENOUS | Status: DC
  Administered 2015-05-27: 3750 mg via INTRAVENOUS
  Filled 2015-05-27: qty 75

## 2015-05-27 MED ORDER — SODIUM CHLORIDE 0.9 % IV SOLN
Freq: Once | INTRAVENOUS | Status: AC
  Administered 2015-05-27: 11:00:00 via INTRAVENOUS

## 2015-05-27 MED ORDER — ALTEPLASE 2 MG IJ SOLR
2.0000 mg | Freq: Once | INTRAMUSCULAR | Status: AC | PRN
  Administered 2015-05-27: 2 mg
  Filled 2015-05-27: qty 2

## 2015-05-27 MED ORDER — ATROPINE SULFATE 1 MG/ML IJ SOLN
0.5000 mg | Freq: Once | INTRAMUSCULAR | Status: AC | PRN
Start: 2015-05-27 — End: 2015-05-27
  Administered 2015-05-27: 0.5 mg via INTRAVENOUS

## 2015-05-27 MED ORDER — ATROPINE SULFATE 1 MG/ML IJ SOLN
INTRAMUSCULAR | Status: AC
  Filled 2015-05-27: qty 1

## 2015-05-27 MED ORDER — LEUCOVORIN CALCIUM INJECTION 350 MG
240.0000 mg/m2 | Freq: Once | INTRAVENOUS | Status: AC
  Administered 2015-05-27: 562 mg via INTRAVENOUS
  Filled 2015-05-27: qty 28.1

## 2015-05-27 MED ORDER — PALONOSETRON HCL INJECTION 0.25 MG/5ML
0.2500 mg | Freq: Once | INTRAVENOUS | Status: AC
  Administered 2015-05-27: 0.25 mg via INTRAVENOUS

## 2015-05-27 MED ORDER — SODIUM CHLORIDE 0.9 % IV SOLN
Freq: Once | INTRAVENOUS | Status: AC
  Administered 2015-05-27: 12:00:00 via INTRAVENOUS
  Filled 2015-05-27: qty 5

## 2015-05-27 NOTE — Progress Notes (Signed)
Patient states he will receive neulasta injection with pump d/c on day 3. No injection ordered and no appointment at this time. ANC 1.6 with start of this cycle. Conveyed to Amy, Rn to Dr. Benay Spice. To follow up.

## 2015-05-27 NOTE — Patient Instructions (Signed)
[facility] Discharge Instructions for Patients Receiving Chemotherapy  Today you received the following chemotherapy agents: Irinotecan, Leucovorin, 5FU   To help prevent nausea and vomiting after your treatment, we encourage you to take your nausea medication as prescribed.    If you develop nausea and vomiting that is not controlled by your nausea medication, call the clinic.   BELOW ARE SYMPTOMS THAT SHOULD BE REPORTED IMMEDIATELY:  *FEVER GREATER THAN 100.5 F  *CHILLS WITH OR WITHOUT FEVER  NAUSEA AND VOMITING THAT IS NOT CONTROLLED WITH YOUR NAUSEA MEDICATION  *UNUSUAL SHORTNESS OF BREATH  *UNUSUAL BRUISING OR BLEEDING  TENDERNESS IN MOUTH AND THROAT WITH OR WITHOUT PRESENCE OF ULCERS  *URINARY PROBLEMS  *BOWEL PROBLEMS  UNUSUAL RASH Items with * indicate a potential emergency and should be followed up as soon as possible.  Feel free to call the clinic you have any questions or concerns. The clinic phone number is [phone].  Please show the [place] at check-in to the Emergency Department and triage nurse.

## 2015-05-28 ENCOUNTER — Other Ambulatory Visit: Payer: Self-pay | Admitting: Oncology

## 2015-05-29 ENCOUNTER — Ambulatory Visit (HOSPITAL_BASED_OUTPATIENT_CLINIC_OR_DEPARTMENT_OTHER): Payer: 59

## 2015-05-29 ENCOUNTER — Ambulatory Visit: Payer: 59

## 2015-05-29 VITALS — BP 150/76 | HR 82 | Temp 98.3°F | Resp 12

## 2015-05-29 DIAGNOSIS — Z5189 Encounter for other specified aftercare: Secondary | ICD-10-CM | POA: Diagnosis not present

## 2015-05-29 DIAGNOSIS — C182 Malignant neoplasm of ascending colon: Secondary | ICD-10-CM | POA: Diagnosis not present

## 2015-05-29 DIAGNOSIS — C189 Malignant neoplasm of colon, unspecified: Secondary | ICD-10-CM

## 2015-05-29 MED ORDER — HEPARIN SOD (PORK) LOCK FLUSH 100 UNIT/ML IV SOLN
500.0000 [IU] | Freq: Once | INTRAVENOUS | Status: AC | PRN
  Administered 2015-05-29: 500 [IU]
  Filled 2015-05-29: qty 5

## 2015-05-29 MED ORDER — SODIUM CHLORIDE 0.9 % IJ SOLN
10.0000 mL | INTRAMUSCULAR | Status: DC | PRN
  Administered 2015-05-29: 10 mL
  Filled 2015-05-29: qty 10

## 2015-05-29 MED ORDER — PEGFILGRASTIM INJECTION 6 MG/0.6ML ~~LOC~~
6.0000 mg | PREFILLED_SYRINGE | Freq: Once | SUBCUTANEOUS | Status: AC
  Administered 2015-05-29: 6 mg via SUBCUTANEOUS
  Filled 2015-05-29: qty 0.6

## 2015-05-29 NOTE — Progress Notes (Signed)
Injection given by flush nurse 

## 2015-06-10 ENCOUNTER — Other Ambulatory Visit: Payer: Self-pay | Admitting: Oncology

## 2015-06-14 ENCOUNTER — Other Ambulatory Visit: Payer: Self-pay | Admitting: *Deleted

## 2015-06-14 DIAGNOSIS — C182 Malignant neoplasm of ascending colon: Secondary | ICD-10-CM | POA: Insufficient documentation

## 2015-06-17 ENCOUNTER — Encounter: Payer: Self-pay | Admitting: Nurse Practitioner

## 2015-06-17 ENCOUNTER — Other Ambulatory Visit (HOSPITAL_BASED_OUTPATIENT_CLINIC_OR_DEPARTMENT_OTHER): Payer: 59

## 2015-06-17 ENCOUNTER — Ambulatory Visit (HOSPITAL_BASED_OUTPATIENT_CLINIC_OR_DEPARTMENT_OTHER): Payer: 59 | Admitting: Nurse Practitioner

## 2015-06-17 ENCOUNTER — Ambulatory Visit (HOSPITAL_BASED_OUTPATIENT_CLINIC_OR_DEPARTMENT_OTHER): Payer: 59

## 2015-06-17 VITALS — BP 109/76 | HR 64

## 2015-06-17 VITALS — BP 133/74 | HR 71 | Temp 98.6°F | Resp 16 | Ht 71.0 in | Wt 258.0 lb

## 2015-06-17 DIAGNOSIS — Z5111 Encounter for antineoplastic chemotherapy: Secondary | ICD-10-CM | POA: Diagnosis not present

## 2015-06-17 DIAGNOSIS — C189 Malignant neoplasm of colon, unspecified: Secondary | ICD-10-CM | POA: Diagnosis not present

## 2015-06-17 DIAGNOSIS — Z5112 Encounter for antineoplastic immunotherapy: Secondary | ICD-10-CM | POA: Diagnosis not present

## 2015-06-17 DIAGNOSIS — C183 Malignant neoplasm of hepatic flexure: Secondary | ICD-10-CM

## 2015-06-17 DIAGNOSIS — C787 Secondary malignant neoplasm of liver and intrahepatic bile duct: Secondary | ICD-10-CM | POA: Diagnosis not present

## 2015-06-17 DIAGNOSIS — G620 Drug-induced polyneuropathy: Secondary | ICD-10-CM | POA: Diagnosis not present

## 2015-06-17 DIAGNOSIS — K068 Other specified disorders of gingiva and edentulous alveolar ridge: Secondary | ICD-10-CM

## 2015-06-17 DIAGNOSIS — T451X5A Adverse effect of antineoplastic and immunosuppressive drugs, initial encounter: Secondary | ICD-10-CM

## 2015-06-17 DIAGNOSIS — R1900 Intra-abdominal and pelvic swelling, mass and lump, unspecified site: Secondary | ICD-10-CM

## 2015-06-17 DIAGNOSIS — C182 Malignant neoplasm of ascending colon: Secondary | ICD-10-CM

## 2015-06-17 LAB — COMPREHENSIVE METABOLIC PANEL
ALT: 31 U/L (ref 0–55)
ANION GAP: 9 meq/L (ref 3–11)
AST: 22 U/L (ref 5–34)
Albumin: 3.7 g/dL (ref 3.5–5.0)
Alkaline Phosphatase: 49 U/L (ref 40–150)
BILIRUBIN TOTAL: 0.48 mg/dL (ref 0.20–1.20)
BUN: 19.2 mg/dL (ref 7.0–26.0)
CHLORIDE: 107 meq/L (ref 98–109)
CO2: 27 meq/L (ref 22–29)
CREATININE: 1.3 mg/dL (ref 0.7–1.3)
Calcium: 9.5 mg/dL (ref 8.4–10.4)
EGFR: 59 mL/min/{1.73_m2} — ABNORMAL LOW (ref >90)
GLUCOSE: 114 mg/dL (ref 70–140)
Potassium: 4.6 mEq/L (ref 3.5–5.1)
Sodium: 142 mEq/L (ref 136–145)
TOTAL PROTEIN: 6.8 g/dL (ref 6.4–8.3)

## 2015-06-17 LAB — CBC WITH DIFFERENTIAL/PLATELET
BASO%: 1.2 % (ref 0.0–2.0)
Basophils Absolute: 0.1 10*3/uL (ref 0.0–0.1)
EOS%: 2.4 % (ref 0.0–7.0)
Eosinophils Absolute: 0.2 10*3/uL (ref 0.0–0.5)
HCT: 41.9 % (ref 38.4–49.9)
HGB: 13.9 g/dL (ref 13.0–17.1)
LYMPH#: 2 10*3/uL (ref 0.9–3.3)
LYMPH%: 25.7 % (ref 14.0–49.0)
MCH: 33.9 pg — ABNORMAL HIGH (ref 27.2–33.4)
MCHC: 33.2 g/dL (ref 32.0–36.0)
MCV: 102.3 fL — ABNORMAL HIGH (ref 79.3–98.0)
MONO#: 0.7 10*3/uL (ref 0.1–0.9)
MONO%: 8.9 % (ref 0.0–14.0)
NEUT%: 61.8 % (ref 39.0–75.0)
NEUTROS ABS: 4.7 10*3/uL (ref 1.5–6.5)
PLATELETS: 188 10*3/uL (ref 140–400)
RBC: 4.1 10*6/uL — AB (ref 4.20–5.82)
RDW: 15.5 % — ABNORMAL HIGH (ref 11.0–14.6)
WBC: 7.6 10*3/uL (ref 4.0–10.3)

## 2015-06-17 MED ORDER — IRINOTECAN HCL CHEMO INJECTION 100 MG/5ML
180.0000 mg/m2 | Freq: Once | INTRAVENOUS | Status: AC
  Administered 2015-06-17: 422 mg via INTRAVENOUS
  Filled 2015-06-17: qty 21.1

## 2015-06-17 MED ORDER — PALONOSETRON HCL INJECTION 0.25 MG/5ML
0.2500 mg | Freq: Once | INTRAVENOUS | Status: AC
  Administered 2015-06-17: 0.25 mg via INTRAVENOUS

## 2015-06-17 MED ORDER — ATROPINE SULFATE 1 MG/ML IJ SOLN
INTRAMUSCULAR | Status: AC
  Filled 2015-06-17: qty 1

## 2015-06-17 MED ORDER — PALONOSETRON HCL INJECTION 0.25 MG/5ML
INTRAVENOUS | Status: AC
  Filled 2015-06-17: qty 5

## 2015-06-17 MED ORDER — SODIUM CHLORIDE 0.9 % IV SOLN
7.3000 mg/kg | Freq: Once | INTRAVENOUS | Status: AC
  Administered 2015-06-17: 800 mg via INTRAVENOUS
  Filled 2015-06-17: qty 32

## 2015-06-17 MED ORDER — FLUOROURACIL CHEMO INJECTION 2.5 GM/50ML
240.0000 mg/m2 | Freq: Once | INTRAVENOUS | Status: AC
  Administered 2015-06-17: 550 mg via INTRAVENOUS
  Filled 2015-06-17: qty 11

## 2015-06-17 MED ORDER — ATROPINE SULFATE 1 MG/ML IJ SOLN
0.5000 mg | Freq: Once | INTRAMUSCULAR | Status: AC | PRN
  Administered 2015-06-17: 0.5 mg via INTRAVENOUS

## 2015-06-17 MED ORDER — SODIUM CHLORIDE 0.9 % IV SOLN
Freq: Once | INTRAVENOUS | Status: AC
  Administered 2015-06-17: 10:00:00 via INTRAVENOUS

## 2015-06-17 MED ORDER — SODIUM CHLORIDE 0.9 % IJ SOLN
10.0000 mL | INTRAMUSCULAR | Status: DC | PRN
  Filled 2015-06-17: qty 10

## 2015-06-17 MED ORDER — HEPARIN SOD (PORK) LOCK FLUSH 100 UNIT/ML IV SOLN
500.0000 [IU] | Freq: Once | INTRAVENOUS | Status: DC | PRN
  Filled 2015-06-17: qty 5

## 2015-06-17 MED ORDER — SODIUM CHLORIDE 0.9 % IV SOLN
1600.0000 mg/m2 | INTRAVENOUS | Status: DC
  Administered 2015-06-17: 3750 mg via INTRAVENOUS
  Filled 2015-06-17: qty 75

## 2015-06-17 MED ORDER — LEUCOVORIN CALCIUM INJECTION 100 MG
20.0000 mg/m2 | Freq: Once | INTRAMUSCULAR | Status: AC
  Administered 2015-06-17: 46 mg via INTRAVENOUS
  Filled 2015-06-17: qty 2.3

## 2015-06-17 MED ORDER — SODIUM CHLORIDE 0.9 % IV SOLN
Freq: Once | INTRAVENOUS | Status: AC
  Administered 2015-06-17: 11:00:00 via INTRAVENOUS
  Filled 2015-06-17: qty 5

## 2015-06-17 NOTE — Assessment & Plan Note (Signed)
Patient states that he has slightly increased neuropathy to his lower extremities.  Will continue to monitor closely.

## 2015-06-17 NOTE — Assessment & Plan Note (Addendum)
Patient received cycle 5 of his Folfiri chemotherapy on 05/23/2015.  Have been holding the Avastin due to issues with gum hyperplasia and bleeding.  Patient presents today for follow-up prior to receiving cycle 6 of the same regimen.  He is happy to report that he has been feeling very well recently.  Patient feels that switching from chemotherapy every 2 weeks to every 3 weeks has greatly improved how he feels.  Patient states that he continues with gum hyperplasia; with main symptoms being chronic oozing of blood from his gums and some lower jaw dental pain when he bites down.  Patient states that he just went for a light cleaning and exam per his dentist on 06/04/2015.  Patient confirmed that the gum bleed, and tenderness have remained; despite holding the Avastin since 04/15/2015.  Central upper abdominal mass measures approximately 7.0 cm in diameter and 6 cm in length.-Slightly larger by 0.5 cm  Blood counts obtained today were all within normal limits.  Reviewed all findings with Dr. Benay Spice; and patient will proceed today with cycle 6 of his chemotherapy as planned.  Will also restart the Avastin therapy today as well; since holding the Avastin apparently made no difference regarding his gums/teeth.  Patient's ECOG performance scale would be considered 0 today.  Patient will return on 06/19/2015 for pump discontinuation in Neulasta injection.  Patient will return on 07/08/2015 for labs, visit, and chemotherapy.  He will return for pump discontinuation on 07/10/2015.
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
## 2015-06-17 NOTE — Patient Instructions (Addendum)
[facility] Discharge Instructions for Patients Receiving Chemotherapy  Today you received the following chemotherapy agents Avastin, Irinotecan, Leucovorin, Fluorouracil. To help prevent nausea and vomiting after your treatment, we encourage you to take your nausea medication as directed. If you develop nausea and vomiting that is not controlled by your nausea medication, call the clinic.   BELOW ARE SYMPTOMS THAT SHOULD BE REPORTED IMMEDIATELY:  *FEVER GREATER THAN 100.5 F  *CHILLS WITH OR WITHOUT FEVER  NAUSEA AND VOMITING THAT IS NOT CONTROLLED WITH YOUR NAUSEA MEDICATION  *UNUSUAL SHORTNESS OF BREATH  *UNUSUAL BRUISING OR BLEEDING  TENDERNESS IN MOUTH AND THROAT WITH OR WITHOUT PRESENCE OF ULCERS  *URINARY PROBLEMS  *BOWEL PROBLEMS  UNUSUAL RASH Items with * indicate a potential emergency and should be followed up as soon as possible.  Feel free to call the clinic you have any questions or concerns. The clinic phone number is [phone].  Please show the [place] at check-in to the Emergency Department and triage nurse.   Leucovorin injection What is this medicine? LEUCOVORIN (loo koe VOR in) is used to prevent or treat the harmful effects of some medicines. This medicine is used to treat anemia caused by a low amount of folic acid in the body. It is also used with 5-fluorouracil (5-FU) to treat colon cancer. This medicine may be used for other purposes; ask your health care provider or pharmacist if you have questions. What should I tell my health care provider before I take this medicine? They need to know if you have any of these conditions: -anemia from low levels of vitamin B-12 in the blood -an unusual or allergic reaction to leucovorin, folic acid, other medicines, foods, dyes, or preservatives -pregnant or trying to get pregnant -breast-feeding How should I use this medicine? This medicine is for injection into a muscle or into a  vein. It is given by a health care professional in a hospital or clinic setting. Talk to your pediatrician regarding the use of this medicine in children. Special care may be needed. Overdosage: If you think you have taken too much of this medicine contact a poison control center or emergency room at once. NOTE: This medicine is only for you. Do not share this medicine with others. What if I miss a dose? This does not apply. What may interact with this medicine? -capecitabine -fluorouracil -phenobarbital -phenytoin -primidone -trimethoprim-sulfamethoxazole This list may not describe all possible interactions. Give your health care provider a list of all the medicines, herbs, non-prescription drugs, or dietary supplements you use. Also tell them if you smoke, drink alcohol, or use illegal drugs. Some items may interact with your medicine. What should I watch for while using this medicine? Your condition will be monitored carefully while you are receiving this medicine. This medicine may increase the side effects of 5-fluorouracil, 5-FU. Tell your doctor or health care professional if you have diarrhea or mouth sores that do not get better or that get worse. What side effects may I notice from receiving this medicine? Side effects that you should report to your doctor or health care professional as soon as possible: -allergic reactions like skin rash, itching or hives, swelling of the face, lips, or tongue -breathing problems -fever, infection -mouth sores -unusual bleeding or bruising -unusually weak or tired Side effects that usually do not require medical attention (report to your doctor or health care professional if they continue or are bothersome): -constipation or diarrhea -loss of appetite -nausea, vomiting This  list may not describe all possible side effects. Call your doctor for medical advice about side effects. You may report side effects to FDA at 1-800-FDA-1088. Where should  I keep my medicine? This drug is given in a hospital or clinic and will not be stored at home. NOTE: This sheet is a summary. It may not cover all possible information. If you have questions about this medicine, talk to your doctor, pharmacist, or health care provider.    2016, Elsevier/Gold Standard. (2007-11-29 16:50:29)

## 2015-06-17 NOTE — Assessment & Plan Note (Signed)
Patient continues to experience moderate, hyperplasia, chronic gum bleeding, and dental discomfort; despite holding the Avastin since 04/15/2015.  Patient states that he went to his dentist on 06/04/2015 for an exam and light cleaning.  Patient states that he has always suffered with gingivitis chronically.  He states that chemotherapy typically aggravates his gingivitis issues.  Patient also was noted to have a developing cold sore to the left outside lower lip.  Patient states he R Bryan Fields has some cracks that he uses to this lesion; since it is chronic.  Decision was made to proceed with Avastin again today; since, with holding the Avastin has made minimal difference in patients come issues.  Patient states that he was given chlorhexidine mouth rinse per his dentist to use.

## 2015-06-19 ENCOUNTER — Ambulatory Visit: Payer: 59

## 2015-06-19 ENCOUNTER — Ambulatory Visit (HOSPITAL_BASED_OUTPATIENT_CLINIC_OR_DEPARTMENT_OTHER): Payer: 59

## 2015-06-19 VITALS — BP 133/79 | HR 83 | Temp 98.3°F

## 2015-06-19 DIAGNOSIS — Z5189 Encounter for other specified aftercare: Secondary | ICD-10-CM

## 2015-06-19 DIAGNOSIS — C189 Malignant neoplasm of colon, unspecified: Secondary | ICD-10-CM | POA: Diagnosis not present

## 2015-06-19 MED ORDER — HEPARIN SOD (PORK) LOCK FLUSH 100 UNIT/ML IV SOLN
500.0000 [IU] | Freq: Once | INTRAVENOUS | Status: AC | PRN
  Administered 2015-06-19: 500 [IU]
  Filled 2015-06-19: qty 5

## 2015-06-19 MED ORDER — SODIUM CHLORIDE 0.9 % IJ SOLN
10.0000 mL | INTRAMUSCULAR | Status: DC | PRN
  Administered 2015-06-19: 10 mL
  Filled 2015-06-19: qty 10

## 2015-06-19 MED ORDER — PEGFILGRASTIM INJECTION 6 MG/0.6ML ~~LOC~~
6.0000 mg | PREFILLED_SYRINGE | Freq: Once | SUBCUTANEOUS | Status: AC
Start: 2015-06-19 — End: 2015-06-19
  Administered 2015-06-19: 6 mg via SUBCUTANEOUS
  Filled 2015-06-19: qty 0.6

## 2015-06-19 NOTE — Patient Instructions (Signed)

## 2015-06-19 NOTE — Progress Notes (Signed)
Pump d/c done by injection nurse  

## 2015-07-05 ENCOUNTER — Other Ambulatory Visit: Payer: Self-pay | Admitting: *Deleted

## 2015-07-05 DIAGNOSIS — C182 Malignant neoplasm of ascending colon: Secondary | ICD-10-CM

## 2015-07-07 ENCOUNTER — Other Ambulatory Visit: Payer: Self-pay | Admitting: Oncology

## 2015-07-08 ENCOUNTER — Encounter: Payer: Self-pay | Admitting: *Deleted

## 2015-07-08 ENCOUNTER — Ambulatory Visit (HOSPITAL_BASED_OUTPATIENT_CLINIC_OR_DEPARTMENT_OTHER): Payer: 59 | Admitting: Oncology

## 2015-07-08 ENCOUNTER — Ambulatory Visit (HOSPITAL_BASED_OUTPATIENT_CLINIC_OR_DEPARTMENT_OTHER): Payer: 59

## 2015-07-08 ENCOUNTER — Other Ambulatory Visit (HOSPITAL_BASED_OUTPATIENT_CLINIC_OR_DEPARTMENT_OTHER): Payer: 59

## 2015-07-08 ENCOUNTER — Telehealth: Payer: Self-pay | Admitting: Oncology

## 2015-07-08 VITALS — BP 113/73

## 2015-07-08 VITALS — BP 126/67 | HR 71 | Temp 98.2°F | Resp 18 | Ht 71.0 in | Wt 257.9 lb

## 2015-07-08 DIAGNOSIS — C189 Malignant neoplasm of colon, unspecified: Secondary | ICD-10-CM

## 2015-07-08 DIAGNOSIS — Z5111 Encounter for antineoplastic chemotherapy: Secondary | ICD-10-CM

## 2015-07-08 DIAGNOSIS — C183 Malignant neoplasm of hepatic flexure: Secondary | ICD-10-CM

## 2015-07-08 DIAGNOSIS — K068 Other specified disorders of gingiva and edentulous alveolar ridge: Secondary | ICD-10-CM | POA: Diagnosis not present

## 2015-07-08 DIAGNOSIS — C787 Secondary malignant neoplasm of liver and intrahepatic bile duct: Secondary | ICD-10-CM | POA: Diagnosis not present

## 2015-07-08 DIAGNOSIS — Z5112 Encounter for antineoplastic immunotherapy: Secondary | ICD-10-CM | POA: Diagnosis not present

## 2015-07-08 DIAGNOSIS — R5381 Other malaise: Secondary | ICD-10-CM

## 2015-07-08 DIAGNOSIS — R1900 Intra-abdominal and pelvic swelling, mass and lump, unspecified site: Secondary | ICD-10-CM | POA: Diagnosis not present

## 2015-07-08 DIAGNOSIS — C182 Malignant neoplasm of ascending colon: Secondary | ICD-10-CM

## 2015-07-08 LAB — CBC WITH DIFFERENTIAL/PLATELET
BASO%: 0.8 % (ref 0.0–2.0)
BASOS ABS: 0 10*3/uL (ref 0.0–0.1)
EOS%: 2.6 % (ref 0.0–7.0)
Eosinophils Absolute: 0.1 10*3/uL (ref 0.0–0.5)
HEMATOCRIT: 43.1 % (ref 38.4–49.9)
HGB: 14.3 g/dL (ref 13.0–17.1)
LYMPH#: 1.7 10*3/uL (ref 0.9–3.3)
LYMPH%: 28.9 % (ref 14.0–49.0)
MCH: 34.5 pg — AB (ref 27.2–33.4)
MCHC: 33.2 g/dL (ref 32.0–36.0)
MCV: 103.8 fL — AB (ref 79.3–98.0)
MONO#: 0.6 10*3/uL (ref 0.1–0.9)
MONO%: 10.3 % (ref 0.0–14.0)
NEUT#: 3.4 10*3/uL (ref 1.5–6.5)
NEUT%: 57.4 % (ref 39.0–75.0)
PLATELETS: 178 10*3/uL (ref 140–400)
RBC: 4.15 10*6/uL — AB (ref 4.20–5.82)
RDW: 15.7 % — ABNORMAL HIGH (ref 11.0–14.6)
WBC: 5.8 10*3/uL (ref 4.0–10.3)

## 2015-07-08 LAB — COMPREHENSIVE METABOLIC PANEL
ALT: 48 U/L (ref 0–55)
ANION GAP: 9 meq/L (ref 3–11)
AST: 30 U/L (ref 5–34)
Albumin: 3.5 g/dL (ref 3.5–5.0)
Alkaline Phosphatase: 57 U/L (ref 40–150)
BUN: 15.8 mg/dL (ref 7.0–26.0)
CALCIUM: 9.1 mg/dL (ref 8.4–10.4)
CHLORIDE: 107 meq/L (ref 98–109)
CO2: 25 meq/L (ref 22–29)
CREATININE: 1.2 mg/dL (ref 0.7–1.3)
EGFR: 68 mL/min/{1.73_m2} — AB (ref >90)
Glucose: 139 mg/dl (ref 70–140)
POTASSIUM: 4.4 meq/L (ref 3.5–5.1)
Sodium: 141 mEq/L (ref 136–145)
Total Bilirubin: 0.51 mg/dL (ref 0.20–1.20)
Total Protein: 6.5 g/dL (ref 6.4–8.3)

## 2015-07-08 LAB — UA PROTEIN, DIPSTICK - CHCC: PROTEIN: NEGATIVE mg/dL

## 2015-07-08 MED ORDER — ATROPINE SULFATE 1 MG/ML IJ SOLN
INTRAMUSCULAR | Status: AC
  Filled 2015-07-08: qty 1

## 2015-07-08 MED ORDER — IRINOTECAN HCL CHEMO INJECTION 100 MG/5ML
180.0000 mg/m2 | Freq: Once | INTRAVENOUS | Status: AC
  Administered 2015-07-08: 422 mg via INTRAVENOUS
  Filled 2015-07-08: qty 21.1

## 2015-07-08 MED ORDER — SODIUM CHLORIDE 0.9 % IV SOLN
Freq: Once | INTRAVENOUS | Status: AC
  Administered 2015-07-08: 10:00:00 via INTRAVENOUS

## 2015-07-08 MED ORDER — SODIUM CHLORIDE 0.9 % IV SOLN
7.4000 mg/kg | Freq: Once | INTRAVENOUS | Status: AC
  Administered 2015-07-08: 800 mg via INTRAVENOUS
  Filled 2015-07-08: qty 32

## 2015-07-08 MED ORDER — SODIUM CHLORIDE 0.9 % IV SOLN
Freq: Once | INTRAVENOUS | Status: AC
  Administered 2015-07-08: 11:00:00 via INTRAVENOUS
  Filled 2015-07-08: qty 5

## 2015-07-08 MED ORDER — FLUOROURACIL CHEMO INJECTION 2.5 GM/50ML
240.0000 mg/m2 | Freq: Once | INTRAVENOUS | Status: AC
  Administered 2015-07-08: 550 mg via INTRAVENOUS
  Filled 2015-07-08: qty 11

## 2015-07-08 MED ORDER — ATROPINE SULFATE 1 MG/ML IJ SOLN
0.5000 mg | Freq: Once | INTRAMUSCULAR | Status: AC | PRN
  Administered 2015-07-08: 0.5 mg via INTRAVENOUS

## 2015-07-08 MED ORDER — PALONOSETRON HCL INJECTION 0.25 MG/5ML
0.2500 mg | Freq: Once | INTRAVENOUS | Status: AC
  Administered 2015-07-08: 0.25 mg via INTRAVENOUS

## 2015-07-08 MED ORDER — PALONOSETRON HCL INJECTION 0.25 MG/5ML
INTRAVENOUS | Status: AC
  Filled 2015-07-08: qty 5

## 2015-07-08 MED ORDER — SODIUM CHLORIDE 0.9 % IV SOLN
1600.0000 mg/m2 | INTRAVENOUS | Status: DC
  Administered 2015-07-08: 3750 mg via INTRAVENOUS
  Filled 2015-07-08: qty 75

## 2015-07-08 MED ORDER — LEUCOVORIN CALCIUM INJECTION 100 MG
20.0000 mg/m2 | Freq: Once | INTRAMUSCULAR | Status: AC
  Administered 2015-07-08: 46 mg via INTRAVENOUS
  Filled 2015-07-08: qty 2.3

## 2015-07-08 NOTE — Progress Notes (Signed)
[place] OFFICE PROGRESS NOTE   Diagnosis: Colon cancer  INTERVAL HISTORY:   [name] returns as scheduled. He continues every three-week FOLFIRI/Avastin. He has noted increased malaise over the past few months. No nausea/vomiting or diarrhea. Decrease to have pain at the gums. This improves with a mouth rinse. The abdominal mass feels less "tight ".  Objective:  Vital signs in last 24 hours:  Blood pressure 126/67, pulse 71, temperature 98.2 F (36.8 C), temperature source Oral, resp. rate 18, height _0  (1.803 m), weight 257 lb 14.4 oz (116.983 kg), SpO2 98 %.    HEENT: No thrush or ulcers Resp: Lungs clear bilaterally Cardio: Regular rate and rhythm GI: No hepatosplenomegaly, upper abdomen mass measures 6.5 cm in transverse dimension and 6 cm in vertical dimension Vascular: No leg edema  Skin: Palms without erythema   Portacath/PICC-without erythema  Lab Results:  Lab Results  Component Value Date   WBC 5.8 07/08/2015   HGB 14.3 07/08/2015   HCT 43.1 07/08/2015   MCV 103.8* 07/08/2015   PLT 178 07/08/2015   NEUTROABS 3.4 07/08/2015     Medications: I have reviewed the patient's current medications.  Assessment/Plan: 1. 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 Microsatellite stable; preserved expression of major and minor MMR proteins. He began treatment with FOLFOX in December 2011. He completed cycle #12 on 10/27/2010. Oxaliplatin was held with cycles number 7 through 10 and resumed with cycle #11. 2. History of neutropenia secondary to chemotherapy. 3. History of thrombocytopenia secondary chemotherapy. 4. He did not undergo a preoperative colonoscopy. He underwent a colonoscopy on 12/19/2010 with findings of a 1 cm polyp at 90 cm from the anal verge, minimal polyp at 30 cm from the anal verge and minimal diverticulosis.  Colonoscopy 03/18/2012-negative. 5. 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 He began study drug on 05/13/2010. 6. Status post Port-A-Cath placement. The Port-A-Cath has been removed. 7. 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 9. Low attenuation lesion in the caudate lobe of the liver on the CT 04/11/2012-? Cyst. MRI liver on 04/20/2012 favored the caudate lobe liver lesion to represent a cyst or complex cyst. CT abdomen/pelvis 04/17/2013 with continued enlargement of hypovascular lesion in the caudate lobe of the liver.  PET scan 10/27/2011 confirmed a hypermetabolic caudate lobe liver lesion, tiny indeterminate lung nodules, and a 9 mm level II left neck node   CT biopsy of the liver lesion 11/07/2013 confirmed adenocarcinoma  Left liver and caudate resection 01/15/2014-pathology confirmed metastatic colon cancer, and negative surgical margins  Surveillance CT scans 07/24/2014 consistent with recurrent disease involving upper abdominal lymph nodes, peritoneal implants, and an anterior abdominal wall mass  Status post biopsy of the anterior abdominal wall mass 08/06/2014 with pathology showing metastatic adenocarcinoma consistent with a colon primary.  UGT1A1 1/28  Cycle 1 FOLFIRI/Avastin on the [facility] 1317 08/20/2014  Cycle 2 FOLFIRI/Avastin 09/03/2014  Cycle 3 held on 09/17/2014 due to neutropenia  Cycle 3 FOLFIRI/Avastin 09/25/2014. Neulasta added beginning with cycle 3.  Cycle 4 FOLFIRI/Avastin 10/08/2014  Restaging CT scans 10/18/2014 with slight interval increase in the size of the soft tissue mass in the subcutaneous fat of the epigastric region. Underlying peritoneal implants, probable focus of residual disease along the resected margin of the liver and  hepatoduodenal ligament lymphadenopathy all appeared  slightly improved. No new sites of metastatic disease otherwise noted. New left lower lobe bronchopneumonia.  Cycle 5 FOLFIRI/Avastin 10/22/2014  Cycle 6 FOLFIRI/Avastin 11/06/2014  Cycle 7 FOLFIRI Avastin 11/19/2014  Cycle 8 FOLFIRI/Avastin 12/03/2014  CT 12/14/2014 with stable disease  Cycle 9 FOLFIRI/Avastin 12/17/2014  Cycle 10 FOLFIRI/Avastin 01/07/2015 (5-fluorouracil and leucovorin dose reduced)  Cycle 11 FOLFIRI/Avastin 01/21/2015  Cycle 12 FOLFIRI/Avastin 02/04/2015  CT 02/13/2015 with stable disease  Cycle 13 FOLFIRI/Avastin 02/25/2015-changed to an every three-week protocol, now off study  Avastin held with cycle 14 FOLFIRI on 03/25/2015 secondary to gingivitis  Restaging CTs 05/20/2015-slight increase in the size of the epigastric subcutaneous mass, stable peritoneal implants  FOLFIRI continued on a 3 week schedule  Avastin resumed 06/17/2015 10. Iron deposition within the liver noted on MRI 04/20/2012. The ferritin level was normal on 04/17/2013. Increased iron deposition noted on the left liver resection 01/15/2014 11. Pain/bleeding at the anus reported when here 04/24/2013. Status post evaluation by Dr. [name] with findings of an anal fissure. 12. Right face cystic lesion 13. Status post Port-A-Cath placement 08/14/2014 14. Delayed nausea following FOLFIRI, prophylactic Decadron added with cycle 2 with improvement. 15. Chest CT 10/18/2014 with new left lower lobe bronchopneumonia. Levaquin initiated. Resolved on CT 12/14/2014 16. Gum pain- mucositis?, Gingivitis?-Improved with discontinuation of Avastin 17. Pain/tenderness, mild erythema and full appearance over the maxillary sinuses. Maxillofacial CT 01/30/2015 with mild to moderate bilateral maxillary and sphenoid sinus inflammatory changes. Multifocal right maxillary dental periapical lucency. Status post a right upper tooth extraction with resolution of the pain     Disposition:  [name] appears  stable. There is been no worsening of the gum pain/bleeding since resuming Avastin. The plan is to continue FOLFIRI/Avastin. I discussed treatment options with [name] and his wife. We'll consider going back to oxaliplatin-based therapy when there is disease progression. The plan is to continue every three-week chemotherapy with a restaging CT in 2-3 months.  Betsy Coder, MD  07/08/2015  9:47 AM

## 2015-07-08 NOTE — Patient Instructions (Signed)
.  [facility] Discharge Instructions for Patients Receiving Chemotherapy  Today you received the following chemotherapy agents Avastin/Irinotecan/Leucovorin/Fluorouracil.  To help prevent nausea and vomiting after your treatment, we encourage you to take your nausea medication as directed.   If you develop nausea and vomiting that is not controlled by your nausea medication, call the clinic.   BELOW ARE SYMPTOMS THAT SHOULD BE REPORTED IMMEDIATELY:  *FEVER GREATER THAN 100.5 F  *CHILLS WITH OR WITHOUT FEVER  NAUSEA AND VOMITING THAT IS NOT CONTROLLED WITH YOUR NAUSEA MEDICATION  *UNUSUAL SHORTNESS OF BREATH  *UNUSUAL BRUISING OR BLEEDING  TENDERNESS IN MOUTH AND THROAT WITH OR WITHOUT PRESENCE OF ULCERS  *URINARY PROBLEMS  *BOWEL PROBLEMS  UNUSUAL RASH Items with * indicate a potential emergency and should be followed up as soon as possible.  Feel free to call the clinic you have any questions or concerns. The clinic phone number is [phone].  Please show the CHEMO ALERT CARD at check-in to the Emergency Department and triage nurse.    

## 2015-07-08 NOTE — Telephone Encounter (Signed)
GAVE PATIENT WIFE AVS REPORT AND APPOINTMENTS FOR February AND MARCH.

## 2015-07-08 NOTE — Progress Notes (Signed)
Oncology Nurse Navigator Documentation  Oncology Nurse Navigator Flowsheets 07/08/2015  Navigator Location [facility]-Med Onc  Navigator Encounter Type Treatment  Patient Visit Type MedOnc  Treatment Phase Treatment # 7 FOLFIRI/Avastin  Barriers/Navigation Needs No Needs;No Questions;No barriers at this time  Interventions None required  Support Groups/Services GI Support Group info provided  Acuity Level 1  Time Spent with Patient 15  Met with patient and wife during chemotherapy treatment to provide support and assess for needs to promote continuity of care. 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  Merceda Elks, RN, BSN GI Oncology [place]

## 2015-07-10 ENCOUNTER — Ambulatory Visit (HOSPITAL_BASED_OUTPATIENT_CLINIC_OR_DEPARTMENT_OTHER): Payer: 59

## 2015-07-10 VITALS — BP 137/80 | HR 79 | Temp 98.2°F

## 2015-07-10 DIAGNOSIS — C189 Malignant neoplasm of colon, unspecified: Secondary | ICD-10-CM

## 2015-07-10 DIAGNOSIS — Z5189 Encounter for other specified aftercare: Secondary | ICD-10-CM | POA: Diagnosis not present

## 2015-07-10 MED ORDER — PEGFILGRASTIM INJECTION 6 MG/0.6ML ~~LOC~~
6.0000 mg | PREFILLED_SYRINGE | Freq: Once | SUBCUTANEOUS | Status: AC
  Administered 2015-07-10: 6 mg via SUBCUTANEOUS
  Filled 2015-07-10: qty 0.6

## 2015-07-10 MED ORDER — SODIUM CHLORIDE 0.9 % IJ SOLN
10.0000 mL | INTRAMUSCULAR | Status: DC | PRN
  Administered 2015-07-10: 10 mL
  Filled 2015-07-10: qty 10

## 2015-07-10 MED ORDER — HEPARIN SOD (PORK) LOCK FLUSH 100 UNIT/ML IV SOLN
500.0000 [IU] | Freq: Once | INTRAVENOUS | Status: AC | PRN
  Administered 2015-07-10: 500 [IU]
  Filled 2015-07-10: qty 5

## 2015-07-15 ENCOUNTER — Other Ambulatory Visit: Payer: Self-pay | Admitting: Nurse Practitioner

## 2015-07-17 NOTE — Telephone Encounter (Signed)
[facility] pharmacy called for decadron refill. This RN was unsure if there has been any change in decadron dosing so forwarded to MD.

## 2015-07-19 NOTE — Telephone Encounter (Signed)
Chart Reviewed.

## 2015-07-28 ENCOUNTER — Other Ambulatory Visit: Payer: Self-pay | Admitting: Oncology

## 2015-07-29 ENCOUNTER — Telehealth: Payer: Self-pay | Admitting: Oncology

## 2015-07-29 ENCOUNTER — Ambulatory Visit (HOSPITAL_BASED_OUTPATIENT_CLINIC_OR_DEPARTMENT_OTHER): Payer: 59

## 2015-07-29 ENCOUNTER — Ambulatory Visit (HOSPITAL_BASED_OUTPATIENT_CLINIC_OR_DEPARTMENT_OTHER): Payer: 59 | Admitting: Oncology

## 2015-07-29 ENCOUNTER — Other Ambulatory Visit (HOSPITAL_BASED_OUTPATIENT_CLINIC_OR_DEPARTMENT_OTHER): Payer: 59

## 2015-07-29 VITALS — BP 123/94 | HR 63 | Temp 98.6°F | Resp 18 | Ht 71.0 in | Wt 259.2 lb

## 2015-07-29 DIAGNOSIS — K068 Other specified disorders of gingiva and edentulous alveolar ridge: Secondary | ICD-10-CM

## 2015-07-29 DIAGNOSIS — C787 Secondary malignant neoplasm of liver and intrahepatic bile duct: Secondary | ICD-10-CM

## 2015-07-29 DIAGNOSIS — Z5112 Encounter for antineoplastic immunotherapy: Secondary | ICD-10-CM

## 2015-07-29 DIAGNOSIS — C189 Malignant neoplasm of colon, unspecified: Secondary | ICD-10-CM

## 2015-07-29 DIAGNOSIS — Z5111 Encounter for antineoplastic chemotherapy: Secondary | ICD-10-CM

## 2015-07-29 DIAGNOSIS — C182 Malignant neoplasm of ascending colon: Secondary | ICD-10-CM

## 2015-07-29 DIAGNOSIS — C183 Malignant neoplasm of hepatic flexure: Secondary | ICD-10-CM

## 2015-07-29 LAB — CBC WITH DIFFERENTIAL/PLATELET
BASO%: 0.9 % (ref 0.0–2.0)
BASOS ABS: 0.1 10*3/uL (ref 0.0–0.1)
EOS%: 4.4 % (ref 0.0–7.0)
Eosinophils Absolute: 0.3 10*3/uL (ref 0.0–0.5)
HCT: 42.3 % (ref 38.4–49.9)
HGB: 14.1 g/dL (ref 13.0–17.1)
LYMPH%: 19.8 % (ref 14.0–49.0)
MCH: 34.6 pg — AB (ref 27.2–33.4)
MCHC: 33.4 g/dL (ref 32.0–36.0)
MCV: 103.6 fL — ABNORMAL HIGH (ref 79.3–98.0)
MONO#: 0.6 10*3/uL (ref 0.1–0.9)
MONO%: 9.9 % (ref 0.0–14.0)
NEUT#: 4.2 10*3/uL (ref 1.5–6.5)
NEUT%: 65 % (ref 39.0–75.0)
Platelets: 172 10*3/uL (ref 140–400)
RBC: 4.08 10*6/uL — ABNORMAL LOW (ref 4.20–5.82)
RDW: 15.5 % — AB (ref 11.0–14.6)
WBC: 6.4 10*3/uL (ref 4.0–10.3)
lymph#: 1.3 10*3/uL (ref 0.9–3.3)

## 2015-07-29 LAB — COMPREHENSIVE METABOLIC PANEL
ALK PHOS: 58 U/L (ref 40–150)
ALT: 47 U/L (ref 0–55)
ANION GAP: 7 meq/L (ref 3–11)
AST: 29 U/L (ref 5–34)
Albumin: 3.4 g/dL — ABNORMAL LOW (ref 3.5–5.0)
BUN: 22.4 mg/dL (ref 7.0–26.0)
CALCIUM: 8.9 mg/dL (ref 8.4–10.4)
CHLORIDE: 107 meq/L (ref 98–109)
CO2: 27 mEq/L (ref 22–29)
Creatinine: 1.3 mg/dL (ref 0.7–1.3)
EGFR: 60 mL/min/{1.73_m2} — ABNORMAL LOW (ref >90)
Glucose: 134 mg/dl (ref 70–140)
POTASSIUM: 4.4 meq/L (ref 3.5–5.1)
SODIUM: 141 meq/L (ref 136–145)
Total Bilirubin: 0.58 mg/dL (ref 0.20–1.20)
Total Protein: 6.3 g/dL — ABNORMAL LOW (ref 6.4–8.3)

## 2015-07-29 MED ORDER — DEXTROSE 5 % IV SOLN
240.0000 mg/m2 | Freq: Once | INTRAVENOUS | Status: AC
  Administered 2015-07-29: 562 mg via INTRAVENOUS
  Filled 2015-07-29: qty 28.1

## 2015-07-29 MED ORDER — SODIUM CHLORIDE 0.9 % IV SOLN
7.4000 mg/kg | Freq: Once | INTRAVENOUS | Status: AC
  Administered 2015-07-29: 800 mg via INTRAVENOUS
  Filled 2015-07-29: qty 32

## 2015-07-29 MED ORDER — FLUOROURACIL CHEMO INJECTION 2.5 GM/50ML
240.0000 mg/m2 | Freq: Once | INTRAVENOUS | Status: AC
  Administered 2015-07-29: 550 mg via INTRAVENOUS
  Filled 2015-07-29: qty 11

## 2015-07-29 MED ORDER — PALONOSETRON HCL INJECTION 0.25 MG/5ML
0.2500 mg | Freq: Once | INTRAVENOUS | Status: AC
  Administered 2015-07-29: 0.25 mg via INTRAVENOUS

## 2015-07-29 MED ORDER — SODIUM CHLORIDE 0.9 % IV SOLN
1600.0000 mg/m2 | INTRAVENOUS | Status: DC
  Administered 2015-07-29: 3750 mg via INTRAVENOUS
  Filled 2015-07-29: qty 75

## 2015-07-29 MED ORDER — ATROPINE SULFATE 1 MG/ML IJ SOLN
0.5000 mg | Freq: Once | INTRAMUSCULAR | Status: AC | PRN
  Administered 2015-07-29: 0.5 mg via INTRAVENOUS

## 2015-07-29 MED ORDER — IRINOTECAN HCL CHEMO INJECTION 100 MG/5ML
180.0000 mg/m2 | Freq: Once | INTRAVENOUS | Status: AC
  Administered 2015-07-29: 422 mg via INTRAVENOUS
  Filled 2015-07-29: qty 6.67

## 2015-07-29 MED ORDER — ATROPINE SULFATE 1 MG/ML IJ SOLN
INTRAMUSCULAR | Status: AC
  Filled 2015-07-29: qty 1

## 2015-07-29 MED ORDER — SODIUM CHLORIDE 0.9 % IV SOLN
Freq: Once | INTRAVENOUS | Status: AC
  Administered 2015-07-29: 10:00:00 via INTRAVENOUS

## 2015-07-29 MED ORDER — SODIUM CHLORIDE 0.9 % IV SOLN
Freq: Once | INTRAVENOUS | Status: AC
  Administered 2015-07-29: 10:00:00 via INTRAVENOUS
  Filled 2015-07-29: qty 5

## 2015-07-29 MED ORDER — PALONOSETRON HCL INJECTION 0.25 MG/5ML
INTRAVENOUS | Status: AC
  Filled 2015-07-29: qty 5

## 2015-07-29 NOTE — Progress Notes (Signed)
[place] OFFICE PROGRESS NOTE   Diagnosis: Colon cancer  INTERVAL HISTORY:   Bryan Fields returns as scheduled. He completed another cycle of FOLFIRI/Avastin 07/08/2015. He continues to have gum soreness and bleeding. No mouth ulcers. No diarrhea. Good appetite and energy level. No symptom of thrombosis.  Objective:  Vital signs in last 24 hours:  Blood pressure 123/94, pulse 63, temperature 98.6 F (37 C), temperature source Oral, resp. rate 18, height '5\' 11"'$  (1.803 m), weight 259 lb 3.2 oz (117.572 kg), SpO2 97 %.    HEENT: No thrush or ulcers. Erythema at the upper gum Resp: Lungs clear bilaterally Cardio: Regular rate and rhythm GI: No hepatosplenomegaly, nontender, upper abdominal wall mass measures 6 cm in transverse and vertical dimension Vascular: No leg edema  Skin: Palms without erythema   Portacath/PICC-without erythema  Lab Results:  Lab Results  Component Value Date   WBC 6.4 07/29/2015   HGB 14.1 07/29/2015   HCT 42.3 07/29/2015   MCV 103.6* 07/29/2015   PLT 172 07/29/2015   NEUTROABS 4.2 07/29/2015     Medications: I have reviewed the patient's current medications.  Assessment/Plan: 1. 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 Microsatellite stable; preserved expression of major and minor MMR proteins. He began treatment with FOLFOX in December 2011. He completed cycle #12 on 10/27/2010. Oxaliplatin was held with cycles number 7 through 10 and resumed with cycle #11. 2. History of neutropenia secondary to chemotherapy. 3. History of thrombocytopenia secondary chemotherapy. 4. He did not undergo a preoperative colonoscopy. He underwent a colonoscopy on 12/19/2010 with findings of a 1 cm polyp at 90 cm from the anal verge, minimal polyp at 30 cm from the anal verge and minimal diverticulosis. Colonoscopy  03/18/2012-negative. 5. 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 He began study drug on 05/13/2010. 6. Status post Port-A-Cath placement. The Port-A-Cath has been removed. 7. 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 9. Low attenuation lesion in the caudate lobe of the liver on the CT 04/11/2012-? Cyst. MRI liver on 04/20/2012 favored the caudate lobe liver lesion to represent a cyst or complex cyst. CT abdomen/pelvis 04/17/2013 with continued enlargement of hypovascular lesion in the caudate lobe of the liver.  PET scan 10/27/2011 confirmed a hypermetabolic caudate lobe liver lesion, tiny indeterminate lung nodules, and a 9 mm level II left neck node   CT biopsy of the liver lesion 11/07/2013 confirmed adenocarcinoma  Left liver and caudate resection 01/15/2014-pathology confirmed metastatic colon cancer, and negative surgical margins  Surveillance CT scans 07/24/2014 consistent with recurrent disease involving upper abdominal lymph nodes, peritoneal implants, and an anterior abdominal wall mass  Status post biopsy of the anterior abdominal wall mass 08/06/2014 with pathology showing metastatic adenocarcinoma consistent with a colon primary.  UGT1A1 1/28  Cycle 1 FOLFIRI/Avastin on the [facility] 1317 08/20/2014  Cycle 2 FOLFIRI/Avastin 09/03/2014  Cycle 3 held on 09/17/2014 due to neutropenia  Cycle 3 FOLFIRI/Avastin 09/25/2014. Neulasta added beginning with cycle 3.  Cycle 4 FOLFIRI/Avastin 10/08/2014  Restaging CT scans 10/18/2014 with slight interval increase in the size of the soft tissue mass in the subcutaneous fat of the epigastric region. Underlying peritoneal implants, probable focus of residual disease along the resected margin of the liver and hepatoduodenal ligament lymphadenopathy all appeared slightly  improved. No new sites of metastatic disease otherwise noted. New left lower lobe bronchopneumonia.  Cycle 5 FOLFIRI/Avastin 10/22/2014  Cycle 6 FOLFIRI/Avastin 11/06/2014  Cycle 7 FOLFIRI Avastin 11/19/2014  Cycle 8 FOLFIRI/Avastin 12/03/2014  CT 12/14/2014 with stable disease  Cycle 9 FOLFIRI/Avastin 12/17/2014  Cycle 10 FOLFIRI/Avastin 01/07/2015 (5-fluorouracil and leucovorin dose reduced)  Cycle 11 FOLFIRI/Avastin 01/21/2015  Cycle 12 FOLFIRI/Avastin 02/04/2015  CT 02/13/2015 with stable disease  Cycle 13 FOLFIRI/Avastin 02/25/2015-changed to an every three-week protocol, now off study  Avastin held with cycle 14 FOLFIRI on 03/25/2015 secondary to gingivitis  Restaging CTs 05/20/2015-slight increase in the size of the epigastric subcutaneous mass, stable peritoneal implants  FOLFIRI continued on a 3 week schedule  Avastin resumed 06/17/2015 10. Iron deposition within the liver noted on MRI 04/20/2012. The ferritin level was normal on 04/17/2013. Increased iron deposition noted on the left liver resection 01/15/2014 11. Pain/bleeding at the anus reported when here 04/24/2013. Status post evaluation by Dr. [name] with findings of an anal fissure. 12. Right face cystic lesion 13. Status post Port-A-Cath placement 08/14/2014 14. Delayed nausea following FOLFIRI, prophylactic Decadron added with cycle 2 with improvement. 15. Chest CT 10/18/2014 with new left lower lobe bronchopneumonia. Levaquin initiated. Resolved on CT 12/14/2014 16. Gum pain- mucositis?, Gingivitis?-Improved with discontinuation of Avastin 17. Pain/tenderness, mild erythema and full appearance over the maxillary sinuses. Maxillofacial CT 01/30/2015 with mild to moderate bilateral maxillary and sphenoid sinus inflammatory changes. Multifocal right maxillary dental periapical lucency. Status post a right upper tooth extraction with resolution of the pain   Disposition:  Bryan Fields appears stable. The  plan is to continue FOLFIRI/Avastin on a 3 week schedule. He will return for an office visit and chemotherapy 08/19/2015. We will schedule a CT at a 4-6 month interval.       Betsy Coder, MD  07/29/2015  9:08 AM

## 2015-07-29 NOTE — Telephone Encounter (Signed)
Gave patient avs report and appointments for February thru April.  °

## 2015-07-29 NOTE — Patient Instructions (Signed)
[place] Discharge Instructions for Patients Receiving Chemotherapy  Today you received the following chemotherapy agents; Avastin, Leucovorin, Irinotecan, Adrucil.   To help prevent nausea and vomiting after your treatment, we encourage you to take your nausea medication as directed.     If you develop nausea and vomiting that is not controlled by your nausea medication, call the clinic.   BELOW ARE SYMPTOMS THAT SHOULD BE REPORTED IMMEDIATELY:  *FEVER GREATER THAN 100.5 F  *CHILLS WITH OR WITHOUT FEVER  NAUSEA AND VOMITING THAT IS NOT CONTROLLED WITH YOUR NAUSEA MEDICATION  *UNUSUAL SHORTNESS OF BREATH  *UNUSUAL BRUISING OR BLEEDING  TENDERNESS IN MOUTH AND THROAT WITH OR WITHOUT PRESENCE OF ULCERS  *URINARY PROBLEMS  *BOWEL PROBLEMS  UNUSUAL RASH Items with * indicate a potential emergency and should be followed up as soon as possible.  Feel free to call the clinic you have any questions or concerns. The clinic phone number is [phone].  Please show the [place] at check-in to the Emergency Department and triage nurse.

## 2015-07-30 ENCOUNTER — Telehealth: Payer: Self-pay | Admitting: Medical Oncology

## 2015-07-30 NOTE — Telephone Encounter (Signed)
Surescripts received for decadron refill. Note there are 2 refills with 2/10 rx.

## 2015-07-31 ENCOUNTER — Ambulatory Visit: Payer: 59

## 2015-07-31 ENCOUNTER — Ambulatory Visit (HOSPITAL_BASED_OUTPATIENT_CLINIC_OR_DEPARTMENT_OTHER): Payer: 59

## 2015-07-31 VITALS — BP 143/76 | HR 72 | Temp 98.2°F | Resp 18

## 2015-07-31 DIAGNOSIS — Z5189 Encounter for other specified aftercare: Secondary | ICD-10-CM | POA: Diagnosis not present

## 2015-07-31 DIAGNOSIS — C189 Malignant neoplasm of colon, unspecified: Secondary | ICD-10-CM | POA: Diagnosis not present

## 2015-07-31 MED ORDER — HEPARIN SOD (PORK) LOCK FLUSH 100 UNIT/ML IV SOLN
500.0000 [IU] | Freq: Once | INTRAVENOUS | Status: AC | PRN
  Administered 2015-07-31: 500 [IU]
  Filled 2015-07-31: qty 5

## 2015-07-31 MED ORDER — PEGFILGRASTIM INJECTION 6 MG/0.6ML ~~LOC~~
6.0000 mg | PREFILLED_SYRINGE | Freq: Once | SUBCUTANEOUS | Status: AC
  Administered 2015-07-31: 6 mg via SUBCUTANEOUS
  Filled 2015-07-31: qty 0.6

## 2015-07-31 MED ORDER — SODIUM CHLORIDE 0.9 % IJ SOLN
10.0000 mL | INTRAMUSCULAR | Status: DC | PRN
  Administered 2015-07-31: 10 mL
  Filled 2015-07-31: qty 10

## 2015-07-31 NOTE — Progress Notes (Signed)
Neulasta injection given by infusion nurse after home infusion pump disconnected 

## 2015-07-31 NOTE — Patient Instructions (Signed)

## 2015-08-16 ENCOUNTER — Other Ambulatory Visit: Payer: Self-pay | Admitting: Oncology

## 2015-08-19 ENCOUNTER — Ambulatory Visit (HOSPITAL_BASED_OUTPATIENT_CLINIC_OR_DEPARTMENT_OTHER): Payer: 59 | Admitting: Nurse Practitioner

## 2015-08-19 ENCOUNTER — Telehealth: Payer: Self-pay | Admitting: *Deleted

## 2015-08-19 ENCOUNTER — Ambulatory Visit (HOSPITAL_BASED_OUTPATIENT_CLINIC_OR_DEPARTMENT_OTHER): Payer: 59

## 2015-08-19 ENCOUNTER — Other Ambulatory Visit (HOSPITAL_BASED_OUTPATIENT_CLINIC_OR_DEPARTMENT_OTHER): Payer: 59

## 2015-08-19 ENCOUNTER — Telehealth: Payer: Self-pay | Admitting: Nurse Practitioner

## 2015-08-19 ENCOUNTER — Ambulatory Visit: Payer: 59

## 2015-08-19 ENCOUNTER — Telehealth: Payer: Self-pay | Admitting: Oncology

## 2015-08-19 VITALS — BP 131/75 | HR 66 | Temp 98.4°F | Resp 18 | Ht 71.0 in | Wt 259.5 lb

## 2015-08-19 VITALS — BP 122/75 | HR 65 | Resp 18

## 2015-08-19 DIAGNOSIS — C182 Malignant neoplasm of ascending colon: Secondary | ICD-10-CM

## 2015-08-19 DIAGNOSIS — C787 Secondary malignant neoplasm of liver and intrahepatic bile duct: Secondary | ICD-10-CM | POA: Diagnosis not present

## 2015-08-19 DIAGNOSIS — Z5111 Encounter for antineoplastic chemotherapy: Secondary | ICD-10-CM

## 2015-08-19 DIAGNOSIS — C183 Malignant neoplasm of hepatic flexure: Secondary | ICD-10-CM

## 2015-08-19 DIAGNOSIS — K068 Other specified disorders of gingiva and edentulous alveolar ridge: Secondary | ICD-10-CM

## 2015-08-19 DIAGNOSIS — Z95828 Presence of other vascular implants and grafts: Secondary | ICD-10-CM

## 2015-08-19 DIAGNOSIS — Z5112 Encounter for antineoplastic immunotherapy: Secondary | ICD-10-CM | POA: Diagnosis not present

## 2015-08-19 DIAGNOSIS — C189 Malignant neoplasm of colon, unspecified: Secondary | ICD-10-CM

## 2015-08-19 LAB — CBC WITH DIFFERENTIAL/PLATELET
BASO%: 0.9 % (ref 0.0–2.0)
BASOS ABS: 0.1 10*3/uL (ref 0.0–0.1)
EOS ABS: 0.3 10*3/uL (ref 0.0–0.5)
EOS%: 3.8 % (ref 0.0–7.0)
HEMATOCRIT: 41.7 % (ref 38.4–49.9)
HEMOGLOBIN: 13.9 g/dL (ref 13.0–17.1)
LYMPH%: 23.1 % (ref 14.0–49.0)
MCH: 34.6 pg — ABNORMAL HIGH (ref 27.2–33.4)
MCHC: 33.4 g/dL (ref 32.0–36.0)
MCV: 103.6 fL — AB (ref 79.3–98.0)
MONO#: 0.6 10*3/uL (ref 0.1–0.9)
MONO%: 9.4 % (ref 0.0–14.0)
NEUT%: 62.8 % (ref 39.0–75.0)
NEUTROS ABS: 4.2 10*3/uL (ref 1.5–6.5)
PLATELETS: 161 10*3/uL (ref 140–400)
RBC: 4.03 10*6/uL — ABNORMAL LOW (ref 4.20–5.82)
RDW: 14.7 % — AB (ref 11.0–14.6)
WBC: 6.7 10*3/uL (ref 4.0–10.3)
lymph#: 1.5 10*3/uL (ref 0.9–3.3)

## 2015-08-19 LAB — COMPREHENSIVE METABOLIC PANEL
ALBUMIN: 3.4 g/dL — AB (ref 3.5–5.0)
ALK PHOS: 60 U/L (ref 40–150)
ALT: 43 U/L (ref 0–55)
ANION GAP: 7 meq/L (ref 3–11)
AST: 28 U/L (ref 5–34)
BILIRUBIN TOTAL: 0.43 mg/dL (ref 0.20–1.20)
BUN: 20.7 mg/dL (ref 7.0–26.0)
CALCIUM: 8.9 mg/dL (ref 8.4–10.4)
CO2: 25 mEq/L (ref 22–29)
Chloride: 109 mEq/L (ref 98–109)
Creatinine: 1.1 mg/dL (ref 0.7–1.3)
EGFR: 75 mL/min/{1.73_m2} — AB (ref >90)
GLUCOSE: 119 mg/dL (ref 70–140)
Potassium: 3.9 mEq/L (ref 3.5–5.1)
Sodium: 141 mEq/L (ref 136–145)
TOTAL PROTEIN: 6.3 g/dL — AB (ref 6.4–8.3)

## 2015-08-19 LAB — UA PROTEIN, DIPSTICK - CHCC: PROTEIN: NEGATIVE mg/dL

## 2015-08-19 MED ORDER — SODIUM CHLORIDE 0.9 % IV SOLN
7.3000 mg/kg | Freq: Once | INTRAVENOUS | Status: AC
  Administered 2015-08-19: 800 mg via INTRAVENOUS
  Filled 2015-08-19: qty 32

## 2015-08-19 MED ORDER — HEPARIN SOD (PORK) LOCK FLUSH 100 UNIT/ML IV SOLN
500.0000 [IU] | Freq: Once | INTRAVENOUS | Status: DC | PRN
  Filled 2015-08-19: qty 5

## 2015-08-19 MED ORDER — SODIUM CHLORIDE 0.9 % IV SOLN
Freq: Once | INTRAVENOUS | Status: AC
  Administered 2015-08-19: 11:00:00 via INTRAVENOUS
  Filled 2015-08-19: qty 5

## 2015-08-19 MED ORDER — SODIUM CHLORIDE 0.9 % IJ SOLN
10.0000 mL | INTRAMUSCULAR | Status: DC | PRN
  Filled 2015-08-19: qty 10

## 2015-08-19 MED ORDER — FLUOROURACIL CHEMO INJECTION 2.5 GM/50ML
240.0000 mg/m2 | Freq: Once | INTRAVENOUS | Status: AC
  Administered 2015-08-19: 550 mg via INTRAVENOUS
  Filled 2015-08-19: qty 11

## 2015-08-19 MED ORDER — SODIUM CHLORIDE 0.9 % IV SOLN
Freq: Once | INTRAVENOUS | Status: AC
  Administered 2015-08-19: 11:00:00 via INTRAVENOUS

## 2015-08-19 MED ORDER — PALONOSETRON HCL INJECTION 0.25 MG/5ML
0.2500 mg | Freq: Once | INTRAVENOUS | Status: AC
  Administered 2015-08-19: 0.25 mg via INTRAVENOUS

## 2015-08-19 MED ORDER — FLUOROURACIL CHEMO INJECTION 5 GM/100ML
1600.0000 mg/m2 | INTRAVENOUS | Status: DC
  Administered 2015-08-19: 3750 mg via INTRAVENOUS
  Filled 2015-08-19: qty 75

## 2015-08-19 MED ORDER — ATROPINE SULFATE 1 MG/ML IJ SOLN
INTRAMUSCULAR | Status: AC
  Filled 2015-08-19: qty 1

## 2015-08-19 MED ORDER — ATROPINE SULFATE 1 MG/ML IJ SOLN
0.5000 mg | Freq: Once | INTRAMUSCULAR | Status: AC | PRN
  Administered 2015-08-19: 0.5 mg via INTRAVENOUS

## 2015-08-19 MED ORDER — IRINOTECAN HCL CHEMO INJECTION 100 MG/5ML
180.0000 mg/m2 | Freq: Once | INTRAVENOUS | Status: AC
  Administered 2015-08-19: 422 mg via INTRAVENOUS
  Filled 2015-08-19: qty 6.67

## 2015-08-19 MED ORDER — LEUCOVORIN CALCIUM INJECTION 350 MG
240.0000 mg/m2 | Freq: Once | INTRAVENOUS | Status: AC
  Administered 2015-08-19: 562 mg via INTRAVENOUS
  Filled 2015-08-19: qty 28.1

## 2015-08-19 MED ORDER — SODIUM CHLORIDE 0.9% FLUSH
10.0000 mL | INTRAVENOUS | Status: DC | PRN
  Administered 2015-08-19: 10 mL via INTRAVENOUS
  Filled 2015-08-19: qty 10

## 2015-08-19 MED ORDER — PALONOSETRON HCL INJECTION 0.25 MG/5ML
INTRAVENOUS | Status: AC
  Filled 2015-08-19: qty 5

## 2015-08-19 NOTE — Telephone Encounter (Signed)
Per staff message and POF I have scheduled appts. Advised scheduler of appts. JMW  

## 2015-08-19 NOTE — Progress Notes (Signed)
[facility] OFFICE PROGRESS NOTE   Diagnosis:  Colon cancer  INTERVAL HISTORY:   Mr. Hartlage returns as scheduled. He completed another cycle of FOLFIRI/Avastin 07/29/2015. He has intermittent nausea for about 1 week following chemotherapy. No vomiting. He had a few mouth sores. They are resolving. No diarrhea. He continues to have gum bleeding. No shortness of breath or chest pain. No leg swelling or calf pain.  Objective:  Vital signs in last 24 hours:  Blood pressure 131/75, pulse 66, temperature 98.4 F (36.9 C), temperature source Oral, resp. rate 18, height 5\' 11"  (1.803 m), weight 259 lb 8 oz (117.708 kg), SpO2 98 %.    HEENT: No thrush or ulcers. Resp: Lungs clear bilaterally. Cardio: Regular rate and rhythm. GI: No hepatomegaly. Upper abdominal wall mass measures 6 cm in transverse and vertical dimension. Vascular: No leg edema.  Port-A-Cath without erythema.    Lab Results:  Lab Results  Component Value Date   WBC 6.7 08/19/2015   HGB 13.9 08/19/2015   HCT 41.7 08/19/2015   MCV 103.6* 08/19/2015   PLT 161 08/19/2015   NEUTROABS 4.2 08/19/2015    Imaging:  No results found.  Medications: I have reviewed the patient's current medications.  Assessment/Plan: 1. 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 Microsatellite stable; preserved expression of major and minor MMR proteins. He began treatment with FOLFOX in December 2011. He completed cycle #12 on 10/27/2010. Oxaliplatin was held with cycles number 7 through 10 and resumed with cycle #11. 2. History of neutropenia secondary to chemotherapy. 3. History of thrombocytopenia secondary chemotherapy. 4. He did not undergo a preoperative colonoscopy. He underwent a colonoscopy on 12/19/2010 with findings of a 1 cm polyp at 90 cm from the anal verge, minimal polyp at 30 cm from the anal verge  and minimal diverticulosis. Colonoscopy 03/18/2012-negative. 5. 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 He began study drug on 05/13/2010. 6. Status post Port-A-Cath placement. The Port-A-Cath has been removed. 7. 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 9. Low attenuation lesion in the caudate lobe of the liver on the CT 04/11/2012-? Cyst. MRI liver on 04/20/2012 favored the caudate lobe liver lesion to represent a cyst or complex cyst. CT abdomen/pelvis 04/17/2013 with continued enlargement of hypovascular lesion in the caudate lobe of the liver.  PET scan 10/27/2011 confirmed a hypermetabolic caudate lobe liver lesion, tiny indeterminate lung nodules, and a 9 mm level II left neck node   CT biopsy of the liver lesion 11/07/2013 confirmed adenocarcinoma  Left liver and caudate resection 01/15/2014-pathology confirmed metastatic colon cancer, and negative surgical margins  Surveillance CT scans 07/24/2014 consistent with recurrent disease involving upper abdominal lymph nodes, peritoneal implants, and an anterior abdominal wall mass  Status post biopsy of the anterior abdominal wall mass 08/06/2014 with pathology showing metastatic adenocarcinoma consistent with a colon primary.  UGT1A1 1/28  Cycle 1 FOLFIRI/Avastin on the [facility] 1317 08/20/2014  Cycle 2 FOLFIRI/Avastin 09/03/2014  Cycle 3 held on 09/17/2014 due to neutropenia  Cycle 3 FOLFIRI/Avastin 09/25/2014. Neulasta added beginning with cycle 3.  Cycle 4 FOLFIRI/Avastin 10/08/2014  Restaging CT scans 10/18/2014 with slight interval increase in the size of the soft tissue mass in the subcutaneous fat of the epigastric region. Underlying peritoneal implants, probable focus of  residual disease along the resected margin of the liver and hepatoduodenal ligament  lymphadenopathy all appeared slightly improved. No new sites of metastatic disease otherwise noted. New left lower lobe bronchopneumonia.  Cycle 5 FOLFIRI/Avastin 10/22/2014  Cycle 6 FOLFIRI/Avastin 11/06/2014  Cycle 7 FOLFIRI Avastin 11/19/2014  Cycle 8 FOLFIRI/Avastin 12/03/2014  CT 12/14/2014 with stable disease  Cycle 9 FOLFIRI/Avastin 12/17/2014  Cycle 10 FOLFIRI/Avastin 01/07/2015 (5-fluorouracil and leucovorin dose reduced)  Cycle 11 FOLFIRI/Avastin 01/21/2015  Cycle 12 FOLFIRI/Avastin 02/04/2015  CT 02/13/2015 with stable disease  Cycle 13 FOLFIRI/Avastin 02/25/2015-changed to an every three-week protocol, now off study  Avastin held with cycle 14 FOLFIRI on 03/25/2015 secondary to gingivitis  Restaging CTs 05/20/2015-slight increase in the size of the epigastric subcutaneous mass, stable peritoneal implants  FOLFIRI continued on a 3 week schedule  Avastin resumed 06/17/2015 10. Iron deposition within the liver noted on MRI 04/20/2012. The ferritin level was normal on 04/17/2013. Increased iron deposition noted on the left liver resection 01/15/2014 11. Pain/bleeding at the anus reported when here 04/24/2013. Status post evaluation by Dr. [name] with findings of an anal fissure. 12. Right face cystic lesion 13. Status post Port-A-Cath placement 08/14/2014 14. Delayed nausea following FOLFIRI, prophylactic Decadron added with cycle 2 with improvement. 15. Chest CT 10/18/2014 with new left lower lobe bronchopneumonia. Levaquin initiated. Resolved on CT 12/14/2014 16. Gum pain- mucositis?, Gingivitis?-Improved with discontinuation of Avastin 17. Pain/tenderness, mild erythema and full appearance over the maxillary sinuses. Maxillofacial CT 01/30/2015 with mild to moderate bilateral maxillary and sphenoid sinus inflammatory changes. Multifocal right maxillary dental periapical lucency. Status post a right upper tooth extraction with resolution of the pain    Disposition:  Mr. Shovlin appears stable. The plan is to continue FOLFIRI/Avastin on a 3 week schedule. He will return for a follow-up visit in 3 weeks. The plan is for a restaging CT evaluation at a 4-6 month interval.    Ned Card ANP/GNP-BC   08/19/2015  9:45 AM

## 2015-08-19 NOTE — Telephone Encounter (Signed)
Gave and printed appt sched and avs for pt for March and April....CC sent msg to [facility]

## 2015-08-19 NOTE — Patient Instructions (Signed)
[place] Discharge Instructions for Patients Receiving Chemotherapy  Today you received the following chemotherapy agents:  Fluorouracil, Leucovorin, Irinotecan, Avastin  To help prevent nausea and vomiting after your treatment, we encourage you to take your nausea medication as prescribed.   If you develop nausea and vomiting that is not controlled by your nausea medication, call the clinic.   BELOW ARE SYMPTOMS THAT SHOULD BE REPORTED IMMEDIATELY:  *FEVER GREATER THAN 100.5 F  *CHILLS WITH OR WITHOUT FEVER  NAUSEA AND VOMITING THAT IS NOT CONTROLLED WITH YOUR NAUSEA MEDICATION  *UNUSUAL SHORTNESS OF BREATH  *UNUSUAL BRUISING OR BLEEDING  TENDERNESS IN MOUTH AND THROAT WITH OR WITHOUT PRESENCE OF ULCERS  *URINARY PROBLEMS  *BOWEL PROBLEMS  UNUSUAL RASH Items with * indicate a potential emergency and should be followed up as soon as possible.  Feel free to call the clinic you have any questions or concerns. The clinic phone number is [phone].  Please show the [place] at check-in to the Emergency Department and triage nurse.

## 2015-08-19 NOTE — Telephone Encounter (Signed)
per pof to sch pt appt-sent MW email to sch pt appt-pt to get updated copy b4 leaving trmt

## 2015-08-19 NOTE — Patient Instructions (Signed)

## 2015-08-21 ENCOUNTER — Ambulatory Visit: Payer: 59

## 2015-08-21 ENCOUNTER — Ambulatory Visit (HOSPITAL_BASED_OUTPATIENT_CLINIC_OR_DEPARTMENT_OTHER): Payer: 59

## 2015-08-21 VITALS — BP 136/82 | HR 74 | Temp 98.3°F | Resp 18

## 2015-08-21 DIAGNOSIS — Z5189 Encounter for other specified aftercare: Secondary | ICD-10-CM | POA: Diagnosis not present

## 2015-08-21 DIAGNOSIS — C787 Secondary malignant neoplasm of liver and intrahepatic bile duct: Secondary | ICD-10-CM

## 2015-08-21 DIAGNOSIS — C189 Malignant neoplasm of colon, unspecified: Secondary | ICD-10-CM

## 2015-08-21 DIAGNOSIS — C182 Malignant neoplasm of ascending colon: Secondary | ICD-10-CM

## 2015-08-21 MED ORDER — SODIUM CHLORIDE 0.9 % IJ SOLN
10.0000 mL | INTRAMUSCULAR | Status: DC | PRN
  Administered 2015-08-21: 10 mL
  Filled 2015-08-21: qty 10

## 2015-08-21 MED ORDER — HEPARIN SOD (PORK) LOCK FLUSH 100 UNIT/ML IV SOLN
500.0000 [IU] | Freq: Once | INTRAVENOUS | Status: AC | PRN
  Administered 2015-08-21: 500 [IU]
  Filled 2015-08-21: qty 5

## 2015-08-21 MED ORDER — PEGFILGRASTIM INJECTION 6 MG/0.6ML ~~LOC~~
6.0000 mg | PREFILLED_SYRINGE | Freq: Once | SUBCUTANEOUS | Status: AC
  Administered 2015-08-21: 6 mg via SUBCUTANEOUS
  Filled 2015-08-21: qty 0.6

## 2015-08-21 NOTE — Patient Instructions (Signed)

## 2015-08-30 ENCOUNTER — Other Ambulatory Visit: Payer: 59

## 2015-08-30 ENCOUNTER — Ambulatory Visit: Payer: 59 | Admitting: Nurse Practitioner

## 2015-09-08 ENCOUNTER — Other Ambulatory Visit: Payer: Self-pay | Admitting: Oncology

## 2015-09-09 ENCOUNTER — Telehealth: Payer: Self-pay | Admitting: Oncology

## 2015-09-09 ENCOUNTER — Other Ambulatory Visit (HOSPITAL_BASED_OUTPATIENT_CLINIC_OR_DEPARTMENT_OTHER): Payer: 59

## 2015-09-09 ENCOUNTER — Ambulatory Visit (HOSPITAL_BASED_OUTPATIENT_CLINIC_OR_DEPARTMENT_OTHER): Payer: 59

## 2015-09-09 ENCOUNTER — Ambulatory Visit (HOSPITAL_BASED_OUTPATIENT_CLINIC_OR_DEPARTMENT_OTHER): Payer: 59 | Admitting: Oncology

## 2015-09-09 ENCOUNTER — Ambulatory Visit: Payer: 59

## 2015-09-09 VITALS — BP 136/79 | HR 77 | Temp 98.5°F | Resp 18 | Ht 71.0 in | Wt 264.3 lb

## 2015-09-09 DIAGNOSIS — C182 Malignant neoplasm of ascending colon: Secondary | ICD-10-CM | POA: Diagnosis not present

## 2015-09-09 DIAGNOSIS — Z95828 Presence of other vascular implants and grafts: Secondary | ICD-10-CM

## 2015-09-09 DIAGNOSIS — K068 Other specified disorders of gingiva and edentulous alveolar ridge: Secondary | ICD-10-CM

## 2015-09-09 DIAGNOSIS — C189 Malignant neoplasm of colon, unspecified: Secondary | ICD-10-CM

## 2015-09-09 DIAGNOSIS — R109 Unspecified abdominal pain: Secondary | ICD-10-CM

## 2015-09-09 DIAGNOSIS — C183 Malignant neoplasm of hepatic flexure: Secondary | ICD-10-CM

## 2015-09-09 DIAGNOSIS — C787 Secondary malignant neoplasm of liver and intrahepatic bile duct: Secondary | ICD-10-CM

## 2015-09-09 DIAGNOSIS — Z5112 Encounter for antineoplastic immunotherapy: Secondary | ICD-10-CM | POA: Diagnosis not present

## 2015-09-09 DIAGNOSIS — Z5111 Encounter for antineoplastic chemotherapy: Secondary | ICD-10-CM | POA: Diagnosis not present

## 2015-09-09 DIAGNOSIS — R1900 Intra-abdominal and pelvic swelling, mass and lump, unspecified site: Secondary | ICD-10-CM | POA: Diagnosis not present

## 2015-09-09 LAB — CBC WITH DIFFERENTIAL/PLATELET
BASO%: 0.8 % (ref 0.0–2.0)
BASOS ABS: 0 10*3/uL (ref 0.0–0.1)
EOS ABS: 0.2 10*3/uL (ref 0.0–0.5)
EOS%: 3.4 % (ref 0.0–7.0)
HEMATOCRIT: 40.7 % (ref 38.4–49.9)
HEMOGLOBIN: 13.7 g/dL (ref 13.0–17.1)
LYMPH#: 1.3 10*3/uL (ref 0.9–3.3)
LYMPH%: 21.5 % (ref 14.0–49.0)
MCH: 35.1 pg — AB (ref 27.2–33.4)
MCHC: 33.6 g/dL (ref 32.0–36.0)
MCV: 104.3 fL — AB (ref 79.3–98.0)
MONO#: 0.6 10*3/uL (ref 0.1–0.9)
MONO%: 9.4 % (ref 0.0–14.0)
NEUT#: 3.8 10*3/uL (ref 1.5–6.5)
NEUT%: 64.9 % (ref 39.0–75.0)
Platelets: 163 10*3/uL (ref 140–400)
RBC: 3.9 10*6/uL — ABNORMAL LOW (ref 4.20–5.82)
RDW: 14.4 % (ref 11.0–14.6)
WBC: 5.9 10*3/uL (ref 4.0–10.3)

## 2015-09-09 LAB — COMPREHENSIVE METABOLIC PANEL
ALBUMIN: 3.4 g/dL — AB (ref 3.5–5.0)
ALK PHOS: 56 U/L (ref 40–150)
ALT: 46 U/L (ref 0–55)
ANION GAP: 8 meq/L (ref 3–11)
AST: 30 U/L (ref 5–34)
BUN: 20.9 mg/dL (ref 7.0–26.0)
CALCIUM: 8.8 mg/dL (ref 8.4–10.4)
CHLORIDE: 108 meq/L (ref 98–109)
CO2: 24 mEq/L (ref 22–29)
Creatinine: 1.1 mg/dL (ref 0.7–1.3)
EGFR: 73 mL/min/{1.73_m2} — AB (ref >90)
Glucose: 206 mg/dl — ABNORMAL HIGH (ref 70–140)
POTASSIUM: 3.9 meq/L (ref 3.5–5.1)
Sodium: 140 mEq/L (ref 136–145)
Total Bilirubin: 0.5 mg/dL (ref 0.20–1.20)
Total Protein: 6.1 g/dL — ABNORMAL LOW (ref 6.4–8.3)

## 2015-09-09 LAB — UA PROTEIN, DIPSTICK - CHCC

## 2015-09-09 MED ORDER — DEXTROSE 5 % IV SOLN
240.0000 mg/m2 | Freq: Once | INTRAVENOUS | Status: AC
  Administered 2015-09-09: 562 mg via INTRAVENOUS
  Filled 2015-09-09: qty 28.1

## 2015-09-09 MED ORDER — SODIUM CHLORIDE 0.9 % IV SOLN
1600.0000 mg/m2 | INTRAVENOUS | Status: DC
  Administered 2015-09-09: 3750 mg via INTRAVENOUS
  Filled 2015-09-09: qty 75

## 2015-09-09 MED ORDER — SODIUM CHLORIDE 0.9% FLUSH
10.0000 mL | INTRAVENOUS | Status: DC | PRN
  Administered 2015-09-09: 10 mL via INTRAVENOUS
  Filled 2015-09-09: qty 10

## 2015-09-09 MED ORDER — FLUOROURACIL CHEMO INJECTION 2.5 GM/50ML
240.0000 mg/m2 | Freq: Once | INTRAVENOUS | Status: AC
  Administered 2015-09-09: 550 mg via INTRAVENOUS
  Filled 2015-09-09: qty 11

## 2015-09-09 MED ORDER — PALONOSETRON HCL INJECTION 0.25 MG/5ML
0.2500 mg | Freq: Once | INTRAVENOUS | Status: AC
  Administered 2015-09-09: 0.25 mg via INTRAVENOUS

## 2015-09-09 MED ORDER — SODIUM CHLORIDE 0.9 % IV SOLN
Freq: Once | INTRAVENOUS | Status: AC
  Administered 2015-09-09: 12:00:00 via INTRAVENOUS
  Filled 2015-09-09: qty 5

## 2015-09-09 MED ORDER — PALONOSETRON HCL INJECTION 0.25 MG/5ML
INTRAVENOUS | Status: AC
  Filled 2015-09-09: qty 5

## 2015-09-09 MED ORDER — ATROPINE SULFATE 1 MG/ML IJ SOLN
INTRAMUSCULAR | Status: AC
  Filled 2015-09-09: qty 1

## 2015-09-09 MED ORDER — ATROPINE SULFATE 1 MG/ML IJ SOLN
0.5000 mg | Freq: Once | INTRAMUSCULAR | Status: AC | PRN
  Administered 2015-09-09: 0.5 mg via INTRAVENOUS

## 2015-09-09 MED ORDER — SODIUM CHLORIDE 0.9 % IV SOLN
Freq: Once | INTRAVENOUS | Status: AC
  Administered 2015-09-09: 11:00:00 via INTRAVENOUS

## 2015-09-09 MED ORDER — IRINOTECAN HCL CHEMO INJECTION 100 MG/5ML
180.0000 mg/m2 | Freq: Once | INTRAVENOUS | Status: AC
  Administered 2015-09-09: 422 mg via INTRAVENOUS
  Filled 2015-09-09: qty 6.67

## 2015-09-09 MED ORDER — SODIUM CHLORIDE 0.9 % IV SOLN
7.4000 mg/kg | Freq: Once | INTRAVENOUS | Status: AC
  Administered 2015-09-09: 800 mg via INTRAVENOUS
  Filled 2015-09-09: qty 32

## 2015-09-09 NOTE — Progress Notes (Signed)
[facility] OFFICE PROGRESS NOTE   Diagnosis: Colon cancer  INTERVAL HISTORY:   Mr. Banghart returns as scheduled. He completed another cycle of FOLFIRI/Avastin on 08/19/2015. He reports "loosening "of 4 teeth at the lower anterior gum line following chemotherapy. One tooth remains "lupus ". He otherwise feels well. He continues to have low abdominal pain following chemotherapy. No diarrhea or nausea.   Objective:  Vital signs in last 24 hours:  Blood pressure 136/79, pulse 77, temperature 98.5 F (36.9 C), temperature source Oral, resp. rate 18, height 5\' 11"  (1.803 m), weight 264 lb 4.8 oz (119.886 kg), SpO2 99 %.    HEENT: No thrush or ulcers. No bleeding. The lower anterior teeth are irregularly spaced. I cannot appreciate any loosening. Resp: Lungs clear bilaterally Cardio: Regular rate and rhythm GI: No hepatomegaly, nontender Vascular: No leg edema  Skin: Anterior abdominal wall mass with superficial soft cystic lesions measuring a few millimeters   Portacath/PICC-without erythema  Lab Results:  Lab Results  Component Value Date   WBC 5.9 09/09/2015   HGB 13.7 09/09/2015   HCT 40.7 09/09/2015   MCV 104.3* 09/09/2015   PLT 163 09/09/2015   NEUTROABS 3.8 09/09/2015    Medications: I have reviewed the patient's current medications.  Assessment/Plan: 1. 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 Microsatellite stable; preserved expression of major and minor MMR proteins. He began treatment with FOLFOX in December 2011. He completed cycle #12 on 10/27/2010. Oxaliplatin was held with cycles number 7 through 10 and resumed with cycle #11. 2. History of neutropenia secondary to chemotherapy. 3. History of thrombocytopenia secondary chemotherapy. 4. He did not undergo a preoperative colonoscopy. He underwent a colonoscopy on 12/19/2010 with findings  of a 1 cm polyp at 90 cm from the anal verge, minimal polyp at 30 cm from the anal verge and minimal diverticulosis. Colonoscopy 03/18/2012-negative. 5. 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 He began study drug on 05/13/2010. 6. Status post Port-A-Cath placement. The Port-A-Cath has been removed. 7. 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 9. Low attenuation lesion in the caudate lobe of the liver on the CT 04/11/2012-? Cyst. MRI liver on 04/20/2012 favored the caudate lobe liver lesion to represent a cyst or complex cyst. CT abdomen/pelvis 04/17/2013 with continued enlargement of hypovascular lesion in the caudate lobe of the liver.  PET scan 10/27/2011 confirmed a hypermetabolic caudate lobe liver lesion, tiny indeterminate lung nodules, and a 9 mm level II left neck node   CT biopsy of the liver lesion 11/07/2013 confirmed adenocarcinoma  Left liver and caudate resection 01/15/2014-pathology confirmed metastatic colon cancer, and negative surgical margins  Surveillance CT scans 07/24/2014 consistent with recurrent disease involving upper abdominal lymph nodes, peritoneal implants, and an anterior abdominal wall mass  Status post biopsy of the anterior abdominal wall mass 08/06/2014 with pathology showing metastatic adenocarcinoma consistent with a colon primary.  UGT1A1 1/28  Cycle 1 FOLFIRI/Avastin on the [facility] 1317 08/20/2014  Cycle 2 FOLFIRI/Avastin 09/03/2014  Cycle 3 held on 09/17/2014 due to neutropenia  Cycle 3 FOLFIRI/Avastin 09/25/2014. Neulasta added beginning with cycle 3.  Cycle 4 FOLFIRI/Avastin 10/08/2014  Restaging CT scans 10/18/2014 with slight interval increase in the size of the soft tissue mass in the subcutaneous fat of the epigastric region. Underlying  peritoneal implants, probable focus  of residual disease along the resected margin of the liver and hepatoduodenal ligament lymphadenopathy all appeared slightly improved. No new sites of metastatic disease otherwise noted. New left lower lobe bronchopneumonia.  Cycle 5 FOLFIRI/Avastin 10/22/2014  Cycle 6 FOLFIRI/Avastin 11/06/2014  Cycle 7 FOLFIRI Avastin 11/19/2014  Cycle 8 FOLFIRI/Avastin 12/03/2014  CT 12/14/2014 with stable disease  Cycle 9 FOLFIRI/Avastin 12/17/2014  Cycle 10 FOLFIRI/Avastin 01/07/2015 (5-fluorouracil and leucovorin dose reduced)  Cycle 11 FOLFIRI/Avastin 01/21/2015  Cycle 12 FOLFIRI/Avastin 02/04/2015  CT 02/13/2015 with stable disease  Cycle 13 FOLFIRI/Avastin 02/25/2015-changed to an every three-week protocol, now off study  Avastin held with cycle 14 FOLFIRI on 03/25/2015 secondary to gingivitis  Restaging CTs 05/20/2015-slight increase in the size of the epigastric subcutaneous mass, stable peritoneal implants  FOLFIRI continued on a 3 week schedule  Avastin resumed 06/17/2015 10. Iron deposition within the liver noted on MRI 04/20/2012. The ferritin level was normal on 04/17/2013. Increased iron deposition noted on the left liver resection 01/15/2014 11. Pain/bleeding at the anus reported when here 04/24/2013. Status post evaluation by Dr. [name] with findings of an anal fissure. 12. Right face cystic lesion 13. Status post Port-A-Cath placement 08/14/2014 14. Delayed nausea following FOLFIRI, prophylactic Decadron added with cycle 2 with improvement. 15. Chest CT 10/18/2014 with new left lower lobe bronchopneumonia. Levaquin initiated. Resolved on CT 12/14/2014 16. Gum pain- mucositis?, Gingivitis?-Improved with discontinuation of Avastin 17. Pain/tenderness, mild erythema and full appearance over the maxillary sinuses. Maxillofacial CT 01/30/2015 with mild to moderate bilateral maxillary and sphenoid sinus inflammatory changes. Multifocal right maxillary dental periapical lucency.  Status post a right upper tooth extraction with resolution of the pain  Disposition:  Bryan Fields appears stable. No appreciable change in the abdominal wall mass. I cannot appreciate loosening of the teeth on exam today. I cannot relate this symptom to the FOLFIRI/Avastin. He will follow-up with his dentist if the loosening worsens.  Mr. Mcbrien will return for an office visit and chemotherapy in 3 weeks.  Betsy Coder, MD  09/09/2015  9:59 AM

## 2015-09-09 NOTE — Patient Instructions (Signed)
[facility] Discharge Instructions for Patients Receiving Chemotherapy  Today you received the following chemotherapy agents:  Avastin, Irinotecan, Leucovorin, and 5FU.  To help prevent nausea and vomiting after your treatment, we encourage you to take your nausea medication as prescribed.   If you develop nausea and vomiting that is not controlled by your nausea medication, call the clinic.   BELOW ARE SYMPTOMS THAT SHOULD BE REPORTED IMMEDIATELY:  *FEVER GREATER THAN 100.5 F  *CHILLS WITH OR WITHOUT FEVER  NAUSEA AND VOMITING THAT IS NOT CONTROLLED WITH YOUR NAUSEA MEDICATION  *UNUSUAL SHORTNESS OF BREATH  *UNUSUAL BRUISING OR BLEEDING  TENDERNESS IN MOUTH AND THROAT WITH OR WITHOUT PRESENCE OF ULCERS  *URINARY PROBLEMS  *BOWEL PROBLEMS  UNUSUAL RASH Items with * indicate a potential emergency and should be followed up as soon as possible.  Feel free to call the clinic you have any questions or concerns. The clinic phone number is [phone].  Please show the CHEMO ALERT CARD at check-in to the Emergency Department and triage nurse.   

## 2015-09-09 NOTE — Patient Instructions (Signed)

## 2015-09-09 NOTE — Telephone Encounter (Signed)
Gave and printed appt sched and avs for pt for April and May °

## 2015-09-11 ENCOUNTER — Ambulatory Visit: Payer: 59

## 2015-09-11 ENCOUNTER — Ambulatory Visit (HOSPITAL_BASED_OUTPATIENT_CLINIC_OR_DEPARTMENT_OTHER): Payer: 59

## 2015-09-11 ENCOUNTER — Telehealth: Payer: Self-pay | Admitting: *Deleted

## 2015-09-11 VITALS — BP 144/88 | HR 80 | Temp 98.2°F | Resp 18

## 2015-09-11 DIAGNOSIS — C787 Secondary malignant neoplasm of liver and intrahepatic bile duct: Secondary | ICD-10-CM

## 2015-09-11 DIAGNOSIS — Z5189 Encounter for other specified aftercare: Secondary | ICD-10-CM

## 2015-09-11 DIAGNOSIS — C182 Malignant neoplasm of ascending colon: Secondary | ICD-10-CM

## 2015-09-11 DIAGNOSIS — C189 Malignant neoplasm of colon, unspecified: Secondary | ICD-10-CM

## 2015-09-11 MED ORDER — HEPARIN SOD (PORK) LOCK FLUSH 100 UNIT/ML IV SOLN
500.0000 [IU] | Freq: Once | INTRAVENOUS | Status: AC | PRN
  Administered 2015-09-11: 500 [IU]
  Filled 2015-09-11: qty 5

## 2015-09-11 MED ORDER — SODIUM CHLORIDE 0.9 % IJ SOLN
10.0000 mL | INTRAMUSCULAR | Status: DC | PRN
  Administered 2015-09-11: 10 mL
  Filled 2015-09-11: qty 10

## 2015-09-11 MED ORDER — PEGFILGRASTIM INJECTION 6 MG/0.6ML ~~LOC~~
6.0000 mg | PREFILLED_SYRINGE | Freq: Once | SUBCUTANEOUS | Status: AC
  Administered 2015-09-11: 6 mg via SUBCUTANEOUS
  Filled 2015-09-11: qty 0.6

## 2015-09-11 NOTE — Progress Notes (Signed)
Neulasta injection given by flush nurse after home infusion pump disconnected. 

## 2015-09-11 NOTE — Patient Instructions (Signed)

## 2015-09-11 NOTE — Telephone Encounter (Signed)
Pt call received by Smiley Houseman RN at [phone] in infusion; pt c/o ambulatory 53fu pump beeping "high pressure".  Denies any kinks in the line or medication bag. Pt knows how to stop beeping, is scheduled to have pump d/c'd at 1230. Dr. Benay Spice made aware that patient will be in this morning for pump evaluation; pt states he has about 16 mL 29fu left  In infusion.

## 2015-09-11 NOTE — Telephone Encounter (Signed)
Patient called back to infusion room at 0825 stating "if I move my needle a little bit, it flows fine.  I am okay coming in at 1230; it seems to be positional". Pt knows to come on in if there is a change.

## 2015-09-29 ENCOUNTER — Other Ambulatory Visit: Payer: Self-pay | Admitting: Oncology

## 2015-09-30 ENCOUNTER — Other Ambulatory Visit: Payer: Self-pay

## 2015-09-30 ENCOUNTER — Ambulatory Visit (HOSPITAL_BASED_OUTPATIENT_CLINIC_OR_DEPARTMENT_OTHER): Payer: 59 | Admitting: Nurse Practitioner

## 2015-09-30 ENCOUNTER — Telehealth: Payer: Self-pay | Admitting: Oncology

## 2015-09-30 ENCOUNTER — Ambulatory Visit (HOSPITAL_BASED_OUTPATIENT_CLINIC_OR_DEPARTMENT_OTHER): Payer: 59

## 2015-09-30 ENCOUNTER — Other Ambulatory Visit (HOSPITAL_BASED_OUTPATIENT_CLINIC_OR_DEPARTMENT_OTHER): Payer: 59

## 2015-09-30 ENCOUNTER — Encounter: Payer: Self-pay | Admitting: *Deleted

## 2015-09-30 VITALS — BP 137/78 | HR 70 | Temp 98.2°F | Resp 18 | Ht 71.0 in | Wt 260.3 lb

## 2015-09-30 DIAGNOSIS — C787 Secondary malignant neoplasm of liver and intrahepatic bile duct: Secondary | ICD-10-CM

## 2015-09-30 DIAGNOSIS — C182 Malignant neoplasm of ascending colon: Secondary | ICD-10-CM

## 2015-09-30 DIAGNOSIS — Z5112 Encounter for antineoplastic immunotherapy: Secondary | ICD-10-CM | POA: Diagnosis not present

## 2015-09-30 DIAGNOSIS — C183 Malignant neoplasm of hepatic flexure: Secondary | ICD-10-CM

## 2015-09-30 DIAGNOSIS — R109 Unspecified abdominal pain: Secondary | ICD-10-CM

## 2015-09-30 DIAGNOSIS — Z452 Encounter for adjustment and management of vascular access device: Secondary | ICD-10-CM

## 2015-09-30 DIAGNOSIS — C189 Malignant neoplasm of colon, unspecified: Secondary | ICD-10-CM

## 2015-09-30 DIAGNOSIS — K068 Other specified disorders of gingiva and edentulous alveolar ridge: Secondary | ICD-10-CM

## 2015-09-30 DIAGNOSIS — Z95828 Presence of other vascular implants and grafts: Secondary | ICD-10-CM | POA: Insufficient documentation

## 2015-09-30 DIAGNOSIS — Z5111 Encounter for antineoplastic chemotherapy: Secondary | ICD-10-CM

## 2015-09-30 LAB — CBC WITH DIFFERENTIAL/PLATELET
BASO%: 0.6 % (ref 0.0–2.0)
Basophils Absolute: 0 10*3/uL (ref 0.0–0.1)
EOS ABS: 0.2 10*3/uL (ref 0.0–0.5)
EOS%: 3.9 % (ref 0.0–7.0)
HEMATOCRIT: 42.3 % (ref 38.4–49.9)
HEMOGLOBIN: 14.2 g/dL (ref 13.0–17.1)
LYMPH#: 1.3 10*3/uL (ref 0.9–3.3)
LYMPH%: 25.7 % (ref 14.0–49.0)
MCH: 35.2 pg — ABNORMAL HIGH (ref 27.2–33.4)
MCHC: 33.6 g/dL (ref 32.0–36.0)
MCV: 105 fL — AB (ref 79.3–98.0)
MONO#: 0.5 10*3/uL (ref 0.1–0.9)
MONO%: 10 % (ref 0.0–14.0)
NEUT%: 59.8 % (ref 39.0–75.0)
NEUTROS ABS: 2.9 10*3/uL (ref 1.5–6.5)
PLATELETS: 153 10*3/uL (ref 140–400)
RBC: 4.03 10*6/uL — AB (ref 4.20–5.82)
RDW: 14.2 % (ref 11.0–14.6)
WBC: 4.9 10*3/uL (ref 4.0–10.3)

## 2015-09-30 LAB — COMPREHENSIVE METABOLIC PANEL
ALBUMIN: 3.4 g/dL — AB (ref 3.5–5.0)
ALK PHOS: 50 U/L (ref 40–150)
ALT: 32 U/L (ref 0–55)
ANION GAP: 7 meq/L (ref 3–11)
AST: 25 U/L (ref 5–34)
BILIRUBIN TOTAL: 0.39 mg/dL (ref 0.20–1.20)
BUN: 13.4 mg/dL (ref 7.0–26.0)
CALCIUM: 8.9 mg/dL (ref 8.4–10.4)
CO2: 25 meq/L (ref 22–29)
CREATININE: 1.1 mg/dL (ref 0.7–1.3)
Chloride: 110 mEq/L — ABNORMAL HIGH (ref 98–109)
EGFR: 74 mL/min/{1.73_m2} — AB (ref >90)
Glucose: 110 mg/dl (ref 70–140)
Potassium: 4.2 mEq/L (ref 3.5–5.1)
Sodium: 143 mEq/L (ref 136–145)
TOTAL PROTEIN: 6 g/dL — AB (ref 6.4–8.3)

## 2015-09-30 LAB — UA PROTEIN, DIPSTICK - CHCC: Protein, ur: <30 mg/dL

## 2015-09-30 MED ORDER — PALONOSETRON HCL INJECTION 0.25 MG/5ML
INTRAVENOUS | Status: AC
Start: 2015-09-30 — End: 2015-09-30
  Filled 2015-09-30: qty 5

## 2015-09-30 MED ORDER — ATROPINE SULFATE 1 MG/ML IJ SOLN
0.5000 mg | Freq: Once | INTRAMUSCULAR | Status: AC | PRN
  Administered 2015-09-30: 0.5 mg via INTRAVENOUS

## 2015-09-30 MED ORDER — ALTEPLASE 2 MG IJ SOLR
2.0000 mg | Freq: Once | INTRAMUSCULAR | Status: AC | PRN
  Administered 2015-09-30: 2 mg
  Filled 2015-09-30: qty 2

## 2015-09-30 MED ORDER — SODIUM CHLORIDE 0.9 % IV SOLN
7.4000 mg/kg | Freq: Once | INTRAVENOUS | Status: AC
  Administered 2015-09-30: 800 mg via INTRAVENOUS
  Filled 2015-09-30: qty 32

## 2015-09-30 MED ORDER — SODIUM CHLORIDE 0.9 % IV SOLN
Freq: Once | INTRAVENOUS | Status: AC
  Administered 2015-09-30: 12:00:00 via INTRAVENOUS

## 2015-09-30 MED ORDER — ATROPINE SULFATE 1 MG/ML IJ SOLN
INTRAMUSCULAR | Status: AC
  Filled 2015-09-30: qty 1

## 2015-09-30 MED ORDER — FLUOROURACIL CHEMO INJECTION 2.5 GM/50ML
240.0000 mg/m2 | Freq: Once | INTRAVENOUS | Status: AC
  Administered 2015-09-30: 550 mg via INTRAVENOUS
  Filled 2015-09-30: qty 11

## 2015-09-30 MED ORDER — LEUCOVORIN CALCIUM INJECTION 350 MG
240.0000 mg/m2 | Freq: Once | INTRAVENOUS | Status: AC
  Administered 2015-09-30: 562 mg via INTRAVENOUS
  Filled 2015-09-30: qty 28.1

## 2015-09-30 MED ORDER — SODIUM CHLORIDE 0.9 % IV SOLN
1600.0000 mg/m2 | INTRAVENOUS | Status: DC
  Administered 2015-09-30: 3750 mg via INTRAVENOUS
  Filled 2015-09-30: qty 75

## 2015-09-30 MED ORDER — SODIUM CHLORIDE 0.9 % IV SOLN
Freq: Once | INTRAVENOUS | Status: AC
  Administered 2015-09-30: 11:00:00 via INTRAVENOUS
  Filled 2015-09-30: qty 5

## 2015-09-30 MED ORDER — DEXTROSE 5 % IV SOLN
180.0000 mg/m2 | Freq: Once | INTRAVENOUS | Status: AC
  Administered 2015-09-30: 422 mg via INTRAVENOUS
  Filled 2015-09-30: qty 6.67

## 2015-09-30 MED ORDER — PALONOSETRON HCL INJECTION 0.25 MG/5ML
0.2500 mg | Freq: Once | INTRAVENOUS | Status: AC
  Administered 2015-09-30: 0.25 mg via INTRAVENOUS

## 2015-09-30 NOTE — Progress Notes (Signed)
At 1025, pt's Port-A-Cath accessed. Site flushes well, no blood return noted. Site is nontender, no sweeling noted when flushed. Free flows to NS line. At 1035, cath flow instilled per policy, site marked with tape. At 1105, Port-A-Cath site has bright, brisk blood return noted. Site is non tender with no swelling noted. Cath Flow  Aspirated  From site, site flushes well.

## 2015-09-30 NOTE — Patient Instructions (Signed)
[facility] Discharge Instructions for Patients Receiving Chemotherapy  Today you received the following chemotherapy agents:  5FU, Irinotecan, and Leucovorin.  To help prevent nausea and vomiting after your treatment, we encourage you to take your nausea medication as prescribed.   If you develop nausea and vomiting that is not controlled by your nausea medication, call the clinic.   BELOW ARE SYMPTOMS THAT SHOULD BE REPORTED IMMEDIATELY:  *FEVER GREATER THAN 100.5 F  *CHILLS WITH OR WITHOUT FEVER  NAUSEA AND VOMITING THAT IS NOT CONTROLLED WITH YOUR NAUSEA MEDICATION  *UNUSUAL SHORTNESS OF BREATH  *UNUSUAL BRUISING OR BLEEDING  TENDERNESS IN MOUTH AND THROAT WITH OR WITHOUT PRESENCE OF ULCERS  *URINARY PROBLEMS  *BOWEL PROBLEMS  UNUSUAL RASH Items with * indicate a potential emergency and should be followed up as soon as possible.  Feel free to call the clinic you have any questions or concerns. The clinic phone number is [phone].  Please show the [place] at check-in to the Emergency Department and triage nurse.

## 2015-09-30 NOTE — Telephone Encounter (Signed)
Gave and printed appt sched and avs for pt for may and june °

## 2015-09-30 NOTE — Progress Notes (Signed)
[facility] OFFICE PROGRESS NOTE   Diagnosis:  Colon cancer  INTERVAL HISTORY:   Bryan Fields returns as scheduled. He completed another cycle of FOLFIRI/Avastin 09/09/2015. He had mild nausea. No vomiting. No diarrhea. He continues to have abdominal pain after chemotherapy. He notes one loose tooth. The others have "tightened". He continues to have intermittent gum bleeding. No shortness of breath or chest pain. No leg swelling or calf pain.   Objective:  Vital signs in last 24 hours:  Blood pressure 137/78, pulse 70, temperature 98.2 F (36.8 C), temperature source Oral, resp. rate 18, height '5\' 11"'$  (1.803 m), weight 260 lb 4.8 oz (118.071 kg), SpO2 100 %.    HEENT: No thrush or ulcers. Resp: Lungs clear bilaterally. Cardio: Regular rate and rhythm. GI: No hepatomegaly. Vascular: No leg edema. Skin: Anterior upper abdominal wall mass measures approximate 6 x 6 cm. Superficial soft cystic lesion measuring less than 1 cm located at the lower right side of the primary mass.  Port-A-Cath without erythema.  Lab Results:  Lab Results  Component Value Date   WBC 4.9 09/30/2015   HGB 14.2 09/30/2015   HCT 42.3 09/30/2015   MCV 105.0* 09/30/2015   PLT 153 09/30/2015   NEUTROABS 2.9 09/30/2015    Imaging:  No results found.  Medications: I have reviewed the patient's current medications.  Assessment/Plan: 1. 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 Microsatellite stable; preserved expression of major and minor MMR proteins. He began treatment with FOLFOX in December 2011. He completed cycle #12 on 10/27/2010. Oxaliplatin was held with cycles number 7 through 10 and resumed with cycle #11. 2. History of neutropenia secondary to chemotherapy. 3. History of thrombocytopenia secondary chemotherapy. 4. He did not undergo a preoperative colonoscopy. He  underwent a colonoscopy on 12/19/2010 with findings of a 1 cm polyp at 90 cm from the anal verge, minimal polyp at 30 cm from the anal verge and minimal diverticulosis. Colonoscopy 03/18/2012-negative. 5. 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 He began study drug on 05/13/2010. 6. Status post Port-A-Cath placement. The Port-A-Cath has been removed. 7. 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 9. Low attenuation lesion in the caudate lobe of the liver on the CT 04/11/2012-? Cyst. MRI liver on 04/20/2012 favored the caudate lobe liver lesion to represent a cyst or complex cyst. CT abdomen/pelvis 04/17/2013 with continued enlargement of hypovascular lesion in the caudate lobe of the liver.  PET scan 10/27/2011 confirmed a hypermetabolic caudate lobe liver lesion, tiny indeterminate lung nodules, and a 9 mm level II left neck node   CT biopsy of the liver lesion 11/07/2013 confirmed adenocarcinoma  Left liver and caudate resection 01/15/2014-pathology confirmed metastatic colon cancer, and negative surgical margins  Surveillance CT scans 07/24/2014 consistent with recurrent disease involving upper abdominal lymph nodes, peritoneal implants, and an anterior abdominal wall mass  Status post biopsy of the anterior abdominal wall mass 08/06/2014 with pathology showing metastatic adenocarcinoma consistent with a colon primary.  UGT1A1 1/28  Cycle 1 FOLFIRI/Avastin on the [facility] 1317 08/20/2014  Cycle 2 FOLFIRI/Avastin 09/03/2014  Cycle 3 held on 09/17/2014 due to neutropenia  Cycle 3 FOLFIRI/Avastin 09/25/2014. Neulasta added beginning with cycle 3.  Cycle 4 FOLFIRI/Avastin 10/08/2014  Restaging CT scans 10/18/2014 with slight interval increase in  the size of the soft tissue mass in the subcutaneous fat of the epigastric  region. Underlying peritoneal implants, probable focus of residual disease along the resected margin of the liver and hepatoduodenal ligament lymphadenopathy all appeared slightly improved. No new sites of metastatic disease otherwise noted. New left lower lobe bronchopneumonia.  Cycle 5 FOLFIRI/Avastin 10/22/2014  Cycle 6 FOLFIRI/Avastin 11/06/2014  Cycle 7 FOLFIRI Avastin 11/19/2014  Cycle 8 FOLFIRI/Avastin 12/03/2014  CT 12/14/2014 with stable disease  Cycle 9 FOLFIRI/Avastin 12/17/2014  Cycle 10 FOLFIRI/Avastin 01/07/2015 (5-fluorouracil and leucovorin dose reduced)  Cycle 11 FOLFIRI/Avastin 01/21/2015  Cycle 12 FOLFIRI/Avastin 02/04/2015  CT 02/13/2015 with stable disease  Cycle 13 FOLFIRI/Avastin 02/25/2015-changed to an every three-week protocol, now off study  Avastin held with cycle 14 FOLFIRI on 03/25/2015 secondary to gingivitis  Restaging CTs 05/20/2015-slight increase in the size of the epigastric subcutaneous mass, stable peritoneal implants  FOLFIRI continued on a 3 week schedule  Avastin resumed 06/17/2015 10. Iron deposition within the liver noted on MRI 04/20/2012. The ferritin level was normal on 04/17/2013. Increased iron deposition noted on the left liver resection 01/15/2014 11. Pain/bleeding at the anus reported when here 04/24/2013. Status post evaluation by Dr. [name] with findings of an anal fissure. 12. Right face cystic lesion 13. Status post Port-A-Cath placement 08/14/2014 14. Delayed nausea following FOLFIRI, prophylactic Decadron added with cycle 2 with improvement. 15. Chest CT 10/18/2014 with new left lower lobe bronchopneumonia. Levaquin initiated. Resolved on CT 12/14/2014 16. Gum pain- mucositis?, Gingivitis?-Improved with discontinuation of Avastin 17. Pain/tenderness, mild erythema and full appearance over the maxillary sinuses. Maxillofacial CT 01/30/2015 with mild to moderate bilateral maxillary and sphenoid sinus inflammatory changes.  Multifocal right maxillary dental periapical lucency. Status post a right upper tooth extraction with resolution of the pain.    Disposition: Mr. Flye appears stable. The plan is to continue FOLFIRI/Avastin every 3 weeks. I discussed his complaint of "loose teeth" with pharmacy. They will look into the possibility of an association with his treatment.  He will return for a follow-up visit in 3 weeks. He will contact the office in the interim with any problems.  Plan reviewed with Dr. Benay Spice.    Ned Card ANP/GNP-BC   09/30/2015  10:05 AM

## 2015-09-30 NOTE — Progress Notes (Signed)
  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 Still has some abdominal pain after each treatment that lasts about a week despite the Atropine. Rarely needs to take pain medication. Main concern is his teeth being loose in front (lower teeth). Has to cut food up to put in back so he can chew.          Support Groups/Services: GI Support Group;Other (09/30/15 1339)  Provided him and wife [employer] Support Calendar for May w/emphesis on GI Group and Living With Cancer group for him.           Time Spent with Patient: 15 (09/30/15 1339)

## 2015-10-01 ENCOUNTER — Telehealth: Payer: Self-pay | Admitting: *Deleted

## 2015-10-01 LAB — CEA: CEA1: 3.9 ng/mL (ref 0.0–4.7)

## 2015-10-01 LAB — CEA (PARALLEL TESTING): CEA: 2.1 ng/mL

## 2015-10-01 NOTE — Telephone Encounter (Signed)
-----   Message from Ladell Pier, MD sent at 10/01/2015  1:49 PM EDT ----- Please call patient, Bryan Fields is stable

## 2015-10-01 NOTE — Telephone Encounter (Signed)
Per Dr. Benay Spice, pt notified that cea is stable.  Pt concerned about correlation between his loose teeth and chemo drugs that he is receiving. Pt informed that L. Thomas NP verified with pharmacy that there is no correlation between loose teeth and his chemo drugs.  Per Dr. Benay Spice, he recommends that pt continue with his current chemo treatment.  Pt verbalized an understanding of information and is appreciative of information.

## 2015-10-02 ENCOUNTER — Ambulatory Visit: Payer: 59

## 2015-10-02 ENCOUNTER — Ambulatory Visit (HOSPITAL_BASED_OUTPATIENT_CLINIC_OR_DEPARTMENT_OTHER): Payer: 59

## 2015-10-02 VITALS — BP 151/84 | HR 76 | Temp 97.8°F | Resp 18

## 2015-10-02 DIAGNOSIS — C189 Malignant neoplasm of colon, unspecified: Secondary | ICD-10-CM

## 2015-10-02 DIAGNOSIS — Z5189 Encounter for other specified aftercare: Secondary | ICD-10-CM

## 2015-10-02 DIAGNOSIS — C182 Malignant neoplasm of ascending colon: Secondary | ICD-10-CM | POA: Diagnosis not present

## 2015-10-02 DIAGNOSIS — C787 Secondary malignant neoplasm of liver and intrahepatic bile duct: Secondary | ICD-10-CM

## 2015-10-02 MED ORDER — PEGFILGRASTIM INJECTION 6 MG/0.6ML ~~LOC~~
6.0000 mg | PREFILLED_SYRINGE | Freq: Once | SUBCUTANEOUS | Status: AC
  Administered 2015-10-02: 6 mg via SUBCUTANEOUS
  Filled 2015-10-02: qty 0.6

## 2015-10-02 MED ORDER — HEPARIN SOD (PORK) LOCK FLUSH 100 UNIT/ML IV SOLN
500.0000 [IU] | Freq: Once | INTRAVENOUS | Status: AC | PRN
  Administered 2015-10-02: 500 [IU]
  Filled 2015-10-02: qty 5

## 2015-10-02 MED ORDER — SODIUM CHLORIDE 0.9 % IJ SOLN
10.0000 mL | INTRAMUSCULAR | Status: DC | PRN
  Administered 2015-10-02: 10 mL
  Filled 2015-10-02: qty 10

## 2015-10-02 NOTE — Patient Instructions (Signed)

## 2015-10-02 NOTE — Progress Notes (Signed)
Neulasta injection given by infusion nurse after home infusion pump disconnected 

## 2015-10-20 ENCOUNTER — Other Ambulatory Visit: Payer: Self-pay | Admitting: Oncology

## 2015-10-21 ENCOUNTER — Telehealth: Payer: Self-pay | Admitting: Nurse Practitioner

## 2015-10-21 ENCOUNTER — Telehealth: Payer: Self-pay | Admitting: *Deleted

## 2015-10-21 ENCOUNTER — Ambulatory Visit (HOSPITAL_BASED_OUTPATIENT_CLINIC_OR_DEPARTMENT_OTHER): Payer: 59 | Admitting: Nurse Practitioner

## 2015-10-21 ENCOUNTER — Other Ambulatory Visit (HOSPITAL_BASED_OUTPATIENT_CLINIC_OR_DEPARTMENT_OTHER): Payer: 59

## 2015-10-21 ENCOUNTER — Ambulatory Visit (HOSPITAL_BASED_OUTPATIENT_CLINIC_OR_DEPARTMENT_OTHER): Payer: 59

## 2015-10-21 ENCOUNTER — Ambulatory Visit: Payer: 59

## 2015-10-21 VITALS — BP 127/82 | HR 66 | Temp 98.4°F | Resp 18 | Ht 71.0 in | Wt 254.9 lb

## 2015-10-21 VITALS — BP 124/73 | HR 63

## 2015-10-21 DIAGNOSIS — C189 Malignant neoplasm of colon, unspecified: Secondary | ICD-10-CM

## 2015-10-21 DIAGNOSIS — C182 Malignant neoplasm of ascending colon: Secondary | ICD-10-CM

## 2015-10-21 DIAGNOSIS — Z452 Encounter for adjustment and management of vascular access device: Secondary | ICD-10-CM

## 2015-10-21 DIAGNOSIS — Z5112 Encounter for antineoplastic immunotherapy: Secondary | ICD-10-CM | POA: Diagnosis not present

## 2015-10-21 DIAGNOSIS — C183 Malignant neoplasm of hepatic flexure: Secondary | ICD-10-CM

## 2015-10-21 DIAGNOSIS — Z95828 Presence of other vascular implants and grafts: Secondary | ICD-10-CM

## 2015-10-21 DIAGNOSIS — C787 Secondary malignant neoplasm of liver and intrahepatic bile duct: Secondary | ICD-10-CM

## 2015-10-21 DIAGNOSIS — Z5111 Encounter for antineoplastic chemotherapy: Secondary | ICD-10-CM

## 2015-10-21 DIAGNOSIS — Z807 Family history of other malignant neoplasms of lymphoid, hematopoietic and related tissues: Secondary | ICD-10-CM

## 2015-10-21 DIAGNOSIS — R109 Unspecified abdominal pain: Secondary | ICD-10-CM | POA: Diagnosis not present

## 2015-10-21 DIAGNOSIS — R197 Diarrhea, unspecified: Secondary | ICD-10-CM

## 2015-10-21 LAB — COMPREHENSIVE METABOLIC PANEL
ALT: 35 U/L (ref 0–55)
ANION GAP: 6 meq/L (ref 3–11)
AST: 24 U/L (ref 5–34)
Albumin: 3.6 g/dL (ref 3.5–5.0)
Alkaline Phosphatase: 52 U/L (ref 40–150)
BUN: 19.8 mg/dL (ref 7.0–26.0)
CHLORIDE: 110 meq/L — AB (ref 98–109)
CO2: 23 meq/L (ref 22–29)
Calcium: 9.1 mg/dL (ref 8.4–10.4)
Creatinine: 1.1 mg/dL (ref 0.7–1.3)
EGFR: 77 mL/min/{1.73_m2} — AB (ref >90)
GLUCOSE: 123 mg/dL (ref 70–140)
Potassium: 4.4 mEq/L (ref 3.5–5.1)
SODIUM: 139 meq/L (ref 136–145)
Total Bilirubin: 0.5 mg/dL (ref 0.20–1.20)
Total Protein: 6.5 g/dL (ref 6.4–8.3)

## 2015-10-21 LAB — CBC WITH DIFFERENTIAL/PLATELET
BASO%: 0.9 % (ref 0.0–2.0)
Basophils Absolute: 0.1 10*3/uL (ref 0.0–0.1)
EOS%: 1.9 % (ref 0.0–7.0)
Eosinophils Absolute: 0.2 10*3/uL (ref 0.0–0.5)
HCT: 43.5 % (ref 38.4–49.9)
HGB: 14.5 g/dL (ref 13.0–17.1)
LYMPH%: 17 % (ref 14.0–49.0)
MCH: 34.5 pg — ABNORMAL HIGH (ref 27.2–33.4)
MCHC: 33.2 g/dL (ref 32.0–36.0)
MCV: 103.9 fL — AB (ref 79.3–98.0)
MONO#: 0.8 10*3/uL (ref 0.1–0.9)
MONO%: 7.4 % (ref 0.0–14.0)
NEUT#: 7.6 10*3/uL — ABNORMAL HIGH (ref 1.5–6.5)
NEUT%: 72.8 % (ref 39.0–75.0)
PLATELETS: 159 10*3/uL (ref 140–400)
RBC: 4.19 10*6/uL — AB (ref 4.20–5.82)
RDW: 14.5 % (ref 11.0–14.6)
WBC: 10.4 10*3/uL — AB (ref 4.0–10.3)
lymph#: 1.8 10*3/uL (ref 0.9–3.3)

## 2015-10-21 LAB — UA PROTEIN, DIPSTICK - CHCC: Protein, ur: <30 mg/dL

## 2015-10-21 MED ORDER — ATROPINE SULFATE 1 MG/ML IJ SOLN
INTRAMUSCULAR | Status: AC
  Filled 2015-10-21: qty 1

## 2015-10-21 MED ORDER — SODIUM CHLORIDE 0.9 % IV SOLN
Freq: Once | INTRAVENOUS | Status: AC
  Administered 2015-10-21: 12:00:00 via INTRAVENOUS
  Filled 2015-10-21: qty 5

## 2015-10-21 MED ORDER — ALTEPLASE 2 MG IJ SOLR
2.0000 mg | Freq: Once | INTRAMUSCULAR | Status: AC | PRN
  Administered 2015-10-21: 2 mg
  Filled 2015-10-21: qty 2

## 2015-10-21 MED ORDER — SODIUM CHLORIDE 0.9 % IJ SOLN
10.0000 mL | INTRAMUSCULAR | Status: DC | PRN
  Filled 2015-10-21: qty 10

## 2015-10-21 MED ORDER — PALONOSETRON HCL INJECTION 0.25 MG/5ML
INTRAVENOUS | Status: AC
  Filled 2015-10-21: qty 5

## 2015-10-21 MED ORDER — LEUCOVORIN CALCIUM INJECTION 350 MG
240.0000 mg/m2 | Freq: Once | INTRAVENOUS | Status: AC
  Administered 2015-10-21: 562 mg via INTRAVENOUS
  Filled 2015-10-21: qty 28.1

## 2015-10-21 MED ORDER — ATROPINE SULFATE 1 MG/ML IJ SOLN
0.5000 mg | Freq: Once | INTRAMUSCULAR | Status: AC | PRN
  Administered 2015-10-21: 0.5 mg via INTRAVENOUS

## 2015-10-21 MED ORDER — IRINOTECAN HCL CHEMO INJECTION 100 MG/5ML
180.0000 mg/m2 | Freq: Once | INTRAVENOUS | Status: AC
  Administered 2015-10-21: 422 mg via INTRAVENOUS
  Filled 2015-10-21: qty 6.67

## 2015-10-21 MED ORDER — SODIUM CHLORIDE 0.9 % IV SOLN
Freq: Once | INTRAVENOUS | Status: AC
  Administered 2015-10-21: 12:00:00 via INTRAVENOUS

## 2015-10-21 MED ORDER — HEPARIN SOD (PORK) LOCK FLUSH 100 UNIT/ML IV SOLN
500.0000 [IU] | Freq: Once | INTRAVENOUS | Status: DC | PRN
  Filled 2015-10-21: qty 5

## 2015-10-21 MED ORDER — SODIUM CHLORIDE 0.9 % IV SOLN
1600.0000 mg/m2 | INTRAVENOUS | Status: DC
  Administered 2015-10-21: 3750 mg via INTRAVENOUS
  Filled 2015-10-21: qty 75

## 2015-10-21 MED ORDER — SODIUM CHLORIDE 0.9 % IV SOLN
7.4000 mg/kg | Freq: Once | INTRAVENOUS | Status: AC
  Administered 2015-10-21: 800 mg via INTRAVENOUS
  Filled 2015-10-21: qty 32

## 2015-10-21 MED ORDER — HEPARIN SOD (PORK) LOCK FLUSH 100 UNIT/ML IV SOLN
500.0000 [IU] | Freq: Once | INTRAVENOUS | Status: DC | PRN
Start: 2015-10-21 — End: 2015-10-21
  Filled 2015-10-21: qty 5

## 2015-10-21 MED ORDER — OXYCODONE-ACETAMINOPHEN 5-325 MG PO TABS: 1.0000 | ORAL_TABLET | Freq: Four times a day (QID) | ORAL | Status: DC | PRN

## 2015-10-21 MED ORDER — SODIUM CHLORIDE 0.9 % IJ SOLN
10.0000 mL | INTRAMUSCULAR | Status: DC | PRN
  Administered 2015-10-21: 10 mL via INTRAVENOUS
  Filled 2015-10-21: qty 10

## 2015-10-21 MED ORDER — PALONOSETRON HCL INJECTION 0.25 MG/5ML
0.2500 mg | Freq: Once | INTRAVENOUS | Status: AC
  Administered 2015-10-21: 0.25 mg via INTRAVENOUS

## 2015-10-21 MED ORDER — FLUOROURACIL CHEMO INJECTION 2.5 GM/50ML
240.0000 mg/m2 | Freq: Once | INTRAVENOUS | Status: AC
  Administered 2015-10-21: 550 mg via INTRAVENOUS
  Filled 2015-10-21: qty 11

## 2015-10-21 MED ORDER — SODIUM CHLORIDE 0.9 % IV SOLN: Freq: Once | INTRAVENOUS | Status: DC

## 2015-10-21 NOTE — Progress Notes (Signed)
Patient has TPA running d/t no blood return during flush visit. Checked for blood return at 1015 with no success. Patient sent back to lab for labs to be drawn peripherally. Called and informed infusion room.

## 2015-10-21 NOTE — Progress Notes (Signed)
[facility] OFFICE PROGRESS NOTE   Diagnosis:  Colon cancer  INTERVAL HISTORY:   Bryan Fields returns as scheduled. He completed another cycle of FOLFIRI/Avastin 09/30/2015. He denies nausea/vomiting. Stable mouth sore. He noted increased diarrhea and abdominal pain following the most recent cycle. He took Imodium with good relief. He continues to have periodic nosebleeds and gum bleeding. No other bleeding. No shortness of breath or chest pain. No leg swelling or calf pain. No change in "loose teeth".  He reports his daughter with recently diagnosed with melanoma and will be undergoing an axillary lymph node dissection in the near future.  Objective:  Vital signs in last 24 hours:  Blood pressure 127/82, pulse 66, temperature 98.4 F (36.9 C), temperature source Oral, resp. rate 18, height '5\' 11"'$  (1.803 m), weight 245 lb 14.4 oz (111.54 kg), SpO2 98 %.    HEENT: No thrush or ulcers. Erythema along gumline. Resp: Lungs clear bilaterally. Cardio: Regular rate and rhythm. GI: No hepatomegaly. Vascular: No leg edema. Skin: Anterior upper abdominal wall mass measures approximately 6 x 6 cm. Superficial soft cystic lesion measuring approximately 1 cm located at the lower right side of the primary mass. Port-A-Cath without erythema.    Lab Results:  Lab Results  Component Value Date   WBC 4.9 09/30/2015   HGB 14.2 09/30/2015   HCT 42.3 09/30/2015   MCV 105.0* 09/30/2015   PLT 153 09/30/2015   NEUTROABS 2.9 09/30/2015    Imaging:  No results found.  Medications: I have reviewed the patient's current medications.  Assessment/Plan: 1. 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 Microsatellite stable; preserved expression of major and minor MMR proteins. He began treatment with FOLFOX in December 2011. He completed cycle #12 on 10/27/2010. Oxaliplatin was  held with cycles number 7 through 10 and resumed with cycle #11. 2. History of neutropenia secondary to chemotherapy. 3. History of thrombocytopenia secondary chemotherapy. 4. He did not undergo a preoperative colonoscopy. He underwent a colonoscopy on 12/19/2010 with findings of a 1 cm polyp at 90 cm from the anal verge, minimal polyp at 30 cm from the anal verge and minimal diverticulosis. Colonoscopy 03/18/2012-negative. 5. 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 He began study drug on 05/13/2010. 6. Status post Port-A-Cath placement. The Port-A-Cath has been removed. 7. 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 9. Low attenuation lesion in the caudate lobe of the liver on the CT 04/11/2012-? Cyst. MRI liver on 04/20/2012 favored the caudate lobe liver lesion to represent a cyst or complex cyst. CT abdomen/pelvis 04/17/2013 with continued enlargement of hypovascular lesion in the caudate lobe of the liver.  PET scan 10/27/2011 confirmed a hypermetabolic caudate lobe liver lesion, tiny indeterminate lung nodules, and a 9 mm level II left neck node   CT biopsy of the liver lesion 11/07/2013 confirmed adenocarcinoma  Left liver and caudate resection 01/15/2014-pathology confirmed metastatic colon cancer, and negative surgical margins  Surveillance CT scans 07/24/2014 consistent with recurrent disease involving upper abdominal lymph nodes, peritoneal implants, and an anterior abdominal wall mass  Status post biopsy of the anterior abdominal wall mass 08/06/2014 with pathology showing metastatic adenocarcinoma consistent with a colon primary.  UGT1A1 1/28  Cycle 1 FOLFIRI/Avastin on the [facility] 1317 08/20/2014  Cycle 2 FOLFIRI/Avastin 09/03/2014  Cycle 3 held on 09/17/2014 due to neutropenia  Cycle 3 FOLFIRI/Avastin  09/25/2014. Neulasta added beginning with cycle 3.  Cycle 4 FOLFIRI/Avastin 10/08/2014  Restaging CT scans 10/18/2014 with slight interval increase in the size of the soft tissue mass in the subcutaneous fat of the epigastric region. Underlying peritoneal implants, probable focus of residual disease along the resected margin of the liver and hepatoduodenal ligament lymphadenopathy all appeared slightly improved. No new sites of metastatic disease otherwise noted. New left lower lobe bronchopneumonia.  Cycle 5 FOLFIRI/Avastin 10/22/2014  Cycle 6 FOLFIRI/Avastin 11/06/2014  Cycle 7 FOLFIRI Avastin 11/19/2014  Cycle 8 FOLFIRI/Avastin 12/03/2014  CT 12/14/2014 with stable disease  Cycle 9 FOLFIRI/Avastin 12/17/2014  Cycle 10 FOLFIRI/Avastin 01/07/2015 (5-fluorouracil and leucovorin dose reduced)  Cycle 11 FOLFIRI/Avastin 01/21/2015  Cycle 12 FOLFIRI/Avastin 02/04/2015  CT 02/13/2015 with stable disease  Cycle 13 FOLFIRI/Avastin 02/25/2015-changed to an every three-week protocol, now off study  Avastin held with cycle 14 FOLFIRI on 03/25/2015 secondary to gingivitis  Restaging CTs 05/20/2015-slight increase in the size of the epigastric subcutaneous mass, stable peritoneal implants  FOLFIRI continued on a 3 week schedule  Avastin resumed 06/17/2015 10. Iron deposition within the liver noted on MRI 04/20/2012. The ferritin level was normal on 04/17/2013. Increased iron deposition noted on the left liver resection 01/15/2014 11. Pain/bleeding at the anus reported when here 04/24/2013. Status post evaluation by Dr. [name] with findings of an anal fissure. 12. Right face cystic lesion 13. Status post Port-A-Cath placement 08/14/2014 14. Delayed nausea following FOLFIRI, prophylactic Decadron added with cycle 2 with improvement. 15. Chest CT 10/18/2014 with new left lower lobe bronchopneumonia. Levaquin initiated. Resolved on CT 12/14/2014 16. Gum pain- mucositis?, Gingivitis?-Improved  with discontinuation of Avastin 17. Pain/tenderness, mild erythema and full appearance over the maxillary sinuses. Maxillofacial CT 01/30/2015 with mild to moderate bilateral maxillary and sphenoid sinus inflammatory changes. Multifocal right maxillary dental periapical lucency. Status post a right upper tooth extraction with resolution of the pain.   Disposition: Bryan Fields appears stable. Plan to continue every three-week FOLFIRI/Avastin.  He had increased diarrhea and abdominal pain following the most recent cycle. He understands to contact the office if the diarrhea is not controlled with Imodium. For the abdominal pain I gave him a prescription for Percocet 5/325 one every 6 hours as needed.  He will return for a follow-up visit and the next cycle of treatment in 3 weeks. He will contact the office in the interim as outlined above or with any other problems.  Ned Card ANP/GNP-BC   10/21/2015  10:16 AM

## 2015-10-21 NOTE — Telephone Encounter (Signed)
Per staff message and POF I have scheduled appts. Advised scheduler of appts. JMW  

## 2015-10-21 NOTE — Progress Notes (Signed)
Port-A-Cath  Accessed, no blood return noted. Site flushes well with no pain noted, no swelling noted.

## 2015-10-21 NOTE — Patient Instructions (Signed)
[place] Discharge Instructions for Patients Receiving Chemotherapy  Today you received the following chemotherapy agents: Avastin, 5-fu, irinotecan.   To help prevent nausea and vomiting after your treatment, we encourage you to take your nausea medication:as needed.    If you develop nausea and vomiting that is not controlled by your nausea medication, call the clinic.   BELOW ARE SYMPTOMS THAT SHOULD BE REPORTED IMMEDIATELY:  *FEVER GREATER THAN 100.5 F  *CHILLS WITH OR WITHOUT FEVER  NAUSEA AND VOMITING THAT IS NOT CONTROLLED WITH YOUR NAUSEA MEDICATION  *UNUSUAL SHORTNESS OF BREATH  *UNUSUAL BRUISING OR BLEEDING  TENDERNESS IN MOUTH AND THROAT WITH OR WITHOUT PRESENCE OF ULCERS  *URINARY PROBLEMS  *BOWEL PROBLEMS  UNUSUAL RASH Items with * indicate a potential emergency and should be followed up as soon as possible.  Feel free to call the clinic you have any questions or concerns. The clinic phone number is [phone].  Please show the [place] at check-in to the Emergency Department and triage nurse.

## 2015-10-21 NOTE — Telephone Encounter (Signed)
per pof to sch pt appt-sent MW email to sch pt trmt-pt to get updated copy of avs °

## 2015-10-21 NOTE — Progress Notes (Signed)
Blood return noted at 1115. 7cc of blood wasted.

## 2015-10-21 NOTE — Progress Notes (Signed)
1430- Pt AVS printed with future appointments. Pt to come back for disconnect on Wed. Around 1230pm.

## 2015-10-23 ENCOUNTER — Ambulatory Visit: Payer: 59

## 2015-10-23 ENCOUNTER — Ambulatory Visit (HOSPITAL_BASED_OUTPATIENT_CLINIC_OR_DEPARTMENT_OTHER): Payer: 59

## 2015-10-23 VITALS — BP 137/79 | HR 79 | Temp 98.3°F

## 2015-10-23 DIAGNOSIS — C189 Malignant neoplasm of colon, unspecified: Secondary | ICD-10-CM | POA: Diagnosis not present

## 2015-10-23 DIAGNOSIS — Z5189 Encounter for other specified aftercare: Secondary | ICD-10-CM | POA: Diagnosis not present

## 2015-10-23 MED ORDER — PEGFILGRASTIM INJECTION 6 MG/0.6ML ~~LOC~~
6.0000 mg | PREFILLED_SYRINGE | Freq: Once | SUBCUTANEOUS | Status: AC
  Administered 2015-10-23: 6 mg via SUBCUTANEOUS
  Filled 2015-10-23: qty 0.6

## 2015-10-23 MED ORDER — SODIUM CHLORIDE 0.9 % IJ SOLN
10.0000 mL | INTRAMUSCULAR | Status: DC | PRN
  Administered 2015-10-23: 10 mL
  Filled 2015-10-23: qty 10

## 2015-10-23 MED ORDER — HEPARIN SOD (PORK) LOCK FLUSH 100 UNIT/ML IV SOLN
500.0000 [IU] | Freq: Once | INTRAVENOUS | Status: AC | PRN
  Administered 2015-10-23: 500 [IU]
  Filled 2015-10-23: qty 5

## 2015-10-23 NOTE — Progress Notes (Signed)
Pump disconnect done by Beverlee Nims, injection nurse.

## 2015-11-04 ENCOUNTER — Other Ambulatory Visit: Payer: Self-pay | Admitting: Oncology

## 2015-11-11 ENCOUNTER — Ambulatory Visit (HOSPITAL_BASED_OUTPATIENT_CLINIC_OR_DEPARTMENT_OTHER): Payer: 59

## 2015-11-11 ENCOUNTER — Ambulatory Visit (HOSPITAL_BASED_OUTPATIENT_CLINIC_OR_DEPARTMENT_OTHER): Payer: 59 | Admitting: Oncology

## 2015-11-11 ENCOUNTER — Ambulatory Visit: Payer: 59

## 2015-11-11 ENCOUNTER — Telehealth: Payer: Self-pay | Admitting: Oncology

## 2015-11-11 ENCOUNTER — Other Ambulatory Visit (HOSPITAL_BASED_OUTPATIENT_CLINIC_OR_DEPARTMENT_OTHER): Payer: 59

## 2015-11-11 VITALS — BP 116/79 | HR 60

## 2015-11-11 VITALS — BP 132/67 | HR 80 | Temp 98.2°F | Resp 18 | Ht 71.0 in | Wt 256.2 lb

## 2015-11-11 DIAGNOSIS — C787 Secondary malignant neoplasm of liver and intrahepatic bile duct: Secondary | ICD-10-CM

## 2015-11-11 DIAGNOSIS — C182 Malignant neoplasm of ascending colon: Secondary | ICD-10-CM

## 2015-11-11 DIAGNOSIS — Z5111 Encounter for antineoplastic chemotherapy: Secondary | ICD-10-CM

## 2015-11-11 DIAGNOSIS — Z5112 Encounter for antineoplastic immunotherapy: Secondary | ICD-10-CM

## 2015-11-11 DIAGNOSIS — Z95828 Presence of other vascular implants and grafts: Secondary | ICD-10-CM

## 2015-11-11 DIAGNOSIS — C189 Malignant neoplasm of colon, unspecified: Secondary | ICD-10-CM

## 2015-11-11 DIAGNOSIS — K055 Other periodontal diseases: Secondary | ICD-10-CM | POA: Diagnosis not present

## 2015-11-11 LAB — CBC WITH DIFFERENTIAL/PLATELET
BASO%: 0.9 % (ref 0.0–2.0)
BASOS ABS: 0.1 10*3/uL (ref 0.0–0.1)
EOS ABS: 0.1 10*3/uL (ref 0.0–0.5)
EOS%: 2 % (ref 0.0–7.0)
HEMATOCRIT: 40.5 % (ref 38.4–49.9)
HEMOGLOBIN: 13.7 g/dL (ref 13.0–17.1)
LYMPH%: 17.7 % (ref 14.0–49.0)
MCH: 35.2 pg — AB (ref 27.2–33.4)
MCHC: 33.8 g/dL (ref 32.0–36.0)
MCV: 103.9 fL — AB (ref 79.3–98.0)
MONO#: 0.6 10*3/uL (ref 0.1–0.9)
MONO%: 8.5 % (ref 0.0–14.0)
NEUT#: 5.3 10*3/uL (ref 1.5–6.5)
NEUT%: 70.9 % (ref 39.0–75.0)
Platelets: 125 10*3/uL — ABNORMAL LOW (ref 140–400)
RBC: 3.9 10*6/uL — ABNORMAL LOW (ref 4.20–5.82)
RDW: 15 % — AB (ref 11.0–14.6)
WBC: 7.4 10*3/uL (ref 4.0–10.3)
lymph#: 1.3 10*3/uL (ref 0.9–3.3)

## 2015-11-11 LAB — COMPREHENSIVE METABOLIC PANEL
ALBUMIN: 3.3 g/dL — AB (ref 3.5–5.0)
ALK PHOS: 48 U/L (ref 40–150)
ALT: 45 U/L (ref 0–55)
AST: 33 U/L (ref 5–34)
Anion Gap: 8 mEq/L (ref 3–11)
BUN: 19.1 mg/dL (ref 7.0–26.0)
CALCIUM: 8.7 mg/dL (ref 8.4–10.4)
CO2: 24 mEq/L (ref 22–29)
Chloride: 108 mEq/L (ref 98–109)
Creatinine: 1.1 mg/dL (ref 0.7–1.3)
EGFR: 77 mL/min/{1.73_m2} — AB (ref >90)
Glucose: 155 mg/dl — ABNORMAL HIGH (ref 70–140)
POTASSIUM: 4.4 meq/L (ref 3.5–5.1)
SODIUM: 140 meq/L (ref 136–145)
Total Bilirubin: 0.5 mg/dL (ref 0.20–1.20)
Total Protein: 6.2 g/dL — ABNORMAL LOW (ref 6.4–8.3)

## 2015-11-11 LAB — UA PROTEIN, DIPSTICK - CHCC

## 2015-11-11 MED ORDER — SODIUM CHLORIDE 0.9 % IV SOLN
Freq: Once | INTRAVENOUS | Status: DC
Start: 2015-11-11 — End: 2015-11-11

## 2015-11-11 MED ORDER — PALONOSETRON HCL INJECTION 0.25 MG/5ML
0.2500 mg | Freq: Once | INTRAVENOUS | Status: AC
  Administered 2015-11-11: 0.25 mg via INTRAVENOUS

## 2015-11-11 MED ORDER — SODIUM CHLORIDE 0.9 % IV SOLN
7.3000 mg/kg | Freq: Once | INTRAVENOUS | Status: AC
  Administered 2015-11-11: 800 mg via INTRAVENOUS
  Filled 2015-11-11: qty 32

## 2015-11-11 MED ORDER — FLUOROURACIL CHEMO INJECTION 2.5 GM/50ML
240.0000 mg/m2 | Freq: Once | INTRAVENOUS | Status: AC
  Administered 2015-11-11: 550 mg via INTRAVENOUS
  Filled 2015-11-11: qty 11

## 2015-11-11 MED ORDER — ATROPINE SULFATE 1 MG/ML IJ SOLN
0.5000 mg | Freq: Once | INTRAMUSCULAR | Status: AC | PRN
  Administered 2015-11-11: 0.5 mg via INTRAVENOUS

## 2015-11-11 MED ORDER — SODIUM CHLORIDE 0.9 % IV SOLN
Freq: Once | INTRAVENOUS | Status: AC
  Administered 2015-11-11: 10:00:00 via INTRAVENOUS

## 2015-11-11 MED ORDER — HEPARIN SOD (PORK) LOCK FLUSH 100 UNIT/ML IV SOLN
500.0000 [IU] | Freq: Once | INTRAVENOUS | Status: AC
  Administered 2015-11-11: 500 [IU] via INTRAVENOUS
  Filled 2015-11-11: qty 5

## 2015-11-11 MED ORDER — IRINOTECAN HCL CHEMO INJECTION 100 MG/5ML
180.0000 mg/m2 | Freq: Once | INTRAVENOUS | Status: AC
  Administered 2015-11-11: 422 mg via INTRAVENOUS
  Filled 2015-11-11: qty 6.67

## 2015-11-11 MED ORDER — SODIUM CHLORIDE 0.9 % IV SOLN
1600.0000 mg/m2 | INTRAVENOUS | Status: DC
  Administered 2015-11-11: 3750 mg via INTRAVENOUS
  Filled 2015-11-11: qty 75

## 2015-11-11 MED ORDER — SODIUM CHLORIDE 0.9 % IV SOLN
Freq: Once | INTRAVENOUS | Status: AC
  Administered 2015-11-11: 11:00:00 via INTRAVENOUS
  Filled 2015-11-11: qty 5

## 2015-11-11 MED ORDER — LEUCOVORIN CALCIUM INJECTION 350 MG
240.0000 mg/m2 | Freq: Once | INTRAVENOUS | Status: AC
  Administered 2015-11-11: 562 mg via INTRAVENOUS
  Filled 2015-11-11: qty 28.1

## 2015-11-11 MED ORDER — SODIUM CHLORIDE 0.9% FLUSH
10.0000 mL | INTRAVENOUS | Status: DC | PRN
  Administered 2015-11-11: 10 mL via INTRAVENOUS
  Filled 2015-11-11: qty 10

## 2015-11-11 MED ORDER — ATROPINE SULFATE 1 MG/ML IJ SOLN
INTRAMUSCULAR | Status: AC
  Filled 2015-11-11: qty 1

## 2015-11-11 MED ORDER — PALONOSETRON HCL INJECTION 0.25 MG/5ML
INTRAVENOUS | Status: AC
  Filled 2015-11-11: qty 5

## 2015-11-11 NOTE — Progress Notes (Addendum)
[facility] OFFICE PROGRESS NOTE   Diagnosis: Colon cancer  INTERVAL HISTORY:   Bryan Fields returns as scheduled. He completed another cycle of FOLFIRI/Avastin 10/21/2015. He had less pain following the most recent cycle of chemotherapy.  He continues to have bleeding from the gums and looseness of the lower teeth. He reports the teeth were "welded "together by his dentist. The abdominal wall mass has not changed. Good appetite.  Objective:  Vital signs in last 24 hours:  Blood pressure 132/67, pulse 80, temperature 98.2 F (36.8 C), temperature source Oral, resp. rate 18, height '5\' 11"'$  (1.803 m), weight 256 lb 3.2 oz (116.212 kg), SpO2 99 %.    HEENT: No thrush or ulcers Resp: Lungs clear bilaterally Cardio: Regular rate and rhythm GI: No hepatomegaly, nontender, abdominal wall mass measures 6 cm in vertical dimension and 6 and a meters and transverse dimension Vascular: No leg edema   Portacath/PICC-without erythema  Lab Results:  Lab Results  Component Value Date   WBC 7.4 11/11/2015   HGB 13.7 11/11/2015   HCT 40.5 11/11/2015   MCV 103.9* 11/11/2015   PLT 125* 11/11/2015   NEUTROABS 5.3 11/11/2015     Lab Results  Component Value Date   CEA1 3.9 09/30/2015    Medications: I have reviewed the patient's current medications.  Assessment/Plan: 1. 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 Microsatellite stable; preserved expression of major and minor MMR proteins. He began treatment with FOLFOX in December 2011. He completed cycle #12 on 10/27/2010. Oxaliplatin was held with cycles number 7 through 10 and resumed with cycle #11. 2. History of neutropenia secondary to chemotherapy. 3. History of thrombocytopenia secondary chemotherapy. 4. He did not undergo a preoperative colonoscopy. He underwent a colonoscopy on 12/19/2010 with findings of  a 1 cm polyp at 90 cm from the anal verge, minimal polyp at 30 cm from the anal verge and minimal diverticulosis. Colonoscopy 03/18/2012-negative. 5. 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 He began study drug on 05/13/2010. 6. Status post Port-A-Cath placement. The Port-A-Cath has been removed. 7. 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 9. Low attenuation lesion in the caudate lobe of the liver on the CT 04/11/2012-? Cyst. MRI liver on 04/20/2012 favored the caudate lobe liver lesion to represent a cyst or complex cyst. CT abdomen/pelvis 04/17/2013 with continued enlargement of hypovascular lesion in the caudate lobe of the liver.  PET scan 10/27/2011 confirmed a hypermetabolic caudate lobe liver lesion, tiny indeterminate lung nodules, and a 9 mm level II left neck node   CT biopsy of the liver lesion 11/07/2013 confirmed adenocarcinoma  Left liver and caudate resection 01/15/2014-pathology confirmed metastatic colon cancer, and negative surgical margins  Surveillance CT scans 07/24/2014 consistent with recurrent disease involving upper abdominal lymph nodes, peritoneal implants, and an anterior abdominal wall mass  Status post biopsy of the anterior abdominal wall mass 08/06/2014 with pathology showing metastatic adenocarcinoma consistent with a colon primary.  UGT1A1 1/28  Cycle 1 FOLFIRI/Avastin on the [facility] 1317 08/20/2014  Cycle 2 FOLFIRI/Avastin 09/03/2014  Cycle 3 held on 09/17/2014 due to neutropenia  Cycle 3 FOLFIRI/Avastin 09/25/2014. Neulasta added beginning with cycle 3.  Cycle 4 FOLFIRI/Avastin 10/08/2014  Restaging CT scans 10/18/2014 with slight interval increase in the size of the soft tissue  mass in the subcutaneous fat of the epigastric region. Underlying peritoneal implants, probable focus of  residual disease along the resected margin of the liver and hepatoduodenal ligament lymphadenopathy all appeared slightly improved. No new sites of metastatic disease otherwise noted. New left lower lobe bronchopneumonia.  Cycle 5 FOLFIRI/Avastin 10/22/2014  Cycle 6 FOLFIRI/Avastin 11/06/2014  Cycle 7 FOLFIRI Avastin 11/19/2014  Cycle 8 FOLFIRI/Avastin 12/03/2014  CT 12/14/2014 with stable disease  Cycle 9 FOLFIRI/Avastin 12/17/2014  Cycle 10 FOLFIRI/Avastin 01/07/2015 (5-fluorouracil and leucovorin dose reduced)  Cycle 11 FOLFIRI/Avastin 01/21/2015  Cycle 12 FOLFIRI/Avastin 02/04/2015  CT 02/13/2015 with stable disease  Cycle 13 FOLFIRI/Avastin 02/25/2015-changed to an every three-week protocol, now off study  Avastin held with cycle 14 FOLFIRI on 03/25/2015 secondary to gingivitis  Restaging CTs 05/20/2015-slight increase in the size of the epigastric subcutaneous mass, stable peritoneal implants  FOLFIRI continued on a 3 week schedule  Avastin resumed 06/17/2015 10. Iron deposition within the liver noted on MRI 04/20/2012. The ferritin level was normal on 04/17/2013. Increased iron deposition noted on the left liver resection 01/15/2014 11. Pain/bleeding at the anus reported when here 04/24/2013. Status post evaluation by Dr. [name] with findings of an anal fissure. 12. Right face cystic lesion 13. Status post Port-A-Cath placement 08/14/2014 14. Delayed nausea following FOLFIRI, prophylactic Decadron added with cycle 2 with improvement. 15. Chest CT 10/18/2014 with new left lower lobe bronchopneumonia. Levaquin initiated. Resolved on CT 12/14/2014 16. Gum pain- mucositis?, Gingivitis?-Improved with discontinuation of Avastin 17. Pain/tenderness, mild erythema and full appearance over the maxillary sinuses. Maxillofacial CT 01/30/2015 with mild to moderate bilateral maxillary and sphenoid sinus inflammatory changes. Multifocal right maxillary dental periapical lucency. Status  post a right upper tooth extraction with resolution of the pain.   Disposition:  Bryan Fields appears stable. The plan is to continue FOLFIRI/Avastin. He will return for an office visit and chemotherapy in 3 weeks. He will be scheduled for restaging CTs after the next cycle of chemotherapy. His ECOG performance status is measured at 1 today.  Betsy Coder, MD  11/11/2015  9:41 AM

## 2015-11-11 NOTE — Patient Instructions (Addendum)
[place] Discharge Instructions for Patients Receiving Chemotherapy  Today you received the following chemotherapy agents: Avastin, Irinotecan, Leucovorin & Adrucil.   To help prevent nausea and vomiting after your treatment, we encourage you to take your nausea medication:as needed.    If you develop nausea and vomiting that is not controlled by your nausea medication, call the clinic.   BELOW ARE SYMPTOMS THAT SHOULD BE REPORTED IMMEDIATELY:  *FEVER GREATER THAN 100.5 F  *CHILLS WITH OR WITHOUT FEVER  NAUSEA AND VOMITING THAT IS NOT CONTROLLED WITH YOUR NAUSEA MEDICATION  *UNUSUAL SHORTNESS OF BREATH  *UNUSUAL BRUISING OR BLEEDING  TENDERNESS IN MOUTH AND THROAT WITH OR WITHOUT PRESENCE OF ULCERS  *URINARY PROBLEMS  *BOWEL PROBLEMS  UNUSUAL RASH Items with * indicate a potential emergency and should be followed up as soon as possible.  Feel free to call the clinic you have any questions or concerns. The clinic phone number is [phone].  Please show the [place] at check-in to the Emergency Department and triage nurse.

## 2015-11-11 NOTE — Telephone Encounter (Signed)
Gave and printed appt sched and avs for pt for June and July....the patient wants to pick up barium at nxt visit

## 2015-11-13 ENCOUNTER — Ambulatory Visit (HOSPITAL_BASED_OUTPATIENT_CLINIC_OR_DEPARTMENT_OTHER): Payer: 59

## 2015-11-13 ENCOUNTER — Ambulatory Visit: Payer: 59

## 2015-11-13 VITALS — BP 134/86 | HR 66 | Temp 98.9°F | Resp 20

## 2015-11-13 DIAGNOSIS — Z95828 Presence of other vascular implants and grafts: Secondary | ICD-10-CM

## 2015-11-13 DIAGNOSIS — C189 Malignant neoplasm of colon, unspecified: Secondary | ICD-10-CM

## 2015-11-13 DIAGNOSIS — Z5189 Encounter for other specified aftercare: Secondary | ICD-10-CM

## 2015-11-13 MED ORDER — SODIUM CHLORIDE 0.9 % IJ SOLN
10.0000 mL | INTRAMUSCULAR | Status: DC | PRN
  Administered 2015-11-13: 10 mL via INTRAVENOUS
  Filled 2015-11-13: qty 10

## 2015-11-13 MED ORDER — PEGFILGRASTIM INJECTION 6 MG/0.6ML ~~LOC~~
6.0000 mg | PREFILLED_SYRINGE | Freq: Once | SUBCUTANEOUS | Status: AC
  Administered 2015-11-13: 6 mg via SUBCUTANEOUS
  Filled 2015-11-13: qty 0.6

## 2015-11-13 MED ORDER — HEPARIN SOD (PORK) LOCK FLUSH 100 UNIT/ML IV SOLN
500.0000 [IU] | Freq: Once | INTRAVENOUS | Status: AC | PRN
  Administered 2015-11-13: 500 [IU] via INTRAVENOUS
  Filled 2015-11-13: qty 5

## 2015-11-13 NOTE — Patient Instructions (Signed)
Pegfilgrastim injection What is this medicine? PEGFILGRASTIM (PEG fil gra stim) is a long-acting granulocyte colony-stimulating factor that stimulates the growth of neutrophils, a type of white blood cell important in the body's fight against infection. It is used to reduce the incidence of fever and infection in patients with certain types of cancer who are receiving chemotherapy that affects the bone marrow, and to increase survival after being exposed to high doses of radiation. This medicine may be used for other purposes; ask your health care provider or pharmacist if you have questions. What should I tell my health care provider before I take this medicine? They need to know if you have any of these conditions: -kidney disease -latex allergy -ongoing radiation therapy -sickle cell disease -skin reactions to acrylic adhesives (On-Body Injector only) -an unusual or allergic reaction to pegfilgrastim, filgrastim, other medicines, foods, dyes, or preservatives -pregnant or trying to get pregnant -breast-feeding How should I use this medicine? This medicine is for injection under the skin. If you get this medicine at home, you will be taught how to prepare and give the pre-filled syringe or how to use the On-body Injector. Refer to the patient Instructions for Use for detailed instructions. Use exactly as directed. Take your medicine at regular intervals. Do not take your medicine more often than directed. It is important that you put your used needles and syringes in a special sharps container. Do not put them in a trash can. If you do not have a sharps container, call your pharmacist or healthcare provider to get one. Talk to your pediatrician regarding the use of this medicine in children. While this drug may be prescribed for selected conditions, precautions do apply. Overdosage: If you think you have taken too much of this medicine contact a poison control center or emergency room at  once. NOTE: This medicine is only for you. Do not share this medicine with others. What if I miss a dose? It is important not to miss your dose. Call your doctor or health care professional if you miss your dose. If you miss a dose due to an On-body Injector failure or leakage, a new dose should be administered as soon as possible using a single prefilled syringe for manual use. What may interact with this medicine? Interactions have not been studied. Give your health care provider a list of all the medicines, herbs, non-prescription drugs, or dietary supplements you use. Also tell them if you smoke, drink alcohol, or use illegal drugs. Some items may interact with your medicine. This list may not describe all possible interactions. Give your health care provider a list of all the medicines, herbs, non-prescription drugs, or dietary supplements you use. Also tell them if you smoke, drink alcohol, or use illegal drugs. Some items may interact with your medicine. What should I watch for while using this medicine? You may need blood work done while you are taking this medicine. If you are going to need a MRI, CT scan, or other procedure, tell your doctor that you are using this medicine (On-Body Injector only). What side effects may I notice from receiving this medicine? Side effects that you should report to your doctor or health care professional as soon as possible: -allergic reactions like skin rash, itching or hives, swelling of the face, lips, or tongue -dizziness -fever -pain, redness, or irritation at site where injected -pinpoint red spots on the skin -red or dark-brown urine -shortness of breath or breathing problems -stomach or side pain, or pain   at the shoulder -swelling -tiredness -trouble passing urine or change in the amount of urine Side effects that usually do not require medical attention (report to your doctor or health care professional if they continue or are  bothersome): -bone pain -muscle pain This list may not describe all possible side effects. Call your doctor for medical advice about side effects. You may report side effects to FDA at 1-800-FDA-1088. Where should I keep my medicine? Keep out of the reach of children. Store pre-filled syringes in a refrigerator between 2 and 8 degrees C (36 and 46 degrees F). Do not freeze. Keep in carton to protect from light. Throw away this medicine if it is left out of the refrigerator for more than 48 hours. Throw away any unused medicine after the expiration date. NOTE: This sheet is a summary. It may not cover all possible information. If you have questions about this medicine, talk to your doctor, pharmacist, or health care provider.    2016, Elsevier/Gold Standard. (2014-06-14 14:30:14)  

## 2015-11-13 NOTE — Progress Notes (Signed)
Was completed in Infusion Room

## 2015-12-01 ENCOUNTER — Other Ambulatory Visit: Payer: Self-pay | Admitting: Oncology

## 2015-12-02 ENCOUNTER — Ambulatory Visit (HOSPITAL_BASED_OUTPATIENT_CLINIC_OR_DEPARTMENT_OTHER): Payer: 59 | Admitting: Oncology

## 2015-12-02 ENCOUNTER — Ambulatory Visit (HOSPITAL_BASED_OUTPATIENT_CLINIC_OR_DEPARTMENT_OTHER): Payer: 59

## 2015-12-02 ENCOUNTER — Ambulatory Visit: Payer: 59

## 2015-12-02 ENCOUNTER — Other Ambulatory Visit (HOSPITAL_BASED_OUTPATIENT_CLINIC_OR_DEPARTMENT_OTHER): Payer: 59

## 2015-12-02 ENCOUNTER — Other Ambulatory Visit: Payer: Self-pay | Admitting: *Deleted

## 2015-12-02 ENCOUNTER — Telehealth: Payer: Self-pay | Admitting: Oncology

## 2015-12-02 VITALS — BP 144/70 | HR 80 | Temp 98.4°F | Resp 16 | Ht 71.0 in | Wt 252.4 lb

## 2015-12-02 DIAGNOSIS — C189 Malignant neoplasm of colon, unspecified: Secondary | ICD-10-CM

## 2015-12-02 DIAGNOSIS — C787 Secondary malignant neoplasm of liver and intrahepatic bile duct: Secondary | ICD-10-CM | POA: Diagnosis not present

## 2015-12-02 DIAGNOSIS — C182 Malignant neoplasm of ascending colon: Secondary | ICD-10-CM | POA: Diagnosis not present

## 2015-12-02 DIAGNOSIS — Z5111 Encounter for antineoplastic chemotherapy: Secondary | ICD-10-CM

## 2015-12-02 DIAGNOSIS — Z5112 Encounter for antineoplastic immunotherapy: Secondary | ICD-10-CM

## 2015-12-02 DIAGNOSIS — K055 Other periodontal diseases: Secondary | ICD-10-CM | POA: Diagnosis not present

## 2015-12-02 DIAGNOSIS — Z95828 Presence of other vascular implants and grafts: Secondary | ICD-10-CM

## 2015-12-02 LAB — CBC WITH DIFFERENTIAL/PLATELET
BASO%: 0.3 % (ref 0.0–2.0)
BASOS ABS: 0 10*3/uL (ref 0.0–0.1)
EOS%: 2.7 % (ref 0.0–7.0)
Eosinophils Absolute: 0.2 10*3/uL (ref 0.0–0.5)
HCT: 41.1 % (ref 38.4–49.9)
HGB: 13.8 g/dL (ref 13.0–17.1)
LYMPH%: 21 % (ref 14.0–49.0)
MCH: 34.9 pg — AB (ref 27.2–33.4)
MCHC: 33.6 g/dL (ref 32.0–36.0)
MCV: 104.1 fL — ABNORMAL HIGH (ref 79.3–98.0)
MONO#: 0.6 10*3/uL (ref 0.1–0.9)
MONO%: 8.5 % (ref 0.0–14.0)
NEUT#: 4.9 10*3/uL (ref 1.5–6.5)
NEUT%: 67.5 % (ref 39.0–75.0)
Platelets: 129 10*3/uL — ABNORMAL LOW (ref 140–400)
RBC: 3.95 10*6/uL — AB (ref 4.20–5.82)
RDW: 14.3 % (ref 11.0–14.6)
WBC: 7.3 10*3/uL (ref 4.0–10.3)
lymph#: 1.5 10*3/uL (ref 0.9–3.3)

## 2015-12-02 LAB — COMPREHENSIVE METABOLIC PANEL
ALT: 46 U/L (ref 0–55)
AST: 27 U/L (ref 5–34)
Albumin: 3.3 g/dL — ABNORMAL LOW (ref 3.5–5.0)
Alkaline Phosphatase: 50 U/L (ref 40–150)
Anion Gap: 7 mEq/L (ref 3–11)
BUN: 15.7 mg/dL (ref 7.0–26.0)
CHLORIDE: 107 meq/L (ref 98–109)
CO2: 25 mEq/L (ref 22–29)
Calcium: 8.7 mg/dL (ref 8.4–10.4)
Creatinine: 1.1 mg/dL (ref 0.7–1.3)
EGFR: 77 mL/min/{1.73_m2} — ABNORMAL LOW (ref >90)
GLUCOSE: 163 mg/dL — AB (ref 70–140)
POTASSIUM: 3.7 meq/L (ref 3.5–5.1)
SODIUM: 139 meq/L (ref 136–145)
Total Bilirubin: 0.6 mg/dL (ref 0.20–1.20)
Total Protein: 6.1 g/dL — ABNORMAL LOW (ref 6.4–8.3)

## 2015-12-02 LAB — UA PROTEIN, DIPSTICK - CHCC: PROTEIN: NEGATIVE mg/dL

## 2015-12-02 MED ORDER — DEXTROSE 5 % IV SOLN
240.0000 mg/m2 | Freq: Once | INTRAVENOUS | Status: AC
  Administered 2015-12-02: 562 mg via INTRAVENOUS
  Filled 2015-12-02: qty 28.1

## 2015-12-02 MED ORDER — SODIUM CHLORIDE 0.9 % IV SOLN
Freq: Once | INTRAVENOUS | Status: AC
  Administered 2015-12-02: 10:00:00 via INTRAVENOUS

## 2015-12-02 MED ORDER — SODIUM CHLORIDE 0.9 % IJ SOLN
10.0000 mL | INTRAMUSCULAR | Status: DC | PRN
  Administered 2015-12-02: 10 mL via INTRAVENOUS
  Filled 2015-12-02: qty 10

## 2015-12-02 MED ORDER — FLUOROURACIL CHEMO INJECTION 5 GM/100ML
1600.0000 mg/m2 | INTRAVENOUS | Status: DC
  Administered 2015-12-02: 3750 mg via INTRAVENOUS
  Filled 2015-12-02: qty 75

## 2015-12-02 MED ORDER — PALONOSETRON HCL INJECTION 0.25 MG/5ML
INTRAVENOUS | Status: AC
  Filled 2015-12-02: qty 5

## 2015-12-02 MED ORDER — FLUOROURACIL CHEMO INJECTION 2.5 GM/50ML
240.0000 mg/m2 | Freq: Once | INTRAVENOUS | Status: AC
  Administered 2015-12-02: 550 mg via INTRAVENOUS
  Filled 2015-12-02: qty 11

## 2015-12-02 MED ORDER — PALONOSETRON HCL INJECTION 0.25 MG/5ML
0.2500 mg | Freq: Once | INTRAVENOUS | Status: AC
  Administered 2015-12-02: 0.25 mg via INTRAVENOUS

## 2015-12-02 MED ORDER — DEXAMETHASONE 4 MG PO TABS: ORAL_TABLET | ORAL | Status: DC

## 2015-12-02 MED ORDER — ATROPINE SULFATE 1 MG/ML IJ SOLN
INTRAMUSCULAR | Status: AC
  Filled 2015-12-02: qty 1

## 2015-12-02 MED ORDER — ATROPINE SULFATE 1 MG/ML IJ SOLN
0.5000 mg | Freq: Once | INTRAMUSCULAR | Status: AC | PRN
  Administered 2015-12-02: 0.5 mg via INTRAVENOUS

## 2015-12-02 MED ORDER — SODIUM CHLORIDE 0.9 % IV SOLN
Freq: Once | INTRAVENOUS | Status: AC
  Administered 2015-12-02: 10:00:00 via INTRAVENOUS
  Filled 2015-12-02: qty 5

## 2015-12-02 MED ORDER — LIDOCAINE-PRILOCAINE 2.5-2.5 % EX CREA: TOPICAL_CREAM | CUTANEOUS | Status: DC

## 2015-12-02 MED ORDER — DEXTROSE 5 % IV SOLN
180.0000 mg/m2 | Freq: Once | INTRAVENOUS | Status: AC
  Administered 2015-12-02: 422 mg via INTRAVENOUS
  Filled 2015-12-02: qty 6.67

## 2015-12-02 MED ORDER — BEVACIZUMAB CHEMO INJECTION 400 MG/16ML
7.3000 mg/kg | Freq: Once | INTRAVENOUS | Status: AC
  Administered 2015-12-02: 800 mg via INTRAVENOUS
  Filled 2015-12-02: qty 32

## 2015-12-02 NOTE — Telephone Encounter (Signed)
Pt will p/u sched in tx room °

## 2015-12-02 NOTE — Patient Instructions (Signed)
[facility] Discharge Instructions for Patients Receiving Chemotherapy  Today you received the following chemotherapy agents: Avastin, Irinotecan, Leucovorin & Adrucil.   To help prevent nausea and vomiting after your treatment, we encourage you to take your nausea medication:as needed.    If you develop nausea and vomiting that is not controlled by your nausea medication, call the clinic.   BELOW ARE SYMPTOMS THAT SHOULD BE REPORTED IMMEDIATELY:  *FEVER GREATER THAN 100.5 F  *CHILLS WITH OR WITHOUT FEVER  NAUSEA AND VOMITING THAT IS NOT CONTROLLED WITH YOUR NAUSEA MEDICATION  *UNUSUAL SHORTNESS OF BREATH  *UNUSUAL BRUISING OR BLEEDING  TENDERNESS IN MOUTH AND THROAT WITH OR WITHOUT PRESENCE OF ULCERS  *URINARY PROBLEMS  *BOWEL PROBLEMS  UNUSUAL RASH Items with * indicate a potential emergency and should be followed up as soon as possible.  Feel free to call the clinic you have any questions or concerns. The clinic phone number is [phone].  Please show the [place] at check-in to the Emergency Department and triage nurse.

## 2015-12-02 NOTE — Telephone Encounter (Signed)
Gave and pritned appt sched and avs for pt for June thru Aug °

## 2015-12-02 NOTE — Progress Notes (Signed)
[name] OFFICE PROGRESS NOTE   Diagnosis: Colon cancer  INTERVAL HISTORY:   [name] returns as scheduled. He completed another cycle of FOLFIRI/Avastin 11/11/2015. He continues to have pain and bleeding at the gums. No other bleeding. He feels well. Good appetite.  Objective:  Vital signs in last 24 hours:  Blood pressure 144/70, pulse 80, temperature 98.4 F (36.9 C), temperature source Oral, resp. rate 16, height '5\' 11"'$  (1.803 m), weight 252 lb 6.4 oz (114.488 kg), SpO2 100 %.    HEENT: No thrush, ulcers, or bleeding Resp: Lungs clear bilaterally Cardio: Regular rate and rhythm GI: No hepatomegaly, nontender, abdominal wall mass measures 6 cm in vertical and 6 cm in transverse dimension Vascular: No leg edema     Portacath/PICC-without erythema  Lab Results:  Lab Results  Component Value Date   WBC 7.3 12/02/2015   HGB 13.8 12/02/2015   HCT 41.1 12/02/2015   MCV 104.1* 12/02/2015   PLT 129* 12/02/2015   NEUTROABS 4.9 12/02/2015      Lab Results  Component Value Date   CEA1 3.9 09/30/2015    Medications: I have reviewed the patient's current medications.  Assessment/Plan: 1. 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 Microsatellite stable; preserved expression of major and minor MMR proteins. He began treatment with FOLFOX in December 2011. He completed cycle #12 on 10/27/2010. Oxaliplatin was held with cycles number 7 through 10 and resumed with cycle #11. 2. History of neutropenia secondary to chemotherapy. 3. History of thrombocytopenia secondary chemotherapy. 4. He did not undergo a preoperative colonoscopy. He underwent a colonoscopy on 12/19/2010 with findings of a 1 cm polyp at 90 cm from the anal verge, minimal polyp at 30 cm from the anal verge and minimal diverticulosis. Colonoscopy 03/18/2012-negative. 5. Enrollment on  the [id number] peripheral neuropathy study drug. He began study drug on 05/13/2010. 6. Status post Port-A-Cath placement. The Port-A-Cath has been removed. 7. 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 9. Low attenuation lesion in the caudate lobe of the liver on the CT 04/11/2012-? Cyst. MRI liver on 04/20/2012 favored the caudate lobe liver lesion to represent a cyst or complex cyst. CT abdomen/pelvis 04/17/2013 with continued enlargement of hypovascular lesion in the caudate lobe of the liver.  PET scan 10/27/2011 confirmed a hypermetabolic caudate lobe liver lesion, tiny indeterminate lung nodules, and a 9 mm level II left neck node   CT biopsy of the liver lesion 11/07/2013 confirmed adenocarcinoma  Left liver and caudate resection 01/15/2014-pathology confirmed metastatic colon cancer, and negative surgical margins  Surveillance CT scans 07/24/2014 consistent with recurrent disease involving upper abdominal lymph nodes, peritoneal implants, and an anterior abdominal wall mass  Status post biopsy of the anterior abdominal wall mass 08/06/2014 with pathology showing metastatic adenocarcinoma consistent with a colon primary.  UGT1A1 1/28  Cycle 1 FOLFIRI/Avastin on the [facility] 1317 08/20/2014  Cycle 2 FOLFIRI/Avastin 09/03/2014  Cycle 3 held on 09/17/2014 due to neutropenia  Cycle 3 FOLFIRI/Avastin 09/25/2014. Neulasta added beginning with cycle 3.  Cycle 4 FOLFIRI/Avastin 10/08/2014  Restaging CT scans 10/18/2014 with slight interval increase in the size of the soft tissue mass in the subcutaneous fat of the epigastric region. Underlying peritoneal implants, probable focus of residual disease along the resected margin of the liver and hepatoduodenal  ligament lymphadenopathy all appeared slightly improved. No new sites of metastatic  disease otherwise noted. New left lower lobe bronchopneumonia.  Cycle 5 FOLFIRI/Avastin 10/22/2014  Cycle 6 FOLFIRI/Avastin 11/06/2014  Cycle 7 FOLFIRI Avastin 11/19/2014  Cycle 8 FOLFIRI/Avastin 12/03/2014  CT 12/14/2014 with stable disease  Cycle 9 FOLFIRI/Avastin 12/17/2014  Cycle 10 FOLFIRI/Avastin 01/07/2015 (5-fluorouracil and leucovorin dose reduced)  Cycle 11 FOLFIRI/Avastin 01/21/2015  Cycle 12 FOLFIRI/Avastin 02/04/2015  CT 02/13/2015 with stable disease  Cycle 13 FOLFIRI/Avastin 02/25/2015-changed to an every three-week protocol, now off study  Avastin held with cycle 14 FOLFIRI on 03/25/2015 secondary to gingivitis  Restaging CTs 05/20/2015-slight increase in the size of the epigastric subcutaneous mass, stable peritoneal implants  FOLFIRI continued on a 3 week schedule  Avastin resumed 06/17/2015 10. Iron deposition within the liver noted on MRI 04/20/2012. The ferritin level was normal on 04/17/2013. Increased iron deposition noted on the left liver resection 01/15/2014 11. Pain/bleeding at the anus reported when here 04/24/2013. Status post evaluation by Dr. [name] with findings of an anal fissure. 12. Right face cystic lesion 13. Status post Port-A-Cath placement 08/14/2014 14. Delayed nausea following FOLFIRI, prophylactic Decadron added with cycle 2 with improvement. 15. Chest CT 10/18/2014 with new left lower lobe bronchopneumonia. Levaquin initiated. Resolved on CT 12/14/2014 16. Gum pain- mucositis?, Gingivitis?-Improved with discontinuation of Avastin 17. Pain/tenderness, mild erythema and full appearance over the maxillary sinuses. Maxillofacial CT 01/30/2015 with mild to moderate bilateral maxillary and sphenoid sinus inflammatory changes. Multifocal right maxillary dental periapical lucency. Status post a right upper tooth extraction with resolution of the pain.   Disposition:  Bryan Fields appears stable. He will complete another treatment with  FOLFIRI/Avastin today. He plans a vacation over the next few weeks. He will be scheduled for restaging CT scans during the week of 12/23/2015. He is scheduled for an office visit and the next cycle of chemotherapy on 01/06/2016.  Betsy Coder, MD  12/02/2015  5:33 PM

## 2015-12-04 ENCOUNTER — Ambulatory Visit: Payer: 59

## 2015-12-04 ENCOUNTER — Ambulatory Visit (HOSPITAL_BASED_OUTPATIENT_CLINIC_OR_DEPARTMENT_OTHER): Payer: 59

## 2015-12-04 VITALS — BP 143/83 | HR 68 | Temp 97.9°F | Resp 18

## 2015-12-04 DIAGNOSIS — C787 Secondary malignant neoplasm of liver and intrahepatic bile duct: Secondary | ICD-10-CM | POA: Diagnosis not present

## 2015-12-04 DIAGNOSIS — C182 Malignant neoplasm of ascending colon: Secondary | ICD-10-CM | POA: Diagnosis not present

## 2015-12-04 DIAGNOSIS — Z5189 Encounter for other specified aftercare: Secondary | ICD-10-CM

## 2015-12-04 DIAGNOSIS — C189 Malignant neoplasm of colon, unspecified: Secondary | ICD-10-CM

## 2015-12-04 MED ORDER — HEPARIN SOD (PORK) LOCK FLUSH 100 UNIT/ML IV SOLN
500.0000 [IU] | Freq: Once | INTRAVENOUS | Status: AC | PRN
  Administered 2015-12-04: 500 [IU]
  Filled 2015-12-04: qty 5

## 2015-12-04 MED ORDER — PEGFILGRASTIM INJECTION 6 MG/0.6ML ~~LOC~~
6.0000 mg | PREFILLED_SYRINGE | Freq: Once | SUBCUTANEOUS | Status: AC
  Administered 2015-12-04: 6 mg via SUBCUTANEOUS
  Filled 2015-12-04: qty 0.6

## 2015-12-04 MED ORDER — SODIUM CHLORIDE 0.9 % IJ SOLN
10.0000 mL | INTRAMUSCULAR | Status: DC | PRN
  Administered 2015-12-04: 10 mL
  Filled 2015-12-04: qty 10

## 2015-12-04 NOTE — Progress Notes (Signed)
To be completed in Infusion Room 

## 2015-12-04 NOTE — Patient Instructions (Signed)

## 2015-12-11 ENCOUNTER — Telehealth: Payer: Self-pay | Admitting: *Deleted

## 2015-12-11 DIAGNOSIS — T451X5A Adverse effect of antineoplastic and immunosuppressive drugs, initial encounter: Principal | ICD-10-CM

## 2015-12-11 DIAGNOSIS — K521 Toxic gastroenteritis and colitis: Secondary | ICD-10-CM

## 2015-12-11 MED ORDER — DIPHENOXYLATE-ATROPINE 2.5-0.025 MG PO TABS: 1.0000 | ORAL_TABLET | Freq: Four times a day (QID) | ORAL | Status: DC | PRN

## 2015-12-11 NOTE — Telephone Encounter (Signed)
Discussed pt's call with Dr. Benay Spice via phone. Order received for Lomotil 1-2 tabs QID PRN diarrhea. Pt may take up to 8 per day. Returned call to pt with Lomotil instructions. He voiced understanding.

## 2015-12-11 NOTE — Telephone Encounter (Signed)
TC from patient stating he is having significant problem with diarrhea in the last couple of days... At least 6-7 times in the last 24 hours. He states the diarrhea got him up every 2-3 hours during the night. He has tried Imodium but it has not been effective. His last treatment was 12/02/15 - Folfiri/Avastin.  He denies fever, nausea, vomiting. He states he probably is not drinking enough. Advised him that he may need IV fluids. Pt prefers to try stronger anti-diarrheal meds first before coming in. He states he will increase his fluid intake.  Spoke with Lavella Lemons, RN with Dr. Benay Spice.  Dr. Benay Spice is out of the office today so Lavella Lemons will discuss with Ned Card, NP and then call prescription in to pt's pharmacy, [facility] on [address].

## 2015-12-23 ENCOUNTER — Encounter (HOSPITAL_COMMUNITY): Payer: Self-pay

## 2015-12-23 ENCOUNTER — Ambulatory Visit (HOSPITAL_COMMUNITY)
Admission: RE | Admit: 2015-12-23 | Discharge: 2015-12-23 | Disposition: A | Discharge status: Home / Self Care | Payer: 59 | Source: Ambulatory Visit | Attending: Oncology | Admitting: Oncology

## 2015-12-23 DIAGNOSIS — C182 Malignant neoplasm of ascending colon: Secondary | ICD-10-CM | POA: Insufficient documentation

## 2015-12-23 DIAGNOSIS — I2510 Atherosclerotic heart disease of native coronary artery without angina pectoris: Secondary | ICD-10-CM | POA: Insufficient documentation

## 2015-12-23 DIAGNOSIS — R918 Other nonspecific abnormal finding of lung field: Secondary | ICD-10-CM | POA: Diagnosis not present

## 2015-12-23 DIAGNOSIS — I700 Atherosclerosis of aorta: Secondary | ICD-10-CM | POA: Insufficient documentation

## 2015-12-23 DIAGNOSIS — C7951 Secondary malignant neoplasm of bone: Secondary | ICD-10-CM | POA: Diagnosis not present

## 2015-12-23 DIAGNOSIS — C786 Secondary malignant neoplasm of retroperitoneum and peritoneum: Secondary | ICD-10-CM | POA: Diagnosis not present

## 2015-12-23 DIAGNOSIS — K8020 Calculus of gallbladder without cholecystitis without obstruction: Secondary | ICD-10-CM | POA: Diagnosis not present

## 2015-12-23 MED ORDER — IOPAMIDOL (ISOVUE-300) INJECTION 61%
100.0000 mL | Freq: Once | INTRAVENOUS | Status: AC | PRN
  Administered 2015-12-23: 100 mL via INTRAVENOUS

## 2016-01-06 ENCOUNTER — Encounter (HOSPITAL_COMMUNITY): Payer: Self-pay | Admitting: Interventional Radiology

## 2016-01-06 ENCOUNTER — Ambulatory Visit: Payer: 59

## 2016-01-06 ENCOUNTER — Telehealth: Payer: Self-pay | Admitting: Oncology

## 2016-01-06 ENCOUNTER — Ambulatory Visit (HOSPITAL_BASED_OUTPATIENT_CLINIC_OR_DEPARTMENT_OTHER): Payer: 59

## 2016-01-06 ENCOUNTER — Ambulatory Visit (HOSPITAL_COMMUNITY)
Admission: RE | Admit: 2016-01-06 | Discharge: 2016-01-06 | Disposition: A | Discharge status: Home / Self Care | Payer: 59 | Source: Ambulatory Visit | Attending: Oncology | Admitting: Oncology

## 2016-01-06 ENCOUNTER — Other Ambulatory Visit (HOSPITAL_COMMUNITY): Payer: Self-pay | Admitting: Interventional Radiology

## 2016-01-06 ENCOUNTER — Ambulatory Visit (HOSPITAL_BASED_OUTPATIENT_CLINIC_OR_DEPARTMENT_OTHER): Payer: 59 | Admitting: Oncology

## 2016-01-06 ENCOUNTER — Other Ambulatory Visit (HOSPITAL_BASED_OUTPATIENT_CLINIC_OR_DEPARTMENT_OTHER): Payer: 59

## 2016-01-06 VITALS — BP 135/77 | HR 63 | Temp 98.5°F | Resp 18 | Ht 71.0 in | Wt 251.3 lb

## 2016-01-06 DIAGNOSIS — C787 Secondary malignant neoplasm of liver and intrahepatic bile duct: Secondary | ICD-10-CM

## 2016-01-06 DIAGNOSIS — C189 Malignant neoplasm of colon, unspecified: Secondary | ICD-10-CM | POA: Diagnosis not present

## 2016-01-06 DIAGNOSIS — Z95828 Presence of other vascular implants and grafts: Secondary | ICD-10-CM

## 2016-01-06 DIAGNOSIS — C182 Malignant neoplasm of ascending colon: Secondary | ICD-10-CM

## 2016-01-06 DIAGNOSIS — Z452 Encounter for adjustment and management of vascular access device: Secondary | ICD-10-CM

## 2016-01-06 DIAGNOSIS — Z9221 Personal history of antineoplastic chemotherapy: Secondary | ICD-10-CM | POA: Diagnosis not present

## 2016-01-06 DIAGNOSIS — T82898A Other specified complication of vascular prosthetic devices, implants and grafts, initial encounter: Secondary | ICD-10-CM | POA: Diagnosis present

## 2016-01-06 DIAGNOSIS — K055 Other periodontal diseases: Secondary | ICD-10-CM | POA: Diagnosis not present

## 2016-01-06 DIAGNOSIS — Y831 Surgical operation with implant of artificial internal device as the cause of abnormal reaction of the patient, or of later complication, without mention of misadventure at the time of the procedure: Secondary | ICD-10-CM | POA: Diagnosis not present

## 2016-01-06 DIAGNOSIS — Z5111 Encounter for antineoplastic chemotherapy: Secondary | ICD-10-CM | POA: Diagnosis not present

## 2016-01-06 HISTORY — PX: IR GENERIC HISTORICAL: IMG1180011

## 2016-01-06 LAB — CBC WITH DIFFERENTIAL/PLATELET
BASO%: 0.6 % (ref 0.0–2.0)
BASOS ABS: 0.1 10*3/uL (ref 0.0–0.1)
EOS ABS: 0.3 10*3/uL (ref 0.0–0.5)
EOS%: 2.4 % (ref 0.0–7.0)
HCT: 44 % (ref 38.4–49.9)
HGB: 14.8 g/dL (ref 13.0–17.1)
LYMPH%: 17.2 % (ref 14.0–49.0)
MCH: 34.5 pg — AB (ref 27.2–33.4)
MCHC: 33.6 g/dL (ref 32.0–36.0)
MCV: 102.8 fL — AB (ref 79.3–98.0)
MONO#: 1.1 10*3/uL — AB (ref 0.1–0.9)
MONO%: 10.2 % (ref 0.0–14.0)
NEUT%: 69.6 % (ref 39.0–75.0)
NEUTROS ABS: 7.3 10*3/uL — AB (ref 1.5–6.5)
PLATELETS: 178 10*3/uL (ref 140–400)
RBC: 4.28 10*6/uL (ref 4.20–5.82)
RDW: 13.9 % (ref 11.0–14.6)
WBC: 10.5 10*3/uL — AB (ref 4.0–10.3)
lymph#: 1.8 10*3/uL (ref 0.9–3.3)

## 2016-01-06 LAB — COMPREHENSIVE METABOLIC PANEL
ALBUMIN: 3.5 g/dL (ref 3.5–5.0)
ALK PHOS: 63 U/L (ref 40–150)
ALT: 39 U/L (ref 0–55)
ANION GAP: 9 meq/L (ref 3–11)
AST: 27 U/L (ref 5–34)
BILIRUBIN TOTAL: 0.68 mg/dL (ref 0.20–1.20)
BUN: 17.5 mg/dL (ref 7.0–26.0)
CO2: 26 meq/L (ref 22–29)
Calcium: 9.4 mg/dL (ref 8.4–10.4)
Chloride: 107 mEq/L (ref 98–109)
Creatinine: 1.1 mg/dL (ref 0.7–1.3)
EGFR: 78 mL/min/{1.73_m2} — AB (ref >90)
Glucose: 125 mg/dl (ref 70–140)
POTASSIUM: 4 meq/L (ref 3.5–5.1)
Sodium: 142 mEq/L (ref 136–145)
TOTAL PROTEIN: 7 g/dL (ref 6.4–8.3)

## 2016-01-06 LAB — UA PROTEIN, DIPSTICK - CHCC: Protein, ur: <30 mg/dL

## 2016-01-06 MED ORDER — ATROPINE SULFATE 1 MG/ML IJ SOLN
INTRAMUSCULAR | Status: AC
  Filled 2016-01-06: qty 1

## 2016-01-06 MED ORDER — ALTEPLASE 2 MG IJ SOLR
2.0000 mg | Freq: Once | INTRAMUSCULAR | Status: DC | PRN
  Filled 2016-01-06: qty 2

## 2016-01-06 MED ORDER — FLUOROURACIL CHEMO INJECTION 2.5 GM/50ML
240.0000 mg/m2 | Freq: Once | INTRAVENOUS | Status: AC
  Administered 2016-01-06: 550 mg via INTRAVENOUS
  Filled 2016-01-06: qty 11

## 2016-01-06 MED ORDER — ALTEPLASE 2 MG IJ SOLR
2.0000 mg | Freq: Once | INTRAMUSCULAR | Status: AC | PRN
  Administered 2016-01-06: 2 mg
  Filled 2016-01-06: qty 2

## 2016-01-06 MED ORDER — HEPARIN SOD (PORK) LOCK FLUSH 100 UNIT/ML IV SOLN
500.0000 [IU] | Freq: Once | INTRAVENOUS | Status: DC | PRN
  Filled 2016-01-06: qty 5

## 2016-01-06 MED ORDER — PALONOSETRON HCL INJECTION 0.25 MG/5ML
INTRAVENOUS | Status: AC
  Filled 2016-01-06: qty 5

## 2016-01-06 MED ORDER — SODIUM CHLORIDE 0.9 % IJ SOLN
10.0000 mL | INTRAMUSCULAR | Status: DC | PRN
  Filled 2016-01-06: qty 10

## 2016-01-06 MED ORDER — SODIUM CHLORIDE 0.9 % IJ SOLN
10.0000 mL | INTRAMUSCULAR | Status: DC | PRN
  Administered 2016-01-06: 10 mL via INTRAVENOUS
  Filled 2016-01-06: qty 10

## 2016-01-06 MED ORDER — PALONOSETRON HCL INJECTION 0.25 MG/5ML
0.2500 mg | Freq: Once | INTRAVENOUS | Status: AC
  Administered 2016-01-06: 0.25 mg via INTRAVENOUS

## 2016-01-06 MED ORDER — SODIUM CHLORIDE 0.9 % IV SOLN
Freq: Once | INTRAVENOUS | Status: AC
  Administered 2016-01-06: 15:00:00 via INTRAVENOUS
  Filled 2016-01-06: qty 5

## 2016-01-06 MED ORDER — LEUCOVORIN CALCIUM INJECTION 350 MG
240.0000 mg/m2 | Freq: Once | INTRAVENOUS | Status: AC
  Administered 2016-01-06: 562 mg via INTRAVENOUS
  Filled 2016-01-06: qty 28.1

## 2016-01-06 MED ORDER — SODIUM CHLORIDE 0.9 % IV SOLN
1600.0000 mg/m2 | INTRAVENOUS | Status: DC
  Administered 2016-01-06: 3750 mg via INTRAVENOUS
  Filled 2016-01-06: qty 75

## 2016-01-06 MED ORDER — SODIUM CHLORIDE 0.9 % IV SOLN
Freq: Once | INTRAVENOUS | Status: AC
  Administered 2016-01-06: 15:00:00 via INTRAVENOUS

## 2016-01-06 MED ORDER — ATROPINE SULFATE 1 MG/ML IJ SOLN
0.5000 mg | Freq: Once | INTRAMUSCULAR | Status: AC | PRN
  Administered 2016-01-06: 0.5 mg via INTRAVENOUS

## 2016-01-06 MED ORDER — IRINOTECAN HCL CHEMO INJECTION 100 MG/5ML
180.0000 mg/m2 | Freq: Once | INTRAVENOUS | Status: AC
  Administered 2016-01-06: 420 mg via INTRAVENOUS
  Filled 2016-01-06: qty 15

## 2016-01-06 MED ORDER — IOPAMIDOL (ISOVUE-300) INJECTION 61%
10.0000 mL | Freq: Once | INTRAVENOUS | Status: AC | PRN
  Administered 2016-01-06: 10 mL via INTRAVENOUS

## 2016-01-06 NOTE — Patient Instructions (Signed)

## 2016-01-06 NOTE — Patient Instructions (Signed)
[place] Discharge Instructions for Patients Receiving Chemotherapy  Today you received the following chemotherapy agents Irinotecan, Leucovorin, Adrucil.   To help prevent nausea and vomiting after your treatment, we encourage you to take your nausea medication as directed and as needed but NO ZOFRAN FOR 3 DAYS.    If you develop nausea and vomiting that is not controlled by your nausea medication, call the clinic.   BELOW ARE SYMPTOMS THAT SHOULD BE REPORTED IMMEDIATELY:  *FEVER GREATER THAN 100.5 F  *CHILLS WITH OR WITHOUT FEVER  NAUSEA AND VOMITING THAT IS NOT CONTROLLED WITH YOUR NAUSEA MEDICATION  *UNUSUAL SHORTNESS OF BREATH  *UNUSUAL BRUISING OR BLEEDING  TENDERNESS IN MOUTH AND THROAT WITH OR WITHOUT PRESENCE OF ULCERS  *URINARY PROBLEMS  *BOWEL PROBLEMS  UNUSUAL RASH Items with * indicate a potential emergency and should be followed up as soon as possible.  Feel free to call the clinic you have any questions or concerns. The clinic phone number is [phone].  Please show the [place] at check-in to the Emergency Department and triage nurse.

## 2016-01-06 NOTE — Telephone Encounter (Signed)
per pof to sch pt appt-gave pt copy of avs °

## 2016-01-06 NOTE — Progress Notes (Signed)
NO blood return on check #1 post TPA administration at 1040. 1115 NO blood return on check #2. NO blood return from last two checks within 2 hour post TPA administration by [place] RN arranged for pt to go to [facility] radiology for [facility] dye study. Pt taken to [facility] at 1245. 1420- pt returned from radiology- per Anderson Malta in [place] per MD Benay Spice, [facility] is ok to use to treat today and pt will have a "his PAC revised in the near future to ensure sufficient blood return per [facility].  Pt aware and agrees to proceed today.

## 2016-01-06 NOTE — Procedures (Signed)
Port injection demonstrates the presence of a fibrin sheath about the port-a-catheter tip resulting in difficulty with aspiration. Port is safe for medication administration.  Ronny Bacon, MD Pager #: [phone]

## 2016-01-06 NOTE — Progress Notes (Signed)
[place] OFFICE PROGRESS NOTE   Diagnosis: Colon cancer  INTERVAL HISTORY:   Bryan Fields returns as scheduled. He completed another cycle of FOLFIRI/Avastin on 12/02/2015. He developed severe diarrhea the week following chemotherapy. This lasted for 5 days. Lomotil helped. He had one episode of nausea and vomiting. He reports malaise and somnolence. His chief complaint is pain at the gumline. This has progressed. He has bleeding and has noted "pus "from the gums.  Objective:  Vital signs in last 24 hours:  Blood pressure 135/77, pulse 63, temperature 98.5 F (36.9 C), temperature source Oral, resp. rate 18, height _0  (1.803 m), weight 251 lb 4.8 oz (114 kg), SpO2 100 %.    HEENT: No ulcers or thrush. Erythema at the gums Resp: Lungs clear bilaterally Cardio: Regular rate and rhythm GI: No hepatomegaly, nontender, upper abdominal wall mass measures 6.5 x 6.5 cm in maximum dimension Vascular: No leg edema     Portacath/PICC-without erythema  Lab Results:  Lab Results  Component Value Date   WBC 10.5 (H) 01/06/2016   HGB 14.8 01/06/2016   HCT 44.0 01/06/2016   MCV 102.8 (H) 01/06/2016   PLT 178 01/06/2016   NEUTROABS 7.3 (H) 01/06/2016      Lab Results  Component Value Date   CEA1 3.9 09/30/2015    Imaging: CTs of the chest, abdomen, and pelvis on 12/23/2015-no evidence of disease progression, images reviewed  Medications: I have reviewed the patient's current medications.  Assessment/Plan: 1. 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 Microsatellite stable; preserved expression of major and minor MMR proteins. He began treatment with FOLFOX in December 2011. He completed cycle #12 on 10/27/2010. Oxaliplatin was held with cycles number 7 through 10 and resumed with cycle #11. 2. History of neutropenia secondary to  chemotherapy. 3. History of thrombocytopenia secondary chemotherapy. 4. He did not undergo a preoperative colonoscopy. He underwent a colonoscopy on 12/19/2010 with findings of a 1 cm polyp at 90 cm from the anal verge, minimal polyp at 30 cm from the anal verge and minimal diverticulosis. Colonoscopy 03/18/2012-negative. 5. 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 He began study drug on 05/13/2010. 6. Status post Port-A-Cath placement. The Port-A-Cath has been removed. 7. 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 9. Low attenuation lesion in the caudate lobe of the liver on the CT 04/11/2012-? Cyst. MRI liver on 04/20/2012 favored the caudate lobe liver lesion to represent a cyst or complex cyst. CT abdomen/pelvis 04/17/2013 with continued enlargement of hypovascular lesion in the caudate lobe of the liver.  PET scan 10/27/2011 confirmed a hypermetabolic caudate lobe liver lesion, tiny indeterminate lung nodules, and a 9 mm level II left neck node   CT biopsy of the liver lesion 11/07/2013 confirmed adenocarcinoma  Left liver and caudate resection 01/15/2014-pathology confirmed metastatic colon cancer, and negative surgical margins  Surveillance CT scans 07/24/2014 consistent with recurrent disease involving upper abdominal lymph nodes, peritoneal implants, and an anterior abdominal wall mass  Status post biopsy of the anterior abdominal wall mass 08/06/2014 with pathology showing metastatic adenocarcinoma consistent with a colon primary.  UGT1A1 1/28  Cycle 1 FOLFIRI/Avastin on the [facility] 1317 08/20/2014  Cycle 2 FOLFIRI/Avastin 09/03/2014  Cycle 3 held on 09/17/2014 due to neutropenia  Cycle 3 FOLFIRI/Avastin 09/25/2014.  Neulasta added beginning with cycle 3.  Cycle 4 FOLFIRI/Avastin 10/08/2014  Restaging CT  scans 10/18/2014 with slight interval increase in the size of the soft tissue mass in the subcutaneous fat of the epigastric region. Underlying peritoneal implants, probable focus of residual disease along the resected margin of the liver and hepatoduodenal ligament lymphadenopathy all appeared slightly improved. No new sites of metastatic disease otherwise noted. New left lower lobe bronchopneumonia.  Cycle 5 FOLFIRI/Avastin 10/22/2014  Cycle 6 FOLFIRI/Avastin 11/06/2014  Cycle 7 FOLFIRI Avastin 11/19/2014  Cycle 8 FOLFIRI/Avastin 12/03/2014  CT 12/14/2014 with stable disease  Cycle 9 FOLFIRI/Avastin 12/17/2014  Cycle 10 FOLFIRI/Avastin 01/07/2015 (5-fluorouracil and leucovorin dose reduced)  Cycle 11 FOLFIRI/Avastin 01/21/2015  Cycle 12 FOLFIRI/Avastin 02/04/2015  CT 02/13/2015 with stable disease  Cycle 13 FOLFIRI/Avastin 02/25/2015-changed to an every three-week protocol, now off study  Avastin held with cycle 14 FOLFIRI on 03/25/2015 secondary to gingivitis  Restaging CTs 05/20/2015-slight increase in the size of the epigastric subcutaneous mass, stable peritoneal implants  FOLFIRI continued on a 3 week schedule  Avastin resumed 06/17/2015  CTs 12/23/2015-stable subxiphoid mass, stable low-attenuation mass adjacent to the left liver surgical margin, peritoneal lesions-stable 10. Iron deposition within the liver noted on MRI 04/20/2012. The ferritin level was normal on 04/17/2013. Increased iron deposition noted on the left liver resection 01/15/2014 11. Pain/bleeding at the anus reported when here 04/24/2013. Status post evaluation by Dr. [name] with findings of an anal fissure. 12. Right face cystic lesion 13. Status post Port-A-Cath placement 08/14/2014 14. Delayed nausea following FOLFIRI, prophylactic Decadron added with cycle 2 with improvement. 15. Chest CT 10/18/2014 with new left lower lobe bronchopneumonia. Levaquin initiated. Resolved on CT 12/14/2014 16. Gum  pain- mucositis?, Gingivitis?-Improved with discontinuation of Avastin 17. Pain/tenderness, mild erythema and full appearance over the maxillary sinuses. Maxillofacial CT 01/30/2015 with mild to moderate bilateral maxillary and sphenoid sinus inflammatory changes. Multifocal right maxillary dental periapical lucency. Status post a right upper tooth extraction with resolution of the pain.    Disposition:  Bryan Fields overall status appears unchanged. He had increased diarrhea following the most recent cycle of chemotherapy. He will contact us for diarrhea following this cycle. He will use Lomotil as needed. He would like to change the chemotherapy to an every 4 week schedule.  He has increased pain and inflammation at the gums. He appears to have gingivitis. We decided to place Avastin on hold.  Bryan Fields will return for an office visit and chemotherapy in 4 weeks.  Betsy Coder, MD  01/06/2016  10:54 AM

## 2016-01-06 NOTE — Telephone Encounter (Signed)
per pof to sch pt apppt-gave pt copy of avs-sent Tanya dr Benay Spice nurse email to avd Dr Benay Spice to change trmt plan tio q28 per pt was told by him

## 2016-01-07 LAB — CEA: CEA1: 3 ng/mL (ref 0.0–4.7)

## 2016-01-07 LAB — CEA (PARALLEL TESTING): CEA: 1.9 ng/mL

## 2016-01-08 ENCOUNTER — Ambulatory Visit (HOSPITAL_BASED_OUTPATIENT_CLINIC_OR_DEPARTMENT_OTHER): Payer: 59

## 2016-01-08 ENCOUNTER — Other Ambulatory Visit: Payer: Self-pay | Admitting: Radiology

## 2016-01-08 ENCOUNTER — Ambulatory Visit: Payer: 59

## 2016-01-08 VITALS — BP 139/75 | HR 80 | Temp 97.8°F | Resp 18

## 2016-01-08 DIAGNOSIS — C787 Secondary malignant neoplasm of liver and intrahepatic bile duct: Secondary | ICD-10-CM | POA: Diagnosis not present

## 2016-01-08 DIAGNOSIS — C189 Malignant neoplasm of colon, unspecified: Secondary | ICD-10-CM

## 2016-01-08 DIAGNOSIS — Z5189 Encounter for other specified aftercare: Secondary | ICD-10-CM

## 2016-01-08 DIAGNOSIS — C182 Malignant neoplasm of ascending colon: Secondary | ICD-10-CM | POA: Diagnosis not present

## 2016-01-08 MED ORDER — SODIUM CHLORIDE 0.9 % IJ SOLN
10.0000 mL | INTRAMUSCULAR | Status: DC | PRN
  Administered 2016-01-08: 10 mL
  Filled 2016-01-08: qty 10

## 2016-01-08 MED ORDER — HEPARIN SOD (PORK) LOCK FLUSH 100 UNIT/ML IV SOLN
500.0000 [IU] | Freq: Once | INTRAVENOUS | Status: AC | PRN
  Administered 2016-01-08: 500 [IU]
  Filled 2016-01-08: qty 5

## 2016-01-08 MED ORDER — PEGFILGRASTIM INJECTION 6 MG/0.6ML ~~LOC~~
6.0000 mg | PREFILLED_SYRINGE | Freq: Once | SUBCUTANEOUS | Status: AC
Start: 2016-01-08 — End: 2016-01-08
  Administered 2016-01-08: 6 mg via SUBCUTANEOUS
  Filled 2016-01-08: qty 0.6

## 2016-01-08 NOTE — Progress Notes (Signed)
Neulasta injection given in Flush Room # 1

## 2016-01-08 NOTE — Patient Instructions (Signed)

## 2016-01-09 ENCOUNTER — Encounter (HOSPITAL_COMMUNITY): Payer: Self-pay

## 2016-01-09 ENCOUNTER — Ambulatory Visit (HOSPITAL_COMMUNITY)
Admission: RE | Admit: 2016-01-09 | Discharge: 2016-01-09 | Disposition: A | Discharge status: Home / Self Care | Payer: 59 | Source: Ambulatory Visit | Attending: Interventional Radiology | Admitting: Interventional Radiology

## 2016-01-09 ENCOUNTER — Other Ambulatory Visit (HOSPITAL_COMMUNITY): Payer: Self-pay | Admitting: Interventional Radiology

## 2016-01-09 DIAGNOSIS — C182 Malignant neoplasm of ascending colon: Secondary | ICD-10-CM

## 2016-01-09 DIAGNOSIS — N400 Enlarged prostate without lower urinary tract symptoms: Secondary | ICD-10-CM | POA: Insufficient documentation

## 2016-01-09 DIAGNOSIS — I10 Essential (primary) hypertension: Secondary | ICD-10-CM | POA: Diagnosis not present

## 2016-01-09 DIAGNOSIS — C189 Malignant neoplasm of colon, unspecified: Secondary | ICD-10-CM | POA: Diagnosis not present

## 2016-01-09 DIAGNOSIS — Z95828 Presence of other vascular implants and grafts: Secondary | ICD-10-CM

## 2016-01-09 DIAGNOSIS — C787 Secondary malignant neoplasm of liver and intrahepatic bile duct: Secondary | ICD-10-CM | POA: Insufficient documentation

## 2016-01-09 DIAGNOSIS — Z9221 Personal history of antineoplastic chemotherapy: Secondary | ICD-10-CM | POA: Insufficient documentation

## 2016-01-09 DIAGNOSIS — E7800 Pure hypercholesterolemia, unspecified: Secondary | ICD-10-CM | POA: Diagnosis not present

## 2016-01-09 DIAGNOSIS — N529 Male erectile dysfunction, unspecified: Secondary | ICD-10-CM | POA: Diagnosis not present

## 2016-01-09 DIAGNOSIS — Z452 Encounter for adjustment and management of vascular access device: Secondary | ICD-10-CM | POA: Diagnosis not present

## 2016-01-09 HISTORY — PX: IR GENERIC HISTORICAL: IMG1180011

## 2016-01-09 LAB — PROTIME-INR
INR: 0.92
PROTHROMBIN TIME: 12.3 s (ref 11.4–15.2)

## 2016-01-09 LAB — CBC WITH DIFFERENTIAL/PLATELET
BASOS ABS: 0 10*3/uL (ref 0.0–0.1)
Basophils Relative: 0 %
EOS ABS: 0 10*3/uL (ref 0.0–0.7)
Eosinophils Relative: 0 %
HCT: 43.5 % (ref 39.0–52.0)
HEMOGLOBIN: 14.5 g/dL (ref 13.0–17.0)
LYMPHS PCT: 2 %
Lymphs Abs: 0.7 10*3/uL (ref 0.7–4.0)
MCH: 34.4 pg — ABNORMAL HIGH (ref 26.0–34.0)
MCHC: 33.3 g/dL (ref 30.0–36.0)
MCV: 103.1 fL — ABNORMAL HIGH (ref 78.0–100.0)
Monocytes Absolute: 0.4 10*3/uL (ref 0.1–1.0)
Monocytes Relative: 1 %
NEUTROS PCT: 97 %
Neutro Abs: 34.7 10*3/uL — ABNORMAL HIGH (ref 1.7–7.7)
PLATELETS: 187 10*3/uL (ref 150–400)
RBC: 4.22 MIL/uL (ref 4.22–5.81)
RDW: 13.4 % (ref 11.5–15.5)
WBC: 35.8 10*3/uL — AB (ref 4.0–10.5)

## 2016-01-09 LAB — APTT: APTT: 27 s (ref 24–36)

## 2016-01-09 MED ORDER — FENTANYL CITRATE (PF) 100 MCG/2ML IJ SOLN
INTRAMUSCULAR | Status: AC | PRN
  Administered 2016-01-09: 50 ug via INTRAVENOUS
  Administered 2016-01-09: 25 ug via INTRAVENOUS

## 2016-01-09 MED ORDER — HEPARIN SOD (PORK) LOCK FLUSH 100 UNIT/ML IV SOLN
INTRAVENOUS | Status: AC
  Filled 2016-01-09: qty 5

## 2016-01-09 MED ORDER — MIDAZOLAM HCL 2 MG/2ML IJ SOLN
INTRAMUSCULAR | Status: AC | PRN
  Administered 2016-01-09 (×5): 1 mg via INTRAVENOUS

## 2016-01-09 MED ORDER — FENTANYL CITRATE (PF) 100 MCG/2ML IJ SOLN
INTRAMUSCULAR | Status: AC
  Filled 2016-01-09: qty 4

## 2016-01-09 MED ORDER — LIDOCAINE HCL 1 % IJ SOLN
INTRAMUSCULAR | Status: AC
  Filled 2016-01-09: qty 20

## 2016-01-09 MED ORDER — CEFAZOLIN SODIUM-DEXTROSE 2-4 GM/100ML-% IV SOLN
2.0000 g | Freq: Once | INTRAVENOUS | Status: AC
  Administered 2016-01-09: 2 g via INTRAVENOUS
  Filled 2016-01-09: qty 100

## 2016-01-09 MED ORDER — SODIUM CHLORIDE 0.9 % IV SOLN
INTRAVENOUS | Status: DC
  Administered 2016-01-09: 08:00:00 via INTRAVENOUS

## 2016-01-09 MED ORDER — MIDAZOLAM HCL 2 MG/2ML IJ SOLN
INTRAMUSCULAR | Status: AC
  Filled 2016-01-09: qty 6

## 2016-01-09 MED ORDER — LIDOCAINE HCL 1 % IJ SOLN
INTRAMUSCULAR | Status: AC | PRN
  Administered 2016-01-09: 10 mL via INTRADERMAL
  Administered 2016-01-09: 5 mL via INTRADERMAL

## 2016-01-09 MED ORDER — HEPARIN SOD (PORK) LOCK FLUSH 100 UNIT/ML IV SOLN
INTRAVENOUS | Status: DC | PRN
  Administered 2016-01-09: 500 [IU] via INTRAVENOUS

## 2016-01-09 NOTE — Procedures (Signed)
Interventional Radiology Procedure Note  Procedure:  1.) Placement of a right IJ approach single lumen PowerPort.  Tip is positioned in the RA and catheter is ready for immediate use.  2.) Removal left subclavian port.  Complications: No immediate  Recommendations:  - Ok to shower tomorrow - Do not submerge for 7 days - Routine line care   Signed,  Criselda Peaches, MD

## 2016-01-09 NOTE — Discharge Instructions (Signed)
.Incision Care °An incision is when a surgeon cuts into your body. After surgery, the incision needs to be cared for properly to prevent infection.  °HOW TO CARE FOR YOUR INCISION °· Take medicines only as directed by your health care provider. °· There are many different ways to close and cover an incision, including stitches, skin glue, and adhesive strips. Follow your health care provider's instructions on: °· Incision care. °· Bandage (dressing) changes and removal. °· Incision closure removal. °· Do not take baths, swim, or use a hot tub until your health care provider approves. You may shower as directed by your health care provider. °· Resume your normal diet and activities as directed. °· Use anti-itch medicine (such as an antihistamine) as directed by your health care provider. The incision may itch while it is healing. Do not pick or scratch at the incision. °· Drink enough fluid to keep your urine clear or pale yellow. °SEEK MEDICAL CARE IF:  °· You have drainage, redness, swelling, or pain at your incision site. °· You have muscle aches, chills, or a general ill feeling. °· You notice a bad smell coming from the incision or dressing. °· Your incision edges separate after the sutures, staples, or skin adhesive strips have been removed. °· You have persistent nausea or vomiting. °· You have a fever. °· You are dizzy. °SEEK IMMEDIATE MEDICAL CARE IF:  °· You have a rash. °· You faint. °· You have difficulty breathing. °MAKE SURE YOU:  °· Understand these instructions. °· Will watch your condition. °· Will get help right away if you are not doing well or get worse. °  °This information is not intended to replace advice given to you by your health care provider. Make sure you discuss any questions you have with your health care provider. °  °Document Released: 12/12/2004 Document Revised: 06/15/2014 Document Reviewed: 07/19/2013 °Elsevier Interactive Patient Education ©2016 Elsevier Inc. °Implanted Port Home  Guide °An implanted port is a type of central line that is placed under the skin. Central lines are used to provide IV access when treatment or nutrition needs to be given through a person's veins. Implanted ports are used for long-term IV access. An implanted port may be placed because:  °· You need IV medicine that would be irritating to the small veins in your hands or arms.   °· You need long-term IV medicines, such as antibiotics.   °· You need IV nutrition for a long period.   °· You need frequent blood draws for lab tests.   °· You need dialysis.   °Implanted ports are usually placed in the chest area, but they can also be placed in the upper arm, the abdomen, or the leg. An implanted port has two main parts:  °· Reservoir. The reservoir is round and will appear as a small, raised area under your skin. The reservoir is the part where a needle is inserted to give medicines or draw blood.   °· Catheter. The catheter is a thin, flexible tube that extends from the reservoir. The catheter is placed into a large vein. Medicine that is inserted into the reservoir goes into the catheter and then into the vein.   °HOW WILL I CARE FOR MY INCISION SITE? °Do not get the incision site wet. Bathe or shower as directed by your health care provider.  °HOW IS MY PORT ACCESSED? °Special steps must be taken to access the port:  °· Before the port is accessed, a numbing cream can be placed on the skin.   This helps numb the skin over the port site.   °· Your health care provider uses a sterile technique to access the port. °· Your health care provider must put on a mask and sterile gloves. °· The skin over your port is cleaned carefully with an antiseptic and allowed to dry. °· The port is gently pinched between sterile gloves, and a needle is inserted into the port. °· Only "non-coring" port needles should be used to access the port. Once the port is accessed, a blood return should be checked. This helps ensure that the port is  in the vein and is not clogged.   °· If your port needs to remain accessed for a constant infusion, a clear (transparent) bandage will be placed over the needle site. The bandage and needle will need to be changed every week, or as directed by your health care provider.   °· Keep the bandage covering the needle clean and dry. Do not get it wet. Follow your health care provider's instructions on how to take a shower or bath while the port is accessed.   °· If your port does not need to stay accessed, no bandage is needed over the port.   °WHAT IS FLUSHING? °Flushing helps keep the port from getting clogged. Follow your health care provider's instructions on how and when to flush the port. Ports are usually flushed with saline solution or a medicine called heparin. The need for flushing will depend on how the port is used.  °· If the port is used for intermittent medicines or blood draws, the port will need to be flushed:   °· After medicines have been given.   °· After blood has been drawn.   °· As part of routine maintenance.   °· If a constant infusion is running, the port may not need to be flushed.   °HOW LONG WILL MY PORT STAY IMPLANTED? °The port can stay in for as long as your health care provider thinks it is needed. When it is time for the port to come out, surgery will be done to remove it. The procedure is similar to the one performed when the port was put in.  °WHEN SHOULD I SEEK IMMEDIATE MEDICAL CARE? °When you have an implanted port, you should seek immediate medical care if:  °· You notice a bad smell coming from the incision site.   °· You have swelling, redness, or drainage at the incision site.   °· You have more swelling or pain at the port site or the surrounding area.   °· You have a fever that is not controlled with medicine. °  °This information is not intended to replace advice given to you by your health care provider. Make sure you discuss any questions you have with your health care  provider. °  °Document Released: 05/25/2005 Document Revised: 03/15/2013 Document Reviewed: 01/30/2013 °Elsevier Interactive Patient Education ©2016 Elsevier Inc[employer] °Implanted Port Insertion, Care After °Refer to this sheet in the next few weeks. These instructions provide you with information on caring for yourself after your procedure. Your health care provider may also give you more specific instructions. Your treatment has been planned according to current medical practices, but problems sometimes occur. Call your health care provider if you have any problems or questions after your procedure. °WHAT TO EXPECT AFTER THE PROCEDURE °After your procedure, it is typical to have the following:  °· Discomfort at the port insertion site. Ice packs to the area will help. °· Bruising on the skin over the port. This will subside in 3-4   days. °HOME CARE INSTRUCTIONS °· After your port is placed, you will get a manufacturer's information card. The card has information about your port. Keep this card with you at all times.   °· Know what kind of port you have. There are many types of ports available.   °· Wear a medical alert bracelet in case of an emergency. This can help alert health care workers that you have a port.   °· The port can stay in for as long as your health care provider believes it is necessary.   °· A home health care nurse may give medicines and take care of the port.   °· You or a family member can get special training and directions for giving medicine and taking care of the port at home.   °SEEK MEDICAL CARE IF:  °· Your port does not flush or you are unable to get a blood return.   °· You have a fever or chills. °SEEK IMMEDIATE MEDICAL CARE IF: °· You have new fluid or pus coming from your incision.   °· You notice a bad smell coming from your incision site.   °· You have swelling, pain, or more redness at the incision or port site.   °· You have chest pain or shortness of breath. °  °This information is  not intended to replace advice given to you by your health care provider. Make sure you discuss any questions you have with your health care provider. °  °Document Released: 03/15/2013 Document Revised: 05/30/2013 Document Reviewed: 03/15/2013 °Elsevier Interactive Patient Education ©2016 Elsevier Inc. ° °Moderate Conscious Sedation, Adult, Care After °Refer to this sheet in the next few weeks. These instructions provide you with information on caring for yourself after your procedure. Your health care provider may also give you more specific instructions. Your treatment has been planned according to current medical practices, but problems sometimes occur. Call your health care provider if you have any problems or questions after your procedure. °WHAT TO EXPECT AFTER THE PROCEDURE  °After your procedure: °· You may feel sleepy, clumsy, and have poor balance for several hours. °· Vomiting may occur if you eat too soon after the procedure. °HOME CARE INSTRUCTIONS °· Do not participate in any activities where you could become injured for at least 24 hours. Do not: °¨ Drive. °¨ Swim. °¨ Ride a bicycle. °¨ Operate heavy machinery. °¨ Cook. °¨ Use power tools. °¨ Climb ladders. °¨ Work from a high place. °· Do not make important decisions or sign legal documents until you are improved. °· If you vomit, drink water, juice, or soup when you can drink without vomiting. Make sure you have little or no nausea before eating solid foods. °· Only take over-the-counter or prescription medicines for pain, discomfort, or fever as directed by your health care provider. °· Make sure you and your family fully understand everything about the medicines given to you, including what side effects may occur. °· You should not drink alcohol, take sleeping pills, or take medicines that cause drowsiness for at least 24 hours. °· If you smoke, do not smoke without supervision. °· If you are feeling better, you may resume normal activities 24  hours after you were sedated. °· Keep all appointments with your health care provider. °SEEK MEDICAL CARE IF: °· Your skin is pale or bluish in color. °· You continue to feel nauseous or vomit. °· Your pain is getting worse and is not helped by medicine. °· You have bleeding or swelling. °· You are still sleepy or feeling   clumsy after 24 hours. °SEEK IMMEDIATE MEDICAL CARE IF: °· You develop a rash. °· You have difficulty breathing. °· You develop any type of allergic problem. °· You have a fever. °MAKE SURE YOU: °· Understand these instructions. °· Will watch your condition. °· Will get help right away if you are not doing well or get worse. °  °This information is not intended to replace advice given to you by your health care provider. Make sure you discuss any questions you have with your health care provider. °  °Document Released: 03/15/2013 Document Revised: 06/15/2014 Document Reviewed: 03/15/2013 °Elsevier Interactive Patient Education ©2016 Elsevier Inc. ° °
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
## 2016-01-27 ENCOUNTER — Ambulatory Visit: Payer: 59 | Admitting: Oncology

## 2016-01-27 ENCOUNTER — Ambulatory Visit: Payer: 59

## 2016-01-27 ENCOUNTER — Other Ambulatory Visit: Payer: 59

## 2016-01-29 ENCOUNTER — Ambulatory Visit: Payer: 59

## 2016-02-02 ENCOUNTER — Other Ambulatory Visit: Payer: Self-pay | Admitting: Oncology

## 2016-02-03 ENCOUNTER — Telehealth: Payer: Self-pay | Admitting: Oncology

## 2016-02-03 ENCOUNTER — Ambulatory Visit (HOSPITAL_BASED_OUTPATIENT_CLINIC_OR_DEPARTMENT_OTHER): Payer: 59 | Admitting: Nurse Practitioner

## 2016-02-03 ENCOUNTER — Ambulatory Visit: Payer: 59

## 2016-02-03 ENCOUNTER — Other Ambulatory Visit (HOSPITAL_BASED_OUTPATIENT_CLINIC_OR_DEPARTMENT_OTHER): Payer: 59

## 2016-02-03 ENCOUNTER — Ambulatory Visit (HOSPITAL_BASED_OUTPATIENT_CLINIC_OR_DEPARTMENT_OTHER): Payer: 59

## 2016-02-03 ENCOUNTER — Encounter: Payer: Self-pay | Admitting: *Deleted

## 2016-02-03 VITALS — BP 139/74 | HR 57 | Temp 98.7°F | Resp 18 | Ht 71.0 in | Wt 250.1 lb

## 2016-02-03 DIAGNOSIS — C787 Secondary malignant neoplasm of liver and intrahepatic bile duct: Secondary | ICD-10-CM

## 2016-02-03 DIAGNOSIS — C182 Malignant neoplasm of ascending colon: Secondary | ICD-10-CM

## 2016-02-03 DIAGNOSIS — Z5111 Encounter for antineoplastic chemotherapy: Secondary | ICD-10-CM

## 2016-02-03 DIAGNOSIS — Z95828 Presence of other vascular implants and grafts: Secondary | ICD-10-CM

## 2016-02-03 DIAGNOSIS — C189 Malignant neoplasm of colon, unspecified: Secondary | ICD-10-CM

## 2016-02-03 LAB — COMPREHENSIVE METABOLIC PANEL
ALT: 18 U/L (ref 0–55)
ANION GAP: 8 meq/L (ref 3–11)
AST: 21 U/L (ref 5–34)
Albumin: 3.3 g/dL — ABNORMAL LOW (ref 3.5–5.0)
Alkaline Phosphatase: 45 U/L (ref 40–150)
BILIRUBIN TOTAL: 0.48 mg/dL (ref 0.20–1.20)
BUN: 16.1 mg/dL (ref 7.0–26.0)
CHLORIDE: 109 meq/L (ref 98–109)
CO2: 25 meq/L (ref 22–29)
Calcium: 9.1 mg/dL (ref 8.4–10.4)
Creatinine: 1 mg/dL (ref 0.7–1.3)
EGFR: 85 mL/min/{1.73_m2} — ABNORMAL LOW (ref >90)
GLUCOSE: 101 mg/dL (ref 70–140)
Potassium: 4.2 mEq/L (ref 3.5–5.1)
SODIUM: 142 meq/L (ref 136–145)
TOTAL PROTEIN: 6.4 g/dL (ref 6.4–8.3)

## 2016-02-03 LAB — CBC WITH DIFFERENTIAL/PLATELET
BASO%: 0.4 % (ref 0.0–2.0)
Basophils Absolute: 0 10e3/uL (ref 0.0–0.1)
EOS%: 2 % (ref 0.0–7.0)
Eosinophils Absolute: 0.2 10e3/uL (ref 0.0–0.5)
HCT: 39.6 % (ref 38.4–49.9)
HGB: 13.4 g/dL (ref 13.0–17.1)
LYMPH%: 21.2 % (ref 14.0–49.0)
MCH: 34 pg — ABNORMAL HIGH (ref 27.2–33.4)
MCHC: 33.8 g/dL (ref 32.0–36.0)
MCV: 100.6 fL — ABNORMAL HIGH (ref 79.3–98.0)
MONO#: 0.8 10e3/uL (ref 0.1–0.9)
MONO%: 10.7 % (ref 0.0–14.0)
NEUT#: 5.1 10e3/uL (ref 1.5–6.5)
NEUT%: 65.7 % (ref 39.0–75.0)
Platelets: 237 10e3/uL (ref 140–400)
RBC: 3.94 10e6/uL — ABNORMAL LOW (ref 4.20–5.82)
RDW: 14.7 % — ABNORMAL HIGH (ref 11.0–14.6)
WBC: 7.8 10e3/uL (ref 4.0–10.3)
lymph#: 1.7 10e3/uL (ref 0.9–3.3)

## 2016-02-03 LAB — UA PROTEIN, DIPSTICK - CHCC: Protein, ur: NEGATIVE mg/dL

## 2016-02-03 LAB — CEA (IN HOUSE-CHCC): CEA (CHCC-IN HOUSE): 2.48 ng/mL (ref 0.00–5.00)

## 2016-02-03 MED ORDER — PALONOSETRON HCL INJECTION 0.25 MG/5ML
0.2500 mg | Freq: Once | INTRAVENOUS | Status: AC
  Administered 2016-02-03: 0.25 mg via INTRAVENOUS

## 2016-02-03 MED ORDER — PALONOSETRON HCL INJECTION 0.25 MG/5ML
INTRAVENOUS | Status: AC
  Filled 2016-02-03: qty 5

## 2016-02-03 MED ORDER — SODIUM CHLORIDE 0.9 % IV SOLN
Freq: Once | INTRAVENOUS | Status: AC
  Administered 2016-02-03: 12:00:00 via INTRAVENOUS
  Filled 2016-02-03: qty 5

## 2016-02-03 MED ORDER — SODIUM CHLORIDE 0.9 % IV SOLN
Freq: Once | INTRAVENOUS | Status: AC
  Administered 2016-02-03: 12:00:00 via INTRAVENOUS

## 2016-02-03 MED ORDER — ATROPINE SULFATE 1 MG/ML IJ SOLN
0.5000 mg | Freq: Once | INTRAMUSCULAR | Status: AC | PRN
  Administered 2016-02-03: 0.5 mg via INTRAVENOUS

## 2016-02-03 MED ORDER — DEXTROSE 5 % IV SOLN
180.0000 mg/m2 | Freq: Once | INTRAVENOUS | Status: AC
  Administered 2016-02-03: 420 mg via INTRAVENOUS
  Filled 2016-02-03: qty 15

## 2016-02-03 MED ORDER — ATROPINE SULFATE 1 MG/ML IJ SOLN
INTRAMUSCULAR | Status: AC
  Filled 2016-02-03: qty 1

## 2016-02-03 MED ORDER — FLUOROURACIL CHEMO INJECTION 2.5 GM/50ML
240.0000 mg/m2 | Freq: Once | INTRAVENOUS | Status: AC
  Administered 2016-02-03: 550 mg via INTRAVENOUS
  Filled 2016-02-03: qty 11

## 2016-02-03 MED ORDER — LEUCOVORIN CALCIUM INJECTION 350 MG
240.0000 mg/m2 | Freq: Once | INTRAMUSCULAR | Status: AC
  Administered 2016-02-03: 562 mg via INTRAVENOUS
  Filled 2016-02-03: qty 28.1

## 2016-02-03 MED ORDER — FLUOROURACIL CHEMO INJECTION 5 GM/100ML
1600.0000 mg/m2 | INTRAVENOUS | Status: DC
  Administered 2016-02-03: 3750 mg via INTRAVENOUS
  Filled 2016-02-03: qty 75

## 2016-02-03 NOTE — Progress Notes (Signed)
Oncology Nurse Navigator Documentation  Oncology Nurse Navigator Flowsheets 02/03/2016  Navigator Location [facility]Med Onc  Navigator Encounter Type Treatment  Patient Visit Type MedOnc  Treatment Phase Active Tx  Barriers/Navigation Needs Financial  Interventions Other--provided info on how to apply for ss disability and medicare benefits  Support Groups/Services GI Support Group;Other--[facility] Support Calendar  Acuity Level 1  Time Spent with Patient 15

## 2016-02-03 NOTE — Patient Instructions (Signed)
[facility] Discharge Instructions for Patients Receiving Chemotherapy  Today you received the following chemotherapy agents:  Irinotecan, Leucovorin, Fluorouracil  To help prevent nausea and vomiting after your treatment, we encourage you to take your nausea medication as prescribed.   If you develop nausea and vomiting that is not controlled by your nausea medication, call the clinic.   BELOW ARE SYMPTOMS THAT SHOULD BE REPORTED IMMEDIATELY:  *FEVER GREATER THAN 100.5 F  *CHILLS WITH OR WITHOUT FEVER  NAUSEA AND VOMITING THAT IS NOT CONTROLLED WITH YOUR NAUSEA MEDICATION  *UNUSUAL SHORTNESS OF BREATH  *UNUSUAL BRUISING OR BLEEDING  TENDERNESS IN MOUTH AND THROAT WITH OR WITHOUT PRESENCE OF ULCERS  *URINARY PROBLEMS  *BOWEL PROBLEMS  UNUSUAL RASH Items with * indicate a potential emergency and should be followed up as soon as possible.  Feel free to call the clinic you have any questions or concerns. The clinic phone number is [phone].  Please show the CHEMO ALERT CARD at check-in to the Emergency Department and triage nurse.   

## 2016-02-03 NOTE — Telephone Encounter (Signed)
GAVE RELATIVE AVS REPORT AND APPOINTMENTS FOR [facility] October.

## 2016-02-03 NOTE — Progress Notes (Signed)
[facility] OFFICE PROGRESS NOTE   Diagnosis: Colon cancer   INTERVAL HISTORY:   Bryan Fields returns as scheduled. He completed another cycle of FOLFIRI 01/06/2016. Avastin was held. He denies nausea/vomiting. No diarrhea. Last week he developed gum tenderness and bleeding. No other bleeding. Abdominal mass is stable. He reports blurring of vision centrally involving the right eye for the past "few months". No diplopia.  Objective:  Vital signs in last 24 hours:  Blood pressure 139/74, pulse (!) 57, temperature 98.7 F (37.1 C), temperature source Oral, resp. rate 18, height 5\' 11"  (1.803 m), weight 250 lb 1.6 oz (113.4 kg), SpO2 100 %.    HEENT: No ulcers or thrush. Gums appear erythematous. Pupils equal round and reactive to light. Extraocular movements intact. Resp: Lungs clear bilaterally. Cardio: Regular rate and rhythm. GI: Abdomen is soft and nontender. No hepatomegaly. Upper abdominal wall mass measures 6 x 7 cm. Vascular: No leg edema.    Lab Results:  Lab Results  Component Value Date   WBC 7.8 02/03/2016   HGB 13.4 02/03/2016   HCT 39.6 02/03/2016   MCV 100.6 (H) 02/03/2016   PLT 237 02/03/2016   NEUTROABS 5.1 02/03/2016    Imaging:  No results found.  Medications: I have reviewed the patient's current medications.  Assessment/Plan: 1. 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 Microsatellite stable; preserved expression of major and minor MMR proteins. He began treatment with FOLFOX in December 2011. He completed cycle #12 on 10/27/2010. Oxaliplatin was held with cycles number 7 through 10 and resumed with cycle #11. 2. History of neutropenia secondary to chemotherapy. 3. History of thrombocytopenia secondary chemotherapy. 4. He did not undergo a preoperative colonoscopy. He underwent a colonoscopy on 12/19/2010 with findings of a 1  cm polyp at 90 cm from the anal verge, minimal polyp at 30 cm from the anal verge and minimal diverticulosis. Colonoscopy 03/18/2012-negative. 5. 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 He began study drug on 05/13/2010. 6. Status post Port-A-Cath placement. The Port-A-Cath has been removed. 7. 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 9. Low attenuation lesion in the caudate lobe of the liver on the CT 04/11/2012-? Cyst. MRI liver on 04/20/2012 favored the caudate lobe liver lesion to represent a cyst or complex cyst. CT abdomen/pelvis 04/17/2013 with continued enlargement of hypovascular lesion in the caudate lobe of the liver.  PET scan 10/27/2011 confirmed a hypermetabolic caudate lobe liver lesion, tiny indeterminate lung nodules, and a 9 mm level II left neck node   CT biopsy of the liver lesion 11/07/2013 confirmed adenocarcinoma  Left liver and caudate resection 01/15/2014-pathology confirmed metastatic colon cancer, and negative surgical margins  Surveillance CT scans 07/24/2014 consistent with recurrent disease involving upper abdominal lymph nodes, peritoneal implants, and an anterior abdominal wall mass  Status post biopsy of the anterior abdominal wall mass 08/06/2014 with pathology showing metastatic adenocarcinoma consistent with a colon primary.  UGT1A1 1/28  Cycle 1 FOLFIRI/Avastin on the [facility] 1317 08/20/2014  Cycle 2 FOLFIRI/Avastin 09/03/2014  Cycle 3 held on 09/17/2014 due to neutropenia  Cycle 3 FOLFIRI/Avastin 09/25/2014. Neulasta added beginning with cycle 3.  Cycle 4 FOLFIRI/Avastin 10/08/2014  Restaging CT scans 10/18/2014 with slight interval increase in the size of the soft tissue mass in the subcutaneous  fat of the epigastric region. Underlying peritoneal implants, probable focus of  residual disease along the resected margin of the liver and hepatoduodenal ligament lymphadenopathy all appeared slightly improved. No new sites of metastatic disease otherwise noted. New left lower lobe bronchopneumonia.  Cycle 5 FOLFIRI/Avastin 10/22/2014  Cycle 6 FOLFIRI/Avastin 11/06/2014  Cycle 7 FOLFIRI Avastin 11/19/2014  Cycle 8 FOLFIRI/Avastin 12/03/2014  CT 12/14/2014 with stable disease  Cycle 9 FOLFIRI/Avastin 12/17/2014  Cycle 10 FOLFIRI/Avastin 01/07/2015 (5-fluorouracil and leucovorin dose reduced)  Cycle 11 FOLFIRI/Avastin 01/21/2015  Cycle 12 FOLFIRI/Avastin 02/04/2015  CT 02/13/2015 with stable disease  Cycle 13 FOLFIRI/Avastin 02/25/2015-changed to an every three-week protocol, now off study  Avastin held with cycle 14 FOLFIRI on 03/25/2015 secondary to gingivitis  Restaging CTs 05/20/2015-slight increase in the size of the epigastric subcutaneous mass, stable peritoneal implants  FOLFIRI continued on a 3 week schedule  Avastin resumed 06/17/2015  CTs 12/23/2015-stable subxiphoid mass, stable low-attenuation mass adjacent to the left liver surgical margin, peritoneal lesions-stable  FOLFIRI continued on a 4 week schedule.  Avastin placed on hold 01/06/2016 10. Iron deposition within the liver noted on MRI 04/20/2012. The ferritin level was normal on 04/17/2013. Increased iron deposition noted on the left liver resection 01/15/2014 11. Pain/bleeding at the anus reported when here 04/24/2013. Status post evaluation by Dr. [name] with findings of an anal fissure. 12. Right face cystic lesion 13. Status post Port-A-Cath placement 08/14/2014 14. Delayed nausea following FOLFIRI, prophylactic Decadron added with cycle 2 with improvement. 15. Chest CT 10/18/2014 with new left lower lobe bronchopneumonia. Levaquin initiated. Resolved on CT 12/14/2014 16. Gum pain-mucositis?, Gingivitis?-Improved with discontinuation of Avastin 17. Pain/tenderness, mild erythema  and full appearance over the maxillary sinuses. Maxillofacial CT 01/30/2015 with mild to moderate bilateral maxillary and sphenoid sinus inflammatory changes. Multifocal right maxillary dental periapical lucency. Status post a right upper tooth extraction with resolution of the pain.   Disposition: Bryan Fields appears stable. Plan to proceed with FOLFIRI today as scheduled. We will continue to hold Avastin.  At today's visit he reports a several month history of blurred vision involving the right eye. He will contact his ophthalmologist for an appointment.  He will return for a follow-up visit and FOLFIRI in 4 weeks. He will contact the office in the interim with any problems.    Ned Card ANP/GNP-BC   02/03/2016  11:03 AM

## 2016-02-04 LAB — CEA: CEA: 3.4 ng/mL (ref 0.0–4.7)

## 2016-02-05 ENCOUNTER — Ambulatory Visit (HOSPITAL_BASED_OUTPATIENT_CLINIC_OR_DEPARTMENT_OTHER): Payer: 59

## 2016-02-05 ENCOUNTER — Ambulatory Visit: Payer: 59

## 2016-02-05 VITALS — BP 147/83 | HR 72 | Temp 98.6°F | Resp 18

## 2016-02-05 DIAGNOSIS — C182 Malignant neoplasm of ascending colon: Secondary | ICD-10-CM

## 2016-02-05 DIAGNOSIS — C189 Malignant neoplasm of colon, unspecified: Secondary | ICD-10-CM

## 2016-02-05 DIAGNOSIS — Z5189 Encounter for other specified aftercare: Secondary | ICD-10-CM

## 2016-02-05 DIAGNOSIS — C787 Secondary malignant neoplasm of liver and intrahepatic bile duct: Secondary | ICD-10-CM | POA: Diagnosis not present

## 2016-02-05 MED ORDER — HEPARIN SOD (PORK) LOCK FLUSH 100 UNIT/ML IV SOLN
500.0000 [IU] | Freq: Once | INTRAVENOUS | Status: AC | PRN
  Administered 2016-02-05: 500 [IU]
  Filled 2016-02-05: qty 5

## 2016-02-05 MED ORDER — PEGFILGRASTIM INJECTION 6 MG/0.6ML ~~LOC~~
6.0000 mg | PREFILLED_SYRINGE | Freq: Once | SUBCUTANEOUS | Status: AC
  Administered 2016-02-05: 6 mg via SUBCUTANEOUS
  Filled 2016-02-05: qty 0.6

## 2016-02-05 MED ORDER — SODIUM CHLORIDE 0.9 % IJ SOLN
10.0000 mL | INTRAMUSCULAR | Status: DC | PRN
  Administered 2016-02-05: 10 mL
  Filled 2016-02-05: qty 10

## 2016-02-05 NOTE — Patient Instructions (Signed)

## 2016-03-01 ENCOUNTER — Other Ambulatory Visit: Payer: Self-pay | Admitting: Oncology

## 2016-03-02 ENCOUNTER — Other Ambulatory Visit (HOSPITAL_BASED_OUTPATIENT_CLINIC_OR_DEPARTMENT_OTHER): Payer: 59

## 2016-03-02 ENCOUNTER — Ambulatory Visit (HOSPITAL_BASED_OUTPATIENT_CLINIC_OR_DEPARTMENT_OTHER): Payer: 59 | Admitting: Oncology

## 2016-03-02 ENCOUNTER — Ambulatory Visit: Payer: 59

## 2016-03-02 VITALS — BP 137/67 | HR 81 | Temp 98.1°F | Resp 17 | Ht 71.0 in | Wt 249.6 lb

## 2016-03-02 DIAGNOSIS — Z95828 Presence of other vascular implants and grafts: Secondary | ICD-10-CM

## 2016-03-02 DIAGNOSIS — C182 Malignant neoplasm of ascending colon: Secondary | ICD-10-CM | POA: Diagnosis not present

## 2016-03-02 DIAGNOSIS — C7989 Secondary malignant neoplasm of other specified sites: Secondary | ICD-10-CM | POA: Diagnosis not present

## 2016-03-02 DIAGNOSIS — Z23 Encounter for immunization: Secondary | ICD-10-CM

## 2016-03-02 DIAGNOSIS — C787 Secondary malignant neoplasm of liver and intrahepatic bile duct: Secondary | ICD-10-CM

## 2016-03-02 DIAGNOSIS — C183 Malignant neoplasm of hepatic flexure: Secondary | ICD-10-CM | POA: Diagnosis not present

## 2016-03-02 LAB — COMPREHENSIVE METABOLIC PANEL
ALBUMIN: 3.3 g/dL — AB (ref 3.5–5.0)
ALT: 25 U/L (ref 0–55)
ANION GAP: 8 meq/L (ref 3–11)
AST: 23 U/L (ref 5–34)
Alkaline Phosphatase: 56 U/L (ref 40–150)
BUN: 17.7 mg/dL (ref 7.0–26.0)
CALCIUM: 9 mg/dL (ref 8.4–10.4)
CO2: 26 mEq/L (ref 22–29)
CREATININE: 1 mg/dL (ref 0.7–1.3)
Chloride: 108 mEq/L (ref 98–109)
EGFR: 82 mL/min/{1.73_m2} — AB (ref >90)
Glucose: 119 mg/dl (ref 70–140)
Potassium: 3.9 mEq/L (ref 3.5–5.1)
Sodium: 142 mEq/L (ref 136–145)
TOTAL PROTEIN: 6.5 g/dL (ref 6.4–8.3)
Total Bilirubin: 0.45 mg/dL (ref 0.20–1.20)

## 2016-03-02 LAB — CBC WITH DIFFERENTIAL/PLATELET
BASO%: 0.6 % (ref 0.0–2.0)
Basophils Absolute: 0 10*3/uL (ref 0.0–0.1)
EOS%: 2.2 % (ref 0.0–7.0)
Eosinophils Absolute: 0.2 10*3/uL (ref 0.0–0.5)
HCT: 41.3 % (ref 38.4–49.9)
HGB: 13.9 g/dL (ref 13.0–17.1)
LYMPH%: 19.8 % (ref 14.0–49.0)
MCH: 34 pg — ABNORMAL HIGH (ref 27.2–33.4)
MCHC: 33.7 g/dL (ref 32.0–36.0)
MCV: 101 fL — ABNORMAL HIGH (ref 79.3–98.0)
MONO#: 0.6 10*3/uL (ref 0.1–0.9)
MONO%: 8.2 % (ref 0.0–14.0)
NEUT%: 69.2 % (ref 39.0–75.0)
NEUTROS ABS: 5.2 10*3/uL (ref 1.5–6.5)
Platelets: 280 10*3/uL (ref 140–400)
RBC: 4.09 10*6/uL — AB (ref 4.20–5.82)
RDW: 15.2 % — AB (ref 11.0–14.6)
WBC: 7.5 10*3/uL (ref 4.0–10.3)
lymph#: 1.5 10*3/uL (ref 0.9–3.3)

## 2016-03-02 LAB — UA PROTEIN, DIPSTICK - CHCC: PROTEIN: NEGATIVE mg/dL

## 2016-03-02 MED ORDER — SODIUM CHLORIDE 0.9 % IJ SOLN
10.0000 mL | INTRAMUSCULAR | Status: DC | PRN
  Administered 2016-03-02: 10 mL via INTRAVENOUS
  Filled 2016-03-02: qty 10

## 2016-03-02 MED ORDER — INFLUENZA VAC SPLIT QUAD 0.5 ML IM SUSY
0.5000 mL | PREFILLED_SYRINGE | Freq: Once | INTRAMUSCULAR | Status: AC
  Administered 2016-03-02: 0.5 mL via INTRAMUSCULAR
  Filled 2016-03-02: qty 0.5

## 2016-03-02 MED ORDER — HEPARIN SOD (PORK) LOCK FLUSH 100 UNIT/ML IV SOLN
500.0000 [IU] | Freq: Once | INTRAVENOUS | Status: AC | PRN
  Administered 2016-03-02: 500 [IU] via INTRAVENOUS
  Filled 2016-03-02: qty 5

## 2016-03-02 NOTE — Patient Instructions (Signed)

## 2016-03-02 NOTE — Progress Notes (Signed)
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  MM

## 2016-03-02 NOTE — Progress Notes (Signed)
GI Location of Tumor / Histology: Colon cancer  GEO SLONE' FOLFIRI was discontinued 03/02/2016 secondary to enlargement of the abdominal wall mass with new superficial fungating nodules involving the skin    Past/Anticipated interventions by surgeon, if any: no  Past/Anticipated interventions by medical oncology, if any:  1. 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 Microsatellite stable; preserved expression of major and minor MMR proteins. He began treatment with FOLFOX in December 2011. He completed cycle #12 on 10/27/2010. Oxaliplatin was held with cycles number 7 through 10 and resumed with cycle #11. 2. History of neutropenia secondary to chemotherapy. 3. History of thrombocytopenia secondary chemotherapy. 4. He did not undergo a preoperative colonoscopy. He underwent a colonoscopy on 12/19/2010 with findings of a 1 cm polyp at 90 cm from the anal verge, minimal polyp at 30 cm from the anal verge and minimal diverticulosis. Colonoscopy 03/18/2012-negative. 5. 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 He began study drug on 05/13/2010. 6. Status post Port-A-Cath placement. The Port-A-Cath has been removed. 7. 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 9. Low attenuation lesion in the caudate lobe of the liver on the CT 04/11/2012-? Cyst. MRI liver on 04/20/2012 favored the caudate lobe liver lesion to represent a cyst or complex cyst. CT abdomen/pelvis 04/17/2013 with continued enlargement of hypovascular lesion in the caudate lobe of the liver.  PET scan 10/27/2011 confirmed a hypermetabolic caudate lobe liver lesion, tiny indeterminate lung nodules, and a 9 mm level II left neck node   CT biopsy of the  liver lesion 11/07/2013 confirmed adenocarcinoma  Left liver and caudate resection 01/15/2014-pathology confirmed metastatic colon cancer, and negative surgical margins  Surveillance CT scans 07/24/2014 consistent with recurrent disease involving upper abdominal lymph nodes, peritoneal implants, and an anterior abdominal wall mass  Status post biopsy of the anterior abdominal wall mass 08/06/2014 with pathology showing metastatic adenocarcinoma consistent with a colon primary.  UGT1A1 1/28  Cycle 1 FOLFIRI/Avastin on the [facility] 1317 08/20/2014  Cycle 2 FOLFIRI/Avastin 09/03/2014  Cycle 3 held on 09/17/2014 due to neutropenia  Cycle 3 FOLFIRI/Avastin 09/25/2014. Neulasta added beginning with cycle 3.  Cycle 4 FOLFIRI/Avastin 10/08/2014  Restaging CT scans 10/18/2014 with slight interval increase in the size of the soft tissue mass in the subcutaneous fat of the epigastric region. Underlying peritoneal implants, probable focus of residual disease along the resected margin of the liver and hepatoduodenal ligament lymphadenopathy all appeared slightly improved. No new sites of metastatic disease otherwise noted. New left lower lobe bronchopneumonia.  Cycle 5 FOLFIRI/Avastin 10/22/2014  Cycle 6 FOLFIRI/Avastin 11/06/2014  Cycle 7 FOLFIRI Avastin 11/19/2014  Cycle 8 FOLFIRI/Avastin 12/03/2014  CT 12/14/2014 with stable disease  Cycle 9 FOLFIRI/Avastin 12/17/2014  Cycle 10 FOLFIRI/Avastin 01/07/2015 (5-fluorouracil and leucovorin dose reduced)  Cycle 11 FOLFIRI/Avastin 01/21/2015  Cycle 12 FOLFIRI/Avastin 02/04/2015  CT 02/13/2015 with stable disease  Cycle 13 FOLFIRI/Avastin 02/25/2015-changed to an every three-week protocol, now off study  Avastin held with cycle 14 FOLFIRI on 03/25/2015 secondary to gingivitis  Restaging CTs 05/20/2015-slight increase in the size of the epigastric subcutaneous mass, stable peritoneal implants  FOLFIRI continued on a 3 week  schedule  Avastin resumed 06/17/2015  CTs 12/23/2015-stable subxiphoid mass, stable low-attenuation mass adjacent to the left liver surgical margin, peritoneal lesions-stable  FOLFIRI continued on  a 4 week schedule.  Avastin placed on hold 01/06/2016  FOLFIRI discontinued 03/02/2016 secondary to enlargement of the abdominal wall mass with new superficial fungating nodules involving the skin 10. Iron deposition within the liver noted on MRI 04/20/2012. The ferritin level was normal on 04/17/2013. Increased iron deposition noted on the left liver resection 01/15/2014 11. Pain/bleeding at the anus reported when here 04/24/2013. Status post evaluation by Dr. [name] with findings of an anal fissure. 12. Right face cystic lesion 13. Status post Port-A-Cath placement 08/14/2014 14. Delayed nausea following FOLFIRI, prophylactic Decadron added with cycle 2 with improvement. 15. Chest CT 10/18/2014 with new left lower lobe bronchopneumonia. Levaquin initiated. Resolved on CT 12/14/2014 16. Gum pain-mucositis?, Gingivitis?-Improved with discontinuation of Avastin 17. Pain/tenderness, mild erythema and full appearance over the maxillary sinuses. Maxillofacial CT 01/30/2015 with mild to moderate bilateral maxillary and sphenoid sinus inflammatory changes. Multifocal right maxillary dental periapical lucency. Status post a right upper tooth extraction with resolution of the pain.  Weight changes, if any: No. Good appetite.  Bowel/Bladder complaints, if any: He denies. He reports having diarrhea with his chemotherapy. He manages that with lomotil when needed.   Nausea / Vomiting, if any: He denies  Pain issues, if any:  Gum pain continues.  Upper Abdominal mass more uncomfortable. Lower Abdomen area pain has improved.   Any blood per rectum:  No   SAFETY ISSUES:  Prior radiation? No  Pacemaker/ICD? No  Possible current pregnancy? no  Is the patient on methotrexate? no  Current  Complaints/Details: 55 year old male.   BP 140/86   Pulse 84   Temp 98.9 F (37.2 C)   Ht '5\' 11"'$  (1.803 m)   Wt 248 lb 8 oz (112.7 kg)   SpO2 100% Comment: room air  BMI 34.66 kg/m    Wt Readings from Last 3 Encounters:  03/03/16 248 lb 8 oz (112.7 kg)  03/02/16 249 lb 9.6 oz (113.2 kg)  02/03/16 250 lb 1.6 oz (113.4 kg)

## 2016-03-02 NOTE — Progress Notes (Signed)
[place] OFFICE PROGRESS NOTE   Diagnosis: Colon cancer  INTERVAL HISTORY:   Bryan Fields returns as scheduled. He completed another cycle of FOLFIRI 02/03/2016. He did not have abdominal pain following chemotherapy. He continues to have gum pain and bleeding. He has a "cold sore "at the lower lip. The abdominal wall mass is more uncomfortable and he is developing nodularity at the skin surface. Good appetite.  Objective:  Vital signs in last 24 hours:  Blood pressure 137/67, pulse 81, temperature 98.1 F (36.7 C), temperature source Oral, resp. rate 17, height '5\' 11"'$  (1.803 m), weight 249 lb 9.6 oz (113.2 kg), SpO2 100 %.    HEENT: Bleeding at the mid upper gumline, no thrush or ulcers Lymphatics: No cervical or supra-clavicular nodes Resp: Lungs clear bilaterally Cardio: Regular rate and rhythm GI: No hepatomegaly, abdominal wall mass measures 7.5 cm in maximum transverse dimension and 7 cm vertically. There are superficial nodular components to the main mass Vascular: No leg edema   Portacath/PICC-without erythema  Lab Results:  Lab Results  Component Value Date   WBC 7.5 03/02/2016   HGB 13.9 03/02/2016   HCT 41.3 03/02/2016   MCV 101.0 (H) 03/02/2016   PLT 280 03/02/2016   NEUTROABS 5.2 03/02/2016      Lab Results  Component Value Date   CEA1 3.4 02/03/2016     Medications: I have reviewed the patient's current medications.  1. 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 Microsatellite stable; preserved expression of major and minor MMR proteins. He began treatment with FOLFOX in December 2011. He completed cycle #12 on 10/27/2010. Oxaliplatin was held with cycles number 7 through 10 and resumed with cycle #11. 2. History of neutropenia secondary to chemotherapy. 3. History of thrombocytopenia secondary chemotherapy. 4. He did not  undergo a preoperative colonoscopy. He underwent a colonoscopy on 12/19/2010 with findings of a 1 cm polyp at 90 cm from the anal verge, minimal polyp at 30 cm from the anal verge and minimal diverticulosis. Colonoscopy 03/18/2012-negative. 5. 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 He began study drug on 05/13/2010. 6. Status post Port-A-Cath placement. The Port-A-Cath has been removed. 7. 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 9. Low attenuation lesion in the caudate lobe of the liver on the CT 04/11/2012-? Cyst. MRI liver on 04/20/2012 favored the caudate lobe liver lesion to represent a cyst or complex cyst. CT abdomen/pelvis 04/17/2013 with continued enlargement of hypovascular lesion in the caudate lobe of the liver.  PET scan 10/27/2011 confirmed a hypermetabolic caudate lobe liver lesion, tiny indeterminate lung nodules, and a 9 mm level II left neck node   CT biopsy of the liver lesion 11/07/2013 confirmed adenocarcinoma  Left liver and caudate resection 01/15/2014-pathology confirmed metastatic colon cancer, and negative surgical margins  Surveillance CT scans 07/24/2014 consistent with recurrent disease involving upper abdominal lymph nodes, peritoneal implants, and an anterior abdominal wall mass  Status post biopsy of the anterior abdominal wall mass 08/06/2014 with pathology showing metastatic adenocarcinoma consistent with a colon primary.  UGT1A1 1/28  Cycle 1 FOLFIRI/Avastin on the [facility] 1317 08/20/2014  Cycle 2 FOLFIRI/Avastin 09/03/2014  Cycle 3 held on 09/17/2014 due to neutropenia  Cycle 3 FOLFIRI/Avastin 09/25/2014. Neulasta added beginning with cycle 3.  Cycle 4 FOLFIRI/Avastin  10/08/2014  Restaging CT scans 10/18/2014 with slight interval increase in the size of the soft tissue mass in  the subcutaneous fat of the epigastric region. Underlying peritoneal implants, probable focus of residual disease along the resected margin of the liver and hepatoduodenal ligament lymphadenopathy all appeared slightly improved. No new sites of metastatic disease otherwise noted. New left lower lobe bronchopneumonia.  Cycle 5 FOLFIRI/Avastin 10/22/2014  Cycle 6 FOLFIRI/Avastin 11/06/2014  Cycle 7 FOLFIRI Avastin 11/19/2014  Cycle 8 FOLFIRI/Avastin 12/03/2014  CT 12/14/2014 with stable disease  Cycle 9 FOLFIRI/Avastin 12/17/2014  Cycle 10 FOLFIRI/Avastin 01/07/2015 (5-fluorouracil and leucovorin dose reduced)  Cycle 11 FOLFIRI/Avastin 01/21/2015  Cycle 12 FOLFIRI/Avastin 02/04/2015  CT 02/13/2015 with stable disease  Cycle 13 FOLFIRI/Avastin 02/25/2015-changed to an every three-week protocol, now off study  Avastin held with cycle 14 FOLFIRI on 03/25/2015 secondary to gingivitis  Restaging CTs 05/20/2015-slight increase in the size of the epigastric subcutaneous mass, stable peritoneal implants  FOLFIRI continued on a 3 week schedule  Avastin resumed 06/17/2015  CTs 12/23/2015-stable subxiphoid mass, stable low-attenuation mass adjacent to the left liver surgical margin, peritoneal lesions-stable  FOLFIRI continued on a 4 week schedule.  Avastin placed on hold 01/06/2016  FOLFIRI discontinued 03/02/2016 secondary to enlargement of the abdominal wall mass with new superficial fungating nodules involving the skin 10. Iron deposition within the liver noted on MRI 04/20/2012. The ferritin level was normal on 04/17/2013. Increased iron deposition noted on the left liver resection 01/15/2014 11. Pain/bleeding at the anus reported when here 04/24/2013. Status post evaluation by Dr. [name] with findings of an anal fissure. 12. Right face cystic lesion 13. Status post Port-A-Cath placement 08/14/2014 14. Delayed nausea following FOLFIRI, prophylactic Decadron added with cycle 2 with  improvement. 15. Chest CT 10/18/2014 with new left lower lobe bronchopneumonia. Levaquin initiated. Resolved on CT 12/14/2014 16. Gum pain-mucositis?, Gingivitis?-Improved with discontinuation of Avastin 17. Pain/tenderness, mild erythema and full appearance over the maxillary sinuses. Maxillofacial CT 01/30/2015 with mild to moderate bilateral maxillary and sphenoid sinus inflammatory changes. Multifocal right maxillary dental periapical lucency. Status post a right upper tooth extraction with resolution of the pain.     Disposition:  His overall status appears unchanged. The abdominal wall mass measures larger today and there is now skin involvement. We decided to discontinue FOLFIRI. He will be referred for palliative radiation to the abdominal wall mass.  Bryan Fields has been treated with full./Avastin since March 2016. He was treated with FOLFOX in the adjuvant setting. His tumor has a K-ras mutation. He appears to have progression of the abdominal wall mass. Systemic treatment options are limited. We can consider treatment with Lonsurf Or oxaliplatin based therapy. I will refer him to [facility] to consider clinical trial eligibility.  He will return for an office visit after the completion of palliative radiation.  Betsy Coder, MD  03/02/2016  9:34 AM

## 2016-03-03 ENCOUNTER — Encounter: Payer: Self-pay | Admitting: Radiation Oncology

## 2016-03-03 ENCOUNTER — Ambulatory Visit
Admission: RE | Admit: 2016-03-03 | Discharge: 2016-03-03 | Disposition: A | Discharge status: Home / Self Care | Payer: 59 | Source: Ambulatory Visit | Attending: Radiation Oncology | Admitting: Radiation Oncology

## 2016-03-03 DIAGNOSIS — C183 Malignant neoplasm of hepatic flexure: Secondary | ICD-10-CM | POA: Insufficient documentation

## 2016-03-03 DIAGNOSIS — C182 Malignant neoplasm of ascending colon: Secondary | ICD-10-CM

## 2016-03-03 DIAGNOSIS — Z510 Encounter for antineoplastic radiation therapy: Secondary | ICD-10-CM | POA: Insufficient documentation

## 2016-03-03 DIAGNOSIS — R222 Localized swelling, mass and lump, trunk: Secondary | ICD-10-CM
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
## 2016-03-04 ENCOUNTER — Ambulatory Visit: Payer: 59

## 2016-03-04 ENCOUNTER — Ambulatory Visit
Admission: RE | Admit: 2016-03-04 | Discharge: 2016-03-04 | Disposition: A | Discharge status: Home / Self Care | Payer: 59 | Source: Ambulatory Visit | Attending: Radiation Oncology | Admitting: Radiation Oncology

## 2016-03-04 DIAGNOSIS — C183 Malignant neoplasm of hepatic flexure: Secondary | ICD-10-CM

## 2016-03-04 DIAGNOSIS — Z510 Encounter for antineoplastic radiation therapy: Secondary | ICD-10-CM | POA: Diagnosis not present

## 2016-03-04 NOTE — Progress Notes (Signed)
  Radiation Oncology         [phone] ________________________________  Name: Bryan Fields MRN: [id number]  Date: 03/04/2016  DOB: Dec 03, 1960  SIMULATION AND TREATMENT PLANNING NOTE    ICD-9-CM ICD-10-CM   1. Malignant neoplasm of hepatic flexure (HCC) 153.0 C18.3     DIAGNOSIS:  55 yo man with a subcutaneous abdominal wall metastasis from stage IV colon cancer  NARRATIVE:  The patient was brought to the [place].  Identity was confirmed.  All relevant records and images related to the planned course of therapy were reviewed.  The patient freely provided informed written consent to proceed with treatment after reviewing the details related to the planned course of therapy. The consent form was witnessed and verified by the simulation staff.  Then, the patient was set-up in a stable reproducible  supine position for radiation therapy.  CT images were obtained.  Surface markings were placed.  The CT images were loaded into the planning software.  Then the target and avoidance structures were contoured.  Treatment planning then occurred.  The radiation prescription was entered and confirmed.  Then, I designed and supervised the construction of a total of one bodyFix medically necessary complex treatment devices.  I have requested : Isodose Plan.  I have ordered:Nutrition Consult  PLAN:  The patient will receive 30 Gy in 10 fractions.  ________________________________  Sheral Apley Tammi Klippel, M.D.

## 2016-03-06 ENCOUNTER — Telehealth: Payer: Self-pay | Admitting: *Deleted

## 2016-03-06 DIAGNOSIS — Z510 Encounter for antineoplastic radiation therapy: Secondary | ICD-10-CM | POA: Diagnosis not present

## 2016-03-06 NOTE — Telephone Encounter (Signed)
"  I'm a patient of Dr. Johny Shears.  Can you transfer me down there."  Call transferred.  Received voicemail ext [phone].

## 2016-03-09 ENCOUNTER — Telehealth: Payer: Self-pay | Admitting: Oncology

## 2016-03-09 ENCOUNTER — Ambulatory Visit
Admission: RE | Admit: 2016-03-09 | Discharge: 2016-03-09 | Disposition: A | Discharge status: Home / Self Care | Payer: 59 | Source: Ambulatory Visit | Attending: Radiation Oncology | Admitting: Radiation Oncology

## 2016-03-09 DIAGNOSIS — Z510 Encounter for antineoplastic radiation therapy: Secondary | ICD-10-CM | POA: Diagnosis not present

## 2016-03-09 NOTE — Telephone Encounter (Signed)
CALLED PT LEFT VM IN REF TO APPT WITH DR MCREE @ [facility] 03/25/16@9 :30

## 2016-03-09 NOTE — Telephone Encounter (Signed)
FAXED PT [place] ID [id number]

## 2016-03-10 ENCOUNTER — Ambulatory Visit
Admission: RE | Admit: 2016-03-10 | Discharge: 2016-03-10 | Disposition: A | Discharge status: Home / Self Care | Payer: 59 | Source: Ambulatory Visit | Attending: Radiation Oncology | Admitting: Radiation Oncology

## 2016-03-10 DIAGNOSIS — Z510 Encounter for antineoplastic radiation therapy: Secondary | ICD-10-CM | POA: Diagnosis not present

## 2016-03-11 ENCOUNTER — Ambulatory Visit
Admission: RE | Admit: 2016-03-11 | Discharge: 2016-03-11 | Disposition: A | Discharge status: Home / Self Care | Payer: 59 | Source: Ambulatory Visit | Attending: Radiation Oncology | Admitting: Radiation Oncology

## 2016-03-11 DIAGNOSIS — Z510 Encounter for antineoplastic radiation therapy: Secondary | ICD-10-CM | POA: Diagnosis not present

## 2016-03-12 ENCOUNTER — Ambulatory Visit
Admission: RE | Admit: 2016-03-12 | Discharge: 2016-03-12 | Disposition: A | Discharge status: Home / Self Care | Payer: 59 | Source: Ambulatory Visit | Attending: Radiation Oncology | Admitting: Radiation Oncology

## 2016-03-12 DIAGNOSIS — Z510 Encounter for antineoplastic radiation therapy: Secondary | ICD-10-CM | POA: Diagnosis not present

## 2016-03-13 ENCOUNTER — Ambulatory Visit
Admission: RE | Admit: 2016-03-13 | Discharge: 2016-03-13 | Disposition: A | Discharge status: Home / Self Care | Payer: 59 | Source: Ambulatory Visit | Attending: Radiation Oncology | Admitting: Radiation Oncology

## 2016-03-13 ENCOUNTER — Encounter: Payer: Self-pay | Admitting: Radiation Oncology

## 2016-03-13 VITALS — BP 125/66 | HR 66 | Temp 98.4°F | Resp 20 | Wt 247.4 lb

## 2016-03-13 DIAGNOSIS — Z510 Encounter for antineoplastic radiation therapy: Secondary | ICD-10-CM | POA: Insufficient documentation

## 2016-03-13 DIAGNOSIS — Z923 Personal history of irradiation: Secondary | ICD-10-CM | POA: Insufficient documentation

## 2016-03-13 DIAGNOSIS — C182 Malignant neoplasm of ascending colon: Secondary | ICD-10-CM | POA: Diagnosis present

## 2016-03-13 MED ORDER — RADIAPLEXRX EX GEL
Freq: Once | CUTANEOUS | Status: AC
  Administered 2016-03-13: 10:00:00 via TOPICAL

## 2016-03-13 NOTE — Progress Notes (Signed)
Weekly rad txs 5/10 completed abdomen, no c/o nausea, diarrhea, gas or any bladder problems, abdomen slight pink, itchy gave radiapelx gel  To use after rad daily and bedtime, appetite good, energy level good, no c/o pain 8:29 AM BP 125/66 (BP Location: Left Arm, Patient Position: Sitting, Cuff Size: Large)   Pulse 66   Temp 98.4 F (36.9 C) (Oral)   Resp 20   Wt 247 lb 6.4 oz (112.2 kg)   BMI 34.51 kg/m   Wt Readings from Last 3 Encounters:  03/13/16 247 lb 6.4 oz (112.2 kg)  03/03/16 248 lb 8 oz (112.7 kg)  03/02/16 249 lb 9.6 oz (113.2 kg)

## 2016-03-13 NOTE — Progress Notes (Signed)
  Radiation Oncology         [phone]   Name: Bryan Fields MRN: [id number]   Date: 03/13/2016  DOB: 01/09/1961   Weekly Radiation Therapy Management    ICD-9-CM ICD-10-CM   1. Malignant neoplasm of ascending colon (HCC) 153.6 C18.2 hyaluronate sodium (RADIAPLEXRX) gel    Current Dose: 15 Gy  Planned Dose:  30 Gy  Narrative The patient presents for routine under treatment assessment.  Weekly rad txs 5/10 completed abdomen. No c/o nausea, pain, diarrhea, gas, or any bladder problems. Abdomen slight pink and itchy. Good appetite and energy level.  Set-up films were reviewed. The chart was checked.  Physical Findings  weight is 247 lb 6.4 oz (112.2 kg). His oral temperature is 98.4 F (36.9 C). His blood pressure is 125/66 and his pulse is 66. His respiration is 20.   Notable tumor on abdominal wall has not significantly broken down. Mild erythema without desquamation.  Impression The patient is tolerating radiation.  Plan Continue treatment as planned. Radiaplex was provided for the patient.         Sheral Apley Tammi Klippel, M.D.

## 2016-03-16 ENCOUNTER — Ambulatory Visit
Admission: RE | Admit: 2016-03-16 | Discharge: 2016-03-16 | Disposition: A | Discharge status: Home / Self Care | Payer: 59 | Source: Ambulatory Visit | Attending: Radiation Oncology | Admitting: Radiation Oncology

## 2016-03-16 DIAGNOSIS — Z510 Encounter for antineoplastic radiation therapy: Secondary | ICD-10-CM | POA: Diagnosis not present

## 2016-03-17 ENCOUNTER — Ambulatory Visit
Admission: RE | Admit: 2016-03-17 | Discharge: 2016-03-17 | Disposition: A | Discharge status: Home / Self Care | Payer: 59 | Source: Ambulatory Visit | Attending: Radiation Oncology | Admitting: Radiation Oncology

## 2016-03-17 DIAGNOSIS — Z510 Encounter for antineoplastic radiation therapy: Secondary | ICD-10-CM | POA: Diagnosis not present

## 2016-03-18 ENCOUNTER — Ambulatory Visit
Admission: RE | Admit: 2016-03-18 | Discharge: 2016-03-18 | Disposition: A | Discharge status: Home / Self Care | Payer: 59 | Source: Ambulatory Visit | Attending: Radiation Oncology | Admitting: Radiation Oncology

## 2016-03-18 ENCOUNTER — Telehealth: Payer: Self-pay | Admitting: Oncology

## 2016-03-18 DIAGNOSIS — Z510 Encounter for antineoplastic radiation therapy: Secondary | ICD-10-CM | POA: Diagnosis not present

## 2016-03-18 NOTE — Telephone Encounter (Signed)
R/S PT APPT. WITH DR MCREE  04/01/16@2 :00

## 2016-03-19 ENCOUNTER — Ambulatory Visit
Admission: RE | Admit: 2016-03-19 | Discharge: 2016-03-19 | Disposition: A | Discharge status: Home / Self Care | Payer: 59 | Source: Ambulatory Visit | Attending: Radiation Oncology | Admitting: Radiation Oncology

## 2016-03-19 DIAGNOSIS — Z510 Encounter for antineoplastic radiation therapy: Secondary | ICD-10-CM | POA: Diagnosis not present

## 2016-03-20 ENCOUNTER — Ambulatory Visit
Admission: RE | Admit: 2016-03-20 | Discharge: 2016-03-20 | Disposition: A | Discharge status: Home / Self Care | Payer: 59 | Source: Ambulatory Visit | Attending: Radiation Oncology | Admitting: Radiation Oncology

## 2016-03-20 ENCOUNTER — Encounter: Payer: Self-pay | Admitting: Radiation Oncology

## 2016-03-20 VITALS — BP 133/82 | HR 62 | Resp 16 | Wt 249.2 lb

## 2016-03-20 DIAGNOSIS — Z510 Encounter for antineoplastic radiation therapy: Secondary | ICD-10-CM | POA: Diagnosis not present

## 2016-03-20 DIAGNOSIS — C183 Malignant neoplasm of hepatic flexure: Secondary | ICD-10-CM

## 2016-03-20 NOTE — Progress Notes (Signed)
  Radiation Oncology         [phone]   Name: Bryan Fields MRN: [id number]   Date: 03/20/2016  DOB: November 20, 1960   Weekly Radiation Therapy Management    ICD-9-CM ICD-10-CM   1. Malignant neoplasm of hepatic flexure (HCC) 153.0 C18.3     Current Dose: 30 Gy  Planned Dose:  30 Gy  Narrative The patient presents for routine under treatment assessment.  Weight and vitals stable. Denies pain. Nursing notes hyperpigmentation without desquamation of abdomen noted. Reports using radiaplex as directed. Nursing advised continued use of radiaplex bid for the next two weeks. Denies nausea, vomiting, or gas. Denies dysuria or hematuria. Reports diarrhea managed with script from Dr. Benay Spice. Reports a "good" normal appetite. Denies fatigue. One month follow up appointment card given by nursing. Patient understands to contact this RN with needs.   The patient is without complaint. Set-up films were reviewed. The chart was checked.  Physical Findings  weight is 249 lb 3.2 oz (113 kg). His blood pressure is 133/82 and his pulse is 62. His respiration is 16 and oxygen saturation is 100%. . Weight essentially stable.  Cutaneous met is somewhat softer with no skin breakdown  Impression The patient is tolerating radiation.  Plan End of treatment, patient will follow up in a month. In the meantime the patient will follow up with [facility] in two weeks to discuss treatment protocols.         Sheral Apley Tammi Klippel, M.D.  This document serves as a record of services personally performed by Tyler Pita, MD. It was created on his behalf by Maryla Morrow, a trained medical scribe. The creation of this record is based on the scribe's personal observations and the provider's statements to them. This document has been checked and approved by the attending provider.

## 2016-03-20 NOTE — Progress Notes (Signed)
Weight and vitals stable. Denies pain. Hyperpigmentation without desquamation of abdomen noted. Reports using radiaplex as directed. Advised continued use of radiaplex bid for the next two weeks. Denies nausea, vomiting, or gas. Denies dysuria or hematuria. Reports diarrhea managed with script from Dr. Benay Spice. Reports a "good" normal appetite. Denies fatigue. One month follow up appointment card given. Patient understands to contact this RN with needs.   BP 133/82 (BP Location: Left Arm, Patient Position: Sitting, Cuff Size: Large)   Pulse 62   Resp 16   Wt 249 lb 3.2 oz (113 kg)   SpO2 100%   BMI 34.76 kg/m  Wt Readings from Last 3 Encounters:  03/20/16 249 lb 3.2 oz (113 kg)  03/13/16 247 lb 6.4 oz (112.2 kg)  03/03/16 248 lb 8 oz (112.7 kg)

## 2016-03-25 ENCOUNTER — Telehealth: Payer: Self-pay | Admitting: Oncology

## 2016-03-25 NOTE — Telephone Encounter (Signed)
Faxed pt records to [facility] [phone]

## 2016-03-26 NOTE — Progress Notes (Signed)
  Radiation Oncology         [phone] ________________________________  Name: Bryan Fields MRN: [id number]  Date: 03/20/2016  DOB: April 23, 1961  End of Treatment Note  Diagnosis:   55 y.o. gentleman with recurrent metastatic stage IIIB, pT2N1b, adenocarcinoma of the right colon with disease in the anterior abdominal wall     Indication for treatment:  Palliative       Radiation treatment dates:   03/09/2016 to 03/20/2016  Site/dose:   The abdominal wall mass was treated to 30 Gy in 10 fractions at 3 Gy per fraction.   Beams/energy:   Isodose plan // 6X  Narrative: The patient tolerated radiation treatment relatively well.  The patient experienced diarrhea which was managed with medication. On the last day of treatment, his cutaneous met was somewhat softer with no skin breakdown.  Plan: The patient has completed radiation treatment. The patient will return to radiation oncology clinic for routine followup in one month. In the meantime the patient will follow up with [facility] in two weeks  To discuss treatment protocols. I advised him to call or return sooner if he has any questions or concerns related to his recovery or treatment. ________________________________  Sheral Apley. Tammi Klippel, M.D.   This document serves as a record of services personally performed by Tyler Pita, MD. It was created on his behalf by Arlyce Harman, a trained medical scribe. The creation of this record is based on the scribe's personal observations and the provider's statements to them. This document has been checked and approved by the attending provider.

## 2016-03-29 ENCOUNTER — Other Ambulatory Visit: Payer: Self-pay | Admitting: Oncology

## 2016-03-30 ENCOUNTER — Ambulatory Visit: Payer: 59

## 2016-03-30 ENCOUNTER — Ambulatory Visit (HOSPITAL_BASED_OUTPATIENT_CLINIC_OR_DEPARTMENT_OTHER): Payer: 59 | Admitting: Oncology

## 2016-03-30 ENCOUNTER — Telehealth: Payer: Self-pay | Admitting: Oncology

## 2016-03-30 ENCOUNTER — Other Ambulatory Visit (HOSPITAL_BASED_OUTPATIENT_CLINIC_OR_DEPARTMENT_OTHER): Payer: 59

## 2016-03-30 VITALS — BP 126/77 | HR 60 | Temp 98.4°F | Resp 18 | Wt 246.9 lb

## 2016-03-30 DIAGNOSIS — C182 Malignant neoplasm of ascending colon: Secondary | ICD-10-CM | POA: Diagnosis not present

## 2016-03-30 DIAGNOSIS — C787 Secondary malignant neoplasm of liver and intrahepatic bile duct: Secondary | ICD-10-CM

## 2016-03-30 DIAGNOSIS — R200 Anesthesia of skin: Secondary | ICD-10-CM | POA: Diagnosis not present

## 2016-03-30 DIAGNOSIS — Z95828 Presence of other vascular implants and grafts: Secondary | ICD-10-CM

## 2016-03-30 DIAGNOSIS — R1909 Other intra-abdominal and pelvic swelling, mass and lump: Secondary | ICD-10-CM

## 2016-03-30 LAB — CBC WITH DIFFERENTIAL/PLATELET
BASO%: 0.5 % (ref 0.0–2.0)
BASOS ABS: 0 10*3/uL (ref 0.0–0.1)
EOS%: 3.2 % (ref 0.0–7.0)
Eosinophils Absolute: 0.2 10*3/uL (ref 0.0–0.5)
HEMATOCRIT: 45 % (ref 38.4–49.9)
HEMOGLOBIN: 15 g/dL (ref 13.0–17.1)
LYMPH#: 1.1 10*3/uL (ref 0.9–3.3)
LYMPH%: 17.8 % (ref 14.0–49.0)
MCH: 33.3 pg (ref 27.2–33.4)
MCHC: 33.4 g/dL (ref 32.0–36.0)
MCV: 99.7 fL — ABNORMAL HIGH (ref 79.3–98.0)
MONO#: 0.6 10*3/uL (ref 0.1–0.9)
MONO%: 9.4 % (ref 0.0–14.0)
NEUT#: 4.2 10*3/uL (ref 1.5–6.5)
NEUT%: 69.1 % (ref 39.0–75.0)
PLATELETS: 192 10*3/uL (ref 140–400)
RBC: 4.51 10*6/uL (ref 4.20–5.82)
RDW: 14 % (ref 11.0–14.6)
WBC: 6.1 10*3/uL (ref 4.0–10.3)

## 2016-03-30 LAB — COMPREHENSIVE METABOLIC PANEL
ALBUMIN: 3.5 g/dL (ref 3.5–5.0)
ALK PHOS: 62 U/L (ref 40–150)
ALT: 26 U/L (ref 0–55)
AST: 24 U/L (ref 5–34)
Anion Gap: 7 mEq/L (ref 3–11)
BILIRUBIN TOTAL: 0.8 mg/dL (ref 0.20–1.20)
BUN: 18.5 mg/dL (ref 7.0–26.0)
CALCIUM: 9.1 mg/dL (ref 8.4–10.4)
CO2: 25 mEq/L (ref 22–29)
CREATININE: 1.1 mg/dL (ref 0.7–1.3)
Chloride: 106 mEq/L (ref 98–109)
EGFR: 77 mL/min/{1.73_m2} — ABNORMAL LOW (ref >90)
Glucose: 111 mg/dl (ref 70–140)
Potassium: 4.3 mEq/L (ref 3.5–5.1)
Sodium: 138 mEq/L (ref 136–145)
Total Protein: 7 g/dL (ref 6.4–8.3)

## 2016-03-30 LAB — CEA (IN HOUSE-CHCC): CEA (CHCC-IN HOUSE): 1.36 ng/mL (ref 0.00–5.00)

## 2016-03-30 MED ORDER — SODIUM CHLORIDE 0.9 % IJ SOLN
10.0000 mL | INTRAMUSCULAR | Status: DC | PRN
  Administered 2016-03-30: 10 mL via INTRAVENOUS
  Filled 2016-03-30: qty 10

## 2016-03-30 MED ORDER — HEPARIN SOD (PORK) LOCK FLUSH 100 UNIT/ML IV SOLN
500.0000 [IU] | Freq: Once | INTRAVENOUS | Status: AC | PRN
  Administered 2016-03-30: 500 [IU] via INTRAVENOUS
  Filled 2016-03-30: qty 5

## 2016-03-30 NOTE — Progress Notes (Signed)
[facility] OFFICE PROGRESS NOTE   Diagnosis: Colon cancer  INTERVAL HISTORY:   Mr. Dugar returns as scheduled. He completed palliative radiation to the abdominal wall mass on 03/20/2016. He reports one day of diarrhea during the course of radiation. The gum bleeding is partially improved. Good appetite. He reports numbness in the hands when he wakes up at night. No other neurologic symptoms. The numbness resolves with movement. No neck pain.  Objective:  Vital signs in last 24 hours:  Blood pressure 126/77, pulse 60, temperature 98.4 F (36.9 C), temperature source Oral, resp. rate 18, weight 246 lb 14.4 oz (112 kg), SpO2 100 %.    HEENT: No thrush Resp: Lungs clear bilaterally Cardio: Regular rate and rhythm GI: Fungating abdominal wall mass measuring 7 cm in maximum transverse dimension and 6.57 m vertically Vascular: No leg edema Neuro: The arm and hand strength is intact bilaterally  Skin: Mild radiation erythema at the upper abdominal wall   Portacath/PICC-without erythema  Lab Results:  Lab Results  Component Value Date   WBC 6.1 03/30/2016   HGB 15.0 03/30/2016   HCT 45.0 03/30/2016   MCV 99.7 (H) 03/30/2016   PLT 192 03/30/2016   NEUTROABS 4.2 03/30/2016     Medications: I have reviewed the patient's current medications.  Assessment/Plan: 1. 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 Microsatellite stable; preserved expression of major and minor MMR proteins. He began treatment with FOLFOX in December 2011. He completed cycle #12 on 10/27/2010. Oxaliplatin was held with cycles number 7 through 10 and resumed with cycle #11. 2. History of neutropenia secondary to chemotherapy. 3. History of thrombocytopenia secondary chemotherapy. 4. He did not undergo a preoperative colonoscopy. He underwent a colonoscopy on 12/19/2010 with findings of  a 1 cm polyp at 90 cm from the anal verge, minimal polyp at 30 cm from the anal verge and minimal diverticulosis. Colonoscopy 03/18/2012-negative. 5. 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 He began study drug on 05/13/2010. 6. Status post Port-A-Cath placement. The Port-A-Cath has been removed. 7. 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 9. Low attenuation lesion in the caudate lobe of the liver on the CT 04/11/2012-? Cyst. MRI liver on 04/20/2012 favored the caudate lobe liver lesion to represent a cyst or complex cyst. CT abdomen/pelvis 04/17/2013 with continued enlargement of hypovascular lesion in the caudate lobe of the liver.  PET scan 10/27/2011 confirmed a hypermetabolic caudate lobe liver lesion, tiny indeterminate lung nodules, and a 9 mm level II left neck node   CT biopsy of the liver lesion 11/07/2013 confirmed adenocarcinoma  Left liver and caudate resection 01/15/2014-pathology confirmed metastatic colon cancer, and negative surgical margins  Surveillance CT scans 07/24/2014 consistent with recurrent disease involving upper abdominal lymph nodes, peritoneal implants, and an anterior abdominal wall mass  Status post biopsy of the anterior abdominal wall mass 08/06/2014 with pathology showing metastatic adenocarcinoma consistent with a colon primary.  UGT1A1 1/28  Cycle 1 FOLFIRI/Avastin on the [facility] 1317 08/20/2014  Cycle 2 FOLFIRI/Avastin 09/03/2014  Cycle 3 held on 09/17/2014 due to neutropenia  Cycle 3 FOLFIRI/Avastin 09/25/2014. Neulasta added beginning with cycle 3.  Cycle 4 FOLFIRI/Avastin 10/08/2014  Restaging CT scans 10/18/2014 with slight interval increase in the size of the soft tissue mass in the subcutaneous  fat of the epigastric region. Underlying peritoneal implants, probable focus of  residual disease along the resected margin of the liver and hepatoduodenal ligament lymphadenopathy all appeared slightly improved. No new sites of metastatic disease otherwise noted. New left lower lobe bronchopneumonia.  Cycle 5 FOLFIRI/Avastin 10/22/2014  Cycle 6 FOLFIRI/Avastin 11/06/2014  Cycle 7 FOLFIRI Avastin 11/19/2014  Cycle 8 FOLFIRI/Avastin 12/03/2014  CT 12/14/2014 with stable disease  Cycle 9 FOLFIRI/Avastin 12/17/2014  Cycle 10 FOLFIRI/Avastin 01/07/2015 (5-fluorouracil and leucovorin dose reduced)  Cycle 11 FOLFIRI/Avastin 01/21/2015  Cycle 12 FOLFIRI/Avastin 02/04/2015  CT 02/13/2015 with stable disease  Cycle 13 FOLFIRI/Avastin 02/25/2015-changed to an every three-week protocol, now off study  Avastin held with cycle 14 FOLFIRI on 03/25/2015 secondary to gingivitis  Restaging CTs 05/20/2015-slight increase in the size of the epigastric subcutaneous mass, stable peritoneal implants  FOLFIRI continued on a 3 week schedule  Avastin resumed 06/17/2015  CTs 12/23/2015-stable subxiphoid mass, stable low-attenuation mass adjacent to the left liver surgical margin, peritoneal lesions-stable  FOLFIRI continued on a 4 week schedule.  Avastin placed on hold 01/06/2016  FOLFIRI discontinued 03/02/2016 secondary to enlargement of the abdominal wall mass with new superficial fungating nodules involving the skin  Palliative radiation to the abdominal wall mass completed 03/20/2016 10. Iron deposition within the liver noted on MRI 04/20/2012. The ferritin level was normal on 04/17/2013. Increased iron deposition noted on the left liver resection 01/15/2014 11. Pain/bleeding at the anus reported when here 04/24/2013. Status post evaluation by Dr. [name] with findings of an anal fissure. 12. Right face cystic lesion 13. Status post Port-A-Cath placement 08/14/2014 14. Delayed nausea following FOLFIRI, prophylactic Decadron added with cycle 2 with improvement. 15. Chest  CT 10/18/2014 with new left lower lobe bronchopneumonia. Levaquin initiated. Resolved on CT 12/14/2014 16. Gum pain-mucositis?, Gingivitis?-Improved with discontinuation of Avastin 17. Pain/tenderness, mild erythema and full appearance over the maxillary sinuses. Maxillofacial CT 01/30/2015 with mild to moderate bilateral maxillary and sphenoid sinus inflammatory changes. Multifocal right maxillary dental periapical lucency. Status post a right upper tooth extraction with resolution of the pain.    Disposition:  Mr. Rosenberg appears stable. There is a persistent nodular/fungating upper abdominal wall mass. The mass has not changed significantly in size over the past month. He will see Dr. Sigurd Sos at [facility] on 04/01/2016 to discuss clinical trial options and standard therapy.  He will return for an office visit here in 2 weeks. He will contact us for persistence of the hand numbness or new neurologic symptoms. The hand numbness is most likely unrelated to the diagnosis of colon cancer.    Betsy Coder, MD  03/30/2016  11:03 AM

## 2016-03-30 NOTE — Telephone Encounter (Signed)
GAVE PATIENT AVS REPORT AND APPOINTMENTS FOR November.  °

## 2016-03-31 LAB — CEA: CEA1: 1.7 ng/mL (ref 0.0–4.7)

## 2016-04-01 ENCOUNTER — Ambulatory Visit: Payer: 59

## 2016-04-05 ENCOUNTER — Other Ambulatory Visit: Payer: Self-pay | Admitting: Family Medicine

## 2016-04-05 DIAGNOSIS — E669 Obesity, unspecified: Secondary | ICD-10-CM

## 2016-04-05 DIAGNOSIS — E7800 Pure hypercholesterolemia, unspecified: Secondary | ICD-10-CM

## 2016-04-05 DIAGNOSIS — Z1159 Encounter for screening for other viral diseases: Secondary | ICD-10-CM

## 2016-04-06 ENCOUNTER — Other Ambulatory Visit (INDEPENDENT_AMBULATORY_CARE_PROVIDER_SITE_OTHER): Payer: 59

## 2016-04-06 DIAGNOSIS — E669 Obesity, unspecified: Secondary | ICD-10-CM | POA: Diagnosis not present

## 2016-04-06 DIAGNOSIS — Z1159 Encounter for screening for other viral diseases: Secondary | ICD-10-CM

## 2016-04-06 DIAGNOSIS — E7800 Pure hypercholesterolemia, unspecified: Secondary | ICD-10-CM

## 2016-04-06 LAB — LIPID PANEL
CHOLESTEROL: 126 mg/dL (ref 0–200)
HDL: 35.5 mg/dL — ABNORMAL LOW (ref >39.00)
LDL CALC: 68 mg/dL (ref 0–99)
NonHDL: 90.27
TRIGLYCERIDES: 112 mg/dL (ref 0.0–149.0)
Total CHOL/HDL Ratio: 4
VLDL: 22.4 mg/dL (ref 0.0–40.0)

## 2016-04-06 LAB — TSH: TSH: 2.24 u[IU]/mL (ref 0.35–4.50)

## 2016-04-07 ENCOUNTER — Other Ambulatory Visit: Payer: Self-pay | Admitting: Family Medicine

## 2016-04-07 LAB — HEPATITIS C ANTIBODY: HCV AB: NEGATIVE

## 2016-04-13 ENCOUNTER — Encounter: Payer: 59 | Admitting: Family Medicine

## 2016-04-15 ENCOUNTER — Telehealth: Payer: Self-pay | Admitting: Oncology

## 2016-04-15 ENCOUNTER — Ambulatory Visit (HOSPITAL_BASED_OUTPATIENT_CLINIC_OR_DEPARTMENT_OTHER): Payer: 59 | Admitting: Oncology

## 2016-04-15 VITALS — BP 136/79 | HR 57 | Temp 98.4°F | Resp 18 | Ht 71.0 in | Wt 246.2 lb

## 2016-04-15 DIAGNOSIS — K055 Other periodontal diseases: Secondary | ICD-10-CM | POA: Diagnosis not present

## 2016-04-15 DIAGNOSIS — R1909 Other intra-abdominal and pelvic swelling, mass and lump: Secondary | ICD-10-CM | POA: Diagnosis not present

## 2016-04-15 DIAGNOSIS — C182 Malignant neoplasm of ascending colon: Secondary | ICD-10-CM

## 2016-04-15 DIAGNOSIS — C787 Secondary malignant neoplasm of liver and intrahepatic bile duct: Secondary | ICD-10-CM

## 2016-04-15 DIAGNOSIS — C183 Malignant neoplasm of hepatic flexure: Secondary | ICD-10-CM

## 2016-04-15 NOTE — Progress Notes (Signed)
[facility] OFFICE PROGRESS NOTE   Diagnosis:  Colon cancer  INTERVAL HISTORY:   Bryan Fields returns as scheduled. He reports improvement in his energy level since discontinuing chemotherapy. The abdominal mass is enlarging. Some of the superficial nodules have opened. He saw Dr. Henderson Baltimore on 04/01/2016. He is interested in enrollment on an immunotherapy trial at [facility]. He has been placed on the wait list for 2 trials at [facility]. He continues to have bleeding from the gums. Objective:  Vital signs in last 24 hours:  Blood pressure 136/79, pulse (!) 57, temperature 98.4 F (36.9 C), temperature source Oral, resp. rate 18, height 5\' 11"  (1.803 m), weight 246 lb 3.2 oz (111.7 kg), SpO2 99 %.    HEENT: Hypertrophy and erythema at the upper greater than lower gumline. Resp: Lungs clear bilaterally Cardio: Regular rate and rhythm GI: No hepatosplenomegaly, fungating upper abdominal wall mass Vascular: No leg edema   Portacath/PICC-without erythema  Lab Results:  Lab Results  Component Value Date   WBC 6.1 03/30/2016   HGB 15.0 03/30/2016   HCT 45.0 03/30/2016   MCV 99.7 (H) 03/30/2016   PLT 192 03/30/2016   NEUTROABS 4.2 03/30/2016    Medications: I have reviewed the patient's current medications.  Assessment/Plan: 1. 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 Microsatellite stable; preserved expression of major and minor MMR proteins. He began treatment with FOLFOX in December 2011. He completed cycle #12 on 10/27/2010. Oxaliplatin was held with cycles number 7 through 10 and resumed with cycle #11. 2. History of neutropenia secondary to chemotherapy. 3. History of thrombocytopenia secondary chemotherapy. 4. He did not undergo a preoperative colonoscopy. He underwent a colonoscopy on 12/19/2010 with findings of a 1 cm polyp at 90 cm from the anal verge,  minimal polyp at 30 cm from the anal verge and minimal diverticulosis. Colonoscopy 03/18/2012-negative. 5. 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 He began study drug on 05/13/2010. 6. Status post Port-A-Cath placement. The Port-A-Cath has been removed. 7. 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 9. Low attenuation lesion in the caudate lobe of the liver on the CT 04/11/2012-? Cyst. MRI liver on 04/20/2012 favored the caudate lobe liver lesion to represent a cyst or complex cyst. CT abdomen/pelvis 04/17/2013 with continued enlargement of hypovascular lesion in the caudate lobe of the liver.  PET scan 10/27/2011 confirmed a hypermetabolic caudate lobe liver lesion, tiny indeterminate lung nodules, and a 9 mm level II left neck node   CT biopsy of the liver lesion 11/07/2013 confirmed adenocarcinoma  Left liver and caudate resection 01/15/2014-pathology confirmed metastatic colon cancer, and negative surgical margins  Surveillance CT scans 07/24/2014 consistent with recurrent disease involving upper abdominal lymph nodes, peritoneal implants, and an anterior abdominal wall mass  Status post biopsy of the anterior abdominal wall mass 08/06/2014 with pathology showing metastatic adenocarcinoma consistent with a colon primary.  UGT1A1 1/28  Cycle 1 FOLFIRI/Avastin on the [facility] 1317 08/20/2014  Cycle 2 FOLFIRI/Avastin 09/03/2014  Cycle 3 held on 09/17/2014 due to neutropenia  Cycle 3 FOLFIRI/Avastin 09/25/2014. Neulasta added beginning with cycle 3.  Cycle 4 FOLFIRI/Avastin 10/08/2014  Restaging CT scans 10/18/2014 with slight interval increase in the size of the soft tissue mass in the subcutaneous fat of the epigastric region. Underlying peritoneal  implants, probable focus of residual disease along the resected margin  of the liver and hepatoduodenal ligament lymphadenopathy all appeared slightly improved. No new sites of metastatic disease otherwise noted. New left lower lobe bronchopneumonia.  Cycle 5 FOLFIRI/Avastin 10/22/2014  Cycle 6 FOLFIRI/Avastin 11/06/2014  Cycle 7 FOLFIRI Avastin 11/19/2014  Cycle 8 FOLFIRI/Avastin 12/03/2014  CT 12/14/2014 with stable disease  Cycle 9 FOLFIRI/Avastin 12/17/2014  Cycle 10 FOLFIRI/Avastin 01/07/2015 (5-fluorouracil and leucovorin dose reduced)  Cycle 11 FOLFIRI/Avastin 01/21/2015  Cycle 12 FOLFIRI/Avastin 02/04/2015  CT 02/13/2015 with stable disease  Cycle 13 FOLFIRI/Avastin 02/25/2015-changed to an every three-week protocol, now off study  Avastin held with cycle 14 FOLFIRI on 03/25/2015 secondary to gingivitis  Restaging CTs 05/20/2015-slight increase in the size of the epigastric subcutaneous mass, stable peritoneal implants  FOLFIRI continued on a 3 week schedule  Avastin resumed 06/17/2015  CTs 12/23/2015-stable subxiphoid mass, stable low-attenuation mass adjacent to the left liver surgical margin, peritoneal lesions-stable  FOLFIRI continued on a 4 week schedule.  Avastin placed on hold 01/06/2016  FOLFIRI discontinued 03/02/2016 secondary to enlargement of the abdominal wall mass with new superficial fungating nodules involving the skin  Palliative radiation to the abdominal wall mass completed 03/20/2016 10. Iron deposition within the liver noted on MRI 04/20/2012. The ferritin level was normal on 04/17/2013. Increased iron deposition noted on the left liver resection 01/15/2014 11. Pain/bleeding at the anus reported when here 04/24/2013. Status post evaluation by Dr. [name] with findings of an anal fissure. 12. Right face cystic lesion 13. Status post Port-A-Cath placement 08/14/2014 14. Delayed nausea following FOLFIRI, prophylactic Decadron added with cycle 2 with improvement. 15. Chest CT 10/18/2014 with new left lower lobe  bronchopneumonia. Levaquin initiated. Resolved on CT 12/14/2014 16. Gum pain-mucositis?, Gingivitis?-Improved with discontinuation of Avastin 17. Pain/tenderness, mild erythema and full appearance over the maxillary sinuses. Maxillofacial CT 01/30/2015 with mild to moderate bilateral maxillary and sphenoid sinus inflammatory changes. Multifocal right maxillary dental periapical lucency. Status post a right upper tooth extraction with resolution of the pain.    Disposition:  Bryan Fields appears well. He plans to see the periodontist to evaluate the gum symptoms. The abdominal wall mass is slowly enlarging.  He would like to enroll on a clinical trial at [facility]. He has been placed on the waiting list for 2 studies. We decided to place systemic therapy on hold with the hope he will enroll on a clinical trial within the next month. He will return for an office visit and Port-A-Cath flush on 05/13/2016. We will plan for treatment with oxaliplatin-based therapy if he is unable to enroll on a clinical trial within the next few months.  Betsy Coder, MD  04/15/2016  12:35 PM

## 2016-04-15 NOTE — Telephone Encounter (Signed)
Appointments scheduled per 04/15/16 los. AVS report and appointment schedule was given to patient per 04/15/16 los. °

## 2016-04-17 ENCOUNTER — Encounter: Payer: Self-pay | Admitting: Family Medicine

## 2016-04-17 ENCOUNTER — Ambulatory Visit (INDEPENDENT_AMBULATORY_CARE_PROVIDER_SITE_OTHER): Payer: 59 | Admitting: Family Medicine

## 2016-04-17 VITALS — BP 126/82 | HR 64 | Temp 98.2°F | Ht 71.0 in | Wt 246.5 lb

## 2016-04-17 DIAGNOSIS — C189 Malignant neoplasm of colon, unspecified: Secondary | ICD-10-CM | POA: Diagnosis not present

## 2016-04-17 DIAGNOSIS — R222 Localized swelling, mass and lump, trunk: Secondary | ICD-10-CM

## 2016-04-17 DIAGNOSIS — I10 Essential (primary) hypertension: Secondary | ICD-10-CM | POA: Diagnosis not present

## 2016-04-17 DIAGNOSIS — E7800 Pure hypercholesterolemia, unspecified: Secondary | ICD-10-CM

## 2016-04-17 DIAGNOSIS — E669 Obesity, unspecified: Secondary | ICD-10-CM

## 2016-04-17 DIAGNOSIS — E66811 Obesity, class 1: Secondary | ICD-10-CM

## 2016-04-17 DIAGNOSIS — Z0000 Encounter for general adult medical examination without abnormal findings: Secondary | ICD-10-CM | POA: Diagnosis not present

## 2016-04-17 MED ORDER — ATENOLOL 50 MG PO TABS: 50.0000 mg | ORAL_TABLET | Freq: Every day | ORAL | 3 refills | Status: DC

## 2016-04-17 MED ORDER — OMEPRAZOLE 40 MG PO CPDR: 40.0000 mg | DELAYED_RELEASE_CAPSULE | ORAL | 3 refills | Status: DC

## 2016-04-17 MED ORDER — SIMVASTATIN 40 MG PO TABS: 40.0000 mg | ORAL_TABLET | Freq: Every day | ORAL | 3 refills | Status: DC

## 2016-04-17 NOTE — Patient Instructions (Signed)
Return as needed or in 1 year for next physical. Try to get fruit or vegetable with all meals.   Health Maintenance, Male A healthy lifestyle and preventative care can promote health and wellness.  Maintain regular health, dental, and eye exams.  Eat a healthy diet. Foods like vegetables, fruits, whole grains, low-fat dairy products, and lean protein foods contain the nutrients you need and are low in calories. Decrease your intake of foods high in solid fats, added sugars, and salt. Get information about a proper diet from your health care provider, if necessary.  Regular physical exercise is one of the most important things you can do for your health. Most adults should get at least 150 minutes of moderate-intensity exercise (any activity that increases your heart rate and causes you to sweat) each week. In addition, most adults need muscle-strengthening exercises on 2 or more days a week.   Maintain a healthy weight. The body mass index (BMI) is a screening tool to identify possible weight problems. It provides an estimate of body fat based on height and weight. Your health care provider can find your BMI and can help you achieve or maintain a healthy weight. For males 20 years and older:  A BMI below 18.5 is considered underweight.  A BMI of 18.5 to 24.9 is normal.  A BMI of 25 to 29.9 is considered overweight.  A BMI of 30 and above is considered obese.  Maintain normal blood lipids and cholesterol by exercising and minimizing your intake of saturated fat. Eat a balanced diet with plenty of fruits and vegetables. Blood tests for lipids and cholesterol should begin at age 13 and be repeated every 5 years. If your lipid or cholesterol levels are high, you are over age 59, or you are at high risk for heart disease, you may need your cholesterol levels checked more frequently.Ongoing high lipid and cholesterol levels should be treated with medicines if diet and exercise are not  working.  If you smoke, find out from your health care provider how to quit. If you do not use tobacco, do not start.  Lung cancer screening is recommended for adults aged 51-80 years who are at high risk for developing lung cancer because of a history of smoking. A yearly low-dose CT scan of the lungs is recommended for people who have at least a 30-pack-year history of smoking and are current smokers or have quit within the past 15 years. A pack year of smoking is smoking an average of 1 pack of cigarettes a day for 1 year (for example, a 30-pack-year history of smoking could mean smoking 1 pack a day for 30 years or 2 packs a day for 15 years). Yearly screening should continue until the smoker has stopped smoking for at least 15 years. Yearly screening should be stopped for people who develop a health problem that would prevent them from having lung cancer treatment.  If you choose to drink alcohol, do not have more than 2 drinks per day. One drink is considered to be 12 oz (360 mL) of beer, 5 oz (150 mL) of wine, or 1.5 oz (45 mL) of liquor.  Avoid the use of street drugs. Do not share needles with anyone. Ask for help if you need support or instructions about stopping the use of drugs.  High blood pressure causes heart disease and increases the risk of stroke. High blood pressure is more likely to develop in:  People who have blood pressure in the end  of the normal range (100-139/85-89 mm Hg).  People who are overweight or obese.  People who are African American.  If you are 50-75 years of age, have your blood pressure checked every 3-5 years. If you are 70 years of age or older, have your blood pressure checked every year. You should have your blood pressure measured twice--once when you are at a hospital or clinic, and once when you are not at a hospital or clinic. Record the average of the two measurements. To check your blood pressure when you are not at a hospital or clinic, you can  use:  An automated blood pressure machine at a pharmacy.  A home blood pressure monitor.  If you are 32-59 years old, ask your health care provider if you should take aspirin to prevent heart disease.  Diabetes screening involves taking a blood sample to check your fasting blood sugar level. This should be done once every 3 years after age 15 if you are at a normal weight and without risk factors for diabetes. Testing should be considered at a younger age or be carried out more frequently if you are overweight and have at least 1 risk factor for diabetes.  Colorectal cancer can be detected and often prevented. Most routine colorectal cancer screening begins at the age of 50 and continues through age 86. However, your health care provider may recommend screening at an earlier age if you have risk factors for colon cancer. On a yearly basis, your health care provider may provide home test kits to check for hidden blood in the stool. A small camera at the end of a tube may be used to directly examine the colon (sigmoidoscopy or colonoscopy) to detect the earliest forms of colorectal cancer. Talk to your health care provider about this at age 26 when routine screening begins. A direct exam of the colon should be repeated every 5-10 years through age 79, unless early forms of precancerous polyps or small growths are found.  People who are at an increased risk for hepatitis B should be screened for this virus. You are considered at high risk for hepatitis B if:  You were born in a country where hepatitis B occurs often. Talk with your health care provider about which countries are considered high risk.  Your parents were born in a high-risk country and you have not received a shot to protect against hepatitis B (hepatitis B vaccine).  You have HIV or AIDS.  You use needles to inject street drugs.  You live with, or have sex with, someone who has hepatitis B.  You are a man who has sex with other  men (MSM).  You get hemodialysis treatment.  You take certain medicines for conditions like cancer, organ transplantation, and autoimmune conditions.  Hepatitis C blood testing is recommended for all people born from [birth year] through 1967 and any individual with known risk factors for hepatitis C.  Healthy men should no longer receive prostate-specific antigen (PSA) blood tests as part of routine cancer screening. Talk to your health care provider about prostate cancer screening.  Testicular cancer screening is not recommended for adolescents or adult males who have no symptoms. Screening includes self-exam, a health care provider exam, and other screening tests. Consult with your health care provider about any symptoms you have or any concerns you have about testicular cancer.  Practice safe sex. Use condoms and avoid high-risk sexual practices to reduce the spread of sexually transmitted infections (STIs).  You should be  screened for STIs, including gonorrhea and chlamydia if:  You are sexually active and are younger than 24 years.  You are older than 24 years, and your health care provider tells you that you are at risk for this type of infection.  Your sexual activity has changed since you were last screened, and you are at an increased risk for chlamydia or gonorrhea. Ask your health care provider if you are at risk.  If you are at risk of being infected with HIV, it is recommended that you take a prescription medicine daily to prevent HIV infection. This is called pre-exposure prophylaxis (PrEP). You are considered at risk if:  You are a man who has sex with other men (MSM).  You are a heterosexual man who is sexually active with multiple partners.  You take drugs by injection.  You are sexually active with a partner who has HIV.  Talk with your health care provider about whether you are at high risk of being infected with HIV. If you choose to begin PrEP, you should first be tested  for HIV. You should then be tested every 3 months for as long as you are taking PrEP.  Use sunscreen. Apply sunscreen liberally and repeatedly throughout the day. You should seek shade when your shadow is shorter than you. Protect yourself by wearing long sleeves, pants, a wide-brimmed hat, and sunglasses year round whenever you are outdoors.  Tell your health care provider of new moles or changes in moles, especially if there is a change in shape or color. Also, tell your health care provider if a mole is larger than the size of a pencil eraser.  A one-time screening for abdominal aortic aneurysm (AAA) and surgical repair of large AAAs by ultrasound is recommended for men aged 8-75 years who are current or former smokers.  Stay current with your vaccines (immunizations).   This information is not intended to replace advice given to you by your health care provider. Make sure you discuss any questions you have with your health care provider.   Document Released: 11/21/2007 Document Revised: 06/15/2014 Document Reviewed: 10/20/2010 Elsevier Interactive Patient Education [employer].

## 2016-04-17 NOTE — Assessment & Plan Note (Signed)
Enlarging.

## 2016-04-17 NOTE — Assessment & Plan Note (Signed)
Preventative protocols reviewed and updated unless pt declined. Discussed healthy diet and lifestyle.  

## 2016-04-17 NOTE — Assessment & Plan Note (Signed)
Chronic, stable. Continue fish oil and statin.

## 2016-04-17 NOTE — Assessment & Plan Note (Addendum)
Currently on chemo holiday. Hopeful for [facility] immunotherapy clinical trial. Will await notification from [facility] Consider pneumovax - pt declines today.

## 2016-04-17 NOTE — Assessment & Plan Note (Signed)
Chronic, stable. Continue atenolol.  

## 2016-04-17 NOTE — Assessment & Plan Note (Signed)
Encouraged fruit/vegetable with each meal.

## 2016-04-17 NOTE — Progress Notes (Addendum)
BP 126/82   Pulse 64   Temp 98.2 F (36.8 C) (Oral)   Ht 5\' 11"  (1.803 m)   Wt 246 lb 8 oz (111.8 kg)   BMI 34.38 kg/m    CC: CPE Subjective:    Patient ID: Bryan Fields, male    DOB: 1961/03/11, 55 y.o.   MRN: [id number]  HPI: Bryan Fields is a 55 y.o. 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 His final pathology showed a 6.5 cm adenocarcinoma with 2 of 15 nodes positive. (T3N1). He has seen Dr. Leodis Rains who completed his chemotherapy in May 2012. Also followed by surgery Dr Barry Dienes. Recently underwent dx laparoscopy and L hepatectomy 01/2014. Pathology confirmed metastatic adenocarcinoma. Surveillance CT scan 07/2014 showed recurrent metastatic colon disease (upper abd LN, peritoneal implants, anterior abd wall mass). Latest stage is IIIB (pT2 N1b). Back on chemotherapy Q3 wks. LLL pneumonia 10/2014 treated with levaquin. Portacath in place. Received palliative radiation to peritoneal mass. Referred to [facility] oncology, discussing immunotherapy clinical trials for metastatic CRC.   Preventative: Prostate cancer screening - rare nocturia. Strong stream. Will screen QO year. Last year WNL DRE/PSA.  Colon cancer - see above.  Flu shot yearly Tetanus 2009.  Seat belt use discussed.  Sunscreen use discussed. No suspicious spots on skin.  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 No regular exercise.  Diet: some vegetables/fruits. Good amt water   Relevant past medical, surgical, family and social history reviewed and updated as indicated. Interim medical history since our last visit reviewed. Allergies and medications reviewed and updated. Current Outpatient Prescriptions on File Prior to Visit  Medication Sig  .  chlorhexidine (PERIDEX) 0.12 % solution Use as directed 15 mLs in the mouth or throat 2 (two) times daily.  . Cyanocobalamin (VITAMIN B-12) 2500 MCG SUBL Place 1 tablet under the tongue daily.  . diphenoxylate-atropine (LOMOTIL) 2.5-0.025 MG tablet Take 1-2 tablets by mouth 4 (four) times daily as needed for diarrhea or loose stools.  . lidocaine-prilocaine (EMLA) cream Apply to port site one hour prior to use. Do not rub in. Cover with plastic.  Marland Kitchen loratadine (CLARITIN) 5 MG chewable tablet Chew 5 mg by mouth daily. Reported on 05/23/2015  . Melatonin 5 MG SUBL Place 10 mg under the tongue at bedtime.  . naproxen sodium (ANAPROX) 220 MG tablet Take 220 mg by mouth 2 (two) times daily as needed.   . Omega-3 Fatty Acids (OMEGA 3 PO) Take 1,400 mg by mouth 2 (two) times daily.  Marland Kitchen oxyCODONE-acetaminophen (PERCOCET/ROXICET) 5-325 MG tablet Take 1 tablet by mouth every 6 (six) hours as needed for severe pain.  Marland Kitchen prochlorperazine (COMPAZINE) 10 MG tablet TAKE 1 TABLET (10 MG TOTAL) BY MOUTH EVERY 6 (SIX) HOURS AS NEEDED FOR NAUSEA OR VOMITING.  . Pseudoephedrine-Naproxen Na ([employer] SINUS & COLD-D PO) Take 2 tablets by mouth daily as needed. Reported on 12/02/2015  . sildenafil (VIAGRA) 100 MG tablet Take 100 mg by mouth daily as needed for erectile dysfunction. Reported on 06/17/2015  . Wound Cleansers (RADIAPLEX EX) Apply 1 application topically 2 (two) times daily.   No current facility-administered medications on file prior to visit.     Review of Systems  Constitutional: Negative for activity change, appetite  change, chills, fatigue, fever and unexpected weight change.  HENT: Negative for hearing loss.   Eyes: Negative for visual disturbance.  Respiratory: Negative for cough, chest tightness, shortness of breath and wheezing.   Cardiovascular: Negative for chest pain, palpitations and leg swelling.  Gastrointestinal: Negative for abdominal distention, abdominal pain, blood in stool, constipation,  diarrhea, nausea and vomiting.  Genitourinary: Negative for difficulty urinating and hematuria.  Musculoskeletal: Negative for arthralgias, myalgias and neck pain.  Skin: Negative for rash.  Neurological: Negative for dizziness, seizures, syncope and headaches.  Hematological: Negative for adenopathy. Does not bruise/bleed easily.  Psychiatric/Behavioral: Negative for dysphoric mood. The patient is not nervous/anxious.    Per HPI unless specifically indicated in ROS section     Objective:    BP 126/82   Pulse 64   Temp 98.2 F (36.8 C) (Oral)   Ht 5\' 11"  (1.803 m)   Wt 246 lb 8 oz (111.8 kg)   BMI 34.38 kg/m   Wt Readings from Last 3 Encounters:  04/17/16 246 lb 8 oz (111.8 kg)  04/15/16 246 lb 3.2 oz (111.7 kg)  03/30/16 246 lb 14.4 oz (112 kg)    Physical Exam  Constitutional: He is oriented to person, place, and time. He appears well-developed and well-nourished. No distress.  HENT:  Head: Normocephalic and atraumatic.  Right Ear: Hearing, tympanic membrane, external ear and ear canal normal.  Left Ear: Hearing, tympanic membrane, external ear and ear canal normal.  Nose: Nose normal.  Mouth/Throat: Uvula is midline, oropharynx is clear and moist and mucous membranes are normal. No oropharyngeal exudate, posterior oropharyngeal edema or posterior oropharyngeal erythema.  Eyes: Conjunctivae and EOM are normal. Pupils are equal, round, and reactive to light. No scleral icterus.  Neck: Normal range of motion. Neck supple. No thyromegaly present.  Cardiovascular: Normal rate, regular rhythm, normal heart sounds and intact distal pulses.   No murmur heard. Pulses:      Radial pulses are 2+ on the right side, and 2+ on the left side.  Pulmonary/Chest: Effort normal and breath sounds normal. No respiratory distress. He has no wheezes. He has no rales.  Abdominal: Soft. Bowel sounds are normal. He exhibits no distension and no mass. There is no tenderness. There is no rebound and  no guarding.  Musculoskeletal: Normal range of motion. He exhibits no edema.  Lymphadenopathy:    He has no cervical adenopathy.  Neurological: He is alert and oriented to person, place, and time.  CN grossly intact, station and gait intact  Skin: Skin is warm and dry. No rash noted.  Ulcerated abdominal wall mass without surrounding erythema  Psychiatric: He has a normal mood and affect. His behavior is normal. Judgment and thought content normal.  Nursing note and vitals reviewed.  Results for orders placed or performed in visit on 04/06/16  Hepatitis C antibody  Result Value Ref Range   HCV Ab NEGATIVE NEGATIVE  Lipid panel  Result Value Ref Range   Cholesterol 126 0 - 200 mg/dL   Triglycerides 112.0 0.0 - 149.0 mg/dL   HDL 35.50 (L) >39.00 mg/dL   VLDL 22.4 0.0 - 40.0 mg/dL   LDL Cholesterol 68 0 - 99 mg/dL   Total CHOL/HDL Ratio 4    NonHDL 90.27   TSH  Result Value Ref Range   TSH 2.24 0.35 - 4.50 uIU/mL      Assessment & Plan:   Problem List Items Addressed This Visit    Abdominal wall mass  Enlarging.       Essential hypertension    Chronic, stable. Continue atenolol      Relevant Medications   atenolol (TENORMIN) 50 MG tablet   simvastatin (ZOCOR) 40 MG tablet   Healthcare maintenance - Primary    Preventative protocols reviewed and updated unless pt declined. Discussed healthy diet and lifestyle.       HYPERCHOLESTEROLEMIA    Chronic, stable. Continue fish oil and statin.      Relevant Medications   atenolol (TENORMIN) 50 MG tablet   simvastatin (ZOCOR) 40 MG tablet   Obesity, Class I, BMI 30-34.9    Encouraged fruit/vegetable with each meal.      Primary colon cancer with metastasis to other site [facility])    Currently on chemo holiday. Hopeful for [facility] immunotherapy clinical trial. Will await notification from [facility] Consider pneumovax - pt declines today.           Follow up plan: Return in about 1 year (around 04/17/2017) for annual  exam, prior fasting for blood work.  Ria Bush, MD

## 2016-05-04 NOTE — Progress Notes (Signed)
  Mr. Bryan Elgart. 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  Weight changes, if any: Wt Readings from Last 3 Encounters:  05/05/16 244 lb 9.6 oz (110.9 kg)  04/17/16 246 lb 8 oz (111.8 kg)  04/15/16 246 lb 3.2 oz (111.7 kg)   Bowel/Bladder complaints, if [id number] normal bowel movements,voiding without problems Nausea/Vomiting, if [id number] Pain issues, if any:1/10 to stomach and left foot takes Ibuprofen with relief BP 113/71   Pulse 64   Temp 98.3 F (36.8 C) (Oral)   Resp 20   Ht 5\' 11"  (1.803 m)   Wt 244 lb 9.6 oz (110.9 kg)   SpO2 98%   BMI 34.11 kg/m

## 2016-05-05 ENCOUNTER — Encounter: Payer: Self-pay | Admitting: Radiation Oncology

## 2016-05-05 ENCOUNTER — Ambulatory Visit
Admission: RE | Admit: 2016-05-05 | Discharge: 2016-05-05 | Disposition: A | Discharge status: Home / Self Care | Payer: 59 | Source: Ambulatory Visit | Attending: Radiation Oncology | Admitting: Radiation Oncology

## 2016-05-05 VITALS — BP 113/71 | HR 64 | Temp 98.3°F | Resp 20 | Ht 71.0 in | Wt 244.6 lb

## 2016-05-05 DIAGNOSIS — Z5189 Encounter for other specified aftercare: Secondary | ICD-10-CM | POA: Diagnosis present

## 2016-05-05 DIAGNOSIS — C189 Malignant neoplasm of colon, unspecified: Secondary | ICD-10-CM

## 2016-05-05 DIAGNOSIS — Z923 Personal history of irradiation: Secondary | ICD-10-CM | POA: Diagnosis not present

## 2016-05-05 DIAGNOSIS — C182 Malignant neoplasm of ascending colon: Secondary | ICD-10-CM | POA: Insufficient documentation

## 2016-05-05 DIAGNOSIS — Z79899 Other long term (current) drug therapy: Secondary | ICD-10-CM | POA: Diagnosis not present

## 2016-05-05 NOTE — Progress Notes (Signed)
Radiation Oncology         ([phone]) [phone] ________________________________  Name: Bryan Fields MRN: [id number]  Date: 05/05/2016  DOB: May 17, 1961  Post Treatment Note  CC: Ria Bush, MD  Ria Bush, MD  Diagnosis:   55 y.o.gentleman with recurrent metastatic stage IIIB, pT2N1b, adenocarcinoma of the right colon with disease in the anterior abdominal wall     Interval Since Last Radiation:  6 weeks   03/09/2016 to 03/20/2016: The abdominal wall mass was treated to 30 Gy in 10 fractions at 3 Gy per fraction.   Narrative:  The patient returns today for routine follow-up.  During the course of treatment, the patient did very well and tolerating radiation, he did not have any significant desquamation or skin toxicity. He is currently awaiting the option to enroll in an immunotherapy trial at [facility] and is hoping to hear back as to when he could be considered for this.                             ALLERGIES:  has No Known Allergies.  Meds: Current Outpatient Prescriptions  Medication Sig Dispense Refill  . atenolol (TENORMIN) 50 MG tablet Take 1 tablet (50 mg total) by mouth daily. 90 tablet 3  . chlorhexidine (PERIDEX) 0.12 % solution Use as directed 15 mLs in the mouth or throat 2 (two) times daily.    . Cyanocobalamin (VITAMIN B-12) 2500 MCG SUBL Place 1 tablet under the tongue daily.    Marland Kitchen ibuprofen (ADVIL,MOTRIN) 200 MG tablet Take 200 mg by mouth every 6 (six) hours as needed.    . Omega-3 Fatty Acids (OMEGA 3 PO) Take 1,400 mg by mouth 2 (two) times daily.    Marland Kitchen omeprazole (PRILOSEC) 40 MG capsule Take 1 capsule (40 mg total) by mouth every other day. 45 capsule 3  . oxyCODONE-acetaminophen (PERCOCET/ROXICET) 5-325 MG tablet Take 1 tablet by mouth every 6 (six) hours as needed for severe pain. 40 tablet 0  . simvastatin (ZOCOR) 40 MG tablet Take 1 tablet (40 mg total) by mouth at bedtime. 90 tablet 3  . diphenoxylate-atropine (LOMOTIL) 2.5-0.025 MG tablet Take 1-2 tablets  by mouth 4 (four) times daily as needed for diarrhea or loose stools. (Patient not taking: Reported on 05/05/2016) 60 tablet 0  . lidocaine-prilocaine (EMLA) cream Apply to port site one hour prior to use. Do not rub in. Cover with plastic. (Patient not taking: Reported on 05/05/2016) 30 g 1  . loratadine (CLARITIN) 5 MG chewable tablet Chew 5 mg by mouth daily. Reported on 05/23/2015    . Melatonin 5 MG SUBL Place 10 mg under the tongue at bedtime.    . naproxen sodium (ANAPROX) 220 MG tablet Take 220 mg by mouth 2 (two) times daily as needed.     . prochlorperazine (COMPAZINE) 10 MG tablet TAKE 1 TABLET (10 MG TOTAL) BY MOUTH EVERY 6 (SIX) HOURS AS NEEDED FOR NAUSEA OR VOMITING. (Patient not taking: Reported on 05/05/2016) 30 tablet 1  . Pseudoephedrine-Naproxen Na ([employer] SINUS & COLD-D PO) Take 2 tablets by mouth daily as needed. Reported on 12/02/2015    . sildenafil (VIAGRA) 100 MG tablet Take 100 mg by mouth daily as needed for erectile dysfunction. Reported on 06/17/2015     No current facility-administered medications for this encounter.     Physical Findings:  height is 5\' 11"  (1.803 m) and weight is 244 lb 9.6 oz (110.9 kg). His oral temperature  is 98.3 F (36.8 C). His blood pressure is 113/71 and his pulse is 64. His respiration is 20 and oxygen saturation is 98%.  In general this is a well appearing Caucasian male in no acute distress. He's alert and oriented x4 and appropriate throughout the examination. Cardiopulmonary assessment is negative for acute distress and he exhibits normal effort. Evaluation of his abdominal wall lesion appears somewhat more cystic, and the superficial site has erupted with eschar overtop.  Pretreatment 03/04/16    Posttreatment 05/05/16   Lab Findings: Lab Results  Component Value Date   WBC 6.1 03/30/2016   HGB 15.0 03/30/2016   HCT 45.0 03/30/2016   MCV 99.7 (H) 03/30/2016   PLT 192 03/30/2016     Radiographic Findings: No results  found.  Impression/Plan: 1. 55 y.o.gentleman with recurrent metastatic stage IIIB, pT2N1b, adenocarcinoma of the right colon with disease in the anterior abdominal wall. The patient is doing well since completion of radiotherapy to the anterior abdominal wall. It is difficult to tell the degree of response to therapy, but certainly still warrants discussion of additional systemic therapy. The patient is awaiting clinical trial enrollment at [facility] and will also continue to follow with Dr. Benay Spice. We will see him back as needed moving forward.     Carola Rhine, PAC

## 2016-05-13 ENCOUNTER — Ambulatory Visit (HOSPITAL_BASED_OUTPATIENT_CLINIC_OR_DEPARTMENT_OTHER): Payer: 59 | Admitting: Oncology

## 2016-05-13 ENCOUNTER — Ambulatory Visit: Payer: 59

## 2016-05-13 ENCOUNTER — Telehealth: Payer: Self-pay | Admitting: Oncology

## 2016-05-13 ENCOUNTER — Telehealth: Payer: Self-pay | Admitting: *Deleted

## 2016-05-13 VITALS — BP 143/69 | HR 61 | Temp 98.2°F | Resp 18 | Ht 71.0 in | Wt 246.6 lb

## 2016-05-13 DIAGNOSIS — C787 Secondary malignant neoplasm of liver and intrahepatic bile duct: Secondary | ICD-10-CM | POA: Diagnosis not present

## 2016-05-13 DIAGNOSIS — R1900 Intra-abdominal and pelvic swelling, mass and lump, unspecified site: Secondary | ICD-10-CM

## 2016-05-13 DIAGNOSIS — C189 Malignant neoplasm of colon, unspecified: Secondary | ICD-10-CM

## 2016-05-13 DIAGNOSIS — C182 Malignant neoplasm of ascending colon: Secondary | ICD-10-CM

## 2016-05-13 DIAGNOSIS — Z95828 Presence of other vascular implants and grafts: Secondary | ICD-10-CM

## 2016-05-13 MED ORDER — HEPARIN SOD (PORK) LOCK FLUSH 100 UNIT/ML IV SOLN
500.0000 [IU] | Freq: Once | INTRAVENOUS | Status: AC | PRN
  Administered 2016-05-13: 500 [IU] via INTRAVENOUS
  Filled 2016-05-13: qty 5

## 2016-05-13 MED ORDER — SODIUM CHLORIDE 0.9 % IJ SOLN
10.0000 mL | INTRAMUSCULAR | Status: DC | PRN
  Administered 2016-05-13: 10 mL via INTRAVENOUS
  Filled 2016-05-13: qty 10

## 2016-05-13 NOTE — Telephone Encounter (Signed)
2 bottles of contrast given to patient, per CT scheduled. 12/076/17

## 2016-05-13 NOTE — Telephone Encounter (Signed)
Made wife aware of the response from Dr. Michaele Offer' office regarding waiting list for trial. Informed her that Dr. Benay Spice wants to press forward with the Oxaliplatin as discussed. Sent email to [facility] to request [facility] testing on path tissue--has #4 possible cases. Requested to have pathologist decide which is the most appropriate to send. Requested return call with which is sent and when it is sent for follow up.

## 2016-05-13 NOTE — Progress Notes (Signed)
[facility] OFFICE PROGRESS NOTE   Diagnosis: Colon cancer  INTERVAL HISTORY:   Mr. Ticas returns as scheduled. He has noted enlargement of the abdominal wall mass with breakdown of the skin. He hit the area when using a power saw and wonders whether this caused the current pain and growth. The gum bleeding has improved. He reports feeling somnolent at times. Good appetite. He is getting out of the house.  Objective:  Vital signs in last 24 hours:  Blood pressure (!) 143/69, pulse 61, temperature 98.2 F (36.8 C), temperature source Oral, resp. rate 18, height '5\' 11"'$  (1.803 m), weight 246 lb 9.6 oz (111.9 kg), SpO2 100 %.    HEENT: Neck without mass, diminished gingival inflammation Lymphatics: No cervical or supra-clavicular nodes Resp: Lungs clear bilaterally Cardio: Regular rate and rhythm GI: No hepatomegaly, mid upper abdominal wall mass measures a proximally 8 cm in transverse dimension and 7 cm in vertical dimension. There are multiple areas of ulceration Vascular: No leg edema  Portacath/PICC-without erythema  Lab Results:  Lab Results  Component Value Date   WBC 6.1 03/30/2016   HGB 15.0 03/30/2016   HCT 45.0 03/30/2016   MCV 99.7 (H) 03/30/2016   PLT 192 03/30/2016   NEUTROABS 4.2 03/30/2016    Medications: I have reviewed the patient's current medications.  Assessment/Plan: 1. 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 Microsatellite stable; preserved expression of major and minor MMR proteins. He began treatment with FOLFOX in December 2011. He completed cycle #12 on 10/27/2010. Oxaliplatin was held with cycles number 7 through 10 and resumed with cycle #11. 2. History of neutropenia secondary to chemotherapy. 3. History of thrombocytopenia secondary chemotherapy. 4. He did not undergo a preoperative colonoscopy. He underwent a  colonoscopy on 12/19/2010 with findings of a 1 cm polyp at 90 cm from the anal verge, minimal polyp at 30 cm from the anal verge and minimal diverticulosis. Colonoscopy 03/18/2012-negative. 5. 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 He began study drug on 05/13/2010. 6. Status post Port-A-Cath placement. The Port-A-Cath has been removed. 7. 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 9. Low attenuation lesion in the caudate lobe of the liver on the CT 04/11/2012-? Cyst. MRI liver on 04/20/2012 favored the caudate lobe liver lesion to represent a cyst or complex cyst. CT abdomen/pelvis 04/17/2013 with continued enlargement of hypovascular lesion in the caudate lobe of the liver.  PET scan 10/27/2011 confirmed a hypermetabolic caudate lobe liver lesion, tiny indeterminate lung nodules, and a 9 mm level II left neck node   CT biopsy of the liver lesion 11/07/2013 confirmed adenocarcinoma  Left liver and caudate resection 01/15/2014-pathology confirmed metastatic colon cancer, and negative surgical margins  Surveillance CT scans 07/24/2014 consistent with recurrent disease involving upper abdominal lymph nodes, peritoneal implants, and an anterior abdominal wall mass  Status post biopsy of the anterior abdominal wall mass 08/06/2014 with pathology showing metastatic adenocarcinoma consistent with a colon primary.  [id number] 1/28  Cycle 1 FOLFIRI/Avastin on the [facility] 1317 08/20/2014  Cycle 2 FOLFIRI/Avastin 09/03/2014  Cycle 3 held on 09/17/2014 due to neutropenia  Cycle 3 FOLFIRI/Avastin 09/25/2014. Neulasta added beginning with cycle 3.  Cycle 4 FOLFIRI/Avastin 10/08/2014  Restaging CT scans 10/18/2014 with slight interval increase in the size of the  soft tissue mass in the subcutaneous fat of the epigastric region.  Underlying peritoneal implants, probable focus of residual disease along the resected margin of the liver and hepatoduodenal ligament lymphadenopathy all appeared slightly improved. No new sites of metastatic disease otherwise noted. New left lower lobe bronchopneumonia.  Cycle 5 FOLFIRI/Avastin 10/22/2014  Cycle 6 FOLFIRI/Avastin 11/06/2014  Cycle 7 FOLFIRI Avastin 11/19/2014  Cycle 8 FOLFIRI/Avastin 12/03/2014  CT 12/14/2014 with stable disease  Cycle 9 FOLFIRI/Avastin 12/17/2014  Cycle 10 FOLFIRI/Avastin 01/07/2015 (5-fluorouracil and leucovorin dose reduced)  Cycle 11 FOLFIRI/Avastin 01/21/2015  Cycle 12 FOLFIRI/Avastin 02/04/2015  CT 02/13/2015 with stable disease  Cycle 13 FOLFIRI/Avastin 02/25/2015-changed to an every three-week protocol, now off study  Avastin held with cycle 14 FOLFIRI on 03/25/2015 secondary to gingivitis  Restaging CTs 05/20/2015-slight increase in the size of the epigastric subcutaneous mass, stable peritoneal implants  FOLFIRI continued on a 3 week schedule  Avastin resumed 06/17/2015  CTs 12/23/2015-stable subxiphoid mass, stable low-attenuation mass adjacent to the left liver surgical margin, peritoneal lesions-stable  FOLFIRI continued on a 4 week schedule.  Avastin placed on hold 01/06/2016  FOLFIRI discontinued 03/02/2016 secondary to enlargement of the abdominal wall mass with new superficial fungating nodules involving the skin  Palliative radiation to the abdominal wall mass completed 03/20/2016 10. Iron deposition within the liver noted on MRI 04/20/2012. The ferritin level was normal on 04/17/2013. Increased iron deposition noted on the left liver resection 01/15/2014 11. Pain/bleeding at the anus reported when here 04/24/2013. Status post evaluation by Dr. [name] with findings of an anal fissure. 12. Right face cystic lesion 13. Status post Port-A-Cath placement 08/14/2014 14. Delayed nausea following FOLFIRI, prophylactic Decadron  added with cycle 2 with improvement. 15. Chest CT 10/18/2014 with new left lower lobe bronchopneumonia. Levaquin initiated. Resolved on CT 12/14/2014 16. Gum pain-mucositis?, Gingivitis?-Improved with discontinuation of Avastin 17. Pain/tenderness, mild erythema and full appearance over the maxillary sinuses. Maxillofacial CT 01/30/2015 with mild to moderate bilateral maxillary and sphenoid sinus inflammatory changes. Multifocal right maxillary dental periapical lucency. Status post a right upper tooth extraction with resolution of the pain.      Disposition:  Mr. Gallicchio has metastatic colon cancer. We received a message from [facility] today that he is not eligible for a current clinical trial there. He has not received oxaliplatin since 2012. He did not progress while on oxaliplatin therapy. We decided to begin salvage CAPOX. I am hesitant to add Avastin with the necrosis/skin breakdown of the fungating abdominal wall mass.  Mr. Hetland would like to wait until after Christmas to begin treatment. He will return for an office visit and cycle 1 CAPOX beginning 06/03/2016.  I reviewed the potential toxicities associated with CAPOX regimen including the chance of neuropathy, diarrhea, and hand/foot syndrome. He agrees to proceed.  Approximately 40 minutes were spent with patient today. The majority of the time was used for counseling and coordination of care.  Betsy Coder, MD  05/13/2016  11:50 AM

## 2016-05-13 NOTE — Telephone Encounter (Signed)
Per LOS I have scheduled appts and notified the scheduler 

## 2016-05-13 NOTE — Progress Notes (Signed)
START ON PATHWAY REGIMEN - Colorectal  [id number]: CapeOx q21 Days   A cycle is every 21 days:     Capecitabine (Xeloda(R)) 850 mg/m2 orally twice a day days 1 through 14 followed by 7 days off Dose Mod: None     Oxaliplatin (Eloxatin(R)) 130 mg/m2 in 250 mL D5W IV over 2 hours day 1 every 21 days Dose Mod: None  **Always confirm dose/schedule in your pharmacy ordering system**    Patient Characteristics: Metastatic Colorectal, Second Line, KRAS Mutation Positive/Unknown, BRAF Wild-Type/Unknown, Bevacizumab Ineligible AJCC T Stage: X AJCC N Stage: X AJCC Stage Grouping: IVB AJCC M Stage: X Current evidence of distant metastases? Yes BRAF Mutation Status: Did Not Order Test KRAS/NRAS Mutation Status: Mutation Positive Line of therapy: Second Line Would you be surprised if this patient died  in the next year? I would NOT be surprised if this patient died in the next year  Intent of Therapy: Non-Curative / Palliative Intent, Discussed with Patient

## 2016-05-13 NOTE — Telephone Encounter (Signed)
Informed by Silverio Lay that the study coordinator told her to call back and make Dr. Benay Spice aware that currently there is no trial that he is eligible for at this time. January would be first possible opportunity. Says that Dr. Aleatha Borer manages her waiting list and alerts study coordinator when she has something and then patient is called. Dr. Aleatha Borer will them email the referring provider. She did give out the phone # [phone] of Barnetta Chapel (lead study coordinator).

## 2016-05-13 NOTE — Telephone Encounter (Signed)
Message sent to chemo scheduler to be added, per 05/14/16 los. Per Dr Benay Spice, ok to start Chemo on 06/03/16, per 06/02/16 is capped. Appointments scheduled per 05/13/16 los. A copy of the AVS report and appointment schedule was given to the patient, per 05/13/16 los.

## 2016-05-13 NOTE — Telephone Encounter (Signed)
Called [facility] to speak w/research nurse regarding status of clinical trial he is on waiting list for. Was put to voice mail. Left message with direct # to return call to GI Navigator. Patient notified.

## 2016-05-14 ENCOUNTER — Other Ambulatory Visit (HOSPITAL_COMMUNITY)
Admission: RE | Admit: 2016-05-14 | Discharge: 2016-05-14 | Disposition: A | Discharge status: Home / Self Care | Payer: 59 | Source: Ambulatory Visit | Attending: Oncology | Admitting: Oncology

## 2016-05-14 ENCOUNTER — Telehealth: Payer: Self-pay | Admitting: Pharmacist

## 2016-05-14 DIAGNOSIS — C229 Malignant neoplasm of liver, not specified as primary or secondary: Secondary | ICD-10-CM | POA: Insufficient documentation

## 2016-05-14 NOTE — Telephone Encounter (Signed)
Received new prescription for Xeloda. Faxed to [place] for benefits analysis. Fax confirmation received.  Labs reviewed - ok for treatment. Xeloda reviewed for drug interactions with current medication list in Epic. Xeloda may interact with Prilosec -> Category C monitor as Prilosec may decrease Xeloda drug concentrations. Data conflicting. MD aware.  Will continue to follow.  Raul Del, PharmD, BCPS, BCOP [facility] [phone]

## 2016-05-25 ENCOUNTER — Ambulatory Visit (HOSPITAL_COMMUNITY)
Admission: RE | Admit: 2016-05-25 | Discharge: 2016-05-25 | Disposition: A | Discharge status: Home / Self Care | Payer: 59 | Source: Ambulatory Visit | Attending: Oncology | Admitting: Oncology

## 2016-05-25 DIAGNOSIS — I700 Atherosclerosis of aorta: Secondary | ICD-10-CM | POA: Diagnosis not present

## 2016-05-25 DIAGNOSIS — C7989 Secondary malignant neoplasm of other specified sites: Secondary | ICD-10-CM | POA: Insufficient documentation

## 2016-05-25 DIAGNOSIS — K8020 Calculus of gallbladder without cholecystitis without obstruction: Secondary | ICD-10-CM | POA: Diagnosis not present

## 2016-05-25 DIAGNOSIS — C189 Malignant neoplasm of colon, unspecified: Secondary | ICD-10-CM | POA: Diagnosis present

## 2016-05-25 MED ORDER — IOPAMIDOL (ISOVUE-300) INJECTION 61%
INTRAVENOUS | Status: AC
  Administered 2016-05-25: 100 mL
  Filled 2016-05-25: qty 100

## 2016-05-28 ENCOUNTER — Telehealth: Payer: Self-pay | Admitting: *Deleted

## 2016-05-28 NOTE — Telephone Encounter (Signed)
Fax from Dr. Lewanda Rife requesting clearance for tooth extractions. Asks if it is possible to delay chemotherapy for 3 weeks post-op to allow pt to obtain initial post surgical healing. Will review with MD, pt is scheduled for tx on 12/27.

## 2016-05-29 ENCOUNTER — Telehealth: Payer: Self-pay | Admitting: *Deleted

## 2016-05-29 NOTE — Telephone Encounter (Signed)
Call received from Mena Goes [phone] to inform [place] that Oxaliplatin PA has been complete from 05/29/16-06/03/17. Authorization [id number].  Information e-mailed to Elliot Gault per pharmacy request.

## 2016-06-01 ENCOUNTER — Other Ambulatory Visit: Payer: Self-pay | Admitting: Oncology

## 2016-06-03 ENCOUNTER — Other Ambulatory Visit (HOSPITAL_BASED_OUTPATIENT_CLINIC_OR_DEPARTMENT_OTHER): Payer: 59

## 2016-06-03 ENCOUNTER — Telehealth: Payer: Self-pay | Admitting: *Deleted

## 2016-06-03 ENCOUNTER — Ambulatory Visit (HOSPITAL_BASED_OUTPATIENT_CLINIC_OR_DEPARTMENT_OTHER): Payer: 59 | Admitting: Nurse Practitioner

## 2016-06-03 ENCOUNTER — Ambulatory Visit (HOSPITAL_BASED_OUTPATIENT_CLINIC_OR_DEPARTMENT_OTHER): Payer: 59

## 2016-06-03 ENCOUNTER — Telehealth: Payer: Self-pay | Admitting: Nurse Practitioner

## 2016-06-03 ENCOUNTER — Ambulatory Visit: Payer: 59

## 2016-06-03 VITALS — BP 118/74 | HR 59 | Temp 98.2°F | Resp 18 | Ht 71.0 in | Wt 249.5 lb

## 2016-06-03 DIAGNOSIS — C189 Malignant neoplasm of colon, unspecified: Secondary | ICD-10-CM

## 2016-06-03 DIAGNOSIS — Z5111 Encounter for antineoplastic chemotherapy: Secondary | ICD-10-CM | POA: Diagnosis not present

## 2016-06-03 DIAGNOSIS — C182 Malignant neoplasm of ascending colon: Secondary | ICD-10-CM | POA: Diagnosis not present

## 2016-06-03 DIAGNOSIS — Z95828 Presence of other vascular implants and grafts: Secondary | ICD-10-CM

## 2016-06-03 LAB — COMPREHENSIVE METABOLIC PANEL
ALBUMIN: 3.7 g/dL (ref 3.5–5.0)
ALK PHOS: 79 U/L (ref 40–150)
ALT: 27 U/L (ref 0–55)
ANION GAP: 10 meq/L (ref 3–11)
AST: 22 U/L (ref 5–34)
BILIRUBIN TOTAL: 0.59 mg/dL (ref 0.20–1.20)
BUN: 17.6 mg/dL (ref 7.0–26.0)
CO2: 24 mEq/L (ref 22–29)
CREATININE: 1.2 mg/dL (ref 0.7–1.3)
Calcium: 9.3 mg/dL (ref 8.4–10.4)
Chloride: 105 mEq/L (ref 98–109)
EGFR: 66 mL/min/{1.73_m2} — ABNORMAL LOW (ref >90)
Glucose: 136 mg/dl (ref 70–140)
Potassium: 3.8 mEq/L (ref 3.5–5.1)
Sodium: 139 mEq/L (ref 136–145)
TOTAL PROTEIN: 7.6 g/dL (ref 6.4–8.3)

## 2016-06-03 LAB — UA PROTEIN, DIPSTICK - CHCC: Protein, ur: <30 mg/dL

## 2016-06-03 LAB — CBC WITH DIFFERENTIAL/PLATELET
BASO%: 0.3 % (ref 0.0–2.0)
Basophils Absolute: 0 10*3/uL (ref 0.0–0.1)
EOS ABS: 0.2 10*3/uL (ref 0.0–0.5)
EOS%: 2.6 % (ref 0.0–7.0)
HEMATOCRIT: 45.4 % (ref 38.4–49.9)
HEMOGLOBIN: 15.9 g/dL (ref 13.0–17.1)
LYMPH#: 1.4 10*3/uL (ref 0.9–3.3)
LYMPH%: 24.1 % (ref 14.0–49.0)
MCH: 33.1 pg (ref 27.2–33.4)
MCHC: 35 g/dL (ref 32.0–36.0)
MCV: 94.6 fL (ref 79.3–98.0)
MONO#: 0.4 10*3/uL (ref 0.1–0.9)
MONO%: 6.3 % (ref 0.0–14.0)
NEUT%: 66.7 % (ref 39.0–75.0)
NEUTROS ABS: 3.9 10*3/uL (ref 1.5–6.5)
PLATELETS: 233 10*3/uL (ref 140–400)
RBC: 4.8 10*6/uL (ref 4.20–5.82)
RDW: 13 % (ref 11.0–14.6)
WBC: 5.9 10*3/uL (ref 4.0–10.3)

## 2016-06-03 LAB — CEA (IN HOUSE-CHCC): CEA (CHCC-IN HOUSE): 2.32 ng/mL (ref 0.00–5.00)

## 2016-06-03 MED ORDER — DEXAMETHASONE SODIUM PHOSPHATE 10 MG/ML IJ SOLN
INTRAMUSCULAR | Status: AC
  Filled 2016-06-03: qty 1

## 2016-06-03 MED ORDER — SODIUM CHLORIDE 0.9 % IJ SOLN
10.0000 mL | INTRAMUSCULAR | Status: DC | PRN
  Administered 2016-06-03: 10 mL via INTRAVENOUS
  Filled 2016-06-03: qty 10

## 2016-06-03 MED ORDER — PALONOSETRON HCL INJECTION 0.25 MG/5ML
INTRAVENOUS | Status: AC
  Filled 2016-06-03: qty 5

## 2016-06-03 MED ORDER — SODIUM CHLORIDE 0.9% FLUSH
10.0000 mL | INTRAVENOUS | Status: DC | PRN
  Administered 2016-06-03: 10 mL
  Filled 2016-06-03: qty 10

## 2016-06-03 MED ORDER — HEPARIN SOD (PORK) LOCK FLUSH 100 UNIT/ML IV SOLN
500.0000 [IU] | Freq: Once | INTRAVENOUS | Status: DC | PRN
Start: 2016-06-03 — End: 2016-06-03
  Filled 2016-06-03: qty 5

## 2016-06-03 MED ORDER — SODIUM CHLORIDE 0.9 % IV SOLN: 10.0000 mg | Freq: Once | INTRAVENOUS | Status: DC

## 2016-06-03 MED ORDER — DEXAMETHASONE SODIUM PHOSPHATE 10 MG/ML IJ SOLN
10.0000 mg | Freq: Once | INTRAMUSCULAR | Status: AC
  Administered 2016-06-03: 10 mg via INTRAVENOUS

## 2016-06-03 MED ORDER — SODIUM CHLORIDE 0.9 % IV SOLN
2400.0000 mg/m2 | INTRAVENOUS | Status: DC
  Administered 2016-06-03: 5700 mg via INTRAVENOUS
  Filled 2016-06-03: qty 114

## 2016-06-03 MED ORDER — DEXTROSE 5 % IV SOLN
Freq: Once | INTRAVENOUS | Status: AC
  Administered 2016-06-03: 11:00:00 via INTRAVENOUS

## 2016-06-03 MED ORDER — PALONOSETRON HCL INJECTION 0.25 MG/5ML
0.2500 mg | Freq: Once | INTRAVENOUS | Status: AC
  Administered 2016-06-03: 0.25 mg via INTRAVENOUS

## 2016-06-03 MED ORDER — LEUCOVORIN CALCIUM INJECTION 350 MG
400.0000 mg/m2 | Freq: Once | INTRAMUSCULAR | Status: AC
  Administered 2016-06-03: 952 mg via INTRAVENOUS
  Filled 2016-06-03: qty 47.6

## 2016-06-03 MED ORDER — OXALIPLATIN CHEMO INJECTION 100 MG/20ML
85.0000 mg/m2 | Freq: Once | INTRAVENOUS | Status: AC
  Administered 2016-06-03: 200 mg via INTRAVENOUS
  Filled 2016-06-03: qty 20

## 2016-06-03 NOTE — Progress Notes (Signed)
[facility] OFFICE PROGRESS NOTE   Diagnosis:  Colon cancer  INTERVAL HISTORY:   Mr. Lienhard returns as scheduled prior to proceeding with cycle 1 CAPOX. He notes progression of the abdominal wall mass. There is associated bleeding. Energy level is poor. No nausea or vomiting. No diarrhea. Intermittent mild tingling in the fingertips and toes. He reports extraction of multiple teeth has been recommended by his oral surgeon.  He has not received Xeloda.  Objective:  Vital signs in last 24 hours:  Blood pressure 118/74, pulse (!) 59, temperature 98.2 F (36.8 C), temperature source Oral, resp. rate 18, height '5\' 11"'$  (1.803 m), weight 249 lb 8 oz (113.2 kg), SpO2 98 %.    HEENT: No thrush or ulcers. Resp: Lungs clear bilaterally. Cardio: Regular rate and rhythm. GI: No hepatomegaly. Large mid upper abdominal wall mass with multiple areas of ulceration and bleeding. Vascular: No leg edema. Port-A-Cath without erythema.  Lab Results:  Lab Results  Component Value Date   WBC 5.9 06/03/2016   HGB 15.9 06/03/2016   HCT 45.4 06/03/2016   MCV 94.6 06/03/2016   PLT 233 06/03/2016   NEUTROABS 3.9 06/03/2016    Imaging:  No results found.  Medications: I have reviewed the patient's current medications.  Assessment/Plan: 1. 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 Microsatellite stable; preserved expression of major and minor MMR proteins. He began treatment with FOLFOX in December 2011. He completed cycle #12 on 10/27/2010. Oxaliplatin was held with cycles number 7 through 10 and resumed with cycle #11. 2. History of neutropenia secondary to chemotherapy. 3. History of thrombocytopenia secondary chemotherapy. 4. He did not undergo a preoperative colonoscopy. He underwent a colonoscopy on 12/19/2010 with findings of a 1 cm polyp at 90 cm from the anal  verge, minimal polyp at 30 cm from the anal verge and minimal diverticulosis. Colonoscopy 03/18/2012-negative. 5. 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 He began study drug on 05/13/2010. 6. Status post Port-A-Cath placement. The Port-A-Cath has been removed. 7. 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 9. Low attenuation lesion in the caudate lobe of the liver on the CT 04/11/2012-? Cyst. MRI liver on 04/20/2012 favored the caudate lobe liver lesion to represent a cyst or complex cyst. CT abdomen/pelvis 04/17/2013 with continued enlargement of hypovascular lesion in the caudate lobe of the liver.  PET scan 10/27/2011 confirmed a hypermetabolic caudate lobe liver lesion, tiny indeterminate lung nodules, and a 9 mm level II left neck node   CT biopsy of the liver lesion 11/07/2013 confirmed adenocarcinoma  Left liver and caudate resection 01/15/2014-pathology confirmed metastatic colon cancer, and negative surgical margins  Surveillance CT scans 07/24/2014 consistent with recurrent disease involving upper abdominal lymph nodes, peritoneal implants, and an anterior abdominal wall mass  Status post biopsy of the anterior abdominal wall mass 08/06/2014 with pathology showing metastatic adenocarcinoma consistent with a colon primary.  UGT1A1 1/28  Cycle 1 FOLFIRI/Avastin on the [facility] 1317 08/20/2014  Cycle 2 FOLFIRI/Avastin 09/03/2014  Cycle 3 held on 09/17/2014 due to neutropenia  Cycle 3 FOLFIRI/Avastin 09/25/2014. Neulasta added beginning with cycle 3.  Cycle 4 FOLFIRI/Avastin 10/08/2014  Restaging CT scans 10/18/2014 with slight interval increase in the size of the soft tissue mass in the subcutaneous fat of the epigastric region.  Underlying peritoneal implants, probable focus of residual disease along the resected  margin of the liver and hepatoduodenal ligament lymphadenopathy all appeared slightly improved. No new sites of metastatic disease otherwise noted. New left lower lobe bronchopneumonia.  Cycle 5 FOLFIRI/Avastin 10/22/2014  Cycle 6 FOLFIRI/Avastin 11/06/2014  Cycle 7 FOLFIRI Avastin 11/19/2014  Cycle 8 FOLFIRI/Avastin 12/03/2014  CT 12/14/2014 with stable disease  Cycle 9 FOLFIRI/Avastin 12/17/2014  Cycle 10 FOLFIRI/Avastin 01/07/2015 (5-fluorouracil and leucovorin dose reduced)  Cycle 11 FOLFIRI/Avastin 01/21/2015  Cycle 12 FOLFIRI/Avastin 02/04/2015  CT 02/13/2015 with stable disease  Cycle 13 FOLFIRI/Avastin 02/25/2015-changed to an every three-week protocol, now off study  Avastin held with cycle 14 FOLFIRI on 03/25/2015 secondary to gingivitis  Restaging CTs 05/20/2015-slight increase in the size of the epigastric subcutaneous mass, stable peritoneal implants  FOLFIRI continued on a 3 week schedule  Avastin resumed 06/17/2015  CTs 12/23/2015-stable subxiphoid mass, stable low-attenuation mass adjacent to the left liver surgical margin, peritoneal lesions-stable  FOLFIRI continued on a 4 week schedule.  Avastin placed on hold 01/06/2016  FOLFIRI discontinued 03/02/2016 secondary to enlargement of the abdominal wall mass with new superficial fungating nodules involving the skin  Palliative radiation to the abdominal wall mass completed 03/20/2016  Cycle 1 CAPOX 06/03/2016 10. Iron deposition within the liver noted on MRI 04/20/2012. The ferritin level was normal on 04/17/2013. Increased iron deposition noted on the left liver resection 01/15/2014 11. Pain/bleeding at the anus reported when here 04/24/2013. Status post evaluation by Dr. [name] with findings of an anal fissure. 12. Right face cystic lesion 13. Status post Port-A-Cath placement 08/14/2014 14. Delayed nausea following FOLFIRI, prophylactic Decadron added with cycle 2 with improvement. 15. Chest CT  10/18/2014 with new left lower lobe bronchopneumonia. Levaquin initiated. Resolved on CT 12/14/2014 16. Gum pain-mucositis?, Gingivitis?-Improved with discontinuation of Avastin 17. Pain/tenderness, mild erythema and full appearance over the maxillary sinuses. Maxillofacial CT 01/30/2015 with mild to moderate bilateral maxillary and sphenoid sinus inflammatory changes. Multifocal right maxillary dental periapical lucency. Status post a right upper tooth extraction with resolution of the pain.   Disposition: Bryan Fields appears stable. The abdominal wall mass continues to progress. The initial plan was for him to begin treatment on the CAPOX regimen today. The Xeloda is cost prohibitive. We decided to switch to the FOLFOX regimen and proceed with treatment today as scheduled. He is agreeable with this plan.  He will return for a follow-up visit and cycle 2 FOLFOX on 06/23/2015. He will contact the office in the interim with any problems.  Plan reviewed with Dr. Burr Medico. 25 minutes were spent face-to-face at today's visit with the majority of that time involved in counseling/coordination of care.    Ned Card ANP/GNP-BC   06/03/2016  9:13 AM

## 2016-06-03 NOTE — Telephone Encounter (Signed)
Appointments scheduled per 12/27 LOS. Patient given AVS report and calendars with future scheduled appointments. °

## 2016-06-03 NOTE — Telephone Encounter (Signed)
Called [facility] to follow up on Xeloda script. Co-pay is over $1,100. Arbie Cookey [facility] pharmacist is looking into co-pay assistance options.

## 2016-06-03 NOTE — Telephone Encounter (Signed)
Left VM for [facility] to follow up on status of results. Was informed on 12/14 that results should be available by 12/26.

## 2016-06-03 NOTE — Patient Instructions (Addendum)
[facility] Discharge Instructions for Patients Receiving Chemotherapy  Today you received the following chemotherapy agents:  Oxaliplatin, Leucovorin, Fluorouracil  To help prevent nausea and vomiting after your treatment, we encourage you to take your nausea medication as prescribed.   If you develop nausea and vomiting that is not controlled by your nausea medication, call the clinic.   BELOW ARE SYMPTOMS THAT SHOULD BE REPORTED IMMEDIATELY:  *FEVER GREATER THAN 100.5 F  *CHILLS WITH OR WITHOUT FEVER  NAUSEA AND VOMITING THAT IS NOT CONTROLLED WITH YOUR NAUSEA MEDICATION  *UNUSUAL SHORTNESS OF BREATH  *UNUSUAL BRUISING OR BLEEDING  TENDERNESS IN MOUTH AND THROAT WITH OR WITHOUT PRESENCE OF ULCERS  *URINARY PROBLEMS  *BOWEL PROBLEMS  UNUSUAL RASH Items with * indicate a potential emergency and should be followed up as soon as possible.  Feel free to call the clinic you have any questions or concerns. The clinic phone number is [phone].  Please show the CHEMO ALERT CARD at check-in to the Emergency Department and triage nurse.   

## 2016-06-03 NOTE — Patient Instructions (Signed)

## 2016-06-03 NOTE — Telephone Encounter (Signed)
Per LOS I have scheduled appt and gave calendar

## 2016-06-04 ENCOUNTER — Encounter: Payer: Self-pay | Admitting: *Deleted

## 2016-06-04 ENCOUNTER — Other Ambulatory Visit: Payer: Self-pay | Admitting: *Deleted

## 2016-06-04 ENCOUNTER — Telehealth: Payer: Self-pay | Admitting: *Deleted

## 2016-06-04 ENCOUNTER — Telehealth: Payer: Self-pay | Admitting: Pharmacist

## 2016-06-04 ENCOUNTER — Other Ambulatory Visit: Payer: Self-pay | Admitting: Hematology

## 2016-06-04 MED ORDER — CAPECITABINE 500 MG PO TABS: 2000.0000 mg | ORAL_TABLET | Freq: Two times a day (BID) | ORAL | 0 refills | Status: DC

## 2016-06-04 NOTE — Progress Notes (Signed)
Oncology Nurse Navigator Documentation  Oncology Nurse Navigator Flowsheets 06/04/2016  Navigator Location [facility]  Navigator Encounter Type Telephone;Letter/Fax/Email--call to [place] [facility] to f/u on results.  Patient Visit Type -  Treatment Phase -  Barriers/Navigation Needs -  Interventions Other--[facility] results received and forwarded to MD desk  Support Groups/Services -  [employer] -  Time Spent with Patient -

## 2016-06-04 NOTE — Telephone Encounter (Signed)
Oral Chemotherapy Pharmacist Encounter  Received notification from Merceda Elks, RN, GI nurse navigator that patient has a prohibitively expensive copayment for his Xeloda prescription (~$1100)  His prescription was sent to [facility] on 05/13/16, but this is the 1st notification we have received about his copay.  Noted his insurance is through [facility] with [employer] as his prescription benefit provider. Due to this, I will have Rosalio Macadamia, RN, collaborative practice RN at Dr. Gearldine Shown desk today e-scribe patient's Xeloda prescription to [facility] in [place], [place]. I will fax clinical info to [phone] for benefits analysis and prescription processing.  Oral Oncology Clinic will continue to follow.  Johny Drilling, PharmD, BCPS, BCOP 06/04/2016  1:52 PM Oral Oncology Clinic [phone]

## 2016-06-04 NOTE — Telephone Encounter (Signed)
Spoke with Valora Corporal from [facility] about chemo authorization for pt.  Per Kathlee Nations, pt is approved for chemo from 06/03/16  Through   06/02/17.   It is not a guarantee for payment;  All eligibility and payment will be determined at time the claims received - per Kathlee Nations. Pt is approved for : Neulasta Oxaliplatin Leucovorin Fluorouracil Avastin The authorization  #    [id number].  Valora Corporal   Phone      [phone]  Ext   [phone]

## 2016-06-05 ENCOUNTER — Ambulatory Visit (HOSPITAL_BASED_OUTPATIENT_CLINIC_OR_DEPARTMENT_OTHER): Payer: 59

## 2016-06-05 VITALS — BP 132/70 | HR 65 | Temp 97.8°F | Resp 16

## 2016-06-05 DIAGNOSIS — Z452 Encounter for adjustment and management of vascular access device: Secondary | ICD-10-CM | POA: Diagnosis not present

## 2016-06-05 DIAGNOSIS — C787 Secondary malignant neoplasm of liver and intrahepatic bile duct: Secondary | ICD-10-CM

## 2016-06-05 DIAGNOSIS — C182 Malignant neoplasm of ascending colon: Secondary | ICD-10-CM | POA: Diagnosis not present

## 2016-06-05 DIAGNOSIS — C189 Malignant neoplasm of colon, unspecified: Secondary | ICD-10-CM

## 2016-06-05 MED ORDER — SODIUM CHLORIDE 0.9% FLUSH
10.0000 mL | INTRAVENOUS | Status: DC | PRN
  Administered 2016-06-05: 10 mL
  Filled 2016-06-05: qty 10

## 2016-06-05 MED ORDER — HEPARIN SOD (PORK) LOCK FLUSH 100 UNIT/ML IV SOLN
500.0000 [IU] | Freq: Once | INTRAVENOUS | Status: AC | PRN
  Administered 2016-06-05: 500 [IU]
  Filled 2016-06-05: qty 5

## 2016-06-05 NOTE — Telephone Encounter (Signed)
Oral Chemotherapy Pharmacist Encounter  I followed up with [facility] for update on patient's Xeloda prescription. His copay is $895 Per the patient's request, [employer] has forwarded the patient's case to their own patient assistance department. They have left the patient a message to call them back.  If any one speaks with patient, please make sure he calls [employer] at [phone].  Oral Oncology Clinic will continue to follow.  Johny Drilling, PharmD, BCPS, BCOP 06/05/2016  4:00 PM Oral Oncology Clinic [phone]

## 2016-06-10 ENCOUNTER — Telehealth: Payer: Self-pay | Admitting: Pharmacist

## 2016-06-10 ENCOUNTER — Encounter: Payer: Self-pay | Admitting: Oncology

## 2016-06-10 NOTE — Progress Notes (Signed)
Pt is approved w/ PAN for Xeloda from 03/11/16 to 06-13-17 for $2,800.

## 2016-06-10 NOTE — Telephone Encounter (Signed)
I s/w rep from [facility] and was informed pt was approved for patient assistance x 1 year.  Order placed for Xeloda and delivering drug to pt on 06/12/16.  Kennith Center, Pharm.D., CPP 06/10/2016@1 :[facility]

## 2016-06-10 NOTE — Telephone Encounter (Signed)
This is Dr. Gearldine Shown pt.   Truitt Merle MD

## 2016-06-12 ENCOUNTER — Telehealth: Payer: Self-pay | Admitting: *Deleted

## 2016-06-12 NOTE — Telephone Encounter (Signed)
Call from pt to clarify Xeloda start date. Confirmed with Dr. Benay Spice: Will start on 06/22/16. Pt voiced understanding.

## 2016-06-22 ENCOUNTER — Telehealth: Payer: Self-pay | Admitting: *Deleted

## 2016-06-22 ENCOUNTER — Other Ambulatory Visit (HOSPITAL_BASED_OUTPATIENT_CLINIC_OR_DEPARTMENT_OTHER): Payer: 59

## 2016-06-22 ENCOUNTER — Encounter: Payer: Self-pay | Admitting: *Deleted

## 2016-06-22 ENCOUNTER — Ambulatory Visit (HOSPITAL_BASED_OUTPATIENT_CLINIC_OR_DEPARTMENT_OTHER): Payer: 59

## 2016-06-22 ENCOUNTER — Ambulatory Visit: Payer: 59

## 2016-06-22 ENCOUNTER — Ambulatory Visit (HOSPITAL_BASED_OUTPATIENT_CLINIC_OR_DEPARTMENT_OTHER): Payer: 59 | Admitting: Nurse Practitioner

## 2016-06-22 ENCOUNTER — Telehealth: Payer: Self-pay | Admitting: Oncology

## 2016-06-22 ENCOUNTER — Ambulatory Visit: Payer: 59 | Admitting: Nurse Practitioner

## 2016-06-22 VITALS — BP 114/67 | HR 52 | Resp 20

## 2016-06-22 VITALS — BP 134/75 | HR 59 | Temp 98.7°F | Resp 18 | Ht 71.0 in | Wt 251.8 lb

## 2016-06-22 DIAGNOSIS — C182 Malignant neoplasm of ascending colon: Secondary | ICD-10-CM

## 2016-06-22 DIAGNOSIS — C189 Malignant neoplasm of colon, unspecified: Secondary | ICD-10-CM

## 2016-06-22 DIAGNOSIS — C787 Secondary malignant neoplasm of liver and intrahepatic bile duct: Secondary | ICD-10-CM

## 2016-06-22 DIAGNOSIS — Z5111 Encounter for antineoplastic chemotherapy: Secondary | ICD-10-CM

## 2016-06-22 DIAGNOSIS — R1900 Intra-abdominal and pelvic swelling, mass and lump, unspecified site: Secondary | ICD-10-CM

## 2016-06-22 DIAGNOSIS — T7840XA Allergy, unspecified, initial encounter: Secondary | ICD-10-CM

## 2016-06-22 DIAGNOSIS — T451X5A Adverse effect of antineoplastic and immunosuppressive drugs, initial encounter: Secondary | ICD-10-CM

## 2016-06-22 DIAGNOSIS — Z7189 Other specified counseling: Secondary | ICD-10-CM

## 2016-06-22 LAB — COMPREHENSIVE METABOLIC PANEL
ALBUMIN: 3.5 g/dL (ref 3.5–5.0)
ALT: 88 U/L — AB (ref 0–55)
ANION GAP: 8 meq/L (ref 3–11)
AST: 53 U/L — AB (ref 5–34)
Alkaline Phosphatase: 110 U/L (ref 40–150)
BILIRUBIN TOTAL: 0.54 mg/dL (ref 0.20–1.20)
BUN: 18.7 mg/dL (ref 7.0–26.0)
CALCIUM: 9.4 mg/dL (ref 8.4–10.4)
CO2: 27 mEq/L (ref 22–29)
CREATININE: 1.1 mg/dL (ref 0.7–1.3)
Chloride: 104 mEq/L (ref 98–109)
EGFR: 71 mL/min/{1.73_m2} — ABNORMAL LOW (ref >90)
Glucose: 124 mg/dl (ref 70–140)
Potassium: 4.3 mEq/L (ref 3.5–5.1)
Sodium: 138 mEq/L (ref 136–145)
TOTAL PROTEIN: 7.3 g/dL (ref 6.4–8.3)

## 2016-06-22 LAB — CBC WITH DIFFERENTIAL/PLATELET
BASO%: 0.4 % (ref 0.0–2.0)
Basophils Absolute: 0 10*3/uL (ref 0.0–0.1)
EOS%: 4.5 % (ref 0.0–7.0)
Eosinophils Absolute: 0.2 10*3/uL (ref 0.0–0.5)
HEMATOCRIT: 43.3 % (ref 38.4–49.9)
HEMOGLOBIN: 14.9 g/dL (ref 13.0–17.1)
LYMPH#: 1.6 10*3/uL (ref 0.9–3.3)
LYMPH%: 30.6 % (ref 14.0–49.0)
MCH: 33 pg (ref 27.2–33.4)
MCHC: 34.4 g/dL (ref 32.0–36.0)
MCV: 95.8 fL (ref 79.3–98.0)
MONO#: 0.7 10*3/uL (ref 0.1–0.9)
MONO%: 13.8 % (ref 0.0–14.0)
NEUT%: 50.7 % (ref 39.0–75.0)
NEUTROS ABS: 2.6 10*3/uL (ref 1.5–6.5)
PLATELETS: 181 10*3/uL (ref 140–400)
RBC: 4.52 10*6/uL (ref 4.20–5.82)
RDW: 14 % (ref 11.0–14.6)
WBC: 5.1 10*3/uL (ref 4.0–10.3)

## 2016-06-22 MED ORDER — DEXTROSE 5 % IV SOLN
Freq: Once | INTRAVENOUS | Status: AC
  Administered 2016-06-22: 11:00:00 via INTRAVENOUS

## 2016-06-22 MED ORDER — SODIUM CHLORIDE 0.9% FLUSH
10.0000 mL | INTRAVENOUS | Status: DC | PRN
  Administered 2016-06-22: 10 mL
  Filled 2016-06-22: qty 10

## 2016-06-22 MED ORDER — HEPARIN SOD (PORK) LOCK FLUSH 100 UNIT/ML IV SOLN
500.0000 [IU] | Freq: Once | INTRAVENOUS | Status: AC | PRN
  Administered 2016-06-22: 500 [IU]
  Filled 2016-06-22: qty 5

## 2016-06-22 MED ORDER — LORATADINE 10 MG PO TABS
10.0000 mg | ORAL_TABLET | Freq: Once | ORAL | Status: AC
  Administered 2016-06-22: 10 mg via ORAL
  Filled 2016-06-22: qty 1

## 2016-06-22 MED ORDER — DEXAMETHASONE SODIUM PHOSPHATE 10 MG/ML IJ SOLN
10.0000 mg | Freq: Once | INTRAMUSCULAR | Status: AC
  Administered 2016-06-22: 10 mg via INTRAVENOUS

## 2016-06-22 MED ORDER — DEXAMETHASONE SODIUM PHOSPHATE 10 MG/ML IJ SOLN: 10.0000 mg | Freq: Once | INTRAMUSCULAR | Status: DC

## 2016-06-22 MED ORDER — METHYLPREDNISOLONE SODIUM SUCC 125 MG IJ SOLR
125.0000 mg | Freq: Once | INTRAMUSCULAR | Status: AC
  Administered 2016-06-22: 125 mg via INTRAVENOUS

## 2016-06-22 MED ORDER — DIPHENHYDRAMINE HCL 50 MG/ML IJ SOLN
50.0000 mg | Freq: Once | INTRAMUSCULAR | Status: AC
  Administered 2016-06-22: 50 mg via INTRAVENOUS

## 2016-06-22 MED ORDER — SODIUM CHLORIDE 0.9 % IV SOLN: 10.0000 mg | Freq: Once | INTRAVENOUS | Status: DC

## 2016-06-22 MED ORDER — FAMOTIDINE IN NACL 20-0.9 MG/50ML-% IV SOLN
20.0000 mg | Freq: Once | INTRAVENOUS | Status: AC
  Administered 2016-06-22: 20 mg via INTRAVENOUS

## 2016-06-22 MED ORDER — CAPECITABINE 500 MG PO TABS: ORAL_TABLET | ORAL | 0 refills | Status: DC

## 2016-06-22 MED ORDER — PALONOSETRON HCL INJECTION 0.25 MG/5ML
0.2500 mg | Freq: Once | INTRAVENOUS | Status: AC
  Administered 2016-06-22: 0.25 mg via INTRAVENOUS

## 2016-06-22 MED ORDER — PALONOSETRON HCL INJECTION 0.25 MG/5ML
INTRAVENOUS | Status: AC
  Filled 2016-06-22: qty 5

## 2016-06-22 MED ORDER — OXALIPLATIN CHEMO INJECTION 100 MG/20ML
104.0000 mg/m2 | Freq: Once | INTRAVENOUS | Status: AC
  Administered 2016-06-22: 250 mg via INTRAVENOUS
  Filled 2016-06-22: qty 40

## 2016-06-22 MED ORDER — DEXAMETHASONE SODIUM PHOSPHATE 10 MG/ML IJ SOLN
INTRAMUSCULAR | Status: AC
  Filled 2016-06-22: qty 1

## 2016-06-22 NOTE — Progress Notes (Signed)
During infusion patient complained of "itching hands and warm face, feeling hot". Infusion stopped, fluids started, Selena Lesser NP came to see patient in infusion room. Administered medications as charted. Noted rapid improvement in itching and redness, however quickly returned. Selena Lesser NP returned to infusion room to assess, additional orders received and medication administered as charted. Dr Benay Spice assessed patient in infusion room, patient not re-challenged with Oxaliplatin at this time. Patient discharged home in stable condition. Dr Benay Spice will contact patient regarding future treatment.

## 2016-06-22 NOTE — Progress Notes (Signed)
Oncology Nurse Navigator Documentation  Oncology Nurse Navigator Flowsheets 06/22/2016  Navigator Location [facility]  Navigator Encounter Type Treatment  Patient Visit Type MedOnc  Treatment Phase Active Tx--CAPOX #1  Barriers/Navigation Needs No barriers at this time;No Questions;No Needs  Interventions None required  Support Groups/Services -  [employer] -  Time Spent with Patient 15  Reports zero copay on his Xeloda. Feels well and still working when weather is cooperative.

## 2016-06-22 NOTE — Patient Instructions (Signed)
[facility] Discharge Instructions for Patients Receiving Chemotherapy  Today you received the following chemotherapy agents:  Oxaliplatin  To help prevent nausea and vomiting after your treatment, we encourage you to take your nausea medication as prescribed.   If you develop nausea and vomiting that is not controlled by your nausea medication, call the clinic.   BELOW ARE SYMPTOMS THAT SHOULD BE REPORTED IMMEDIATELY:  *FEVER GREATER THAN 100.5 F  *CHILLS WITH OR WITHOUT FEVER  NAUSEA AND VOMITING THAT IS NOT CONTROLLED WITH YOUR NAUSEA MEDICATION  *UNUSUAL SHORTNESS OF BREATH  *UNUSUAL BRUISING OR BLEEDING  TENDERNESS IN MOUTH AND THROAT WITH OR WITHOUT PRESENCE OF ULCERS  *URINARY PROBLEMS  *BOWEL PROBLEMS  UNUSUAL RASH Items with * indicate a potential emergency and should be followed up as soon as possible.  Feel free to call the clinic you have any questions or concerns. The clinic phone number is [phone].  Please show the CHEMO ALERT CARD at check-in to the Emergency Department and triage nurse.   

## 2016-06-22 NOTE — Telephone Encounter (Signed)
Per 1/15 LOS I have scheduled appts and gave calendar

## 2016-06-22 NOTE — Addendum Note (Signed)
Addended by: Jethro Bolus A on: 06/22/2016 12:26 PM   Modules accepted: Orders

## 2016-06-22 NOTE — Progress Notes (Signed)
DISCONTINUE ON PATHWAY REGIMEN - Colorectal  MCROS14: CapeOx q21 Days   A cycle is every 21 days:     Capecitabine (Xeloda(R)) 850 mg/m2 orally twice a day days 1 through 14 followed by 7 days off Dose Mod: None     Oxaliplatin (Eloxatin(R)) 130 mg/m2 in 250 mL D5W IV over 2 hours day 1 every 21 days Dose Mod: None  **Always confirm dose/schedule in your pharmacy ordering system**    REASON: Other Reason PRIOR TREATMENT: MCROS14: CapeOx q21 Days TREATMENT RESPONSE: Stable Disease (SD)  START ON PATHWAY REGIMEN - Colorectal  MCROS14: CapeOx q21 Days   A cycle is every 21 days:     Capecitabine (Xeloda(R)) 850 mg/m2 orally twice a day days 1 through 14 followed by 7 days off Dose Mod: None     Oxaliplatin (Eloxatin(R)) 130 mg/m2 in 250 mL D5W IV over 2 hours day 1 every 21 days Dose Mod: None  **Always confirm dose/schedule in your pharmacy ordering system**    Patient Characteristics: Metastatic Colorectal, Second Line, KRAS Mutation Positive/Unknown, BRAF Wild-Type/Unknown, Bevacizumab Ineligible Check here if patient was staged using an edition prior to AJCC Staging - 8th Edition (i.e., prior to June 08, 2016)? false Current evidence of distant metastases? Yes AJCC T Category: TX AJCC N Category: NX AJCC M Category: M1b AJCC 8 Stage Grouping: IVB BRAF Mutation Status: Did Not Order Test KRAS/NRAS Mutation Status: Mutation Positive Line of therapy: Second Line Would you be surprised if this patient died  in the next year? I would NOT be surprised if this patient died in the next year  Intent of Therapy: Non-Curative / Palliative Intent, Discussed with Patient

## 2016-06-22 NOTE — Progress Notes (Addendum)
[place] OFFICE PROGRESS NOTE   Diagnosis:  Colon cancer  INTERVAL HISTORY:   Bryan Fields returns as scheduled. He completed cycle 1 FOLFOX 06/03/2016. He had mild nausea. No vomiting. No symmetrical mouth sores. No diarrhea. Cold sensitivity lasted 3 or 4 days. No persistent neuropathy symptoms. He thinks the abdominal wall mass has progressed.  He has received Xeloda. He took the first dose earlier this morning.  Objective:  Vital signs in last 24 hours:  Blood pressure 134/75, pulse (!) 59, temperature 98.7 F (37.1 C), temperature source Oral, resp. rate 18, height _0  (1.803 m), weight 251 lb 12.8 oz (114.2 kg), SpO2 99 %.    HEENT: No thrush or ulcers. Resp: Lungs clear bilaterally. Cardio: Regular rate and rhythm. GI: No hepatomegaly. Large mid upper abdominal wall mass with 3 distinct areas of ulceration. Vascular: No leg edema. Port-A-Cath without erythema.  Lab Results:  Lab Results  Component Value Date   WBC 5.1 06/22/2016   HGB 14.9 06/22/2016   HCT 43.3 06/22/2016   MCV 95.8 06/22/2016   PLT 181 06/22/2016   NEUTROABS 2.6 06/22/2016    Imaging:  No results found.  Medications: I have reviewed the patient's current medications.  Assessment/Plan: 1. 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 Microsatellite stable; preserved expression of major and minor MMR proteins. He began treatment with FOLFOX in December 2011. He completed cycle #12 on 10/27/2010. Oxaliplatin was held with cycles number 7 through 10 and resumed with cycle #11. 2. History of neutropenia secondary to chemotherapy. 3. History of thrombocytopenia secondary chemotherapy. 4. He did not undergo a preoperative colonoscopy. He underwent a colonoscopy on 12/19/2010 with findings of a 1 cm polyp at 90 cm from the anal verge, minimal polyp at 30 cm from the anal  verge and minimal diverticulosis. Colonoscopy 03/18/2012-negative. 5. 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 He began study drug on 05/13/2010. 6. Status post Port-A-Cath placement. The Port-A-Cath has been removed. 7. 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 9. Low attenuation lesion in the caudate lobe of the liver on the CT 04/11/2012-? Cyst. MRI liver on 04/20/2012 favored the caudate lobe liver lesion to represent a cyst or complex cyst. CT abdomen/pelvis 04/17/2013 with continued enlargement of hypovascular lesion in the caudate lobe of the liver.  PET scan 10/27/2011 confirmed a hypermetabolic caudate lobe liver lesion, tiny indeterminate lung nodules, and a 9 mm level II left neck node   CT biopsy of the liver lesion 11/07/2013 confirmed adenocarcinoma  Left liver and caudate resection 01/15/2014-pathology confirmed metastatic colon cancer, and negative surgical margins  Surveillance CT scans 07/24/2014 consistent with recurrent disease involving upper abdominal lymph nodes, peritoneal implants, and an anterior abdominal wall mass  Status post biopsy of the anterior abdominal wall mass 08/06/2014 with pathology showing metastatic adenocarcinoma consistent with a colon primary.  [id number] 1/28  Cycle 1 FOLFIRI/Avastin on the [facility] 1317 08/20/2014  Cycle 2 FOLFIRI/Avastin 09/03/2014  Cycle 3 held on 09/17/2014 due to neutropenia  Cycle 3 FOLFIRI/Avastin 09/25/2014. Neulasta added beginning with cycle 3.  Cycle 4 FOLFIRI/Avastin 10/08/2014  Restaging CT scans 10/18/2014 with slight interval increase in the size of the soft tissue mass in the subcutaneous fat of the epigastric region. Underlying peritoneal implants, probable focus of residual  disease along the resected margin of the liver and hepatoduodenal  ligament lymphadenopathy all appeared slightly improved. No new sites of metastatic disease otherwise noted. New left lower lobe bronchopneumonia.  Cycle 5 FOLFIRI/Avastin 10/22/2014  Cycle 6 FOLFIRI/Avastin 11/06/2014  Cycle 7 FOLFIRI Avastin 11/19/2014  Cycle 8 FOLFIRI/Avastin 12/03/2014  CT 12/14/2014 with stable disease  Cycle 9 FOLFIRI/Avastin 12/17/2014  Cycle 10 FOLFIRI/Avastin 01/07/2015 (5-fluorouracil and leucovorin dose reduced)  Cycle 11 FOLFIRI/Avastin 01/21/2015  Cycle 12 FOLFIRI/Avastin 02/04/2015  CT 02/13/2015 with stable disease  Cycle 13 FOLFIRI/Avastin 02/25/2015-changed to an every three-week protocol, now off study  Avastin held with cycle 14 FOLFIRI on 03/25/2015 secondary to gingivitis  Restaging CTs 05/20/2015-slight increase in the size of the epigastric subcutaneous mass, stable peritoneal implants  FOLFIRI continued on a 3 week schedule  Avastin resumed 06/17/2015  CTs 12/23/2015-stable subxiphoid mass, stable low-attenuation mass adjacent to the left liver surgical margin, peritoneal lesions-stable  FOLFIRI continued on a 4 week schedule.  Avastin placed on hold 01/06/2016  FOLFIRI discontinued 03/02/2016 secondary to enlargement of the abdominal wall mass with new superficial fungating nodules involving the skin  Palliative radiation to the abdominal wall mass completed 03/20/2016  Cycle 1 FOLFOX 06/03/2016  Cycle 2 CAPOX 06/22/2016 10. Iron deposition within the liver noted on MRI 04/20/2012. The ferritin level was normal on 04/17/2013. Increased iron deposition noted on the left liver resection 01/15/2014 11. Pain/bleeding at the anus reported when here 04/24/2013. Status post evaluation by Dr. [name] with findings of an anal fissure. 12. Right face cystic lesion 13. Status post Port-A-Cath placement 08/14/2014 14. Delayed nausea following FOLFIRI, prophylactic Decadron added with cycle 2 with improvement. 15. Chest CT 10/18/2014 with  new left lower lobe bronchopneumonia. Levaquin initiated. Resolved on CT 12/14/2014 16. Gum pain-mucositis?, Gingivitis?-Improved with discontinuation of Avastin 17. Pain/tenderness, mild erythema and full appearance over the maxillary sinuses. Maxillofacial CT 01/30/2015 with mild to moderate bilateral maxillary and sphenoid sinus inflammatory changes. Multifocal right maxillary dental periapical lucency. Status post a right upper tooth extraction with resolution of the pain.   Disposition: Mr. Stancil appears stable. He has completed 1 cycle of FOLFOX. He was able to obtain Xeloda. The plan is to adjust treatment to the CAPOX regimen with cycle 2 today.  He will return for a follow-up visit and cycle 3 in 3 weeks. He will contact the office in the interim with any problems.  Patient seen with Dr. Benay Spice.    Bryan Fields ANP/GNP-BC   06/22/2016  10:33 AM  This was a shared visit with Bryan Fields. Mr. Shea was interviewed and examined. The overall size of the abdominal wall mass appears unchanged, but there is increased superficial necrosis. He will begin a second cycle of oxaliplatin based therapy with CAPOX today. I recommend placing the tooth extractions on hold.  Bryan Fields, M.D.  Addendum: Mr. Alldredge developed an acute reaction a few minutes into the oxaliplatin infusion consisting of pruritus, urticaria, erythema, and hypotension. His symptoms resolved with discontinuation of the oxaliplatin and treatment with Solu-Medrol, Pepcid, loratadine, and diphenhydramine. We elected to not rechallenge Mr. Hosick with oxaliplatin today.  He will discontinue Xeloda.  Treatment options for the metastatic colon cancer are very limited. I will discuss rechallenge options with the [facility]. We will be in touch with Mr. Neyhart within the next few days.  Bryan Fields, M.D.

## 2016-06-22 NOTE — Telephone Encounter (Signed)
Message sent to chemo scheduler to be added per 06/22/16 los. Appointments scheduled per 06/22/16 los. Patient was given a copy of the AVS report and appointment schedule, per 06/22/16 los.  °

## 2016-06-23 ENCOUNTER — Encounter: Payer: Self-pay | Admitting: Nurse Practitioner

## 2016-06-23 DIAGNOSIS — T7840XA Allergy, unspecified, initial encounter: Secondary | ICD-10-CM | POA: Insufficient documentation

## 2016-06-23 NOTE — Assessment & Plan Note (Signed)
Patient presented to the [place] today to receive his first cycle of Capeox therapy.  He reports that he started his Xeloda oral therapy this morning as well.  Patient had received a small amount of the oxaliplatin infusion; when he developed a hypersensitivity reaction which consisted of rash./Redness primarily around his neck with intense pruritus to the rash around his neck and to his bilateral hands.  The infusion was held; the patient received Benadryl, Pepcid, and Solu-Medrol IV.  All symptoms completely resolved.  Dr. Benay Spice made the decision to hold any further oxaliplatin infusion due to patient's reaction.  Dr. Benay Spice will review further treatment options with the patient.  Patient is scheduled to return on 3/5 2017 for labs, flush, visit, and his next cycle of chemotherapy.
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
## 2016-06-23 NOTE — Assessment & Plan Note (Signed)
Patient presented to the [place] today to receive his first cycle of Capeox therapy.  He reports that he started his Xeloda oral therapy this morning as well.  Patient had received a small amount of the oxaliplatin infusion; when he developed a hypersensitivity reaction.  See further notes for details.  Patient is scheduled to return on 3/5 2017 for labs, flush, visit, and his next cycle of chemotherapy.

## 2016-06-29 ENCOUNTER — Telehealth: Payer: Self-pay | Admitting: *Deleted

## 2016-06-29 MED ORDER — DEXAMETHASONE 4 MG PO TABS: 10.0000 mg | ORAL_TABLET | Freq: Two times a day (BID) | ORAL | 0 refills | Status: DC

## 2016-06-29 NOTE — Telephone Encounter (Signed)
Per Dr. Benay Spice: Plan to re-challenge with Oxaliplatin. Decadron pre-med to start day prior to chemo. Infusion will be diluted and infused slowly. Called pt's wife with instructions and plan, she voiced understanding. Message to schedulers for appt.

## 2016-06-29 NOTE — Telephone Encounter (Signed)
Message from Dr. Caesar Bookman assistant requesting diagnosis code and clinical information. They are trying to assist patient with getting extractions covered by his medical insurance. 1/15 office encounter faxed.

## 2016-06-30 ENCOUNTER — Telehealth: Payer: Self-pay | Admitting: Oncology

## 2016-06-30 NOTE — Telephone Encounter (Signed)
lvm to inform pt of 1/30 appt date/time per LOS

## 2016-07-03 ENCOUNTER — Telehealth: Payer: Self-pay | Admitting: *Deleted

## 2016-07-03 ENCOUNTER — Telehealth: Payer: Self-pay | Admitting: Pharmacist

## 2016-07-03 NOTE — Telephone Encounter (Signed)
Open by mistake

## 2016-07-03 NOTE — Telephone Encounter (Signed)
Oral Chemotherapy Pharmacist Encounter Attempted to reach patient for follow up on oral medication: Xeloda. No answer. Left VM for patient to call back with any questions or issues.   Thank you,  Nuala Alpha, PharmD, [facility] [phone]

## 2016-07-03 NOTE — Telephone Encounter (Signed)
Called pt, reviewed Decadron pre-med instructions. Confirmed appointment time for 1/30. Pt requests port access for lab. Message to schedulers to add flush appt.

## 2016-07-03 NOTE — Telephone Encounter (Signed)
Per desk RN I have moved appts to an earlier time. I called and left a message on the machine for the patient to call me back

## 2016-07-06 ENCOUNTER — Other Ambulatory Visit: Payer: Self-pay | Admitting: Oncology

## 2016-07-07 ENCOUNTER — Ambulatory Visit: Payer: 59

## 2016-07-07 ENCOUNTER — Other Ambulatory Visit (HOSPITAL_BASED_OUTPATIENT_CLINIC_OR_DEPARTMENT_OTHER): Payer: 59

## 2016-07-07 ENCOUNTER — Encounter: Payer: Self-pay | Admitting: Nurse Practitioner

## 2016-07-07 ENCOUNTER — Ambulatory Visit (HOSPITAL_BASED_OUTPATIENT_CLINIC_OR_DEPARTMENT_OTHER): Payer: 59 | Admitting: Nurse Practitioner

## 2016-07-07 DIAGNOSIS — Z95828 Presence of other vascular implants and grafts: Secondary | ICD-10-CM

## 2016-07-07 DIAGNOSIS — C787 Secondary malignant neoplasm of liver and intrahepatic bile duct: Secondary | ICD-10-CM

## 2016-07-07 DIAGNOSIS — C189 Malignant neoplasm of colon, unspecified: Secondary | ICD-10-CM

## 2016-07-07 DIAGNOSIS — C182 Malignant neoplasm of ascending colon: Secondary | ICD-10-CM

## 2016-07-07 DIAGNOSIS — R1900 Intra-abdominal and pelvic swelling, mass and lump, unspecified site: Secondary | ICD-10-CM

## 2016-07-07 LAB — CBC WITH DIFFERENTIAL/PLATELET
BASO%: 0.2 % (ref 0.0–2.0)
Basophils Absolute: 0 10*3/uL (ref 0.0–0.1)
EOS%: 0 % (ref 0.0–7.0)
Eosinophils Absolute: 0 10*3/uL (ref 0.0–0.5)
HCT: 44.7 % (ref 38.4–49.9)
HEMOGLOBIN: 15.4 g/dL (ref 13.0–17.1)
LYMPH%: 7.7 % — AB (ref 14.0–49.0)
MCH: 33.4 pg (ref 27.2–33.4)
MCHC: 34.4 g/dL (ref 32.0–36.0)
MCV: 97 fL (ref 79.3–98.0)
MONO#: 0.2 10*3/uL (ref 0.1–0.9)
MONO%: 2 % (ref 0.0–14.0)
NEUT#: 11.1 10*3/uL — ABNORMAL HIGH (ref 1.5–6.5)
NEUT%: 90.1 % — ABNORMAL HIGH (ref 39.0–75.0)
Platelets: 214 10*3/uL (ref 140–400)
RBC: 4.61 10*6/uL (ref 4.20–5.82)
RDW: 13.9 % (ref 11.0–14.6)
WBC: 12.3 10*3/uL — AB (ref 4.0–10.3)
lymph#: 0.9 10*3/uL (ref 0.9–3.3)

## 2016-07-07 LAB — COMPREHENSIVE METABOLIC PANEL
ALBUMIN: 3.8 g/dL (ref 3.5–5.0)
ALK PHOS: 73 U/L (ref 40–150)
ALT: 47 U/L (ref 0–55)
AST: 32 U/L (ref 5–34)
Anion Gap: 9 mEq/L (ref 3–11)
BUN: 24.2 mg/dL (ref 7.0–26.0)
CHLORIDE: 104 meq/L (ref 98–109)
CO2: 22 meq/L (ref 22–29)
Calcium: 9.8 mg/dL (ref 8.4–10.4)
Creatinine: 1.2 mg/dL (ref 0.7–1.3)
EGFR: 70 mL/min/{1.73_m2} — AB (ref >90)
GLUCOSE: 140 mg/dL (ref 70–140)
POTASSIUM: 4.5 meq/L (ref 3.5–5.1)
SODIUM: 135 meq/L — AB (ref 136–145)
Total Bilirubin: 0.53 mg/dL (ref 0.20–1.20)
Total Protein: 8.2 g/dL (ref 6.4–8.3)

## 2016-07-07 MED ORDER — SODIUM CHLORIDE 0.9 % IJ SOLN
10.0000 mL | INTRAMUSCULAR | Status: DC | PRN
  Administered 2016-07-07: 10 mL via INTRAVENOUS
  Filled 2016-07-07: qty 10

## 2016-07-07 MED ORDER — SODIUM CHLORIDE 0.9 % IJ SOLN
10.0000 mL | INTRAMUSCULAR | Status: DC | PRN
  Filled 2016-07-07: qty 10

## 2016-07-07 MED ORDER — HEPARIN SOD (PORK) LOCK FLUSH 100 UNIT/ML IV SOLN
500.0000 [IU] | Freq: Once | INTRAVENOUS | Status: AC | PRN
  Administered 2016-07-07: 500 [IU] via INTRAVENOUS
  Filled 2016-07-07: qty 5

## 2016-07-07 NOTE — Progress Notes (Signed)
Dressing to external abdominal tumor removed by MDs. Redressed with telfa gauze and hypafix tape. Used skin protectant prior to tape.  Supplies of telfa pads, hypafix tape, skin protectant and adhesive remover given to patient for changing dressings at home.

## 2016-07-07 NOTE — Assessment & Plan Note (Signed)
Patient presented to the [place] today to receive his second cycle of oxaliplatin chemotherapy regimen.  Patient also has Xeloda oral therapy at home that he was planning to initiate as well.  Patient experienced a hypersensitivity reaction to the oxaliplatin infusion with his first cycle which consisted of flushing, redness around his neck and overall/generalized itching.  He had no throat scratchiness, shortness of breath, chest pain, or other serious reaction symptoms.    Patient states he's been feeling well; but does feel that the lesion to his upper abdomen has grown.  He states that the lesion is now protruding more and has scabbed areas.  He states that the scabbed areas will sometimes open and drain.  He has been keeping a dressing on the site.  Dr. Benay Spice in to examine the patient as well.  Patient is noted have a very large, protruding mass to his upper abdomen with the vertical dimensions of 7.0 cm in the horizontal dimensions of 9.5 cm.  Portions of the mass have scabbed areas.  There is no obvious infection to the site.  Dr. Benay Spice confirmed that the abdominal mass is indeed much larger than on previous exam.  Dr. Benay Spice had a long discussion with both patient and his wife regarding further options of care.  He made the decision with the patient to hold any further oxaliplatin chemotherapy today.  Patient was also instructed to hold the Xeloda oral therapy as well.  Dr. Benay Spice stated that he would be in contact with Dr. Barry Dienes.  Gen. surgeon to discuss options; and will also call Dr. Pecolia Ades at [facility] as well.  Advised patient and his wife that the cancer center will call to give an update and make future scheduling plans.
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
## 2016-07-08 ENCOUNTER — Telehealth: Payer: Self-pay | Admitting: *Deleted

## 2016-07-08 NOTE — Telephone Encounter (Signed)
Oncology Nurse Navigator Documentation  Oncology Nurse Navigator Flowsheets 07/08/2016  Navigator Location [facility]  Navigator Encounter Type Telephone  Telephone Outgoing Call;Appt Confirmation/Clarification  Patient Visit Type -  Treatment Phase -  Barriers/Navigation Needs Coordination of Care--referral to [facility]  Interventions Coordination of Care  Coordination of Care Appts--called patient w/appointment to see Dr. Barry Dienes in [facility] [place] on 07/10/16 at 1130.  Support Groups/Services -  Acuity Level 2  Time Spent with Patient 15  Dr. Benay Spice presented case in GI conference today. Will see Dr. Barry Dienes on 07/10/16 to consider resection of abdominal wall mass. Dr. Benay Spice is also researching potential clinical trial at [facility] per Dr. Aleatha Borer. Demographic information sent to [facility] and notified Dr. Marlowe Aschoff nurse of appointment.

## 2016-07-09 ENCOUNTER — Telehealth: Payer: Self-pay | Admitting: Oncology

## 2016-07-09 NOTE — Telephone Encounter (Signed)
sw pt to confirm 2/13 appt at 9 am per LOS

## 2016-07-10 ENCOUNTER — Encounter: Payer: Self-pay | Admitting: *Deleted

## 2016-07-10 NOTE — Progress Notes (Signed)
Oncology Nurse Navigator Documentation  Oncology Nurse Navigator Flowsheets 07/10/2016  Navigator Location [facility]  Navigator Encounter Type Clinic/[facility]  Telephone -  [facility] Date 07/10/2016  Patient Visit Type Surgery  Treatment Phase Other  Barriers/Navigation Needs Coordination of Care--Has question about his schedule--need for flush and OV/chemo  Interventions Coordination of Care  Coordination of Care Appts--printed schedule and placed on MD desk for review and corrections needed  Support Groups/Services GI Support Group  Acuity Level 1  Time Spent with Patient 44  Was seen in clinic today by Dr. Barry Dienes to evaluate for surgical removal of the abdominal mass/mets. Dr. Barry Dienes will also be referring him to plastic surgeon to assist in the procedure. Provided him with printed info from Dr. Barry Dienes.

## 2016-07-13 ENCOUNTER — Ambulatory Visit: Payer: 59 | Admitting: Nurse Practitioner

## 2016-07-13 ENCOUNTER — Ambulatory Visit: Payer: 59

## 2016-07-13 ENCOUNTER — Other Ambulatory Visit: Payer: 59

## 2016-07-14 ENCOUNTER — Telehealth: Payer: Self-pay | Admitting: *Deleted

## 2016-07-14 NOTE — Telephone Encounter (Signed)
Message received from patient stating that he received a call from [facility] and would like to know if he needs to make this appt.

## 2016-07-14 NOTE — Telephone Encounter (Signed)
Patient notified per Dr. Benay Spice that Dr. Benay Spice does wish for patient to follow up with Dr. Sigurd Sos at [facility].  Patient states that he will f/u with her office tomorrow.  Patient states that he is having "deep itching to his hands and feet for the last two nights" that does not decrease with Benadryl.  Will notify Dr. Benay Spice and call pt back with MD instructions.

## 2016-07-14 NOTE — Telephone Encounter (Signed)
Call placed back to patient.  Patient denies any jaundice.  Patient informed per Dr. Benay Spice to continue Benadryl as tolerated.  Patient appreciative of call back.

## 2016-07-15 ENCOUNTER — Encounter (HOSPITAL_COMMUNITY): Payer: Self-pay

## 2016-07-19 NOTE — Pre-Procedure Instructions (Signed)
Bryan Fields  07/19/2016      [id number] [place], [place] - [address] [place] [address] Phone: [phone] Fax: [phone]  [place], [place] [address] [place] [address] Phone: [phone] Fax: [phone]    Your procedure is scheduled on February 15  Report to [place] at 1115 A.M.  Call this number if you have problems the morning of surgery:  [phone]   Remember:  Do not eat food or drink liquids after midnight.   Take these medicines the morning of surgery with A SIP OF WATER atenolol (TENORMIN), loratadine (CLARITIN),  omeprazole (PRILOSEC), oxyCODONE-acetaminophen (PERCOCET/ROXICET)  7 days prior to surgery STOP taking any Aspirin, Aleve, Naproxen, Ibuprofen, Motrin, Advil, Goody's, BC's, all herbal medications, fish oil, and all vitamins    Do not wear jewelry.  Do not wear lotions, powders, or cologne, or deoderant.  Men may shave face and neck.  Do not bring valuables to the hospital.  [facility] is not responsible for any belongings or valuables.  Contacts, dentures or bridgework may not be worn into surgery.  Leave your suitcase in the car.  After surgery it may be brought to your room.  For patients admitted to the hospital, discharge time will be determined by your treatment team.  Patients discharged the day of surgery will not be allowed to drive home.    Special instructions:   [facility]- Preparing For Surgery  Before surgery, you can play an important role. Because skin is not sterile, your skin needs to be as free of germs as possible. You can reduce the number of germs on your skin by washing with CHG (chlorahexidine gluconate) Soap before surgery.  CHG is an antiseptic cleaner which kills germs and bonds with the skin to continue killing germs even after washing.  Please  do not use if you have an allergy to CHG or antibacterial soaps. If your skin becomes reddened/irritated stop using the CHG.  Do not shave (including legs and underarms) for at least 48 hours prior to first CHG shower. It is OK to shave your face.  Please follow these instructions carefully.   1. Shower the NIGHT BEFORE SURGERY and the MORNING OF SURGERY with CHG.   2. If you chose to wash your hair, wash your hair first as usual with your normal shampoo.  3. After you shampoo, rinse your hair and body thoroughly to remove the shampoo.  4. Use CHG as you would any other liquid soap. You can apply CHG directly to the skin and wash gently with a scrungie or a clean washcloth.   5. Apply the CHG Soap to your body ONLY FROM THE NECK DOWN.  Do not use on open wounds or open sores. Avoid contact with your eyes, ears, mouth and genitals (private parts). Wash genitals (private parts) with your normal soap.  6. Wash thoroughly, paying special attention to the area where your surgery will be performed.  7. Thoroughly rinse your body with warm water from the neck down.  8. DO NOT shower/wash with your normal soap after using and rinsing off the CHG Soap.  9. Pat yourself dry with a CLEAN TOWEL.   10. Wear CLEAN PAJAMAS   11. Place CLEAN SHEETS on your bed the night of your first shower and DO NOT SLEEP WITH PETS.  Day of Surgery: Do not apply any deodorants/lotions. Please wear clean clothes to the hospital/surgery center.      Please read over the following fact sheets that you were given.

## 2016-07-20 ENCOUNTER — Encounter (HOSPITAL_COMMUNITY): Payer: Self-pay

## 2016-07-20 ENCOUNTER — Encounter (HOSPITAL_COMMUNITY)
Admission: RE | Admit: 2016-07-20 | Discharge: 2016-07-20 | Disposition: A | Discharge status: Home / Self Care | Payer: 59 | Source: Ambulatory Visit | Attending: General Surgery | Admitting: General Surgery

## 2016-07-20 DIAGNOSIS — I10 Essential (primary) hypertension: Secondary | ICD-10-CM

## 2016-07-20 DIAGNOSIS — Z01812 Encounter for preprocedural laboratory examination: Secondary | ICD-10-CM | POA: Insufficient documentation

## 2016-07-20 DIAGNOSIS — Z01810 Encounter for preprocedural cardiovascular examination: Secondary | ICD-10-CM | POA: Insufficient documentation

## 2016-07-20 LAB — BASIC METABOLIC PANEL
ANION GAP: 7 (ref 5–15)
BUN: 16 mg/dL (ref 6–20)
CO2: 25 mmol/L (ref 22–32)
Calcium: 9.1 mg/dL (ref 8.9–10.3)
Chloride: 106 mmol/L (ref 101–111)
Creatinine, Ser: 1.42 mg/dL — ABNORMAL HIGH (ref 0.61–1.24)
GFR calc Af Amer: >60 mL/min (ref >60)
GFR calc non Af Amer: 53 mL/min — ABNORMAL LOW (ref >60)
GLUCOSE: 92 mg/dL (ref 65–99)
Potassium: 4.3 mmol/L (ref 3.5–5.1)
Sodium: 138 mmol/L (ref 135–145)

## 2016-07-20 LAB — CBC
HEMATOCRIT: 44.5 % (ref 39.0–52.0)
HEMOGLOBIN: 15.3 g/dL (ref 13.0–17.0)
MCH: 33.8 pg (ref 26.0–34.0)
MCHC: 34.4 g/dL (ref 30.0–36.0)
MCV: 98.2 fL (ref 78.0–100.0)
Platelets: 194 10*3/uL (ref 150–400)
RBC: 4.53 MIL/uL (ref 4.22–5.81)
RDW: 13.9 % (ref 11.5–15.5)
WBC: 9.2 10*3/uL (ref 4.0–10.5)

## 2016-07-20 NOTE — Progress Notes (Signed)
PCP - Ria Bush Cardiologist -denies   Chest x-ray - not needed EKG - 07/20/16 Stress Test - denies ECHO - denies Cardiac Cath - denies Sleep Study - denies     Patient denies shortness of breath, fever, cough and chest pain at PAT appointment   Patient verbalized understanding of instructions that was given to them at the PAT appointment. Patient expressed that there were no further questions.  Patient was also instructed that they will need to review over the PAT instructions again at home before the surgery.

## 2016-07-20 NOTE — Progress Notes (Signed)
   07/20/16 1218  OBSTRUCTIVE SLEEP APNEA  Have you ever been diagnosed with sleep apnea through a sleep study? No  Do you snore loudly (loud enough to be heard through closed doors)?  0  Do you often feel tired, fatigued, or sleepy during the daytime (such as falling asleep during driving or talking to someone)? 0  Has anyone observed you stop breathing during your sleep? 0  Do you have, or are you being treated for high blood pressure? 1  BMI more than 35 kg/m2? 1  Age > 50 (1-yes) 1  Neck circumference greater than:Male 16 inches or larger, Male 17inches or larger? 1  Male Gender (Yes=1) 1  Obstructive Sleep Apnea Score 5  Score 5 or greater  Results sent to PCP

## 2016-07-21 ENCOUNTER — Other Ambulatory Visit: Payer: Self-pay | Admitting: General Surgery

## 2016-07-21 ENCOUNTER — Other Ambulatory Visit: Payer: 59

## 2016-07-21 ENCOUNTER — Ambulatory Visit: Payer: 59 | Admitting: Nurse Practitioner

## 2016-07-23 ENCOUNTER — Ambulatory Visit (HOSPITAL_COMMUNITY): Payer: 59 | Admitting: Anesthesiology

## 2016-07-23 ENCOUNTER — Inpatient Hospital Stay (HOSPITAL_COMMUNITY)
Admission: AD | Admit: 2016-07-23 | Discharge: 2016-07-26 | DRG: 580 | Disposition: A | Discharge status: Home / Self Care | Payer: 59 | Source: Ambulatory Visit | Attending: General Surgery | Admitting: General Surgery

## 2016-07-23 ENCOUNTER — Encounter (HOSPITAL_COMMUNITY): Payer: Self-pay | Admitting: Certified Registered Nurse Anesthetist

## 2016-07-23 ENCOUNTER — Encounter (HOSPITAL_COMMUNITY)
Admission: AD | Disposition: A | Discharge status: Home / Self Care | Payer: Self-pay | Source: Ambulatory Visit | Attending: General Surgery

## 2016-07-23 DIAGNOSIS — Z9221 Personal history of antineoplastic chemotherapy: Secondary | ICD-10-CM

## 2016-07-23 DIAGNOSIS — C19 Malignant neoplasm of rectosigmoid junction: Secondary | ICD-10-CM | POA: Diagnosis present

## 2016-07-23 DIAGNOSIS — Q447 Other congenital malformations of liver: Secondary | ICD-10-CM | POA: Diagnosis not present

## 2016-07-23 DIAGNOSIS — N179 Acute kidney failure, unspecified: Secondary | ICD-10-CM | POA: Diagnosis present

## 2016-07-23 DIAGNOSIS — Z9049 Acquired absence of other specified parts of digestive tract: Secondary | ICD-10-CM | POA: Diagnosis not present

## 2016-07-23 DIAGNOSIS — C7989 Secondary malignant neoplasm of other specified sites: Secondary | ICD-10-CM | POA: Diagnosis present

## 2016-07-23 DIAGNOSIS — C792 Secondary malignant neoplasm of skin: Principal | ICD-10-CM | POA: Diagnosis present

## 2016-07-23 DIAGNOSIS — C787 Secondary malignant neoplasm of liver and intrahepatic bile duct: Secondary | ICD-10-CM | POA: Diagnosis present

## 2016-07-23 DIAGNOSIS — K567 Ileus, unspecified: Secondary | ICD-10-CM | POA: Diagnosis present

## 2016-07-23 DIAGNOSIS — C189 Malignant neoplasm of colon, unspecified: Secondary | ICD-10-CM | POA: Diagnosis present

## 2016-07-23 HISTORY — DX: Gastro-esophageal reflux disease without esophagitis: K21.9

## 2016-07-23 HISTORY — PX: EXPLORATORY LAPAROTOMY: SUR591

## 2016-07-23 HISTORY — PX: LAPAROTOMY: SHX154

## 2016-07-23 HISTORY — DX: Pneumonia, unspecified organism: J18.9

## 2016-07-23 LAB — CBC
HCT: 41.2 % (ref 39.0–52.0)
Hemoglobin: 14.3 g/dL (ref 13.0–17.0)
MCH: 33.6 pg (ref 26.0–34.0)
MCHC: 34.7 g/dL (ref 30.0–36.0)
MCV: 96.7 fL (ref 78.0–100.0)
Platelets: 204 10*3/uL (ref 150–400)
RBC: 4.26 MIL/uL (ref 4.22–5.81)
RDW: 13.9 % (ref 11.5–15.5)
WBC: 11 10*3/uL — ABNORMAL HIGH (ref 4.0–10.5)

## 2016-07-23 LAB — CREATININE, SERUM
CREATININE: 1.21 mg/dL (ref 0.61–1.24)
GFR calc Af Amer: >60 mL/min (ref >60)

## 2016-07-23 SURGERY — LAPAROTOMY, EXPLORATORY
Anesthesia: General | Site: Chest

## 2016-07-23 MED ORDER — DIPHENHYDRAMINE HCL 50 MG/ML IJ SOLN: 12.5000 mg | Freq: Four times a day (QID) | INTRAMUSCULAR | Status: DC | PRN

## 2016-07-23 MED ORDER — 0.9 % SODIUM CHLORIDE (POUR BTL) OPTIME
TOPICAL | Status: DC | PRN
  Administered 2016-07-23: 1000 mL

## 2016-07-23 MED ORDER — HYDRALAZINE HCL 20 MG/ML IJ SOLN: 10.0000 mg | INTRAMUSCULAR | Status: DC | PRN

## 2016-07-23 MED ORDER — KETOROLAC TROMETHAMINE 30 MG/ML IJ SOLN
30.0000 mg | Freq: Four times a day (QID) | INTRAMUSCULAR | Status: DC | PRN
  Administered 2016-07-25 – 2016-07-26 (×4): 30 mg via INTRAVENOUS
  Filled 2016-07-23 (×4): qty 1

## 2016-07-23 MED ORDER — BUPIVACAINE HCL (PF) 0.25 % IJ SOLN
INTRAMUSCULAR | Status: AC
  Filled 2016-07-23: qty 30

## 2016-07-23 MED ORDER — MIDAZOLAM HCL 5 MG/5ML IJ SOLN
INTRAMUSCULAR | Status: DC | PRN
  Administered 2016-07-23: 2 mg via INTRAVENOUS

## 2016-07-23 MED ORDER — EPHEDRINE 5 MG/ML INJ
INTRAVENOUS | Status: AC
  Filled 2016-07-23: qty 20

## 2016-07-23 MED ORDER — METHOCARBAMOL 500 MG PO TABS
ORAL_TABLET | ORAL | Status: AC
  Filled 2016-07-23: qty 1

## 2016-07-23 MED ORDER — KCL IN DEXTROSE-NACL 20-5-0.45 MEQ/L-%-% IV SOLN
INTRAVENOUS | Status: AC
  Administered 2016-07-24: 06:00:00 via INTRAVENOUS
  Filled 2016-07-23: qty 1000

## 2016-07-23 MED ORDER — PROMETHAZINE HCL 25 MG/ML IJ SOLN: 6.2500 mg | INTRAMUSCULAR | Status: DC | PRN

## 2016-07-23 MED ORDER — FENTANYL CITRATE (PF) 100 MCG/2ML IJ SOLN
INTRAMUSCULAR | Status: AC
  Filled 2016-07-23: qty 2

## 2016-07-23 MED ORDER — POLYETHYLENE GLYCOL 3350 17 G PO PACK: 17.0000 g | PACK | Freq: Every day | ORAL | Status: DC | PRN

## 2016-07-23 MED ORDER — FENTANYL CITRATE (PF) 100 MCG/2ML IJ SOLN
INTRAMUSCULAR | Status: AC
  Filled 2016-07-23: qty 4

## 2016-07-23 MED ORDER — ACETAMINOPHEN 325 MG PO TABS
650.0000 mg | ORAL_TABLET | Freq: Four times a day (QID) | ORAL | Status: DC | PRN
  Administered 2016-07-25: 650 mg via ORAL
  Filled 2016-07-23: qty 2

## 2016-07-23 MED ORDER — CEFAZOLIN SODIUM 1 G IJ SOLR
INTRAMUSCULAR | Status: DC | PRN
  Administered 2016-07-23: 2 g via INTRAMUSCULAR

## 2016-07-23 MED ORDER — DEXAMETHASONE SODIUM PHOSPHATE 10 MG/ML IJ SOLN
INTRAMUSCULAR | Status: AC
  Filled 2016-07-23: qty 2

## 2016-07-23 MED ORDER — PHENYLEPHRINE 40 MCG/ML (10ML) SYRINGE FOR IV PUSH (FOR BLOOD PRESSURE SUPPORT)
PREFILLED_SYRINGE | INTRAVENOUS | Status: AC
  Filled 2016-07-23: qty 10

## 2016-07-23 MED ORDER — LIDOCAINE HCL (CARDIAC) 20 MG/ML IV SOLN
INTRAVENOUS | Status: DC | PRN
Start: 2016-07-23 — End: 2016-07-23
  Administered 2016-07-23: 100 mg via INTRAVENOUS

## 2016-07-23 MED ORDER — FENTANYL CITRATE (PF) 100 MCG/2ML IJ SOLN
25.0000 ug | INTRAMUSCULAR | Status: DC | PRN
  Administered 2016-07-23 (×2): 50 ug via INTRAVENOUS

## 2016-07-23 MED ORDER — LIDOCAINE 2% (20 MG/ML) 5 ML SYRINGE
INTRAMUSCULAR | Status: AC
  Filled 2016-07-23: qty 10

## 2016-07-23 MED ORDER — SIMETHICONE 80 MG PO CHEW: 40.0000 mg | CHEWABLE_TABLET | Freq: Four times a day (QID) | ORAL | Status: DC | PRN

## 2016-07-23 MED ORDER — NAPROXEN SODIUM 220 MG PO TABS: 220.0000 mg | ORAL_TABLET | Freq: Two times a day (BID) | ORAL | Status: DC | PRN

## 2016-07-23 MED ORDER — ROCURONIUM BROMIDE 100 MG/10ML IV SOLN
INTRAVENOUS | Status: DC | PRN
  Administered 2016-07-23: 50 mg via INTRAVENOUS

## 2016-07-23 MED ORDER — PROPOFOL 10 MG/ML IV BOLUS
INTRAVENOUS | Status: DC | PRN
  Administered 2016-07-23: 150 mg via INTRAVENOUS

## 2016-07-23 MED ORDER — ONDANSETRON HCL 4 MG/2ML IJ SOLN: 4.0000 mg | Freq: Four times a day (QID) | INTRAMUSCULAR | Status: DC | PRN

## 2016-07-23 MED ORDER — ROCURONIUM BROMIDE 50 MG/5ML IV SOSY
PREFILLED_SYRINGE | INTRAVENOUS | Status: AC
  Filled 2016-07-23: qty 15

## 2016-07-23 MED ORDER — CEFAZOLIN SODIUM-DEXTROSE 2-4 GM/100ML-% IV SOLN: 2.0000 g | INTRAVENOUS | Status: DC

## 2016-07-23 MED ORDER — SODIUM CHLORIDE 0.9% FLUSH
10.0000 mL | INTRAVENOUS | Status: DC | PRN
  Administered 2016-07-23: 20 mL
  Administered 2016-07-24 – 2016-07-26 (×2): 10 mL
  Filled 2016-07-23 (×3): qty 40

## 2016-07-23 MED ORDER — ONDANSETRON HCL 4 MG/2ML IJ SOLN
INTRAMUSCULAR | Status: AC
  Filled 2016-07-23: qty 2

## 2016-07-23 MED ORDER — MIDAZOLAM HCL 2 MG/2ML IJ SOLN
INTRAMUSCULAR | Status: AC
  Filled 2016-07-23: qty 2

## 2016-07-23 MED ORDER — PROPOFOL 10 MG/ML IV BOLUS
INTRAVENOUS | Status: AC
  Filled 2016-07-23: qty 20

## 2016-07-23 MED ORDER — DIPHENHYDRAMINE HCL 12.5 MG/5ML PO ELIX: 12.5000 mg | ORAL_SOLUTION | Freq: Four times a day (QID) | ORAL | Status: DC | PRN

## 2016-07-23 MED ORDER — CEFAZOLIN SODIUM-DEXTROSE 2-4 GM/100ML-% IV SOLN
2.0000 g | Freq: Three times a day (TID) | INTRAVENOUS | Status: AC
  Administered 2016-07-23: 2 g via INTRAVENOUS
  Filled 2016-07-23: qty 100

## 2016-07-23 MED ORDER — ONDANSETRON 4 MG PO TBDP: 4.0000 mg | ORAL_TABLET | Freq: Four times a day (QID) | ORAL | Status: DC | PRN

## 2016-07-23 MED ORDER — DOCUSATE SODIUM 100 MG PO CAPS
100.0000 mg | ORAL_CAPSULE | Freq: Two times a day (BID) | ORAL | Status: DC
  Administered 2016-07-23 – 2016-07-26 (×6): 100 mg via ORAL
  Filled 2016-07-23 (×6): qty 1

## 2016-07-23 MED ORDER — IBUPROFEN 600 MG PO TABS
600.0000 mg | ORAL_TABLET | Freq: Four times a day (QID) | ORAL | Status: DC | PRN
  Administered 2016-07-24 – 2016-07-26 (×2): 600 mg via ORAL
  Filled 2016-07-23 (×2): qty 1

## 2016-07-23 MED ORDER — CHLORHEXIDINE GLUCONATE 0.12 % MT SOLN
15.0000 mL | Freq: Two times a day (BID) | OROMUCOSAL | Status: DC
  Administered 2016-07-23 – 2016-07-26 (×6): 15 mL via OROMUCOSAL
  Filled 2016-07-23 (×6): qty 15

## 2016-07-23 MED ORDER — CHLORHEXIDINE GLUCONATE CLOTH 2 % EX PADS: 6.0000 | MEDICATED_PAD | Freq: Once | CUTANEOUS | Status: DC

## 2016-07-23 MED ORDER — SUGAMMADEX SODIUM 200 MG/2ML IV SOLN
INTRAVENOUS | Status: AC
  Filled 2016-07-23: qty 2

## 2016-07-23 MED ORDER — BUPIVACAINE HCL (PF) 0.25 % IJ SOLN
INTRAMUSCULAR | Status: DC | PRN
  Administered 2016-07-23 (×2): 30 mL

## 2016-07-23 MED ORDER — SUCCINYLCHOLINE CHLORIDE 200 MG/10ML IV SOSY
PREFILLED_SYRINGE | INTRAVENOUS | Status: AC
  Filled 2016-07-23: qty 20

## 2016-07-23 MED ORDER — DIPHENOXYLATE-ATROPINE 2.5-0.025 MG PO TABS: 1.0000 | ORAL_TABLET | Freq: Four times a day (QID) | ORAL | Status: DC | PRN

## 2016-07-23 MED ORDER — LACTATED RINGERS IV SOLN
INTRAVENOUS | Status: DC
  Administered 2016-07-23 (×2): via INTRAVENOUS

## 2016-07-23 MED ORDER — SUGAMMADEX SODIUM 200 MG/2ML IV SOLN
INTRAVENOUS | Status: DC | PRN
  Administered 2016-07-23: 200 mg via INTRAVENOUS

## 2016-07-23 MED ORDER — ONDANSETRON HCL 4 MG/2ML IJ SOLN
INTRAMUSCULAR | Status: DC | PRN
Start: 2016-07-23 — End: 2016-07-23
  Administered 2016-07-23: 4 mg via INTRAVENOUS

## 2016-07-23 MED ORDER — OXYCODONE HCL 5 MG PO TABS
5.0000 mg | ORAL_TABLET | ORAL | Status: DC | PRN
  Administered 2016-07-23 – 2016-07-26 (×12): 10 mg via ORAL
  Filled 2016-07-23 (×11): qty 2

## 2016-07-23 MED ORDER — PANTOPRAZOLE SODIUM 40 MG PO TBEC
40.0000 mg | DELAYED_RELEASE_TABLET | Freq: Every day | ORAL | Status: DC
  Administered 2016-07-23 – 2016-07-26 (×4): 40 mg via ORAL
  Filled 2016-07-23 (×4): qty 1

## 2016-07-23 MED ORDER — MORPHINE SULFATE (PF) 2 MG/ML IV SOLN
1.0000 mg | INTRAVENOUS | Status: DC | PRN
  Administered 2016-07-23: 4 mg via INTRAVENOUS
  Administered 2016-07-23 – 2016-07-24 (×5): 2 mg via INTRAVENOUS
  Administered 2016-07-24: 4 mg via INTRAVENOUS
  Filled 2016-07-23 (×3): qty 1
  Filled 2016-07-23: qty 2
  Filled 2016-07-23 (×4): qty 1

## 2016-07-23 MED ORDER — ENOXAPARIN SODIUM 40 MG/0.4ML ~~LOC~~ SOLN
40.0000 mg | SUBCUTANEOUS | Status: DC
  Administered 2016-07-24 – 2016-07-26 (×3): 40 mg via SUBCUTANEOUS
  Filled 2016-07-23 (×3): qty 0.4

## 2016-07-23 MED ORDER — LORATADINE 10 MG PO TABS: 5.0000 mg | ORAL_TABLET | Freq: Every day | ORAL | Status: DC | PRN

## 2016-07-23 MED ORDER — ACETAMINOPHEN 650 MG RE SUPP: 650.0000 mg | Freq: Four times a day (QID) | RECTAL | Status: DC | PRN

## 2016-07-23 MED ORDER — FENTANYL CITRATE (PF) 100 MCG/2ML IJ SOLN
INTRAMUSCULAR | Status: DC | PRN
  Administered 2016-07-23 (×5): 50 ug via INTRAVENOUS
  Administered 2016-07-23: 150 ug via INTRAVENOUS
  Administered 2016-07-23: 50 ug via INTRAVENOUS

## 2016-07-23 MED ORDER — FENTANYL CITRATE (PF) 100 MCG/2ML IJ SOLN
INTRAMUSCULAR | Status: AC
  Administered 2016-07-23: 50 ug via INTRAVENOUS
  Filled 2016-07-23: qty 2

## 2016-07-23 MED ORDER — PHENYLEPHRINE HCL 10 MG/ML IJ SOLN
INTRAMUSCULAR | Status: DC | PRN
  Administered 2016-07-23: 120 ug via INTRAVENOUS

## 2016-07-23 MED ORDER — ATENOLOL 50 MG PO TABS
50.0000 mg | ORAL_TABLET | Freq: Every day | ORAL | Status: DC
  Administered 2016-07-24 – 2016-07-26 (×3): 50 mg via ORAL
  Filled 2016-07-23 (×3): qty 1

## 2016-07-23 MED ORDER — OXYCODONE HCL 5 MG PO TABS
ORAL_TABLET | ORAL | Status: AC
  Filled 2016-07-23: qty 2

## 2016-07-23 MED ORDER — PHENYLEPHRINE 40 MCG/ML (10ML) SYRINGE FOR IV PUSH (FOR BLOOD PRESSURE SUPPORT)
PREFILLED_SYRINGE | INTRAVENOUS | Status: AC
  Filled 2016-07-23: qty 20

## 2016-07-23 MED ORDER — KCL IN DEXTROSE-NACL 20-5-0.45 MEQ/L-%-% IV SOLN
INTRAVENOUS | Status: AC
  Filled 2016-07-23: qty 1000

## 2016-07-23 MED ORDER — ZOLPIDEM TARTRATE 5 MG PO TABS: 5.0000 mg | ORAL_TABLET | Freq: Every evening | ORAL | Status: DC | PRN

## 2016-07-23 MED ORDER — KETOROLAC TROMETHAMINE 30 MG/ML IJ SOLN
30.0000 mg | Freq: Four times a day (QID) | INTRAMUSCULAR | Status: AC
  Administered 2016-07-23 – 2016-07-24 (×4): 30 mg via INTRAVENOUS
  Filled 2016-07-23 (×4): qty 1

## 2016-07-23 MED ORDER — METHOCARBAMOL 500 MG PO TABS
500.0000 mg | ORAL_TABLET | Freq: Four times a day (QID) | ORAL | Status: DC | PRN
  Administered 2016-07-23 – 2016-07-26 (×6): 500 mg via ORAL
  Filled 2016-07-23 (×5): qty 1

## 2016-07-23 SURGICAL SUPPLY — 45 items
BINDER ABDOMINAL 12 ML 46-62 (SOFTGOODS) ×3 IMPLANT
BLADE SURG ROTATE 9660 (MISCELLANEOUS) IMPLANT
CANISTER SUCTION 2500CC (MISCELLANEOUS) ×3 IMPLANT
CHLORAPREP W/TINT 26ML (MISCELLANEOUS) IMPLANT
COVER SURGICAL LIGHT HANDLE (MISCELLANEOUS) ×3 IMPLANT
DRAIN CHANNEL 19F RND (DRAIN) ×3 IMPLANT
DRAPE LAPAROSCOPIC ABDOMINAL (DRAPES) ×3 IMPLANT
DRAPE WARM FLUID 44X44 (DRAPE) ×3 IMPLANT
DRSG COVADERM 4X10 (GAUZE/BANDAGES/DRESSINGS) ×3 IMPLANT
DRSG OPSITE POSTOP 4X10 (GAUZE/BANDAGES/DRESSINGS) IMPLANT
DRSG OPSITE POSTOP 4X8 (GAUZE/BANDAGES/DRESSINGS) IMPLANT
ELECT BLADE 6.5 EXT (BLADE) ×3 IMPLANT
ELECT CAUTERY BLADE 6.4 (BLADE) ×3 IMPLANT
ELECT REM PT RETURN 9FT ADLT (ELECTROSURGICAL) ×3
ELECTRODE REM PT RTRN 9FT ADLT (ELECTROSURGICAL) ×1 IMPLANT
EVACUATOR SILICONE 100CC (DRAIN) ×3 IMPLANT
GLOVE BIO SURGEON STRL SZ 6 (GLOVE) ×3 IMPLANT
GLOVE BIOGEL PI IND STRL 6.5 (GLOVE) ×1 IMPLANT
GLOVE BIOGEL PI INDICATOR 6.5 (GLOVE) ×2
GOWN STRL REUS W/ TWL LRG LVL3 (GOWN DISPOSABLE) ×1 IMPLANT
GOWN STRL REUS W/TWL 2XL LVL3 (GOWN DISPOSABLE) ×3 IMPLANT
GOWN STRL REUS W/TWL LRG LVL3 (GOWN DISPOSABLE) ×3
KIT BASIN OR (CUSTOM PROCEDURE TRAY) ×3 IMPLANT
KIT ROOM TURNOVER OR (KITS) ×3 IMPLANT
LIGASURE IMPACT 36 18CM CVD LR (INSTRUMENTS) IMPLANT
NS IRRIG 1000ML POUR BTL (IV SOLUTION) ×6 IMPLANT
PACK GENERAL/GYN (CUSTOM PROCEDURE TRAY) ×3 IMPLANT
PAD ARMBOARD 7.5X6 YLW CONV (MISCELLANEOUS) ×3 IMPLANT
SPECIMEN JAR LARGE (MISCELLANEOUS) IMPLANT
SPONGE LAP 18X18 X RAY DECT (DISPOSABLE) IMPLANT
STAPLER VISISTAT 35W (STAPLE) ×6 IMPLANT
SUCTION POOLE TIP (SUCTIONS) ×3 IMPLANT
SUT ETHILON 2 0 FS 18 (SUTURE) ×3 IMPLANT
SUT NOVA 1 T20/GS 25DT (SUTURE) ×18 IMPLANT
SUT PDS II 0 TP-1 LOOPED 60 (SUTURE) ×6 IMPLANT
SUT VIC AB 0 CT2 27 (SUTURE) ×3 IMPLANT
SUT VIC AB 2-0 SH 18 (SUTURE) ×9 IMPLANT
SUT VIC AB 3-0 SH 18 (SUTURE) ×3 IMPLANT
SUT VICRYL 4-0 PS2 18IN ABS (SUTURE) IMPLANT
SUT VICRYL AB 2 0 TIES (SUTURE) ×3 IMPLANT
SUT VICRYL AB 3 0 TIES (SUTURE) ×3 IMPLANT
TOWEL OR 17X24 6PK STRL BLUE (TOWEL DISPOSABLE) IMPLANT
TOWEL OR 17X26 10 PK STRL BLUE (TOWEL DISPOSABLE) IMPLANT
TRAY FOLEY CATH 16FRSI W/METER (SET/KITS/TRAYS/PACK) IMPLANT
YANKAUER SUCT BULB TIP NO VENT (SUCTIONS) IMPLANT

## 2016-07-23 NOTE — Discharge Instructions (Signed)
[facility]      [facility] °[phone] ° °ABDOMINAL SURGERY: POST OP INSTRUCTIONS ° °Always review your discharge instruction sheet given to you by the facility where your surgery was performed. ° °IF YOU HAVE DISABILITY OR FAMILY LEAVE FORMS, YOU MUST BRING THEM TO THE OFFICE FOR PROCESSING.  PLEASE DO NOT GIVE THEM TO YOUR DOCTOR. ° °1. A prescription for pain medication may be given to you upon discharge.  Take your pain medication as prescribed, if needed.  If narcotic pain medicine is not needed, then you may take acetaminophen (Tylenol) or ibuprofen (Advil) as needed. °2. Take your usually prescribed medications unless otherwise directed. °3. If you need a refill on your pain medication, please contact your pharmacy. They will contact our office to request authorization.  Prescriptions will not be filled after 5pm or on week-ends. °4. You should follow a light diet the first few days after arrival home, such as soup and crackers, pudding, etc.unless your doctor has advised otherwise. A high-fiber, low fat diet can be resumed as tolerated.   Be sure to include lots of fluids daily. Most patients will experience some swelling and bruising on the chest and neck area.  Ice packs will help.  Swelling and bruising can take several days to resolve °5. Most patients will experience some swelling and bruising in the area of the incision. Ice pack will help. Swelling and bruising can take several days to resolve..  °6. It is common to experience some constipation if taking pain medication after surgery.  Increasing fluid intake and taking a stool softener will usually help or prevent this problem from occurring.  A mild laxative (Milk of Magnesia or Miralax) should be taken according to package directions if there are no bowel movements after 48 hours. °7.  You may have steri-strips (small skin tapes) in place directly over the incision.  These strips should be left on the skin for 10-14 days.  If your  surgeon used skin glue on the incision, you may shower in 48 hours.  The glue will flake off over the next 2-3 weeks.  Any sutures or staples will be removed at the office during your follow-up visit. You may find that a light gauze bandage over your incision may keep your staples from being rubbed or pulled. You may shower and replace the bandage daily. °8. ACTIVITIES:  You may resume regular (light) daily activities beginning the next day--such as daily self-care, walking, climbing stairs--gradually increasing activities as tolerated.  You may have sexual intercourse when it is comfortable.  Refrain from any heavy lifting or straining until approved by your doctor. °a. You may drive when you no longer are taking prescription pain medication, you can comfortably wear a seatbelt, and you can safely maneuver your car and apply brakes °b. Return to Work: __________8 weeks if applicable_________________________ °9. You should see your doctor in the office for a follow-up appointment approximately two weeks after your surgery.  Make sure that you call for this appointment within a day or two after you arrive home to insure a convenient appointment time. °OTHER INSTRUCTIONS:  °_____________________________________________________________ °_____________________________________________________________ ° °WHEN TO CALL YOUR DOCTOR: °1. Fever over 101.0 °2. Inability to urinate °3. Nausea and/or vomiting °4. Extreme swelling or bruising °5. Continued bleeding from incision. °6. Increased pain, redness, or drainage from the incision. °7. Difficulty swallowing or breathing °8. Muscle cramping or spasms. °9. Numbness or tingling in hands or feet or around lips. ° °The clinic staff is   available to answer your questions during regular business hours.  Please don’t hesitate to call and ask to speak to one of the nurses if you have concerns. ° °For further questions, please visit [email or web address] ° ° ° °

## 2016-07-23 NOTE — Progress Notes (Signed)
After 3 attempts to start an  IV. Dr Gifford Shave called and informed states ok to access [facility] cath. Iv team called and informed and order placed to access.

## 2016-07-23 NOTE — Anesthesia Postprocedure Evaluation (Signed)
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 8 CM WITH ADVANCEMENT FLAP CLOSURE (N/A)  Patient location during evaluation: PACU Anesthesia Type: General Level of consciousness: awake and alert Pain management: pain level controlled Vital Signs Assessment: post-procedure vital signs reviewed and stable Respiratory status: spontaneous breathing, nonlabored ventilation, respiratory function stable and patient connected to nasal cannula oxygen Cardiovascular status: blood pressure returned to baseline and stable Postop Assessment: no signs of nausea or vomiting Anesthetic complications: no       Last Vitals:  Vitals:   07/23/16 1640 07/23/16 1701  BP: 130/72 (!) 149/71  Pulse: 69 72  Resp: 18   Temp: 36.5 C 36.9 C    Last Pain:  Vitals:   07/23/16 1713  TempSrc:   PainSc: [place]

## 2016-07-23 NOTE — Op Note (Signed)
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  3 x3 cm fascial defect, 9 x 9 cm skin defect, resection of 3 cm malignant peritoneal mass  SURGEON:  Surgeon(s): Stark Klein, MD  ASSISTANT: Judyann Munson, RNFA followed by Sharyn Dross, RNFA  ANESTHESIA:   local and general  DRAINS: (46 Fr) Blake drain(s) in the Subcutaneous tissue   LOCAL MEDICATIONS USED:  BUPIVICAINE   SPECIMEN:  Source of Specimen:  abdominal wall mass and peritoneal mass  DISPOSITION OF SPECIMEN:  PATHOLOGY  COUNTS:  YES  DICTATION: .Dragon Dictation  PLAN OF CARE: Admit to inpatient   PATIENT DISPOSITION:  PACU - hemodynamically stable.  FINDINGS:  Necrotic skin mass in epigastric location.  Mass invaded to deep layer of rectus fascia  EBL: 50 mL  PROCEDURE:  Patient was identified in the holding area and taken to the operating room where he was placed supine on operating room table. General anesthesia was induced. The abdomen was prepped and draped in sterile fashion. A timeout was performed according to the surgical safety checklist. When all was correct, we continued.  A circular incision was made approximately a centimeter from the abdominal wall colon cancer metastasis in the epigastric location. The cautery was used to go through the subcutaneous tissues. A tonsil was used to assist with dissection. The muscle fibers that were adherent and the fascia that was adherent to the mass was taken en bloc. The very tip of the xiphoid also was taken.  I did enter the peritoneal cavity. There was a 3 cm intra-abdominal metastasis directly under this fascial defect. This was taken out with the cautery. The peritoneum was closed with a 0 Vicryl.  The posterior fascia was mobilized and was closed transversely with interrupted #1 Novafil  pops. The center part was approximated to the xiphoid process. The anterior fascia was mobilized as well. The center portion of this could not be fully reapproximated, but the lateral portions were. These were also closed with #1 Novafil pops sutures.  The skin was mobilized off of the fascia and reapproximated with a layer of interrupted 2-0 Vicryl sutures. A 19 [place] Blake drain was placed in this space to decrease risk of seroma placement. The dogears laterally were taken off with a #10 blade. Hemostasis was achieved with cautery. The skin was closed using skin staples. The drain was secured with a 2-0 nylon drain stitch.   he wounds were then cleaned, dried, and dressed with a Covaderm. The patient was placed into an abdominal binder. He was allowed to emerge from anesthesia and taken to the PACU in stable condition. Needle, sponge, and instrument counts were correct 2.

## 2016-07-23 NOTE — Anesthesia Procedure Notes (Addendum)
Procedure Name: Intubation Date/Time: 07/23/2016 1:45 PM Performed by: Salli Quarry Paidyn Mcferran Pre-anesthesia Checklist: Patient identified, Emergency Drugs available, Suction available and Patient being monitored Patient Re-evaluated:Patient Re-evaluated prior to inductionOxygen Delivery Method: Circle System Utilized Preoxygenation: Pre-oxygenation with 100% oxygen Intubation Type: IV induction Ventilation: Mask ventilation without difficulty Laryngoscope Size: Mac and 4 Grade View: Grade II Tube type: Oral Tube size: 7.5 mm Number of attempts: 2 Airway Equipment and Method: Stylet Placement Confirmation: ETT inserted through vocal cords under direct vision,  positive ETCO2 and breath sounds checked- equal and bilateral Secured at: 23 cm Tube secured with: Tape Dental Injury: Teeth and Oropharynx as per pre-operative assessment  Comments: DLx1 with Miller 2 by SRNA, grade 2 view, atraumatic intubation, +Cuff leak.  Unable to advance ETT to solve leak.  ETT removed. DLx2 with MAC 4 by Turk,MD, grade 2 view, ETT passed easily, BBS=, +ETCO2. ETT secured

## 2016-07-23 NOTE — OR Nursing (Signed)
Edited site for incision and drain.  Changed from chest to abdomen.

## 2016-07-23 NOTE — Transfer of Care (Signed)
Immediate Anesthesia Transfer of Care Note  Patient: Bryan Fields  Procedure(s) Performed: Procedure(s): EXCISION OF MALIGNANT TUMOR FROM  ABDOMINAL WALL 8 CM WITH ADVANCEMENT FLAP CLOSURE (N/A)  Patient Location: PACU  Anesthesia Type:General  Level of Consciousness: awake, alert , oriented and patient cooperative  Airway & Oxygen Therapy: Patient Spontanous Breathing and Patient connected to nasal cannula oxygen  Post-op Assessment: Report given to RN, Post -op Vital signs reviewed and stable and Patient moving all extremities X 4  Post vital signs: Reviewed and stable  Last Vitals:  Vitals:   07/23/16 1145 07/23/16 1522  BP: 123/78 (!) 151/81  Pulse: 66 72  Resp: 20 13  Temp: 36.9 C 36.6 C    Last Pain:  Vitals:   07/23/16 1522  TempSrc:   PainSc: (P) 0-No pain         Complications: No apparent anesthesia complications

## 2016-07-23 NOTE — Interval H&P Note (Signed)
History and Physical Interval Note:  07/23/2016 1:19 PM  Bryan Fields  has presented today for surgery, with the diagnosis of cancer metastasis to skin  The various methods of treatment have been discussed with the patient and family. After consideration of risks, benefits and other options for treatment, the patient has consented to  Procedure(s): EXCISION OF MALIGNANT TUMOR FROM  ABDOMINAL WALL 8 CM WITH ADVANCEMENT FLAP CLOSURE (N/A) as a surgical intervention .  The patient's history has been reviewed, patient examined, no change in status, stable for surgery.  I have reviewed the patient's chart and labs.  Questions were answered to the patient's satisfaction.     Pardeep Pautz

## 2016-07-23 NOTE — OR Nursing (Signed)
Added out times for staff Deon Pilling and [employer]

## 2016-07-23 NOTE — Anesthesia Preprocedure Evaluation (Addendum)
Anesthesia Evaluation  Patient identified by MRN, date of birth, ID band Patient awake    Reviewed: Allergy & Precautions, NPO status , Patient's Chart, lab work & pertinent test results, reviewed documented beta blocker date and time   Airway Mallampati: II  TM Distance: >3 FB Neck ROM: Full    Dental  (+) Teeth Intact, Dental Advisory Given, Loose,    Pulmonary former smoker,    Pulmonary exam normal breath sounds clear to auscultation       Cardiovascular hypertension, Pt. on medications and Pt. 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  Reproductive/Obstetrics                            Anesthesia Physical Anesthesia Plan  ASA: III  Anesthesia Plan: General   Post-op Pain Management:    Induction: Intravenous  Airway Management Planned: Oral ETT  Additional Equipment:   Intra-op Plan:   Post-operative Plan: Extubation in OR  Informed Consent: I have reviewed the patients History and Physical, chart, labs and discussed the procedure including the risks, benefits and alternatives for the proposed anesthesia with the patient or authorized representative who has indicated his/her understanding and acceptance.   Dental advisory given  Plan Discussed with: CRNA  Anesthesia Plan Comments: (Risks/benefits of general anesthesia discussed with patient  including risk of damage to teeth, lips, gum, and tongue, nausea/vomiting, allergic reactions to medications, and the possibility of heart attack, stroke and death.  All patient questions answered.  Patient wishes to proceed.  2nd PIV after induction.)       Anesthesia Quick Evaluation

## 2016-07-23 NOTE — H&P (Signed)
Bryan Fields 07/10/2016 12:07 PM Location: [facility] Patient #: [id number] DOB: 11-19-1960 Married / Language: Cleophus Molt / Race: White Male   History of Present Illness Stark Klein MD; 07/10/2016 2:09 PM) The patient is a 56 year old male who presents for a follow-up for Colorectal cancer. Pt is here for consultation at the request of Dr. Benay Spice for evaluation of his abdominal wall nodule again. The patient had right colectomy by Dr. 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 The patient developed metastatic disease in 2015 in the liver. He had left hepatectomy in August 2015. He developed a metastasis in his abdominal wall at the subxiphoid position relatively quickly in addition to several other sites in the abdomen, and he started on chemo. He has been continuously receiving chemotherapy since that time. He received radiation to the abdominal wall lesion. I saw him 2 years ago to determine whether or not to resect the abdominal wall nodule. We reviewed at cancer conference and discussed that we would continue chemotherapy for his other lesions. He continues to tolerate chemotherapy very well. Unfortunately, his abdominal wall lesion has not responded to treatment. It has become quite large. It is now fungating and having several sites that breakdown in bleed. It is interfering with his quality of life.  CT 05/25/16 CT ABDOMEN PELVIS FINDINGS  Hepatobiliary: Status post left hepatectomy. Stable appearance of mild biliary distention in the anterior right liver. Gallbladder is collapsed around calcified stones. Previously described lesion along the resection margin has progressed in the interval.  Pancreas: No focal mass lesion. No dilatation of the main duct. No intraparenchymal cyst. No peripancreatic edema.  Spleen: No splenomegaly. No focal mass lesion.  Adrenals/Urinary Tract: No adrenal nodule or mass. Kidneys  are unremarkable No evidence for hydroureter. The urinary bladder appears normal for the degree of distention.  Stomach/Bowel: Stomach is nondistended. No gastric wall thickening. No evidence of outlet obstruction. Duodenum is normally positioned as is the ligament of Treitz. No small bowel wall thickening. No small bowel dilatation. Patient is status post right hemicolectomy. No gross colonic mass. No colonic wall thickening. No substantial diverticular change.  Vascular/Lymphatic: There is abdominal aortic atherosclerosis without aneurysm. 2.8 x 4.3 cm lymph node in the hepatoduodenal ligament is substantially progressed in the interval from about 1.2 x 2.4 cm when I remeasure it on the prior study. 1.5 cm left omental nodule (image 51 series 2 was 9 mm previously. No retroperitoneal lymphadenopathy. No pelvic sidewall lymphadenopathy.  Reproductive: The prostate gland and seminal vesicles have normal imaging features.  Other: No intraperitoneal free fluid.  Musculoskeletal: Bone windows reveal no worrisome lytic or sclerotic osseous lesions.  IMPRESSION: 1. Interval progression of metastatic disease in the abdomen. 2. Slight progression of mediastinal lymph nodes which do not meet CT criteria for pathologic enlargement. 3. Coronary artery and thoracoabdominal aortic atherosclerosis. 4. Cholelithiasis.     Review of Systems Stark Klein MD; 07/10/2016 2:07 PM) All other systems negative   Physical Exam Stark Klein MD; 07/10/2016 2:08 PM) General Mental Status-Alert. General Appearance-Consistent with stated age. Hydration-Well hydrated. Voice-Normal.  Head and Neck Head-normocephalic, atraumatic with no lesions or palpable masses.  Eye Sclera/Conjunctiva - Bilateral-No scleral icterus.  Chest and Lung Exam Chest and lung exam reveals -quiet, even and easy respiratory effort with no use of accessory muscles. Inspection Chest Wall - Normal. Back -  normal.  Breast - Did not examine.  Cardiovascular Cardiovascular examination reveals -normal pedal pulses bilaterally.  Note: regular rate and rhythm  Abdomen Inspection-Inspection Normal. Palpation/Percussion Palpation and Percussion of the abdomen reveal - Soft, Non Tender, No Rebound tenderness, No Rigidity (guarding) and No hepatosplenomegaly. Note: 8 cm exophytic, fungating mass in the epigastric location. Purplish/black in color wtih several raw areas.   Peripheral Vascular Upper Extremity Inspection - Bilateral - Normal - No Clubbing, No Cyanosis, No Edema, Pulses Intact. Lower Extremity Palpation - Edema - Bilateral - No edema.  Neurologic Neurologic evaluation reveals -alert and oriented x 3 with no impairment of recent or remote memory. Mental Status-Normal.  Musculoskeletal Global Assessment -Note: no gross deformities.  Normal Exam - Left-Upper Extremity Strength Normal and Lower Extremity Strength Normal. Normal Exam - Right-Upper Extremity Strength Normal and Lower Extremity Strength Normal.  Lymphatic Head & Neck  General Head & Neck Lymphatics: Bilateral - Description - Normal. Axillary  General Axillary Region: Bilateral - Description - Normal. Tenderness - Non Tender.   Addendum Note(Astha Probasco MD; 07/10/2016 2:11 PM) Vitals 5'11" 250.5 lbs 131/76 56 18 97.9 100%    Assessment & Plan Stark Klein MD; 07/10/2016 2:09 PM) METASTASIS TO SKIN (C79.2) Impression: This abdominal wall metastasis has developed a fungating appearance. It is very painful and bleeds. This would be a palliative resection. I reviewed that some of the muscle will have to come with it. I discussed that after resection, the challenging part would be to get the area closed. I will have him see a plastic surgeon in order to assist with closure. I told him that we would try to do advancement flap closures in order to close the abdomen. He had a prior chevron  incision below that and radiation. Both of these factors affect the blood flow to this area. Although not ideal, he may require some sort of biologic mesh placement and wound VAC. This may take several procedures to get closed. I discussed that it also may fall apart and that he may get a hernia there. I reviewed the risk of infection. He would like to proceed as soon as possible. Current Plans Schedule for Surgery Pt Education - [employer] Free Text Education/Instructions: discussed with patient and provided information. COLON CARCINOMA METASTATIC TO MULTIPLE SITES (C18.9) Impression: He does have other underlying metastases in his abdomen. There is one directly underneath the abdominal wall mass. This is posterior fascia is intact, I advised him that I would not attempt to resect the one underneath because he will have a much greater chance of avoiding hernia if that is left intact. However, if we do end up having to resect the posterior fascia, I would attempt to remove the metal right below the abdominal wall. He would still be recommended to receive chemotherapy for his additional lesions.    Signed by Stark Klein, MD (07/10/2016 2:10 PM)

## 2016-07-24 ENCOUNTER — Encounter (HOSPITAL_COMMUNITY): Payer: Self-pay | Admitting: General Surgery

## 2016-07-24 LAB — CBC
HCT: 39.2 % (ref 39.0–52.0)
Hemoglobin: 13.4 g/dL (ref 13.0–17.0)
MCH: 33.3 pg (ref 26.0–34.0)
MCHC: 34.2 g/dL (ref 30.0–36.0)
MCV: 97.5 fL (ref 78.0–100.0)
PLATELETS: 270 10*3/uL (ref 150–400)
RBC: 4.02 MIL/uL — AB (ref 4.22–5.81)
RDW: 14.1 % (ref 11.5–15.5)
WBC: 8 10*3/uL (ref 4.0–10.5)

## 2016-07-24 LAB — BASIC METABOLIC PANEL
Anion gap: 7 (ref 5–15)
BUN: 15 mg/dL (ref 6–20)
CALCIUM: 8.5 mg/dL — AB (ref 8.9–10.3)
CO2: 26 mmol/L (ref 22–32)
CREATININE: 1.31 mg/dL — AB (ref 0.61–1.24)
Chloride: 101 mmol/L (ref 101–111)
GFR calc Af Amer: >60 mL/min (ref >60)
GFR, EST NON AFRICAN AMERICAN: 59 mL/min — AB (ref >60)
Glucose, Bld: 109 mg/dL — ABNORMAL HIGH (ref 65–99)
POTASSIUM: 4.4 mmol/L (ref 3.5–5.1)
SODIUM: 134 mmol/L — AB (ref 135–145)

## 2016-07-24 NOTE — Progress Notes (Signed)
Patient request f/u regarding Simvastatin dosing, as per home regimen. No scheduled dosing noted at this time.

## 2016-07-24 NOTE — Progress Notes (Addendum)
1 Day Post-Op  Subjective: Still fairly sore.    Objective: Vital signs in last 24 hours: Temp:  [97.7 F (36.5 C)-99.1 F (37.3 C)] 98.7 F (37.1 C) (02/16 0535) Pulse Rate:  [66-84] 83 (02/16 0535) Resp:  [12-20] 16 (02/16 0535) BP: (108-151)/(58-81) 110/58 (02/16 0535) SpO2:  [93 %-100 %] 94 % (02/16 0535) Weight:  [112.9 kg (249 lb)] 112.9 kg (249 lb) (02/15 1201) Last BM Date: 07/22/16  Intake/Output from previous day: 02/15 0701 - 02/16 0700 In: 1090 [P.O.:820; I.V.:270] Out: 765 [Urine:575; Drains:90; Blood:100] Intake/Output this shift: No intake/output data recorded.  General appearance: alert, cooperative and mild distress Resp: breathing comfortably GI: soft, non distended, approp tender.    Lab Results:   Recent Labs  07/23/16 1802 07/24/16 0449  WBC 11.0* 8.0  HGB 14.3 13.4  HCT 41.2 39.2  PLT 204 270   BMET  Recent Labs  07/23/16 1802 07/24/16 0449  NA  --  134*  K  --  4.4  CL  --  101  CO2  --  26  GLUCOSE  --  109*  BUN  --  15  CREATININE 1.21 1.31*  CALCIUM  --  8.5*   PT/INR No results for input(s): LABPROT, INR in the last 72 hours. ABG No results for input(s): PHART, HCO3 in the last 72 hours.  Invalid input(s): PCO2, PO2  Studies/Results: No results found.  Anti-infectives: Anti-infectives    Start     Dose/Rate Route Frequency Ordered Stop   07/23/16 2200  ceFAZolin (ANCEF) IVPB 2g/100 mL premix     2 g 200 mL/hr over 30 Minutes Intravenous Every 8 hours 07/23/16 1702 07/23/16 2257   07/23/16 1045  ceFAZolin (ANCEF) IVPB 2g/100 mL premix  Status:  Discontinued     2 g 200 mL/hr over 30 Minutes Intravenous To Surgery 07/23/16 1034 07/23/16 1656      Assessment/Plan: s/p Procedure(s): EXCISION OF MALIGNANT TUMOR FROM  ABDOMINAL WALL 8 CM WITH ADVANCEMENT FLAP CLOSURE (N/A) Pt continuing to need IV medication.  Plan mobilization and work on pain control. Plan d/c in AM if better controlled.   Mild acute kidney  injury.  Leave on IV fluids and recheck creatinine in AM.  Would plan on leaving drain.    LOS: 1 day    [facility] 07/24/2016

## 2016-07-24 NOTE — Progress Notes (Signed)
Patient and wife both educated on JP drain care.

## 2016-07-25 LAB — BASIC METABOLIC PANEL
Anion gap: 7 (ref 5–15)
BUN: 11 mg/dL (ref 6–20)
CHLORIDE: 100 mmol/L — AB (ref 101–111)
CO2: 27 mmol/L (ref 22–32)
Calcium: 8.8 mg/dL — ABNORMAL LOW (ref 8.9–10.3)
Creatinine, Ser: 1.17 mg/dL (ref 0.61–1.24)
GFR calc Af Amer: >60 mL/min (ref >60)
GFR calc non Af Amer: >60 mL/min (ref >60)
Glucose, Bld: 119 mg/dL — ABNORMAL HIGH (ref 65–99)
POTASSIUM: 3.9 mmol/L (ref 3.5–5.1)
SODIUM: 134 mmol/L — AB (ref 135–145)

## 2016-07-25 LAB — CBC
HEMATOCRIT: 38.8 % — AB (ref 39.0–52.0)
HEMOGLOBIN: 13.1 g/dL (ref 13.0–17.0)
MCH: 32.8 pg (ref 26.0–34.0)
MCHC: 33.8 g/dL (ref 30.0–36.0)
MCV: 97.2 fL (ref 78.0–100.0)
Platelets: 207 10*3/uL (ref 150–400)
RBC: 3.99 MIL/uL — AB (ref 4.22–5.81)
RDW: 14.2 % (ref 11.5–15.5)
WBC: 8 10*3/uL (ref 4.0–10.5)

## 2016-07-25 MED ORDER — POLYETHYLENE GLYCOL 3350 17 G PO PACK
17.0000 g | PACK | Freq: Every day | ORAL | Status: DC
  Administered 2016-07-25 – 2016-07-26 (×2): 17 g via ORAL
  Filled 2016-07-25 (×2): qty 1

## 2016-07-25 MED ORDER — SIMVASTATIN 40 MG PO TABS
40.0000 mg | ORAL_TABLET | Freq: Every day | ORAL | Status: DC
  Administered 2016-07-25: 40 mg via ORAL
  Filled 2016-07-25: qty 1

## 2016-07-25 NOTE — Progress Notes (Signed)
Patient ID: Bryan Fields, male   DOB: 06-14-1960, 56 y.o.   MRN: [id number]  [facility] Progress Note  2 Days Post-Op  Subjective: Continues to have abdominal pain requiring IV pain medication. Ok at rest but pain increases with motion. He did ambulate in the hall last night. No BM yet but feels like he may have one soon. Tolerating regular diet.  Objective: Vital signs in last 24 hours: Temp:  [98.8 F (37.1 C)-99.4 F (37.4 C)] 98.9 F (37.2 C) (02/17 0433) Pulse Rate:  [68-78] 78 (02/17 0433) Resp:  [18-19] 19 (02/17 0433) BP: (114-124)/(63-73) 122/73 (02/17 0433) SpO2:  [96 %] 96 % (02/17 0433) Last BM Date: 07/22/16  Intake/Output from previous day: 02/16 0701 - 02/17 0700 In: 2000 [P.O.:340; I.V.:1660] Out: 1190 [Urine:1150; Drains:40] Intake/Output this shift: Total I/O In: 340 [P.O.:340] Out: 0   PE: Gen:  Alert, NAD, pleasant Pulm:  CTAB, effort normal Abd: Soft, NT/ND, +BS, no HSM, upper abdominal transverse incision C/D/I with staples intact, drain with minimal sanguinous drainage  Lab Results:   Recent Labs  07/24/16 0449 07/25/16 0438  WBC 8.0 8.0  HGB 13.4 13.1  HCT 39.2 38.8*  PLT 270 207   BMET  Recent Labs  07/24/16 0449 07/25/16 0438  NA 134* 134*  K 4.4 3.9  CL 101 100*  CO2 26 27  GLUCOSE 109* 119*  BUN 15 11  CREATININE 1.31* 1.17  CALCIUM 8.5* 8.8*   PT/INR No results for input(s): LABPROT, INR in the last 72 hours. CMP     Component Value Date/Time   NA 134 (L) 07/25/2016 0438   NA 135 (L) 07/07/2016 1111   K 3.9 07/25/2016 0438   K 4.5 07/07/2016 1111   CL 100 (L) 07/25/2016 0438   CL 103 04/11/2012 1257   CO2 27 07/25/2016 0438   CO2 22 07/07/2016 1111   GLUCOSE 119 (H) 07/25/2016 0438   GLUCOSE 140 07/07/2016 1111   GLUCOSE 89 04/11/2012 1257   BUN 11 07/25/2016 0438   BUN 24.2 07/07/2016 1111   CREATININE 1.17 07/25/2016 0438   CREATININE 1.2 07/07/2016 1111   CALCIUM 8.8 (L) 07/25/2016 0438    CALCIUM 9.8 07/07/2016 1111   PROT 8.2 07/07/2016 1111   ALBUMIN 3.8 07/07/2016 1111   AST 32 07/07/2016 1111   ALT 47 07/07/2016 1111   ALKPHOS 73 07/07/2016 1111   BILITOT 0.53 07/07/2016 1111   GFRNONAA >60 07/25/2016 0438   GFRAA >60 07/25/2016 0438   Lipase     Component Value Date/Time   LIPASE 16.0 04/01/2010 0922       Studies/Results: No results found.  Anti-infectives: Anti-infectives    Start     Dose/Rate Route Frequency Ordered Stop   07/23/16 2200  ceFAZolin (ANCEF) IVPB 2g/100 mL premix     2 g 200 mL/hr over 30 Minutes Intravenous Every 8 hours 07/23/16 1702 07/23/16 2257   07/23/16 1045  ceFAZolin (ANCEF) IVPB 2g/100 mL premix  Status:  Discontinued     2 g 200 mL/hr over 30 Minutes Intravenous To Surgery 07/23/16 1034 07/23/16 1656       Assessment/Plan s/p Procedure(s): EXCISION OF MALIGNANT TUMOR FROM  ABDOMINAL WALL 8 CM WITH ADVANCEMENT FLAP CLOSURE (N/A) 2/15 Dr. Barry Dienes - POD 2 - drain 40cc/24 hr sanguinous fluid - WBC 8, afebrile  AKI - resolved, Cr 1.17  ID - ancef perioperative FEN - regular diet VTE - lovenox  Plan - AKI resolved, decrease IVF. Continue  working on pain control. Encourage more ambulation. Schedule colace and miralax. Home once pain is controlled on PO meds.   LOS: 2 days    Bryan Fields , [facility] 07/25/2016, 10:20 AM Pager: [phone] Consults: [phone] Mon-Fri 7:00 am-4:30 pm Sat-Sun 7:00 am-11:30 am

## 2016-07-26 MED ORDER — HEPARIN SOD (PORK) LOCK FLUSH 100 UNIT/ML IV SOLN
500.0000 [IU] | INTRAVENOUS | Status: AC | PRN
  Administered 2016-07-26: 500 [IU]

## 2016-07-26 NOTE — Progress Notes (Signed)
Pt for discharge today.  JP drain care reviewed and chart to record drainage given and explained.  DC instructions given and explained.  Wife and pt understand follow up care with Dr. Barry Dienes.

## 2016-07-26 NOTE — Discharge Summary (Signed)
Patient ID: Bryan Fields [id number] y.o. 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 Pt is here for consultation at the request of Dr. Benay Spice for evaluation of his abdominal wall nodule again. The patient had right colectomy by Dr. 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 The patient developed metastatic disease in 2015 in the liver. He had left hepatectomy in August 2015. He developed a metastasis in his abdominal wall at the subxiphoid position relatively quickly in addition to several other sites in the abdomen, and he started on chemo. He has been continuously receiving chemotherapy since that time. He received radiation to the abdominal wall lesion. I saw him 2 years ago to determine whether or not to resect the abdominal wall nodule. We reviewed at cancer conference and discussed that we would continue chemotherapy for his other lesions. He continues to tolerate chemotherapy very well. Unfortunately, his abdominal wall lesion has not responded to treatment. It has become quite large. It is now fungating and having several sites that breakdown in bleed. It is interfering with his quality of life.  Hospital Course: On the day of  admission the patient was taken to the operating room by Dr. Barry Dienes.  He underwent excision of 8 cm abdominal wall malignant mass with skin soft tissue fascia and muscle there was also a 3 cm malignant peritoneal mass resected.  The posterior fascia was closed.  The center portion of the wound was approximated.  The anterior fascia was partially approximated.  Drains were placed      The patient remained hospitalized due to ileus and pain for a couple of days.       He improved and became ambulatory.  Was passing flatus and tolerating regular diet at the time of discharge.  On the day of discharge he was asking to go home.  Examination of the day of discharge revealed his abdomen was soft and nontender.  The wound was clean.  The drain had serosanguineous drainage and was left in place.     He has Percocet at home and does not need a prescription he was told to continue all his usual medicines      He was instructed in drain care.  He will  follow-up with Dr. Barry Dienes in 7-10 days.  Consults: None  Significant Diagnostic Studies: Surgical pathology, pending  Treatments: surgery: Resection of abdominal wall mass, resection of peritoneal mass.  Disposition: Home  Patient Instructions:  Allergies as of 07/26/2016      Reactions   Other    Chemo therapy drug: Oxali Caused itching and reddness      Medication List    TAKE these medications   atenolol 50 MG tablet Commonly known as:  TENORMIN Take 1 tablet (50 mg total) by mouth daily.   capecitabine 500 MG tablet Commonly known as:  XELODA Take 4 tablets (2,000 mg total) by mouth 2 (two) times daily after a meal. For 14 days then 7 days off. Start 07/13/2016   chlorhexidine 0.12 % solution Commonly known as:  PERIDEX Use as directed 15 mLs in the mouth or throat 2 (two) times daily.   [employer] SINUS & COLD-D PO Take 2 tablets by mouth 2 (two) times daily as needed (cold and sinus). Reported on 12/02/2015   dexamethasone 4 MG tablet Commonly  known as:  DECADRON Take 2.5 tablets (10 mg total) by mouth 2 (two) times daily. Begin the day before chemo. Take at 6AM morning of chemo.   diphenoxylate-atropine 2.5-0.025 MG tablet Commonly known as:  LOMOTIL Take 1-2 tablets by mouth 4 (four) times daily as needed for diarrhea or loose stools.   ibuprofen 200 MG tablet Commonly known as:  ADVIL,MOTRIN Take 400 mg by mouth every 6 (six) hours as needed for mild pain.   lidocaine-prilocaine cream Commonly known as:  EMLA Apply to port site one hour prior to use. Do not rub in. Cover with plastic.   loratadine 5 MG chewable tablet Commonly known as:  CLARITIN Chew 5 mg by mouth daily as needed for allergies. Reported on 05/23/2015   naproxen sodium 220 MG tablet Commonly known as:  ANAPROX Take 220 mg by mouth 2 (two) times daily as needed (pain).   OMEGA 3 PO Take 1,400 mg by mouth 2 (two) times daily.   omeprazole 40 MG capsule Commonly known as:  PRILOSEC Take 1 capsule (40 mg total) by mouth every other day.   oxyCODONE-acetaminophen 5-325 MG tablet Commonly known as:  PERCOCET/ROXICET Take 1 tablet by mouth every 6 (six) hours as needed for severe pain.   sildenafil 100 MG tablet Commonly known as:  VIAGRA Take 100 mg by mouth daily as needed for erectile dysfunction. Reported on 06/17/2015   simvastatin 40 MG tablet Commonly known as:  ZOCOR Take 1 tablet (40 mg total) by mouth at bedtime.   Vitamin B-12 2500 MCG Subl Place 1 tablet under the tongue daily.       Activity: No lifting.  No driving.  Ambulate as much as possible.  Okay to shower Diet: regular diet Wound Care: as directed  Follow-up:  With Dr. Barry Dienes in 5-10 days.  Signed: Edsel Petrin. Dalbert Batman, M.D., FACS General and minimally invasive surgery Breast and Colorectal Surgery  07/26/2016, 9:41 AM

## 2016-07-28 ENCOUNTER — Ambulatory Visit: Payer: 59

## 2016-07-28 ENCOUNTER — Other Ambulatory Visit: Payer: 59

## 2016-07-30 ENCOUNTER — Telehealth: Payer: Self-pay | Admitting: Oncology

## 2016-07-30 ENCOUNTER — Ambulatory Visit: Payer: 59 | Admitting: Oncology

## 2016-07-30 NOTE — Telephone Encounter (Signed)
Confirmed 3/7 appt with pt per LOS

## 2016-08-03 ENCOUNTER — Ambulatory Visit: Payer: 59

## 2016-08-03 ENCOUNTER — Other Ambulatory Visit: Payer: 59

## 2016-08-03 ENCOUNTER — Ambulatory Visit: Payer: 59 | Admitting: Oncology

## 2016-08-07 ENCOUNTER — Telehealth: Payer: Self-pay | Admitting: *Deleted

## 2016-08-07 NOTE — Telephone Encounter (Signed)
Call placed back to patient's wife and notified her per order of Dr. Benay Spice that pt may keep his appt on 08/12/16 or come after his appt at [facility].  Patient's wife states that she would like to cancel appt on 08/12/16 and reschedule after pt sees Dr. Sigurd Sos.  Pt.'s wife to call Dr. Johnnette Barrios office today to schedule appt.  Patient's wife appreciative of call back.

## 2016-08-07 NOTE — Telephone Encounter (Signed)
"  I'm scheduled to see Dr. Benay Spice next week on 08-12-2016.  Does he want to see me before I've been seen at [facility] or after?   [facility] was not scheduled yet due to surgery.  Please call me at [phone] to let me know."

## 2016-08-10 ENCOUNTER — Other Ambulatory Visit (HOSPITAL_COMMUNITY): Payer: Self-pay | Admitting: General Surgery

## 2016-08-10 DIAGNOSIS — M79606 Pain in leg, unspecified: Secondary | ICD-10-CM

## 2016-08-10 DIAGNOSIS — M7989 Other specified soft tissue disorders: Principal | ICD-10-CM

## 2016-08-12 ENCOUNTER — Ambulatory Visit (HOSPITAL_COMMUNITY): Admission: RE | Admit: 2016-08-12 | Payer: 59 | Source: Ambulatory Visit

## 2016-08-12 ENCOUNTER — Ambulatory Visit: Payer: 59 | Admitting: Oncology

## 2016-08-14 ENCOUNTER — Telehealth: Payer: Self-pay | Admitting: *Deleted

## 2016-08-14 NOTE — Telephone Encounter (Signed)
Call received from patient's wife to inform Dr. Benay Spice that pt has an appt with Dr. Sigurd Sos on Wednesday, 08/19/16 at 1:00PM and she would like an appt with Dr. Benay Spice after that appt.  Dr. Benay Spice notified and appt time given for 08/25/16 at 10:00AM.  Message sent to scheduling and call placed back to patient's wife to inform her of time.  Patient's wife appreciative of call.

## 2016-08-16 ENCOUNTER — Other Ambulatory Visit: Payer: Self-pay | Admitting: Oncology

## 2016-08-17 ENCOUNTER — Ambulatory Visit (HOSPITAL_COMMUNITY)
Admission: RE | Admit: 2016-08-17 | Discharge: 2016-08-17 | Disposition: A | Discharge status: Home / Self Care | Payer: 59 | Source: Ambulatory Visit | Attending: General Surgery | Admitting: General Surgery

## 2016-08-17 DIAGNOSIS — M7989 Other specified soft tissue disorders: Secondary | ICD-10-CM

## 2016-08-17 DIAGNOSIS — R600 Localized edema: Secondary | ICD-10-CM | POA: Diagnosis present

## 2016-08-17 DIAGNOSIS — M79606 Pain in leg, unspecified: Secondary | ICD-10-CM

## 2016-08-17 NOTE — Progress Notes (Signed)
Preliminary results by tech - Venous Duplex Lower Ext. Completed. Negative for deep and superficial vein thrombosis in both legs.  Madge Therrien, BS, RDMS, RVT  

## 2016-08-18 ENCOUNTER — Telehealth: Payer: Self-pay | Admitting: Oncology

## 2016-08-18 NOTE — Telephone Encounter (Signed)
lvm re 3/20 appt at 10 am per LOS

## 2016-08-25 ENCOUNTER — Ambulatory Visit (HOSPITAL_BASED_OUTPATIENT_CLINIC_OR_DEPARTMENT_OTHER): Payer: 59 | Admitting: Oncology

## 2016-08-25 ENCOUNTER — Telehealth: Payer: Self-pay | Admitting: Oncology

## 2016-08-25 ENCOUNTER — Ambulatory Visit: Payer: 59

## 2016-08-25 VITALS — BP 124/68 | HR 58 | Temp 98.6°F | Resp 17 | Wt 250.2 lb

## 2016-08-25 DIAGNOSIS — C787 Secondary malignant neoplasm of liver and intrahepatic bile duct: Secondary | ICD-10-CM

## 2016-08-25 DIAGNOSIS — C182 Malignant neoplasm of ascending colon: Secondary | ICD-10-CM

## 2016-08-25 DIAGNOSIS — R1900 Intra-abdominal and pelvic swelling, mass and lump, unspecified site: Secondary | ICD-10-CM

## 2016-08-25 DIAGNOSIS — Z95828 Presence of other vascular implants and grafts: Secondary | ICD-10-CM

## 2016-08-25 MED ORDER — HEPARIN SOD (PORK) LOCK FLUSH 100 UNIT/ML IV SOLN
500.0000 [IU] | Freq: Once | INTRAVENOUS | Status: AC | PRN
  Administered 2016-08-25: 500 [IU] via INTRAVENOUS
  Filled 2016-08-25: qty 5

## 2016-08-25 MED ORDER — SODIUM CHLORIDE 0.9 % IJ SOLN
10.0000 mL | INTRAMUSCULAR | Status: DC | PRN
  Administered 2016-08-25: 10 mL via INTRAVENOUS
  Filled 2016-08-25: qty 10

## 2016-08-25 NOTE — Progress Notes (Signed)
[place] OFFICE PROGRESS NOTE   Diagnosis: Colon cancer  INTERVAL HISTORY:   Bryan Fields returns as scheduled. He underwent resection of the abdominal wall mass and an anterior peritoneal mass on 07/23/2016. The pathology confirmed metastatic adenocarcinoma with abundant mucin consistent with a colonic primary. The resection margins from the abdominal wall mass returned negative with a less than 0.17 m deep margin. Tumor involved the inked margin of the peritoneal mass resection.  The wound is slowly healing. His wife packs the wound daily. He has mild discomfort at the incision. He otherwise feels well. He is scheduled to have a total odontectomy procedure in April. He saw Dr. Aleatha Borer and is waiting on results of mutation testing to decide on clinical trial availability.  Objective:  Vital signs in last 24 hours:  Blood pressure 124/68, pulse (!) 58, temperature 98.6 F (37 C), temperature source Oral, resp. rate 17, weight 250 lb 4 oz (113.5 kg), SpO2 100 %.    HEENT: No thrush, no erythema at the gumline Resp: Lungs clear bilaterally Cardio: Regular rate and rhythm GI: 2-3 cm opening at the transverse upper abdominal wound with a gauze packing in place. No surrounding erythema. Less than 1 cm superficial opening at a drain site lateral to the main wound. Vascular: No leg edema   Portacath/PICC-without erythema  Lab Results:  Lab Results  Component Value Date   WBC 8.0 07/25/2016   HGB 13.1 07/25/2016   HCT 38.8 (L) 07/25/2016   MCV 97.2 07/25/2016   PLT 207 07/25/2016   NEUTROABS 11.1 (H) 07/07/2016    Lab Results  Component Value Date   NA 134 (L) 07/25/2016    Lab Results  Component Value Date   CEA1 1.7 03/30/2016    Imaging:  No results found.  Medications: I have reviewed the patient's current medications.  Assessment/Plan: 1. 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 Microsatellite stable; preserved expression of major and minor MMR proteins. He began treatment with FOLFOX in December 2011. He completed cycle #12 on 10/27/2010. Oxaliplatin was held with cycles number 7 through 10 and resumed with cycle #11. 1. History of neutropenia secondary to chemotherapy. 2. History of thrombocytopenia secondary chemotherapy. 3. He did not undergo a preoperative colonoscopy. He underwent a colonoscopy on 12/19/2010 with findings of a 1 cm polyp at 90 cm from the anal verge, minimal polyp at 30 cm from the anal verge and minimal diverticulosis. Colonoscopy 03/18/2012-negative. 4. 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 He began study drug on 05/13/2010. 5. Status post Port-A-Cath placement. The Port-A-Cath has been removed. 6. 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 8. Low attenuation lesion in the caudate lobe of the liver on the CT 04/11/2012-? Cyst. MRI liver on 04/20/2012 favored the caudate lobe liver lesion to represent a cyst or complex cyst. CT abdomen/pelvis 04/17/2013 with continued enlargement of hypovascular lesion in the caudate lobe of the liver.  PET scan 10/27/2011 confirmed a hypermetabolic caudate lobe liver lesion, tiny indeterminate lung nodules, and a 9 mm level II left neck node   CT biopsy of the liver lesion 11/07/2013 confirmed adenocarcinoma  Left liver and caudate resection 01/15/2014-pathology confirmed metastatic colon cancer, and negative surgical margins  Surveillance CT scans 07/24/2014 consistent with recurrent disease involving upper abdominal  lymph nodes, peritoneal implants, and an anterior abdominal wall mass  Status post biopsy of the anterior abdominal wall mass 08/06/2014 with pathology showing metastatic adenocarcinoma  consistent with a colon primary.  UGT1A1 1/28  Cycle 1 FOLFIRI/Avastin on the [facility] 1317 08/20/2014  Cycle 2 FOLFIRI/Avastin 09/03/2014  Cycle 3 held on 09/17/2014 due to neutropenia  Cycle 3 FOLFIRI/Avastin 09/25/2014. Neulasta added beginning with cycle 3.  Cycle 4 FOLFIRI/Avastin 10/08/2014  Restaging CT scans 10/18/2014 with slight interval increase in the size of the soft tissue mass in the subcutaneous fat of the epigastric region. Underlying peritoneal implants, probable focus of residual disease along the resected margin of the liver and hepatoduodenal ligament lymphadenopathy all appeared slightly improved. No new sites of metastatic disease otherwise noted. New left lower lobe bronchopneumonia.  Cycle 5 FOLFIRI/Avastin 10/22/2014  Cycle 6 FOLFIRI/Avastin 11/06/2014  Cycle 7 FOLFIRI Avastin 11/19/2014  Cycle 8 FOLFIRI/Avastin 12/03/2014  CT 12/14/2014 with stable disease  Cycle 9 FOLFIRI/Avastin 12/17/2014  Cycle 10 FOLFIRI/Avastin 01/07/2015 (5-fluorouracil and leucovorin dose reduced)  Cycle 11 FOLFIRI/Avastin 01/21/2015  Cycle 12 FOLFIRI/Avastin 02/04/2015  CT 02/13/2015 with stable disease  Cycle 13 FOLFIRI/Avastin 02/25/2015-changed to an every three-week protocol, now off study  Avastin held with cycle 14 FOLFIRI on 03/25/2015 secondary to gingivitis  Restaging CTs 05/20/2015-slight increase in the size of the epigastric subcutaneous mass, stable peritoneal implants  FOLFIRI continued on a 3 week schedule  Avastin resumed 06/17/2015  CTs 12/23/2015-stable subxiphoid mass, stable low-attenuation mass adjacent to the left liver surgical margin, peritoneal lesions-stable  FOLFIRI continued on a 4 week schedule.  Avastin placed on hold 01/06/2016  FOLFIRI discontinued 03/02/2016 secondary to enlargement of the abdominal wall mass with new superficial fungating nodules involving the skin  Palliative radiation to the abdominal wall mass completed  03/20/2016  Cycle 1 FOLFOX 06/03/2016  Cycle 2 CAPOX 06/22/2016  Clinical progression 07/07/2016 and allergic reaction to oxaliplatin 06/23/2015, CAPOX discontinued  Resection of the main abdominal wall mass and an adjacent peritoneal mass on 07/23/2016 with the pathology confirming metastatic colon cancer 10. Iron deposition within the liver noted on MRI 04/20/2012. The ferritin level was normal on 04/17/2013. Increased iron deposition noted on the left liver resection 01/15/2014 11. Pain/bleeding at the anus reported when here 04/24/2013. Status post evaluation by Dr. [name] with findings of an anal fissure. 12. Right face cystic lesion 13. Status post Port-A-Cath placement 08/14/2014 14. Delayed nausea following FOLFIRI, prophylactic Decadron added with cycle 2 with improvement. 15. Chest CT 10/18/2014 with new left lower lobe bronchopneumonia. Levaquin initiated. Resolved on CT 12/14/2014 16. Gum pain-mucositis?, Gingivitis?-Improved with discontinuation of Avastin 17. Pain/tenderness, mild erythema and full appearance over the maxillary sinuses. Maxillofacial CT 01/30/2015 with mild to moderate bilateral maxillary and sphenoid sinus inflammatory changes. Multifocal right maxillary dental periapical lucency. Status post a right upper tooth extraction with resolution of the pain.  Disposition:  Bryan Fields underwent resection of the abdominal wall mass last month. The wound is slowly healing. He is waiting on the results of molecular testing at [facility] to decide on the next systemic therapy.  Bryan Fields will return for an office visit and Port-A-Cath flush in 6 weeks.  Betsy Coder, MD  08/25/2016  10:49 AM

## 2016-08-25 NOTE — Telephone Encounter (Signed)
Appointments scheduled per 3.20.18 LOS. Patient given AVS report and calendars with future scheduled appointments.  °

## 2016-09-28 ENCOUNTER — Telehealth: Payer: Self-pay | Admitting: *Deleted

## 2016-09-28 NOTE — Telephone Encounter (Signed)
Dr. Benay Spice discussed case with Dr. Aleatha Borer. Called pt with appt for 4/25 @ 10. He agreed to come in this week.

## 2016-09-30 ENCOUNTER — Telehealth: Payer: Self-pay | Admitting: *Deleted

## 2016-09-30 ENCOUNTER — Other Ambulatory Visit: Payer: Self-pay | Admitting: *Deleted

## 2016-09-30 ENCOUNTER — Ambulatory Visit (HOSPITAL_BASED_OUTPATIENT_CLINIC_OR_DEPARTMENT_OTHER): Payer: 59 | Admitting: Oncology

## 2016-09-30 ENCOUNTER — Telehealth: Payer: Self-pay | Admitting: Oncology

## 2016-09-30 ENCOUNTER — Ambulatory Visit (HOSPITAL_BASED_OUTPATIENT_CLINIC_OR_DEPARTMENT_OTHER): Payer: 59

## 2016-09-30 VITALS — BP 131/69 | HR 63 | Temp 98.9°F | Resp 20 | Wt 253.1 lb

## 2016-09-30 DIAGNOSIS — C189 Malignant neoplasm of colon, unspecified: Secondary | ICD-10-CM

## 2016-09-30 DIAGNOSIS — C787 Secondary malignant neoplasm of liver and intrahepatic bile duct: Secondary | ICD-10-CM

## 2016-09-30 DIAGNOSIS — C182 Malignant neoplasm of ascending colon: Secondary | ICD-10-CM

## 2016-09-30 LAB — CBC WITH DIFFERENTIAL/PLATELET
BASO%: 0.3 % (ref 0.0–2.0)
Basophils Absolute: 0 10*3/uL (ref 0.0–0.1)
EOS%: 1.1 % (ref 0.0–7.0)
Eosinophils Absolute: 0.1 10*3/uL (ref 0.0–0.5)
HEMATOCRIT: 42.7 % (ref 38.4–49.9)
HEMOGLOBIN: 14.6 g/dL (ref 13.0–17.1)
LYMPH%: 23.6 % (ref 14.0–49.0)
MCH: 33.3 pg (ref 27.2–33.4)
MCHC: 34.2 g/dL (ref 32.0–36.0)
MCV: 97.4 fL (ref 79.3–98.0)
MONO#: 0.6 10*3/uL (ref 0.1–0.9)
MONO%: 10.2 % (ref 0.0–14.0)
NEUT#: 3.6 10*3/uL (ref 1.5–6.5)
NEUT%: 64.8 % (ref 39.0–75.0)
PLATELETS: 257 10*3/uL (ref 140–400)
RBC: 4.38 10*6/uL (ref 4.20–5.82)
RDW: 13.2 % (ref 11.0–14.6)
WBC: 5.5 10*3/uL (ref 4.0–10.3)
lymph#: 1.3 10*3/uL (ref 0.9–3.3)

## 2016-09-30 LAB — COMPREHENSIVE METABOLIC PANEL
ALBUMIN: 3.9 g/dL (ref 3.5–5.0)
ALK PHOS: 167 U/L — AB (ref 40–150)
ALT: 307 U/L (ref 0–55)
ANION GAP: 8 meq/L (ref 3–11)
AST: 344 U/L (ref 5–34)
BILIRUBIN TOTAL: 2.29 mg/dL — AB (ref 0.20–1.20)
BUN: 13.8 mg/dL (ref 7.0–26.0)
CO2: 29 meq/L (ref 22–29)
Calcium: 9.6 mg/dL (ref 8.4–10.4)
Chloride: 101 mEq/L (ref 98–109)
Creatinine: 1.4 mg/dL — ABNORMAL HIGH (ref 0.7–1.3)
EGFR: 54 mL/min/{1.73_m2} — AB (ref >90)
GLUCOSE: 103 mg/dL (ref 70–140)
POTASSIUM: 4.6 meq/L (ref 3.5–5.1)
SODIUM: 138 meq/L (ref 136–145)
Total Protein: 7.7 g/dL (ref 6.4–8.3)

## 2016-09-30 NOTE — Telephone Encounter (Signed)
Scheduled appts per 4/25 los.

## 2016-09-30 NOTE — Progress Notes (Signed)
[name] OFFICE PROGRESS NOTE   Diagnosis: Colon cancer  INTERVAL HISTORY:   Mr. Babers underwent total odontectomy on 09/24/2016. Upper dentures are now in place. The lower gumline remains swollen. The abdominal wound is healing. He continues to pack the wound. He saw Dr. Aleatha Borer on 09/18/2016. Restaging CTs revealed progressive disease involving peritoneal soft tissue nodules, a new necrotic soft tissue nodule at the chest wall resection site, increased soft tissue nodules at the liver resection site, and a new liver metastasis. Dr. Aleatha Borer discussed the [facility] study with [name]. She recommends treatment with Lonsurf while waiting on the next study cohort to open. He reports feeling as though he strained a muscle at the right low posterior lateral chest. Objective:  Vital signs in last 24 hours:  Blood pressure 131/69, pulse 63, temperature 98.9 F (37.2 C), temperature source Oral, resp. rate 20, weight 253 lb 1 oz (114.8 kg), SpO2 100 %.    HEENT: Sutures in place at the lower gumline extraction sites, upper denture plate Resp: Lungs clear bilaterally, no respiratory distress Cardio: Regular rate and rhythm GI: No hepatosplenomegaly, upper midline wound measures approximately 2 cm and has pink granulation tissue. No surrounding erythema. Vascular: No leg edema, left lower leg is slightly larger than the right side    Portacath/PICC-without erythema  Lab Results: 09/18/2016 at [facility]: Hemoglobin 15.1, platelets 288,000, ANC 3.7  BUN 20, creatinine 1.21, bili 0.7, AST 128, ALT 234, alk phosphatase 146  Medications: I have reviewed the patient's current medications.  Assessment/Plan: 1. 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 Microsatellite stable; preserved expression of major and minor MMR proteins. He began treatment with FOLFOX in December 2011.  He completed cycle #12 on 10/27/2010. Oxaliplatin was held with cycles number 7 through 10 and resumed with cycle #11. 1. History of neutropenia secondary to chemotherapy. 2. History of thrombocytopenia secondary chemotherapy. 3. He did not undergo a preoperative colonoscopy. He underwent a colonoscopy on 12/19/2010 with findings of a 1 cm polyp at 90 cm from the anal verge, minimal polyp at 30 cm from the anal verge and minimal diverticulosis. Colonoscopy 03/18/2012-negative. 4. 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 He began study drug on 05/13/2010. 5. Status post Port-A-Cath placement. The Port-A-Cath has been removed. 6. 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 8. Low attenuation lesion in the caudate lobe of the liver on the CT 04/11/2012-? Cyst. MRI liver on 04/20/2012 favored the caudate lobe liver lesion to represent a cyst or complex cyst. CT abdomen/pelvis 04/17/2013 with continued enlargement of hypovascular lesion in the caudate lobe of the liver.  PET scan 10/27/2011 confirmed a hypermetabolic caudate lobe liver lesion, tiny indeterminate lung nodules, and a 9 mm level II left neck node   CT biopsy of the liver lesion 11/07/2013 confirmed adenocarcinoma  Left liver and caudate resection 01/15/2014-pathology confirmed metastatic colon cancer, and negative surgical margins  Surveillance CT scans 07/24/2014 consistent with recurrent disease involving upper abdominal lymph nodes, peritoneal implants, and an anterior abdominal wall mass  Status post biopsy of the anterior abdominal wall mass 08/06/2014 with pathology showing metastatic adenocarcinoma consistent with a colon primary.  UGT1A1 1/28  Cycle 1 FOLFIRI/Avastin on the [facility] 1317 08/20/2014  Cycle 2 FOLFIRI/Avastin 09/03/2014  Cycle 3 held on  09/17/2014 due to neutropenia  Cycle 3 FOLFIRI/Avastin 09/25/2014. Neulasta added beginning with cycle 3.  Cycle 4 FOLFIRI/Avastin 10/08/2014  Restaging CT scans 10/18/2014 with slight interval increase in the size of the soft tissue mass in the subcutaneous fat of the epigastric region. Underlying peritoneal implants, probable focus of residual disease along the resected margin of the liver and hepatoduodenal ligament lymphadenopathy all appeared slightly improved. No new sites of metastatic disease otherwise noted. New left lower lobe bronchopneumonia.  Cycle 5 FOLFIRI/Avastin 10/22/2014  Cycle 6 FOLFIRI/Avastin 11/06/2014  Cycle 7 FOLFIRI Avastin 11/19/2014  Cycle 8 FOLFIRI/Avastin 12/03/2014  CT 12/14/2014 with stable disease  Cycle 9 FOLFIRI/Avastin 12/17/2014  Cycle 10 FOLFIRI/Avastin 01/07/2015 (5-fluorouracil and leucovorin dose reduced)  Cycle 11 FOLFIRI/Avastin 01/21/2015  Cycle 12 FOLFIRI/Avastin 02/04/2015  CT 02/13/2015 with stable disease  Cycle 13 FOLFIRI/Avastin 02/25/2015-changed to an every three-week protocol, now off study  Avastin held with cycle 14 FOLFIRI on 03/25/2015 secondary to gingivitis  Restaging CTs 05/20/2015-slight increase in the size of the epigastric subcutaneous mass, stable peritoneal implants  FOLFIRI continued on a 3 week schedule  Avastin resumed 06/17/2015  CTs 12/23/2015-stable subxiphoid mass, stable low-attenuation mass adjacent to the left liver surgical margin, peritoneal lesions-stable  FOLFIRI continued on a 4 week schedule.  Avastin placed on hold 01/06/2016  FOLFIRI discontinued 03/02/2016 secondary to enlargement of the abdominal wall mass with new superficial fungating nodules involving the skin  Palliative radiation to the abdominal wall mass completed 03/20/2016  Cycle 1 FOLFOX12/27/2017  Cycle 2 CAPOX 06/22/2016  Clinical progression 07/07/2016 and allergic reaction to oxaliplatin 06/23/2015, CAPOX  discontinued  Resection of the main abdominal wall mass and an adjacent peritoneal mass on 07/23/2016 with the pathology confirming metastatic colon cancer  [facility]aging CT[facility] at [facility] 09/18/2016-new liver metastasis, progressive soft tissue masses at the liver resection margin, new mass adjacent to the chest wall resection site, increase in size of peritoneal nodules 10. Iron deposition within the liver noted on MRI 04/20/2012. The ferritin level was normal on 04/17/2013. Increased iron deposition noted on the left liver resection 01/15/2014 11. Pain/bleeding at the anus reported when here 04/24/2013. Status post evaluation by Dr. [name] with findings of an anal fissure. 12. Right face cystic lesion 13. Status post Port-A-Cath placement 08/14/2014 14. Delayed nausea following FOLFIRI, prophylactic Decadron added with cycle 2 with improvement. 15. Chest CT 10/18/2014 with new left lower lobe bronchopneumonia. Levaquin initiated. Resolved on CT 12/14/2014 16. Gum pain-mucositis?, Gingivitis?-Improved with discontinuation of Avastin 17. Pain/tenderness, mild erythema and full appearance over the maxillary sinuses. Maxillofacial CT 01/30/2015 with mild to moderate bilateral maxillary and sphenoid sinus inflammatory changes. Multifocal right maxillary dental periapical lucency. Status post a right upper tooth extraction with resolution of the pain.  Disposition:  Mr. Camino has progressive metastatic colon cancer. Systemic treatment options are limited based on the RAS mutation and his treatment history. He is being considered for normal on the [facility] 1736 study at [facility]. The plan is to begin Lonsurf as an interim treatment. We reviewed the potential toxicities associated withLonsurf and he agrees to proceed. We will obtain a repeat chemistry panel to follow-up on the elevated liver enzymes noted at [facility] on 09/18/2016. If the liver enzymes returned elevated we will need to consider repeat imaging to look for  evidence of biliary obstruction.  The plan is to start [place] on 10/05/2016 if we can obtain insurance approval.  He will return for an office and lab visit on 10/21/2016.  A total of 40 minutes  were spent with the patient today. The majority of the time was used for counseling and coordination of care.  Betsy Coder, MD  09/30/2016  11:03 AM

## 2016-09-30 NOTE — Telephone Encounter (Signed)
Call placed to patient to notify him that Dr. Benay Spice has ordered a repeat BMET and a CT scan of abd/pelvis to be done 10/01/16 or 10/02/16 and that we are waiting on PA for CT at this time. Patient also informed that Dr. Benay Spice would like to hold Lonsurf at this time. Instructed patient that we would notify him of CT time as soon as PA complete.  Patient appreciative of call and has no questions at this time.

## 2016-10-01 ENCOUNTER — Ambulatory Visit (HOSPITAL_BASED_OUTPATIENT_CLINIC_OR_DEPARTMENT_OTHER): Payer: 59

## 2016-10-01 ENCOUNTER — Other Ambulatory Visit: Payer: Self-pay | Admitting: *Deleted

## 2016-10-01 ENCOUNTER — Telehealth: Payer: Self-pay | Admitting: Oncology

## 2016-10-01 ENCOUNTER — Telehealth: Payer: Self-pay | Admitting: *Deleted

## 2016-10-01 DIAGNOSIS — C189 Malignant neoplasm of colon, unspecified: Secondary | ICD-10-CM

## 2016-10-01 DIAGNOSIS — C182 Malignant neoplasm of ascending colon: Secondary | ICD-10-CM | POA: Diagnosis not present

## 2016-10-01 DIAGNOSIS — C787 Secondary malignant neoplasm of liver and intrahepatic bile duct: Secondary | ICD-10-CM

## 2016-10-01 LAB — COMPREHENSIVE METABOLIC PANEL
ALBUMIN: 3.8 g/dL (ref 3.5–5.0)
ALK PHOS: 182 U/L — AB (ref 40–150)
ALT: 338 U/L — AB (ref 0–55)
AST: 209 U/L — AB (ref 5–34)
Anion Gap: 8 mEq/L (ref 3–11)
BUN: 12.6 mg/dL (ref 7.0–26.0)
CALCIUM: 9.3 mg/dL (ref 8.4–10.4)
CO2: 27 mEq/L (ref 22–29)
CREATININE: 1.3 mg/dL (ref 0.7–1.3)
Chloride: 104 mEq/L (ref 98–109)
EGFR: 62 mL/min/{1.73_m2} — ABNORMAL LOW (ref >90)
GLUCOSE: 107 mg/dL (ref 70–140)
Potassium: 4.4 mEq/L (ref 3.5–5.1)
SODIUM: 139 meq/L (ref 136–145)
TOTAL PROTEIN: 7.4 g/dL (ref 6.4–8.3)
Total Bilirubin: 3.18 mg/dL — ABNORMAL HIGH (ref 0.20–1.20)

## 2016-10-01 NOTE — Telephone Encounter (Signed)
Cancelled appt 5/1 per [facility]BS - left message for patients wife confirming cancellation.

## 2016-10-01 NOTE — Telephone Encounter (Signed)
Called pt, liver enzymes remain elevated. CT scan scheduled for 4/27 @ 12PM. NPO 4 hours prior to scan. Pick up oral contrast here or at radiology to drink 2 hours prior.  Pt voiced understanding. He stated he will call radiology to see if he can drink the contrast there just prior to scan.

## 2016-10-02 ENCOUNTER — Ambulatory Visit (HOSPITAL_COMMUNITY)
Admission: RE | Admit: 2016-10-02 | Discharge: 2016-10-02 | Disposition: A | Discharge status: Home / Self Care | Payer: 59 | Source: Ambulatory Visit | Attending: Oncology | Admitting: Oncology

## 2016-10-02 ENCOUNTER — Encounter (HOSPITAL_COMMUNITY): Payer: Self-pay

## 2016-10-02 DIAGNOSIS — C189 Malignant neoplasm of colon, unspecified: Secondary | ICD-10-CM | POA: Diagnosis present

## 2016-10-02 DIAGNOSIS — K7689 Other specified diseases of liver: Secondary | ICD-10-CM | POA: Diagnosis not present

## 2016-10-02 MED ORDER — IOPAMIDOL (ISOVUE-300) INJECTION 61%
INTRAVENOUS | Status: AC
  Administered 2016-10-02: 100 mL
  Filled 2016-10-02: qty 100

## 2016-10-02 MED ORDER — IOPAMIDOL (ISOVUE-300) INJECTION 61%
INTRAVENOUS | Status: AC
  Filled 2016-10-02: qty 30

## 2016-10-02 MED ORDER — IOPAMIDOL (ISOVUE-300) INJECTION 61%
INTRAVENOUS | Status: AC
  Filled 2016-10-02: qty 100

## 2016-10-06 ENCOUNTER — Ambulatory Visit: Payer: 59 | Admitting: Oncology

## 2016-10-07 ENCOUNTER — Other Ambulatory Visit: Payer: Self-pay

## 2016-10-07 ENCOUNTER — Telehealth: Payer: Self-pay | Admitting: *Deleted

## 2016-10-07 ENCOUNTER — Telehealth: Payer: Self-pay | Admitting: Gastroenterology

## 2016-10-07 DIAGNOSIS — R17 Unspecified jaundice: Secondary | ICD-10-CM

## 2016-10-07 DIAGNOSIS — D376 Neoplasm of uncertain behavior of liver, gallbladder and bile ducts: Secondary | ICD-10-CM

## 2016-10-07 DIAGNOSIS — K831 Obstruction of bile duct: Secondary | ICD-10-CM

## 2016-10-07 NOTE — Telephone Encounter (Signed)
Discussion at tumor board today.  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 Likely bile duct obstruction.  Patty, Can you call him later today or tomorrow to arrange ERCP with stent placement next Thursday May 10.  Dr. Benay Spice will be giving him heads up today.  Thanks

## 2016-10-07 NOTE — Telephone Encounter (Signed)
Call placed to patient to notify him that Dr. Ardis Hughs office will be contacting him regarding a stent placement to hold his bile duct open d/t obstruction to be done next Thursday, 10/15/16.  Patient instructed to call [facility] with any fevers or chills.  He denies any complaints at this time.  Patient also instructed that [facility] would remain on hold until his bilirubin level has decreased per Dr. Benay Spice.  Patient appreciative of call and has no questions at this time.

## 2016-10-07 NOTE — Telephone Encounter (Signed)
Appt has been scheduled for ERCP on 10/15/16 at 1130 am [facility]

## 2016-10-07 NOTE — Telephone Encounter (Signed)
ERCP scheduled, pt instructed and medications reviewed.  Patient instructions mailed to home.  Patient to call with any questions or concerns.  

## 2016-10-12 ENCOUNTER — Telehealth: Payer: Self-pay | Admitting: *Deleted

## 2016-10-12 NOTE — Telephone Encounter (Signed)
Call from pt with questions about upcoming procedure. Confirmed there is a mass obstructing biliary duct. The hope is the stent will allow this to drain, improving liver function. Pt asks if he should have lab prior to procedure? not necessary-per Dr. Benay Spice. Pt reports intermittent, low abdominal pain that lasts about 1 hour.  Dr. Benay Spice aware of call.

## 2016-10-15 ENCOUNTER — Ambulatory Visit (HOSPITAL_COMMUNITY): Payer: 59 | Admitting: Anesthesiology

## 2016-10-15 ENCOUNTER — Encounter (HOSPITAL_COMMUNITY): Payer: Self-pay

## 2016-10-15 ENCOUNTER — Ambulatory Visit (HOSPITAL_COMMUNITY)
Admission: RE | Admit: 2016-10-15 | Discharge: 2016-10-15 | Disposition: A | Discharge status: Home / Self Care | Payer: 59 | Source: Ambulatory Visit | Attending: Gastroenterology | Admitting: Gastroenterology

## 2016-10-15 ENCOUNTER — Ambulatory Visit (HOSPITAL_COMMUNITY): Payer: 59

## 2016-10-15 ENCOUNTER — Encounter (HOSPITAL_COMMUNITY)
Admission: RE | Disposition: A | Discharge status: Home / Self Care | Payer: Self-pay | Source: Ambulatory Visit | Attending: Gastroenterology

## 2016-10-15 DIAGNOSIS — R17 Unspecified jaundice: Secondary | ICD-10-CM | POA: Insufficient documentation

## 2016-10-15 DIAGNOSIS — K831 Obstruction of bile duct: Secondary | ICD-10-CM | POA: Insufficient documentation

## 2016-10-15 DIAGNOSIS — D376 Neoplasm of uncertain behavior of liver, gallbladder and bile ducts: Secondary | ICD-10-CM

## 2016-10-15 DIAGNOSIS — Z79899 Other long term (current) drug therapy: Secondary | ICD-10-CM | POA: Insufficient documentation

## 2016-10-15 DIAGNOSIS — Z85038 Personal history of other malignant neoplasm of large intestine: Secondary | ICD-10-CM | POA: Insufficient documentation

## 2016-10-15 DIAGNOSIS — T451X5A Adverse effect of antineoplastic and immunosuppressive drugs, initial encounter: Secondary | ICD-10-CM | POA: Diagnosis not present

## 2016-10-15 DIAGNOSIS — Z87891 Personal history of nicotine dependence: Secondary | ICD-10-CM | POA: Diagnosis not present

## 2016-10-15 DIAGNOSIS — R599 Enlarged lymph nodes, unspecified: Secondary | ICD-10-CM | POA: Insufficient documentation

## 2016-10-15 DIAGNOSIS — G620 Drug-induced polyneuropathy: Secondary | ICD-10-CM | POA: Diagnosis not present

## 2016-10-15 DIAGNOSIS — Z9049 Acquired absence of other specified parts of digestive tract: Secondary | ICD-10-CM | POA: Insufficient documentation

## 2016-10-15 DIAGNOSIS — C787 Secondary malignant neoplasm of liver and intrahepatic bile duct: Secondary | ICD-10-CM | POA: Diagnosis not present

## 2016-10-15 DIAGNOSIS — K839 Disease of biliary tract, unspecified: Secondary | ICD-10-CM

## 2016-10-15 DIAGNOSIS — I10 Essential (primary) hypertension: Secondary | ICD-10-CM | POA: Diagnosis not present

## 2016-10-15 HISTORY — PX: ERCP: SHX5425

## 2016-10-15 LAB — HEPATIC FUNCTION PANEL
ALT: 111 U/L — AB (ref 17–63)
AST: 48 U/L — AB (ref 15–41)
Albumin: 4.4 g/dL (ref 3.5–5.0)
Alkaline Phosphatase: 145 U/L — ABNORMAL HIGH (ref 38–126)
BILIRUBIN DIRECT: 0.2 mg/dL (ref 0.1–0.5)
BILIRUBIN INDIRECT: 0.6 mg/dL (ref 0.3–0.9)
TOTAL PROTEIN: 8.1 g/dL (ref 6.5–8.1)
Total Bilirubin: 0.8 mg/dL (ref 0.3–1.2)

## 2016-10-15 SURGERY — ERCP, WITH INTERVENTION IF INDICATED
Anesthesia: General

## 2016-10-15 MED ORDER — KETOROLAC TROMETHAMINE 30 MG/ML IJ SOLN
30.0000 mg | Freq: Once | INTRAMUSCULAR | Status: DC | PRN
  Filled 2016-10-15: qty 1

## 2016-10-15 MED ORDER — LIDOCAINE 2% (20 MG/ML) 5 ML SYRINGE
INTRAMUSCULAR | Status: DC | PRN
  Administered 2016-10-15: 100 mg via INTRAVENOUS

## 2016-10-15 MED ORDER — PROPOFOL 10 MG/ML IV BOLUS
INTRAVENOUS | Status: AC
  Filled 2016-10-15: qty 20

## 2016-10-15 MED ORDER — LIDOCAINE 2% (20 MG/ML) 5 ML SYRINGE
INTRAMUSCULAR | Status: AC
  Filled 2016-10-15: qty 5

## 2016-10-15 MED ORDER — SODIUM CHLORIDE 0.9 % IV SOLN: INTRAVENOUS | Status: DC

## 2016-10-15 MED ORDER — PROPOFOL 10 MG/ML IV BOLUS
INTRAVENOUS | Status: DC | PRN
  Administered 2016-10-15: 200 mg via INTRAVENOUS

## 2016-10-15 MED ORDER — INDOMETHACIN 50 MG RE SUPP
RECTAL | Status: AC
  Filled 2016-10-15: qty 1

## 2016-10-15 MED ORDER — FENTANYL CITRATE (PF) 100 MCG/2ML IJ SOLN
INTRAMUSCULAR | Status: AC
  Filled 2016-10-15: qty 2

## 2016-10-15 MED ORDER — LACTATED RINGERS IV SOLN
INTRAVENOUS | Status: DC | PRN
  Administered 2016-10-15: 11:00:00 via INTRAVENOUS

## 2016-10-15 MED ORDER — SUCCINYLCHOLINE CHLORIDE 200 MG/10ML IV SOSY
PREFILLED_SYRINGE | INTRAVENOUS | Status: DC | PRN
  Administered 2016-10-15: 120 mg via INTRAVENOUS

## 2016-10-15 MED ORDER — CIPROFLOXACIN IN D5W 400 MG/200ML IV SOLN
INTRAVENOUS | Status: AC
  Filled 2016-10-15: qty 200

## 2016-10-15 MED ORDER — ONDANSETRON HCL 4 MG/2ML IJ SOLN
INTRAMUSCULAR | Status: AC
  Filled 2016-10-15: qty 2

## 2016-10-15 MED ORDER — GLUCAGON HCL RDNA (DIAGNOSTIC) 1 MG IJ SOLR
INTRAMUSCULAR | Status: AC
  Filled 2016-10-15: qty 1

## 2016-10-15 MED ORDER — FENTANYL CITRATE (PF) 100 MCG/2ML IJ SOLN
INTRAMUSCULAR | Status: DC | PRN
  Administered 2016-10-15: 100 ug via INTRAVENOUS

## 2016-10-15 MED ORDER — INDOMETHACIN 50 MG RE SUPP
RECTAL | Status: DC | PRN
  Administered 2016-10-15 (×2): 50 mg via RECTAL

## 2016-10-15 MED ORDER — SUCCINYLCHOLINE CHLORIDE 200 MG/10ML IV SOSY
PREFILLED_SYRINGE | INTRAVENOUS | Status: AC
  Filled 2016-10-15: qty 10

## 2016-10-15 MED ORDER — PROMETHAZINE HCL 25 MG/ML IJ SOLN: 6.2500 mg | INTRAMUSCULAR | Status: DC | PRN

## 2016-10-15 MED ORDER — HYDROMORPHONE HCL 1 MG/ML IJ SOLN: 0.2500 mg | INTRAMUSCULAR | Status: DC | PRN

## 2016-10-15 MED ORDER — IOPAMIDOL (ISOVUE-300) INJECTION 61%
INTRAVENOUS | Status: DC | PRN
  Administered 2016-10-15: 45 mg via INTRAVENOUS

## 2016-10-15 MED ORDER — ONDANSETRON HCL 4 MG/2ML IJ SOLN
INTRAMUSCULAR | Status: DC | PRN
  Administered 2016-10-15: 4 mg via INTRAVENOUS

## 2016-10-15 NOTE — Transfer of Care (Signed)
Immediate Anesthesia Transfer of Care Note  Patient: Bryan Fields  Procedure(s) Performed: Procedure(s): ENDOSCOPIC RETROGRADE CHOLANGIOPANCREATOGRAPHY (ERCP) (N/A)  Patient Location: PACU  Anesthesia Type:General  Level of Consciousness: awake, alert  and oriented  Airway & Oxygen Therapy: Patient Spontanous Breathing and Patient connected to face mask oxygen  Post-op Assessment: Report given to RN and Post -op Vital signs reviewed and stable  Post vital signs: Reviewed and stable  Last Vitals:  Vitals:   10/15/16 1052  BP: 140/72  Pulse: 72  Resp: 18  Temp: 36.7 C    Last Pain:  Vitals:   10/15/16 1052  TempSrc: Oral         Complications: No apparent anesthesia complications

## 2016-10-15 NOTE — Anesthesia Preprocedure Evaluation (Addendum)
Anesthesia Evaluation  Patient identified by MRN, date of birth, ID band Patient awake    Reviewed: Allergy & Precautions, NPO status , Patient's Chart, lab work & pertinent test results  Airway Mallampati: II  TM Distance: >3 FB Neck ROM: Full    Dental no notable dental hx.    Pulmonary neg pulmonary ROS, former smoker,    Pulmonary exam normal breath sounds clear to auscultation       Cardiovascular hypertension, Normal cardiovascular exam Rhythm:Regular Rate:Normal     Neuro/Psych negative neurological ROS  negative psych ROS   GI/Hepatic negative GI ROS, Neg liver ROS,   Endo/Other  negative endocrine ROS  Renal/GU negative Renal ROS  negative genitourinary   Musculoskeletal negative musculoskeletal ROS (+)   Abdominal   Peds negative pediatric ROS (+)  Hematology negative hematology ROS (+)   Anesthesia Other Findings   Reproductive/Obstetrics negative OB ROS                             Anesthesia Physical Anesthesia Plan  ASA: III  Anesthesia Plan: General   Post-op Pain Management:    Induction: Intravenous  Airway Management Planned: Oral ETT  Additional Equipment:   Intra-op Plan:   Post-operative Plan: Extubation in OR  Informed Consent: I have reviewed the patients History and Physical, chart, labs and discussed the procedure including the risks, benefits and alternatives for the proposed anesthesia with the patient or authorized representative who has indicated his/her understanding and acceptance.   Dental advisory given  Plan Discussed with: CRNA and Surgeon  Anesthesia Plan Comments:        Anesthesia Quick Evaluation

## 2016-10-15 NOTE — Anesthesia Procedure Notes (Signed)
Procedure Name: Intubation Date/Time: 10/15/2016 11:32 AM Performed by: Glory Buff Pre-anesthesia Checklist: Patient identified, Emergency Drugs available, Suction available and Patient being monitored Patient Re-evaluated:Patient Re-evaluated prior to inductionOxygen Delivery Method: Circle system utilized Preoxygenation: Pre-oxygenation with 100% oxygen Intubation Type: IV induction Ventilation: Mask ventilation without difficulty Laryngoscope Size: Miller and 3 Grade View: Grade I Tube type: Oral Tube size: 7.5 mm Number of attempts: 1 Airway Equipment and Method: Stylet and Oral airway Placement Confirmation: ETT inserted through vocal cords under direct vision,  positive ETCO2 and breath sounds checked- equal and bilateral Secured at: 21 cm Tube secured with: Tape Dental Injury: Teeth and Oropharynx as per pre-operative assessment

## 2016-10-15 NOTE — Discharge Instructions (Signed)

## 2016-10-15 NOTE — H&P (View-Only) (Signed)
[name] OFFICE PROGRESS NOTE   Diagnosis: Colon cancer  INTERVAL HISTORY:   [name] underwent total odontectomy on 09/24/2016. Upper dentures are now in place. The lower gumline remains swollen. The abdominal wound is healing. He continues to pack the wound. He saw Dr. Aleatha Borer on 09/18/2016. Restaging CTs revealed progressive disease involving peritoneal soft tissue nodules, a new necrotic soft tissue nodule at the chest wall resection site, increased soft tissue nodules at the liver resection site, and a new liver metastasis. Dr. Aleatha Borer discussed the [facility] study with [name]. She recommends treatment with Lonsurf while waiting on the next study cohort to open. He reports feeling as though he strained a muscle at the right low posterior lateral chest. Objective:  Vital signs in last 24 hours:  Blood pressure 131/69, pulse 63, temperature 98.9 F (37.2 C), temperature source Oral, resp. rate 20, weight 253 lb 1 oz (114.8 kg), SpO2 100 %.    HEENT: Sutures in place at the lower gumline extraction sites, upper denture plate Resp: Lungs clear bilaterally, no respiratory distress Cardio: Regular rate and rhythm GI: No hepatosplenomegaly, upper midline wound measures approximately 2 cm and has pink granulation tissue. No surrounding erythema. Vascular: No leg edema, left lower leg is slightly larger than the right side    Portacath/PICC-without erythema  Lab Results: 09/18/2016 at [facility]: Hemoglobin 15.1, platelets 288,000, ANC 3.7  BUN 20, creatinine 1.21, bili 0.7, AST 128, ALT 234, alk phosphatase 146  Medications: I have reviewed the patient's current medications.  Assessment/Plan: 1. 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 Microsatellite stable; preserved expression of major and minor MMR proteins. He began treatment with FOLFOX in December 2011.  He completed cycle #12 on 10/27/2010. Oxaliplatin was held with cycles number 7 through 10 and resumed with cycle #11. 1. History of neutropenia secondary to chemotherapy. 2. History of thrombocytopenia secondary chemotherapy. 3. He did not undergo a preoperative colonoscopy. He underwent a colonoscopy on 12/19/2010 with findings of a 1 cm polyp at 90 cm from the anal verge, minimal polyp at 30 cm from the anal verge and minimal diverticulosis. Colonoscopy 03/18/2012-negative. 4. 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 He began study drug on 05/13/2010. 5. Status post Port-A-Cath placement. The Port-A-Cath has been removed. 6. 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 8. Low attenuation lesion in the caudate lobe of the liver on the CT 04/11/2012-? Cyst. MRI liver on 04/20/2012 favored the caudate lobe liver lesion to represent a cyst or complex cyst. CT abdomen/pelvis 04/17/2013 with continued enlargement of hypovascular lesion in the caudate lobe of the liver.  PET scan 10/27/2011 confirmed a hypermetabolic caudate lobe liver lesion, tiny indeterminate lung nodules, and a 9 mm level II left neck node   CT biopsy of the liver lesion 11/07/2013 confirmed adenocarcinoma  Left liver and caudate resection 01/15/2014-pathology confirmed metastatic colon cancer, and negative surgical margins  Surveillance CT scans 07/24/2014 consistent with recurrent disease involving upper abdominal lymph nodes, peritoneal implants, and an anterior abdominal wall mass  Status post biopsy of the anterior abdominal wall mass 08/06/2014 with pathology showing metastatic adenocarcinoma consistent with a colon primary.  UGT1A1 1/28  Cycle 1 FOLFIRI/Avastin on the [facility] 1317 08/20/2014  Cycle 2 FOLFIRI/Avastin 09/03/2014  Cycle 3 held on  09/17/2014 due to neutropenia  Cycle 3 FOLFIRI/Avastin 09/25/2014. Neulasta added beginning with cycle 3.  Cycle 4 FOLFIRI/Avastin 10/08/2014  Restaging CT scans 10/18/2014 with slight interval increase in the size of the soft tissue mass in the subcutaneous fat of the epigastric region. Underlying peritoneal implants, probable focus of residual disease along the resected margin of the liver and hepatoduodenal ligament lymphadenopathy all appeared slightly improved. No new sites of metastatic disease otherwise noted. New left lower lobe bronchopneumonia.  Cycle 5 FOLFIRI/Avastin 10/22/2014  Cycle 6 FOLFIRI/Avastin 11/06/2014  Cycle 7 FOLFIRI Avastin 11/19/2014  Cycle 8 FOLFIRI/Avastin 12/03/2014  CT 12/14/2014 with stable disease  Cycle 9 FOLFIRI/Avastin 12/17/2014  Cycle 10 FOLFIRI/Avastin 01/07/2015 (5-fluorouracil and leucovorin dose reduced)  Cycle 11 FOLFIRI/Avastin 01/21/2015  Cycle 12 FOLFIRI/Avastin 02/04/2015  CT 02/13/2015 with stable disease  Cycle 13 FOLFIRI/Avastin 02/25/2015-changed to an every three-week protocol, now off study  Avastin held with cycle 14 FOLFIRI on 03/25/2015 secondary to gingivitis  Restaging CTs 05/20/2015-slight increase in the size of the epigastric subcutaneous mass, stable peritoneal implants  FOLFIRI continued on a 3 week schedule  Avastin resumed 06/17/2015  CTs 12/23/2015-stable subxiphoid mass, stable low-attenuation mass adjacent to the left liver surgical margin, peritoneal lesions-stable  FOLFIRI continued on a 4 week schedule.  Avastin placed on hold 01/06/2016  FOLFIRI discontinued 03/02/2016 secondary to enlargement of the abdominal wall mass with new superficial fungating nodules involving the skin  Palliative radiation to the abdominal wall mass completed 03/20/2016  Cycle 1 FOLFOX12/27/2017  Cycle 2 CAPOX 06/22/2016  Clinical progression 07/07/2016 and allergic reaction to oxaliplatin 06/23/2015, CAPOX  discontinued  Resection of the main abdominal wall mass and an adjacent peritoneal mass on 07/23/2016 with the pathology confirming metastatic colon cancer  [facility] CT[facility] at [facility] 09/18/2016-new liver metastasis, progressive soft tissue masses at the liver resection margin, new mass adjacent to the chest wall resection site, increase in size of peritoneal nodules 10. Iron deposition within the liver noted on MRI 04/20/2012. The ferritin level was normal on 04/17/2013. Increased iron deposition noted on the left liver resection 01/15/2014 11. Pain/bleeding at the anus reported when here 04/24/2013. Status post evaluation by Dr. [name] with findings of an anal fissure. 12. Right face cystic lesion 13. Status post Port-A-Cath placement 08/14/2014 14. Delayed nausea following FOLFIRI, prophylactic Decadron added with cycle 2 with improvement. 15. Chest CT 10/18/2014 with new left lower lobe bronchopneumonia. Levaquin initiated. Resolved on CT 12/14/2014 16. Gum pain-mucositis?, Gingivitis?-Improved with discontinuation of Avastin 17. Pain/tenderness, mild erythema and full appearance over the maxillary sinuses. Maxillofacial CT 01/30/2015 with mild to moderate bilateral maxillary and sphenoid sinus inflammatory changes. Multifocal right maxillary dental periapical lucency. Status post a right upper tooth extraction with resolution of the pain.  Disposition:  Mr. Camino has progressive metastatic colon cancer. Systemic treatment options are limited based on the RAS mutation and his treatment history. He is being considered for normal on the [facility] 1736 study at [facility], [facility], [facility]. The plan is to begin Lonsurf as an interim treatment. We reviewed the potential toxicities associated withLonsurf and he agrees to proceed. We will obtain a repeat chemistry panel to follow-up on the elevated liver enzymes noted at [facility] on 09/18/2016. If the liver enzymes returned elevated we will need to consider repeat imaging to look for  evidence of biliary obstruction.  The plan is to start [place] on 10/05/2016 if we can obtain insurance approval.  He will return for an office and lab visit on 10/21/2016.  A total of 40 minutes  were spent with the patient today. The majority of the time was used for counseling and coordination of care.  Betsy Coder, MD  09/30/2016  11:03 AM

## 2016-10-15 NOTE — Op Note (Signed)
[facility] Patient Name: Bryan Fields Procedure Date: 10/15/2016 MRN: [id number] Attending MD: Milus Banister , MD Date of Birth: 09-30-1960 CSN: [id number] Age: 56 Admit Type: Outpatient Procedure:                ERCP Indications:              Jaundice, long standing, metastatic colon cancer                            with enlarging porta hepatis lymhnode conglomerate Providers:                Milus Banister, MD, Burtis Junes, RN, Lillie Fragmin, RN, Cherylynn Ridges, Technician, Alan Mulder, Technician, Rosario Adie, CRNA Referring MD:             Julieanne Manson, MD Medicines:                General Anesthesia, Indomethacin 846 mg PR Complications:            No immediate complications. Estimated blood loss:                            None Estimated Blood Loss:     Estimated blood loss: none. Procedure:                Pre-Anesthesia Assessment:                           - Prior to the procedure, a History and Physical                            was performed, and patient medications and                            allergies were reviewed. The patient's tolerance of                            previous anesthesia was also reviewed. The risks                            and benefits of the procedure and the sedation                            options and risks were discussed with the patient.                            All questions were answered, and informed consent                            was obtained. Prior Anticoagulants: The patient has  taken no previous anticoagulant or antiplatelet                            agents. ASA Grade Assessment: III - A patient with                            severe systemic disease. After reviewing the risks                            and benefits, the patient was deemed in                            satisfactory condition to undergo the  procedure.                           After obtaining informed consent, the scope was                            passed under direct vision. Throughout the                            procedure, the patient's blood pressure, pulse, and                            oxygen saturations were monitored continuously. The                            [id number] ([id number]) scope was introduced through                            the mouth, and used to inject contrast into and                            used to inject contrast into the bile duct. The                            ERCP was accomplished without difficulty. The                            patient tolerated the procedure well. Scope In: Scope Out: Findings:      The scout film showed surgical clips in RUQ. The major papilla was       normal appearing. Using a 44Autotome over a .035 hydrawire the bile duct       was cannulated and contrast was injected. Cholangiogram showed normal       CBD (non-dilated). The cystic duct was patent and the gallbladder       partially filled with dye. 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 Proximal to       the CHD stricture the biliary tree was dilated. Biliary sphincterotomy       was made with a traction (standard) sphincterotome to facilitate bile       floe as well as future biliary cannulations if needed. There was no       post-sphincterotomy bleeding. One 10 mm by 6  cm uncovered metal stent       was placed across the CHD stricture in good position. The distal end of       the stent lays within the CBD (probably 1-2cm from the ampullary       orifice. There was good flow of old, darkish bile into the duodenum       following stent placement. The main pancreatic duct was cannulated with       wire but never injected with dye. 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 This stricture was stented                            with a 6cm long, uncovered 74mm diameter metal bile                            duct stent in good position. The distal end of the                            stent lays within the CBD (approximately 1-2cm from                            the ampullary orifice). A biliary sphincterotomy                            was performed to faciliate bile drainage and also                            facilitate future biliary interventions if needed. Moderate Sedation:      N/A- Per Anesthesia Care Recommendation:           - Discharge patient to home (ambulatory).                           [name] clinically, follow liver tests. Note                            that LFTS already trending down by a set drawn just                            prior to this ERCP which probably reflect                            intermittent nature of early biliary obstruction                            from the large porta-hepatis mass. Procedure Code(s):        --- Professional ---  46503, Endoscopic retrograde                            cholangiopancreatography (ERCP); with placement of                            endoscopic stent into biliary or pancreatic duct,                            including pre- and post-dilation and guide wire                            passage, when performed, including sphincterotomy,                            when performed, each stent Diagnosis Code(s):        --- Professional ---                           R17, Unspecified jaundice                           R93.5, Abnormal findings on diagnostic imaging of                            other abdominal regions, including retroperitoneum CPT copyright 2016 [employer]. All rights reserved. The codes documented in this report are preliminary and upon coder review may  be revised to meet current compliance requirements. Milus Banister,  MD 10/15/2016 12:21:33 PM This report has been signed electronically. Number of Addenda: 0

## 2016-10-15 NOTE — Anesthesia Postprocedure Evaluation (Signed)
Anesthesia Post Note  Patient: Bryan Fields  Procedure(s) Performed: Procedure(s) (LRB): ENDOSCOPIC RETROGRADE CHOLANGIOPANCREATOGRAPHY (ERCP) (N/A)  Patient location during evaluation: PACU Anesthesia Type: General Level of consciousness: awake and alert Pain management: pain level controlled Vital Signs Assessment: post-procedure vital signs reviewed and stable Respiratory status: spontaneous breathing, nonlabored ventilation, respiratory function stable and patient connected to nasal cannula oxygen Cardiovascular status: blood pressure returned to baseline and stable Postop Assessment: no signs of nausea or vomiting Anesthetic complications: no       Last Vitals:  Vitals:   10/15/16 1240 10/15/16 1250  BP: (!) 191/78 (!) 182/79  Pulse: 64 (!) 55  Resp: 15 13  Temp:  36.7 C    Last Pain:  Vitals:   10/15/16 1250  TempSrc: Oral                 Jinnie Onley S

## 2016-10-15 NOTE — Interval H&P Note (Signed)
History and Physical Interval Note:  10/15/2016 11:20 AM  Bryan Fields  has presented today for surgery, with the diagnosis of stent placement   The various methods of treatment have been discussed with the patient and family. After consideration of risks, benefits and other options for treatment, the patient has consented to  Procedure(s): ENDOSCOPIC RETROGRADE CHOLANGIOPANCREATOGRAPHY (ERCP) (N/A) as a surgical intervention .  The patient's history has been reviewed, patient examined, no change in status, stable for surgery.  I have reviewed the patient's chart and labs.  Questions were answered to the patient's satisfaction.     Milus Banister

## 2016-10-16 ENCOUNTER — Encounter (HOSPITAL_COMMUNITY): Payer: Self-pay | Admitting: Gastroenterology

## 2016-10-19 ENCOUNTER — Ambulatory Visit (HOSPITAL_BASED_OUTPATIENT_CLINIC_OR_DEPARTMENT_OTHER): Payer: 59 | Admitting: Nurse Practitioner

## 2016-10-19 ENCOUNTER — Telehealth: Payer: Self-pay | Admitting: *Deleted

## 2016-10-19 ENCOUNTER — Ambulatory Visit (HOSPITAL_COMMUNITY)
Admission: RE | Admit: 2016-10-19 | Discharge: 2016-10-19 | Disposition: A | Discharge status: Home / Self Care | Payer: 59 | Source: Ambulatory Visit | Attending: Nurse Practitioner | Admitting: Nurse Practitioner

## 2016-10-19 VITALS — BP 116/73 | HR 79 | Temp 98.2°F | Resp 18 | Ht 67.0 in | Wt 252.3 lb

## 2016-10-19 DIAGNOSIS — C189 Malignant neoplasm of colon, unspecified: Secondary | ICD-10-CM

## 2016-10-19 DIAGNOSIS — C787 Secondary malignant neoplasm of liver and intrahepatic bile duct: Secondary | ICD-10-CM | POA: Diagnosis not present

## 2016-10-19 DIAGNOSIS — R1030 Lower abdominal pain, unspecified: Secondary | ICD-10-CM | POA: Diagnosis not present

## 2016-10-19 DIAGNOSIS — C182 Malignant neoplasm of ascending colon: Secondary | ICD-10-CM

## 2016-10-19 DIAGNOSIS — R109 Unspecified abdominal pain: Secondary | ICD-10-CM | POA: Insufficient documentation

## 2016-10-19 DIAGNOSIS — R935 Abnormal findings on diagnostic imaging of other abdominal regions, including retroperitoneum: Secondary | ICD-10-CM | POA: Insufficient documentation

## 2016-10-19 MED ORDER — HYDROMORPHONE HCL 2 MG PO TABS: 2.0000 mg | ORAL_TABLET | ORAL | 0 refills | Status: DC | PRN

## 2016-10-19 MED ORDER — SORBITOL 70 % PO SOLN: 30.0000 mL | Freq: Two times a day (BID) | ORAL | 0 refills | Status: DC

## 2016-10-19 NOTE — Progress Notes (Addendum)
[place] OFFICE PROGRESS NOTE   Diagnosis:  Colon cancer  INTERVAL HISTORY:   Bryan Fields returns prior to scheduled follow-up for evaluation of increased abdominal pain. He reports developing mid to low abdominal pain about 1 week after teeth were extracted. The pain has persisted. He noted no improvement following the ERCP with stent placement. The pain level has increased and is occurring more frequently. He has dull pain constantly with intermittent "sharp pains". He notes that ibuprofen plus a muscle relaxant provide some relief. He notes no relief with oxycodone. He has had no nausea or vomiting. He is experiencing constipation. He began a laxative and stool softener twice a day about 3 days ago. He had a small bowel movement yesterday. He is "passing gas". No fever.  Objective:  Vital signs in last 24 hours:  Blood pressure 116/73, pulse 79, temperature 98.2 F (36.8 C), temperature source Oral, resp. rate 18, height _0  (1.702 m), weight 252 lb 4.8 oz (114.4 kg), SpO2 100 %.    HEENT: No thrush or ulcers. Resp: Lungs clear bilaterally. Cardio: Regular rate and rhythm. GI: Abdomen appears distended. Tenderness over the right abdomen. No hepatomegaly. Bowel sounds hyperactive. Small packed wound at the upper abdomen. Vascular: No leg edema. Left lower leg is slightly larger than the right lower leg. Neuro: Alert and oriented.  Port-A-Cath without erythema.  Lab Results:  Lab Results  Component Value Date   WBC 5.5 09/30/2016   HGB 14.6 09/30/2016   HCT 42.7 09/30/2016   MCV 97.4 09/30/2016   PLT 257 09/30/2016   NEUTROABS 3.6 09/30/2016    Imaging:  No results found.  Medications: I have reviewed the patient's current medications.  Assessment/Plan: 1. 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 Microsatellite stable; preserved expression  of major and minor MMR proteins. He began treatment with FOLFOX in December 2011. He completed cycle #12 on 10/27/2010. Oxaliplatin was held with cycles number 7 through 10 and resumed with cycle #11. 1. History of neutropenia secondary to chemotherapy. 2. History of thrombocytopenia secondary chemotherapy. 3. He did not undergo a preoperative colonoscopy. He underwent a colonoscopy on 12/19/2010 with findings of a 1 cm polyp at 90 cm from the anal verge, minimal polyp at 30 cm from the anal verge and minimal diverticulosis. Colonoscopy 03/18/2012-negative. 4. 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 He began study drug on 05/13/2010. 5. Status post Port-A-Cath placement. The Port-A-Cath has been removed. 6. 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 8. Low attenuation lesion in the caudate lobe of the liver on the CT 04/11/2012-? Cyst. MRI liver on 04/20/2012 favored the caudate lobe liver lesion to represent a cyst or complex cyst. CT abdomen/pelvis 04/17/2013 with continued enlargement of hypovascular lesion in the caudate lobe of the liver.  PET scan 10/27/2011 confirmed a hypermetabolic caudate lobe liver lesion, tiny indeterminate lung nodules, and a 9 mm level II left neck node   CT biopsy of the liver lesion 11/07/2013 confirmed adenocarcinoma  Left liver and caudate resection 01/15/2014-pathology confirmed metastatic colon cancer, and negative surgical margins  Surveillance CT scans 07/24/2014 consistent with recurrent disease involving upper abdominal lymph nodes, peritoneal implants, and an anterior abdominal wall mass  Status post biopsy of the anterior abdominal wall mass 08/06/2014 with pathology  showing metastatic adenocarcinoma consistent with a colon primary.  UGT1A1 1/28  Cycle 1 FOLFIRI/Avastin on the  [facility] 1317 08/20/2014  Cycle 2 FOLFIRI/Avastin 09/03/2014  Cycle 3 held on 09/17/2014 due to neutropenia  Cycle 3 FOLFIRI/Avastin 09/25/2014. Neulasta added beginning with cycle 3.  Cycle 4 FOLFIRI/Avastin 10/08/2014  Restaging CT scans 10/18/2014 with slight interval increase in the size of the soft tissue mass in the subcutaneous fat of the epigastric region. Underlying peritoneal implants, probable focus of residual disease along the resected margin of the liver and hepatoduodenal ligament lymphadenopathy all appeared slightly improved. No new sites of metastatic disease otherwise noted. New left lower lobe bronchopneumonia.  Cycle 5 FOLFIRI/Avastin 10/22/2014  Cycle 6 FOLFIRI/Avastin 11/06/2014  Cycle 7 FOLFIRI Avastin 11/19/2014  Cycle 8 FOLFIRI/Avastin 12/03/2014  CT 12/14/2014 with stable disease  Cycle 9 FOLFIRI/Avastin 12/17/2014  Cycle 10 FOLFIRI/Avastin 01/07/2015 (5-fluorouracil and leucovorin dose reduced)  Cycle 11 FOLFIRI/Avastin 01/21/2015  Cycle 12 FOLFIRI/Avastin 02/04/2015  CT 02/13/2015 with stable disease  Cycle 13 FOLFIRI/Avastin 02/25/2015-changed to an every three-week protocol, now off study  Avastin held with cycle 14 FOLFIRI on 03/25/2015 secondary to gingivitis  Restaging CTs 05/20/2015-slight increase in the size of the epigastric subcutaneous mass, stable peritoneal implants  FOLFIRI continued on a 3 week schedule  Avastin resumed 06/17/2015  CTs 12/23/2015-stable subxiphoid mass, stable low-attenuation mass adjacent to the left liver surgical margin, peritoneal lesions-stable  FOLFIRI continued on a 4 week schedule.  Avastin placed on hold 01/06/2016  FOLFIRI discontinued 03/02/2016 secondary to enlargement of the abdominal wall mass with new superficial fungating nodules involving the skin  Palliative radiation to the abdominal wall mass completed 03/20/2016  Cycle 1 FOLFOX12/27/2017  Cycle 2 CAPOX 06/22/2016  Clinical  progression 07/07/2016 and allergic reaction to oxaliplatin 06/23/2015, CAPOX discontinued  Resection of the main abdominal wall mass and an adjacent peritoneal mass on 07/23/2016 with the pathology confirming metastatic colon cancer  [facility] at [facility] 09/18/2016-new liver metastasis, progressive soft tissue masses at the liver resection margin, new mass adjacent to the chest wall resection site, increase in size of peritoneal nodules  CT abdomen/pelvis 10/02/2016-interval progression of periportal adenopathy and recurrence along the hepatic resection margin. New hepatic program lesion superior right hepatic lobe. Interval increase in size of peritoneal implants left upper quadrant. Enlargement of an abdominal wall mass along the right rectus muscle.  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 This stricture was stented with a 6cm long, uncovered 26m diameter metal bile duct stent in good position.  10. Iron deposition within the liver noted on MRI 04/20/2012. The ferritin level was normal on 04/17/2013. Increased iron deposition noted on the left liver resection 01/15/2014 11. Pain/bleeding at the anus reported when here 04/24/2013. Status post evaluation by Dr. NLucia Gaskinswith findings of an anal fissure. 12. Right face cystic lesion 13. Status post Port-A-Cath placement 08/14/2014 14. Delayed nausea following FOLFIRI, prophylactic Decadron added with cycle 2 with improvement. 15. Chest CT 10/18/2014 with new left lower lobe bronchopneumonia. Levaquin initiated. Resolved on CT 12/14/2014 16. Gum pain-mucositis?, Gingivitis?-Improved with discontinuation of Avastin 17. Pain/tenderness, mild erythema and full appearance over the maxillary sinuses. Maxillofacial CT 01/30/2015 with mild to moderate bilateral maxillary and sphenoid sinus inflammatory changes. Multifocal right maxillary dental periapical lucency.  Status post a right upper tooth extraction with resolution of the pain.   Disposition: Mr. SArorapresents with worsening abdominal pain. He is also experiencing constipation. We referred him  for an abdominal x-ray which showed mild prominent small bowel loops in the mid and left abdomen. Early small bowel obstruction could not be excluded. He was instructed on a liquid diet. He will continue Miralax and a stool softener twice daily. He will begin sorbitol 30 mL twice a day. He was provided with a prescription for Dilaudid 2 mg 1-2 tablets every 4 hours as needed. He is scheduled to return for a follow-up visit on 10/21/2016. He will contact the office in the interim with any problems.  Patient seen with Dr. Benay Spice. 25 minutes were spent face-to-face at today's visit with the majority of that time involved in counseling/coordination of care.  Ned Card ANP/GNP-BC   10/19/2016  11:23 AM This was a shared visit with Ned Card. 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 Constipation may be related to carcinomatosis versus early obstruction. He will follow a liquid diet and began a bowel regimen. He will return for an office visit and further discussion on 10/21/2016.  The hyperbilirubinemia was improved on the day of bile duct stent placement 10/15/2016.  Julieanne Manson, M.D.

## 2016-10-19 NOTE — Telephone Encounter (Signed)
Message received from patient stating that his abdominal pain has worsened over the weekend and would like to know if he should come in to the office or go to the ED.  Dr. Benay Spice notified and order received for patient to come in to see L. Thomas NP today at 10:45AM.  Call placed back to patient to inform of time and he states that he can be here at 10:45AM today.  Message sent to scheduling.

## 2016-10-19 NOTE — Patient Instructions (Signed)
Full Liquid Diet A full liquid diet may be used:  To help you transition from a clear liquid diet to a soft diet.  When your body is healing and can only tolerate foods that are easy to digest.  Before or after certain a procedure, test, or surgery (such as stomach or intestinal surgeries).  If you have trouble swallowing or chewing. A full liquid diet includes fluids and foods that are liquid or will become liquid at room temperature. The full liquid diet gives you the proteins, fluids, salts, and minerals that you need for energy. If you continue this diet for more than 72 hours, talk to your health care provider about how many calories you need to consume. If you continue the diet for more than 5 days, talk to your health care provider about taking a multivitamin or a nutritional supplement. What do I need to know about a full liquid diet?  You may have any liquid.  You may have any food that becomes a liquid at room temperature. The food is considered a liquid if it can be poured off a spoon at room temperature.  Drink one serving of citrus or vitamin C-enriched fruit juice daily. What foods can I eat? Grains  Any grain food that can be pureed in soup (such as crackers, pasta, and rice). Hot cereal (such as farina or oatmeal) that has been blended. Talk to your health care provider or dietitian about these foods. Vegetables  Pulp-free tomato or vegetable juice. Vegetables pureed in soup. Fruits  Fruit juice, including nectars and juices with pulp. Meats and Other Protein Sources  Eggs in custard, eggnog mix, and eggs used in ice cream or pudding. Strained meats, like in baby food, may be allowed. Consult your health care provider. Dairy  Milk and milk-based beverages, including milk shakes and instant breakfast mixes. Smooth yogurt. Pureed cottage cheese. Avoid these foods if they are not well tolerated. Beverages  All beverages, including liquid nutritional supplements. Ask your  health care provider if you can have carbonated beverages. They may not be well tolerated. Condiments  Iodized salt, pepper, spices, and flavorings. Cocoa powder. Vinegar, ketchup, yellow mustard, smooth sauces (such as hollandaise, cheese sauce, or white sauce), and soy sauce. Sweets and Desserts  Custard, smooth pudding. Flavored gelatin. Tapioca, junket. Plain ice cream, sherbet, fruit ices. Frozen ice pops, frozen fudge pops, pudding pops, and other frozen bars with cream. Syrups, including chocolate syrup. Sugar, honey, jelly. Fats and Oils  Margarine, butter, cream, sour cream, and oils. Other  Broth and cream soups. Strained, broth-based soups. The items listed above may not be a complete list of recommended foods or beverages. Contact your dietitian for more options.  What foods can I not eat? Grains  All breads. Grains are not allowed unless they are pureed into soup. Vegetables  Vegetables are not allowed unless they are juiced, or cooked and pureed into soup. Fruits  Fruits are not allowed unless they are juiced. Meats and Other Protein Sources  Any meat or fish. Cooked or raw eggs. Nut butters. Dairy  Cheese. Condiments  Stone ground mustards. Fats and Oils  Fats that are coarse or chunky. Sweets and Desserts  Ice cream or other frozen desserts that have any solids in them or on top, such as nuts, chocolate chips, and pieces of cookies. Cakes. Cookies. Candy. Others  Soups with chunks or pieces in them. The items listed above may not be a complete list of foods and beverages to avoid.   Contact your dietitian for more information.  This information is not intended to replace advice given to you by your health care provider. Make sure you discuss any questions you have with your health care provider. Document Released: 05/25/2005 Document Revised: 10/31/2015 Document Reviewed: 03/30/2013 Elsevier Interactive Patient Education  2017 [employer]  

## 2016-10-21 ENCOUNTER — Inpatient Hospital Stay (HOSPITAL_COMMUNITY)
Admission: AD | Admit: 2016-10-21 | Discharge: 2016-10-26 | DRG: 375 | Disposition: A | Discharge status: Home / Self Care | Payer: 59 | Source: Ambulatory Visit | Attending: Oncology | Admitting: Oncology

## 2016-10-21 ENCOUNTER — Ambulatory Visit: Payer: 59

## 2016-10-21 ENCOUNTER — Inpatient Hospital Stay (HOSPITAL_COMMUNITY): Payer: 59

## 2016-10-21 ENCOUNTER — Other Ambulatory Visit (HOSPITAL_BASED_OUTPATIENT_CLINIC_OR_DEPARTMENT_OTHER): Payer: 59

## 2016-10-21 ENCOUNTER — Encounter (HOSPITAL_COMMUNITY): Payer: Self-pay

## 2016-10-21 ENCOUNTER — Ambulatory Visit: Payer: 59 | Admitting: Nurse Practitioner

## 2016-10-21 ENCOUNTER — Ambulatory Visit (HOSPITAL_COMMUNITY)
Admission: RE | Admit: 2016-10-21 | Discharge: 2016-10-21 | Disposition: A | Discharge status: Home / Self Care | Payer: 59 | Source: Ambulatory Visit | Attending: Nurse Practitioner | Admitting: Nurse Practitioner

## 2016-10-21 VITALS — BP 131/74 | HR 78 | Temp 99.1°F | Resp 18 | Ht 67.0 in | Wt 249.4 lb

## 2016-10-21 DIAGNOSIS — Z888 Allergy status to other drugs, medicaments and biological substances status: Secondary | ICD-10-CM

## 2016-10-21 DIAGNOSIS — K56699 Other intestinal obstruction unspecified as to partial versus complete obstruction: Secondary | ICD-10-CM

## 2016-10-21 DIAGNOSIS — C787 Secondary malignant neoplasm of liver and intrahepatic bile duct: Secondary | ICD-10-CM | POA: Diagnosis present

## 2016-10-21 DIAGNOSIS — K56609 Unspecified intestinal obstruction, unspecified as to partial versus complete obstruction: Secondary | ICD-10-CM

## 2016-10-21 DIAGNOSIS — Z87891 Personal history of nicotine dependence: Secondary | ICD-10-CM

## 2016-10-21 DIAGNOSIS — R0602 Shortness of breath: Secondary | ICD-10-CM | POA: Diagnosis not present

## 2016-10-21 DIAGNOSIS — C189 Malignant neoplasm of colon, unspecified: Secondary | ICD-10-CM

## 2016-10-21 DIAGNOSIS — M545 Low back pain: Secondary | ICD-10-CM | POA: Diagnosis not present

## 2016-10-21 DIAGNOSIS — K219 Gastro-esophageal reflux disease without esophagitis: Secondary | ICD-10-CM | POA: Diagnosis present

## 2016-10-21 DIAGNOSIS — M549 Dorsalgia, unspecified: Secondary | ICD-10-CM | POA: Diagnosis present

## 2016-10-21 DIAGNOSIS — C19 Malignant neoplasm of rectosigmoid junction: Principal | ICD-10-CM | POA: Diagnosis present

## 2016-10-21 DIAGNOSIS — K5900 Constipation, unspecified: Secondary | ICD-10-CM | POA: Diagnosis present

## 2016-10-21 DIAGNOSIS — R740 Nonspecific elevation of levels of transaminase and lactic acid dehydrogenase [LDH]: Secondary | ICD-10-CM | POA: Diagnosis present

## 2016-10-21 DIAGNOSIS — K5651 Intestinal adhesions [bands], with partial obstruction: Secondary | ICD-10-CM | POA: Diagnosis present

## 2016-10-21 DIAGNOSIS — T451X5A Adverse effect of antineoplastic and immunosuppressive drugs, initial encounter: Secondary | ICD-10-CM | POA: Diagnosis present

## 2016-10-21 DIAGNOSIS — C187 Malignant neoplasm of sigmoid colon: Secondary | ICD-10-CM

## 2016-10-21 DIAGNOSIS — R1310 Dysphagia, unspecified: Secondary | ICD-10-CM | POA: Diagnosis not present

## 2016-10-21 DIAGNOSIS — C182 Malignant neoplasm of ascending colon: Secondary | ICD-10-CM

## 2016-10-21 DIAGNOSIS — Z0189 Encounter for other specified special examinations: Secondary | ICD-10-CM

## 2016-10-21 DIAGNOSIS — Z86010 Personal history of colonic polyps: Secondary | ICD-10-CM

## 2016-10-21 DIAGNOSIS — Z95828 Presence of other vascular implants and grafts: Secondary | ICD-10-CM

## 2016-10-21 DIAGNOSIS — G620 Drug-induced polyneuropathy: Secondary | ICD-10-CM | POA: Diagnosis present

## 2016-10-21 DIAGNOSIS — R109 Unspecified abdominal pain: Secondary | ICD-10-CM | POA: Diagnosis present

## 2016-10-21 DIAGNOSIS — I10 Essential (primary) hypertension: Secondary | ICD-10-CM | POA: Diagnosis present

## 2016-10-21 LAB — COMPREHENSIVE METABOLIC PANEL
ALT: 50 U/L (ref 0–55)
ANION GAP: 10 meq/L (ref 3–11)
AST: 39 U/L — AB (ref 5–34)
Albumin: 3.8 g/dL (ref 3.5–5.0)
Alkaline Phosphatase: 117 U/L (ref 40–150)
BILIRUBIN TOTAL: 0.96 mg/dL (ref 0.20–1.20)
BUN: 16.5 mg/dL (ref 7.0–26.0)
CHLORIDE: 102 meq/L (ref 98–109)
CO2: 26 meq/L (ref 22–29)
Calcium: 9.8 mg/dL (ref 8.4–10.4)
Creatinine: 1 mg/dL (ref 0.7–1.3)
EGFR: 80 mL/min/{1.73_m2} — ABNORMAL LOW (ref >90)
GLUCOSE: 128 mg/dL (ref 70–140)
Potassium: 4.1 mEq/L (ref 3.5–5.1)
SODIUM: 138 meq/L (ref 136–145)
Total Protein: 7.8 g/dL (ref 6.4–8.3)

## 2016-10-21 LAB — CBC WITH DIFFERENTIAL/PLATELET
BASO%: 0.2 % (ref 0.0–2.0)
Basophils Absolute: 0 10*3/uL (ref 0.0–0.1)
EOS%: 0.3 % (ref 0.0–7.0)
Eosinophils Absolute: 0 10*3/uL (ref 0.0–0.5)
HCT: 40.8 % (ref 38.4–49.9)
HGB: 13.9 g/dL (ref 13.0–17.1)
LYMPH#: 0.9 10*3/uL (ref 0.9–3.3)
LYMPH%: 10.7 % — AB (ref 14.0–49.0)
MCH: 33.1 pg (ref 27.2–33.4)
MCHC: 33.9 g/dL (ref 32.0–36.0)
MCV: 97.5 fL (ref 79.3–98.0)
MONO#: 0.6 10*3/uL (ref 0.1–0.9)
MONO%: 7.8 % (ref 0.0–14.0)
NEUT%: 81 % — ABNORMAL HIGH (ref 39.0–75.0)
NEUTROS ABS: 6.6 10*3/uL — AB (ref 1.5–6.5)
PLATELETS: 205 10*3/uL (ref 140–400)
RBC: 4.19 10*6/uL — ABNORMAL LOW (ref 4.20–5.82)
RDW: 13.1 % (ref 11.0–14.6)
WBC: 8.1 10*3/uL (ref 4.0–10.3)

## 2016-10-21 MED ORDER — ENOXAPARIN SODIUM 40 MG/0.4ML ~~LOC~~ SOLN: 40.0000 mg | SUBCUTANEOUS | Status: DC

## 2016-10-21 MED ORDER — HEPARIN SOD (PORK) LOCK FLUSH 100 UNIT/ML IV SOLN
500.0000 [IU] | Freq: Once | INTRAVENOUS | Status: DC | PRN
  Filled 2016-10-21: qty 5

## 2016-10-21 MED ORDER — SODIUM CHLORIDE 0.9 % IV SOLN
INTRAVENOUS | Status: DC
  Administered 2016-10-21 – 2016-10-22 (×4): via INTRAVENOUS
  Administered 2016-10-23: 1000 mL via INTRAVENOUS
  Administered 2016-10-23 – 2016-10-25 (×4): via INTRAVENOUS

## 2016-10-21 MED ORDER — SODIUM CHLORIDE 0.9 % IV SOLN
8.0000 mg | Freq: Three times a day (TID) | INTRAVENOUS | Status: DC | PRN
  Administered 2016-10-21 – 2016-10-23 (×2): 8 mg via INTRAVENOUS
  Filled 2016-10-21 (×2): qty 4

## 2016-10-21 MED ORDER — DIATRIZOATE MEGLUMINE & SODIUM 66-10 % PO SOLN
90.0000 mL | Freq: Once | ORAL | Status: AC
  Administered 2016-10-22: 90 mL via NASOGASTRIC
  Filled 2016-10-21: qty 90

## 2016-10-21 MED ORDER — LIDOCAINE-PRILOCAINE 2.5-2.5 % EX CREA
1.0000 | TOPICAL_CREAM | CUTANEOUS | Status: DC | PRN
Start: 2016-10-21 — End: 2016-10-26

## 2016-10-21 MED ORDER — HYDROMORPHONE HCL 1 MG/ML IJ SOLN
0.5000 mg | INTRAMUSCULAR | Status: DC | PRN
  Administered 2016-10-21 – 2016-10-22 (×5): 1 mg via INTRAVENOUS
  Filled 2016-10-21 (×5): qty 1

## 2016-10-21 MED ORDER — ATENOLOL 50 MG PO TABS
50.0000 mg | ORAL_TABLET | Freq: Every day | ORAL | Status: DC
  Administered 2016-10-22 – 2016-10-26 (×5): 50 mg via ORAL
  Filled 2016-10-21 (×5): qty 1

## 2016-10-21 MED ORDER — SODIUM CHLORIDE 0.9 % IJ SOLN
10.0000 mL | INTRAMUSCULAR | Status: DC | PRN
  Administered 2016-10-21: 10 mL via INTRAVENOUS
  Filled 2016-10-21: qty 10

## 2016-10-21 MED ORDER — ENOXAPARIN SODIUM 40 MG/0.4ML ~~LOC~~ SOLN
40.0000 mg | SUBCUTANEOUS | Status: DC
  Administered 2016-10-21 – 2016-10-25 (×5): 40 mg via SUBCUTANEOUS
  Filled 2016-10-21 (×5): qty 0.4

## 2016-10-21 NOTE — Progress Notes (Signed)
[place] OFFICE PROGRESS NOTE   Diagnosis:  Colon cancer  INTERVAL HISTORY:   Bryan Fields returns as scheduled. He was seen earlier this week for evaluation of progressive abdominal pain. He was started on a liquid diet and bowel regimen. He is seen today for follow-up. He reports having 3 bowel movements 10/19/2016 after beginning the laxative regimen. He had no bowel movement yesterday. He began having nausea/vomiting 10/20/2016. He had an episode of increased abdominal pain with associated vomiting and diarrhea early this morning. He notes improved pain control with Dilaudid. Oral intake is poor. No fever.  Objective:  Vital signs in last 24 hours:  Blood pressure 131/74, pulse 78, temperature 99.1 F (37.3 C), temperature source Oral, resp. rate 18, height '5\' 7"'$  (1.702 m), weight 249 lb 6.4 oz (113.1 kg), SpO2 100 %.    HEENT: No thrush or ulcers. Resp: Lungs clear bilaterally. Cardio: Regular rate and rhythm. GI: Abdomen is distended, tender. Bowel sounds present. No hepatomegaly. Vascular: No leg edema. Skin: Upper abdominal wound is without surrounding erythema or drainage.  Port-A-Cath without erythema.  Lab Results:  Lab Results  Component Value Date   WBC 8.1 10/21/2016   HGB 13.9 10/21/2016   HCT 40.8 10/21/2016   MCV 97.5 10/21/2016   PLT 205 10/21/2016   NEUTROABS 6.6 (H) 10/21/2016    Imaging:  Dg Abd 2 Views  Result Date: 10/21/2016 CLINICAL DATA:  History of colonic malignancy, liver resection with stent placement. Now with severe abdominal pain and nausea and vomiting for the past 2 weeks. EXAM: ABDOMEN - 2 VIEW COMPARISON:  Abdominal series of Oct 19 2016 FINDINGS: Increased gaseous and fluid distention of small-bowel loops is noted throughout the abdomen. There is some gas and fluid in the transverse and proximal descending portions of the colon without abnormal distention. No free extraluminal gas collections are observed. There is no  significant rectal gas. A biliary stent is present on the right. A radiodense structure projects over the right upper quadrant as well and is not new. There are numerous surgical clips in the right upper quadrant medially. IMPRESSION: Findings compatible with an ileus or partial distal small bowel obstruction. There is no evidence of perforation. Electronically Signed   By: David  Martinique M.D.   On: 10/21/2016 12:54    Medications: I have reviewed the patient's current medications.  Assessment/Plan: 1. 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 Microsatellite stable; preserved expression of major and minor MMR proteins. He began treatment with FOLFOX in December 2011. He completed cycle #12 on 10/27/2010. Oxaliplatin was held with cycles number 7 through 10 and resumed with cycle #11. 1. History of neutropenia secondary to chemotherapy. 2. History of thrombocytopenia secondary chemotherapy. 3. He did not undergo a preoperative colonoscopy. He underwent a colonoscopy on 12/19/2010 with findings of a 1 cm polyp at 90 cm from the anal verge, minimal polyp at 30 cm from the anal verge and minimal diverticulosis. Colonoscopy 03/18/2012-negative. 4. 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 He began study drug on 05/13/2010. 5. Status post Port-A-Cath placement. The Port-A-Cath has been removed. 6. 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 8. Low attenuation lesion in the caudate lobe of the liver on the CT 04/11/2012-? Cyst. MRI liver on 04/20/2012 favored the  caudate lobe liver lesion to represent a cyst or complex cyst. CT abdomen/pelvis 04/17/2013 with continued enlargement of hypovascular lesion in the caudate lobe of the  liver.  PET scan 10/27/2011 confirmed a hypermetabolic caudate lobe liver lesion, tiny indeterminate lung nodules, and a 9 mm level II left neck node   CT biopsy of the liver lesion 11/07/2013 confirmed adenocarcinoma  Left liver and caudate resection 01/15/2014-pathology confirmed metastatic colon cancer, and negative surgical margins  Surveillance CT scans 07/24/2014 consistent with recurrent disease involving upper abdominal lymph nodes, peritoneal implants, and an anterior abdominal wall mass  Status post biopsy of the anterior abdominal wall mass 08/06/2014 with pathology showing metastatic adenocarcinoma consistent with a colon primary.  UGT1A1 1/28  Cycle 1 FOLFIRI/Avastin on the [facility] 1317 08/20/2014  Cycle 2 FOLFIRI/Avastin 09/03/2014  Cycle 3 held on 09/17/2014 due to neutropenia  Cycle 3 FOLFIRI/Avastin 09/25/2014. Neulasta added beginning with cycle 3.  Cycle 4 FOLFIRI/Avastin 10/08/2014  Restaging CT scans 10/18/2014 with slight interval increase in the size of the soft tissue mass in the subcutaneous fat of the epigastric region. Underlying peritoneal implants, probable focus of residual disease along the resected margin of the liver and hepatoduodenal ligament lymphadenopathy all appeared slightly improved. No new sites of metastatic disease otherwise noted. New left lower lobe bronchopneumonia.  Cycle 5 FOLFIRI/Avastin 10/22/2014  Cycle 6 FOLFIRI/Avastin 11/06/2014  Cycle 7 FOLFIRI Avastin 11/19/2014  Cycle 8 FOLFIRI/Avastin 12/03/2014  CT 12/14/2014 with stable disease  Cycle 9 FOLFIRI/Avastin 12/17/2014  Cycle 10 FOLFIRI/Avastin 01/07/2015 (5-fluorouracil and leucovorin dose reduced)  Cycle 11 FOLFIRI/Avastin 01/21/2015  Cycle 12 FOLFIRI/Avastin 02/04/2015  CT 02/13/2015 with stable disease  Cycle 13 FOLFIRI/Avastin 02/25/2015-changed to an every three-week protocol, now off study  Avastin held with cycle 14 FOLFIRI on 03/25/2015 secondary to  gingivitis  Restaging CTs 05/20/2015-slight increase in the size of the epigastric subcutaneous mass, stable peritoneal implants  FOLFIRI continued on a 3 week schedule  Avastin resumed 06/17/2015  CTs 12/23/2015-stable subxiphoid mass, stable low-attenuation mass adjacent to the left liver surgical margin, peritoneal lesions-stable  FOLFIRI continued on a 4 week schedule.  Avastin placed on hold 01/06/2016  FOLFIRI discontinued 03/02/2016 secondary to enlargement of the abdominal wall mass with new superficial fungating nodules involving the skin  Palliative radiation to the abdominal wall mass completed 03/20/2016  Cycle 1 FOLFOX12/27/2017  Cycle 2 CAPOX 06/22/2016  Clinical progression 07/07/2016 and allergic reaction to oxaliplatin 06/23/2015, CAPOX discontinued  Resection of the main abdominal wall mass and an adjacent peritoneal mass on 07/23/2016 with the pathology confirming metastatic colon cancer  Restaging CTs at [facility] 09/18/2016-new liver metastasis, progressive soft tissue masses at the liver resection margin, new mass adjacent to the chest wall resection site, increase in size of peritoneal nodules  CT abdomen/pelvis 10/02/2016-interval progression of periportal adenopathy and recurrence along the hepatic resection margin. New hepatic program lesion superior right hepatic lobe. Interval increase in size of peritoneal implants left upper quadrant. Enlargement of an abdominal wall mass along the right rectus muscle.  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 This stricture was stented with a 6cm long, uncovered 53m diameter metal bile duct stent in good position.  10. Iron deposition within the liver noted on MRI 04/20/2012. The ferritin level was normal on 04/17/2013. Increased iron deposition noted on the left liver resection 01/15/2014 11. Pain/bleeding at the anus reported when  here 04/24/2013. Status post evaluation by Dr. NLucia Gaskinswith findings of  an anal fissure. 12. Right face cystic lesion 13. Status post Port-A-Cath placement 08/14/2014 14. Delayed nausea following FOLFIRI, prophylactic Decadron added with cycle 2 with improvement. 15. Chest CT 10/18/2014 with new left lower lobe bronchopneumonia. Levaquin initiated. Resolved on CT 12/14/2014 16. Gum pain-mucositis?, Gingivitis?-Improved with discontinuation of Avastin 17. Pain/tenderness, mild erythema and full appearance over the maxillary sinuses. Maxillofacial CT 01/30/2015 with mild to moderate bilateral maxillary and sphenoid sinus inflammatory changes. Multifocal right maxillary dental periapical lucency. Status post a right upper tooth extraction with resolution of the pain.    Disposition: Mr. Wolak has persistent abdominal pain and more recent onset of nausea/vomiting. We referred him for a follow-up plain abdominal x-ray which shows findings compatible with ileus or partial distal small bowel obstruction. He is being admitted for bowel rest/intravenous hydration, surgery consult.  Patient seen with Dr. Benay Spice.  Ned Card ANP/GNP-BC   10/21/2016  1:17 PM
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
## 2016-10-22 ENCOUNTER — Inpatient Hospital Stay (HOSPITAL_COMMUNITY): Payer: 59

## 2016-10-22 LAB — BASIC METABOLIC PANEL
ANION GAP: 10 (ref 5–15)
BUN: 20 mg/dL (ref 6–20)
CALCIUM: 8.7 mg/dL — AB (ref 8.9–10.3)
CO2: 25 mmol/L (ref 22–32)
CREATININE: 0.92 mg/dL (ref 0.61–1.24)
Chloride: 102 mmol/L (ref 101–111)
GFR calc Af Amer: >60 mL/min (ref >60)
GLUCOSE: 88 mg/dL (ref 65–99)
Potassium: 3.7 mmol/L (ref 3.5–5.1)
Sodium: 137 mmol/L (ref 135–145)

## 2016-10-22 LAB — HIV ANTIBODY (ROUTINE TESTING W REFLEX): HIV Screen 4th Generation wRfx: NONREACTIVE

## 2016-10-22 MED ORDER — HYDROMORPHONE HCL 4 MG/ML IJ SOLN
1.0000 mg | INTRAMUSCULAR | Status: DC | PRN
  Administered 2016-10-22 – 2016-10-24 (×10): 1 mg via INTRAVENOUS
  Filled 2016-10-22 (×10): qty 1

## 2016-10-22 MED ORDER — PHENOL 1.4 % MT LIQD
1.0000 | OROMUCOSAL | Status: DC | PRN
  Filled 2016-10-22: qty 177

## 2016-10-22 NOTE — Progress Notes (Signed)
General Surgery [facility]  Assessment & Plan: Small bowel obstruction secondary to adhesions, tumor Metastatic colorectal carcinoma  NPO, NG decompression  IV hydration  AXR this AM improved, no air fluid levels  Patient passing flatus, no BM's  CT scan ordered for later today  Will follow at this point.  Encouraged ambulation.         Earnstine Regal, MD, [facility]       Office: [phone]    Chief Complaint: Small bowel obstruction  Subjective: Patient in bed, complains of back pain, family at bedside.  Passing flatus, no BM's.  Objective: Vital signs in last 24 hours: Temp:  [98.1 F (36.7 C)-99.1 F (37.3 C)] 99 F (37.2 C) (05/17 0535) Pulse Rate:  [60-80] 80 (05/17 0535) Resp:  [16-18] 16 (05/17 0535) BP: (117-139)/(67-74) 139/67 (05/17 0535) SpO2:  [98 %-100 %] 98 % (05/17 0535) Weight:  [112.9 kg (249 lb)-113.1 kg (249 lb 6.4 oz)] 112.9 kg (249 lb) (05/16 1545) Last BM Date: 10/21/16  Intake/Output from previous day: 05/16 0701 - 05/17 0700 In: 1829.4 [I.V.:1685.4; NG/GT:90; IV Piggyback:54] Out: 750 [Urine:200; Emesis/NG output:550] Intake/Output this shift: No intake/output data recorded.  Physical Exam: HEENT - sclerae clear, mucous membranes moist Neck - soft Chest - clear bilaterally Cor - RRR Abdomen - mild distension, active BS present; minimal tenderness; no obvious herniae; NG bilious Neuro - alert & oriented, no focal deficits  Lab Results:   Recent Labs  10/21/16 1115  WBC 8.1  HGB 13.9  HCT 40.8  PLT 205   BMET  Recent Labs  10/21/16 1115 10/22/16 0601  NA 138 137  K 4.1 3.7  CL  --  102  CO2 26 25  GLUCOSE 128 88  BUN 16.5 20  CREATININE 1.0 0.92  CALCIUM 9.8 8.7*   PT/INR No results for input(s): LABPROT, INR in the last 72 hours. Comprehensive Metabolic Panel:    Component Value Date/Time   NA 137 10/22/2016 0601   NA 138 10/21/2016 1115   NA 139  10/01/2016 1345   K 3.7 10/22/2016 0601   K 4.1 10/21/2016 1115   K 4.4 10/01/2016 1345   CL 102 10/22/2016 0601   CL 100 (L) 07/25/2016 0438   CL 103 04/11/2012 1257   CL 104 04/20/2011 1504   CO2 25 10/22/2016 0601   CO2 26 10/21/2016 1115   CO2 27 10/01/2016 1345   BUN 20 10/22/2016 0601   BUN 16.5 10/21/2016 1115   BUN 12.6 10/01/2016 1345   CREATININE 0.92 10/22/2016 0601   CREATININE 1.0 10/21/2016 1115   CREATININE 1.3 10/01/2016 1345   GLUCOSE 88 10/22/2016 0601   GLUCOSE 128 10/21/2016 1115   GLUCOSE 107 10/01/2016 1345   GLUCOSE 89 04/11/2012 1257   GLUCOSE 90 04/20/2011 1504   CALCIUM 8.7 (L) 10/22/2016 0601   CALCIUM 9.8 10/21/2016 1115   CALCIUM 9.3 10/01/2016 1345   AST 39 (H) 10/21/2016 1115   AST 48 (H) 10/15/2016 1040   AST 209 (HH) 10/01/2016 1345   ALT 50 10/21/2016 1115   ALT 111 (H) 10/15/2016 1040   ALT 338 (HH) 10/01/2016 1345   ALKPHOS 117 10/21/2016 1115   ALKPHOS 145 (H) 10/15/2016 1040   ALKPHOS 182 (H) 10/01/2016 1345   BILITOT 0.96 10/21/2016 1115   BILITOT 0.8 10/15/2016 1040   BILITOT 3.18 (H) 10/01/2016 1345   PROT 7.8 10/21/2016 1115   PROT  8.1 10/15/2016 1040   PROT 7.4 10/01/2016 1345   ALBUMIN 3.8 10/21/2016 1115   ALBUMIN 4.4 10/15/2016 1040   ALBUMIN 3.8 10/01/2016 1345    Studies/Results: Dg Abd 2 Views  Result Date: 10/21/2016 CLINICAL DATA:  History of colonic malignancy, liver resection with stent placement. Now with severe abdominal pain and nausea and vomiting for the past 2 weeks. EXAM: ABDOMEN - 2 VIEW COMPARISON:  Abdominal series of Oct 19 2016 FINDINGS: Increased gaseous and fluid distention of small-bowel loops is noted throughout the abdomen. There is some gas and fluid in the transverse and proximal descending portions of the colon without abnormal distention. No free extraluminal gas collections are observed. There is no significant rectal gas. A biliary stent is present on the right. A radiodense structure projects  over the right upper quadrant as well and is not new. There are numerous surgical clips in the right upper quadrant medially. IMPRESSION: Findings compatible with an ileus or partial distal small bowel obstruction. There is no evidence of perforation. Electronically Signed   By: David  Martinique M.D.   On: 10/21/2016 12:54   Dg Abd Portable 1v-small Bowel Obstruction Protocol-initial, 8 Hr Delay  Result Date: 10/22/2016 CLINICAL DATA:  Small bowel obstruction EXAM: PORTABLE ABDOMEN - 1 VIEW COMPARISON:  Oct 21, 2016 FINDINGS: There is minimal small bowel dilatation without air-fluid levels. No free air. There are postoperative changes in the upper abdomen. There is a stent in the biliary region. He amorphous calcification in the right upper quadrant. Nasogastric tube tip and side port in stomach. IMPRESSION: Mild residual small bowel dilatation without air-fluid levels. Suspect resolving obstruction. No free air. Electronically Signed   By: Lowella Grip III M.D.   On: 10/22/2016 08:42   Dg Abd Portable 1v  Result Date: 10/21/2016 CLINICAL DATA:  Repositioning of NG tube EXAM: PORTABLE ABDOMEN - 1 VIEW COMPARISON:  10/21/2016 FINDINGS: Esophageal tube tip and side-port overlies the proximal to mid stomach. Biliary stent. Oval density in the right upper quadrant as before. Postsurgical changes in the epigastric. No change in diffusely dilated small and large bowel. IMPRESSION: 1. Esophageal tube tip and side port overlie the proximal to mid stomach 2. No change in diffuse gaseous dilatation of small bowel with scattered colon gas suggesting partial bowel obstruction or ileus Electronically Signed   By: Donavan Foil M.D.   On: 10/21/2016 21:24   Dg Abd Portable 1v-small Bowel Protocol-position Verification  Result Date: 10/21/2016 CLINICAL DATA:  NG tube placement EXAM: PORTABLE ABDOMEN - 1 VIEW COMPARISON:  10/21/2016, CT 10/02/2016 FINDINGS: Mild atelectasis at the left base. Esophageal tube tip  overlies the GE junction, side-port projects over distal esophagus, further advancement by approximately 10 cm is suggested for more optimal positioning. Surgical clips in the epigastrium. Biliary stent. Stable oval radiopacity in the right upper quadrant. Continued gaseous dilatation of small and large bowel with small bowel measuring up to 4.6 cm. Surgical suture in the right lower abdomen. IMPRESSION: 1. Esophageal tube tip overlies the GE junction, the side-port overlies the distal esophagus, further advancement is recommended for more optimal positioning 2. Continued gaseous dilatation of small and large bowel which may be secondary to ileus or partial obstruction These results will be called to the ordering clinician or representative by the Radiologist Assistant, and communication documented in the PACS or zVision Dashboard. Electronically Signed   By: Donavan Foil M.D.   On: 10/21/2016 20:36      Bryan Fields M 10/22/2016  Patient ID:  Bryan Fields, male   DOB: 01-01-1961, 56 y.o.   MRN: [id number]

## 2016-10-22 NOTE — Progress Notes (Signed)
Initial Nutrition Assessment  DOCUMENTATION CODES:   Obesity unspecified  INTERVENTION:   -Diet advancement per MD -Once diet is advanced, provide Boost Plus chocolate TID- Each supplement provides 360kcal and 14g protein.   -RD to continue to monitor  NUTRITION DIAGNOSIS:   Increased nutrient needs related to cancer and cancer related treatments as evidenced by estimated needs.  GOAL:   Patient will meet greater than or equal to 90% of their needs  MONITOR:   Diet advancement, Labs, Weight trends, I & O's  REASON FOR ASSESSMENT:   Malnutrition Screening Tool    ASSESSMENT:   56 year old man with metastatic colon cancer. He has been treated with multiple systemic therapies. He underwent resection of an abdominal wall mass and adjacent peritoneal mass on 07/23/2016 with pathology confirming metastatic colon cancer. Restaging CTs at [facility] on 09/18/2016 showed new liver metastasis, progressive soft tissue masses at the liver resection margin, new mass adjacent to the chest wall resection site and increase in size of peritoneal nodules. He was scheduled to begin Lonsurf as an interim treatment prior to enrollment on a clinical trial at [facility].  Patient in room with wife at bedside. Pt with NGT for suction, output: 800 ml noted in room. Pt is NPO d/t SBO related to tumor and adhesions per surgery notes. Pt has been advised to follow a mechanical soft diet/pureed diet since having his teeth removed in early May. Pt's wife reports pt was eating well until Sunday 5/13, his intake decreased. That day he consumed some tuna and mashed potatoes. On Monday he ate a few bites of oatmeal and a protein shake that contained 30g protein. He has consumed nothing since d/t N/V. Pt likes Boost supplements and would like these once he is on a diet again. Denies any taste changes.  Per chart review, pt's weight has remained between 249-253 lb. No weight loss noted. Nutrition focused physical exam shows no  sign of depletion of muscle mass or body fat.  Labs reviewed. Medications: IV Zofran PRN  Diet Order:  Diet NPO time specified Except for: Sips with Meds  Skin:  Reviewed, no issues  Last BM:  5/16  Height:   Ht Readings from Last 1 Encounters:  10/21/16 5\' 7"  (1.702 m)    Weight:   Wt Readings from Last 1 Encounters:  10/21/16 249 lb (112.9 kg)    Ideal Body Weight:  67.2 kg  BMI:  Body mass index is 39 kg/m.  Estimated Nutritional Needs:   Kcal:  6811-5726  Protein:  100-110g  Fluid:  2.3L/day  EDUCATION NEEDS:   No education needs identified at this time  Clayton Bibles, MS, RD, LDN Pager: [phone] After Hours Pager: [phone]

## 2016-10-22 NOTE — Progress Notes (Signed)
IP PROGRESS NOTE  Subjective:   He continues to have abdominal pain. The nausea has improved. He is passing flatus. No bowel movement.  Objective: Vital signs in last 24 hours: Blood pressure 139/67, pulse 80, temperature 99 F (37.2 C), temperature source Oral, resp. rate 16, height _0  (1.702 m), weight 249 lb (112.9 kg), SpO2 98 %.  Intake/Output from previous day: 05/16 0701 - 05/17 0700 In: 1829.4 [I.V.:1685.4; NG/GT:90; IV Piggyback:54] Out: 750 [Urine:200; Emesis/NG output:550]  Physical Exam:  HEENT: No thrush. NG tube in place Lungs: Rhonchi at the left posterior base, no respiratory distress Cardiac: Regular rate and rhythm Abdomen: No hepatosplenomegaly, one-to submit her opening at the subxiphoid incision without evidence of infection Extremities: No leg edema   Portacath/PICC-without erythema  Lab Results:  Recent Labs  10/21/16 1115  WBC 8.1  HGB 13.9  HCT 40.8  PLT 205    BMET  Recent Labs  10/21/16 1115 10/22/16 0601  NA 138 137  K 4.1 3.7  CL  --  102  CO2 26 25  GLUCOSE 128 88  BUN 16.5 20  CREATININE 1.0 0.92  CALCIUM 9.8 8.7*    Studies/Results: Dg Abd 2 Views  Result Date: 10/21/2016 CLINICAL DATA:  History of colonic malignancy, liver resection with stent placement. Now with severe abdominal pain and nausea and vomiting for the past 2 weeks. EXAM: ABDOMEN - 2 VIEW COMPARISON:  Abdominal series of Oct 19 2016 FINDINGS: Increased gaseous and fluid distention of small-bowel loops is noted throughout the abdomen. There is some gas and fluid in the transverse and proximal descending portions of the colon without abnormal distention. No free extraluminal gas collections are observed. There is no significant rectal gas. A biliary stent is present on the right. A radiodense structure projects over the right upper quadrant as well and is not new. There are numerous surgical clips in the right upper quadrant medially. IMPRESSION: Findings  compatible with an ileus or partial distal small bowel obstruction. There is no evidence of perforation. Electronically Signed   By: David  Martinique M.D.   On: 10/21/2016 12:54   Dg Abd Portable 1v-small Bowel Obstruction Protocol-initial, 8 Hr Delay  Result Date: 10/22/2016 CLINICAL DATA:  Small bowel obstruction EXAM: PORTABLE ABDOMEN - 1 VIEW COMPARISON:  Oct 21, 2016 FINDINGS: There is minimal small bowel dilatation without air-fluid levels. No free air. There are postoperative changes in the upper abdomen. There is a stent in the biliary region. He amorphous calcification in the right upper quadrant. Nasogastric tube tip and side port in stomach. IMPRESSION: Mild residual small bowel dilatation without air-fluid levels. Suspect resolving obstruction. No free air. Electronically Signed   By: Lowella Grip III M.D.   On: 10/22/2016 08:42   Dg Abd Portable 1v  Result Date: 10/21/2016 CLINICAL DATA:  Repositioning of NG tube EXAM: PORTABLE ABDOMEN - 1 VIEW COMPARISON:  10/21/2016 FINDINGS: Esophageal tube tip and side-port overlies the proximal to mid stomach. Biliary stent. Oval density in the right upper quadrant as before. Postsurgical changes in the epigastric. No change in diffusely dilated small and large bowel. IMPRESSION: 1. Esophageal tube tip and side port overlie the proximal to mid stomach 2. No change in diffuse gaseous dilatation of small bowel with scattered colon gas suggesting partial bowel obstruction or ileus Electronically Signed   By: Donavan Foil M.D.   On: 10/21/2016 21:24   Dg Abd Portable 1v-small Bowel Protocol-position Verification  Result Date: 10/21/2016 CLINICAL DATA:  NG tube placement  EXAM: PORTABLE ABDOMEN - 1 VIEW COMPARISON:  10/21/2016, CT 10/02/2016 FINDINGS: Mild atelectasis at the left base. Esophageal tube tip overlies the GE junction, side-port projects over distal esophagus, further advancement by approximately 10 cm is suggested for more optimal positioning.  Surgical clips in the epigastrium. Biliary stent. Stable oval radiopacity in the right upper quadrant. Continued gaseous dilatation of small and large bowel with small bowel measuring up to 4.6 cm. Surgical suture in the right lower abdomen. IMPRESSION: 1. Esophageal tube tip overlies the GE junction, the side-port overlies the distal esophagus, further advancement is recommended for more optimal positioning 2. Continued gaseous dilatation of small and large bowel which may be secondary to ileus or partial obstruction These results will be called to the ordering clinician or representative by the Radiologist Assistant, and communication documented in the PACS or zVision Dashboard. Electronically Signed   By: Donavan Foil M.D.   On: 10/21/2016 20:36    Medications: I have reviewed the patient's current medications.  Assessment/Plan: 1. 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 Microsatellite stable; preserved expression of major and minor MMR proteins. He began treatment with FOLFOX in December 2011. He completed cycle #12 on 10/27/2010. Oxaliplatin was held with cycles number 7 through 10 and resumed with cycle #11. 1. History of neutropenia secondary to chemotherapy. 2. History of thrombocytopenia secondary chemotherapy. 3. He did not undergo a preoperative colonoscopy. He underwent a colonoscopy on 12/19/2010 with findings of a 1 cm polyp at 90 cm from the anal verge, minimal polyp at 30 cm from the anal verge and minimal diverticulosis. Colonoscopy 03/18/2012-negative. 4. 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 He began study drug on 05/13/2010. 5. Status post Port-A-Cath placement. The Port-A-Cath has been removed. 6. 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 8. Low attenuation lesion in the caudate lobe of the liver on the CT 04/11/2012-? Cyst. MRI liver on 04/20/2012 favored the caudate lobe liver lesion to represent a cyst or complex cyst. CT abdomen/pelvis 04/17/2013 with continued enlargement of hypovascular lesion in the caudate lobe of the liver.  PET scan 10/27/2011 confirmed a hypermetabolic caudate lobe liver lesion, tiny indeterminate lung nodules, and a 9 mm level II left neck node   CT biopsy of the liver lesion 11/07/2013 confirmed adenocarcinoma  Left liver and caudate resection 01/15/2014-pathology confirmed metastatic colon cancer, and negative surgical margins  Surveillance CT scans 07/24/2014 consistent with recurrent disease involving upper abdominal lymph nodes, peritoneal implants, and an anterior abdominal wall mass  Status post biopsy of the anterior abdominal wall mass 08/06/2014 with pathology showing metastatic adenocarcinoma consistent with a colon primary.  UGT1A1 1/28  Cycle 1 FOLFIRI/Avastin on the [facility] 1317 08/20/2014  Cycle 2 FOLFIRI/Avastin 09/03/2014  Cycle 3 held on 09/17/2014 due to neutropenia  Cycle 3 FOLFIRI/Avastin 09/25/2014. Neulasta added beginning with cycle 3.  Cycle 4 FOLFIRI/Avastin 10/08/2014  Restaging CT scans 10/18/2014 with slight interval increase in the size of the soft tissue mass in the subcutaneous fat of the epigastric region. Underlying peritoneal implants, probable focus of residual disease along the resected margin of the liver and hepatoduodenal ligament lymphadenopathy all appeared slightly improved. No new sites of metastatic disease otherwise noted. New left lower lobe bronchopneumonia.  Cycle 5 FOLFIRI/Avastin 10/22/2014  Cycle 6 FOLFIRI/Avastin 11/06/2014  Cycle 7 FOLFIRI Avastin 11/19/2014  Cycle 8 FOLFIRI/Avastin 12/03/2014  CT 12/14/2014 with stable disease  Cycle 9 FOLFIRI/Avastin 12/17/2014  Cycle 10 FOLFIRI/Avastin  01/07/2015 (5-fluorouracil and leucovorin dose reduced)  Cycle 11 FOLFIRI/Avastin 01/21/2015  Cycle 12 FOLFIRI/Avastin 02/04/2015  CT 02/13/2015 with stable disease  Cycle 13 FOLFIRI/Avastin 02/25/2015-changed to an every three-week protocol, now off study  Avastin held with cycle 14 FOLFIRI on 03/25/2015 secondary to gingivitis  Restaging CTs 05/20/2015-slight increase in the size of the epigastric subcutaneous mass, stable peritoneal implants  FOLFIRI continued on a 3 week schedule  Avastin resumed 06/17/2015  CTs 12/23/2015-stable subxiphoid mass, stable low-attenuation mass adjacent to the left liver surgical margin, peritoneal lesions-stable  FOLFIRI continued on a 4 week schedule.  Avastin placed on hold 01/06/2016  FOLFIRI discontinued 03/02/2016 secondary to enlargement of the abdominal wall mass with new superficial fungating nodules involving the skin  Palliative radiation to the abdominal wall mass completed 03/20/2016  Cycle 1 FOLFOX12/27/2017  Cycle 2 CAPOX 06/22/2016  Clinical progression 07/07/2016 and allergic reaction to oxaliplatin 06/23/2015, CAPOX discontinued  Resection of the main abdominal wall mass and an adjacent peritoneal mass on 07/23/2016 with the pathology confirming metastatic colon cancer  [facility]aging CTs at [facility] 09/18/2016-new liver metastasis, progressive soft tissue masses at the liver resection margin, new mass adjacent to the chest wall resection site, increase in size of peritoneal nodules  CT abdomen/pelvis 10/02/2016-interval progression of periportal adenopathy and recurrence along the hepatic resection margin. New hepatic program lesion superior right hepatic lobe. Interval increase in size of peritoneal implants left upper quadrant. Enlargement of anabdominal wall mass along the right rectus muscle.  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 This  stricture was stented with a 6cm long, uncovered 38m diameter metal bile duct stent in good position.  10. Iron deposition within the liver noted on MRI 04/20/2012. The ferritin level was normal on 04/17/2013. Increased iron deposition noted on the left liver resection 01/15/2014 11. Pain/bleeding at the anus reported when here 04/24/2013. Status post evaluation by Dr. NLucia Gaskinswith findings of an anal fissure. 12. Right face cystic lesion 13. Status post Port-A-Cath placement 08/14/2014 14. Delayed nausea following FOLFIRI, prophylactic Decadron added with cycle 2 with improvement. 15. Chest CT 10/18/2014 with new left lower lobe bronchopneumonia. Levaquin initiated. Resolved on CT 12/14/2014 16. Gum pain-mucositis?, Gingivitis?-Improved with discontinuation of Avastin 17. Pain/tenderness, mild erythema and full appearance over the maxillary sinuses. Maxillofacial CT 01/30/2015 with mild to moderate bilateral maxillary and sphenoid sinus inflammatory changes. Multifocal right maxillary dental periapical lucency. Status post a right upper tooth extraction with resolution of the pain. 18. Admission 10/21/2016 with nausea/vomiting-x-ray evidence for a partial small bowel obstruction, NG tube in place   His symptoms have improved with placement of the NG tube. I appreciate the consultfrom the surgical service. The plan is to continue intravenous hydration and NG decompression. Hopefully the obstruction will resolve with bowel rest.     LOS: 1 day   SBetsy Coder MD   10/22/2016, 1:48 PM

## 2016-10-23 ENCOUNTER — Inpatient Hospital Stay (HOSPITAL_COMMUNITY): Payer: 59

## 2016-10-23 DIAGNOSIS — M545 Low back pain: Secondary | ICD-10-CM

## 2016-10-23 DIAGNOSIS — R109 Unspecified abdominal pain: Secondary | ICD-10-CM

## 2016-10-23 NOTE — Progress Notes (Signed)
IP PROGRESS NOTE  Subjective:   He has been ambulating in the hall. He had a bowel movement last night and again this morning. He continues to have low abdominal and back pain.  Objective: Vital signs in last 24 hours: Blood pressure (!) 146/73, pulse 87, temperature 97.9 F (36.6 C), temperature source Oral, resp. rate 20, height 5\' 7"  (1.702 m), weight 249 lb (112.9 kg), SpO2 100 %.  Intake/Output from previous day: 05/17 0701 - 05/18 0700 In: 1268.8 [I.V.:1268.8] Out: 1300 [Urine:300; Emesis/NG output:1000]  Physical Exam:  HEENT: No thrush. NG tube in place Lungs: Rhonchi at the left posterior base, no respiratory distress Cardiac: Regular rate and rhythm Abdomen: Less distended, active bowel sounds Extremities: No leg edema   Portacath/PICC-without erythema  Lab Results:  Recent Labs  10/21/16 1115  WBC 8.1  HGB 13.9  HCT 40.8  PLT 205    BMET  Recent Labs  10/21/16 1115 10/22/16 0601  NA 138 137  K 4.1 3.7  CL  --  102  CO2 26 25  GLUCOSE 128 88  BUN 16.5 20  CREATININE 1.0 0.92  CALCIUM 9.8 8.7*    Studies/Results: Dg Abd 2 Views  Result Date: 10/23/2016 CLINICAL DATA:  Small bowel obstruction. EXAM: ABDOMEN - 2 VIEW COMPARISON:  Radiographs of Oct 22, 2016. FINDINGS: Distal tip of nasogastric tube seen in proximal stomach. Residual contrast is seen in the colon. No abnormal bowel dilatation is noted. Biliary stent is seen in right upper quadrant. IMPRESSION: No definite evidence of bowel obstruction or ileus. Electronically Signed   By: Oct 24, 2016, M.D.   On: 10/23/2016 08:51   Dg Abd Portable 1v-small Bowel Obstruction Protocol-initial, 8 Hr Delay  Result Date: 10/22/2016 CLINICAL DATA:  Small bowel obstruction EXAM: PORTABLE ABDOMEN - 1 VIEW COMPARISON:  Oct 21, 2016 FINDINGS: There is minimal small bowel dilatation without air-fluid levels. No free air. There are postoperative changes in the upper abdomen. There is a stent in the biliary  region. He amorphous calcification in the right upper quadrant. Nasogastric tube tip and side port in stomach. IMPRESSION: Mild residual small bowel dilatation without air-fluid levels. Suspect resolving obstruction. No free air. Electronically Signed   By: Oct 23, 2016 III M.D.   On: 10/22/2016 08:42   Dg Abd Portable 1v  Result Date: 10/21/2016 CLINICAL DATA:  Repositioning of NG tube EXAM: PORTABLE ABDOMEN - 1 VIEW COMPARISON:  10/21/2016 FINDINGS: Esophageal tube tip and side-port overlies the proximal to mid stomach. Biliary stent. Oval density in the right upper quadrant as before. Postsurgical changes in the epigastric. No change in diffusely dilated small and large bowel. IMPRESSION: 1. Esophageal tube tip and side port overlie the proximal to mid stomach 2. No change in diffuse gaseous dilatation of small bowel with scattered colon gas suggesting partial bowel obstruction or ileus Electronically Signed   By: 10/23/2016 M.D.   On: 10/21/2016 21:24   Dg Abd Portable 1v-small Bowel Protocol-position Verification  Result Date: 10/21/2016 CLINICAL DATA:  NG tube placement EXAM: PORTABLE ABDOMEN - 1 VIEW COMPARISON:  10/21/2016, CT 10/02/2016 FINDINGS: Mild atelectasis at the left base. Esophageal tube tip overlies the GE junction, side-port projects over distal esophagus, further advancement by approximately 10 cm is suggested for more optimal positioning. Surgical clips in the epigastrium. Biliary stent. Stable oval radiopacity in the right upper quadrant. Continued gaseous dilatation of small and large bowel with small bowel measuring up to 4.6 cm. Surgical suture in the right lower  abdomen. IMPRESSION: 1. Esophageal tube tip overlies the GE junction, the side-port overlies the distal esophagus, further advancement is recommended for more optimal positioning 2. Continued gaseous dilatation of small and large bowel which may be secondary to ileus or partial obstruction These results will be  called to the ordering clinician or representative by the Radiologist Assistant, and communication documented in the PACS or zVision Dashboard. Electronically Signed   By: Donavan Foil M.D.   On: 10/21/2016 20:36    Medications: I have reviewed the patient's current medications.  Assessment/Plan: 1. 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 Microsatellite stable; preserved expression of major and minor MMR proteins. He began treatment with FOLFOX in December 2011. He completed cycle #12 on 10/27/2010. Oxaliplatin was held with cycles number 7 through 10 and resumed with cycle #11. 1. History of neutropenia secondary to chemotherapy. 2. History of thrombocytopenia secondary chemotherapy. 3. He did not undergo a preoperative colonoscopy. He underwent a colonoscopy on 12/19/2010 with findings of a 1 cm polyp at 90 cm from the anal verge, minimal polyp at 30 cm from the anal verge and minimal diverticulosis. Colonoscopy 03/18/2012-negative. 4. 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 He began study drug on 05/13/2010. 5. Status post Port-A-Cath placement. The Port-A-Cath has been removed. 6. 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 8. Low attenuation lesion in the caudate lobe of the liver on the CT 04/11/2012-? Cyst. MRI liver on 04/20/2012 favored the caudate lobe liver lesion to represent a cyst or complex cyst. CT abdomen/pelvis 04/17/2013 with continued enlargement of hypovascular lesion in the caudate lobe of the liver.  PET scan 10/27/2011 confirmed a hypermetabolic caudate lobe liver lesion, tiny indeterminate lung nodules, and a 9 mm level II left neck node   CT biopsy of the liver lesion 11/07/2013 confirmed  adenocarcinoma  Left liver and caudate resection 01/15/2014-pathology confirmed metastatic colon cancer, and negative surgical margins  Surveillance CT scans 07/24/2014 consistent with recurrent disease involving upper abdominal lymph nodes, peritoneal implants, and an anterior abdominal wall mass  Status post biopsy of the anterior abdominal wall mass 08/06/2014 with pathology showing metastatic adenocarcinoma consistent with a colon primary.  UGT1A1 1/28  Cycle 1 FOLFIRI/Avastin on the [facility] 1317 08/20/2014  Cycle 2 FOLFIRI/Avastin 09/03/2014  Cycle 3 held on 09/17/2014 due to neutropenia  Cycle 3 FOLFIRI/Avastin 09/25/2014. Neulasta added beginning with cycle 3.  Cycle 4 FOLFIRI/Avastin 10/08/2014  Restaging CT scans 10/18/2014 with slight interval increase in the size of the soft tissue mass in the subcutaneous fat of the epigastric region. Underlying peritoneal implants, probable focus of residual disease along the resected margin of the liver and hepatoduodenal ligament lymphadenopathy all appeared slightly improved. No new sites of metastatic disease otherwise noted. New left lower lobe bronchopneumonia.  Cycle 5 FOLFIRI/Avastin 10/22/2014  Cycle 6 FOLFIRI/Avastin 11/06/2014  Cycle 7 FOLFIRI Avastin 11/19/2014  Cycle 8 FOLFIRI/Avastin 12/03/2014  CT 12/14/2014 with stable disease  Cycle 9 FOLFIRI/Avastin 12/17/2014  Cycle 10 FOLFIRI/Avastin 01/07/2015 (5-fluorouracil and leucovorin dose reduced)  Cycle 11 FOLFIRI/Avastin 01/21/2015  Cycle 12 FOLFIRI/Avastin 02/04/2015  CT 02/13/2015 with stable disease  Cycle 13 FOLFIRI/Avastin 02/25/2015-changed to an every three-week protocol, now off study  Avastin held with cycle 14 FOLFIRI on 03/25/2015 secondary to gingivitis  Restaging CTs 05/20/2015-slight increase in the  size of the epigastric subcutaneous mass, stable peritoneal implants  FOLFIRI continued on a 3 week schedule  Avastin resumed 06/17/2015  CTs  12/23/2015-stable subxiphoid mass, stable low-attenuation mass adjacent to the left liver surgical margin, peritoneal lesions-stable  FOLFIRI continued on a 4 week schedule.  Avastin placed on hold 01/06/2016  FOLFIRI discontinued 03/02/2016 secondary to enlargement of the abdominal wall mass with new superficial fungating nodules involving the skin  Palliative radiation to the abdominal wall mass completed 03/20/2016  Cycle 1 FOLFOX12/27/2017  Cycle 2 CAPOX 06/22/2016  Clinical progression 07/07/2016 and allergic reaction to oxaliplatin 06/23/2015, CAPOX discontinued  Resection of the main abdominal wall mass and an adjacent peritoneal mass on 07/23/2016 with the pathology confirming metastatic colon cancer  Restaging CTs at [facility] 09/18/2016-new liver metastasis, progressive soft tissue masses at the liver resection margin, new mass adjacent to the chest wall resection site, increase in size of peritoneal nodules  CT abdomen/pelvis 10/02/2016-interval progression of periportal adenopathy and recurrence along the hepatic resection margin. New hepatic program lesion superior right hepatic lobe. Interval increase in size of peritoneal implants left upper quadrant. Enlargement of anabdominal wall mass along the right rectus muscle.  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 This stricture was stented with a 6cm long, uncovered 70m diameter metal bile duct stent in good position.  10. Iron deposition within the liver noted on MRI 04/20/2012. The ferritin level was normal on 04/17/2013. Increased iron deposition noted on the left liver resection 01/15/2014 11. Pain/bleeding at the anus reported when here 04/24/2013. Status post evaluation by Dr. NLucia Gaskinswith findings of an anal fissure. 12. Right face cystic lesion 13. Status post Port-A-Cath placement 08/14/2014 14. Delayed nausea following FOLFIRI,  prophylactic Decadron added with cycle 2 with improvement. 15. Chest CT 10/18/2014 with new left lower lobe bronchopneumonia. Levaquin initiated. Resolved on CT 12/14/2014 16. Gum pain-mucositis?, Gingivitis?-Improved with discontinuation of Avastin 17. Pain/tenderness, mild erythema and full appearance over the maxillary sinuses. Maxillofacial CT 01/30/2015 with mild to moderate bilateral maxillary and sphenoid sinus inflammatory changes. Multifocal right maxillary dental periapical lucency. Status post a right upper tooth extraction with resolution of the pain. 18. Admission 10/21/2016 with nausea/vomiting-x-ray evidence for a partial small bowel obstruction, NG tube in place-clinical and x-ray improvement over the past 24 hours.  The obstructive symptoms have improved and an x-ray today reveals no evidence of a small bowel obstruction. The NG tube is now clamped.  The plan is to remove the NG tube and advanced to a liquid diet as tolerated. Hopefully he will be ready for discharge within the next 1-2 days.     LOS: 2 days   SBetsy Coder MD   10/23/2016, 1:33 PM

## 2016-10-23 NOTE — Progress Notes (Signed)
Checked for residuals on NGT. 10-33ml. NGT remained clamped per order/request.

## 2016-10-23 NOTE — Progress Notes (Signed)
[facility] Progress Note     Subjective: CC: SBO  Mild lower abdominal pain present. +flatus and had 2 BMs. Has had <10 cc NGT output since 0700 today. Ambulating.  Objective: Vital signs in last 24 hours: Temp:  [97.9 F (36.6 C)-98.5 F (36.9 C)] 97.9 F (36.6 C) (05/18 0508) Pulse Rate:  [79-87] 87 (05/18 0859) Resp:  [18-20] 20 (05/18 0508) BP: (127-146)/(73-89) 146/73 (05/18 0859) SpO2:  [99 %-100 %] 100 % (05/18 0508) Last BM Date: 10/22/16  Intake/Output from previous day: 05/17 0701 - 05/18 0700 In: 1268.8 [I.V.:1268.8] Out: 1300 [Urine:300; Emesis/NG output:1000] Intake/Output this shift: Total I/O In: -  Out: 300 [Urine:300]  PE: Gen:  Alert, NAD, pleasant Card:  Regular rate and rhythm, pedal pulses 2+ BL Pulm:  Normal effort, clear to auscultation bilaterally Abd: Soft, non-tender, mild distention, bowel sounds present in all 4 quadrants, previous surgical scar noted.  NGT: 1,000 mL/24 h, <10 cc today.  Skin: warm and dry, no rashes  Psych: A&Ox3   Lab Results:   Recent Labs  10/21/16 1115  WBC 8.1  HGB 13.9  HCT 40.8  PLT 205   BMET  Recent Labs  10/21/16 1115 10/22/16 0601  NA 138 137  K 4.1 3.7  CL  --  102  CO2 26 25  GLUCOSE 128 88  BUN 16.5 20  CREATININE 1.0 0.92  CALCIUM 9.8 8.7*   PT/INR No results for input(s): LABPROT, INR in the last 72 hours. CMP     Component Value Date/Time   NA 137 10/22/2016 0601   NA 138 10/21/2016 1115   K 3.7 10/22/2016 0601   K 4.1 10/21/2016 1115   CL 102 10/22/2016 0601   CL 103 04/11/2012 1257   CO2 25 10/22/2016 0601   CO2 26 10/21/2016 1115   GLUCOSE 88 10/22/2016 0601   GLUCOSE 128 10/21/2016 1115   GLUCOSE 89 04/11/2012 1257   BUN 20 10/22/2016 0601   BUN 16.5 10/21/2016 1115   CREATININE 0.92 10/22/2016 0601   CREATININE 1.0 10/21/2016 1115   CALCIUM 8.7 (L) 10/22/2016 0601   CALCIUM 9.8 10/21/2016 1115   PROT 7.8 10/21/2016 1115   ALBUMIN 3.8 10/21/2016 1115    AST 39 (H) 10/21/2016 1115   ALT 50 10/21/2016 1115   ALKPHOS 117 10/21/2016 1115   BILITOT 0.96 10/21/2016 1115   GFRNONAA >60 10/22/2016 0601   GFRAA >60 10/22/2016 0601   Lipase     Component Value Date/Time   LIPASE 16.0 04/01/2010 0922       Studies/Results: Dg Abd 2 Views  Result Date: 10/23/2016 CLINICAL DATA:  Small bowel obstruction. EXAM: ABDOMEN - 2 VIEW COMPARISON:  Radiographs of Oct 22, 2016. FINDINGS: Distal tip of nasogastric tube seen in proximal stomach. Residual contrast is seen in the colon. No abnormal bowel dilatation is noted. Biliary stent is seen in right upper quadrant. IMPRESSION: No definite evidence of bowel obstruction or ileus. Electronically Signed   By: Marijo Conception, M.D.   On: 10/23/2016 08:51   Dg Abd 2 Views  Result Date: 10/21/2016 CLINICAL DATA:  History of colonic malignancy, liver resection with stent placement. Now with severe abdominal pain and nausea and vomiting for the past 2 weeks. EXAM: ABDOMEN - 2 VIEW COMPARISON:  Abdominal series of Oct 19 2016 FINDINGS: Increased gaseous and fluid distention of small-bowel loops is noted throughout the abdomen. There is some gas and fluid in the transverse and proximal descending portions of the  colon without abnormal distention. No free extraluminal gas collections are observed. There is no significant rectal gas. A biliary stent is present on the right. A radiodense structure projects over the right upper quadrant as well and is not new. There are numerous surgical clips in the right upper quadrant medially. IMPRESSION: Findings compatible with an ileus or partial distal small bowel obstruction. There is no evidence of perforation. Electronically Signed   By: David  Martinique M.D.   On: 10/21/2016 12:54   Dg Abd Portable 1v-small Bowel Obstruction Protocol-initial, 8 Hr Delay  Result Date: 10/22/2016 CLINICAL DATA:  Small bowel obstruction EXAM: PORTABLE ABDOMEN - 1 VIEW COMPARISON:  Oct 21, 2016  FINDINGS: There is minimal small bowel dilatation without air-fluid levels. No free air. There are postoperative changes in the upper abdomen. There is a stent in the biliary region. He amorphous calcification in the right upper quadrant. Nasogastric tube tip and side port in stomach. IMPRESSION: Mild residual small bowel dilatation without air-fluid levels. Suspect resolving obstruction. No free air. Electronically Signed   By: Lowella Grip III M.D.   On: 10/22/2016 08:42   Dg Abd Portable 1v  Result Date: 10/21/2016 CLINICAL DATA:  Repositioning of NG tube EXAM: PORTABLE ABDOMEN - 1 VIEW COMPARISON:  10/21/2016 FINDINGS: Esophageal tube tip and side-port overlies the proximal to mid stomach. Biliary stent. Oval density in the right upper quadrant as before. Postsurgical changes in the epigastric. No change in diffusely dilated small and large bowel. IMPRESSION: 1. Esophageal tube tip and side port overlie the proximal to mid stomach 2. No change in diffuse gaseous dilatation of small bowel with scattered colon gas suggesting partial bowel obstruction or ileus Electronically Signed   By: Donavan Foil M.D.   On: 10/21/2016 21:24   Dg Abd Portable 1v-small Bowel Protocol-position Verification  Result Date: 10/21/2016 CLINICAL DATA:  NG tube placement EXAM: PORTABLE ABDOMEN - 1 VIEW COMPARISON:  10/21/2016, CT 10/02/2016 FINDINGS: Mild atelectasis at the left base. Esophageal tube tip overlies the GE junction, side-port projects over distal esophagus, further advancement by approximately 10 cm is suggested for more optimal positioning. Surgical clips in the epigastrium. Biliary stent. Stable oval radiopacity in the right upper quadrant. Continued gaseous dilatation of small and large bowel with small bowel measuring up to 4.6 cm. Surgical suture in the right lower abdomen. IMPRESSION: 1. Esophageal tube tip overlies the GE junction, the side-port overlies the distal esophagus, further advancement is  recommended for more optimal positioning 2. Continued gaseous dilatation of small and large bowel which may be secondary to ileus or partial obstruction These results will be called to the ordering clinician or representative by the Radiologist Assistant, and communication documented in the PACS or zVision Dashboard. Electronically Signed   By: Donavan Foil M.D.   On: 10/21/2016 20:36    Anti-infectives: Anti-infectives    None     Assessment/Plan Small bowel obstruction secondary to adhesions/tumor Metastatic adenocarcinoma of the colon    AXR improved, no small bowel dilatation or air fluid levels     Bowel function returned - having flatus and BMs    Clamp NGT. Continue NPO. If tolerates clamping trial can d/c NGT and start clears.     Continue to mobilize   LOS: 2 days    Bryan Fields , [facility] Surgery 10/23/2016, 11:33 AM Pager: [phone] Consults: [phone] Mon-Fri 7:00 am-4:30 pm Sat-Sun 7:00 am-11:30 am

## 2016-10-23 NOTE — Progress Notes (Signed)
Ambulated several times this shift,tolerated activity.NGT clamped earlier per order, denies n/v. Will continue to monitor.

## 2016-10-23 NOTE — Progress Notes (Signed)
Endorsed to [employer]

## 2016-10-24 ENCOUNTER — Inpatient Hospital Stay (HOSPITAL_COMMUNITY): Payer: 59

## 2016-10-24 DIAGNOSIS — R1310 Dysphagia, unspecified: Secondary | ICD-10-CM

## 2016-10-24 MED ORDER — HYDROMORPHONE HCL 1 MG/ML IJ SOLN
0.5000 mg | INTRAMUSCULAR | Status: DC | PRN
  Administered 2016-10-24 – 2016-10-26 (×9): 1 mg via INTRAVENOUS
  Filled 2016-10-24 (×11): qty 1

## 2016-10-24 NOTE — Progress Notes (Signed)
   Subjective/Chief Complaint: His main complaint is that he is having trouble swallowing. Thinks it is irritation from ng. Had 2 bm's yesterday   Objective: Vital signs in last 24 hours: Temp:  [98 F (36.7 C)-99 F (37.2 C)] 98 F (36.7 C) (05/19 0518) Pulse Rate:  [69-87] 69 (05/19 0518) Resp:  [16-18] 16 (05/19 0518) BP: (129-152)/(73-83) 129/78 (05/19 0518) SpO2:  [98 %-100 %] 98 % (05/19 0518) Last BM Date: 10/22/16  Intake/Output from previous day: 05/18 0701 - 05/19 0700 In: 2500 [I.V.:2500] Out: 450 [Urine:300; Emesis/NG output:150] Intake/Output this shift: Total I/O In: 1500 [I.V.:1500] Out: 50 [Emesis/NG output:50]  General appearance: alert and cooperative Resp: clear to auscultation bilaterally Cardio: regular rate and rhythm GI: soft, nontender. good bs  Lab Results:   Recent Labs  10/21/16 1115  WBC 8.1  HGB 13.9  HCT 40.8  PLT 205   BMET  Recent Labs  10/21/16 1115 10/22/16 0601  NA 138 137  K 4.1 3.7  CL  --  102  CO2 26 25  GLUCOSE 128 88  BUN 16.5 20  CREATININE 1.0 0.92  CALCIUM 9.8 8.7*   PT/INR No results for input(s): LABPROT, INR in the last 72 hours. ABG No results for input(s): PHART, HCO3 in the last 72 hours.  Invalid input(s): PCO2, PO2  Studies/Results: Dg Abd 2 Views  Result Date: 10/23/2016 CLINICAL DATA:  Small bowel obstruction. EXAM: ABDOMEN - 2 VIEW COMPARISON:  Radiographs of Oct 22, 2016. FINDINGS: Distal tip of nasogastric tube seen in proximal stomach. Residual contrast is seen in the colon. No abnormal bowel dilatation is noted. Biliary stent is seen in right upper quadrant. IMPRESSION: No definite evidence of bowel obstruction or ileus. Electronically Signed   By: Marijo Conception, M.D.   On: 10/23/2016 08:51   Dg Abd Portable 1v-small Bowel Obstruction Protocol-initial, 8 Hr Delay  Result Date: 10/22/2016 CLINICAL DATA:  Small bowel obstruction EXAM: PORTABLE ABDOMEN - 1 VIEW COMPARISON:  Oct 21, 2016  FINDINGS: There is minimal small bowel dilatation without air-fluid levels. No free air. There are postoperative changes in the upper abdomen. There is a stent in the biliary region. He amorphous calcification in the right upper quadrant. Nasogastric tube tip and side port in stomach. IMPRESSION: Mild residual small bowel dilatation without air-fluid levels. Suspect resolving obstruction. No free air. Electronically Signed   By: Lowella Grip III M.D.   On: 10/22/2016 08:42    Anti-infectives: Anti-infectives    None      Assessment/Plan: s/p * No surgery found * sbo seems to have resolved.  Will stay with clears today and see if swallowing issue resolves. If not then he may need a swallow eval ambulate  LOS: 3 days    TOTH III,Livvy Spilman S 10/24/2016

## 2016-10-24 NOTE — Progress Notes (Signed)
IP PROGRESS NOTE  Subjective:   He had multiple bowel movements chest or day. He has been ambulating in the hall. The NG tube was removed last night. He complains of soreness in the throat and difficulty swallowing. He has difficulty breathing when supine. The back pain has improved. He continues to have lower abdominal pain. Objective: Vital signs in last 24 hours: Blood pressure 129/78, pulse 69, temperature 98 F (36.7 C), temperature source Oral, resp. rate 16, height 5\' 7"  (1.702 m), weight 249 lb (112.9 kg), SpO2 98 %.  Intake/Output from previous day: 05/18 0701 - 05/19 0700 In: 2500 [I.V.:2500] Out: 450 [Urine:300; Emesis/NG output:150]  Physical Exam:  HEENT: No thrush. Pharynx without erythema or exudate. Lungs: Clear bilaterally Cardiac: Regular rate and rhythm Abdomen: Distended, active bowel sounds Extremities: No leg edema   Portacath/PICC-without erythema  Lab Results:  Recent Labs  10/21/16 1115  WBC 8.1  HGB 13.9  HCT 40.8  PLT 205    BMET  Recent Labs  10/21/16 1115 10/22/16 0601  NA 138 137  K 4.1 3.7  CL  --  102  CO2 26 25  GLUCOSE 128 88  BUN 16.5 20  CREATININE 1.0 0.92  CALCIUM 9.8 8.7*    Studies/Results: Dg Abd 2 Views  Result Date: 10/23/2016 CLINICAL DATA:  Small bowel obstruction. EXAM: ABDOMEN - 2 VIEW COMPARISON:  Radiographs of Oct 22, 2016. FINDINGS: Distal tip of nasogastric tube seen in proximal stomach. Residual contrast is seen in the colon. No abnormal bowel dilatation is noted. Biliary stent is seen in right upper quadrant. IMPRESSION: No definite evidence of bowel obstruction or ileus. Electronically Signed   By: Oct 24, 2016, M.D.   On: 10/23/2016 08:51   Dg Abd Portable 1v  Result Date: 10/24/2016 CLINICAL DATA:  Small bowel obstruction. EXAM: PORTABLE ABDOMEN - 1 VIEW COMPARISON:  Abdominal radiographs 10/23/2016 FINDINGS: A single dilated loop of small bowel is present on the left side of the abdomen, new from  yesterday. No definite free air is present. Contrast is again seen throughout colon. A biliary stent is in place. IMPRESSION: 1. Subtle dilated loop of small bowel in the left side of the abdomen. 2. No other evidence for small bowel obstruction. Electronically Signed   By: 10/25/2016 M.D.   On: 10/24/2016 07:23   Dg Abd Portable 1v-small Bowel Obstruction Protocol-initial, 8 Hr Delay  Result Date: 10/22/2016 CLINICAL DATA:  Small bowel obstruction EXAM: PORTABLE ABDOMEN - 1 VIEW COMPARISON:  Oct 21, 2016 FINDINGS: There is minimal small bowel dilatation without air-fluid levels. No free air. There are postoperative changes in the upper abdomen. There is a stent in the biliary region. He amorphous calcification in the right upper quadrant. Nasogastric tube tip and side port in stomach. IMPRESSION: Mild residual small bowel dilatation without air-fluid levels. Suspect resolving obstruction. No free air. Electronically Signed   By: Oct 23, 2016 III M.D.   On: 10/22/2016 08:42    Medications: I have reviewed the patient's current medications.  Assessment/Plan: 1. 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 Microsatellite stable; preserved expression of major and minor MMR proteins. He began treatment with FOLFOX in December 2011. He completed cycle #12 on 10/27/2010. Oxaliplatin was held with cycles number 7 through 10 and resumed with cycle #11. 1. History of neutropenia secondary to chemotherapy. 2. History of thrombocytopenia secondary chemotherapy. 3. He  did not undergo a preoperative colonoscopy. He underwent a colonoscopy on 12/19/2010 with findings of a 1 cm polyp at 90 cm from the anal verge, minimal polyp at 30 cm from the anal verge and minimal diverticulosis. Colonoscopy 03/18/2012-negative. 4. 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 He began study  drug on 05/13/2010. 5. Status post Port-A-Cath placement. The Port-A-Cath has been removed. 6. 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 8. Low attenuation lesion in the caudate lobe of the liver on the CT 04/11/2012-? Cyst. MRI liver on 04/20/2012 favored the caudate lobe liver lesion to represent a cyst or complex cyst. CT abdomen/pelvis 04/17/2013 with continued enlargement of hypovascular lesion in the caudate lobe of the liver.  PET scan 10/27/2011 confirmed a hypermetabolic caudate lobe liver lesion, tiny indeterminate lung nodules, and a 9 mm level II left neck node   CT biopsy of the liver lesion 11/07/2013 confirmed adenocarcinoma  Left liver and caudate resection 01/15/2014-pathology confirmed metastatic colon cancer, and negative surgical margins  Surveillance CT scans 07/24/2014 consistent with recurrent disease involving upper abdominal lymph nodes, peritoneal implants, and an anterior abdominal wall mass  Status post biopsy of the anterior abdominal wall mass 08/06/2014 with pathology showing metastatic adenocarcinoma consistent with a colon primary.  [id number] 1/28  Cycle 1 FOLFIRI/Avastin on the [facility] 1317 08/20/2014  Cycle 2 FOLFIRI/Avastin 09/03/2014  Cycle 3 held on 09/17/2014 due to neutropenia  Cycle 3 FOLFIRI/Avastin 09/25/2014. Neulasta added beginning with cycle 3.  Cycle 4 FOLFIRI/Avastin 10/08/2014  Restaging CT scans 10/18/2014 with slight interval increase in the size of the soft tissue mass in the subcutaneous fat of the epigastric region. Underlying peritoneal implants, probable focus of residual disease along the resected margin of the liver and hepatoduodenal ligament lymphadenopathy all appeared slightly improved. No new sites of metastatic disease otherwise noted. New left lower lobe  bronchopneumonia.  Cycle 5 FOLFIRI/Avastin 10/22/2014  Cycle 6 FOLFIRI/Avastin 11/06/2014  Cycle 7 FOLFIRI Avastin 11/19/2014  Cycle 8 FOLFIRI/Avastin 12/03/2014  CT 12/14/2014 with stable disease  Cycle 9 FOLFIRI/Avastin 12/17/2014  Cycle 10 FOLFIRI/Avastin 01/07/2015 (5-fluorouracil and leucovorin dose reduced)  Cycle 11 FOLFIRI/Avastin 01/21/2015  Cycle 12 FOLFIRI/Avastin 02/04/2015  CT 02/13/2015 with stable disease  Cycle 13 FOLFIRI/Avastin 02/25/2015-changed to an every three-week protocol, now off study  Avastin held with cycle 14 FOLFIRI on 03/25/2015 secondary to gingivitis  Restaging CTs 05/20/2015-slight increase in the size of the epigastric subcutaneous mass, stable peritoneal implants  FOLFIRI continued on a 3 week schedule  Avastin resumed 06/17/2015  CTs 12/23/2015-stable subxiphoid mass, stable low-attenuation mass adjacent to the left liver surgical margin, peritoneal lesions-stable  FOLFIRI continued on a 4 week schedule.  Avastin placed on hold 01/06/2016  FOLFIRI discontinued 03/02/2016 secondary to enlargement of the abdominal wall mass with new superficial fungating nodules involving the skin  Palliative radiation to the abdominal wall mass completed 03/20/2016  Cycle 1 FOLFOX12/27/2017  Cycle 2 CAPOX 06/22/2016  Clinical progression 07/07/2016 and allergic reaction to oxaliplatin 06/23/2015, CAPOX discontinued  Resection of the main abdominal wall mass and an adjacent peritoneal mass on 07/23/2016 with the pathology confirming metastatic colon cancer  [facility] at [facility] 09/18/2016-new liver metastasis, progressive soft tissue masses at the liver resection margin, new mass adjacent to the chest wall resection site, increase in size of peritoneal nodules  CT abdomen/pelvis 10/02/2016-interval progression of periportal adenopathy and recurrence along the hepatic resection margin. New  hepatic program lesion superior right hepatic lobe.  Interval increase in size of peritoneal implants left upper quadrant. Enlargement of anabdominal wall mass along the right rectus muscle.  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 This stricture was stented with a 6cm long, uncovered 25m diameter metal bile duct stent in good position.  10. Iron deposition within the liver noted on MRI 04/20/2012. The ferritin level was normal on 04/17/2013. Increased iron deposition noted on the left liver resection 01/15/2014 11. Pain/bleeding at the anus reported when here 04/24/2013. Status post evaluation by Dr. NLucia Gaskinswith findings of an anal fissure. 12. Right face cystic lesion 13. Status post Port-A-Cath placement 08/14/2014 14. Delayed nausea following FOLFIRI, prophylactic Decadron added with cycle 2 with improvement. 15. Chest CT 10/18/2014 with new left lower lobe bronchopneumonia. Levaquin initiated. Resolved on CT 12/14/2014 16. Gum pain-mucositis?, Gingivitis?-Improved with discontinuation of Avastin 17. Pain/tenderness, mild erythema and full appearance over the maxillary sinuses. Maxillofacial CT 01/30/2015 with mild to moderate bilateral maxillary and sphenoid sinus inflammatory changes. Multifocal right maxillary dental periapical lucency. Status post a right upper tooth extraction with resolution of the pain. 18. Admission 10/21/2016 with nausea/vomiting-x-ray evidence for a partial small bowel obstruction, NG tube placed-clinical and x-ray improvement over the past 2 days, in NG-tube removed in the p.m. 10/23/2016  The NG tube was removed last night. He is having bowel movements. We will follow-up on the abdominal x-ray from this morning. He complains of difficulty swallowing and a choking sensation when supine. If the symptoms do not improve we will check a swallow evaluation.  He will remain hospitalized until he is able to take liquids by  mouth.    LOS: 3 days   SBetsy Coder MD   10/24/2016, 7:57 AM

## 2016-10-25 DIAGNOSIS — R0602 Shortness of breath: Secondary | ICD-10-CM

## 2016-10-25 DIAGNOSIS — R740 Nonspecific elevation of levels of transaminase and lactic acid dehydrogenase [LDH]: Secondary | ICD-10-CM

## 2016-10-25 DIAGNOSIS — K56609 Unspecified intestinal obstruction, unspecified as to partial versus complete obstruction: Secondary | ICD-10-CM

## 2016-10-25 DIAGNOSIS — C189 Malignant neoplasm of colon, unspecified: Secondary | ICD-10-CM

## 2016-10-25 MED ORDER — BOOST / RESOURCE BREEZE PO LIQD
1.0000 | Freq: Three times a day (TID) | ORAL | Status: DC
  Administered 2016-10-25 (×2): 1 via ORAL

## 2016-10-25 NOTE — Progress Notes (Addendum)
Bryan Fields   DOB:May 30, 1961   QZ#:[id number]   FHL#:[id number]  Oncology follow up   Subjective: I am covering Dr. Benay Spice today to see pt. He is doing better, tolerating liquid diet, no nausea, had BM yesterday. VS stable.   Objective:  Vitals:   10/24/16 2024 10/25/16 0524  BP: 128/65 (!) 141/88  Pulse: 67 60  Resp: 20 16  Temp: 97.4 F (36.3 C) 98.3 F (36.8 C)    Body mass index is 39 kg/m.  Intake/Output Summary (Last 24 hours) at 10/25/16 1529 Last data filed at 10/24/16 1912  Gross per 24 hour  Intake              360 ml  Output                0 ml  Net              360 ml     Sclerae unicteric  Oropharynx clear  No peripheral adenopathy  Lungs clear -- no rales or rhonchi  Heart regular rate and rhythm  Abdomen benign  MSK no focal spinal tenderness, no peripheral edema  Neuro nonfocal   CBG (last 3)  No results for input(s): GLUCAP in the last 72 hours.   Labs:  Lab Results  Component Value Date   WBC 8.1 10/21/2016   HGB 13.9 10/21/2016   HCT 40.8 10/21/2016   MCV 97.5 10/21/2016   PLT 205 10/21/2016   NEUTROABS 6.6 (H) 10/21/2016   CMP Latest Ref Rng & Units 10/22/2016 10/21/2016 10/15/2016  Glucose 65 - 99 mg/dL 88 128 -  BUN 6 - 20 mg/dL 20 16.5 -  Creatinine 0.61 - 1.24 mg/dL 0.92 1.0 -  Sodium 135 - 145 mmol/L 137 138 -  Potassium 3.5 - 5.1 mmol/L 3.7 4.1 -  Chloride 101 - 111 mmol/L 102 - -  CO2 22 - 32 mmol/L 25 26 -  Calcium 8.9 - 10.3 mg/dL 8.7(L) 9.8 -  Total Protein 6.4 - 8.3 g/dL - 7.8 8.1  Total Bilirubin 0.20 - 1.20 mg/dL - 0.96 0.8  Alkaline Phos 40 - 150 U/L - 117 145(H)  AST 5 - 34 U/L - 39(H) 48(H)  ALT 0 - 55 U/L - 50 111(H)     Urine Studies No results for input(s): UHGB, CRYS in the last 72 hours.  Invalid input(s): UACOL, UAPR, USPG, UPH, UTP, UGL, UKET, UBIL, UNIT, UROB, ULEU, UEPI, UWBC, URBC, UBAC, CAST, UCOM, BILUA  Basic Metabolic Panel:  Recent Labs Lab 10/21/16 1115 10/22/16 0601  NA 138 137  K 4.1  3.7  CL  --  102  CO2 26 25  GLUCOSE 128 88  BUN 16.5 20  CREATININE 1.0 0.92  CALCIUM 9.8 8.7*   GFR Estimated Creatinine Clearance: 105 mL/min (by C-G formula based on SCr of 0.92 mg/dL). Liver Function Tests:  Recent Labs Lab 10/21/16 1115  AST 39*  ALT 50  ALKPHOS 117  BILITOT 0.96  PROT 7.8  ALBUMIN 3.8   No results for input(s): LIPASE, AMYLASE in the last 168 hours. No results for input(s): AMMONIA in the last 168 hours. Coagulation profile No results for input(s): INR, PROTIME in the last 168 hours.  CBC:  Recent Labs Lab 10/21/16 1115  WBC 8.1  NEUTROABS 6.6*  HGB 13.9  HCT 40.8  MCV 97.5  PLT 205   Cardiac Enzymes: No results for input(s): CKTOTAL, CKMB, CKMBINDEX, TROPONINI in the last 168 hours. BNP: Invalid input(s): POCBNP  CBG: No results for input(s): GLUCAP in the last 168 hours. D-Dimer No results for input(s): DDIMER in the last 72 hours. Hgb A1c No results for input(s): HGBA1C in the last 72 hours. Lipid Profile No results for input(s): CHOL, HDL, LDLCALC, TRIG, CHOLHDL, LDLDIRECT in the last 72 hours. Thyroid function studies No results for input(s): TSH, T4TOTAL, T3FREE, THYROIDAB in the last 72 hours.  Invalid input(s): FREET3 Anemia work up No results for input(s): VITAMINB12, FOLATE, FERRITIN, TIBC, IRON, RETICCTPCT in the last 72 hours. Microbiology No results found for this or any previous visit (from the past 240 hour(s)).    Studies:  Dg Abd Portable 1v  Result Date: 10/24/2016 CLINICAL DATA:  Small bowel obstruction. EXAM: PORTABLE ABDOMEN - 1 VIEW COMPARISON:  Abdominal radiographs 10/23/2016 FINDINGS: A single dilated loop of small bowel is present on the left side of the abdomen, new from yesterday. No definite free air is present. Contrast is again seen throughout colon. A biliary stent is in place. IMPRESSION: 1. Subtle dilated loop of small bowel in the left side of the abdomen. 2. No other evidence for small bowel  obstruction. Electronically Signed   By: San Morelle M.D.   On: 10/24/2016 07:23    Assessment: 56 y.o.  1. SBO secondary to #2, resolving  2. Metastatic colon cancer 3. Transaminitis secondary to liver metastasis  Plan:  -His SBO is resolving, his diet is advanced to full liquid with boost, I encourage him to take nutritional supplement  decrease IVF to 31ml/hr  -I encourage him to ambulate in hallway  -possible discharge home tomorrow  -will repeat his CMP tomorrow norming  -Dr. Benay Spice will resume care tomorrow    Truitt Merle, MD 10/25/2016  3:29 PM

## 2016-10-25 NOTE — Progress Notes (Signed)
   Subjective/Chief Complaint: Able to swallow better. Still has some discomfort in throat   Objective: Vital signs in last 24 hours: Temp:  [97.4 F (36.3 C)-98.3 F (36.8 C)] 98.3 F (36.8 C) (05/20 0524) Pulse Rate:  [60-67] 60 (05/20 0524) Resp:  [16-20] 16 (05/20 0524) BP: (128-142)/(65-88) 141/88 (05/20 0524) SpO2:  [100 %] 100 % (05/20 0524) Last BM Date: 10/24/16  Intake/Output from previous day: 05/19 0701 - 05/20 0700 In: 960 [P.O.:960] Out: -  Intake/Output this shift: No intake/output data recorded.  General appearance: alert and cooperative Resp: clear to auscultation bilaterally Cardio: regular rate and rhythm GI: soft, nontender. good bs  Lab Results:  No results for input(s): WBC, HGB, HCT, PLT in the last 72 hours. BMET No results for input(s): NA, K, CL, CO2, GLUCOSE, BUN, CREATININE, CALCIUM in the last 72 hours. PT/INR No results for input(s): LABPROT, INR in the last 72 hours. ABG No results for input(s): PHART, HCO3 in the last 72 hours.  Invalid input(s): PCO2, PO2  Studies/Results: Dg Abd 2 Views  Result Date: 10/23/2016 CLINICAL DATA:  Small bowel obstruction. EXAM: ABDOMEN - 2 VIEW COMPARISON:  Radiographs of Oct 22, 2016. FINDINGS: Distal tip of nasogastric tube seen in proximal stomach. Residual contrast is seen in the colon. No abnormal bowel dilatation is noted. Biliary stent is seen in right upper quadrant. IMPRESSION: No definite evidence of bowel obstruction or ileus. Electronically Signed   By: Marijo Conception, M.D.   On: 10/23/2016 08:51   Dg Abd Portable 1v  Result Date: 10/24/2016 CLINICAL DATA:  Small bowel obstruction. EXAM: PORTABLE ABDOMEN - 1 VIEW COMPARISON:  Abdominal radiographs 10/23/2016 FINDINGS: A single dilated loop of small bowel is present on the left side of the abdomen, new from yesterday. No definite free air is present. Contrast is again seen throughout colon. A biliary stent is in place. IMPRESSION: 1. Subtle  dilated loop of small bowel in the left side of the abdomen. 2. No other evidence for small bowel obstruction. Electronically Signed   By: San Morelle M.D.   On: 10/24/2016 07:23    Anti-infectives: Anti-infectives    None      Assessment/Plan: s/p * No surgery found * Advance diet. Start fulls and add boost Had teeth removed so can only take soft and liquid foods Ambulate If he continues to do well then maybe home soon  LOS: 4 days    TOTH III,Frayda Egley S 10/25/2016

## 2016-10-26 ENCOUNTER — Other Ambulatory Visit: Payer: Self-pay | Admitting: *Deleted

## 2016-10-26 LAB — COMPREHENSIVE METABOLIC PANEL
ALBUMIN: 3.4 g/dL — AB (ref 3.5–5.0)
ALT: 41 U/L (ref 17–63)
ANION GAP: 10 (ref 5–15)
AST: 39 U/L (ref 15–41)
Alkaline Phosphatase: 82 U/L (ref 38–126)
BILIRUBIN TOTAL: 1.2 mg/dL (ref 0.3–1.2)
BUN: 6 mg/dL (ref 6–20)
CALCIUM: 8.8 mg/dL — AB (ref 8.9–10.3)
CO2: 26 mmol/L (ref 22–32)
Chloride: 101 mmol/L (ref 101–111)
Creatinine, Ser: 0.82 mg/dL (ref 0.61–1.24)
GFR calc Af Amer: >60 mL/min (ref >60)
GLUCOSE: 87 mg/dL (ref 65–99)
POTASSIUM: 3.2 mmol/L — AB (ref 3.5–5.1)
SODIUM: 137 mmol/L (ref 135–145)
TOTAL PROTEIN: 7 g/dL (ref 6.5–8.1)

## 2016-10-26 MED ORDER — POTASSIUM CHLORIDE CRYS ER 20 MEQ PO TBCR: 20.0000 meq | EXTENDED_RELEASE_TABLET | Freq: Every day | ORAL | 0 refills | Status: DC

## 2016-10-26 MED ORDER — BOOST PLUS PO LIQD
237.0000 mL | Freq: Three times a day (TID) | ORAL | Status: DC
  Administered 2016-10-26: 237 mL via ORAL
  Filled 2016-10-26 (×2): qty 237

## 2016-10-26 MED ORDER — HEPARIN SOD (PORK) LOCK FLUSH 100 UNIT/ML IV SOLN
500.0000 [IU] | INTRAVENOUS | Status: DC | PRN
  Administered 2016-10-26: 500 [IU]
  Filled 2016-10-26: qty 5

## 2016-10-26 NOTE — Discharge Instructions (Signed)
Call for nausea or vomiting

## 2016-10-26 NOTE — Progress Notes (Signed)
   Subjective/Chief Complaint: C/o back pain, mild abdominal soreness. Still has some discomfort in throat. Tolerating diet without nausea or emesis, having bowel movements.   Objective: Vital signs in last 24 hours: Temp:  [98.4 F (36.9 C)-99.4 F (37.4 C)] 98.4 F (36.9 C) (05/21 0430) Pulse Rate:  [63-70] 67 (05/21 0430) Resp:  [16-20] 20 (05/21 0430) BP: (133-154)/(72-89) 154/89 (05/21 0430) SpO2:  [100 %] 100 % (05/21 0430) Last BM Date: 10/24/16  Intake/Output from previous day: 05/20 0701 - 05/21 0700 In: 3486.3 [P.O.:1560; I.V.:1926.3] Out: -  Intake/Output this shift: No intake/output data recorded.  General appearance: alert and cooperative Resp: clear to auscultation bilaterally Cardio: regular rate and rhythm GI: soft, nontender. good bs  Lab Results:  No results for input(s): WBC, HGB, HCT, PLT in the last 72 hours. BMET  Recent Labs  10/26/16 0435  NA 137  K 3.2*  CL 101  CO2 26  GLUCOSE 87  BUN 6  CREATININE 0.82  CALCIUM 8.8*   PT/INR No results for input(s): LABPROT, INR in the last 72 hours. ABG No results for input(s): PHART, HCO3 in the last 72 hours.  Invalid input(s): PCO2, PO2  Studies/Results: No results found.  Anti-infectives: Anti-infectives    None      Assessment/Plan: s/p * No surgery found *  Continue fulls and  boost Had teeth removed so can only take soft and liquid foods Ambulate OK for discharge from surgery standpoint  LOS: 5 days    Clovis Riley 10/26/2016

## 2016-10-26 NOTE — Progress Notes (Signed)
Discharge instructions explained to pt and his wife. KCL called in to his pharmacy. Pt discharged via wheelchair. Port discontinued after being flushed with Normal Saline 10 ml and 500 units heparin.

## 2016-10-26 NOTE — Discharge Summary (Signed)
Physician Discharge Summary  Patient ID: Bryan Fields @ATTENDINGNPI @ MRN: [id number] DOB/AGE: [birth year] 56 y.o.  Admit date: 10/21/2016 Discharge date: 10/26/2016  Discharge Diagnoses:  Active Problems:   Metastatic colon cancer to liver ([place])   SBO (small bowel obstruction) ([place])   Discharged Condition:Improved  Discharge Labs:  10/26/2016-potassium 3.2  Significant Diagnostic Studies: Abdominal x-rays  Consults: General surgery  Procedures:  None  Disposition: 01-Home or Self Care   Allergies as of 10/26/2016      Reactions   Other    Chemo therapy drug: Oxali Caused itching and redness.       Medication List    STOP taking these medications   OMEGA 3 PO   sorbitol 70 % solution     TAKE these medications   atenolol 50 MG tablet Commonly known as:  TENORMIN Take 1 tablet (50 mg total) by mouth daily.   HYDROmorphone 2 MG tablet Commonly known as:  DILAUDID Take 1-2 tablets (2-4 mg total) by mouth every 4 (four) hours as needed for severe pain.   ibuprofen 200 MG tablet Commonly known as:  ADVIL,MOTRIN Take 400 mg by mouth every 6 (six) hours as needed for mild pain.   lidocaine-prilocaine cream Commonly known as:  EMLA Apply 1 application topically as needed (apply to port).   omeprazole 40 MG capsule Commonly known as:  PRILOSEC Take 1 capsule (40 mg total) by mouth every other day.   oxyCODONE 5 MG immediate release tablet Commonly known as:  Oxy IR/ROXICODONE Take 5-10 mg by mouth every 4 (four) hours as needed for pain.   polyethylene glycol packet Commonly known as:  MIRALAX / GLYCOLAX Take 17 g by mouth daily.   potassium chloride SA 20 MEQ tablet Commonly known as:  K-DUR,KLOR-CON Take 1 tablet (20 mEq total) by mouth daily.   sildenafil 100 MG tablet Commonly known as:  VIAGRA Take 100 mg by mouth daily as needed for erectile dysfunction. Reported on 06/17/2015   tiZANidine 4 MG tablet Commonly known as:  ZANAFLEX Take 4 mg by  mouth every 6 (six) hours as needed for muscle spasms.   Vitamin B-12 2500 MCG Subl Place 1 tablet under the tongue daily.       Follow-up Information    Ladell Pier, MD Follow up.   Specialty:  Oncology Why:  Office visit 10/29/2016 at 10 AM Contact information: [place] [address] [phone]        Ladell Pier, MD .   Specialty:  Oncology Contact information: [place] [address] [phone]           Hospital Course: [name] is a 56 year old with metastatic colon cancer. He was admitted on 10/21/2016 with clinical and x-ray evidence of a small bowel obstruction. He was seen in consultation by the general surgery service. An NG tube was placed to wall suction. He was placed on bowel rest.  He had abdominal and back pain that was controlled with IV Dilaudid.  He began having bowel movements on 10/22/2016. The NG tube was clamped and then removed on 10/23/2016. He was tolerating a liquid diet at discharge. The nausea and vomiting he experienced on hospital admission had resolved. On the day of discharge the potassium returned at 3.2. He will begin a potassium supplement.  [name] appears stable for discharge on 10/26/2016. He is scheduled for a follow-up visit at the [facility] on 10/29/2016.  The upper abdominal wound continued to  be treated with a wet/dry dressing daily during this admission.       Signed: Betsy Coder, MD 10/26/2016, 12:12 PM

## 2016-10-28 ENCOUNTER — Telehealth: Payer: Self-pay | Admitting: Oncology

## 2016-10-28 NOTE — Telephone Encounter (Signed)
No los per 10/21/16 visit.

## 2016-10-29 ENCOUNTER — Ambulatory Visit (HOSPITAL_COMMUNITY)
Admission: RE | Admit: 2016-10-29 | Discharge: 2016-10-29 | Disposition: A | Discharge status: Home / Self Care | Payer: 59 | Source: Ambulatory Visit | Attending: Oncology | Admitting: Oncology

## 2016-10-29 ENCOUNTER — Ambulatory Visit (HOSPITAL_BASED_OUTPATIENT_CLINIC_OR_DEPARTMENT_OTHER): Payer: 59 | Admitting: Oncology

## 2016-10-29 ENCOUNTER — Other Ambulatory Visit: Payer: Self-pay | Admitting: *Deleted

## 2016-10-29 ENCOUNTER — Telehealth: Payer: Self-pay | Admitting: *Deleted

## 2016-10-29 VITALS — BP 118/71 | HR 73 | Temp 98.7°F | Resp 20 | Ht 67.0 in | Wt 248.6 lb

## 2016-10-29 DIAGNOSIS — C189 Malignant neoplasm of colon, unspecified: Secondary | ICD-10-CM | POA: Diagnosis present

## 2016-10-29 DIAGNOSIS — M545 Low back pain: Secondary | ICD-10-CM

## 2016-10-29 DIAGNOSIS — C182 Malignant neoplasm of ascending colon: Secondary | ICD-10-CM

## 2016-10-29 DIAGNOSIS — R109 Unspecified abdominal pain: Secondary | ICD-10-CM

## 2016-10-29 DIAGNOSIS — R194 Change in bowel habit: Secondary | ICD-10-CM | POA: Diagnosis not present

## 2016-10-29 DIAGNOSIS — C787 Secondary malignant neoplasm of liver and intrahepatic bile duct: Secondary | ICD-10-CM

## 2016-10-29 MED ORDER — HYDROMORPHONE HCL 2 MG PO TABS: 2.0000 mg | ORAL_TABLET | ORAL | 0 refills | Status: DC | PRN

## 2016-10-29 NOTE — Telephone Encounter (Signed)
Patient's wife notified per order of Dr. Benay Spice that abd xray was negative for blockage and that Dr. Benay Spice would discuss Lonsurf with pt at next appt on 11/03/16.  Patient's wife appreciative of call and states that pt is resting at this time.

## 2016-10-29 NOTE — Progress Notes (Addendum)
[facility] OFFICE PROGRESS NOTE   Diagnosis: Colon cancer  INTERVAL HISTORY:   Bryan Fields was discharged on 10/26/2016 after admission with a partial small bowel obstruction. He did not have a bowel movement on 10/26/2016 or 10/27/2016. He continues to have low abdominal and back pain. Ibuprofen helps more than Dilaudid. He had bowel movements yesterday and again today. He is following a liquid diet. No nausea or vomiting.  Objective:  Vital signs in last 24 hours:  Blood pressure 118/71, pulse 73, temperature 98.7 F (37.1 C), resp. rate 20, height '5\' 7"'$  (1.702 m), weight 248 lb 9.6 oz (112.8 kg), SpO2 100 %.    Resp: Lungs clear bilaterally Cardio: Regular rate and rhythm GI: Mildly distended, healing upper abdominal incision, no mass, no hepatosplenomegaly Vascular: No leg edema, the left lower leg is slightly larger than the right side   Portacath/PICC-without erythema   Imaging:  Dg Abd 2 Views  Result Date: 10/29/2016 CLINICAL DATA:  Abdominal pain. Rule out bowel obstruction. History of colon cancer EXAM: ABDOMEN - 2 VIEW COMPARISON:  10/24/2016 FINDINGS: Normal bowel gas pattern.  Negative for bowel obstruction Biliary stent in good position and unchanged. Bowel staple line in the right abdomen. No free air identified. Surgical clips in the left lobe liver from prior hepatectomy. No skeletal lesions. IMPRESSION: No acute abnormality. Electronically Signed   By: Franchot Gallo M.D.   On: 10/29/2016 14:21    Medications: I have reviewed the patient's current medications.  A1. 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 Microsatellite stable; preserved expression of major and minor MMR proteins. He began treatment with FOLFOX in December 2011. He completed cycle #12 on 10/27/2010. Oxaliplatin was held with cycles number 7 through 10 and resumed with  cycle #11. 1. History of neutropenia secondary to chemotherapy. 2. History of thrombocytopenia secondary chemotherapy. 3. He did not undergo a preoperative colonoscopy. He underwent a colonoscopy on 12/19/2010 with findings of a 1 cm polyp at 90 cm from the anal verge, minimal polyp at 30 cm from the anal verge and minimal diverticulosis. Colonoscopy 03/18/2012-negative. 4. 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 He began study drug on 05/13/2010. 5. Status post Port-A-Cath placement. The Port-A-Cath has been removed. 6. 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 8. Low attenuation lesion in the caudate lobe of the liver on the CT 04/11/2012-? Cyst. MRI liver on 04/20/2012 favored the caudate lobe liver lesion to represent a cyst or complex cyst. CT abdomen/pelvis 04/17/2013 with continued enlargement of hypovascular lesion in the caudate lobe of the liver.  PET scan 10/27/2011 confirmed a hypermetabolic caudate lobe liver lesion, tiny indeterminate lung nodules, and a 9 mm level II left neck node   CT biopsy of the liver lesion 11/07/2013 confirmed adenocarcinoma  Left liver and caudate resection 01/15/2014-pathology confirmed metastatic colon cancer, and negative surgical margins  Surveillance CT scans 07/24/2014 consistent with recurrent disease involving upper abdominal lymph nodes, peritoneal implants, and an anterior abdominal wall mass  Status post biopsy of the anterior abdominal wall mass 08/06/2014 with pathology showing metastatic adenocarcinoma consistent with a colon primary.  [id number] 1/28  Cycle 1 FOLFIRI/Avastin on the [facility] 1317 08/20/2014  Cycle 2 FOLFIRI/Avastin 09/03/2014  Cycle 3 held on 09/17/2014 due to neutropenia  Cycle 3  FOLFIRI/Avastin 09/25/2014. Neulasta added beginning with cycle  3.  Cycle 4 FOLFIRI/Avastin 10/08/2014  Restaging CT scans 10/18/2014 with slight interval increase in the size of the soft tissue mass in the subcutaneous fat of the epigastric region. Underlying peritoneal implants, probable focus of residual disease along the resected margin of the liver and hepatoduodenal ligament lymphadenopathy all appeared slightly improved. No new sites of metastatic disease otherwise noted. New left lower lobe bronchopneumonia.  Cycle 5 FOLFIRI/Avastin 10/22/2014  Cycle 6 FOLFIRI/Avastin 11/06/2014  Cycle 7 FOLFIRI Avastin 11/19/2014  Cycle 8 FOLFIRI/Avastin 12/03/2014  CT 12/14/2014 with stable disease  Cycle 9 FOLFIRI/Avastin 12/17/2014  Cycle 10 FOLFIRI/Avastin 01/07/2015 (5-fluorouracil and leucovorin dose reduced)  Cycle 11 FOLFIRI/Avastin 01/21/2015  Cycle 12 FOLFIRI/Avastin 02/04/2015  CT 02/13/2015 with stable disease  Cycle 13 FOLFIRI/Avastin 02/25/2015-changed to an every three-week protocol, now off study  Avastin held with cycle 14 FOLFIRI on 03/25/2015 secondary to gingivitis  Restaging CTs 05/20/2015-slight increase in the size of the epigastric subcutaneous mass, stable peritoneal implants  FOLFIRI continued on a 3 week schedule  Avastin resumed 06/17/2015  CTs 12/23/2015-stable subxiphoid mass, stable low-attenuation mass adjacent to the left liver surgical margin, peritoneal lesions-stable  FOLFIRI continued on a 4 week schedule.  Avastin placed on hold 01/06/2016  FOLFIRI discontinued 03/02/2016 secondary to enlargement of the abdominal wall mass with new superficial fungating nodules involving the skin  Palliative radiation to the abdominal wall mass completed 03/20/2016  Cycle 1 FOLFOX12/27/2017  Cycle 2 CAPOX 06/22/2016  Clinical progression 07/07/2016 and allergic reaction to oxaliplatin 06/23/2015, CAPOX discontinued  Resection of the main abdominal wall mass and an adjacent peritoneal mass on 07/23/2016 with the  pathology confirming metastatic colon cancer  [facility] CT[facility] at [facility] 09/18/2016-new liver metastasis, progressive soft tissue masses at the liver resection margin, new mass adjacent to the chest wall resection site, increase in size of peritoneal nodules  CT abdomen/pelvis 10/02/2016-interval progression of periportal adenopathy and recurrence along the hepatic resection margin. New hepatic program lesion superior right hepatic lobe. Interval increase in size of peritoneal implants left upper quadrant. Enlargement of anabdominal wall mass along the right rectus muscle.  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 This stricture was stented with a 6cm long, uncovered 4m diameter metal bile duct stent in good position.  10. Iron deposition within the liver noted on MRI 04/20/2012. The ferritin level was normal on 04/17/2013. Increased iron deposition noted on the left liver resection 01/15/2014 11. Pain/bleeding at the anus reported when here 04/24/2013. Status post evaluation by Dr. NLucia Gaskinswith findings of an anal fissure. 12. Right face cystic lesion 13. Status post Port-A-Cath placement 08/14/2014 14. Delayed nausea following FOLFIRI, prophylactic Decadron added with cycle 2 with improvement. 15. Chest CT 10/18/2014 with new left lower lobe bronchopneumonia. Levaquin initiated. Resolved on CT 12/14/2014 16. Gum pain-mucositis?, Gingivitis?-Improved with discontinuation of Avastin 17. Pain/tenderness, mild erythema and full appearance over the maxillary sinuses. Maxillofacial CT 01/30/2015 with mild to moderate bilateral maxillary and sphenoid sinus inflammatory changes. Multifocal right maxillary dental periapical lucency. Status post a right upper tooth extraction with resolution of the pain.  18. Admission 10/21/2016 with  nausea/vomiting-x-ray evidence for a partial small bowel obstruction, clinical improvement with bowel rest    Disposition:  Bryan Fields continues to have abdominal pain and irregular bowel habits. I am concerned he has a persistent partial small bowel obstruction. He will continue following a liquid diet and contact us if he has recurrent nausea/vomiting. He will return for an office visit 11/03/2016.  He continues a potassium supplement. We will check a chemistry panel when he returns next week.  His ECOG performance status is 1.  Betsy Coder, MD  10/29/2016  3:12 PM

## 2016-10-30 ENCOUNTER — Telehealth: Payer: Self-pay | Admitting: Oncology

## 2016-10-30 NOTE — Telephone Encounter (Signed)
Patient bypassed scheduling on 10/29/16. 05/29 @ 10:15, n/a called Lattie Haw, was unsuccessful. Patient scheduled for 11/03/16 @ 11:15 with Lattie Haw. Left message on patient's voicemail.

## 2016-11-03 ENCOUNTER — Ambulatory Visit (HOSPITAL_BASED_OUTPATIENT_CLINIC_OR_DEPARTMENT_OTHER): Payer: 59 | Admitting: Nurse Practitioner

## 2016-11-03 ENCOUNTER — Telehealth: Payer: Self-pay | Admitting: Oncology

## 2016-11-03 ENCOUNTER — Telehealth: Payer: Self-pay | Admitting: Pharmacist

## 2016-11-03 VITALS — BP 109/77 | HR 60 | Temp 98.0°F | Resp 18 | Ht 67.0 in | Wt 243.6 lb

## 2016-11-03 DIAGNOSIS — C787 Secondary malignant neoplasm of liver and intrahepatic bile duct: Secondary | ICD-10-CM | POA: Diagnosis not present

## 2016-11-03 DIAGNOSIS — R109 Unspecified abdominal pain: Secondary | ICD-10-CM | POA: Diagnosis not present

## 2016-11-03 DIAGNOSIS — C189 Malignant neoplasm of colon, unspecified: Secondary | ICD-10-CM

## 2016-11-03 DIAGNOSIS — C182 Malignant neoplasm of ascending colon: Secondary | ICD-10-CM | POA: Diagnosis not present

## 2016-11-03 MED ORDER — TRIFLURIDINE-TIPIRACIL 20-8.19 MG PO TABS: 35.0000 mg/m2 | ORAL_TABLET | Freq: Two times a day (BID) | ORAL | 0 refills | Status: DC

## 2016-11-03 NOTE — Telephone Encounter (Signed)
Oral Chemotherapy Pharmacist Encounter  Received new prescription for Lonsurf for patient for the treatment of colon cancer Labs in Epic reviewed, OK for treatment Current medication list in Epic assessed, no significant DDIs with Lonsurf identified  Noted patient with [facility] prescription insurance with [employer] of [employer]. Prescription has been e-scribed to [facility] in [place], [place] (ph: [phone])  Oral Oncology Clinic will continue to follow.  Johny Drilling, PharmD, BCPS, BCOP 11/03/2016  12:39 PM Oral Oncology Clinic [phone]

## 2016-11-03 NOTE — Telephone Encounter (Signed)
Gave patient AVS and calender per 5/29 LOS.Referral forwarded to [facility] in [place]

## 2016-11-03 NOTE — Progress Notes (Addendum)
[place] OFFICE PROGRESS NOTE   Diagnosis:  Colon cancer  INTERVAL HISTORY:   Bryan Fields returns as scheduled. He reports abdominal pain overall is well-controlled with a combination of ibuprofen and Dilaudid. He takes 600 mg of ibuprofen every 6 hours. He estimates taking 1 Dilaudid tablet every 6 hours as well. He denies nausea/vomiting. He continues a liquid diet. Bowels have been moving fairly regularly. He had an episode of increased abdominal pain yesterday morning. He noted free to urination during this time as well.  Objective:  Vital signs in last 24 hours:  Blood pressure 109/77, pulse 60, temperature 98 F (36.7 C), temperature source Oral, resp. rate 18, height '5\' 7"'$  (1.702 m), weight 243 lb 9.6 oz (110.5 kg), SpO2 100 %.    HEENT: No thrush or ulcers. Resp: Lungs clear bilaterally. Cardio: Regular rate and rhythm. GI: Abdomen is mildly distended. Bowel sounds present. Healing upper abdominal incision. Vascular: No leg edema. Left lower leg is slightly larger than the right lower leg.  Port-A-Cath without erythema.  Lab Results:  Lab Results  Component Value Date   WBC 8.1 10/21/2016   HGB 13.9 10/21/2016   HCT 40.8 10/21/2016   MCV 97.5 10/21/2016   PLT 205 10/21/2016   NEUTROABS 6.6 (H) 10/21/2016    Imaging:  No results found.  Medications: I have reviewed the patient's current medications.  Assessment/Plan: 1. 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 Microsatellite stable; preserved expression of major and minor MMR proteins. He began treatment with FOLFOX in December 2011. He completed cycle #12 on 10/27/2010. Oxaliplatin was held with cycles number 7 through 10 and resumed with cycle #11. 1. History of neutropenia secondary to chemotherapy. 2. History of thrombocytopenia secondary chemotherapy. 3. He did not undergo a  preoperative colonoscopy. He underwent a colonoscopy on 12/19/2010 with findings of a 1 cm polyp at 90 cm from the anal verge, minimal polyp at 30 cm from the anal verge and minimal diverticulosis. Colonoscopy 03/18/2012-negative. 4. 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 He began study drug on 05/13/2010. 5. Status post Port-A-Cath placement. The Port-A-Cath has been removed. 6. 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 8. Low attenuation lesion in the caudate lobe of the liver on the CT 04/11/2012-? Cyst. MRI liver on 04/20/2012 favored the caudate lobe liver lesion to represent a cyst or complex cyst. CT abdomen/pelvis 04/17/2013 with continued enlargement of hypovascular lesion in the caudate lobe of the liver.  PET scan 10/27/2011 confirmed a hypermetabolic caudate lobe liver lesion, tiny indeterminate lung nodules, and a 9 mm level II left neck node   CT biopsy of the liver lesion 11/07/2013 confirmed adenocarcinoma  Left liver and caudate resection 01/15/2014-pathology confirmed metastatic colon cancer, and negative surgical margins  Surveillance CT scans 07/24/2014 consistent with recurrent disease involving upper abdominal lymph nodes, peritoneal implants, and an anterior abdominal wall mass  Status post biopsy of the anterior abdominal wall mass 08/06/2014 with pathology showing metastatic adenocarcinoma consistent with a colon primary.  UGT1A1 1/28  Cycle 1 FOLFIRI/Avastin on the [facility] 1317 08/20/2014  Cycle 2 FOLFIRI/Avastin 09/03/2014  Cycle 3 held on 09/17/2014 due to neutropenia  Cycle 3 FOLFIRI/Avastin 09/25/2014. Neulasta added beginning with cycle 3.  Cycle 4 FOLFIRI/Avastin 10/08/2014  Restaging CT scans 10/18/2014  with slight interval increase in the size of the soft tissue mass in the  subcutaneous fat of the epigastric region. Underlying peritoneal implants, probable focus of residual disease along the resected margin of the liver and hepatoduodenal ligament lymphadenopathy all appeared slightly improved. No new sites of metastatic disease otherwise noted. New left lower lobe bronchopneumonia.  Cycle 5 FOLFIRI/Avastin 10/22/2014  Cycle 6 FOLFIRI/Avastin 11/06/2014  Cycle 7 FOLFIRI Avastin 11/19/2014  Cycle 8 FOLFIRI/Avastin 12/03/2014  CT 12/14/2014 with stable disease  Cycle 9 FOLFIRI/Avastin 12/17/2014  Cycle 10 FOLFIRI/Avastin 01/07/2015 (5-fluorouracil and leucovorin dose reduced)  Cycle 11 FOLFIRI/Avastin 01/21/2015  Cycle 12 FOLFIRI/Avastin 02/04/2015  CT 02/13/2015 with stable disease  Cycle 13 FOLFIRI/Avastin 02/25/2015-changed to an every three-week protocol, now off study  Avastin held with cycle 14 FOLFIRI on 03/25/2015 secondary to gingivitis  Restaging CTs 05/20/2015-slight increase in the size of the epigastric subcutaneous mass, stable peritoneal implants  FOLFIRI continued on a 3 week schedule  Avastin resumed 06/17/2015  CTs 12/23/2015-stable subxiphoid mass, stable low-attenuation mass adjacent to the left liver surgical margin, peritoneal lesions-stable  FOLFIRI continued on a 4 week schedule.  Avastin placed on hold 01/06/2016  FOLFIRI discontinued 03/02/2016 secondary to enlargement of the abdominal wall mass with new superficial fungating nodules involving the skin  Palliative radiation to the abdominal wall mass completed 03/20/2016  Cycle 1 FOLFOX12/27/2017  Cycle 2 CAPOX 06/22/2016  Clinical progression 07/07/2016 and allergic reaction to oxaliplatin 06/23/2015, CAPOX discontinued  Resection of the main abdominal wall mass and an adjacent peritoneal mass on 07/23/2016 with the pathology confirming metastatic colon cancer  [facility]aging CTs at [facility] 09/18/2016-new liver metastasis, progressive soft tissue masses at the liver  resection margin, new mass adjacent to the chest wall resection site, increase in size of peritoneal nodules  CT abdomen/pelvis 10/02/2016-interval progression of periportal adenopathy and recurrence along the hepatic resection margin. New hepatic program lesion superior right hepatic lobe. Interval increase in size of peritoneal implants left upper quadrant. Enlargement of anabdominal wall mass along the right rectus muscle.  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 This stricture was stented with a 6cm long, uncovered 25m diameter metal bile duct stent in good position.   Initiation of Lonsurf cycle 1 11/09/2016 10. Iron deposition within the liver noted on MRI 04/20/2012. The ferritin level was normal on 04/17/2013. Increased iron deposition noted on the left liver resection 01/15/2014 11. Pain/bleeding at the anus reported when here 04/24/2013. Status post evaluation by Dr. NLucia Gaskinswith findings of an anal fissure. 12. Right face cystic lesion 13. Status post Port-A-Cath placement 08/14/2014 14. Delayed nausea following FOLFIRI, prophylactic Decadron added with cycle 2 with improvement. 15. Chest CT 10/18/2014 with new left lower lobe bronchopneumonia. Levaquin initiated. Resolved on CT 12/14/2014 16. Gum pain-mucositis?, Gingivitis?-Improved with discontinuation of Avastin 17. Pain/tenderness, mild erythema and full appearance over the maxillary sinuses. Maxillofacial CT 01/30/2015 with mild to moderate bilateral maxillary and sphenoid sinus inflammatory changes. Multifocal right maxillary dental periapical lucency. Status post a right upper tooth extraction with resolution of the pain.  18. Admission 10/21/2016 with nausea/vomiting-x-ray evidence for a partial small bowel obstruction, clinical improvement with bowel  rest     Disposition: Bryan Fields appears stable. Bowels are moving on a more regular basis. He is tolerating a liquid diet. He will begin a low residual diet.   We discussed initiating Lonsurf beginning 11/09/2016. He is in agreement with this plan. Potential toxicities again reviewed.  His pain is well-controlled with a combination of ibuprofen and Dilaudid. He understands he is over the daily dosing limit for ibuprofen. He further understands the association between ibuprofen and gastrointestinal bleeding. We recommended he decrease the ibuprofen from 600 mg 4 times daily to 400 mg 4 times daily. He is currently taking Prilosec every other day. He will begin to take it on a daily basis. He will contact the office with poorly controlled pain.  He will return for a follow-up visit on 11/20/2016. He will contact the office in the interim with any problems.  We made a referral for a follow-up visit with Dr. Aleatha Borer at [facility].  Patient seen with Dr. Benay Spice. 25 minutes were spent face-to-face at today's visit with the majority of that time involved in counseling/carbonation of care.    Ned Card ANP/GNP-BC   11/03/2016  11:23 AM This was a shared visit with Ned Card. Bryan Fields has an improved performance status. The plan is to proceed with Lonsurf when this is available. The hope is to begin cycle 1 on 11/09/2016.  The obstructive symptoms have improved. He will follow a low residual diet.  We adjusted the analgesic regimen today.  He will schedule an appointment with Dr. Aleatha Borer to discuss the current clinical trial status at [facility].  Julieanne Manson, M.D.

## 2016-11-03 NOTE — Telephone Encounter (Signed)
Oral Chemotherapy Pharmacist Encounter  Received notification from [facility] that they cannot dispense [place]. Prescription has been e-scribed [facility] [place], [place] (ph: [phone])  Oral Oncology Clinic will continue to follow.  Johny Drilling, PharmD, BCPS, BCOP 11/03/2016  2:02 PM Oral Oncology Clinic [phone]

## 2016-11-05 NOTE — Telephone Encounter (Signed)
Oral Chemotherapy Pharmacist Encounter  Received notification from [employer] that [facility] prescription requires prior authorization.  PA submitted on CoverMyMeds Key [id number] Status is pendiing  Oral Oncology Clinic will continue to follow.  Johny Drilling, PharmD, BCPS, BCOP 11/05/2016  4:15 PM Oral Oncology Clinic [phone]

## 2016-11-06 NOTE — Telephone Encounter (Signed)
Message from pt calling to follow up on [place] script. Informed him of pharmacy #, prior Josem Kaufmann has been submitted. We are waiting to hear back.

## 2016-11-09 ENCOUNTER — Telehealth: Payer: Self-pay | Admitting: Oncology

## 2016-11-09 NOTE — Telephone Encounter (Signed)
Oral Chemotherapy Pharmacist Encounter  Received notification from [facility] that prior authorization of [place] had been DENIED as the insurance company did not have documentation of RAS mutational status. I called [employer] at [phone] I provided RAS mutational status for PA review. Status is pending.  Oral oncology Clinic will continue to follow.  Johny Drilling, PharmD, BCPS, BCOP 11/09/2016  4:22 PM Oral Oncology Clinic [phone]

## 2016-11-09 NOTE — Anesthesia Postprocedure Evaluation (Signed)
Anesthesia Post Note  Patient: Bryan Fields  Procedure(s) Performed: Procedure(s) (LRB): ENDOSCOPIC RETROGRADE CHOLANGIOPANCREATOGRAPHY (ERCP) (N/A)     Anesthesia Post Evaluation  Last Vitals:  Vitals:   10/15/16 1240 10/15/16 1250  BP: (!) 191/78 (!) 182/79  Pulse: 64 (!) 55  Resp: 15 13  Temp:  36.7 C    Last Pain:  Vitals:   10/19/16 1515  TempSrc:   PainSc: 0-No pain                 Brittyn Salaz S

## 2016-11-09 NOTE — Addendum Note (Signed)
Addendum  created 11/09/16 1432 by Audrea Bolte, MD   Sign clinical note    

## 2016-11-09 NOTE — Telephone Encounter (Signed)
Pt appt. With Dr. Aleatha Borer is 12/04/16 @8 :00. Pt is aware.

## 2016-11-11 ENCOUNTER — Telehealth: Payer: Self-pay | Admitting: *Deleted

## 2016-11-11 ENCOUNTER — Other Ambulatory Visit: Payer: Self-pay | Admitting: *Deleted

## 2016-11-11 NOTE — Telephone Encounter (Signed)
Message received from [facility] requesting a call back regarding [place].  Call placed back to [facility] and Lonsurf prescription confirmed with operator along with all other questions regarding patient information.

## 2016-11-11 NOTE — Telephone Encounter (Signed)
Oral Chemotherapy Pharmacist Encounter  Received notification from [facility] that denial of prior authorization of Bryan Fields has been overturned and medication is now APPROVED Effective dates: 11/09/16-11/09/17 [id number]  I will alert pharmacy to continue to process prescription.  Oral Oncology Clinic will continue to follow.  Johny Drilling, PharmD, BCPS, BCOP 11/11/2016  1:41 PM Oral Oncology Clinic [phone]

## 2016-11-12 ENCOUNTER — Telehealth: Payer: Self-pay | Admitting: *Deleted

## 2016-11-12 ENCOUNTER — Other Ambulatory Visit: Payer: Self-pay | Admitting: *Deleted

## 2016-11-12 NOTE — Telephone Encounter (Signed)
Message left with patient to inform him that [facility] prescription will be filled with [facility] and is ready to be sent out.  Patient informed to call [phone] for delivery of medication.  Instructed patient to call [place] [facility] back with any questions.

## 2016-11-16 ENCOUNTER — Telehealth: Payer: Self-pay | Admitting: *Deleted

## 2016-11-16 NOTE — Telephone Encounter (Signed)
Pt left voicemail with switchboard on 6/8 that was sent to nursing today. Returned call: he spoke with [facility] pharmacy and was told they needed to "redo" the script.  Called pharmacy, requested they expedite script. Was told they will contact pt today so he can begin med on 6/12.

## 2016-11-17 ENCOUNTER — Telehealth: Payer: Self-pay | Admitting: Oncology

## 2016-11-17 NOTE — Telephone Encounter (Addendum)
Message from pt reporting he received Lonsurf today. "Should I take it Tues through Sat for this cycle?"  Reviewed above with Dr. Benay Spice: YES. Move office visit to pt's week off treatment.  Returned call to pt, he will begin [place] today. Teach back complete. Pt requests office visit 6/22- he is going out of town on 6/25. Message to schedulers.

## 2016-11-17 NOTE — Telephone Encounter (Signed)
Spoke with patient re 6/22 appointments. Per 6/12 schedule message appointments moved from 6/15 to 6/22.

## 2016-11-20 ENCOUNTER — Ambulatory Visit: Payer: 59 | Admitting: Oncology

## 2016-11-20 ENCOUNTER — Other Ambulatory Visit: Payer: 59

## 2016-11-25 ENCOUNTER — Other Ambulatory Visit: Payer: Self-pay | Admitting: *Deleted

## 2016-11-25 MED ORDER — POTASSIUM CHLORIDE CRYS ER 20 MEQ PO TBCR: 20.0000 meq | EXTENDED_RELEASE_TABLET | Freq: Every day | ORAL | 0 refills | Status: DC

## 2016-11-27 ENCOUNTER — Other Ambulatory Visit: Payer: Self-pay | Admitting: *Deleted

## 2016-11-27 ENCOUNTER — Ambulatory Visit (HOSPITAL_BASED_OUTPATIENT_CLINIC_OR_DEPARTMENT_OTHER): Payer: 59 | Admitting: Oncology

## 2016-11-27 ENCOUNTER — Telehealth: Payer: Self-pay | Admitting: Oncology

## 2016-11-27 ENCOUNTER — Ambulatory Visit: Payer: 59

## 2016-11-27 ENCOUNTER — Other Ambulatory Visit (HOSPITAL_BASED_OUTPATIENT_CLINIC_OR_DEPARTMENT_OTHER): Payer: 59

## 2016-11-27 VITALS — BP 110/64 | HR 53 | Temp 98.6°F | Resp 20 | Ht 67.0 in | Wt 233.3 lb

## 2016-11-27 DIAGNOSIS — C189 Malignant neoplasm of colon, unspecified: Secondary | ICD-10-CM

## 2016-11-27 DIAGNOSIS — C787 Secondary malignant neoplasm of liver and intrahepatic bile duct: Secondary | ICD-10-CM

## 2016-11-27 DIAGNOSIS — C182 Malignant neoplasm of ascending colon: Secondary | ICD-10-CM

## 2016-11-27 DIAGNOSIS — Z95828 Presence of other vascular implants and grafts: Secondary | ICD-10-CM

## 2016-11-27 DIAGNOSIS — R5381 Other malaise: Secondary | ICD-10-CM

## 2016-11-27 DIAGNOSIS — R110 Nausea: Secondary | ICD-10-CM

## 2016-11-27 DIAGNOSIS — R400 Somnolence: Secondary | ICD-10-CM | POA: Diagnosis not present

## 2016-11-27 LAB — CBC WITH DIFFERENTIAL/PLATELET
BASO%: 0.3 % (ref 0.0–2.0)
Basophils Absolute: 0 10*3/uL (ref 0.0–0.1)
EOS%: 1.9 % (ref 0.0–7.0)
Eosinophils Absolute: 0.1 10*3/uL (ref 0.0–0.5)
HCT: 38.6 % (ref 38.4–49.9)
HEMOGLOBIN: 12.8 g/dL — AB (ref 13.0–17.1)
LYMPH%: 30.7 % (ref 14.0–49.0)
MCH: 31.7 pg (ref 27.2–33.4)
MCHC: 33.2 g/dL (ref 32.0–36.0)
MCV: 95.5 fL (ref 79.3–98.0)
MONO#: 0.2 10*3/uL (ref 0.1–0.9)
MONO%: 5.1 % (ref 0.0–14.0)
NEUT%: 62 % (ref 39.0–75.0)
NEUTROS ABS: 2.3 10*3/uL (ref 1.5–6.5)
Platelets: 118 10*3/uL — ABNORMAL LOW (ref 140–400)
RBC: 4.04 10*6/uL — ABNORMAL LOW (ref 4.20–5.82)
RDW: 13.1 % (ref 11.0–14.6)
WBC: 3.7 10*3/uL — ABNORMAL LOW (ref 4.0–10.3)
lymph#: 1.1 10*3/uL (ref 0.9–3.3)

## 2016-11-27 LAB — COMPREHENSIVE METABOLIC PANEL
ALBUMIN: 3.6 g/dL (ref 3.5–5.0)
ALK PHOS: 93 U/L (ref 40–150)
ALT: 34 U/L (ref 0–55)
AST: 32 U/L (ref 5–34)
Anion Gap: 10 mEq/L (ref 3–11)
BILIRUBIN TOTAL: 1.18 mg/dL (ref 0.20–1.20)
BUN: 18 mg/dL (ref 7.0–26.0)
CO2: 25 mEq/L (ref 22–29)
Calcium: 9.6 mg/dL (ref 8.4–10.4)
Chloride: 104 mEq/L (ref 98–109)
Creatinine: 0.9 mg/dL (ref 0.7–1.3)
EGFR: 89 mL/min/{1.73_m2} — ABNORMAL LOW (ref >90)
GLUCOSE: 112 mg/dL (ref 70–140)
Potassium: 4.3 mEq/L (ref 3.5–5.1)
Sodium: 139 mEq/L (ref 136–145)
TOTAL PROTEIN: 7.3 g/dL (ref 6.4–8.3)

## 2016-11-27 MED ORDER — PROCHLORPERAZINE MALEATE 10 MG PO TABS: 10.0000 mg | ORAL_TABLET | Freq: Four times a day (QID) | ORAL | 1 refills | Status: DC | PRN

## 2016-11-27 MED ORDER — ONDANSETRON HCL 8 MG PO TABS: 8.0000 mg | ORAL_TABLET | Freq: Three times a day (TID) | ORAL | 2 refills | Status: DC | PRN

## 2016-11-27 MED ORDER — TRIFLURIDINE-TIPIRACIL 20-8.19 MG PO TABS: 35.0000 mg/m2 | ORAL_TABLET | Freq: Two times a day (BID) | ORAL | 0 refills | Status: DC

## 2016-11-27 MED ORDER — HEPARIN SOD (PORK) LOCK FLUSH 100 UNIT/ML IV SOLN
500.0000 [IU] | Freq: Once | INTRAVENOUS | Status: AC | PRN
  Administered 2016-11-27: 500 [IU] via INTRAVENOUS
  Filled 2016-11-27: qty 5

## 2016-11-27 MED ORDER — HYDROMORPHONE HCL 2 MG PO TABS: 2.0000 mg | ORAL_TABLET | ORAL | 0 refills | Status: DC | PRN

## 2016-11-27 MED ORDER — SODIUM CHLORIDE 0.9 % IJ SOLN
10.0000 mL | INTRAMUSCULAR | Status: DC | PRN
Start: 2016-11-27 — End: 2016-11-27
  Administered 2016-11-27: 10 mL via INTRAVENOUS
  Filled 2016-11-27: qty 10

## 2016-11-27 NOTE — Progress Notes (Signed)
[facility] OFFICE PROGRESS NOTE   Diagnosis: Colon cancer  INTERVAL HISTORY:   Bryan Fields returns as scheduled. He began cycle 1 Lonsurf on 11/17/2016. No diarrhea or rash. He has intermittent nausea and takes Compazine once or twice daily. He is having bowel movements. He continues a mechanical soft diet. He complains of malaise and somnolence. He does not relate the somnolence to his medical regimen.  Objective:  Vital signs in last 24 hours:  Blood pressure 110/64, pulse (!) 53, temperature 98.6 F (37 C), temperature source Oral, resp. rate 20, height _0  (1.702 m), weight 233 lb 4.8 oz (105.8 kg), SpO2 100 %.    HEENT: No thrush or ulcers Resp: Lungs clear bilaterally Cardio: Regular rate and rhythm GI: Fullness in the right upper abdomen without a discrete liver edge, active bowel sounds, the upper abdominal incision has healed with a visible suture present Vascular: No leg edema Neuro: Alert and oriented     Portacath/PICC-without erythema  Lab Results:  Lab Results  Component Value Date   WBC 3.7 (L) 11/27/2016   HGB 12.8 (L) 11/27/2016   HCT 38.6 11/27/2016   MCV 95.5 11/27/2016   PLT 118 (L) 11/27/2016   NEUTROABS 2.3 11/27/2016    CMP     Component Value Date/Time   NA 139 11/27/2016 1127   K 4.3 11/27/2016 1127   CL 101 10/26/2016 0435   CL 103 04/11/2012 1257   CO2 25 11/27/2016 1127   GLUCOSE 112 11/27/2016 1127   GLUCOSE 89 04/11/2012 1257   BUN 18.0 11/27/2016 1127   CREATININE 0.9 11/27/2016 1127   CALCIUM 9.6 11/27/2016 1127   PROT 7.3 11/27/2016 1127   ALBUMIN 3.6 11/27/2016 1127   AST 32 11/27/2016 1127   ALT 34 11/27/2016 1127   ALKPHOS 93 11/27/2016 1127   BILITOT 1.18 11/27/2016 1127   GFRNONAA >60 10/26/2016 0435   GFRAA >60 10/26/2016 0435     Medications: I have reviewed the patient's current medications.  Assessment/Plan: 1. 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 Microsatellite stable; preserved expression of major and minor MMR proteins. He began treatment with FOLFOX in December 2011. He completed cycle #12 on 10/27/2010. Oxaliplatin was held with cycles number 7 through 10 and resumed with cycle #11. 1. History of neutropenia secondary to chemotherapy. 2. History of thrombocytopenia secondary chemotherapy. 3. He did not undergo a preoperative colonoscopy. He underwent a colonoscopy on 12/19/2010 with findings of a 1 cm polyp at 90 cm from the anal verge, minimal polyp at 30 cm from the anal verge and minimal diverticulosis. Colonoscopy 03/18/2012-negative. 4. 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 He began study drug on 05/13/2010. 5. Status post Port-A-Cath placement. The Port-A-Cath has been removed. 6. 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 8. Low attenuation lesion in the caudate lobe of the liver on the CT 04/11/2012-? Cyst. MRI liver on 04/20/2012 favored the caudate lobe liver lesion to represent a cyst or complex cyst. CT abdomen/pelvis 04/17/2013 with continued enlargement of hypovascular lesion in the caudate lobe of the liver.  PET scan 10/27/2011 confirmed a hypermetabolic caudate lobe liver lesion, tiny indeterminate lung nodules, and a 9 mm level II left neck node   CT biopsy of the liver lesion 11/07/2013 confirmed  adenocarcinoma  Left liver and caudate resection 01/15/2014-pathology confirmed metastatic colon cancer, and negative surgical margins  Surveillance CT scans 07/24/2014 consistent with recurrent disease involving upper abdominal lymph nodes, peritoneal implants, and an anterior abdominal wall mass  Status post biopsy of the anterior abdominal wall mass 08/06/2014 with  pathology showing metastatic adenocarcinoma consistent with a colon primary.  UGT1A1 1/28  Cycle 1 FOLFIRI/Avastin on the [facility] 1317 08/20/2014  Cycle 2 FOLFIRI/Avastin 09/03/2014  Cycle 3 held on 09/17/2014 due to neutropenia  Cycle 3 FOLFIRI/Avastin 09/25/2014. Neulasta added beginning with cycle 3.  Cycle 4 FOLFIRI/Avastin 10/08/2014  Restaging CT scans 10/18/2014 with slight interval increase in the size of the soft tissue mass in the subcutaneous fat of the epigastric region. Underlying peritoneal implants, probable focus of residual disease along the resected margin of the liver and hepatoduodenal ligament lymphadenopathy all appeared slightly improved. No new sites of metastatic disease otherwise noted. New left lower lobe bronchopneumonia.  Cycle 5 FOLFIRI/Avastin 10/22/2014  Cycle 6 FOLFIRI/Avastin 11/06/2014  Cycle 7 FOLFIRI Avastin 11/19/2014  Cycle 8 FOLFIRI/Avastin 12/03/2014  CT 12/14/2014 with stable disease  Cycle 9 FOLFIRI/Avastin 12/17/2014  Cycle 10 FOLFIRI/Avastin 01/07/2015 (5-fluorouracil and leucovorin dose reduced)  Cycle 11 FOLFIRI/Avastin 01/21/2015  Cycle 12 FOLFIRI/Avastin 02/04/2015  CT 02/13/2015 with stable disease  Cycle 13 FOLFIRI/Avastin 02/25/2015-changed to an every three-week protocol, now off study  Avastin held with cycle 14 FOLFIRI on 03/25/2015 secondary to gingivitis  Restaging CTs 05/20/2015-slight increase in the size of the epigastric subcutaneous mass, stable peritoneal implants  FOLFIRI continued on a 3 week schedule  Avastin resumed 06/17/2015  CTs 12/23/2015-stable subxiphoid mass, stable low-attenuation mass adjacent to the left liver surgical margin, peritoneal lesions-stable  FOLFIRI continued on a 4 week schedule.  Avastin placed on hold 01/06/2016  FOLFIRI discontinued 03/02/2016 secondary to enlargement of the abdominal wall mass with new superficial fungating nodules involving the skin  Palliative radiation  to the abdominal wall mass completed 03/20/2016  Cycle 1 FOLFOX12/27/2017  Cycle 2 CAPOX 06/22/2016  Clinical progression 07/07/2016 and allergic reaction to oxaliplatin 06/23/2015, CAPOX discontinued  Resection of the main abdominal wall mass and an adjacent peritoneal mass on 07/23/2016 with the pathology confirming metastatic colon cancer  [facility]aging CTs at [facility] 09/18/2016-new liver metastasis, progressive soft tissue masses at the liver resection margin, new mass adjacent to the chest wall resection site, increase in size of peritoneal nodules  CT abdomen/pelvis 10/02/2016-interval progression of periportal adenopathy and recurrence along the hepatic resection margin. New hepatic program lesion superior right hepatic lobe. Interval increase in size of peritoneal implants left upper quadrant. Enlargement of anabdominal wall mass along the right rectus muscle.  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 This stricture was stented with a 6cm long, uncovered 23m diameter metal bile duct stent in good position.   Initiation of Lonsurf cycle 1 11/17/2016 10. Iron deposition within the liver noted on MRI 04/20/2012. The ferritin level was normal on 04/17/2013. Increased iron deposition noted on the left liver resection 01/15/2014 11. Pain/bleeding at the anus reported when here 04/24/2013. Status post evaluation by Dr. NLucia Gaskinswith findings of an anal fissure. 12. Right face cystic lesion 13. Status post Port-A-Cath placement 08/14/2014 14. Delayed nausea following FOLFIRI, prophylactic Decadron added with cycle 2 with improvement. 15. Chest CT 10/18/2014 with new left lower lobe bronchopneumonia. Levaquin initiated. Resolved on CT 12/14/2014 16. Gum pain-mucositis?, Gingivitis?-Improved with discontinuation of Avastin 17. Pain/tenderness, mild erythema and  full appearance over the maxillary sinuses.  Maxillofacial CT 01/30/2015 with mild to moderate bilateral maxillary and sphenoid sinus inflammatory changes. Multifocal right maxillary dental periapical lucency. Status post a right upper tooth extraction with resolution of the pain. 18. Admission 10/21/2016 with nausea/vomiting-x-ray evidence for a partial small bowel obstruction, clinical improvement with bowel rest   Disposition:  Mr. Stoudt will complete the first cycle of Lonsurf tomorrow. His overall performance status is declining. I suspect the malaise/somnolence are related to the systemic tumor burden and polypharmacy. I encouraged him to continue a mechanical soft diet.  He will return for an office visit on 12/11/2016 with the plan to begin the next cycle of Lonsurf on 12/14/2016. Mr. Dauzat is scheduled see Dr. Aleatha Borer 12/04/2016 to consider clinical trial options.  He will try Zofran for nausea.  15 minutes were spent with the patient today. The majority of the time was used for counseling and coordination of care.  Donneta Romberg, MD  11/27/2016  12:53 PM

## 2016-11-27 NOTE — Telephone Encounter (Signed)
Scheduled appt per 6/22 los - Gave patient AVS and calender per los.  

## 2016-11-27 NOTE — Patient Instructions (Signed)

## 2016-12-07 ENCOUNTER — Ambulatory Visit (HOSPITAL_BASED_OUTPATIENT_CLINIC_OR_DEPARTMENT_OTHER): Payer: 59 | Admitting: Oncology

## 2016-12-07 ENCOUNTER — Other Ambulatory Visit: Payer: Self-pay

## 2016-12-07 ENCOUNTER — Telehealth: Payer: Self-pay | Admitting: Oncology

## 2016-12-07 ENCOUNTER — Telehealth: Payer: Self-pay

## 2016-12-07 VITALS — BP 142/78 | HR 77 | Temp 98.5°F | Resp 20 | Ht 67.0 in | Wt 232.3 lb

## 2016-12-07 DIAGNOSIS — C182 Malignant neoplasm of ascending colon: Secondary | ICD-10-CM | POA: Diagnosis not present

## 2016-12-07 DIAGNOSIS — R110 Nausea: Secondary | ICD-10-CM

## 2016-12-07 DIAGNOSIS — C787 Secondary malignant neoplasm of liver and intrahepatic bile duct: Secondary | ICD-10-CM

## 2016-12-07 DIAGNOSIS — K5651 Intestinal adhesions [bands], with partial obstruction: Secondary | ICD-10-CM | POA: Diagnosis not present

## 2016-12-07 DIAGNOSIS — C189 Malignant neoplasm of colon, unspecified: Secondary | ICD-10-CM

## 2016-12-07 MED ORDER — METOCLOPRAMIDE HCL 10 MG PO TABS: 10.0000 mg | ORAL_TABLET | Freq: Every day | ORAL | 0 refills | Status: DC

## 2016-12-07 MED ORDER — HYDROMORPHONE HCL 2 MG PO TABS: 2.0000 mg | ORAL_TABLET | ORAL | 0 refills | Status: DC | PRN

## 2016-12-07 NOTE — Telephone Encounter (Signed)
Scheduled appt per 7/2 los - Gave patient AVS and calender per los.  

## 2016-12-07 NOTE — Telephone Encounter (Signed)
Called patient to ask if he can switch his appt from 12/11/16 to 12/07/16 at 1500 per MD Sherrill. Pt is agreeable to appt change and states that he will be here today

## 2016-12-07 NOTE — Progress Notes (Signed)
[place] OFFICE PROGRESS NOTE   Diagnosis: Colon cancer  INTERVAL HISTORY:   Bryan Fields returns as scheduled. He took a trip to the mountains last week. When he returned on 12/03/2016 he developed constipation and nausea/vomiting. He saw Dr. Aleatha Borer on 12/04/2016. She did not recommend a clinical trial secondary to the intermittent obstructive symptoms.  On 12/04/2016 he began having bowel movements. This continued through the weekend he now feels much better. He is now tolerating a soft diet. He has abdominal soreness. He has nausea that is not relieved with Compazine or Zofran.  Objective:  Vital signs in last 24 hours:  Blood pressure (!) 142/78, pulse 77, temperature 98.5 F (36.9 C), temperature source Oral, resp. rate 20, height _0  (1.702 m), weight 232 lb 4.8 oz (105.4 kg), SpO2 100 %.    HEENT: No thrush or ulcers Resp: Lungs clear bilaterally Cardio: Regular rate and rhythm GI: Mildly distended, no hepatomegaly, small opening at the upper transverse incision, mild diffuse tenderness Vascular: No leg edema    Portacath/PICC-without erythema  Lab Results:  Lab Results  Component Value Date   WBC 3.7 (L) 11/27/2016   HGB 12.8 (L) 11/27/2016   HCT 38.6 11/27/2016   MCV 95.5 11/27/2016   PLT 118 (L) 11/27/2016   NEUTROABS 2.3 11/27/2016    CMP     Component Value Date/Time   NA 139 11/27/2016 1127   K 4.3 11/27/2016 1127   CL 101 10/26/2016 0435   CL 103 04/11/2012 1257   CO2 25 11/27/2016 1127   GLUCOSE 112 11/27/2016 1127   GLUCOSE 89 04/11/2012 1257   BUN 18.0 11/27/2016 1127   CREATININE 0.9 11/27/2016 1127   CALCIUM 9.6 11/27/2016 1127   PROT 7.3 11/27/2016 1127   ALBUMIN 3.6 11/27/2016 1127   AST 32 11/27/2016 1127   ALT 34 11/27/2016 1127   ALKPHOS 93 11/27/2016 1127   BILITOT 1.18 11/27/2016 1127   GFRNONAA >60 10/26/2016 0435   GFRAA >60 10/26/2016 0435    Lab Results  Component Value Date   CEA1 2.32 06/03/2016      Medications: I have reviewed the patient's current medications.  Assessment/Plan: 1. 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 Microsatellite stable; preserved expression of major and minor MMR proteins. He began treatment with FOLFOX in December 2011. He completed cycle #12 on 10/27/2010. Oxaliplatin was held with cycles number 7 through 10 and resumed with cycle #11. 1. History of neutropenia secondary to chemotherapy. 2. History of thrombocytopenia secondary chemotherapy. 3. He did not undergo a preoperative colonoscopy. He underwent a colonoscopy on 12/19/2010 with findings of a 1 cm polyp at 90 cm from the anal verge, minimal polyp at 30 cm from the anal verge and minimal diverticulosis. Colonoscopy 03/18/2012-negative. 4. 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 He began study drug on 05/13/2010. 5. Status post Port-A-Cath placement. The Port-A-Cath has been removed. 6. 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 8. Low attenuation lesion in the caudate lobe of the liver on the CT 04/11/2012-? Cyst. MRI liver on 04/20/2012 favored the caudate lobe liver lesion to represent a cyst or complex cyst. CT abdomen/pelvis 04/17/2013 with continued enlargement of hypovascular lesion in the caudate lobe of the liver.  PET scan 10/27/2011 confirmed a hypermetabolic  caudate lobe liver lesion, tiny indeterminate lung nodules, and a 9 mm level II left neck node   CT biopsy of the liver lesion 11/07/2013 confirmed adenocarcinoma  Left liver and caudate resection 01/15/2014-pathology confirmed metastatic colon cancer, and negative surgical margins  Surveillance CT scans 07/24/2014 consistent with recurrent disease  involving upper abdominal lymph nodes, peritoneal implants, and an anterior abdominal wall mass  Status post biopsy of the anterior abdominal wall mass 08/06/2014 with pathology showing metastatic adenocarcinoma consistent with a colon primary.  UGT1A1 1/28  Cycle 1 FOLFIRI/Avastin on the [facility] 1317 08/20/2014  Cycle 2 FOLFIRI/Avastin 09/03/2014  Cycle 3 held on 09/17/2014 due to neutropenia  Cycle 3 FOLFIRI/Avastin 09/25/2014. Neulasta added beginning with cycle 3.  Cycle 4 FOLFIRI/Avastin 10/08/2014  Restaging CT scans 10/18/2014 with slight interval increase in the size of the soft tissue mass in the subcutaneous fat of the epigastric region. Underlying peritoneal implants, probable focus of residual disease along the resected margin of the liver and hepatoduodenal ligament lymphadenopathy all appeared slightly improved. No new sites of metastatic disease otherwise noted. New left lower lobe bronchopneumonia.  Cycle 5 FOLFIRI/Avastin 10/22/2014  Cycle 6 FOLFIRI/Avastin 11/06/2014  Cycle 7 FOLFIRI Avastin 11/19/2014  Cycle 8 FOLFIRI/Avastin 12/03/2014  CT 12/14/2014 with stable disease  Cycle 9 FOLFIRI/Avastin 12/17/2014  Cycle 10 FOLFIRI/Avastin 01/07/2015 (5-fluorouracil and leucovorin dose reduced)  Cycle 11 FOLFIRI/Avastin 01/21/2015  Cycle 12 FOLFIRI/Avastin 02/04/2015  CT 02/13/2015 with stable disease  Cycle 13 FOLFIRI/Avastin 02/25/2015-changed to an every three-week protocol, now off study  Avastin held with cycle 14 FOLFIRI on 03/25/2015 secondary to gingivitis  Restaging CTs 05/20/2015-slight increase in the size of the epigastric subcutaneous mass, stable peritoneal implants  FOLFIRI continued on a 3 week schedule  Avastin resumed 06/17/2015  CTs 12/23/2015-stable subxiphoid mass, stable low-attenuation mass adjacent to the left liver surgical margin, peritoneal lesions-stable  FOLFIRI continued on a 4 week schedule.  Avastin placed on hold  01/06/2016  FOLFIRI discontinued 03/02/2016 secondary to enlargement of the abdominal wall mass with new superficial fungating nodules involving the skin  Palliative radiation to the abdominal wall mass completed 03/20/2016  Cycle 1 FOLFOX12/27/2017  Cycle 2 CAPOX 06/22/2016  Clinical progression 07/07/2016 and allergic reaction to oxaliplatin 06/23/2015, CAPOX discontinued  Resection of the main abdominal wall mass and an adjacent peritoneal mass on 07/23/2016 with the pathology confirming metastatic colon cancer  [facility] at [facility] 09/18/2016-new liver metastasis, progressive soft tissue masses at the liver resection margin, new mass adjacent to the chest wall resection site, increase in size of peritoneal nodules  CT abdomen/pelvis 10/02/2016-interval progression of periportal adenopathy and recurrence along the hepatic resection margin. New hepatic program lesion superior right hepatic lobe. Interval increase in size of peritoneal implants left upper quadrant. Enlargement of anabdominal wall mass along the right rectus muscle.  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 This stricture was stented with a 6cm long, uncovered 2m diameter metal bile duct stent in good position.   Initiation of Lonsurf cycle 1 11/17/2016 10. Iron deposition within the liver noted on MRI 04/20/2012. The ferritin level was normal on 04/17/2013. Increased iron deposition noted on the left liver resection 01/15/2014 11. Pain/bleeding at the anus reported when here 04/24/2013. Status post evaluation by Dr. NLucia Gaskinswith findings of an anal fissure. 12. Right face cystic lesion 13. Status post Port-A-Cath placement 08/14/2014 14. Delayed nausea following FOLFIRI, prophylactic Decadron added with cycle 2 with improvement. 15. Chest  CT 10/18/2014 with new left lower lobe bronchopneumonia. Levaquin initiated. Resolved on CT  12/14/2014 16. Gum pain-mucositis?, Gingivitis?-Improved with discontinuation of Avastin 17. Pain/tenderness, mild erythema and full appearance over the maxillary sinuses. Maxillofacial CT 01/30/2015 with mild to moderate bilateral maxillary and sphenoid sinus inflammatory changes. Multifocal right maxillary dental periapical lucency. Status post a right upper tooth extraction with resolution of the pain. 18. Admission 10/21/2016 with nausea/vomiting-x-ray evidence for a partial small bowel obstruction, clinical improvement with bowel rest, recurrent obstructive symptoms during the week of 11/30/2016    Disposition:  Bryan Fields has metastatic colon cancer. He completed one cycle of salvage therapy with Lonsurf. He has intermittent obstructive symptoms likely secondary to carcinomatosis versus adhesions. He discussed the poor prognosis with Dr. Aleatha Borer last week. We discussed the prognosis again today. He does not wish to consider Hospice care at present. He feels well today.  He will begin a trial of Reglan for nausea. He will continue the current pain regimen. He will return for a scheduled lab visit on 12/11/2016. The plan is to begin cycle 2 Lonsurf on 12/14/2016.  He will return for an office visit and CBC on 12/25/2016. He plans to leave for vacation on 12/26/2016. I recommended he continue following a mechanical soft diet and begin a clear liquid diet if he develops obstructive symptoms.  25 minutes were spent with the patient today. The majority of the time was used for counseling and coordination of care.    Donneta Romberg, MD  12/07/2016  4:10 PM

## 2016-12-08 ENCOUNTER — Encounter (HOSPITAL_COMMUNITY): Payer: Self-pay | Admitting: Emergency Medicine

## 2016-12-08 ENCOUNTER — Inpatient Hospital Stay (HOSPITAL_COMMUNITY): Payer: 59

## 2016-12-08 ENCOUNTER — Inpatient Hospital Stay (HOSPITAL_COMMUNITY)
Admission: EM | Admit: 2016-12-08 | Discharge: 2016-12-18 | DRG: 872 | Disposition: A | Discharge status: Home / Self Care | Payer: 59 | Attending: Family Medicine | Admitting: Family Medicine

## 2016-12-08 ENCOUNTER — Telehealth: Payer: Self-pay | Admitting: *Deleted

## 2016-12-08 ENCOUNTER — Emergency Department (HOSPITAL_COMMUNITY): Payer: 59

## 2016-12-08 DIAGNOSIS — E876 Hypokalemia: Secondary | ICD-10-CM | POA: Diagnosis present

## 2016-12-08 DIAGNOSIS — E7800 Pure hypercholesterolemia, unspecified: Secondary | ICD-10-CM | POA: Diagnosis present

## 2016-12-08 DIAGNOSIS — R110 Nausea: Secondary | ICD-10-CM | POA: Diagnosis not present

## 2016-12-08 DIAGNOSIS — E871 Hypo-osmolality and hyponatremia: Secondary | ICD-10-CM | POA: Diagnosis present

## 2016-12-08 DIAGNOSIS — G893 Neoplasm related pain (acute) (chronic): Secondary | ICD-10-CM

## 2016-12-08 DIAGNOSIS — B961 Klebsiella pneumoniae [K. pneumoniae] as the cause of diseases classified elsewhere: Secondary | ICD-10-CM | POA: Diagnosis present

## 2016-12-08 DIAGNOSIS — N400 Enlarged prostate without lower urinary tract symptoms: Secondary | ICD-10-CM | POA: Diagnosis present

## 2016-12-08 DIAGNOSIS — R197 Diarrhea, unspecified: Secondary | ICD-10-CM

## 2016-12-08 DIAGNOSIS — G622 Polyneuropathy due to other toxic agents: Secondary | ICD-10-CM | POA: Diagnosis present

## 2016-12-08 DIAGNOSIS — D61818 Other pancytopenia: Secondary | ICD-10-CM | POA: Diagnosis present

## 2016-12-08 DIAGNOSIS — D709 Neutropenia, unspecified: Secondary | ICD-10-CM | POA: Diagnosis present

## 2016-12-08 DIAGNOSIS — R7881 Bacteremia: Secondary | ICD-10-CM | POA: Diagnosis not present

## 2016-12-08 DIAGNOSIS — K831 Obstruction of bile duct: Secondary | ICD-10-CM

## 2016-12-08 DIAGNOSIS — D6959 Other secondary thrombocytopenia: Secondary | ICD-10-CM | POA: Diagnosis not present

## 2016-12-08 DIAGNOSIS — C801 Malignant (primary) neoplasm, unspecified: Secondary | ICD-10-CM

## 2016-12-08 DIAGNOSIS — C799 Secondary malignant neoplasm of unspecified site: Secondary | ICD-10-CM

## 2016-12-08 DIAGNOSIS — R17 Unspecified jaundice: Secondary | ICD-10-CM | POA: Diagnosis not present

## 2016-12-08 DIAGNOSIS — Z8249 Family history of ischemic heart disease and other diseases of the circulatory system: Secondary | ICD-10-CM

## 2016-12-08 DIAGNOSIS — E861 Hypovolemia: Secondary | ICD-10-CM | POA: Diagnosis present

## 2016-12-08 DIAGNOSIS — N529 Male erectile dysfunction, unspecified: Secondary | ICD-10-CM | POA: Diagnosis present

## 2016-12-08 DIAGNOSIS — A4159 Other Gram-negative sepsis: Principal | ICD-10-CM | POA: Diagnosis present

## 2016-12-08 DIAGNOSIS — D638 Anemia in other chronic diseases classified elsewhere: Secondary | ICD-10-CM | POA: Diagnosis present

## 2016-12-08 DIAGNOSIS — R7989 Other specified abnormal findings of blood chemistry: Secondary | ICD-10-CM | POA: Diagnosis not present

## 2016-12-08 DIAGNOSIS — A4189 Other specified sepsis: Secondary | ICD-10-CM | POA: Diagnosis not present

## 2016-12-08 DIAGNOSIS — C787 Secondary malignant neoplasm of liver and intrahepatic bile duct: Secondary | ICD-10-CM | POA: Diagnosis present

## 2016-12-08 DIAGNOSIS — G8929 Other chronic pain: Secondary | ICD-10-CM | POA: Diagnosis present

## 2016-12-08 DIAGNOSIS — N390 Urinary tract infection, site not specified: Secondary | ICD-10-CM | POA: Diagnosis present

## 2016-12-08 DIAGNOSIS — I10 Essential (primary) hypertension: Secondary | ICD-10-CM | POA: Diagnosis present

## 2016-12-08 DIAGNOSIS — R5081 Fever presenting with conditions classified elsewhere: Secondary | ICD-10-CM | POA: Diagnosis present

## 2016-12-08 DIAGNOSIS — R112 Nausea with vomiting, unspecified: Secondary | ICD-10-CM | POA: Diagnosis present

## 2016-12-08 DIAGNOSIS — Z79899 Other long term (current) drug therapy: Secondary | ICD-10-CM

## 2016-12-08 DIAGNOSIS — K828 Other specified diseases of gallbladder: Secondary | ICD-10-CM | POA: Diagnosis present

## 2016-12-08 DIAGNOSIS — R945 Abnormal results of liver function studies: Secondary | ICD-10-CM | POA: Diagnosis present

## 2016-12-08 DIAGNOSIS — D701 Agranulocytosis secondary to cancer chemotherapy: Secondary | ICD-10-CM | POA: Diagnosis not present

## 2016-12-08 DIAGNOSIS — C182 Malignant neoplasm of ascending colon: Secondary | ICD-10-CM | POA: Diagnosis not present

## 2016-12-08 DIAGNOSIS — Z86010 Personal history of colonic polyps: Secondary | ICD-10-CM

## 2016-12-08 DIAGNOSIS — K810 Acute cholecystitis: Secondary | ICD-10-CM | POA: Diagnosis present

## 2016-12-08 DIAGNOSIS — Z9049 Acquired absence of other specified parts of digestive tract: Secondary | ICD-10-CM

## 2016-12-08 DIAGNOSIS — D6481 Anemia due to antineoplastic chemotherapy: Secondary | ICD-10-CM | POA: Diagnosis present

## 2016-12-08 DIAGNOSIS — R509 Fever, unspecified: Secondary | ICD-10-CM

## 2016-12-08 DIAGNOSIS — K219 Gastro-esophageal reflux disease without esophagitis: Secondary | ICD-10-CM | POA: Diagnosis present

## 2016-12-08 DIAGNOSIS — T451X5A Adverse effect of antineoplastic and immunosuppressive drugs, initial encounter: Secondary | ICD-10-CM | POA: Diagnosis present

## 2016-12-08 DIAGNOSIS — Z8701 Personal history of pneumonia (recurrent): Secondary | ICD-10-CM

## 2016-12-08 DIAGNOSIS — C189 Malignant neoplasm of colon, unspecified: Secondary | ICD-10-CM

## 2016-12-08 DIAGNOSIS — Z87891 Personal history of nicotine dependence: Secondary | ICD-10-CM

## 2016-12-08 DIAGNOSIS — Z888 Allergy status to other drugs, medicaments and biological substances status: Secondary | ICD-10-CM

## 2016-12-08 DIAGNOSIS — R748 Abnormal levels of other serum enzymes: Secondary | ICD-10-CM

## 2016-12-08 DIAGNOSIS — Z808 Family history of malignant neoplasm of other organs or systems: Secondary | ICD-10-CM

## 2016-12-08 DIAGNOSIS — E785 Hyperlipidemia, unspecified: Secondary | ICD-10-CM | POA: Diagnosis present

## 2016-12-08 LAB — URINALYSIS, ROUTINE W REFLEX MICROSCOPIC
Bacteria, UA: NONE SEEN
Glucose, UA: NEGATIVE mg/dL
Hgb urine dipstick: NEGATIVE
KETONES UR: NEGATIVE mg/dL
Leukocytes, UA: NEGATIVE
Nitrite: NEGATIVE
PH: 5 (ref 5.0–8.0)
PROTEIN: 100 mg/dL — AB
Specific Gravity, Urine: 1.035 — ABNORMAL HIGH (ref 1.005–1.030)

## 2016-12-08 LAB — COMPREHENSIVE METABOLIC PANEL
ALT: 164 U/L — AB (ref 17–63)
AST: 120 U/L — AB (ref 15–41)
Albumin: 3.2 g/dL — ABNORMAL LOW (ref 3.5–5.0)
Alkaline Phosphatase: 165 U/L — ABNORMAL HIGH (ref 38–126)
Anion gap: 6 (ref 5–15)
BUN: 17 mg/dL (ref 6–20)
CHLORIDE: 103 mmol/L (ref 101–111)
CO2: 25 mmol/L (ref 22–32)
CREATININE: 1.15 mg/dL (ref 0.61–1.24)
Calcium: 8.4 mg/dL — ABNORMAL LOW (ref 8.9–10.3)
GFR calc Af Amer: >60 mL/min (ref >60)
GFR calc non Af Amer: >60 mL/min (ref >60)
GLUCOSE: 120 mg/dL — AB (ref 65–99)
Potassium: 3.3 mmol/L — ABNORMAL LOW (ref 3.5–5.1)
SODIUM: 134 mmol/L — AB (ref 135–145)
Total Bilirubin: 5.8 mg/dL — ABNORMAL HIGH (ref 0.3–1.2)
Total Protein: 6.8 g/dL (ref 6.5–8.1)

## 2016-12-08 LAB — CBC WITH DIFFERENTIAL/PLATELET
Basophils Absolute: 0 10*3/uL (ref 0.0–0.1)
Basophils Relative: 1 %
EOS ABS: 0 10*3/uL (ref 0.0–0.7)
EOS PCT: 0 %
HCT: 32.1 % — ABNORMAL LOW (ref 39.0–52.0)
HEMOGLOBIN: 11.1 g/dL — AB (ref 13.0–17.0)
LYMPHS ABS: 0.3 10*3/uL — AB (ref 0.7–4.0)
Lymphocytes Relative: 15 %
MCH: 31.8 pg (ref 26.0–34.0)
MCHC: 34.6 g/dL (ref 30.0–36.0)
MCV: 92 fL (ref 78.0–100.0)
MONOS PCT: 13 %
Monocytes Absolute: 0.2 10*3/uL (ref 0.1–1.0)
Neutro Abs: 1.4 10*3/uL — ABNORMAL LOW (ref 1.7–7.7)
Neutrophils Relative %: 71 %
Platelets: 130 10*3/uL — ABNORMAL LOW (ref 150–400)
RBC: 3.49 MIL/uL — ABNORMAL LOW (ref 4.22–5.81)
RDW: 14.2 % (ref 11.5–15.5)
WBC: 1.9 10*3/uL — ABNORMAL LOW (ref 4.0–10.5)

## 2016-12-08 LAB — I-STAT CG4 LACTIC ACID, ED: LACTIC ACID, VENOUS: 1.35 mmol/L (ref 0.5–1.9)

## 2016-12-08 MED ORDER — METOCLOPRAMIDE HCL 10 MG PO TABS
10.0000 mg | ORAL_TABLET | Freq: Every day | ORAL | Status: DC
  Administered 2016-12-09 – 2016-12-12 (×4): 10 mg via ORAL
  Filled 2016-12-08 (×4): qty 1

## 2016-12-08 MED ORDER — VANCOMYCIN HCL IN DEXTROSE 1-5 GM/200ML-% IV SOLN
1000.0000 mg | Freq: Once | INTRAVENOUS | Status: AC
  Administered 2016-12-08: 1000 mg via INTRAVENOUS
  Filled 2016-12-08: qty 200

## 2016-12-08 MED ORDER — ACETAMINOPHEN 325 MG PO TABS
650.0000 mg | ORAL_TABLET | Freq: Once | ORAL | Status: AC
  Administered 2016-12-08: 650 mg via ORAL
  Filled 2016-12-08: qty 2

## 2016-12-08 MED ORDER — HYDROMORPHONE HCL 2 MG PO TABS
2.0000 mg | ORAL_TABLET | ORAL | Status: DC | PRN
  Administered 2016-12-08 – 2016-12-13 (×16): 2 mg via ORAL
  Administered 2016-12-13: 4 mg via ORAL
  Administered 2016-12-13 – 2016-12-15 (×6): 2 mg via ORAL
  Administered 2016-12-15: 4 mg via ORAL
  Administered 2016-12-15: 2 mg via ORAL
  Filled 2016-12-08 (×5): qty 1
  Filled 2016-12-08: qty 2
  Filled 2016-12-08 (×2): qty 1
  Filled 2016-12-08 (×2): qty 2
  Filled 2016-12-08 (×12): qty 1
  Filled 2016-12-08 (×2): qty 2
  Filled 2016-12-08 (×4): qty 1

## 2016-12-08 MED ORDER — ENOXAPARIN SODIUM 40 MG/0.4ML ~~LOC~~ SOLN
40.0000 mg | SUBCUTANEOUS | Status: DC
  Administered 2016-12-08 – 2016-12-16 (×8): 40 mg via SUBCUTANEOUS
  Filled 2016-12-08 (×9): qty 0.4

## 2016-12-08 MED ORDER — ATENOLOL 50 MG PO TABS
50.0000 mg | ORAL_TABLET | Freq: Every day | ORAL | Status: DC
  Administered 2016-12-09: 50 mg via ORAL
  Filled 2016-12-08 (×2): qty 1

## 2016-12-08 MED ORDER — FAMOTIDINE 20 MG PO TABS
20.0000 mg | ORAL_TABLET | Freq: Every day | ORAL | Status: DC
  Administered 2016-12-09 – 2016-12-18 (×9): 20 mg via ORAL
  Filled 2016-12-08 (×9): qty 1

## 2016-12-08 MED ORDER — ONDANSETRON HCL 4 MG/2ML IJ SOLN
4.0000 mg | Freq: Four times a day (QID) | INTRAMUSCULAR | Status: DC | PRN
  Administered 2016-12-09 – 2016-12-16 (×3): 4 mg via INTRAVENOUS
  Filled 2016-12-08 (×2): qty 2

## 2016-12-08 MED ORDER — SODIUM CHLORIDE 0.9 % IV SOLN
1000.0000 mL | INTRAVENOUS | Status: AC
  Administered 2016-12-08: 1000 mL via INTRAVENOUS

## 2016-12-08 MED ORDER — SODIUM CHLORIDE 0.9 % IV SOLN
1000.0000 mL | INTRAVENOUS | Status: DC
  Administered 2016-12-08: 1000 mL via INTRAVENOUS

## 2016-12-08 MED ORDER — PIPERACILLIN-TAZOBACTAM 3.375 G IVPB 30 MIN
3.3750 g | Freq: Once | INTRAVENOUS | Status: AC
  Administered 2016-12-08: 3.375 g via INTRAVENOUS
  Filled 2016-12-08: qty 50

## 2016-12-08 MED ORDER — ACETAMINOPHEN 325 MG PO TABS
650.0000 mg | ORAL_TABLET | Freq: Four times a day (QID) | ORAL | Status: DC | PRN
  Administered 2016-12-09 – 2016-12-13 (×4): 650 mg via ORAL
  Filled 2016-12-08 (×4): qty 2

## 2016-12-08 MED ORDER — TIZANIDINE HCL 4 MG PO TABS
4.0000 mg | ORAL_TABLET | Freq: Four times a day (QID) | ORAL | Status: DC | PRN
  Administered 2016-12-09 – 2016-12-17 (×3): 4 mg via ORAL
  Filled 2016-12-08 (×3): qty 1

## 2016-12-08 MED ORDER — ONDANSETRON HCL 4 MG PO TABS
4.0000 mg | ORAL_TABLET | Freq: Four times a day (QID) | ORAL | Status: DC | PRN
  Administered 2016-12-15 – 2016-12-18 (×2): 4 mg via ORAL
  Filled 2016-12-08 (×3): qty 1

## 2016-12-08 MED ORDER — HYDROMORPHONE HCL 2 MG PO TABS
2.0000 mg | ORAL_TABLET | Freq: Once | ORAL | Status: AC
  Administered 2016-12-08: 2 mg via ORAL
  Filled 2016-12-08: qty 1

## 2016-12-08 MED ORDER — VITAMIN B-12 1000 MCG PO TABS
2500.0000 ug | ORAL_TABLET | Freq: Every day | ORAL | Status: DC
  Administered 2016-12-09 – 2016-12-18 (×9): 2500 ug via ORAL
  Filled 2016-12-08 (×9): qty 3

## 2016-12-08 MED ORDER — POTASSIUM CHLORIDE CRYS ER 20 MEQ PO TBCR
20.0000 meq | EXTENDED_RELEASE_TABLET | Freq: Two times a day (BID) | ORAL | Status: DC
  Administered 2016-12-08 – 2016-12-10 (×5): 20 meq via ORAL
  Filled 2016-12-08 (×5): qty 1

## 2016-12-08 MED ORDER — DIAZEPAM 5 MG PO TABS: 5.0000 mg | ORAL_TABLET | Freq: Three times a day (TID) | ORAL | Status: DC | PRN

## 2016-12-08 MED ORDER — PIPERACILLIN-TAZOBACTAM 3.375 G IVPB
3.3750 g | Freq: Three times a day (TID) | INTRAVENOUS | Status: DC
  Administered 2016-12-09 (×2): 3.375 g via INTRAVENOUS
  Filled 2016-12-08 (×2): qty 50

## 2016-12-08 MED ORDER — PANTOPRAZOLE SODIUM 40 MG PO TBEC
40.0000 mg | DELAYED_RELEASE_TABLET | Freq: Every day | ORAL | Status: DC
  Administered 2016-12-09 – 2016-12-18 (×9): 40 mg via ORAL
  Filled 2016-12-08 (×9): qty 1

## 2016-12-08 NOTE — ED Notes (Signed)
Both sets of cultures drawn prior to antibiotic administration.

## 2016-12-08 NOTE — ED Notes (Signed)
Pt ambulated to restroom. Attempting urine sample at this time. Will start vancomycin upon his return.

## 2016-12-08 NOTE — ED Notes (Signed)
Transport en route.  
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
## 2016-12-08 NOTE — ED Triage Notes (Signed)
Patient c/o fever and N/V/D since yesterday. Hx colon cancer. Receiving treatment every 3 weeks. Temp 102.1 in triage. Reports last dose of tylenol at 15:30. Denies abdominal pain, chest pain, and SOB.

## 2016-12-08 NOTE — ED Notes (Signed)
Ultrasound at bedside. Report given, will get patient upstairs after ultrasound finishes.

## 2016-12-08 NOTE — Progress Notes (Signed)
Pharmacy Antibiotic Note  Bryan Fields is a 56 y.o. male admitted on 12/08/2016 with febrile neutropenia.  Pharmacy has been consulted for zosyn dosing. Pt with colon cancer.  Neutropenic with WBC 1.9, Tmax 102.1, creat 1.15, Wt 105.2 kg. vanc 1 gm and zosyn 3.375 gm given in ED.   Plan: zosyn 3.375 gm IV x 1 dose over 30 minutes in ED Zosyn 3.375g IV q8h (4 hour infusion).  Height: 5\' 11"  (180.3 cm) Weight: 232 lb (105.2 kg) IBW/kg (Calculated) : 75.3  Temp (24hrs), Avg:100.5 F (38.1 C), Min:99.1 F (37.3 C), Max:102.1 F (38.9 C)   Recent Labs Lab 12/08/16 1726 12/08/16 1737  WBC 1.9*  --   CREATININE 1.15  --   LATICACIDVEN  --  1.35    Estimated Creatinine Clearance: 86.5 mL/min (by C-G formula based on SCr of 1.15 mg/dL).    Allergies  Allergen Reactions  . Other     Chemo therapy drug: Oxali Caused itching and redness.    Thank you for allowing pharmacy to be a part of this patient's care.  Eudelia Bunch, Pharm.D. [phone] 12/08/2016 8:25 PM
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
## 2016-12-08 NOTE — ED Notes (Signed)
Bed: WA21 Expected date:  Expected time:  Means of arrival:  Comments: Triage 2 

## 2016-12-08 NOTE — Telephone Encounter (Signed)
Wife called requesting a call back from nurse.  Spoke with wife and was informed that pt developed temp of 102.3 early this am.  Pt took Tylenol which helped with decreased temp.  Pt had diarrhea x 2 ; took Imodium with some relief.  However, fever came back this afternoon  102 again.   Instructed wife to take pt to ER for further evaluation since it is late in the afternoon now.   Dr. Benay Spice notified of above info. Pt's    Phone      [phone].

## 2016-12-09 DIAGNOSIS — D709 Neutropenia, unspecified: Secondary | ICD-10-CM

## 2016-12-09 DIAGNOSIS — C182 Malignant neoplasm of ascending colon: Secondary | ICD-10-CM

## 2016-12-09 DIAGNOSIS — R5081 Fever presenting with conditions classified elsewhere: Secondary | ICD-10-CM

## 2016-12-09 DIAGNOSIS — C787 Secondary malignant neoplasm of liver and intrahepatic bile duct: Secondary | ICD-10-CM

## 2016-12-09 DIAGNOSIS — R110 Nausea: Secondary | ICD-10-CM

## 2016-12-09 LAB — BLOOD CULTURE ID PANEL (REFLEXED)
ACINETOBACTER BAUMANNII: NOT DETECTED
CANDIDA PARAPSILOSIS: NOT DETECTED
CANDIDA TROPICALIS: NOT DETECTED
CARBAPENEM RESISTANCE: NOT DETECTED
Candida albicans: NOT DETECTED
Candida glabrata: NOT DETECTED
Candida krusei: NOT DETECTED
ENTEROBACTER CLOACAE COMPLEX: NOT DETECTED
Enterobacteriaceae species: DETECTED — AB
Enterococcus species: NOT DETECTED
Escherichia coli: NOT DETECTED
HAEMOPHILUS INFLUENZAE: NOT DETECTED
KLEBSIELLA PNEUMONIAE: DETECTED — AB
Klebsiella oxytoca: NOT DETECTED
Listeria monocytogenes: NOT DETECTED
NEISSERIA MENINGITIDIS: NOT DETECTED
PROTEUS SPECIES: NOT DETECTED
Pseudomonas aeruginosa: NOT DETECTED
SERRATIA MARCESCENS: NOT DETECTED
STAPHYLOCOCCUS AUREUS BCID: NOT DETECTED
STAPHYLOCOCCUS SPECIES: NOT DETECTED
STREPTOCOCCUS AGALACTIAE: NOT DETECTED
STREPTOCOCCUS SPECIES: NOT DETECTED
Streptococcus pneumoniae: NOT DETECTED
Streptococcus pyogenes: NOT DETECTED

## 2016-12-09 LAB — COMPREHENSIVE METABOLIC PANEL
ALT: 114 U/L — AB (ref 17–63)
AST: 64 U/L — AB (ref 15–41)
Albumin: 2.6 g/dL — ABNORMAL LOW (ref 3.5–5.0)
Alkaline Phosphatase: 131 U/L — ABNORMAL HIGH (ref 38–126)
Anion gap: 9 (ref 5–15)
BUN: 16 mg/dL (ref 6–20)
CHLORIDE: 104 mmol/L (ref 101–111)
CO2: 24 mmol/L (ref 22–32)
CREATININE: 1.07 mg/dL (ref 0.61–1.24)
Calcium: 7.8 mg/dL — ABNORMAL LOW (ref 8.9–10.3)
GFR calc Af Amer: >60 mL/min (ref >60)
GLUCOSE: 101 mg/dL — AB (ref 65–99)
POTASSIUM: 3.1 mmol/L — AB (ref 3.5–5.1)
SODIUM: 137 mmol/L (ref 135–145)
Total Bilirubin: 3.9 mg/dL — ABNORMAL HIGH (ref 0.3–1.2)
Total Protein: 5.5 g/dL — ABNORMAL LOW (ref 6.5–8.1)

## 2016-12-09 LAB — CBC WITH DIFFERENTIAL/PLATELET
BASOS PCT: 1 %
Basophils Absolute: 0 10*3/uL (ref 0.0–0.1)
EOS PCT: 1 %
Eosinophils Absolute: 0 10*3/uL (ref 0.0–0.7)
HCT: 28.8 % — ABNORMAL LOW (ref 39.0–52.0)
Hemoglobin: 9.9 g/dL — ABNORMAL LOW (ref 13.0–17.0)
LYMPHS ABS: 0.5 10*3/uL — AB (ref 0.7–4.0)
Lymphocytes Relative: 26 %
MCH: 31.3 pg (ref 26.0–34.0)
MCHC: 34.4 g/dL (ref 30.0–36.0)
MCV: 91.1 fL (ref 78.0–100.0)
MONO ABS: 0.4 10*3/uL (ref 0.1–1.0)
MONOS PCT: 18 %
Neutro Abs: 1.2 10*3/uL — ABNORMAL LOW (ref 1.7–7.7)
Neutrophils Relative %: 54 %
PLATELETS: 116 10*3/uL — AB (ref 150–400)
RBC: 3.16 MIL/uL — AB (ref 4.22–5.81)
RDW: 14.3 % (ref 11.5–15.5)
WBC: 2.1 10*3/uL — AB (ref 4.0–10.5)

## 2016-12-09 LAB — GLUCOSE, CAPILLARY: GLUCOSE-CAPILLARY: 119 mg/dL — AB (ref 65–99)

## 2016-12-09 MED ORDER — POTASSIUM CHLORIDE CRYS ER 20 MEQ PO TBCR
40.0000 meq | EXTENDED_RELEASE_TABLET | Freq: Once | ORAL | Status: AC
  Administered 2016-12-09: 40 meq via ORAL
  Filled 2016-12-09: qty 2

## 2016-12-09 MED ORDER — METRONIDAZOLE 500 MG PO TABS
500.0000 mg | ORAL_TABLET | Freq: Three times a day (TID) | ORAL | Status: DC
  Administered 2016-12-09 – 2016-12-12 (×8): 500 mg via ORAL
  Filled 2016-12-09 (×8): qty 1

## 2016-12-09 MED ORDER — IBUPROFEN 200 MG PO TABS
400.0000 mg | ORAL_TABLET | Freq: Once | ORAL | Status: AC
  Administered 2016-12-09: 400 mg via ORAL
  Filled 2016-12-09: qty 2

## 2016-12-09 MED ORDER — SODIUM CHLORIDE 0.9 % IV BOLUS (SEPSIS)
500.0000 mL | Freq: Once | INTRAVENOUS | Status: AC
  Administered 2016-12-09: 500 mL via INTRAVENOUS

## 2016-12-09 MED ORDER — DEXTROSE 5 % IV SOLN
2.0000 g | INTRAVENOUS | Status: DC
  Administered 2016-12-09 – 2016-12-11 (×3): 2 g via INTRAVENOUS
  Filled 2016-12-09 (×4): qty 2

## 2016-12-09 NOTE — Progress Notes (Signed)
PHARMACY - PHYSICIAN COMMUNICATION CRITICAL VALUE ALERT - BLOOD CULTURE IDENTIFICATION (BCID)  Results for orders placed or performed during the hospital encounter of 12/08/16  Blood Culture ID Panel (Reflexed) (Collected: 12/08/2016  7:02 PM)  Result Value Ref Range   Enterococcus species NOT DETECTED NOT DETECTED   Listeria monocytogenes NOT DETECTED NOT DETECTED   Staphylococcus species NOT DETECTED NOT DETECTED   Staphylococcus aureus NOT DETECTED NOT DETECTED   Streptococcus species NOT DETECTED NOT DETECTED   Streptococcus agalactiae NOT DETECTED NOT DETECTED   Streptococcus pneumoniae NOT DETECTED NOT DETECTED   Streptococcus pyogenes NOT DETECTED NOT DETECTED   Acinetobacter baumannii NOT DETECTED NOT DETECTED   Enterobacteriaceae species DETECTED (A) NOT DETECTED   Enterobacter cloacae complex NOT DETECTED NOT DETECTED   Escherichia coli NOT DETECTED NOT DETECTED   Klebsiella oxytoca NOT DETECTED NOT DETECTED   Klebsiella pneumoniae DETECTED (A) NOT DETECTED   Proteus species NOT DETECTED NOT DETECTED   Serratia marcescens NOT DETECTED NOT DETECTED   Carbapenem resistance NOT DETECTED NOT DETECTED   Haemophilus influenzae NOT DETECTED NOT DETECTED   Neisseria meningitidis NOT DETECTED NOT DETECTED   Pseudomonas aeruginosa NOT DETECTED NOT DETECTED   Candida albicans NOT DETECTED NOT DETECTED   Candida glabrata NOT DETECTED NOT DETECTED   Candida krusei NOT DETECTED NOT DETECTED   Candida parapsilosis NOT DETECTED NOT DETECTED   Candida tropicalis NOT DETECTED NOT DETECTED    Name of physician (or Provider) ContactedCharlies Silvers  Changes to prescribed antibiotics required: change to ceftriaxone (+ flagyl for intra-abdominal coverage)  Clovis Riley 12/09/2016  10:32 AM

## 2016-12-09 NOTE — Progress Notes (Signed)
Patient ID: JAMAREE HOSIER, male   DOB: Jul 29, 1960, 56 y.o.   MRN: [id number]  PROGRESS NOTE    TAG WURTZ  TUU:[id number] DOB: [birth year] DOA: 12/08/2016  PCP: Ria Bush, MD   Brief Narrative:  56 year old male with history of metastatic colon cancer, scheduled to begin cycle 2 salvage chemotherapy with Lonsurf 12/14/16). Pt presented to ED with sudden onset fever, chills and diarrhea started 1 day prior to the admission. On admission, pt ANC was 1,400. CXR and UA were not suggestive of an acute infectious process. He was started on empiric vanco and zosyn while awaiting culture results and stool studies. Stool studies have not been collected as his diarrhea has stopped in hospital.   Assessment & Plan:   Principal Problem:   Febrile neutropenia ([place]) / Enterobacter bacteremia - Likely due to malignancy and enterobacter bacteremia - Blood cx on admission grew enterobacter species - Continue rocephin and flagyl - Follow up daily CBC  Active Problems:   Sepsis due to enterobacter species - Sepsis criteria met on admission with fever, tachypnea, neutropenia - Source of infection is enterobacter bacteremia - Continue rocephin and flagyl    Essential hypertension - Continue atenolol       Elevated LFTs / Metastatic colon cancer to liver ([place]) - Has biliary stent in place    Pancytopenia ([place]) - Due to malignancy - Monitor daily CBC    Nausea vomiting and diarrhea - Likely due to bacteremia, sepsis - Feels better this am - Added Reglan scheduled for better N/V control - Stool studies not collected as diarrhea stopped    Hypokalemia - Due to GI losses - Supplemented - FOllow up BMP in am     DVT prophylaxis: Lovenox subQ Code Status: full code  Family Communication: no family at the bedside Disposition Plan: home once sepsis resolves   Consultants:   Oncology  Procedures:   None  Antimicrobials:   Vanco and zosyn 7/2 --> 7/4  Rocephin and  flagyl 7/4 -->   Subjective: No diarrhea.  Objective: Vitals:   12/09/16 0032 12/09/16 0150 12/09/16 0239 12/09/16 0605  BP:    (!) 102/55  Pulse:    71  Resp:    18  Temp: (!) 101.5 F (38.6 C) (!) 102.8 F (39.3 C) (!) 100.9 F (38.3 C) 98.1 F (36.7 C)  TempSrc: Oral Oral Oral Oral  SpO2:    98%  Weight:    110.5 kg (243 lb 8 oz)  Height:        Intake/Output Summary (Last 24 hours) at 12/09/16 0741 Last data filed at 12/09/16 0300  Gross per 24 hour  Intake              510 ml  Output                0 ml  Net              510 ml   Filed Weights   12/08/16 1653 12/08/16 2158 12/09/16 0605  Weight: 105.2 kg (232 lb) 110.5 kg (243 lb 9.7 oz) 110.5 kg (243 lb 8 oz)    Examination:  General exam: Appears calm and comfortable  Respiratory system: Clear to auscultation. Respiratory effort normal. Cardiovascular system: S1 & S2 heard, RRR. No JVD, murmurs, rubs, gallops or clicks. No pedal edema. Gastrointestinal system: Abdomen is nondistended, (+) BS Central nervous system: Alert and oriented. No focal neurological deficits. Extremities: Symmetric 5 x 5 power. Amputation left  toes Skin: No rashes, lesions or ulcers Psychiatry: Judgement and insight appear normal. Mood & affect appropriate.   Data Reviewed: I have personally reviewed following labs and imaging studies  CBC:  Recent Labs Lab 12/08/16 1726 12/09/16 0500  WBC 1.9* 2.1*  NEUTROABS 1.4* 1.2*  HGB 11.1* 9.9*  HCT 32.1* 28.8*  MCV 92.0 91.1  PLT 130* 283*   Basic Metabolic Panel:  Recent Labs Lab 12/08/16 1726 12/09/16 0500  NA 134* 137  K 3.3* 3.1*  CL 103 104  CO2 25 24  GLUCOSE 120* 101*  BUN 17 16  CREATININE 1.15 1.07  CALCIUM 8.4* 7.8*   GFR: Estimated Creatinine Clearance: 95.2 mL/min (by C-G formula based on SCr of 1.07 mg/dL). Liver Function Tests:  Recent Labs Lab 12/08/16 1726 12/09/16 0500  AST 120* 64*  ALT 164* 114*  ALKPHOS 165* 131*  BILITOT 5.8* 3.9*  PROT  6.8 5.5*  ALBUMIN 3.2* 2.6*   No results for input(s): LIPASE, AMYLASE in the last 168 hours. No results for input(s): AMMONIA in the last 168 hours. Coagulation Profile: No results for input(s): INR, PROTIME in the last 168 hours. Cardiac Enzymes: No results for input(s): CKTOTAL, CKMB, CKMBINDEX, TROPONINI in the last 168 hours. BNP (last 3 results) No results for input(s): PROBNP in the last 8760 hours. HbA1C: No results for input(s): HGBA1C in the last 72 hours. CBG: No results for input(s): GLUCAP in the last 168 hours. Lipid Profile: No results for input(s): CHOL, HDL, LDLCALC, TRIG, CHOLHDL, LDLDIRECT in the last 72 hours. Thyroid Function Tests: No results for input(s): TSH, T4TOTAL, FREET4, T3FREE, THYROIDAB in the last 72 hours. Anemia Panel: No results for input(s): VITAMINB12, FOLATE, FERRITIN, TIBC, IRON, RETICCTPCT in the last 72 hours. Urine analysis:    Component Value Date/Time   COLORURINE AMBER (A) 12/08/2016 1708   APPEARANCEUR HAZY (A) 12/08/2016 1708   LABSPEC 1.035 (H) 12/08/2016 1708   LABSPEC 1.020 03/18/2015 0847   PHURINE 5.0 12/08/2016 1708   GLUCOSEU NEGATIVE 12/08/2016 1708   GLUCOSEU Negative 03/18/2015 0847   HGBUR NEGATIVE 12/08/2016 1708   HGBUR small 04/01/2010 0848   BILIRUBINUR MODERATE (A) 12/08/2016 1708   BILIRUBINUR Negative 03/18/2015 0847   KETONESUR NEGATIVE 12/08/2016 1708   PROTEINUR 100 (A) 12/08/2016 1708   UROBILINOGEN 0.2 03/18/2015 0847   NITRITE NEGATIVE 12/08/2016 1708   LEUKOCYTESUR NEGATIVE 12/08/2016 1708   LEUKOCYTESUR Negative 03/18/2015 0847   Sepsis Labs: _0 (procalcitonin:4,lacticidven:4)   )No results found for this or any previous visit (from the past 240 hour(s)).    Radiology Studies: Dg Abd Acute W/chest  Result Date: 12/08/2016 CLINICAL DATA:  Fever, nausea, vomiting and diarrhea since yesterday. History of metastatic colon carcinoma. EXAM: DG ABDOMEN ACUTE W/ 1V CHEST COMPARISON:  Plain films  the abdomen 10/29/2016. CT chest, abdomen and pelvis 05/25/2016. FINDINGS: Single-view of the chest demonstrates a right IJ approach Port-A-Cath in place. Minimal atelectasis is seen in the left lung base. The lungs are otherwise clear. Heart size is normal. No pneumothorax or pleural effusion. Two views of the abdomen show no free intraperitoneal air. The bowel gas pattern is nonobstructive. Multiple surgical clips project in the upper abdomen. Common bile duct stent is in place. IMPRESSION: No acute finding chest or abdomen. Electronically Signed   By: Inge Rise M.D.   On: 12/08/2016 18:39   US Abdomen Limited Ruq  Result Date: 12/08/2016 CLINICAL DATA:  Biliary obstruction due to cancer. Biliary stent placed on 10/15/2016. LFT and jaundice has  normalized with recurrence of fever, nausea and vomiting. EXAM: ULTRASOUND ABDOMEN LIMITED RIGHT UPPER QUADRANT COMPARISON:  10/02/2016 CT FINDINGS: Gallbladder: Minimal debris, sludge or calculi along the dependent aspect of the gallbladder without wall thickening visualized. No sonographic Murphy sign noted by sonographer. Common bile duct: Diameter: 2.4 mm.  Biliary stent partially imaged. Liver: Heterogeneous appearance of the liver parenchyma with mild intrahepatic ductal dilatation noted in the porta hepatis. IMPRESSION: Minimal debris, biliary sludge or calculi along the dependent wall of the gallbladder without secondary signs acute cholecystitis. A biliary stent is partially imaged with mild intrahepatic ductal dilatation noted. A discrete liver lesion is not identified on images provided but better characterized on the most recent CT. Electronically Signed   By: Ashley Royalty M.D.   On: 12/08/2016 21:55        Scheduled Meds: . atenolol  50 mg Oral Daily  . enoxaparin (LOVENOX) injection  40 mg Subcutaneous Q24H  . famotidine  20 mg Oral Daily  . metoCLOPramide  10 mg Oral QAC breakfast  . pantoprazole  40 mg Oral Daily  . potassium chloride  SA  20 mEq Oral BID  . potassium chloride  40 mEq Oral Once  . vitamin B-12  2,500 mcg Oral Daily   Continuous Infusions: . sodium chloride 1,000 mL (12/08/16 2030)  . piperacillin-tazobactam (ZOSYN)  IV Stopped (12/09/16 0546)     LOS: 1 day    Time spent: 25 minutes  Greater than 50% of the time spent on counseling and coordinating the care.   Leisa Lenz, MD [facility] Pager [phone]  If 7PM-7AM, please contact night-coverage [email or web address] Password [email or web address] 12/09/2016, 7:41 AM

## 2016-12-09 NOTE — Progress Notes (Signed)
IP PROGRESS NOTE  Subjective:   Bryan Fields is well-known to me with a history of metastatic colon cancer. He was seen for an office visit 12/07/2016 and appeared well. He is scheduled to begin cycle 2 of salvage therapy with Lonsurf on 12/14/2016.  He reports the acute onset of fever and chills on the evening of 12/07/2016. The fever persisted on 12/08/2016 and she presented to the emergency room. He reports loose stool yesterday, but none since hospital admission. He continues to have nausea.  Objective: Vital signs in last 24 hours: Blood pressure (!) 102/55, pulse 71, temperature 98.1 F (36.7 C), temperature source Oral, resp. rate 18, height _0  (1.803 m), weight 243 lb 8 oz (110.5 kg), SpO2 98 %.  Intake/Output from previous day: 07/03 0701 - 07/04 0700 In: 510 [P.O.:460; IV Piggyback:50] Out: -   Physical Exam:  HEENT: No thrush Lungs: Clear bilaterally Cardiac: Regular rate and rhythm Abdomen: Mild diffuse tenderness, fullness in the right upper abdomen, small opening at the upper transverse wound without evidence of infection Extremities: No leg edema   Portacath/PICC-without erythema  Lab Results:  Recent Labs  12/08/16 1726 12/09/16 0500  WBC 1.9* 2.1*  HGB 11.1* 9.9*  HCT 32.1* 28.8*  PLT 130* 116*    BMET  Recent Labs  12/08/16 1726 12/09/16 0500  NA 134* 137  K 3.3* 3.1*  CL 103 104  CO2 25 24  GLUCOSE 120* 101*  BUN 17 16  CREATININE 1.15 1.07  CALCIUM 8.4* 7.8*    Studies/Results: Dg Abd Acute W/chest  Result Date: 12/08/2016 CLINICAL DATA:  Fever, nausea, vomiting and diarrhea since yesterday. History of metastatic colon carcinoma. EXAM: DG ABDOMEN ACUTE W/ 1V CHEST COMPARISON:  Plain films the abdomen 10/29/2016. CT chest, abdomen and pelvis 05/25/2016. FINDINGS: Single-view of the chest demonstrates a right IJ approach Port-A-Cath in place. Minimal atelectasis is seen in the left lung base. The lungs are otherwise clear. Heart size is  normal. No pneumothorax or pleural effusion. Two views of the abdomen show no free intraperitoneal air. The bowel gas pattern is nonobstructive. Multiple surgical clips project in the upper abdomen. Common bile duct stent is in place. IMPRESSION: No acute finding chest or abdomen. Electronically Signed   By: Inge Rise M.D.   On: 12/08/2016 18:39   US Abdomen Limited Ruq  Result Date: 12/08/2016 CLINICAL DATA:  Biliary obstruction due to cancer. Biliary stent placed on 10/15/2016. LFT and jaundice has normalized with recurrence of fever, nausea and vomiting. EXAM: ULTRASOUND ABDOMEN LIMITED RIGHT UPPER QUADRANT COMPARISON:  10/02/2016 CT FINDINGS: Gallbladder: Minimal debris, sludge or calculi along the dependent aspect of the gallbladder without wall thickening visualized. No sonographic Murphy sign noted by sonographer. Common bile duct: Diameter: 2.4 mm.  Biliary stent partially imaged. Liver: Heterogeneous appearance of the liver parenchyma with mild intrahepatic ductal dilatation noted in the porta hepatis. IMPRESSION: Minimal debris, biliary sludge or calculi along the dependent wall of the gallbladder without secondary signs acute cholecystitis. A biliary stent is partially imaged with mild intrahepatic ductal dilatation noted. A discrete liver lesion is not identified on images provided but better characterized on the most recent CT. Electronically Signed   By: Ashley Royalty M.D.   On: 12/08/2016 21:55    Medications: I have reviewed the patient's current medications.  Assessment/Plan: 1. 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 Microsatellite stable;  preserved expression of major and minor MMR proteins. He began treatment with FOLFOX in December 2011. He completed cycle #12 on 10/27/2010. Oxaliplatin was held with cycles number 7 through 10 and resumed with cycle #11. 1. History of  neutropenia secondary to chemotherapy. 2. History of thrombocytopenia secondary chemotherapy. 3. He did not undergo a preoperative colonoscopy. He underwent a colonoscopy on 12/19/2010 with findings of a 1 cm polyp at 90 cm from the anal verge, minimal polyp at 30 cm from the anal verge and minimal diverticulosis. Colonoscopy 03/18/2012-negative. 4. 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 He began study drug on 05/13/2010. 5. Status post Port-A-Cath placement. The Port-A-Cath has been removed. 6. 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 8. Low attenuation lesion in the caudate lobe of the liver on the CT 04/11/2012-? Cyst. MRI liver on 04/20/2012 favored the caudate lobe liver lesion to represent a cyst or complex cyst. CT abdomen/pelvis 04/17/2013 with continued enlargement of hypovascular lesion in the caudate lobe of the liver.  PET scan 10/27/2011 confirmed a hypermetabolic caudate lobe liver lesion, tiny indeterminate lung nodules, and a 9 mm level II left neck node   CT biopsy of the liver lesion 11/07/2013 confirmed adenocarcinoma  Left liver and caudate resection 01/15/2014-pathology confirmed metastatic colon cancer, and negative surgical margins  Surveillance CT scans 07/24/2014 consistent with recurrent disease involving upper abdominal lymph nodes, peritoneal implants, and an anterior abdominal wall mass  Status post biopsy of the anterior abdominal wall mass 08/06/2014 with pathology showing metastatic adenocarcinoma consistent with a colon primary.  [id number] 1/28  Cycle 1 FOLFIRI/Avastin on the [facility] 1317 08/20/2014  Cycle 2 FOLFIRI/Avastin 09/03/2014  Cycle 3 held on 09/17/2014 due to neutropenia  Cycle 3 FOLFIRI/Avastin 09/25/2014. Neulasta added beginning with cycle 3.  Cycle 4 FOLFIRI/Avastin  10/08/2014  Restaging CT scans 10/18/2014 with slight interval increase in the size of the soft tissue mass in the subcutaneous fat of the epigastric region. Underlying peritoneal implants, probable focus of residual disease along the resected margin of the liver and hepatoduodenal ligament lymphadenopathy all appeared slightly improved. No new sites of metastatic disease otherwise noted. New left lower lobe bronchopneumonia.  Cycle 5 FOLFIRI/Avastin 10/22/2014  Cycle 6 FOLFIRI/Avastin 11/06/2014  Cycle 7 FOLFIRI Avastin 11/19/2014  Cycle 8 FOLFIRI/Avastin 12/03/2014  CT 12/14/2014 with stable disease  Cycle 9 FOLFIRI/Avastin 12/17/2014  Cycle 10 FOLFIRI/Avastin 01/07/2015 (5-fluorouracil and leucovorin dose reduced)  Cycle 11 FOLFIRI/Avastin 01/21/2015  Cycle 12 FOLFIRI/Avastin 02/04/2015  CT 02/13/2015 with stable disease  Cycle 13 FOLFIRI/Avastin 02/25/2015-changed to an every three-week protocol, now off study  Avastin held with cycle 14 FOLFIRI on 03/25/2015 secondary to gingivitis  Restaging CTs 05/20/2015-slight increase in the size of the epigastric subcutaneous mass, stable peritoneal implants  FOLFIRI continued on a 3 week schedule  Avastin resumed 06/17/2015  CTs 12/23/2015-stable subxiphoid mass, stable low-attenuation mass adjacent to the left liver surgical margin, peritoneal lesions-stable  FOLFIRI continued on a 4 week schedule.  Avastin placed on hold 01/06/2016  FOLFIRI discontinued 03/02/2016 secondary to enlargement of the abdominal wall mass with new superficial fungating nodules involving the skin  Palliative radiation to the abdominal wall mass completed 03/20/2016  Cycle 1 FOLFOX12/27/2017  Cycle 2 CAPOX 06/22/2016  Clinical progression 07/07/2016 and allergic reaction to oxaliplatin 06/23/2015, CAPOX discontinued  Resection of the main abdominal wall mass and an adjacent peritoneal mass on 07/23/2016 with the pathology confirming  metastatic  colon cancer  [facility] CTs at [facility] 09/18/2016-new liver metastasis, progressive soft tissue masses at the liver resection margin, new mass adjacent to the chest wall resection site, increase in size of peritoneal nodules  CT abdomen/pelvis 10/02/2016-interval progression of periportal adenopathy and recurrence along the hepatic resection margin. New hepatic program lesion superior right hepatic lobe. Interval increase in size of peritoneal implants left upper quadrant. Enlargement of anabdominal wall mass along the right rectus muscle.  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 This stricture was stented with a 6cm long, uncovered 45m diameter metal bile duct stent in good position.   Initiation of Lonsurf cycle 1 11/17/2016 10. Iron deposition within the liver noted on MRI 04/20/2012. The ferritin level was normal on 04/17/2013. Increased iron deposition noted on the left liver resection 01/15/2014 11. Pain/bleeding at the anus reported when here 04/24/2013. Status post evaluation by Dr. NLucia Gaskinswith findings of an anal fissure. 12. Right face cystic lesion 13. Status post Port-A-Cath placement 08/14/2014 14. Delayed nausea following FOLFIRI, prophylactic Decadron added with cycle 2 with improvement. 15. Chest CT 10/18/2014 with new left lower lobe bronchopneumonia. Levaquin initiated. Resolved on CT 12/14/2014 16. Gum pain-mucositis?, Gingivitis?-Improved with discontinuation of Avastin 17. Pain/tenderness, mild erythema and full appearance over the maxillary sinuses. Maxillofacial CT 01/30/2015 with mild to moderate bilateral maxillary and sphenoid sinus inflammatory changes. Multifocal right maxillary dental periapical lucency. Status post a right upper tooth extraction with resolution of the pain. 18. Admission  10/21/2016 with nausea/vomiting-x-ray evidence for a partial small bowel obstruction, clinical improvement with bowel rest, recurrent obstructive symptoms during the week of 11/30/2016                                                                                        19. Admission 12/08/2016 with a high fever-no apparent source for infection                                                                                        20. Mild neutropenia and anemia/thrombocytopenia secondary to chemotherapy   Mr. SBloodworthwas admitted yesterday with a high fever in the setting of mild neutropenia. There is no apparent source for infection. He is at increased risk for infection in the setting of chemotherapy, and indwelling Port-A-Cath, bile duct stent, abdominal wound, and carcinomatosis. There was pyuria on the urinalysis 12/08/2016.  Recommendations: 1. Continue broad-spectrum antibiotics, follow-up cultures 2. Consider discharge with course of oral antibiotics if his fever remains down and cultures return negative 3. [facility]. Please call Oncology as needed on 12/10/2016. I will check on him 12/11/2016 if he remains in the hospital.  I appreciate the care from Dr. DCharlies Silvers  LOS: 1 day   Donneta Romberg, MD   12/09/2016, 8:05 AM

## 2016-12-10 LAB — BASIC METABOLIC PANEL
Anion gap: 6 (ref 5–15)
BUN: 17 mg/dL (ref 6–20)
CALCIUM: 8.2 mg/dL — AB (ref 8.9–10.3)
CO2: 24 mmol/L (ref 22–32)
CREATININE: 1.07 mg/dL (ref 0.61–1.24)
Chloride: 103 mmol/L (ref 101–111)
GFR calc non Af Amer: >60 mL/min (ref >60)
GLUCOSE: 125 mg/dL — AB (ref 65–99)
Potassium: 4 mmol/L (ref 3.5–5.1)
Sodium: 133 mmol/L — ABNORMAL LOW (ref 135–145)

## 2016-12-10 LAB — GLUCOSE, CAPILLARY: Glucose-Capillary: 85 mg/dL (ref 65–99)

## 2016-12-10 LAB — URINE CULTURE

## 2016-12-10 LAB — CBC
HEMATOCRIT: 28.9 % — AB (ref 39.0–52.0)
Hemoglobin: 10 g/dL — ABNORMAL LOW (ref 13.0–17.0)
MCH: 31.9 pg (ref 26.0–34.0)
MCHC: 34.6 g/dL (ref 30.0–36.0)
MCV: 92.3 fL (ref 78.0–100.0)
Platelets: 117 10*3/uL — ABNORMAL LOW (ref 150–400)
RBC: 3.13 MIL/uL — ABNORMAL LOW (ref 4.22–5.81)
RDW: 14.6 % (ref 11.5–15.5)
WBC: 1.9 10*3/uL — ABNORMAL LOW (ref 4.0–10.5)

## 2016-12-10 LAB — C DIFFICILE QUICK SCREEN W PCR REFLEX
C DIFFICILE (CDIFF) INTERP: NOT DETECTED
C Diff antigen: NEGATIVE
C Diff toxin: NEGATIVE

## 2016-12-10 MED ORDER — BOOST PLUS PO LIQD
237.0000 mL | Freq: Three times a day (TID) | ORAL | Status: DC
  Administered 2016-12-10 – 2016-12-18 (×12): 237 mL via ORAL
  Filled 2016-12-10 (×26): qty 237

## 2016-12-10 MED ORDER — SODIUM CHLORIDE 0.9 % IV BOLUS (SEPSIS)
500.0000 mL | Freq: Once | INTRAVENOUS | Status: AC
  Administered 2016-12-10: 500 mL via INTRAVENOUS

## 2016-12-10 MED ORDER — IBUPROFEN 200 MG PO TABS
400.0000 mg | ORAL_TABLET | Freq: Once | ORAL | Status: AC
  Administered 2016-12-10: 400 mg via ORAL
  Filled 2016-12-10: qty 2

## 2016-12-10 NOTE — Progress Notes (Signed)
Nutrition Brief Note  Patient identified on the Malnutrition Screening Tool (MST) Report  Patient's weight is stable. Pt has been consuming 95-100% of meals since 7/3. Pt drinks Boost Plus supplements at home so have ordered those as well.  Wt Readings from Last 15 Encounters:  12/09/16 243 lb 8 oz (110.5 kg)  12/07/16 232 lb 4.8 oz (105.4 kg)  11/27/16 233 lb 4.8 oz (105.8 kg)  11/03/16 243 lb 9.6 oz (110.5 kg)  10/29/16 248 lb 9.6 oz (112.8 kg)  10/21/16 249 lb (112.9 kg)  10/21/16 249 lb 6.4 oz (113.1 kg)  10/19/16 252 lb 4.8 oz (114.4 kg)  10/15/16 253 lb (114.8 kg)  09/30/16 253 lb 1 oz (114.8 kg)  08/25/16 250 lb 4 oz (113.5 kg)  07/23/16 249 lb (112.9 kg)  07/20/16 249 lb 9.6 oz (113.2 kg)  06/22/16 251 lb 12.8 oz (114.2 kg)  06/03/16 249 lb 8 oz (113.2 kg)    Body mass index is 33.96 kg/m. Patient meets criteria for obesity based on current BMI.   Current diet order is soft, patient is consuming approximately 95-100% of meals at this time. Labs and medications reviewed.    If nutrition issues arise, please consult RD.   Clayton Bibles, MS, RD, LDN Pager: [phone] After Hours Pager: [phone]

## 2016-12-10 NOTE — Progress Notes (Signed)
Patient ID: Bryan Fields, male   DOB: May 14, 1961, 56 y.o.   MRN: [id number]  PROGRESS NOTE    Bryan Fields  ACZ:[id number] DOB: Sep 15, 1960 DOA: 12/08/2016  PCP: Ria Bush, MD   Brief Narrative:  56 year old male with history of metastatic colon cancer, scheduled to begin cycle 2 salvage chemotherapy with Lonsurf 12/14/16). Pt presented to ED with sudden onset fever, chills and diarrhea started 1 day prior to the admission. On admission, pt ANC was 1,400. CXR and UA were not suggestive of an acute infectious process. He was started on empiric vanco and zosyn while awaiting culture results and stool studies. Stool studies have not been collected as his diarrhea has stopped in hospital.   Assessment & Plan:   Principal Problem:   Febrile neutropenia (HCC) / Enterobacter bacteremia - Blood cx on admission grew enterobacter species - Repeat blood cx this am to ensure clearance of bacteremia - On rocephin and flagyl  - WBC count little down yesterday - Follow up CBC in am  Active Problems:   Sepsis due to enterobacter species - Sepsis criteria met on admission with fever, tachypnea, neutropenia - Blood cx on admission grew enterobacter species - On rocephin and flagyl     Essential hypertension - Hold atenolol, BP on soft side this am      Elevated LFTs / Metastatic colon cancer to liver (HCC) - Biliary stent in place     Pancytopenia ([place]) - Due to malignancy  - Hgb stable - Platelets stable  - Monitor daily CBC    Nausea vomiting and diarrhea - Improved with Reglan - Diarrhea stopped     Hypokalemia - Due to GI losses - Supplemented and WNL   DVT prophylaxis: Lovenox subQ Code Status: full code  Family Communication: wife at bedside this am Disposition Plan: home in 48 hours    Consultants:   Oncology  Procedures:   None  Antimicrobials:   Vanco and zosyn 7/2 --> 7/4  Rocephin and flagyl 7/4 -->   Subjective: Feels little  better.  Objective: Vitals:   12/10/16 0436 12/10/16 0541 12/10/16 0618 12/10/16 0936  BP: (!) 88/58 (!) 96/58 (!) 92/56 104/66  Pulse: (!) 53 (!) 58  60  Resp: 18     Temp: (!) 97.5 F (36.4 C) 97.8 F (36.6 C)    TempSrc: Oral     SpO2: 95%     Weight:      Height:        Intake/Output Summary (Last 24 hours) at 12/10/16 1003 Last data filed at 12/09/16 1801  Gross per 24 hour  Intake              460 ml  Output                0 ml  Net              460 ml   Filed Weights   12/08/16 1653 12/08/16 2158 12/09/16 0605  Weight: 105.2 kg (232 lb) 110.5 kg (243 lb 9.7 oz) 110.5 kg (243 lb 8 oz)    Examination:  Physical Exam  Constitutional: Appears well-developed and well-nourished. No distress.   Eyes: icteric sclera  Neck: Normal ROM. Neck supple. No JVD. No tracheal deviation. No thyromegaly.  CVS: RRR, S1/S2 + Pulmonary: Effort and breath sounds normal, no stridor, rhonchi, wheezes, rales.  Abdominal: Soft. BS +,  no distension, tenderness, rebound or guarding.  Musculoskeletal: Normal range of motion. No  edema and no tenderness.  Lymphadenopathy: No lymphadenopathy noted, cervical, inguinal. Neuro: Alert. Normal reflexes, muscle tone coordination. No cranial nerve deficit. Skin: Skin is warm and dry.  Psychiatric: Normal mood and affect. Behavior, judgment, thought content normal.     Data Reviewed: I have personally reviewed following labs and imaging studies  CBC:  Recent Labs Lab 12/08/16 1726 12/09/16 0500 12/10/16 0357  WBC 1.9* 2.1* 1.9*  NEUTROABS 1.4* 1.2*  --   HGB 11.1* 9.9* 10.0*  HCT 32.1* 28.8* 28.9*  MCV 92.0 91.1 92.3  PLT 130* 116* 323*   Basic Metabolic Panel:  Recent Labs Lab 12/08/16 1726 12/09/16 0500 12/10/16 0357  NA 134* 137 133*  K 3.3* 3.1* 4.0  CL 103 104 103  CO2 '25 24 24  '$ GLUCOSE 120* 101* 125*  BUN '17 16 17  '$ CREATININE 1.15 1.07 1.07  CALCIUM 8.4* 7.8* 8.2*   GFR: Estimated Creatinine Clearance: 95.2 mL/min  (by C-G formula based on SCr of 1.07 mg/dL). Liver Function Tests:  Recent Labs Lab 12/08/16 1726 12/09/16 0500  AST 120* 64*  ALT 164* 114*  ALKPHOS 165* 131*  BILITOT 5.8* 3.9*  PROT 6.8 5.5*  ALBUMIN 3.2* 2.6*   No results for input(s): LIPASE, AMYLASE in the last 168 hours. No results for input(s): AMMONIA in the last 168 hours. Coagulation Profile: No results for input(s): INR, PROTIME in the last 168 hours. Cardiac Enzymes: No results for input(s): CKTOTAL, CKMB, CKMBINDEX, TROPONINI in the last 168 hours. BNP (last 3 results) No results for input(s): PROBNP in the last 8760 hours. HbA1C: No results for input(s): HGBA1C in the last 72 hours. CBG:  Recent Labs Lab 12/09/16 0853 12/10/16 0818  GLUCAP 119* 85   Lipid Profile: No results for input(s): CHOL, HDL, LDLCALC, TRIG, CHOLHDL, LDLDIRECT in the last 72 hours. Thyroid Function Tests: No results for input(s): TSH, T4TOTAL, FREET4, T3FREE, THYROIDAB in the last 72 hours. Anemia Panel: No results for input(s): VITAMINB12, FOLATE, FERRITIN, TIBC, IRON, RETICCTPCT in the last 72 hours. Urine analysis:    Component Value Date/Time   COLORURINE AMBER (A) 12/08/2016 1708   APPEARANCEUR HAZY (A) 12/08/2016 1708   LABSPEC 1.035 (H) 12/08/2016 1708   LABSPEC 1.020 03/18/2015 0847   PHURINE 5.0 12/08/2016 1708   GLUCOSEU NEGATIVE 12/08/2016 1708   GLUCOSEU Negative 03/18/2015 0847   HGBUR NEGATIVE 12/08/2016 1708   HGBUR small 04/01/2010 0848   BILIRUBINUR MODERATE (A) 12/08/2016 1708   BILIRUBINUR Negative 03/18/2015 0847   KETONESUR NEGATIVE 12/08/2016 1708   PROTEINUR 100 (A) 12/08/2016 1708   UROBILINOGEN 0.2 03/18/2015 0847   NITRITE NEGATIVE 12/08/2016 1708   LEUKOCYTESUR NEGATIVE 12/08/2016 1708   LEUKOCYTESUR Negative 03/18/2015 0847   Sepsis Labs: '@LABRCNTIP'$ (procalcitonin:4,lacticidven:4)   ) Recent Results (from the past 240 hour(s))  Blood Culture (routine x 2)     Status: None (Preliminary  result)   Collection Time: 12/08/16  5:08 PM  Result Value Ref Range Status   Specimen Description BLOOD RIGHT ANTECUBITAL  Final   Special Requests   Final    BOTTLES DRAWN AEROBIC AND ANAEROBIC Blood Culture adequate volume   Culture   Final    NO GROWTH < 24 HOURS Performed at [facility], [place] [address], [place], [place] [address]    Report Status PENDING  Incomplete  Blood Culture (routine x 2)     Status: Abnormal (Preliminary result)   Collection Time: 12/08/16  7:02 PM  Result Value Ref Range Status  Specimen Description LEFT ANTECUBITAL  Final   Special Requests IN PEDIATRIC BOTTLE Blood Culture adequate volume  Final   Culture  Setup Time   Final    GRAM NEGATIVE RODS IN PEDIATRIC BOTTLE CRITICAL RESULT CALLED TO, READ BACK BY AND VERIFIED WITH: Christean Grief 10:30 12/09/16 ([email or web address])    Culture (A)  Final    KLEBSIELLA PNEUMONIAE SUSCEPTIBILITIES TO FOLLOW Performed at [facility], [place] [address] [place], [place] [address]    Report Status PENDING  Incomplete  Blood Culture ID Panel (Reflexed)     Status: Abnormal   Collection Time: 12/08/16  7:02 PM  Result Value Ref Range Status   Enterococcus species NOT DETECTED NOT DETECTED Final   Listeria monocytogenes NOT DETECTED NOT DETECTED Final   Staphylococcus species NOT DETECTED NOT DETECTED Final   Staphylococcus aureus NOT DETECTED NOT DETECTED Final   Streptococcus species NOT DETECTED NOT DETECTED Final   Streptococcus agalactiae NOT DETECTED NOT DETECTED Final   Streptococcus pneumoniae NOT DETECTED NOT DETECTED Final   Streptococcus pyogenes NOT DETECTED NOT DETECTED Final   Acinetobacter baumannii NOT DETECTED NOT DETECTED Final   Enterobacteriaceae species DETECTED (A) NOT DETECTED Final    Comment: Enterobacteriaceae represent a large family of gram-negative bacteria, not a single organism. CRITICAL RESULT CALLED TO, READ BACK BY AND VERIFIED WITH: 10:30 12/09/16 ([email or web address])    Enterobacter cloacae  complex NOT DETECTED NOT DETECTED Final   Escherichia coli NOT DETECTED NOT DETECTED Final   Klebsiella oxytoca NOT DETECTED NOT DETECTED Final   Klebsiella pneumoniae DETECTED (A) NOT DETECTED Final    Comment: CRITICAL RESULT CALLED TO, READ BACK BY AND VERIFIED WITH: Mila Merry.D. 10:30 12/09/16 ([email or web address])    Proteus species NOT DETECTED NOT DETECTED Final   Serratia marcescens NOT DETECTED NOT DETECTED Final   Carbapenem resistance NOT DETECTED NOT DETECTED Final   Haemophilus influenzae NOT DETECTED NOT DETECTED Final   Neisseria meningitidis NOT DETECTED NOT DETECTED Final   Pseudomonas aeruginosa NOT DETECTED NOT DETECTED Final   Candida albicans NOT DETECTED NOT DETECTED Final   Candida glabrata NOT DETECTED NOT DETECTED Final   Candida krusei NOT DETECTED NOT DETECTED Final   Candida parapsilosis NOT DETECTED NOT DETECTED Final   Candida tropicalis NOT DETECTED NOT DETECTED Final    Comment: Performed at [facility], [address] [place], [place] [address]      Radiology Studies: Dg Abd Acute W/chest  Result Date: 12/08/2016 CLINICAL DATA:  Fever, nausea, vomiting and diarrhea since yesterday. History of metastatic colon carcinoma. EXAM: DG ABDOMEN ACUTE W/ 1V CHEST COMPARISON:  Plain films the abdomen 10/29/2016. CT chest, abdomen and pelvis 05/25/2016. FINDINGS: Single-view of the chest demonstrates a right IJ approach Port-A-Cath in place. Minimal atelectasis is seen in the left lung base. The lungs are otherwise clear. Heart size is normal. No pneumothorax or pleural effusion. Two views of the abdomen show no free intraperitoneal air. The bowel gas pattern is nonobstructive. Multiple surgical clips project in the upper abdomen. Common bile duct stent is in place. IMPRESSION: No acute finding chest or abdomen. Electronically Signed   By: Inge Rise M.D.   On: 12/08/2016 18:39   US Abdomen Limited Ruq  Result Date: 12/08/2016 CLINICAL DATA:  Biliary obstruction  due to cancer. Biliary stent placed on 10/15/2016. LFT and jaundice has normalized with recurrence of fever, nausea and vomiting. EXAM: ULTRASOUND ABDOMEN LIMITED RIGHT UPPER QUADRANT COMPARISON:  10/02/2016 CT FINDINGS: Gallbladder: Minimal debris, sludge or  calculi along the dependent aspect of the gallbladder without wall thickening visualized. No sonographic Murphy sign noted by sonographer. Common bile duct: Diameter: 2.4 mm.  Biliary stent partially imaged. Liver: Heterogeneous appearance of the liver parenchyma with mild intrahepatic ductal dilatation noted in the porta hepatis. IMPRESSION: Minimal debris, biliary sludge or calculi along the dependent wall of the gallbladder without secondary signs acute cholecystitis. A biliary stent is partially imaged with mild intrahepatic ductal dilatation noted. A discrete liver lesion is not identified on images provided but better characterized on the most recent CT. Electronically Signed   By: Ashley Royalty M.D.   On: 12/08/2016 21:55        Scheduled Meds: . atenolol  50 mg Oral Daily  . enoxaparin (LOVENOX) injection  40 mg Subcutaneous Q24H  . famotidine  20 mg Oral Daily  . lactose free nutrition  237 mL Oral TID WC  . metoCLOPramide  10 mg Oral QAC breakfast  . metroNIDAZOLE  500 mg Oral Q8H  . pantoprazole  40 mg Oral Daily  . potassium chloride SA  20 mEq Oral BID  . vitamin B-12  2,500 mcg Oral Daily   Continuous Infusions: . cefTRIAXone (ROCEPHIN)  IV Stopped (12/09/16 1732)     LOS: 2 days    Time spent: 25 minutes  Greater than 50% of the time spent on counseling and coordinating the care.   Leisa Lenz, MD [facility] Pager [phone]  If 7PM-7AM, please contact night-coverage [email or web address] Password [email or web address] 12/10/2016, 10:03 AM

## 2016-12-10 NOTE — Progress Notes (Signed)
Pt reports 4 watery stools in last 2 hrs. MD notified. Stool specimen sent

## 2016-12-10 NOTE — Progress Notes (Signed)
Pts blood pressure is 88/58, HR 53. Paged MD. Gave 529ml bolus.

## 2016-12-10 NOTE — Progress Notes (Signed)
Patient's blood pressure is 92/56. MD notified.

## 2016-12-11 ENCOUNTER — Encounter: Payer: Self-pay | Admitting: *Deleted

## 2016-12-11 ENCOUNTER — Ambulatory Visit: Payer: 59 | Admitting: Oncology

## 2016-12-11 ENCOUNTER — Other Ambulatory Visit: Payer: 59

## 2016-12-11 LAB — CBC
HCT: 28 % — ABNORMAL LOW (ref 39.0–52.0)
Hemoglobin: 9.8 g/dL — ABNORMAL LOW (ref 13.0–17.0)
MCH: 31.9 pg (ref 26.0–34.0)
MCHC: 35 g/dL (ref 30.0–36.0)
MCV: 91.2 fL (ref 78.0–100.0)
PLATELETS: 140 10*3/uL — AB (ref 150–400)
RBC: 3.07 MIL/uL — ABNORMAL LOW (ref 4.22–5.81)
RDW: 14.8 % (ref 11.5–15.5)
WBC: 1.7 10*3/uL — AB (ref 4.0–10.5)

## 2016-12-11 LAB — CULTURE, BLOOD (ROUTINE X 2): Special Requests: ADEQUATE

## 2016-12-11 LAB — BASIC METABOLIC PANEL
ANION GAP: 6 (ref 5–15)
BUN: 14 mg/dL (ref 6–20)
CALCIUM: 8.3 mg/dL — AB (ref 8.9–10.3)
CO2: 24 mmol/L (ref 22–32)
Chloride: 106 mmol/L (ref 101–111)
Creatinine, Ser: 0.84 mg/dL (ref 0.61–1.24)
Glucose, Bld: 136 mg/dL — ABNORMAL HIGH (ref 65–99)
Potassium: 3.3 mmol/L — ABNORMAL LOW (ref 3.5–5.1)
Sodium: 136 mmol/L (ref 135–145)

## 2016-12-11 MED ORDER — SODIUM CHLORIDE 0.9% FLUSH: 10.0000 mL | INTRAVENOUS | Status: DC | PRN

## 2016-12-11 MED ORDER — POTASSIUM CHLORIDE CRYS ER 20 MEQ PO TBCR: 40.0000 meq | EXTENDED_RELEASE_TABLET | Freq: Once | ORAL | Status: DC

## 2016-12-11 MED ORDER — ACYCLOVIR 5 % EX OINT
TOPICAL_OINTMENT | CUTANEOUS | Status: DC
  Administered 2016-12-11: 11:00:00 via TOPICAL
  Administered 2016-12-11: 1 via TOPICAL
  Administered 2016-12-11 – 2016-12-12 (×3): via TOPICAL
  Administered 2016-12-12: 1 via TOPICAL
  Administered 2016-12-12 (×3): via TOPICAL
  Administered 2016-12-12: 1 via TOPICAL
  Administered 2016-12-12 – 2016-12-14 (×10): via TOPICAL
  Administered 2016-12-14 – 2016-12-15 (×2): 1 via TOPICAL
  Administered 2016-12-15 (×4): via TOPICAL
  Administered 2016-12-15: 1 via TOPICAL
  Administered 2016-12-16 – 2016-12-17 (×6): via TOPICAL
  Filled 2016-12-11: qty 15

## 2016-12-11 MED ORDER — POTASSIUM CHLORIDE CRYS ER 20 MEQ PO TBCR
40.0000 meq | EXTENDED_RELEASE_TABLET | Freq: Every day | ORAL | Status: DC
  Administered 2016-12-11 – 2016-12-18 (×7): 40 meq via ORAL
  Filled 2016-12-11 (×7): qty 2

## 2016-12-11 NOTE — Progress Notes (Signed)
[facility] Work  Clinical Social Work received phone call from patient.  Bryan Fields is currently in the hospital and is hopeful to get discharged this weekend.  He shared he recently received letter he would qualify for Medicare on September 1st.  He is unsure if he should sign up for Medicare or keep his [employer] through his retirement plan at [employer].  CSW encouraged patient to contact [employer] regarding this insurance serving as his secondary and discuss costs (patient's spouse is also on plan).  Patient agreed to contact the company and would appreciate CSW follow up call once he has additional information.  CSW team will follow up with patient.   Maryjean Morn, MSW, LCSW, OSW-C Clinical Social Worker [facility] [phone]

## 2016-12-11 NOTE — Progress Notes (Addendum)
Patient ID: Bryan Fields, male   DOB: 01/30/1961, 56 y.o.   MRN: [id number]  PROGRESS NOTE    ALTAIR APPENZELLER  CHY:[id number] DOB: 04-17-1961 DOA: 12/08/2016  PCP: Ria Bush, MD   Brief Narrative:  56 year old male with history of metastatic colon cancer, scheduled to begin cycle 2 salvage chemotherapy with Lonsurf 12/14/16). Pt presented to ED with sudden onset fever, chills and diarrhea started 1 day prior to the admission. On admission, pt ANC was 1,400. CXR and UA were not suggestive of an acute infectious process. He was started on empiric vanco and zosyn while awaiting culture results and stool studies. Stool studies have not been collected as his diarrhea has stopped in hospital.   Assessment & Plan:   Principal Problem:   Febrile neutropenia (HCC) - WBC count 1.7 this am - Continue to monitor daily CBC - No fevers so far - On rocephin and flagyl  Active Problems:   Sepsis due to Klebsiella pneumoniae  - Sepsis criteria met on admission with fever, tachypnea, neutropenia - Blood cultures grew klebsiella pneumoniae species - On rocephin and flagyl     Essential hypertension - Will resume atenolol       Elevated LFTs / Metastatic colon cancer to liver (HCC) - Biliary stent in place     Pancytopenia ([name]) - Due to malignancy - Monitor daily CBC    Nausea vomiting and diarrhea - Had few loose stool in past 24 hours - Stool neg for C.diff    Hypokalemia - Due to GI losses - Continue to supplement    DVT prophylaxis: Lovenox subQ Code Status: full code  Family Communication: wife at bedside Disposition Plan: home in next 1-2 days   Consultants:   Oncology  Procedures:   None   Antimicrobials:   Vanco and zosyn 7/2 - 7/4  Rocephin and flagyl 7/4 -->   Subjective: No overnight events.  Objective: Vitals:   12/10/16 1400 12/10/16 2136 12/11/16 0503 12/11/16 1425  BP: 124/79 102/63 120/73 (!) 150/76  Pulse: 68 63 84 73  Resp: _0 Temp: 98.6 F (37 C) 98.1 F (36.7 C) 98.6 F (37 C) 98.2 F (36.8 C)  TempSrc: Oral Oral Oral Oral  SpO2: 100% 99% 99% 100%  Weight:      Height:        Intake/Output Summary (Last 24 hours) at 12/11/16 1444 Last data filed at 12/11/16 1425  Gross per 24 hour  Intake             1010 ml  Output                0 ml  Net             1010 ml   Filed Weights   12/08/16 1653 12/08/16 2158 12/09/16 0605  Weight: 105.2 kg (232 lb) 110.5 kg (243 lb 9.7 oz) 110.5 kg (243 lb 8 oz)    Examination:  General exam: Appears calm and comfortable  Respiratory system: Clear to auscultation. Respiratory effort normal. Cardiovascular system: S1 & S2 heard, RRR. Gastrointestinal system: Abdomen is nondistended, soft and nontender. No organomegaly or masses felt. Normal bowel sounds heard. Central nervous system: Alert and oriented. No focal neurological deficits. Extremities: Symmetric 5 x 5 power. Skin: few cold sores on mouth, lower lip Psychiatry: Judgement and insight appear normal. Mood & affect appropriate.   Data Reviewed: I have personally reviewed following labs and imaging studies  CBC:  Recent Labs Lab 12/08/16 1726 12/09/16 0500 12/10/16 0357 12/11/16 0447  WBC 1.9* 2.1* 1.9* 1.7*  NEUTROABS 1.4* 1.2*  --   --   HGB 11.1* 9.9* 10.0* 9.8*  HCT 32.1* 28.8* 28.9* 28.0*  MCV 92.0 91.1 92.3 91.2  PLT 130* 116* 117* 014*   Basic Metabolic Panel:  Recent Labs Lab 12/08/16 1726 12/09/16 0500 12/10/16 0357 12/11/16 0447  NA 134* 137 133* 136  K 3.3* 3.1* 4.0 3.3*  CL 103 104 103 106  CO2 _0 GLUCOSE 120* 101* 125* 136*  BUN _1 CREATININE 1.15 1.07 1.07 0.84  CALCIUM 8.4* 7.8* 8.2* 8.3*   GFR: Estimated Creatinine Clearance: 121.2 mL/min (by C-G formula based on SCr of 0.84 mg/dL). Liver Function Tests:  Recent Labs Lab 12/08/16 1726 12/09/16 0500  AST 120* 64*  ALT 164* 114*  ALKPHOS 165* 131*  BILITOT 5.8* 3.9*  PROT 6.8 5.5*    ALBUMIN 3.2* 2.6*   No results for input(s): LIPASE, AMYLASE in the last 168 hours. No results for input(s): AMMONIA in the last 168 hours. Coagulation Profile: No results for input(s): INR, PROTIME in the last 168 hours. Cardiac Enzymes: No results for input(s): CKTOTAL, CKMB, CKMBINDEX, TROPONINI in the last 168 hours. BNP (last 3 results) No results for input(s): PROBNP in the last 8760 hours. HbA1C: No results for input(s): HGBA1C in the last 72 hours. CBG:  Recent Labs Lab 12/09/16 0853 12/10/16 0818  GLUCAP 119* 85   Lipid Profile: No results for input(s): CHOL, HDL, LDLCALC, TRIG, CHOLHDL, LDLDIRECT in the last 72 hours. Thyroid Function Tests: No results for input(s): TSH, T4TOTAL, FREET4, T3FREE, THYROIDAB in the last 72 hours. Anemia Panel: No results for input(s): VITAMINB12, FOLATE, FERRITIN, TIBC, IRON, RETICCTPCT in the last 72 hours. Urine analysis:    Component Value Date/Time   COLORURINE AMBER (A) 12/08/2016 1708   APPEARANCEUR HAZY (A) 12/08/2016 1708   LABSPEC 1.035 (H) 12/08/2016 1708   LABSPEC 1.020 03/18/2015 0847   PHURINE 5.0 12/08/2016 1708   GLUCOSEU NEGATIVE 12/08/2016 1708   GLUCOSEU Negative 03/18/2015 0847   HGBUR NEGATIVE 12/08/2016 1708   HGBUR small 04/01/2010 0848   BILIRUBINUR MODERATE (A) 12/08/2016 1708   BILIRUBINUR Negative 03/18/2015 0847   KETONESUR NEGATIVE 12/08/2016 1708   PROTEINUR 100 (A) 12/08/2016 1708   UROBILINOGEN 0.2 03/18/2015 0847   NITRITE NEGATIVE 12/08/2016 1708   LEUKOCYTESUR NEGATIVE 12/08/2016 1708   LEUKOCYTESUR Negative 03/18/2015 0847   Sepsis Labs: _2 (procalcitonin:4,lacticidven:4)   ) Recent Results (from the past 240 hour(s))  Blood Culture (routine x 2)     Status: None (Preliminary result)   Collection Time: 12/08/16  5:08 PM  Result Value Ref Range Status   Specimen Description BLOOD RIGHT ANTECUBITAL  Final   Special Requests   Final    BOTTLES DRAWN AEROBIC AND ANAEROBIC Blood  Culture adequate volume   Culture   Final    NO GROWTH 3 DAYS Performed at [facility], [place] [address] [place], [place] [address]    Report Status PENDING  Incomplete  Urine culture     Status: Abnormal   Collection Time: 12/08/16  5:08 PM  Result Value Ref Range Status   Specimen Description URINE, RANDOM  Final   Special Requests Immunocompromised  Final   Culture MULTIPLE SPECIES PRESENT, SUGGEST RECOLLECTION (A)  Final   Report Status 12/10/2016 FINAL  Final  Blood Culture (routine x 2)  Status: Abnormal   Collection Time: 12/08/16  7:02 PM  Result Value Ref Range Status   Specimen Description LEFT ANTECUBITAL  Final   Special Requests IN PEDIATRIC BOTTLE Blood Culture adequate volume  Final   Culture  Setup Time   Final    GRAM NEGATIVE RODS IN PEDIATRIC BOTTLE CRITICAL RESULT CALLED TO, READ BACK BY AND VERIFIED WITH: Christean Grief 10:30 12/09/16 ([email or web address]) Performed at [facility], [place] [address], [place], [place] [address]    Culture KLEBSIELLA PNEUMONIAE (A)  Final   Report Status 12/11/2016 FINAL  Final   Organism ID, Bacteria KLEBSIELLA PNEUMONIAE  Final      Susceptibility   Klebsiella pneumoniae - MIC*    AMPICILLIN RESISTANT Resistant     CEFAZOLIN <=4 SENSITIVE Sensitive     CEFEPIME <=1 SENSITIVE Sensitive     CEFTAZIDIME <=1 SENSITIVE Sensitive     CEFTRIAXONE <=1 SENSITIVE Sensitive     CIPROFLOXACIN <=0.25 SENSITIVE Sensitive     GENTAMICIN <=1 SENSITIVE Sensitive     IMIPENEM <=0.25 SENSITIVE Sensitive     TRIMETH/SULFA <=20 SENSITIVE Sensitive     AMPICILLIN/SULBACTAM 4 SENSITIVE Sensitive     PIP/TAZO <=4 SENSITIVE Sensitive     Extended ESBL NEGATIVE Sensitive     * KLEBSIELLA PNEUMONIAE  Blood Culture ID Panel (Reflexed)     Status: Abnormal   Collection Time: 12/08/16  7:02 PM  Result Value Ref Range Status   Enterococcus species NOT DETECTED NOT DETECTED Final   Listeria monocytogenes NOT DETECTED NOT DETECTED Final   Staphylococcus  species NOT DETECTED NOT DETECTED Final   Staphylococcus aureus NOT DETECTED NOT DETECTED Final   Streptococcus species NOT DETECTED NOT DETECTED Final   Streptococcus agalactiae NOT DETECTED NOT DETECTED Final   Streptococcus pneumoniae NOT DETECTED NOT DETECTED Final   Streptococcus pyogenes NOT DETECTED NOT DETECTED Final   Acinetobacter baumannii NOT DETECTED NOT DETECTED Final   Enterobacteriaceae species DETECTED (A) NOT DETECTED Final    Comment: Enterobacteriaceae represent a large family of gram-negative bacteria, not a single organism. CRITICAL RESULT CALLED TO, READ BACK BY AND VERIFIED WITH: 10:30 12/09/16 ([email or web address])    Enterobacter cloacae complex NOT DETECTED NOT DETECTED Final   Escherichia coli NOT DETECTED NOT DETECTED Final   Klebsiella oxytoca NOT DETECTED NOT DETECTED Final   Klebsiella pneumoniae DETECTED (A) NOT DETECTED Final    Comment: CRITICAL RESULT CALLED TO, READ BACK BY AND VERIFIED WITH: Mila Merry.D. 10:30 12/09/16 ([email or web address])    Proteus species NOT DETECTED NOT DETECTED Final   Serratia marcescens NOT DETECTED NOT DETECTED Final   Carbapenem resistance NOT DETECTED NOT DETECTED Final   Haemophilus influenzae NOT DETECTED NOT DETECTED Final   Neisseria meningitidis NOT DETECTED NOT DETECTED Final   Pseudomonas aeruginosa NOT DETECTED NOT DETECTED Final   Candida albicans NOT DETECTED NOT DETECTED Final   Candida glabrata NOT DETECTED NOT DETECTED Final   Candida krusei NOT DETECTED NOT DETECTED Final   Candida parapsilosis NOT DETECTED NOT DETECTED Final   Candida tropicalis NOT DETECTED NOT DETECTED Final    Comment: Performed at [facility], [address] [place], [place] [address]  Culture, blood (routine x 2)     Status: None (Preliminary result)   Collection Time: 12/10/16  5:39 AM  Result Value Ref Range Status   Specimen Description BLOOD RIGHT ARM  Final   Special Requests   Final    BOTTLES DRAWN AEROBIC AND ANAEROBIC Blood Culture  adequate volume  Culture   Final    NO GROWTH 1 DAY Performed at [facility], [place] [address], [place], [place] [address]    Report Status PENDING  Incomplete  Culture, blood (routine x 2)     Status: None (Preliminary result)   Collection Time: 12/10/16  5:46 AM  Result Value Ref Range Status   Specimen Description BLOOD LEFT ARM  Final   Special Requests   Final    BOTTLES DRAWN AEROBIC AND ANAEROBIC Blood Culture adequate volume   Culture   Final    NO GROWTH 1 DAY Performed at [facility], [place] [address], [place], [place] [address]    Report Status PENDING  Incomplete  C difficile quick scan w PCR reflex     Status: None   Collection Time: 12/10/16  6:53 PM  Result Value Ref Range Status   C Diff antigen NEGATIVE NEGATIVE Final   C Diff toxin NEGATIVE NEGATIVE Final   C Diff interpretation No C. difficile detected.  Final      Radiology Studies: Dg Abd Acute W/chest  Result Date: 12/08/2016 CLINICAL DATA:  Fever, nausea, vomiting and diarrhea since yesterday. History of metastatic colon carcinoma. EXAM: DG ABDOMEN ACUTE W/ 1V CHEST COMPARISON:  Plain films the abdomen 10/29/2016. CT chest, abdomen and pelvis 05/25/2016. FINDINGS: Single-view of the chest demonstrates a right IJ approach Port-A-Cath in place. Minimal atelectasis is seen in the left lung base. The lungs are otherwise clear. Heart size is normal. No pneumothorax or pleural effusion. Two views of the abdomen show no free intraperitoneal air. The bowel gas pattern is nonobstructive. Multiple surgical clips project in the upper abdomen. Common bile duct stent is in place. IMPRESSION: No acute finding chest or abdomen. Electronically Signed   By: Inge Rise M.D.   On: 12/08/2016 18:39   US Abdomen Limited Ruq  Result Date: 12/08/2016 CLINICAL DATA:  Biliary obstruction due to cancer. Biliary stent placed on 10/15/2016. LFT and jaundice has normalized with recurrence of fever, nausea and vomiting. EXAM:  ULTRASOUND ABDOMEN LIMITED RIGHT UPPER QUADRANT COMPARISON:  10/02/2016 CT FINDINGS: Gallbladder: Minimal debris, sludge or calculi along the dependent aspect of the gallbladder without wall thickening visualized. No sonographic Murphy sign noted by sonographer. Common bile duct: Diameter: 2.4 mm.  Biliary stent partially imaged. Liver: Heterogeneous appearance of the liver parenchyma with mild intrahepatic ductal dilatation noted in the porta hepatis. IMPRESSION: Minimal debris, biliary sludge or calculi along the dependent wall of the gallbladder without secondary signs acute cholecystitis. A biliary stent is partially imaged with mild intrahepatic ductal dilatation noted. A discrete liver lesion is not identified on images provided but better characterized on the most recent CT. Electronically Signed   By: Ashley Royalty M.D.   On: 12/08/2016 21:55        Scheduled Meds: . acyclovir ointment   Topical Q3H  . enoxaparin (LOVENOX) injection  40 mg Subcutaneous Q24H  . famotidine  20 mg Oral Daily  . lactose free nutrition  237 mL Oral TID WC  . metoCLOPramide  10 mg Oral QAC breakfast  . metroNIDAZOLE  500 mg Oral Q8H  . pantoprazole  40 mg Oral Daily  . potassium chloride SA  40 mEq Oral Daily  . vitamin B-12  2,500 mcg Oral Daily   Continuous Infusions: . cefTRIAXone (ROCEPHIN)  IV 2 g (12/10/16 1730)     LOS: 3 days    Time spent: 25 minutes  Greater than 50% of the  time spent on counseling and coordinating the care.   Leisa Lenz, MD [facility] Pager [phone]  If 7PM-7AM, please contact night-coverage [email or web address] Password [email or web address] 12/11/2016, 2:44 PM

## 2016-12-12 LAB — CBC
HCT: 28.8 % — ABNORMAL LOW (ref 39.0–52.0)
Hemoglobin: 10 g/dL — ABNORMAL LOW (ref 13.0–17.0)
MCH: 31.5 pg (ref 26.0–34.0)
MCHC: 34.7 g/dL (ref 30.0–36.0)
MCV: 90.9 fL (ref 78.0–100.0)
Platelets: 155 10*3/uL (ref 150–400)
RBC: 3.17 MIL/uL — ABNORMAL LOW (ref 4.22–5.81)
RDW: 14.7 % (ref 11.5–15.5)
WBC: 2 10*3/uL — ABNORMAL LOW (ref 4.0–10.5)

## 2016-12-12 LAB — BASIC METABOLIC PANEL
Anion gap: 9 (ref 5–15)
BUN: 7 mg/dL (ref 6–20)
CALCIUM: 8.4 mg/dL — AB (ref 8.9–10.3)
CO2: 25 mmol/L (ref 22–32)
CREATININE: 0.69 mg/dL (ref 0.61–1.24)
Chloride: 101 mmol/L (ref 101–111)
GFR calc non Af Amer: >60 mL/min (ref >60)
Glucose, Bld: 98 mg/dL (ref 65–99)
Potassium: 3.4 mmol/L — ABNORMAL LOW (ref 3.5–5.1)
SODIUM: 135 mmol/L (ref 135–145)

## 2016-12-12 MED ORDER — METOCLOPRAMIDE HCL 10 MG PO TABS
10.0000 mg | ORAL_TABLET | Freq: Three times a day (TID) | ORAL | Status: DC
  Administered 2016-12-12 – 2016-12-18 (×16): 10 mg via ORAL
  Filled 2016-12-12 (×16): qty 1

## 2016-12-12 MED ORDER — ATENOLOL 50 MG PO TABS
50.0000 mg | ORAL_TABLET | Freq: Every day | ORAL | Status: DC
  Administered 2016-12-13 – 2016-12-18 (×5): 50 mg via ORAL
  Filled 2016-12-12 (×5): qty 1

## 2016-12-12 MED ORDER — SODIUM CHLORIDE 0.9 % IV SOLN
3.0000 g | Freq: Four times a day (QID) | INTRAVENOUS | Status: DC
  Administered 2016-12-12 – 2016-12-18 (×24): 3 g via INTRAVENOUS
  Filled 2016-12-12 (×25): qty 3

## 2016-12-12 MED ORDER — ATENOLOL 50 MG PO TABS: 50.0000 mg | ORAL_TABLET | Freq: Every day | ORAL | Status: DC

## 2016-12-12 NOTE — Progress Notes (Signed)
Patient ID: Bryan Fields, male   DOB: 11/23/1960, 56 y.o.   MRN: [id number]  PROGRESS NOTE    Bryan Fields  JXB:[id number] DOB: 24-Mar-1961 DOA: 12/08/2016  PCP: Ria Bush, MD   Brief Narrative:  56 year old male with history of metastatic colon cancer, scheduled to begin cycle 2 salvage chemotherapy with Lonsurf 12/14/16). Pt presented to ED with sudden onset fever, chills and diarrhea started 1 day prior to the admission. On admission, pt ANC was 1,400. CXR and UA were not suggestive of an acute infectious process. He was started on empiric vanco and zosyn while awaiting culture results and stool studies. Stool studies have not been collected as his diarrhea has stopped in hospital.   Assessment & Plan:   Principal Problem:   Febrile neutropenia (HCC) - WBC count better this am, 2.0 - Feels better - Continue Rocephin and flagyl    Active Problems:   Sepsis due to Klebsiella pneumoniae  - Sepsis criteria met on admission with fever, tachypnea, neutropenia - Blood cultures grew klebsiella pneumoniae species - Continue Rocephin and flagyl     Essential hypertension - Continue atenolol      Elevated LFTs / Metastatic colon cancer to liver (HCC) - Biliary stent in place     Pancytopenia (HCC) - Counts stable    Nausea vomiting and diarrhea - Stool neg for C.diff - Increased reglan to TID ac regimen for better control of nausea     Hypokalemia - Due to GI losses - Will continue to supplement    DVT prophylaxis: Lovenox subQ Code Status: full code  Family Communication: updated wife at bedside this am Disposition Plan: home in am if he feels better    Consultants:   Oncology  Procedures:   None   Antimicrobials:   Vanco and zosyn 7/2 - 7/4  Rocephin and flagyl 7/4 -->   Subjective: No overnight events.  Objective: Vitals:   12/11/16 0503 12/11/16 1425 12/11/16 2102 12/12/16 0505  BP: 120/73 (!) 150/76 136/81 128/86  Pulse: 84 73 91 91   Resp: _0 Temp: 98.6 F (37 C) 98.2 F (36.8 C) 98.7 F (37.1 C) 99.2 F (37.3 C)  TempSrc: Oral Oral Oral Oral  SpO2: 99% 100% 100% 100%  Weight:      Height:        Intake/Output Summary (Last 24 hours) at 12/12/16 1213 Last data filed at 12/12/16 1156  Gross per 24 hour  Intake             1440 ml  Output                0 ml  Net             1440 ml   Filed Weights   12/08/16 1653 12/08/16 2158 12/09/16 0605  Weight: 105.2 kg (232 lb) 110.5 kg (243 lb 9.7 oz) 110.5 kg (243 lb 8 oz)    Examination:  Physical Exam  Constitutional: Appears well-developed and well-nourished. No distress.   CVS: RRR, S1/S2 + Pulmonary: Effort and breath sounds normal, no stridor, rhonchi, wheezes, rales.  Abdominal: Soft. BS +,  no distension, tenderness, rebound or guarding.  Musculoskeletal: Normal range of motion. No edema and no tenderness.  Lymphadenopathy: No lymphadenopathy noted, cervical, inguinal. Neuro: Alert. Normal reflexes, muscle tone coordination. No cranial nerve deficit. Skin: Skin is warm and dry.  Psychiatric: Normal mood and affect. Behavior, judgment, thought content normal.  Data Reviewed: I have personally reviewed following labs and imaging studies  CBC:  Recent Labs Lab 12/08/16 1726 12/09/16 0500 12/10/16 0357 12/11/16 0447 12/12/16 0605  WBC 1.9* 2.1* 1.9* 1.7* 2.0*  NEUTROABS 1.4* 1.2*  --   --   --   HGB 11.1* 9.9* 10.0* 9.8* 10.0*  HCT 32.1* 28.8* 28.9* 28.0* 28.8*  MCV 92.0 91.1 92.3 91.2 90.9  PLT 130* 116* 117* 140* 194   Basic Metabolic Panel:  Recent Labs Lab 12/08/16 1726 12/09/16 0500 12/10/16 0357 12/11/16 0447 12/12/16 0605  NA 134* 137 133* 136 135  K 3.3* 3.1* 4.0 3.3* 3.4*  CL 103 104 103 106 101  CO2 _0 GLUCOSE 120* 101* 125* 136* 98  BUN _1 CREATININE 1.15 1.07 1.07 0.84 0.69  CALCIUM 8.4* 7.8* 8.2* 8.3* 8.4*   GFR: Estimated Creatinine Clearance: 127.3 mL/min (by C-G formula  based on SCr of 0.69 mg/dL). Liver Function Tests:  Recent Labs Lab 12/08/16 1726 12/09/16 0500  AST 120* 64*  ALT 164* 114*  ALKPHOS 165* 131*  BILITOT 5.8* 3.9*  PROT 6.8 5.5*  ALBUMIN 3.2* 2.6*   No results for input(s): LIPASE, AMYLASE in the last 168 hours. No results for input(s): AMMONIA in the last 168 hours. Coagulation Profile: No results for input(s): INR, PROTIME in the last 168 hours. Cardiac Enzymes: No results for input(s): CKTOTAL, CKMB, CKMBINDEX, TROPONINI in the last 168 hours. BNP (last 3 results) No results for input(s): PROBNP in the last 8760 hours. HbA1C: No results for input(s): HGBA1C in the last 72 hours. CBG:  Recent Labs Lab 12/09/16 0853 12/10/16 0818  GLUCAP 119* 85   Lipid Profile: No results for input(s): CHOL, HDL, LDLCALC, TRIG, CHOLHDL, LDLDIRECT in the last 72 hours. Thyroid Function Tests: No results for input(s): TSH, T4TOTAL, FREET4, T3FREE, THYROIDAB in the last 72 hours. Anemia Panel: No results for input(s): VITAMINB12, FOLATE, FERRITIN, TIBC, IRON, RETICCTPCT in the last 72 hours. Urine analysis:    Component Value Date/Time   COLORURINE AMBER (A) 12/08/2016 1708   APPEARANCEUR HAZY (A) 12/08/2016 1708   LABSPEC 1.035 (H) 12/08/2016 1708   LABSPEC 1.020 03/18/2015 0847   PHURINE 5.0 12/08/2016 1708   GLUCOSEU NEGATIVE 12/08/2016 1708   GLUCOSEU Negative 03/18/2015 0847   HGBUR NEGATIVE 12/08/2016 1708   HGBUR small 04/01/2010 0848   BILIRUBINUR MODERATE (A) 12/08/2016 1708   BILIRUBINUR Negative 03/18/2015 0847   KETONESUR NEGATIVE 12/08/2016 1708   PROTEINUR 100 (A) 12/08/2016 1708   UROBILINOGEN 0.2 03/18/2015 0847   NITRITE NEGATIVE 12/08/2016 1708   LEUKOCYTESUR NEGATIVE 12/08/2016 1708   LEUKOCYTESUR Negative 03/18/2015 0847   Sepsis Labs: _2 (procalcitonin:4,lacticidven:4)   ) Recent Results (from the past 240 hour(s))  Blood Culture (routine x 2)     Status: None (Preliminary result)    Collection Time: 12/08/16  5:08 PM  Result Value Ref Range Status   Specimen Description BLOOD RIGHT ANTECUBITAL  Final   Special Requests   Final    BOTTLES DRAWN AEROBIC AND ANAEROBIC Blood Culture adequate volume   Culture   Final    NO GROWTH 4 DAYS Performed at [facility], [place] [address] [place], [place] [address]    Report Status PENDING  Incomplete  Urine culture     Status: Abnormal   Collection Time: 12/08/16  5:08 PM  Result Value Ref Range Status   Specimen Description URINE, RANDOM  Final   Special  Requests Immunocompromised  Final   Culture MULTIPLE SPECIES PRESENT, SUGGEST RECOLLECTION (A)  Final   Report Status 12/10/2016 FINAL  Final  Blood Culture (routine x 2)     Status: Abnormal   Collection Time: 12/08/16  7:02 PM  Result Value Ref Range Status   Specimen Description LEFT ANTECUBITAL  Final   Special Requests IN PEDIATRIC BOTTLE Blood Culture adequate volume  Final   Culture  Setup Time   Final    GRAM NEGATIVE RODS IN PEDIATRIC BOTTLE CRITICAL RESULT CALLED TO, READ BACK BY AND VERIFIED WITH: Christean Grief 10:30 12/09/16 ([email or web address]) Performed at [facility], [address], [place], [place] [address]    Culture KLEBSIELLA PNEUMONIAE (A)  Final   Report Status 12/11/2016 FINAL  Final   Organism ID, Bacteria KLEBSIELLA PNEUMONIAE  Final      Susceptibility   Klebsiella pneumoniae - MIC*    AMPICILLIN RESISTANT Resistant     CEFAZOLIN <=4 SENSITIVE Sensitive     CEFEPIME <=1 SENSITIVE Sensitive     CEFTAZIDIME <=1 SENSITIVE Sensitive     CEFTRIAXONE <=1 SENSITIVE Sensitive     CIPROFLOXACIN <=0.25 SENSITIVE Sensitive     GENTAMICIN <=1 SENSITIVE Sensitive     IMIPENEM <=0.25 SENSITIVE Sensitive     TRIMETH/SULFA <=20 SENSITIVE Sensitive     AMPICILLIN/SULBACTAM 4 SENSITIVE Sensitive     PIP/TAZO <=4 SENSITIVE Sensitive     Extended ESBL NEGATIVE Sensitive     * KLEBSIELLA PNEUMONIAE  Blood Culture ID Panel (Reflexed)     Status: Abnormal    Collection Time: 12/08/16  7:02 PM  Result Value Ref Range Status   Enterococcus species NOT DETECTED NOT DETECTED Final   Listeria monocytogenes NOT DETECTED NOT DETECTED Final   Staphylococcus species NOT DETECTED NOT DETECTED Final   Staphylococcus aureus NOT DETECTED NOT DETECTED Final   Streptococcus species NOT DETECTED NOT DETECTED Final   Streptococcus agalactiae NOT DETECTED NOT DETECTED Final   Streptococcus pneumoniae NOT DETECTED NOT DETECTED Final   Streptococcus pyogenes NOT DETECTED NOT DETECTED Final   Acinetobacter baumannii NOT DETECTED NOT DETECTED Final   Enterobacteriaceae species DETECTED (A) NOT DETECTED Final    Comment: Enterobacteriaceae represent a large family of gram-negative bacteria, not a single organism. CRITICAL RESULT CALLED TO, READ BACK BY AND VERIFIED WITH: 10:30 12/09/16 ([email or web address])    Enterobacter cloacae complex NOT DETECTED NOT DETECTED Final   Escherichia coli NOT DETECTED NOT DETECTED Final   Klebsiella oxytoca NOT DETECTED NOT DETECTED Final   Klebsiella pneumoniae DETECTED (A) NOT DETECTED Final    Comment: CRITICAL RESULT CALLED TO, READ BACK BY AND VERIFIED WITH: Mila Merry.D. 10:30 12/09/16 ([email or web address])    Proteus species NOT DETECTED NOT DETECTED Final   Serratia marcescens NOT DETECTED NOT DETECTED Final   Carbapenem resistance NOT DETECTED NOT DETECTED Final   Haemophilus influenzae NOT DETECTED NOT DETECTED Final   Neisseria meningitidis NOT DETECTED NOT DETECTED Final   Pseudomonas aeruginosa NOT DETECTED NOT DETECTED Final   Candida albicans NOT DETECTED NOT DETECTED Final   Candida glabrata NOT DETECTED NOT DETECTED Final   Candida krusei NOT DETECTED NOT DETECTED Final   Candida parapsilosis NOT DETECTED NOT DETECTED Final   Candida tropicalis NOT DETECTED NOT DETECTED Final    Comment: Performed at [facility], [address] [place], [place] [address]  Culture, blood (routine x 2)     Status: None (Preliminary result)    Collection Time: 12/10/16  5:39 AM  Result Value Ref Range Status   Specimen Description BLOOD RIGHT ARM  Final   Special Requests   Final    BOTTLES DRAWN AEROBIC AND ANAEROBIC Blood Culture adequate volume   Culture   Final    NO GROWTH 2 DAYS Performed at [facility], [address] [place], [place] [address]    Report Status PENDING  Incomplete  Culture, blood (routine x 2)     Status: None (Preliminary result)   Collection Time: 12/10/16  5:46 AM  Result Value Ref Range Status   Specimen Description BLOOD LEFT ARM  Final   Special Requests   Final    BOTTLES DRAWN AEROBIC AND ANAEROBIC Blood Culture adequate volume   Culture   Final    NO GROWTH 2 DAYS Performed at [facility], [address] [place], [place] [address]    Report Status PENDING  Incomplete  C difficile quick scan w PCR reflex     Status: None   Collection Time: 12/10/16  6:53 PM  Result Value Ref Range Status   C Diff antigen NEGATIVE NEGATIVE Final   C Diff toxin NEGATIVE NEGATIVE Final   C Diff interpretation No C. difficile detected.  Final      Radiology Studies: Dg Abd Acute W/chest  Result Date: 12/08/2016 CLINICAL DATA:  Fever, nausea, vomiting and diarrhea since yesterday. History of metastatic colon carcinoma. EXAM: DG ABDOMEN ACUTE W/ 1V CHEST COMPARISON:  Plain films the abdomen 10/29/2016. CT chest, abdomen and pelvis 05/25/2016. FINDINGS: Single-view of the chest demonstrates a right IJ approach Port-A-Cath in place. Minimal atelectasis is seen in the left lung base. The lungs are otherwise clear. Heart size is normal. No pneumothorax or pleural effusion. Two views of the abdomen show no free intraperitoneal air. The bowel gas pattern is nonobstructive. Multiple surgical clips project in the upper abdomen. Common bile duct stent is in place. IMPRESSION: No acute finding chest or abdomen. Electronically Signed   By: Inge Rise M.D.   On: 12/08/2016 18:39   US Abdomen Limited  Ruq  Result Date: 12/08/2016 CLINICAL DATA:  Biliary obstruction due to cancer. Biliary stent placed on 10/15/2016. LFT and jaundice has normalized with recurrence of fever, nausea and vomiting. EXAM: ULTRASOUND ABDOMEN LIMITED RIGHT UPPER QUADRANT COMPARISON:  10/02/2016 CT FINDINGS: Gallbladder: Minimal debris, sludge or calculi along the dependent aspect of the gallbladder without wall thickening visualized. No sonographic Murphy sign noted by sonographer. Common bile duct: Diameter: 2.4 mm.  Biliary stent partially imaged. Liver: Heterogeneous appearance of the liver parenchyma with mild intrahepatic ductal dilatation noted in the porta hepatis. IMPRESSION: Minimal debris, biliary sludge or calculi along the dependent wall of the gallbladder without secondary signs acute cholecystitis. A biliary stent is partially imaged with mild intrahepatic ductal dilatation noted. A discrete liver lesion is not identified on images provided but better characterized on the most recent CT. Electronically Signed   By: Ashley Royalty M.D.   On: 12/08/2016 21:55        Scheduled Meds: . acyclovir ointment   Topical Q3H  . enoxaparin (LOVENOX) injection  40 mg Subcutaneous Q24H  . famotidine  20 mg Oral Daily  . lactose free nutrition  237 mL Oral TID WC  . metoCLOPramide  10 mg Oral QAC breakfast  . metroNIDAZOLE  500 mg Oral Q8H  . pantoprazole  40 mg Oral Daily  . potassium chloride SA  40 mEq Oral Daily  . vitamin B-12  2,500 mcg  Oral Daily   Continuous Infusions: . cefTRIAXone (ROCEPHIN)  IV 2 g (12/11/16 1809)     LOS: 4 days    Time spent: 25 minutes  Greater than 50% of the time spent on counseling and coordinating the care.   Leisa Lenz, MD [facility] Pager [phone]  If 7PM-7AM, please contact night-coverage [email or web address] Password [email or web address] 12/12/2016, 12:13 PM

## 2016-12-13 LAB — COMPREHENSIVE METABOLIC PANEL
ALT: 75 U/L — AB (ref 17–63)
AST: 58 U/L — ABNORMAL HIGH (ref 15–41)
Albumin: 2.5 g/dL — ABNORMAL LOW (ref 3.5–5.0)
Alkaline Phosphatase: 236 U/L — ABNORMAL HIGH (ref 38–126)
Anion gap: 7 (ref 5–15)
BUN: 7 mg/dL (ref 6–20)
CHLORIDE: 102 mmol/L (ref 101–111)
CO2: 26 mmol/L (ref 22–32)
CREATININE: 0.65 mg/dL (ref 0.61–1.24)
Calcium: 8.5 mg/dL — ABNORMAL LOW (ref 8.9–10.3)
Glucose, Bld: 111 mg/dL — ABNORMAL HIGH (ref 65–99)
POTASSIUM: 3.5 mmol/L (ref 3.5–5.1)
SODIUM: 135 mmol/L (ref 135–145)
Total Bilirubin: 7.7 mg/dL — ABNORMAL HIGH (ref 0.3–1.2)
Total Protein: 6.1 g/dL — ABNORMAL LOW (ref 6.5–8.1)

## 2016-12-13 LAB — CULTURE, BLOOD (ROUTINE X 2)
CULTURE: NO GROWTH
SPECIAL REQUESTS: ADEQUATE

## 2016-12-13 LAB — CBC
HEMATOCRIT: 27.6 % — AB (ref 39.0–52.0)
Hemoglobin: 9.5 g/dL — ABNORMAL LOW (ref 13.0–17.0)
MCH: 31.4 pg (ref 26.0–34.0)
MCHC: 34.4 g/dL (ref 30.0–36.0)
MCV: 91.1 fL (ref 78.0–100.0)
PLATELETS: 167 10*3/uL (ref 150–400)
RBC: 3.03 MIL/uL — AB (ref 4.22–5.81)
RDW: 14.9 % (ref 11.5–15.5)
WBC: 2.3 10*3/uL — AB (ref 4.0–10.5)

## 2016-12-13 NOTE — Progress Notes (Addendum)
Patient ID: Bryan Fields, male   DOB: 1960-11-19, 55 y.o.   MRN: [id number]  PROGRESS NOTE    Bryan Fields  FIE:[id number] DOB: [birth year] DOA: 12/08/2016  PCP: Ria Bush, MD   Brief Narrative:  56 year old male with history of metastatic colon cancer, scheduled to begin cycle 2 salvage chemotherapy with Lonsurf 12/14/16). Pt presented to ED with sudden onset fever, chills and diarrhea started 1 day prior to the admission. On admission, pt ANC was 1,400. CXR and UA were not suggestive of an acute infectious process. He was started on empiric vanco and zosyn while awaiting culture results and stool studies. Stool studies have not been collected as his diarrhea has stopped in hospital.   Assessmnt & Plan:  Principal Problem: Febrile neutropenia (HCC) - WBC count better - Started Unasyn 7/7  Active Problems: Sepsis due to Klebsiella pneumoniae  - Sepsis criteria met on admission with fever, tachypnea, neutropenia - Blood cultures grew klebsiella pneumoniae species - Stopped Rocephin and flagyl 7/7 - Started Unasyn 7/7  Essential hypertension - Continue atenolol   Metastatic colon cancer to liver (HCC) / Abnormal LFT's - Stent in place - Bili higher this am  - Follow up LFT's in am - May need GI input in bili trendign up  Pancytopenia (HCC) - Stable   Nausea vomiting and diarrhea - Stool for C.diff neg - Diarrhea improved   Hypokalemia - Due to GI losses - Supplemented and WNL  DVT prophylaxis: Lovenox subQ Code Status: full code  Family Communication: wife at bedside  Disposition Plan: possible d/c in am if no fevers    Consultants:   Oncology   Procedures:   None   Antimicrobials:   Vanco and zosyn 7/2 - 7/4  Rocephin and flagyl 7/4 --> 7/7  Unasyn 7/7 -->   Subjective: No overnight events.  Objective: Vitals:   12/12/16 2214 12/12/16 2256 12/13/16 0500 12/13/16 1451  BP:  138/84 131/79 120/75  Pulse:  96 84 65    Resp:  '16 14 16  '$ Temp: 99.4 F (37.4 C) (!) 101.1 F (38.4 C) 98.6 F (37 C) 98.6 F (37 C)  TempSrc: Oral Oral Oral Oral  SpO2:  100% 99% 100%  Weight:      Height:        Intake/Output Summary (Last 24 hours) at 12/13/16 1605 Last data filed at 12/13/16 1452  Gross per 24 hour  Intake              940 ml  Output                0 ml  Net              940 ml   Filed Weights   12/08/16 1653 12/08/16 2158 12/09/16 0605  Weight: 105.2 kg (232 lb) 110.5 kg (243 lb 9.7 oz) 110.5 kg (243 lb 8 oz)    Examination:  General exam: Appears calm and comfortable, more jaundiced this am Respiratory system: Clear to auscultation. Respiratory effort normal. Cardiovascular system: S1 & S2 heard, Rate controlled  Gastrointestinal system: Abdomen is nondistended, soft and nontender. No organomegaly or masses felt. Normal bowel sounds heard. Central nervous system: Alert and oriented. No focal neurological deficits. Extremities: Symmetric 5 x 5 power. Skin: No rashes, lesions or ulcers Psychiatry: Judgement and insight appear normal. Mood & affect appropriate.   Data Reviewed: I have personally reviewed following labs and imaging studies  CBC:  Recent Labs Lab 12/08/16  1726 12/09/16 0500 12/10/16 0357 12/11/16 0447 12/12/16 0605 12/13/16 0515  WBC 1.9* 2.1* 1.9* 1.7* 2.0* 2.3*  NEUTROABS 1.4* 1.2*  --   --   --   --   HGB 11.1* 9.9* 10.0* 9.8* 10.0* 9.5*  HCT 32.1* 28.8* 28.9* 28.0* 28.8* 27.6*  MCV 92.0 91.1 92.3 91.2 90.9 91.1  PLT 130* 116* 117* 140* 155 767   Basic Metabolic Panel:  Recent Labs Lab 12/09/16 0500 12/10/16 0357 12/11/16 0447 12/12/16 0605 12/13/16 0515  NA 137 133* 136 135 135  K 3.1* 4.0 3.3* 3.4* 3.5  CL 104 103 106 101 102  CO2 '24 24 24 25 26  '$ GLUCOSE 101* 125* 136* 98 111*  BUN '16 17 14 7 7  '$ CREATININE 1.07 1.07 0.84 0.69 0.65  CALCIUM 7.8* 8.2* 8.3* 8.4* 8.5*   GFR: Estimated Creatinine Clearance: 127.3 mL/min (by C-G formula based on SCr  of 0.65 mg/dL). Liver Function Tests:  Recent Labs Lab 12/08/16 1726 12/09/16 0500 12/13/16 0515  AST 120* 64* 58*  ALT 164* 114* 75*  ALKPHOS 165* 131* 236*  BILITOT 5.8* 3.9* 7.7*   No results for input(s): LIPASE, AMYLASE in the last 168 hours. No results for input(s): AMMONIA in the last 168 hours. Coagulation Profile: No results for input(s): INR, PROTIME in the last 168 hours. Cardiac Enzymes: No results for input(s): CKTOTAL, CKMB, CKMBINDEX, TROPONINI in the last 168 hours. BNP (last 3 results) No results for input(s): PROBNP in the last 8760 hours. HbA1C: No results for input(s): HGBA1C in the last 72 hours. CBG:  Recent Labs Lab 12/09/16 0853 12/10/16 0818  GLUCAP 119* 85   Lipid Profile: No results for input(s): CHOL, HDL, LDLCALC, TRIG, CHOLHDL, LDLDIRECT in the last 72 hours. Thyroid Function Tests: No results for input(s): TSH, T4TOTAL, FREET4, T3FREE, THYROIDAB in the last 72 hours. Anemia Panel: No results for input(s): VITAMINB12, FOLATE, FERRITIN, TIBC, IRON, RETICCTPCT in the last 72 hours. Urine analysis:    Component Value Date/Time   COLORURINE AMBER (A) 12/08/2016 1708   APPEARANCEUR HAZY (A) 12/08/2016 1708   LABSPEC 1.035 (H) 12/08/2016 1708   LABSPEC 1.020 03/18/2015 0847   PHURINE 5.0 12/08/2016 1708   GLUCOSEU NEGATIVE 12/08/2016 1708   GLUCOSEU Negative 03/18/2015 0847   HGBUR NEGATIVE 12/08/2016 1708   HGBUR small 04/01/2010 0848   BILIRUBINUR MODERATE (A) 12/08/2016 1708   BILIRUBINUR Negative 03/18/2015 0847   KETONESUR NEGATIVE 12/08/2016 1708   PROTEINUR 100 (A) 12/08/2016 1708   UROBILINOGEN 0.2 03/18/2015 0847   NITRITE NEGATIVE 12/08/2016 1708   LEUKOCYTESUR NEGATIVE 12/08/2016 1708   LEUKOCYTESUR Negative 03/18/2015 0847   Sepsis Labs: '@LABRCNTIP'$ (procalcitonin:4,lacticidven:4)   ) Recent Results (from the past 240 hour(s))  Blood Culture (routine x 2)     Status: None   Collection Time: 12/08/16  5:08 PM  Result  Value Ref Range Status   Specimen Description BLOOD RIGHT ANTECUBITAL  Final   Special Requests   Final    BOTTLES DRAWN AEROBIC AND ANAEROBIC Blood Culture adequate volume   Culture   Final    NO GROWTH 5 DAYS Performed at [facility], [place] [address], [place], [place] [address]    Report Status 12/13/2016 FINAL  Final  Urine culture     Status: Abnormal   Collection Time: 12/08/16  5:08 PM  Result Value Ref Range Status   Specimen Description URINE, RANDOM  Final   Special Requests Immunocompromised  Final   Culture MULTIPLE SPECIES PRESENT, SUGGEST  RECOLLECTION (A)  Final   Report Status 12/10/2016 FINAL  Final  Blood Culture (routine x 2)     Status: Abnormal   Collection Time: 12/08/16  7:02 PM  Result Value Ref Range Status   Specimen Description LEFT ANTECUBITAL  Final   Special Requests IN PEDIATRIC BOTTLE Blood Culture adequate volume  Final   Culture  Setup Time   Final    GRAM NEGATIVE RODS IN PEDIATRIC BOTTLE CRITICAL RESULT CALLED TO, READ BACK BY AND VERIFIED WITH: Azzie Glatter 10:30 12/09/16 ([email or web address]) Performed at [facility], [address] [place], [place] [address]    Culture KLEBSIELLA PNEUMONIAE (A)  Final   Report Status 12/11/2016 FINAL  Final   Organism ID, Bacteria KLEBSIELLA PNEUMONIAE  Final      Susceptibility   Klebsiella pneumoniae - MIC*    AMPICILLIN RESISTANT Resistant     CEFAZOLIN <=4 SENSITIVE Sensitive     CEFEPIME <=1 SENSITIVE Sensitive     CEFTAZIDIME <=1 SENSITIVE Sensitive     CEFTRIAXONE <=1 SENSITIVE Sensitive     CIPROFLOXACIN <=0.25 SENSITIVE Sensitive     GENTAMICIN <=1 SENSITIVE Sensitive     IMIPENEM <=0.25 SENSITIVE Sensitive     TRIMETH/SULFA <=20 SENSITIVE Sensitive     AMPICILLIN/SULBACTAM 4 SENSITIVE Sensitive     PIP/TAZO <=4 SENSITIVE Sensitive     Extended ESBL NEGATIVE Sensitive     * KLEBSIELLA PNEUMONIAE  Blood Culture ID Panel (Reflexed)     Status: Abnormal   Collection Time: 12/08/16  7:02 PM  Result  Value Ref Range Status   Enterococcus species NOT DETECTED NOT DETECTED Final   Listeria monocytogenes NOT DETECTED NOT DETECTED Final   Staphylococcus species NOT DETECTED NOT DETECTED Final   Staphylococcus aureus NOT DETECTED NOT DETECTED Final   Streptococcus species NOT DETECTED NOT DETECTED Final   Streptococcus agalactiae NOT DETECTED NOT DETECTED Final   Streptococcus pneumoniae NOT DETECTED NOT DETECTED Final   Streptococcus pyogenes NOT DETECTED NOT DETECTED Final   Acinetobacter baumannii NOT DETECTED NOT DETECTED Final   Enterobacteriaceae species DETECTED (A) NOT DETECTED Final    Comment: Enterobacteriaceae represent a large family of gram-negative bacteria, not a single organism. CRITICAL RESULT CALLED TO, READ BACK BY AND VERIFIED WITH: 10:30 12/09/16 ([email or web address])    Enterobacter cloacae complex NOT DETECTED NOT DETECTED Final   Escherichia coli NOT DETECTED NOT DETECTED Final   Klebsiella oxytoca NOT DETECTED NOT DETECTED Final   Klebsiella pneumoniae DETECTED (A) NOT DETECTED Final    Comment: CRITICAL RESULT CALLED TO, READ BACK BY AND VERIFIED WITH: Mardee Postin.D. 10:30 12/09/16 ([email or web address])    Proteus species NOT DETECTED NOT DETECTED Final   Serratia marcescens NOT DETECTED NOT DETECTED Final   Carbapenem resistance NOT DETECTED NOT DETECTED Final   Haemophilus influenzae NOT DETECTED NOT DETECTED Final   Neisseria meningitidis NOT DETECTED NOT DETECTED Final   Pseudomonas aeruginosa NOT DETECTED NOT DETECTED Final   Candida albicans NOT DETECTED NOT DETECTED Final   Candida glabrata NOT DETECTED NOT DETECTED Final   Candida krusei NOT DETECTED NOT DETECTED Final   Candida parapsilosis NOT DETECTED NOT DETECTED Final   Candida tropicalis NOT DETECTED NOT DETECTED Final    Comment: Performed at [facility], [address] [place], [place] [address]  Culture, blood (routine x 2)     Status: None (Preliminary result)   Collection Time: 12/10/16  5:39 AM  Result  Value Ref Range Status   Specimen Description BLOOD RIGHT  ARM  Final   Special Requests   Final    BOTTLES DRAWN AEROBIC AND ANAEROBIC Blood Culture adequate volume   Culture   Final    NO GROWTH 3 DAYS Performed at [facility], [address] [place], [place] [address]    Report Status PENDING  Incomplete  Culture, blood (routine x 2)     Status: None (Preliminary result)   Collection Time: 12/10/16  5:46 AM  Result Value Ref Range Status   Specimen Description BLOOD LEFT ARM  Final   Special Requests   Final    BOTTLES DRAWN AEROBIC AND ANAEROBIC Blood Culture adequate volume   Culture   Final    NO GROWTH 3 DAYS Performed at [facility], [address], [place], [place] [address]    Report Status PENDING  Incomplete  C difficile quick scan w PCR reflex     Status: None   Collection Time: 12/10/16  6:53 PM  Result Value Ref Range Status   C Diff antigen NEGATIVE NEGATIVE Final   C Diff toxin NEGATIVE NEGATIVE Final   C Diff interpretation No C. difficile detected.  Final      Radiology Studies: No results found.      Scheduled Meds: . acyclovir ointment   Topical Q3H  . atenolol  50 mg Oral Daily  . enoxaparin (LOVENOX) injection  40 mg Subcutaneous Q24H  . famotidine  20 mg Oral Daily  . lactose free nutrition  237 mL Oral TID WC  . metoCLOPramide  10 mg Oral TID AC  . pantoprazole  40 mg Oral Daily  . potassium chloride SA  40 mEq Oral Daily  . vitamin B-12  2,500 mcg Oral Daily   Continuous Infusions: . ampicillin-sulbactam (UNASYN) IV Stopped (12/13/16 1325)     LOS: 5 days    Time spent: 25 minutes  Greater than 50% of the time spent on counseling and coordinating the care.   Leisa Lenz, MD [facility] Pager [phone]  If 7PM-7AM, please contact night-coverage [email or web address] Password [email or web address] 12/13/2016, 4:05 PM

## 2016-12-13 NOTE — Plan of Care (Signed)
Problem: Bowel/Gastric: Goal: Will not experience complications related to bowel motility Outcome: Progressing Diarrhea resolved with d/c of Flagyl

## 2016-12-13 NOTE — Plan of Care (Signed)
Problem: Physical Regulation: Goal: Ability to maintain clinical measurements within normal limits will improve Outcome: Not Progressing Developed fever of 101.1 7/7 HS.

## 2016-12-14 ENCOUNTER — Inpatient Hospital Stay (HOSPITAL_COMMUNITY): Payer: 59

## 2016-12-14 DIAGNOSIS — C189 Malignant neoplasm of colon, unspecified: Secondary | ICD-10-CM

## 2016-12-14 DIAGNOSIS — R7989 Other specified abnormal findings of blood chemistry: Secondary | ICD-10-CM

## 2016-12-14 LAB — COMPREHENSIVE METABOLIC PANEL
ALK PHOS: 265 U/L — AB (ref 38–126)
ALT: 74 U/L — AB (ref 17–63)
AST: 68 U/L — AB (ref 15–41)
Albumin: 2.7 g/dL — ABNORMAL LOW (ref 3.5–5.0)
Anion gap: 7 (ref 5–15)
BILIRUBIN TOTAL: 10 mg/dL — AB (ref 0.3–1.2)
BUN: 6 mg/dL (ref 6–20)
CALCIUM: 8.6 mg/dL — AB (ref 8.9–10.3)
CO2: 26 mmol/L (ref 22–32)
CREATININE: 0.6 mg/dL — AB (ref 0.61–1.24)
Chloride: 101 mmol/L (ref 101–111)
Glucose, Bld: 87 mg/dL (ref 65–99)
Potassium: 3.9 mmol/L (ref 3.5–5.1)
Sodium: 134 mmol/L — ABNORMAL LOW (ref 135–145)
TOTAL PROTEIN: 6.6 g/dL (ref 6.5–8.1)

## 2016-12-14 MED ORDER — IOPAMIDOL (ISOVUE-300) INJECTION 61%
INTRAVENOUS | Status: AC
  Administered 2016-12-14: 11:00:00
  Filled 2016-12-14: qty 30

## 2016-12-14 MED ORDER — IOPAMIDOL (ISOVUE-300) INJECTION 61%
INTRAVENOUS | Status: AC
  Administered 2016-12-14: 17:00:00
  Filled 2016-12-14: qty 100

## 2016-12-14 MED ORDER — IOPAMIDOL (ISOVUE-300) INJECTION 61%
15.0000 mL | Freq: Two times a day (BID) | INTRAVENOUS | Status: DC | PRN
  Administered 2016-12-14: 15 mL via ORAL
  Filled 2016-12-14: qty 30

## 2016-12-14 MED ORDER — IOPAMIDOL (ISOVUE-300) INJECTION 61%
100.0000 mL | Freq: Once | INTRAVENOUS | Status: AC | PRN
  Administered 2016-12-14: 100 mL via INTRAVENOUS
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
## 2016-12-14 NOTE — Progress Notes (Signed)
Patient ID: Bryan Fields, male   DOB: 06/02/1961, 56 y.o.   MRN: [id number]  PROGRESS NOTE    Jedediah R Bartoszek  MRN:[id number] DOB: 12/28/1960 DOA: 12/08/2016  PCP: Gutierrez, Javier, MD   Brief Narrative:  56 year old male with history of metastatic colon cancer, scheduled to begin cycle 2 salvage chemotherapy with Lonsurf 12/14/16). Pt presented to ED with sudden onset fever, chills and diarrhea started 1 day prior to the admission. On admission, pt ANC was 1,400. CXR and UA were not suggestive of an acute infectious process. He was started on empiric vanco and zosyn while awaiting culture results and stool studies. Stool studies have not been collected as his diarrhea has stopped in hospital.   Assessmnt & Plan:  Principal Problem: Febrile neutropenia (HCC) - WBC count better - Continue unasyn   Active Problems: Sepsis due to Klebsiella pneumoniae  - Sepsis criteria met on admission with fever, tachypnea, neutropenia - Blood cultures grew klebsiella pneumoniae species - Stopped Rocephin and flagyl 7/7 - Continue Unasyn, started 7/7  Essential hypertension - Continue atenolol   Metastatic colon cancer to liver (HCC) / Abnormal LFT's - Stent in place - Bili is trending up and is 10 this am and pt more jaundiced this am - GI to see in consultation  Pancytopenia (HCC) - Stable   Nausea vomiting and diarrhea - Stool for C.diff neg - Diarrhea resolved   Hypokalemia - Due to GI losses - Supplemented   DVT prophylaxis: Lovenox subQ Code Status: full code  Family Communication: wife at the bedside this am Disposition Plan: will need to be seen by GI for worsening bilirubin    Consultants:   Oncology   GI, consulted 7/9  Procedures:   None   Antimicrobials:   Vanco and zosyn 7/2 - 7/4  Rocephin and flagyl 7/4 --> 7/7  Unasyn 7/7 -->   Subjective: No overnight events.  Objective: Vitals:   12/13/16 1451 12/13/16 1956 12/13/16 2043  12/14/16 0630  BP: 120/75  125/76 124/78  Pulse: 65  70 77  Resp: 16  15 18  Temp: 98.6 F (37 C) (!) 100.5 F (38.1 C) 99.6 F (37.6 C) 98.9 F (37.2 C)  TempSrc: Oral Oral Oral Oral  SpO2: 100%  96% 99%  Weight:      Height:        Intake/Output Summary (Last 24 hours) at 12/14/16 1147 Last data filed at 12/13/16 1452  Gross per 24 hour  Intake              240 ml  Output                0 ml  Net              240 ml   Filed Weights   12/08/16 1653 12/08/16 2158 12/09/16 0605  Weight: 105.2 kg (232 lb) 110.5 kg (243 lb 9.7 oz) 110.5 kg (243 lb 8 oz)    Examination:  Physical Exam  Constitutional: Appears well-developed and well-nourished. No distress.  CVS: RRR, S1/S2 +, no murmurs, no gallops, no carotid bruit.  Pulmonary: Effort and breath sounds normal, no stridor, rhonchi, wheezes, rales.  Abdominal: Soft. BS +,  no distension, tenderness, rebound or guarding.  Musculoskeletal: Normal range of motion. No edema and no tenderness.  Lymphadenopathy: No lymphadenopathy noted, cervical, inguinal. Neuro: Alert. Normal reflexes, muscle tone coordination. No cranial nerve deficit. Skin: More jaundiced this am Psychiatric: Normal mood and affect. Behavior, judgment,   thought content normal.    Data Reviewed: I have personally reviewed following labs and imaging studies  CBC:  Recent Labs Lab 12/08/16 1726 12/09/16 0500 12/10/16 0357 12/11/16 0447 12/12/16 0605 12/13/16 0515  WBC 1.9* 2.1* 1.9* 1.7* 2.0* 2.3*  NEUTROABS 1.4* 1.2*  --   --   --   --   HGB 11.1* 9.9* 10.0* 9.8* 10.0* 9.5*  HCT 32.1* 28.8* 28.9* 28.0* 28.8* 27.6*  MCV 92.0 91.1 92.3 91.2 90.9 91.1  PLT 130* 116* 117* 140* 155 167   Basic Metabolic Panel:  Recent Labs Lab 12/10/16 0357 12/11/16 0447 12/12/16 0605 12/13/16 0515 12/14/16 0524  NA 133* 136 135 135 134*  K 4.0 3.3* 3.4* 3.5 3.9  CL 103 106 101 102 101  CO2 24 24 25 26 26  GLUCOSE 125* 136* 98 111* 87  BUN 17 14 7 7 6    CREATININE 1.07 0.84 0.69 0.65 0.60*  CALCIUM 8.2* 8.3* 8.4* 8.5* 8.6*   GFR: Estimated Creatinine Clearance: 127.3 mL/min (A) (by C-G formula based on SCr of 0.6 mg/dL (L)). Liver Function Tests:  Recent Labs Lab 12/08/16 1726 12/09/16 0500 12/13/16 0515  AST 120* 64* 58*  ALT 164* 114* 75*  ALKPHOS 165* 131* 236*  BILITOT 5.8* 3.9* 7.7*   No results for input(s): LIPASE, AMYLASE in the last 168 hours. No results for input(s): AMMONIA in the last 168 hours. Coagulation Profile: No results for input(s): INR, PROTIME in the last 168 hours. Cardiac Enzymes: No results for input(s): CKTOTAL, CKMB, CKMBINDEX, TROPONINI in the last 168 hours. BNP (last 3 results) No results for input(s): PROBNP in the last 8760 hours. HbA1C: No results for input(s): HGBA1C in the last 72 hours. CBG:  Recent Labs Lab 12/09/16 0853 12/10/16 0818  GLUCAP 119* 85   Lipid Profile: No results for input(s): CHOL, HDL, LDLCALC, TRIG, CHOLHDL, LDLDIRECT in the last 72 hours. Thyroid Function Tests: No results for input(s): TSH, T4TOTAL, FREET4, T3FREE, THYROIDAB in the last 72 hours. Anemia Panel: No results for input(s): VITAMINB12, FOLATE, FERRITIN, TIBC, IRON, RETICCTPCT in the last 72 hours. Urine analysis:    Component Value Date/Time   COLORURINE AMBER (A) 12/08/2016 1708   APPEARANCEUR HAZY (A) 12/08/2016 1708   LABSPEC 1.035 (H) 12/08/2016 1708   LABSPEC 1.020 03/18/2015 0847   PHURINE 5.0 12/08/2016 1708   GLUCOSEU NEGATIVE 12/08/2016 1708   GLUCOSEU Negative 03/18/2015 0847   HGBUR NEGATIVE 12/08/2016 1708   HGBUR small 04/01/2010 0848   BILIRUBINUR MODERATE (A) 12/08/2016 1708   BILIRUBINUR Negative 03/18/2015 0847   KETONESUR NEGATIVE 12/08/2016 1708   PROTEINUR 100 (A) 12/08/2016 1708   UROBILINOGEN 0.2 03/18/2015 0847   NITRITE NEGATIVE 12/08/2016 1708   LEUKOCYTESUR NEGATIVE 12/08/2016 1708   LEUKOCYTESUR Negative 03/18/2015 0847   Sepsis  Labs: @LABRCNTIP(procalcitonin:4,lacticidven:4)   ) Recent Results (from the past 240 hour(s))  Blood Culture (routine x 2)     Status: None   Collection Time: 12/08/16  5:08 PM  Result Value Ref Range Status   Specimen Description BLOOD RIGHT ANTECUBITAL  Final   Special Requests   Final    BOTTLES DRAWN AEROBIC AND ANAEROBIC Blood Culture adequate volume   Culture   Final    NO GROWTH 5 DAYS Performed at [facility], [address] [place], [place] [address]    Report Status 12/13/2016 FINAL  Final  Urine culture     Status: Abnormal   Collection Time: 12/08/16  5:08 PM    Result Value Ref Range Status   Specimen Description URINE, RANDOM  Final   Special Requests Immunocompromised  Final   Culture MULTIPLE SPECIES PRESENT, SUGGEST RECOLLECTION (A)  Final   Report Status 12/10/2016 FINAL  Final  Blood Culture (routine x 2)     Status: Abnormal   Collection Time: 12/08/16  7:02 PM  Result Value Ref Range Status   Specimen Description LEFT ANTECUBITAL  Final   Special Requests IN PEDIATRIC BOTTLE Blood Culture adequate volume  Final   Culture  Setup Time   Final    GRAM NEGATIVE RODS IN PEDIATRIC BOTTLE CRITICAL RESULT CALLED TO, READ BACK BY AND VERIFIED WITH: J. Legge 10:30 12/09/16 ([email or web address]) Performed at [facility], [address] [place], [place] [address]    Culture KLEBSIELLA PNEUMONIAE (A)  Final   Report Status 12/11/2016 FINAL  Final   Organism ID, Bacteria KLEBSIELLA PNEUMONIAE  Final      Susceptibility   Klebsiella pneumoniae - MIC*    AMPICILLIN RESISTANT Resistant     CEFAZOLIN <=4 SENSITIVE Sensitive     CEFEPIME <=1 SENSITIVE Sensitive     CEFTAZIDIME <=1 SENSITIVE Sensitive     CEFTRIAXONE <=1 SENSITIVE Sensitive     CIPROFLOXACIN <=0.25 SENSITIVE Sensitive     GENTAMICIN <=1 SENSITIVE Sensitive     IMIPENEM <=0.25 SENSITIVE Sensitive     TRIMETH/SULFA <=20 SENSITIVE Sensitive     AMPICILLIN/SULBACTAM 4 SENSITIVE Sensitive     PIP/TAZO  <=4 SENSITIVE Sensitive     Extended ESBL NEGATIVE Sensitive     * KLEBSIELLA PNEUMONIAE  Blood Culture ID Panel (Reflexed)     Status: Abnormal   Collection Time: 12/08/16  7:02 PM  Result Value Ref Range Status   Enterococcus species NOT DETECTED NOT DETECTED Final   Listeria monocytogenes NOT DETECTED NOT DETECTED Final   Staphylococcus species NOT DETECTED NOT DETECTED Final   Staphylococcus aureus NOT DETECTED NOT DETECTED Final   Streptococcus species NOT DETECTED NOT DETECTED Final   Streptococcus agalactiae NOT DETECTED NOT DETECTED Final   Streptococcus pneumoniae NOT DETECTED NOT DETECTED Final   Streptococcus pyogenes NOT DETECTED NOT DETECTED Final   Acinetobacter baumannii NOT DETECTED NOT DETECTED Final   Enterobacteriaceae species DETECTED (A) NOT DETECTED Final    Comment: Enterobacteriaceae represent a large family of gram-negative bacteria, not a single organism. CRITICAL RESULT CALLED TO, READ BACK BY AND VERIFIED WITH: 10:30 12/09/16 ([email or web address])    Enterobacter cloacae complex NOT DETECTED NOT DETECTED Final   Escherichia coli NOT DETECTED NOT DETECTED Final   Klebsiella oxytoca NOT DETECTED NOT DETECTED Final   Klebsiella pneumoniae DETECTED (A) NOT DETECTED Final    Comment: CRITICAL RESULT CALLED TO, READ BACK BY AND VERIFIED WITH: J. Legge Pharm.D. 10:30 12/09/16 ([email or web address])    Proteus species NOT DETECTED NOT DETECTED Final   Serratia marcescens NOT DETECTED NOT DETECTED Final   Carbapenem resistance NOT DETECTED NOT DETECTED Final   Haemophilus influenzae NOT DETECTED NOT DETECTED Final   Neisseria meningitidis NOT DETECTED NOT DETECTED Final   Pseudomonas aeruginosa NOT DETECTED NOT DETECTED Final   Candida albicans NOT DETECTED NOT DETECTED Final   Candida glabrata NOT DETECTED NOT DETECTED Final   Candida krusei NOT DETECTED NOT DETECTED Final   Candida parapsilosis NOT DETECTED NOT DETECTED Final   Candida tropicalis NOT DETECTED NOT DETECTED Final     Comment: Performed at [facility], [address] [place], [place] [address]  Culture, blood (routine x 2)       Status: None (Preliminary result)   Collection Time: 12/10/16  5:39 AM  Result Value Ref Range Status   Specimen Description BLOOD RIGHT ARM  Final   Special Requests   Final    BOTTLES DRAWN AEROBIC AND ANAEROBIC Blood Culture adequate volume   Culture   Final    NO GROWTH 4 DAYS Performed at [facility], [address] [place], [place] [address]    Report Status PENDING  Incomplete  Culture, blood (routine x 2)     Status: None (Preliminary result)   Collection Time: 12/10/16  5:46 AM  Result Value Ref Range Status   Specimen Description BLOOD LEFT ARM  Final   Special Requests   Final    BOTTLES DRAWN AEROBIC AND ANAEROBIC Blood Culture adequate volume   Culture   Final    NO GROWTH 4 DAYS Performed at [facility], [address] [place], [place] [address]    Report Status PENDING  Incomplete  C difficile quick scan w PCR reflex     Status: None   Collection Time: 12/10/16  6:53 PM  Result Value Ref Range Status   C Diff antigen NEGATIVE NEGATIVE Final   C Diff toxin NEGATIVE NEGATIVE Final   C Diff interpretation No C. difficile detected.  Final      Radiology Studies: No results found.      Scheduled Meds: . iopamidol      . acyclovir ointment   Topical Q3H  . atenolol  50 mg Oral Daily  . enoxaparin (LOVENOX) injection  40 mg Subcutaneous Q24H  . famotidine  20 mg Oral Daily  . lactose free nutrition  237 mL Oral TID WC  . metoCLOPramide  10 mg Oral TID AC  . pantoprazole  40 mg Oral Daily  . potassium chloride SA  40 mEq Oral Daily  . vitamin B-12  2,500 mcg Oral Daily   Continuous Infusions: . ampicillin-sulbactam (UNASYN) IV 3 g (12/14/16 1108)     LOS: 6 days    Time spent: 25 minutes  Greater than 50% of the time spent on counseling and coordinating the care.   Alma Devine, MD [facility] Pager  [phone]  If 7PM-7AM, please contact night-coverage [email or web address] Password [email or web address] 12/14/2016, 11:47 AM     

## 2016-12-15 DIAGNOSIS — R748 Abnormal levels of other serum enzymes: Secondary | ICD-10-CM

## 2016-12-15 DIAGNOSIS — R17 Unspecified jaundice: Secondary | ICD-10-CM

## 2016-12-15 LAB — COMPREHENSIVE METABOLIC PANEL
ALBUMIN: 2.9 g/dL — AB (ref 3.5–5.0)
ALK PHOS: 346 U/L — AB (ref 38–126)
ALT: 82 U/L — ABNORMAL HIGH (ref 17–63)
AST: 86 U/L — AB (ref 15–41)
Anion gap: 10 (ref 5–15)
BILIRUBIN TOTAL: 13.3 mg/dL — AB (ref 0.3–1.2)
BUN: 6 mg/dL (ref 6–20)
CALCIUM: 8.8 mg/dL — AB (ref 8.9–10.3)
CO2: 26 mmol/L (ref 22–32)
Chloride: 97 mmol/L — ABNORMAL LOW (ref 101–111)
Creatinine, Ser: 0.62 mg/dL (ref 0.61–1.24)
GFR calc Af Amer: >60 mL/min (ref >60)
GFR calc non Af Amer: >60 mL/min (ref >60)
GLUCOSE: 150 mg/dL — AB (ref 65–99)
POTASSIUM: 3.7 mmol/L (ref 3.5–5.1)
Sodium: 133 mmol/L — ABNORMAL LOW (ref 135–145)
TOTAL PROTEIN: 7.1 g/dL (ref 6.5–8.1)

## 2016-12-15 LAB — CULTURE, BLOOD (ROUTINE X 2)
Culture: NO GROWTH
Culture: NO GROWTH
SPECIAL REQUESTS: ADEQUATE
Special Requests: ADEQUATE

## 2016-12-15 LAB — CBC
HEMATOCRIT: 29.4 % — AB (ref 39.0–52.0)
Hemoglobin: 10.3 g/dL — ABNORMAL LOW (ref 13.0–17.0)
MCH: 31.8 pg (ref 26.0–34.0)
MCHC: 35 g/dL (ref 30.0–36.0)
MCV: 90.7 fL (ref 78.0–100.0)
PLATELETS: 300 10*3/uL (ref 150–400)
RBC: 3.24 MIL/uL — ABNORMAL LOW (ref 4.22–5.81)
RDW: 15.5 % (ref 11.5–15.5)
WBC: 4.5 10*3/uL (ref 4.0–10.5)

## 2016-12-15 MED ORDER — SENNOSIDES-DOCUSATE SODIUM 8.6-50 MG PO TABS
1.0000 | ORAL_TABLET | Freq: Two times a day (BID) | ORAL | Status: DC
  Administered 2016-12-15 – 2016-12-18 (×4): 1 via ORAL
  Filled 2016-12-15 (×4): qty 1

## 2016-12-15 MED ORDER — HYDROMORPHONE HCL 2 MG PO TABS
2.0000 mg | ORAL_TABLET | ORAL | Status: DC | PRN
  Administered 2016-12-15 – 2016-12-18 (×6): 2 mg via ORAL
  Filled 2016-12-15 (×8): qty 1

## 2016-12-15 MED ORDER — POLYETHYLENE GLYCOL 3350 17 G PO PACK
17.0000 g | PACK | Freq: Two times a day (BID) | ORAL | Status: DC
  Administered 2016-12-15 (×2): 17 g via ORAL
  Filled 2016-12-15 (×3): qty 1

## 2016-12-15 NOTE — Progress Notes (Signed)
[facility] Progress Note  Subjective:  Feeling ok.  Minimal abdominal pain.  Eating small amounts.  Anxious to get better from this acute issue and go on vacation to the [place] on  7/21.  Objective:  Vital signs in last 24 hours: Temp:  [98.2 F (36.8 C)-100.3 F (37.9 C)] 99.1 F (37.3 C) (07/10 0529) Pulse Rate:  [77-80] 80 (07/10 0529) Resp:  [16-18] 18 (07/10 0529) BP: (113-120)/(67-76) 117/70 (07/10 0529) SpO2:  [97 %-98 %] 97 % (07/10 0529) Last BM Date: 12/14/16 General:  Alert, Well-developed, in NAD; jaundice noted. Heart:  Regular rate and rhythm; no murmurs Pulm:  CTAB.  No increased WOB. Abdomen:  Soft, non-distended.  BS present.  Minimal upper abdominal TTP.  Extremities:  Without edema. Neurologic:  Alert and oriented x 4;  grossly normal neurologically. Psych:  Alert and cooperative. Normal mood and affect.  Intake/Output from previous day: 07/09 0701 - 07/10 0700 In: 720 [P.O.:720] Out: -   Lab Results:  Recent Labs  12/13/16 0515 12/15/16 0825  WBC 2.3* 4.5  HGB 9.5* 10.3*  HCT 27.6* 29.4*  PLT 167 300   BMET  Recent Labs  12/13/16 0515 12/14/16 0524 12/15/16 0825  NA 135 134* 133*  K 3.5 3.9 3.7  CL 102 101 97*  CO2 26 26 26   GLUCOSE 111* 87 150*  BUN 7 6 6   CREATININE 0.65 0.60* 0.62  CALCIUM 8.5* 8.6* 8.8*   LFT  Recent Labs  12/15/16 0825  PROT 7.1  ALBUMIN 2.9*  AST 86*  ALT 82*  ALKPHOS 346*  BILITOT 13.3*   Ct Abdomen Pelvis W Contrast  Result Date: 12/14/2016 CLINICAL DATA:  Metastatic colon cancer. Sudden onset fever and chills with diarrhea 1 day ago. EXAM: CT ABDOMEN AND PELVIS WITH CONTRAST TECHNIQUE: Multidetector CT imaging of the abdomen and pelvis was performed using the standard protocol following bolus administration of intravenous contrast. CONTRAST:  2mL ISOVUE-300 IOPAMIDOL (ISOVUE-300) INJECTION 61%, 139mL ISOVUE-300 IOPAMIDOL (ISOVUE-300) INJECTION 61% COMPARISON:  Ultrasound exam  12/08/2016.  CT scan 10/02/2016 FINDINGS: Lower chest: Bilateral gynecomastia, incompletely visualized. Coronary artery calcification is noted. Hepatobiliary: Left hepatectomy. 2.9 x 3.7 cm mass along the resection margin of the liver is similar to prior. 16 mm lesion seen in the dome of the liver on prior study measures 2.2 cm today. There is a new 1.9 cm ill-defined low-density liver lesion seen in the right liver on image 17 series 2. A common duct stent is new in the interval and crosses a irregular and ill-defined soft tissue mass in the porta hepatis measuring 6.9 x 3.9 cm. The stent tracks from about the biliary confluence in the liver to the pancreas, but does not extend down the common bile duct into the duodenal lumen. There is mild intrahepatic biliary duct dilatation proximal to the stent, progressed in the interval since prior study. Gallbladder is distended with layering tiny gallstones. Gallbladder wall is ill-defined. There is a small amount of fluid in the hepatoduodenal ligament adjacent to the neck of the gallbladder and second portion of the duodenum. Pancreas: Diffusely atrophic. Spleen: No splenomegaly. No focal mass lesion. Adrenals/Urinary Tract: No adrenal nodule or mass. Kidneys are unremarkable. No evidence for hydroureter. The urinary bladder appears normal for the degree of distention. Stomach/Bowel: Stomach is decompressed. Duodenum is normally positioned as is the ligament of Treitz. No small bowel wall thickening. No small bowel dilatation. Right hemicolectomy. Vascular/Lymphatic: There is abdominal aortic atherosclerosis without aneurysm. Lymphadenopathy in  the porta hepatis is similar. No pelvic sidewall lymphadenopathy. Reproductive: The prostate gland and seminal vesicles have normal imaging features. Other: Left omental lesion measured at 3.0 cm previously is now 3.5 cm. 2.0 x 3.3 cm lesion in the right rectus sheath was 2.1 x 2.7 cm previously. There is trace fluid in the  inferior right liver which tracks down the right para colic gutter. Musculoskeletal: IMPRESSION: 1. Since the prior CT study, there has been a common duct stent placed with intrahepatic biliary duct dilatation on today's CT scan similar to a potentially minimally increased since the ultrasound exam 5 days ago. There is a distended gallbladder with small volume pericholecystic fluid and a small amount of fluid in the porta hepatis, down along the right liver and tracking in the right para colic gutter. Acute cholecystitis is a consideration. Given the biliary dilatation, cholangitis cannot be excluded by CT imaging. 2. Interval progression of pre-existing liver lesion within new metastatic lesion identified in the right liver today measuring 1.9 cm. 3. Persistent recurrent neoplasm along the resection margin of the liver and in the porta hepatis, not substantially changed in the interval since prior CT. Additional sites of metastatic disease are seen in the anterior abdominal wall and left omentum, also similar to prior. Electronically Signed   By: Misty Stanley M.D.   On: 12/14/2016 19:19   Assessment / Plan: 1. Elevated LFT's:  Increasing since time of admission, metal biliary stent placed 10/15/16-LFT's had initially trended down, now Bili at 13, concern for biliary stent obstruction on CT scan. 2. H/o Metastatic colon cancer to liver: s/p hemicolectomy.  Supposed to begin cycle 2 salvage chemotherapy soon. 3. Sepsis due to Klebsiella pneumoniae:  Has been treated with multiple antibiotics including vancomycin, Zosyn, Rocephin, metronidazole, and now Unasyn--started 7/7.  *Dr. Hilarie Fredrickson waiting to discuss with Dr. Carlean Purl regarding repeat ERCP with stent exchange.    LOS: 7 days   ZEHR, JESSICA D.  12/15/2016, 10:05 AM  Pager number [phone]

## 2016-12-15 NOTE — Progress Notes (Addendum)
Patient ID: Bryan Fields, male   DOB: 26-May-1961, 56 y.o.   MRN: [id number]  PROGRESS NOTE    Bryan Fields  KNL:[id number] DOB: 1961/03/28 DOA: 12/08/2016  PCP: Ria Bush, MD   Brief Narrative:  56 year old male with history of metastatic colon cancer, scheduled to begin cycle 2 salvage chemotherapy with Lonsurf 12/14/16). Pt presented to ED with sudden onset fever, chills and diarrhea started 1 day prior to the admission. On admission, pt ANC was 1,400. CXR and UA were not suggestive of an acute infectious process. He was started on empiric vanco and zosyn while awaiting culture results and stool studies. Stool studies have not been collected as his diarrhea has stopped in hospital.  Hospital course is complicated with rising bilirubin and concern for acute cholecystitis for which reason GI consulted and plan is for HIDA scan in am.   Assessment & Plan:   Principal Problem: Febrile neutropenia (HCC) - Neutropenia resolved but still spiking low grade fevers, likely coming from biliary source  - Pt on unasyn  Active Problems: Sepsis due to Klebsiella pneumoniae  - Sepsis criteria met on admission with fever, tachypnea, neutropenia - Blood cultures grew klebsiella pneumoniae species - Stopped Rocephin and flagyl 7/7 - Continue Unasyn, started 12/12/2016  Essential hypertension - Continue atenolol   Metastatic colon cancer to liver (HCC) / Abnormal LFT's - Stent in place - Pr GI, CT scan done 7/9; pt still with intrahepatic biliary ductal dilatation and also questionable gallbladder wall thickening and pericholecystic fluid? Acute cholecystitis - Plan for HIDA scan in am to evaluate for acute cholecystitis - May need percutaneous biliary drain as he is not surgical candidate - If HIDA negative, plan for ERCP - Bili is trending up and is 10 this am and pt more jaundiced this am - GI to see in consultation  Pancytopenia ([place]) - Stable - WBC count 4.5 -  Hgb stable at 10.3 and platelets WNL   Nausea vomiting and diarrhea - Stool for C.diff neg - Diarrhea resolved   Hypokalemia - Due to GI losses - Supplemented and WNL   DVT prophylaxis: Lovenox subQ Code Status: full code  Family Communication: wife at bedside this am Disposition Plan: bili trending up, needs HIDA scan   Consultants:   Oncology  GI, Dr. Zenovia Jarred  Procedures:  None  Antimicrobials:   Vanco and zosyn 7/2 - 7/4  Rocephin and flagyl 7/4 --> 7/7  Unasyn 7/7 -->   Subjective: No overnight events.  Objective: Vitals:   12/14/16 0630 12/14/16 1543 12/14/16 2210 12/15/16 0529  BP: 124/78 113/67 120/76 117/70  Pulse: 77 77 80 80  Resp: '18 18 16 18  '$ Temp: 98.9 F (37.2 C) 98.2 F (36.8 C) 100.3 F (37.9 C) 99.1 F (37.3 C)  TempSrc: Oral Oral Oral Oral  SpO2: 99% 97% 98% 97%  Weight:      Height:        Intake/Output Summary (Last 24 hours) at 12/15/16 1413 Last data filed at 12/15/16 0825  Gross per 24 hour  Intake              720 ml  Output                0 ml  Net              720 ml   Filed Weights   12/08/16 1653 12/08/16 2158 12/09/16 0605  Weight: 105.2 kg (232 lb) 110.5 kg (243 lb 9.7  oz) 110.5 kg (243 lb 8 oz)    Examination:  General exam: Appears calm and comfortable  Respiratory system: Clear to auscultation. Respiratory effort normal. Cardiovascular system: S1 & S2 heard, Rate controlled  Gastrointestinal system: Abdomen is nondistended, soft and nontender. No organomegaly or masses felt. Normal bowel sounds heard. Central nervous system: Alert and oriented. No focal neurological deficits. Extremities: Symmetric 5 x 5 power. Skin: jaundiced skin Psychiatry: Judgement and insight appear normal. Mood & affect appropriate.   Data Reviewed: I have personally reviewed following labs and imaging studies  CBC:  Recent Labs Lab 12/08/16 1726 12/09/16 0500 12/10/16 0357 12/11/16 0447 12/12/16 0605  12/13/16 0515 12/15/16 0825  WBC 1.9* 2.1* 1.9* 1.7* 2.0* 2.3* 4.5  NEUTROABS 1.4* 1.2*  --   --   --   --   --   HGB 11.1* 9.9* 10.0* 9.8* 10.0* 9.5* 10.3*  HCT 32.1* 28.8* 28.9* 28.0* 28.8* 27.6* 29.4*  MCV 92.0 91.1 92.3 91.2 90.9 91.1 90.7  PLT 130* 116* 117* 140* 155 167 492   Basic Metabolic Panel:  Recent Labs Lab 12/11/16 0447 12/12/16 0605 12/13/16 0515 12/14/16 0524 12/15/16 0825  NA 136 135 135 134* 133*  K 3.3* 3.4* 3.5 3.9 3.7  CL 106 101 102 101 97*  CO2 '24 25 26 26 26  '$ GLUCOSE 136* 98 111* 87 150*  BUN '14 7 7 6 6  '$ CREATININE 0.84 0.69 0.65 0.60* 0.62  CALCIUM 8.3* 8.4* 8.5* 8.6* 8.8*   GFR: Estimated Creatinine Clearance: 127.3 mL/min (by C-G formula based on SCr of 0.62 mg/dL). Liver Function Tests:  Recent Labs Lab 12/08/16 1726 12/09/16 0500 12/13/16 0515 12/14/16 0524 12/15/16 0825  AST 120* 64* 58* 68* 86*  ALT 164* 114* 75* 74* 82*  ALKPHOS 165* 131* 236* 265* 346*  BILITOT 5.8* 3.9* 7.7* 10.0* 13.3*  PROT 6.8 5.5* 6.1* 6.6 7.1  ALBUMIN 3.2* 2.6* 2.5* 2.7* 2.9*   No results for input(s): LIPASE, AMYLASE in the last 168 hours. No results for input(s): AMMONIA in the last 168 hours. Coagulation Profile: No results for input(s): INR, PROTIME in the last 168 hours. Cardiac Enzymes: No results for input(s): CKTOTAL, CKMB, CKMBINDEX, TROPONINI in the last 168 hours. BNP (last 3 results) No results for input(s): PROBNP in the last 8760 hours. HbA1C: No results for input(s): HGBA1C in the last 72 hours. CBG:  Recent Labs Lab 12/09/16 0853 12/10/16 0818  GLUCAP 119* 85   Lipid Profile: No results for input(s): CHOL, HDL, LDLCALC, TRIG, CHOLHDL, LDLDIRECT in the last 72 hours. Thyroid Function Tests: No results for input(s): TSH, T4TOTAL, FREET4, T3FREE, THYROIDAB in the last 72 hours. Anemia Panel: No results for input(s): VITAMINB12, FOLATE, FERRITIN, TIBC, IRON, RETICCTPCT in the last 72 hours. Urine analysis:    Component Value  Date/Time   COLORURINE AMBER (A) 12/08/2016 1708   APPEARANCEUR HAZY (A) 12/08/2016 1708   LABSPEC 1.035 (H) 12/08/2016 1708   LABSPEC 1.020 03/18/2015 0847   PHURINE 5.0 12/08/2016 1708   GLUCOSEU NEGATIVE 12/08/2016 1708   GLUCOSEU Negative 03/18/2015 0847   HGBUR NEGATIVE 12/08/2016 1708   HGBUR small 04/01/2010 0848   BILIRUBINUR MODERATE (A) 12/08/2016 1708   BILIRUBINUR Negative 03/18/2015 0847   KETONESUR NEGATIVE 12/08/2016 1708   PROTEINUR 100 (A) 12/08/2016 1708   UROBILINOGEN 0.2 03/18/2015 0847   NITRITE NEGATIVE 12/08/2016 1708   LEUKOCYTESUR NEGATIVE 12/08/2016 1708   LEUKOCYTESUR Negative 03/18/2015 0847   Sepsis Labs: '@LABRCNTIP'$ (procalcitonin:4,lacticidven:4)    Recent Results (from  the past 240 hour(s))  Blood Culture (routine x 2)     Status: None   Collection Time: 12/08/16  5:08 PM  Result Value Ref Range Status   Specimen Description BLOOD RIGHT ANTECUBITAL  Final   Special Requests   Final    BOTTLES DRAWN AEROBIC AND ANAEROBIC Blood Culture adequate volume   Culture   Final    NO GROWTH 5 DAYS Performed at [facility], [address] [place], [place] [address]    Report Status 12/13/2016 FINAL  Final  Urine culture     Status: Abnormal   Collection Time: 12/08/16  5:08 PM  Result Value Ref Range Status   Specimen Description URINE, RANDOM  Final   Special Requests Immunocompromised  Final   Culture MULTIPLE SPECIES PRESENT, SUGGEST RECOLLECTION (A)  Final   Report Status 12/10/2016 FINAL  Final  Blood Culture (routine x 2)     Status: Abnormal   Collection Time: 12/08/16  7:02 PM  Result Value Ref Range Status   Specimen Description LEFT ANTECUBITAL  Final   Special Requests IN PEDIATRIC BOTTLE Blood Culture adequate volume  Final   Culture  Setup Time   Final    GRAM NEGATIVE RODS IN PEDIATRIC BOTTLE CRITICAL RESULT CALLED TO, READ BACK BY AND VERIFIED WITH: Christean Grief 10:30 12/09/16 ([email or web address]) Performed at [facility], [place] [address] [place], [place] [address]    Culture KLEBSIELLA PNEUMONIAE (A)  Final   Report Status 12/11/2016 FINAL  Final   Organism ID, Bacteria KLEBSIELLA PNEUMONIAE  Final      Susceptibility   Klebsiella pneumoniae - MIC*    AMPICILLIN RESISTANT Resistant     CEFAZOLIN <=4 SENSITIVE Sensitive     CEFEPIME <=1 SENSITIVE Sensitive     CEFTAZIDIME <=1 SENSITIVE Sensitive     CEFTRIAXONE <=1 SENSITIVE Sensitive     CIPROFLOXACIN <=0.25 SENSITIVE Sensitive     GENTAMICIN <=1 SENSITIVE Sensitive     IMIPENEM <=0.25 SENSITIVE Sensitive     TRIMETH/SULFA <=20 SENSITIVE Sensitive     AMPICILLIN/SULBACTAM 4 SENSITIVE Sensitive     PIP/TAZO <=4 SENSITIVE Sensitive     Extended ESBL NEGATIVE Sensitive     * KLEBSIELLA PNEUMONIAE  Blood Culture ID Panel (Reflexed)     Status: Abnormal   Collection Time: 12/08/16  7:02 PM  Result Value Ref Range Status   Enterococcus species NOT DETECTED NOT DETECTED Final   Listeria monocytogenes NOT DETECTED NOT DETECTED Final   Staphylococcus species NOT DETECTED NOT DETECTED Final   Staphylococcus aureus NOT DETECTED NOT DETECTED Final   Streptococcus species NOT DETECTED NOT DETECTED Final   Streptococcus agalactiae NOT DETECTED NOT DETECTED Final   Streptococcus pneumoniae NOT DETECTED NOT DETECTED Final   Streptococcus pyogenes NOT DETECTED NOT DETECTED Final   Acinetobacter baumannii NOT DETECTED NOT DETECTED Final   Enterobacteriaceae species DETECTED (A) NOT DETECTED Final    Comment: Enterobacteriaceae represent a large family of gram-negative bacteria, not a single organism. CRITICAL RESULT CALLED TO, READ BACK BY AND VERIFIED WITH: 10:30 12/09/16 ([email or web address])    Enterobacter cloacae complex NOT DETECTED NOT DETECTED Final   Escherichia coli NOT DETECTED NOT DETECTED Final   Klebsiella oxytoca NOT DETECTED NOT DETECTED Final   Klebsiella pneumoniae DETECTED (A) NOT DETECTED Final    Comment: CRITICAL RESULT CALLED TO, READ BACK BY AND VERIFIED  WITH: Mila Merry.D. 10:30 12/09/16 ([email or web address])    Proteus species NOT DETECTED NOT DETECTED Final   Serratia  marcescens NOT DETECTED NOT DETECTED Final   Carbapenem resistance NOT DETECTED NOT DETECTED Final   Haemophilus influenzae NOT DETECTED NOT DETECTED Final   Neisseria meningitidis NOT DETECTED NOT DETECTED Final   Pseudomonas aeruginosa NOT DETECTED NOT DETECTED Final   Candida albicans NOT DETECTED NOT DETECTED Final   Candida glabrata NOT DETECTED NOT DETECTED Final   Candida krusei NOT DETECTED NOT DETECTED Final   Candida parapsilosis NOT DETECTED NOT DETECTED Final   Candida tropicalis NOT DETECTED NOT DETECTED Final    Comment: Performed at [facility], [place] [address], [place], [place] [address]  Culture, blood (routine x 2)     Status: None   Collection Time: 12/10/16  5:39 AM  Result Value Ref Range Status   Specimen Description BLOOD RIGHT ARM  Final   Special Requests   Final    BOTTLES DRAWN AEROBIC AND ANAEROBIC Blood Culture adequate volume   Culture   Final    NO GROWTH 5 DAYS Performed at [facility], [address] [place], [place] [address]    Report Status 12/15/2016 FINAL  Final  Culture, blood (routine x 2)     Status: None   Collection Time: 12/10/16  5:46 AM  Result Value Ref Range Status   Specimen Description BLOOD LEFT ARM  Final   Special Requests   Final    BOTTLES DRAWN AEROBIC AND ANAEROBIC Blood Culture adequate volume   Culture   Final    NO GROWTH 5 DAYS Performed at [facility], [place] [address], [place], [place] [address]    Report Status 12/15/2016 FINAL  Final  C difficile quick scan w PCR reflex     Status: None   Collection Time: 12/10/16  6:53 PM  Result Value Ref Range Status   C Diff antigen NEGATIVE NEGATIVE Final   C Diff toxin NEGATIVE NEGATIVE Final   C Diff interpretation No C. difficile detected.  Final      Radiology Studies: Ct Abdomen Pelvis W Contrast  Result Date: 12/14/2016 CLINICAL DATA:   Metastatic colon cancer. Sudden onset fever and chills with diarrhea 1 day ago. EXAM: CT ABDOMEN AND PELVIS WITH CONTRAST TECHNIQUE: Multidetector CT imaging of the abdomen and pelvis was performed using the standard protocol following bolus administration of intravenous contrast. CONTRAST:  31m ISOVUE-300 IOPAMIDOL (ISOVUE-300) INJECTION 61%, 1056mISOVUE-300 IOPAMIDOL (ISOVUE-300) INJECTION 61% COMPARISON:  Ultrasound exam 12/08/2016.  CT scan 10/02/2016 FINDINGS: Lower chest: Bilateral gynecomastia, incompletely visualized. Coronary artery calcification is noted. Hepatobiliary: Left hepatectomy. 2.9 x 3.7 cm mass along the resection margin of the liver is similar to prior. 16 mm lesion seen in the dome of the liver on prior study measures 2.2 cm today. There is a new 1.9 cm ill-defined low-density liver lesion seen in the right liver on image 17 series 2. A common duct stent is new in the interval and crosses a irregular and ill-defined soft tissue mass in the porta hepatis measuring 6.9 x 3.9 cm. The stent tracks from about the biliary confluence in the liver to the pancreas, but does not extend down the common bile duct into the duodenal lumen. There is mild intrahepatic biliary duct dilatation proximal to the stent, progressed in the interval since prior study. Gallbladder is distended with layering tiny gallstones. Gallbladder wall is ill-defined. There is a small amount of fluid in the hepatoduodenal ligament adjacent to the neck of the gallbladder and second portion of the duodenum. Pancreas: Diffusely atrophic. Spleen:  No splenomegaly. No focal mass lesion. Adrenals/Urinary Tract: No adrenal nodule or mass. Kidneys are unremarkable. No evidence for hydroureter. The urinary bladder appears normal for the degree of distention. Stomach/Bowel: Stomach is decompressed. Duodenum is normally positioned as is the ligament of Treitz. No small bowel wall thickening. No small bowel dilatation. Right hemicolectomy.  Vascular/Lymphatic: There is abdominal aortic atherosclerosis without aneurysm. Lymphadenopathy in the porta hepatis is similar. No pelvic sidewall lymphadenopathy. Reproductive: The prostate gland and seminal vesicles have normal imaging features. Other: Left omental lesion measured at 3.0 cm previously is now 3.5 cm. 2.0 x 3.3 cm lesion in the right rectus sheath was 2.1 x 2.7 cm previously. There is trace fluid in the inferior right liver which tracks down the right para colic gutter. Musculoskeletal: IMPRESSION: 1. Since the prior CT study, there has been a common duct stent placed with intrahepatic biliary duct dilatation on today's CT scan similar to a potentially minimally increased since the ultrasound exam 5 days ago. There is a distended gallbladder with small volume pericholecystic fluid and a small amount of fluid in the porta hepatis, down along the right liver and tracking in the right para colic gutter. Acute cholecystitis is a consideration. Given the biliary dilatation, cholangitis cannot be excluded by CT imaging. 2. Interval progression of pre-existing liver lesion within new metastatic lesion identified in the right liver today measuring 1.9 cm. 3. Persistent recurrent neoplasm along the resection margin of the liver and in the porta hepatis, not substantially changed in the interval since prior CT. Additional sites of metastatic disease are seen in the anterior abdominal wall and left omentum, also similar to prior. Electronically Signed   By: Misty Stanley M.D.   On: 12/14/2016 19:19        Scheduled Meds: . acyclovir ointment   Topical Q3H  . atenolol  50 mg Oral Daily  . enoxaparin (LOVENOX) injection  40 mg Subcutaneous Q24H  . famotidine  20 mg Oral Daily  . lactose free nutrition  237 mL Oral TID WC  . metoCLOPramide  10 mg Oral TID AC  . pantoprazole  40 mg Oral Daily  . polyethylene glycol  17 g Oral BID  . potassium chloride SA  40 mEq Oral Daily  . senna-docusate  1  tablet Oral BID  . vitamin B-12  2,500 mcg Oral Daily   Continuous Infusions: . ampicillin-sulbactam (UNASYN) IV 3 g (12/15/16 1151)     LOS: 7 days    Time spent: 35 minutes Greater than 50% of the time spent on counseling and coordinating the care.   Leisa Lenz, MD [facility] Pager [phone]  If 7PM-7AM, please contact night-coverage [email or web address] Password [email or web address] 12/15/2016, 2:13 PM

## 2016-12-15 NOTE — Progress Notes (Signed)
Nuclear Medicine called re: patient's hepatobiliary scan. It will not be done until tomorrow due to patient receiving dilaudid at 10:30 a.m. And needing to be NPO without narcotics for 6 hrs before scan. Alonza Bogus was paged

## 2016-12-16 ENCOUNTER — Inpatient Hospital Stay (HOSPITAL_COMMUNITY): Payer: 59 | Admitting: Anesthesiology

## 2016-12-16 ENCOUNTER — Encounter (HOSPITAL_COMMUNITY)
Admission: EM | Disposition: A | Discharge status: Home / Self Care | Payer: Self-pay | Source: Home / Self Care | Attending: Internal Medicine

## 2016-12-16 ENCOUNTER — Inpatient Hospital Stay (HOSPITAL_COMMUNITY): Payer: 59

## 2016-12-16 ENCOUNTER — Encounter (HOSPITAL_COMMUNITY): Payer: Self-pay | Admitting: *Deleted

## 2016-12-16 DIAGNOSIS — K831 Obstruction of bile duct: Secondary | ICD-10-CM

## 2016-12-16 DIAGNOSIS — C801 Malignant (primary) neoplasm, unspecified: Secondary | ICD-10-CM

## 2016-12-16 HISTORY — PX: ERCP: SHX5425

## 2016-12-16 LAB — COMPREHENSIVE METABOLIC PANEL
ALBUMIN: 2.7 g/dL — AB (ref 3.5–5.0)
ALT: 80 U/L — ABNORMAL HIGH (ref 17–63)
ANION GAP: 10 (ref 5–15)
AST: 84 U/L — AB (ref 15–41)
Alkaline Phosphatase: 351 U/L — ABNORMAL HIGH (ref 38–126)
BILIRUBIN TOTAL: 14.1 mg/dL — AB (ref 0.3–1.2)
BUN: 7 mg/dL (ref 6–20)
CHLORIDE: 98 mmol/L — AB (ref 101–111)
CO2: 25 mmol/L (ref 22–32)
Calcium: 8.4 mg/dL — ABNORMAL LOW (ref 8.9–10.3)
Creatinine, Ser: 0.64 mg/dL (ref 0.61–1.24)
GFR calc Af Amer: >60 mL/min (ref >60)
GFR calc non Af Amer: >60 mL/min (ref >60)
GLUCOSE: 96 mg/dL (ref 65–99)
POTASSIUM: 3.8 mmol/L (ref 3.5–5.1)
SODIUM: 133 mmol/L — AB (ref 135–145)
Total Protein: 6.7 g/dL (ref 6.5–8.1)

## 2016-12-16 SURGERY — ERCP, WITH INTERVENTION IF INDICATED
Anesthesia: General

## 2016-12-16 MED ORDER — TECHNETIUM TC 99M MEBROFENIN IV KIT
9.6000 | PACK | Freq: Once | INTRAVENOUS | Status: AC | PRN
  Administered 2016-12-16: 9.6 via INTRAVENOUS

## 2016-12-16 MED ORDER — INDOMETHACIN 50 MG RE SUPP
RECTAL | Status: DC | PRN
  Administered 2016-12-16: 100 mg via RECTAL

## 2016-12-16 MED ORDER — MORPHINE SULFATE (PF) 2 MG/ML IV SOLN
4.4000 mg | Freq: Once | INTRAVENOUS | Status: AC
Start: 2016-12-16 — End: 2016-12-16
  Administered 2016-12-16: 4.4 mg via INTRAVENOUS
  Filled 2016-12-16: qty 3

## 2016-12-16 MED ORDER — SUCCINYLCHOLINE CHLORIDE 20 MG/ML IJ SOLN
INTRAMUSCULAR | Status: DC | PRN
  Administered 2016-12-16: 120 mg via INTRAVENOUS

## 2016-12-16 MED ORDER — INDOMETHACIN 50 MG RE SUPP
RECTAL | Status: AC
  Filled 2016-12-16: qty 2

## 2016-12-16 MED ORDER — PROPOFOL 10 MG/ML IV BOLUS
INTRAVENOUS | Status: DC | PRN
  Administered 2016-12-16: 200 mg via INTRAVENOUS

## 2016-12-16 MED ORDER — INDOMETHACIN 50 MG RE SUPP: 100.0000 mg | Freq: Once | RECTAL | Status: DC

## 2016-12-16 MED ORDER — MIDAZOLAM HCL 5 MG/5ML IJ SOLN
INTRAMUSCULAR | Status: DC | PRN
  Administered 2016-12-16: 2 mg via INTRAVENOUS

## 2016-12-16 MED ORDER — FENTANYL CITRATE (PF) 100 MCG/2ML IJ SOLN
INTRAMUSCULAR | Status: DC | PRN
  Administered 2016-12-16 (×2): 100 ug via INTRAVENOUS

## 2016-12-16 MED ORDER — MIDAZOLAM HCL 2 MG/2ML IJ SOLN
INTRAMUSCULAR | Status: AC
  Filled 2016-12-16: qty 2

## 2016-12-16 MED ORDER — SODIUM CHLORIDE 0.9 % IV SOLN
INTRAVENOUS | Status: DC | PRN
  Administered 2016-12-16: 30 mL

## 2016-12-16 MED ORDER — SODIUM CHLORIDE 0.9 % IV SOLN: INTRAVENOUS | Status: DC

## 2016-12-16 MED ORDER — PROPOFOL 10 MG/ML IV BOLUS
INTRAVENOUS | Status: AC
  Filled 2016-12-16: qty 20

## 2016-12-16 MED ORDER — GLUCAGON HCL RDNA (DIAGNOSTIC) 1 MG IJ SOLR
INTRAMUSCULAR | Status: AC
  Filled 2016-12-16: qty 1

## 2016-12-16 MED ORDER — TECHNETIUM TC 99M MEBROFENIN IV KIT: 5.0000 | PACK | Freq: Once | INTRAVENOUS | Status: DC | PRN

## 2016-12-16 MED ORDER — LACTATED RINGERS IV SOLN
INTRAVENOUS | Status: DC
  Administered 2016-12-16: 13:00:00 via INTRAVENOUS

## 2016-12-16 MED ORDER — FENTANYL CITRATE (PF) 100 MCG/2ML IJ SOLN
INTRAMUSCULAR | Status: AC
  Filled 2016-12-16: qty 2

## 2016-12-16 MED ORDER — HYDROMORPHONE HCL-NACL 0.5-0.9 MG/ML-% IV SOSY
1.0000 mg | PREFILLED_SYRINGE | Freq: Once | INTRAVENOUS | Status: AC
  Administered 2016-12-16: 1 mg via INTRAVENOUS
  Filled 2016-12-16: qty 2

## 2016-12-16 NOTE — Transfer of Care (Signed)
Immediate Anesthesia Transfer of Care Note  Patient: Bryan Fields  Procedure(s) Performed: Procedure(s): ENDOSCOPIC RETROGRADE CHOLANGIOPANCREATOGRAPHY (ERCP) (N/A)  Patient Location: PACU  Anesthesia Type:General  Level of Consciousness: awake, alert  and oriented  Airway & Oxygen Therapy: Patient Spontanous Breathing and Patient connected to face mask oxygen  Post-op Assessment: Report given to RN and Post -op Vital signs reviewed and stable  Post vital signs: Reviewed and stable  Last Vitals:  Vitals:   12/16/16 0528 12/16/16 1300  BP: 116/63 123/74  Pulse: 88 81  Resp: 16 12  Temp: 37.4 C 36.7 C    Last Pain:  Vitals:   12/16/16 1300  TempSrc: Oral  PainSc:       Patients Stated Pain Goal: 2 (09/09/57 1368)  Complications: No apparent anesthesia complications

## 2016-12-16 NOTE — H&P (View-Only) (Signed)
[facility] Progress Note  Subjective:  Feeling ok.  Minimal abdominal pain.  Eating small amounts.  Anxious to get better from this acute issue and go on vacation to the [place] on  7/21.  Objective:  Vital signs in last 24 hours: Temp:  [98.2 F (36.8 C)-100.3 F (37.9 C)] 99.1 F (37.3 C) (07/10 0529) Pulse Rate:  [77-80] 80 (07/10 0529) Resp:  [16-18] 18 (07/10 0529) BP: (113-120)/(67-76) 117/70 (07/10 0529) SpO2:  [97 %-98 %] 97 % (07/10 0529) Last BM Date: 12/14/16 General:  Alert, Well-developed, in NAD; jaundice noted. Heart:  Regular rate and rhythm; no murmurs Pulm:  CTAB.  No increased WOB. Abdomen:  Soft, non-distended.  BS present.  Minimal upper abdominal TTP.  Extremities:  Without edema. Neurologic:  Alert and oriented x 4;  grossly normal neurologically. Psych:  Alert and cooperative. Normal mood and affect.  Intake/Output from previous day: 07/09 0701 - 07/10 0700 In: 720 [P.O.:720] Out: -   Lab Results:  Recent Labs  12/13/16 0515 12/15/16 0825  WBC 2.3* 4.5  HGB 9.5* 10.3*  HCT 27.6* 29.4*  PLT 167 300   BMET  Recent Labs  12/13/16 0515 12/14/16 0524 12/15/16 0825  NA 135 134* 133*  K 3.5 3.9 3.7  CL 102 101 97*  CO2 26 26 26   GLUCOSE 111* 87 150*  BUN 7 6 6   CREATININE 0.65 0.60* 0.62  CALCIUM 8.5* 8.6* 8.8*   LFT  Recent Labs  12/15/16 0825  PROT 7.1  ALBUMIN 2.9*  AST 86*  ALT 82*  ALKPHOS 346*  BILITOT 13.3*   Ct Abdomen Pelvis W Contrast  Result Date: 12/14/2016 CLINICAL DATA:  Metastatic colon cancer. Sudden onset fever and chills with diarrhea 1 day ago. EXAM: CT ABDOMEN AND PELVIS WITH CONTRAST TECHNIQUE: Multidetector CT imaging of the abdomen and pelvis was performed using the standard protocol following bolus administration of intravenous contrast. CONTRAST:  83mL ISOVUE-300 IOPAMIDOL (ISOVUE-300) INJECTION 61%, 137mL ISOVUE-300 IOPAMIDOL (ISOVUE-300) INJECTION 61% COMPARISON:  Ultrasound exam  12/08/2016.  CT scan 10/02/2016 FINDINGS: Lower chest: Bilateral gynecomastia, incompletely visualized. Coronary artery calcification is noted. Hepatobiliary: Left hepatectomy. 2.9 x 3.7 cm mass along the resection margin of the liver is similar to prior. 16 mm lesion seen in the dome of the liver on prior study measures 2.2 cm today. There is a new 1.9 cm ill-defined low-density liver lesion seen in the right liver on image 17 series 2. A common duct stent is new in the interval and crosses a irregular and ill-defined soft tissue mass in the porta hepatis measuring 6.9 x 3.9 cm. The stent tracks from about the biliary confluence in the liver to the pancreas, but does not extend down the common bile duct into the duodenal lumen. There is mild intrahepatic biliary duct dilatation proximal to the stent, progressed in the interval since prior study. Gallbladder is distended with layering tiny gallstones. Gallbladder wall is ill-defined. There is a small amount of fluid in the hepatoduodenal ligament adjacent to the neck of the gallbladder and second portion of the duodenum. Pancreas: Diffusely atrophic. Spleen: No splenomegaly. No focal mass lesion. Adrenals/Urinary Tract: No adrenal nodule or mass. Kidneys are unremarkable. No evidence for hydroureter. The urinary bladder appears normal for the degree of distention. Stomach/Bowel: Stomach is decompressed. Duodenum is normally positioned as is the ligament of Treitz. No small bowel wall thickening. No small bowel dilatation. Right hemicolectomy. Vascular/Lymphatic: There is abdominal aortic atherosclerosis without aneurysm. Lymphadenopathy in  the porta hepatis is similar. No pelvic sidewall lymphadenopathy. Reproductive: The prostate gland and seminal vesicles have normal imaging features. Other: Left omental lesion measured at 3.0 cm previously is now 3.5 cm. 2.0 x 3.3 cm lesion in the right rectus sheath was 2.1 x 2.7 cm previously. There is trace fluid in the  inferior right liver which tracks down the right para colic gutter. Musculoskeletal: IMPRESSION: 1. Since the prior CT study, there has been a common duct stent placed with intrahepatic biliary duct dilatation on today's CT scan similar to a potentially minimally increased since the ultrasound exam 5 days ago. There is a distended gallbladder with small volume pericholecystic fluid and a small amount of fluid in the porta hepatis, down along the right liver and tracking in the right para colic gutter. Acute cholecystitis is a consideration. Given the biliary dilatation, cholangitis cannot be excluded by CT imaging. 2. Interval progression of pre-existing liver lesion within new metastatic lesion identified in the right liver today measuring 1.9 cm. 3. Persistent recurrent neoplasm along the resection margin of the liver and in the porta hepatis, not substantially changed in the interval since prior CT. Additional sites of metastatic disease are seen in the anterior abdominal wall and left omentum, also similar to prior. Electronically Signed   By: Misty Stanley M.D.   On: 12/14/2016 19:19   Assessment / Plan: 1. Elevated LFT's:  Increasing since time of admission, metal biliary stent placed 10/15/16-LFT's had initially trended down, now Bili at 13, concern for biliary stent obstruction on CT scan. 2. H/o Metastatic colon cancer to liver: s/p hemicolectomy.  Supposed to begin cycle 2 salvage chemotherapy soon. 3. Sepsis due to Klebsiella pneumoniae:  Has been treated with multiple antibiotics including vancomycin, Zosyn, Rocephin, metronidazole, and now Unasyn--started 7/7.  *Dr. Hilarie Fredrickson waiting to discuss with Dr. Carlean Purl regarding repeat ERCP with stent exchange.    LOS: 7 days   Bryan Fields D.  12/15/2016, 10:05 AM  Pager number [phone]

## 2016-12-16 NOTE — Interval H&P Note (Signed)
History and Physical Interval Note:  12/16/2016 1:35 PM  Bryan Fields  has presented today for surgery, with the diagnosis of malignant biliary obstruction  The various methods of treatment have been discussed with the patient and family. After consideration of risks, benefits and other options for treatment, the patient has consented to  Procedure(s): ENDOSCOPIC RETROGRADE CHOLANGIOPANCREATOGRAPHY (ERCP) (N/A) as a surgical intervention .  The patient's history has been reviewed, patient examined, no change in status, stable for surgery.  I have reviewed the patient's chart and labs.  Questions were answered to the patient's satisfaction.     Silvano Rusk

## 2016-12-16 NOTE — Progress Notes (Signed)
Patient ID: Bryan Fields, male   DOB: November 23, 1960, 56 y.o.   MRN: [id number]  PROGRESS NOTE    Bryan Fields  QPR:[id number] DOB: 07-Dec-1960 DOA: 12/08/2016  PCP: Ria Bush, MD   Brief Narrative:  56 year old male with history of metastatic colon cancer, scheduled to begin cycle 2 salvage chemotherapy with Lonsurf 12/14/16). Pt presented to ED with sudden onset fever, chills and diarrhea started 1 day prior to the admission. On admission, pt ANC was 1,400. CXR and UA were not suggestive of an acute infectious process. He was started on empiric vanco and zosyn while awaiting culture results and stool studies. Stool studies have not been collected as his diarrhea has stopped in hospital.  Hospital course is complicated with rising bilirubin and concern for acute cholecystitis for which reason GI consulted and plan is for HIDA scan.   Assessment & Plan:   Principal Problem: Febrile neutropenia (HCC) - Neutropenia resolved but still spiking low grade fevers, likely coming from biliary source  - Continue Unasyn   Active Problems: Sepsis due to Klebsiella pneumoniae  - Sepsis criteria met on admission with fever, tachypnea, neutropenia - Blood cultures grew klebsiella pneumoniae species - Stopped Rocephin and flagyl 7/7 - Continue Unasyn, started 7/7  Essential hypertension - Continue atenolol   Metastatic colon cancer to liver (HCC) / Abnormal LFT's - Stent in place - Pr GI, CT scan done 7/9; pt still with intrahepatic biliary ductal dilatation and also questionable gallbladder wall thickening and pericholecystic fluid? Acute cholecystitis - Plan for HIDA scan to evaluate for acute cholecystitis - May need percutaneous biliary drain as he is not surgical candidate - HIDA is negative, plan for ERCP  Pancytopenia (HCC) - Stable   Nausea vomiting and diarrhea - Stool for C.diff neg - Diarrhea resolved  - Pt feels better, appetite slowly improving    Hypokalemia - Due to GI losses - Supplemented    DVT prophylaxis: Lovenox subQ Code Status: full code  Family Communication: wife at bedside Disposition Plan: HIDA scan this am   Consultants:   Oncology  GI, Dr. Zenovia Jarred  Procedures:  Plan for HIDA this am  Antimicrobials:   Vanco and zosyn 7/2 - 7/4  Rocephin and flagyl 7/4 --> 7/7  Unasyn 7/7 -->   Subjective: More jaundiced this am.  Objective: Vitals:   12/15/16 0529 12/15/16 1439 12/15/16 2114 12/16/16 0528  BP: 117/70 123/75 122/75 116/63  Pulse: 80 67 78 88  Resp: '18 18 16 16  '$ Temp: 99.1 F (37.3 C) 98.5 F (36.9 C) 99.1 F (37.3 C) 99.3 F (37.4 C)  TempSrc: Oral Oral Oral Oral  SpO2: 97% 98% 100% 97%  Weight:      Height:        Intake/Output Summary (Last 24 hours) at 12/16/16 0954 Last data filed at 12/15/16 1200  Gross per 24 hour  Intake              120 ml  Output                0 ml  Net              120 ml   Filed Weights   12/08/16 1653 12/08/16 2158 12/09/16 0605  Weight: 105.2 kg (232 lb) 110.5 kg (243 lb 9.7 oz) 110.5 kg (243 lb 8 oz)    Physical Exam  Constitutional: Appears well-developed and well-nourished. No distress.  CVS: RRR, S1/S2 +, no murmurs, no gallops, no  carotid bruit.  Pulmonary: Effort and breath sounds normal, no stridor, rhonchi, wheezes, rales.  Abdominal: Soft. BS +,  no distension, tenderness, rebound or guarding.  Musculoskeletal: Normal range of motion. No edema and no tenderness.  Lymphadenopathy: No lymphadenopathy noted, cervical, inguinal. Neuro: Alert. Normal reflexes, muscle tone coordination. No cranial nerve deficit. Skin: Skin is warm and dry. Icteric Psychiatric: Normal mood and affect. Behavior, judgment, thought content normal.     Data Reviewed: I have personally reviewed following labs and imaging studies  CBC:  Recent Labs Lab 12/10/16 0357 12/11/16 0447 12/12/16 0605 12/13/16 0515 12/15/16 0825  WBC 1.9* 1.7*  2.0* 2.3* 4.5  HGB 10.0* 9.8* 10.0* 9.5* 10.3*  HCT 28.9* 28.0* 28.8* 27.6* 29.4*  MCV 92.3 91.2 90.9 91.1 90.7  PLT 117* 140* 155 167 562   Basic Metabolic Panel:  Recent Labs Lab 12/12/16 0605 12/13/16 0515 12/14/16 0524 12/15/16 0825 12/16/16 0500  NA 135 135 134* 133* 133*  K 3.4* 3.5 3.9 3.7 3.8  CL 101 102 101 97* 98*  CO2 '25 26 26 26 25  '$ GLUCOSE 98 111* 87 150* 96  BUN '7 7 6 6 7  '$ CREATININE 0.69 0.65 0.60* 0.62 0.64  CALCIUM 8.4* 8.5* 8.6* 8.8* 8.4*   GFR: Estimated Creatinine Clearance: 127.3 mL/min (by C-G formula based on SCr of 0.64 mg/dL). Liver Function Tests:  Recent Labs Lab 12/13/16 0515 12/14/16 0524 12/15/16 0825 12/16/16 0500  AST 58* 68* 86* 84*  ALT 75* 74* 82* 80*  ALKPHOS 236* 265* 346* 351*  BILITOT 7.7* 10.0* 13.3* 14.1*  PROT 6.1* 6.6 7.1 6.7  ALBUMIN 2.5* 2.7* 2.9* 2.7*   No results for input(s): LIPASE, AMYLASE in the last 168 hours. No results for input(s): AMMONIA in the last 168 hours. Coagulation Profile: No results for input(s): INR, PROTIME in the last 168 hours. Cardiac Enzymes: No results for input(s): CKTOTAL, CKMB, CKMBINDEX, TROPONINI in the last 168 hours. BNP (last 3 results) No results for input(s): PROBNP in the last 8760 hours. HbA1C: No results for input(s): HGBA1C in the last 72 hours. CBG:  Recent Labs Lab 12/10/16 0818  GLUCAP 85   Lipid Profile: No results for input(s): CHOL, HDL, LDLCALC, TRIG, CHOLHDL, LDLDIRECT in the last 72 hours. Thyroid Function Tests: No results for input(s): TSH, T4TOTAL, FREET4, T3FREE, THYROIDAB in the last 72 hours. Anemia Panel: No results for input(s): VITAMINB12, FOLATE, FERRITIN, TIBC, IRON, RETICCTPCT in the last 72 hours. Urine analysis:    Component Value Date/Time   COLORURINE AMBER (A) 12/08/2016 1708   APPEARANCEUR HAZY (A) 12/08/2016 1708   LABSPEC 1.035 (H) 12/08/2016 1708   LABSPEC 1.020 03/18/2015 0847   PHURINE 5.0 12/08/2016 1708   GLUCOSEU NEGATIVE  12/08/2016 1708   GLUCOSEU Negative 03/18/2015 0847   HGBUR NEGATIVE 12/08/2016 1708   HGBUR small 04/01/2010 0848   BILIRUBINUR MODERATE (A) 12/08/2016 1708   BILIRUBINUR Negative 03/18/2015 0847   KETONESUR NEGATIVE 12/08/2016 1708   PROTEINUR 100 (A) 12/08/2016 1708   UROBILINOGEN 0.2 03/18/2015 0847   NITRITE NEGATIVE 12/08/2016 1708   LEUKOCYTESUR NEGATIVE 12/08/2016 1708   LEUKOCYTESUR Negative 03/18/2015 0847   Sepsis Labs: '@LABRCNTIP'$ (procalcitonin:4,lacticidven:4)    Recent Results (from the past 240 hour(s))  Blood Culture (routine x 2)     Status: None   Collection Time: 12/08/16  5:08 PM  Result Value Ref Range Status   Specimen Description BLOOD RIGHT ANTECUBITAL  Final   Special Requests   Final    BOTTLES DRAWN AEROBIC  AND ANAEROBIC Blood Culture adequate volume   Culture   Final    NO GROWTH 5 DAYS Performed at [facility], [place] [address] [place], [place] [address]    Report Status 12/13/2016 FINAL  Final  Urine culture     Status: Abnormal   Collection Time: 12/08/16  5:08 PM  Result Value Ref Range Status   Specimen Description URINE, RANDOM  Final   Special Requests Immunocompromised  Final   Culture MULTIPLE SPECIES PRESENT, SUGGEST RECOLLECTION (A)  Final   Report Status 12/10/2016 FINAL  Final  Blood Culture (routine x 2)     Status: Abnormal   Collection Time: 12/08/16  7:02 PM  Result Value Ref Range Status   Specimen Description LEFT ANTECUBITAL  Final   Special Requests IN PEDIATRIC BOTTLE Blood Culture adequate volume  Final   Culture  Setup Time   Final    GRAM NEGATIVE RODS IN PEDIATRIC BOTTLE CRITICAL RESULT CALLED TO, READ BACK BY AND VERIFIED WITH: Christean Grief 10:30 12/09/16 ([email or web address]) Performed at [facility], [place] [address], [place], [place] [address]    Culture KLEBSIELLA PNEUMONIAE (A)  Final   Report Status 12/11/2016 FINAL  Final   Organism ID, Bacteria KLEBSIELLA PNEUMONIAE  Final      Susceptibility   Klebsiella  pneumoniae - MIC*    AMPICILLIN RESISTANT Resistant     CEFAZOLIN <=4 SENSITIVE Sensitive     CEFEPIME <=1 SENSITIVE Sensitive     CEFTAZIDIME <=1 SENSITIVE Sensitive     CEFTRIAXONE <=1 SENSITIVE Sensitive     CIPROFLOXACIN <=0.25 SENSITIVE Sensitive     GENTAMICIN <=1 SENSITIVE Sensitive     IMIPENEM <=0.25 SENSITIVE Sensitive     TRIMETH/SULFA <=20 SENSITIVE Sensitive     AMPICILLIN/SULBACTAM 4 SENSITIVE Sensitive     PIP/TAZO <=4 SENSITIVE Sensitive     Extended ESBL NEGATIVE Sensitive     * KLEBSIELLA PNEUMONIAE  Blood Culture ID Panel (Reflexed)     Status: Abnormal   Collection Time: 12/08/16  7:02 PM  Result Value Ref Range Status   Enterococcus species NOT DETECTED NOT DETECTED Final   Listeria monocytogenes NOT DETECTED NOT DETECTED Final   Staphylococcus species NOT DETECTED NOT DETECTED Final   Staphylococcus aureus NOT DETECTED NOT DETECTED Final   Streptococcus species NOT DETECTED NOT DETECTED Final   Streptococcus agalactiae NOT DETECTED NOT DETECTED Final   Streptococcus pneumoniae NOT DETECTED NOT DETECTED Final   Streptococcus pyogenes NOT DETECTED NOT DETECTED Final   Acinetobacter baumannii NOT DETECTED NOT DETECTED Final   Enterobacteriaceae species DETECTED (A) NOT DETECTED Final    Comment: Enterobacteriaceae represent a large family of gram-negative bacteria, not a single organism. CRITICAL RESULT CALLED TO, READ BACK BY AND VERIFIED WITH: 10:30 12/09/16 ([email or web address])    Enterobacter cloacae complex NOT DETECTED NOT DETECTED Final   Escherichia coli NOT DETECTED NOT DETECTED Final   Klebsiella oxytoca NOT DETECTED NOT DETECTED Final   Klebsiella pneumoniae DETECTED (A) NOT DETECTED Final    Comment: CRITICAL RESULT CALLED TO, READ BACK BY AND VERIFIED WITH: Mila Merry.D. 10:30 12/09/16 ([email or web address])    Proteus species NOT DETECTED NOT DETECTED Final   Serratia marcescens NOT DETECTED NOT DETECTED Final   Carbapenem resistance NOT DETECTED NOT DETECTED Final    Haemophilus influenzae NOT DETECTED NOT DETECTED Final   Neisseria meningitidis NOT DETECTED NOT DETECTED Final   Pseudomonas aeruginosa NOT DETECTED NOT DETECTED Final   Candida albicans NOT DETECTED NOT DETECTED Final  Candida glabrata NOT DETECTED NOT DETECTED Final   Candida krusei NOT DETECTED NOT DETECTED Final   Candida parapsilosis NOT DETECTED NOT DETECTED Final   Candida tropicalis NOT DETECTED NOT DETECTED Final    Comment: Performed at [facility], [place] [address], [employer], [place] [address]  Culture, blood (routine x 2)     Status: None   Collection Time: 12/10/16  5:39 AM  Result Value Ref Range Status   Specimen Description BLOOD RIGHT ARM  Final   Special Requests   Final    BOTTLES DRAWN AEROBIC AND ANAEROBIC Blood Culture adequate volume   Culture   Final    NO GROWTH 5 DAYS Performed at [facility], [address] [place], [place] [address]    Report Status 12/15/2016 FINAL  Final  Culture, blood (routine x 2)     Status: None   Collection Time: 12/10/16  5:46 AM  Result Value Ref Range Status   Specimen Description BLOOD LEFT ARM  Final   Special Requests   Final    BOTTLES DRAWN AEROBIC AND ANAEROBIC Blood Culture adequate volume   Culture   Final    NO GROWTH 5 DAYS Performed at [facility], [place] [address], [place], [place] [address]    Report Status 12/15/2016 FINAL  Final  C difficile quick scan w PCR reflex     Status: None   Collection Time: 12/10/16  6:53 PM  Result Value Ref Range Status   C Diff antigen NEGATIVE NEGATIVE Final   C Diff toxin NEGATIVE NEGATIVE Final   C Diff interpretation No C. difficile detected.  Final      Radiology Studies: Ct Abdomen Pelvis W Contrast  Result Date: 12/14/2016 CLINICAL DATA:  Metastatic colon cancer. Sudden onset fever and chills with diarrhea 1 day ago. EXAM: CT ABDOMEN AND PELVIS WITH CONTRAST TECHNIQUE: Multidetector CT imaging of the abdomen and pelvis was performed using the  standard protocol following bolus administration of intravenous contrast. CONTRAST:  90m ISOVUE-300 IOPAMIDOL (ISOVUE-300) INJECTION 61%, 1069mISOVUE-300 IOPAMIDOL (ISOVUE-300) INJECTION 61% COMPARISON:  Ultrasound exam 12/08/2016.  CT scan 10/02/2016 FINDINGS: Lower chest: Bilateral gynecomastia, incompletely visualized. Coronary artery calcification is noted. Hepatobiliary: Left hepatectomy. 2.9 x 3.7 cm mass along the resection margin of the liver is similar to prior. 16 mm lesion seen in the dome of the liver on prior study measures 2.2 cm today. There is a new 1.9 cm ill-defined low-density liver lesion seen in the right liver on image 17 series 2. A common duct stent is new in the interval and crosses a irregular and ill-defined soft tissue mass in the porta hepatis measuring 6.9 x 3.9 cm. The stent tracks from about the biliary confluence in the liver to the pancreas, but does not extend down the common bile duct into the duodenal lumen. There is mild intrahepatic biliary duct dilatation proximal to the stent, progressed in the interval since prior study. Gallbladder is distended with layering tiny gallstones. Gallbladder wall is ill-defined. There is a small amount of fluid in the hepatoduodenal ligament adjacent to the neck of the gallbladder and second portion of the duodenum. Pancreas: Diffusely atrophic. Spleen: No splenomegaly. No focal mass lesion. Adrenals/Urinary Tract: No adrenal nodule or mass. Kidneys are unremarkable. No evidence for hydroureter. The urinary bladder appears normal for the degree of distention. Stomach/Bowel: Stomach is decompressed. Duodenum is normally positioned as is the ligament of Treitz. No small bowel wall thickening. No small bowel dilatation.  Right hemicolectomy. Vascular/Lymphatic: There is abdominal aortic atherosclerosis without aneurysm. Lymphadenopathy in the porta hepatis is similar. No pelvic sidewall lymphadenopathy. Reproductive: The prostate gland and seminal  vesicles have normal imaging features. Other: Left omental lesion measured at 3.0 cm previously is now 3.5 cm. 2.0 x 3.3 cm lesion in the right rectus sheath was 2.1 x 2.7 cm previously. There is trace fluid in the inferior right liver which tracks down the right para colic gutter. Musculoskeletal: IMPRESSION: 1. Since the prior CT study, there has been a common duct stent placed with intrahepatic biliary duct dilatation on today's CT scan similar to a potentially minimally increased since the ultrasound exam 5 days ago. There is a distended gallbladder with small volume pericholecystic fluid and a small amount of fluid in the porta hepatis, down along the right liver and tracking in the right para colic gutter. Acute cholecystitis is a consideration. Given the biliary dilatation, cholangitis cannot be excluded by CT imaging. 2. Interval progression of pre-existing liver lesion within new metastatic lesion identified in the right liver today measuring 1.9 cm. 3. Persistent recurrent neoplasm along the resection margin of the liver and in the porta hepatis, not substantially changed in the interval since prior CT. Additional sites of metastatic disease are seen in the anterior abdominal wall and left omentum, also similar to prior. Electronically Signed   By: Misty Stanley M.D.   On: 12/14/2016 19:19        Scheduled Meds: . acyclovir ointment   Topical Q3H  . atenolol  50 mg Oral Daily  . enoxaparin (LOVENOX) injection  40 mg Subcutaneous Q24H  . famotidine  20 mg Oral Daily  .  HYDROmorphone (DILAUDID) injection  1 mg Intravenous Once  . lactose free nutrition  237 mL Oral TID WC  . metoCLOPramide  10 mg Oral TID AC  .  morphine injection  4.4 mg Intravenous Once  . pantoprazole  40 mg Oral Daily  . polyethylene glycol  17 g Oral BID  . potassium chloride SA  40 mEq Oral Daily  . senna-docusate  1 tablet Oral BID  . vitamin B-12  2,500 mcg Oral Daily   Continuous Infusions: .  ampicillin-sulbactam (UNASYN) IV Stopped (12/16/16 0315)     LOS: 8 days    Time spent: 15 minutes Greater than 50% of the time spent on counseling and coordinating the care.   Leisa Lenz, MD [facility] Pager [phone]  If 7PM-7AM, please contact night-coverage [email or web address] Password [facility] 12/16/2016, 9:54 AM

## 2016-12-16 NOTE — Anesthesia Preprocedure Evaluation (Addendum)
Anesthesia Evaluation  Patient identified by MRN, date of birth, ID band Patient awake    Reviewed: Allergy & Precautions, NPO status , Patient's Chart, lab work & pertinent test results, reviewed documented beta blocker date and time   Airway Mallampati: II  TM Distance: >3 FB Neck ROM: Full    Dental no notable dental hx. (+) Edentulous Upper, Edentulous Lower   Pulmonary neg pulmonary ROS, former smoker,    Pulmonary exam normal breath sounds clear to auscultation       Cardiovascular hypertension, Pt. on medications and Pt. on home beta blockers Normal cardiovascular exam Rhythm:Regular Rate:Normal     Neuro/Psych  Neuromuscular disease negative neurological ROS  negative psych ROS   GI/Hepatic negative GI ROS, Neg liver ROS, GERD  ,  Endo/Other  negative endocrine ROS  Renal/GU negative Renal ROS  negative genitourinary   Musculoskeletal negative musculoskeletal ROS (+)   Abdominal   Peds negative pediatric ROS (+)  Hematology negative hematology ROS (+)   Anesthesia Other Findings Metastatic Colon Cancer: Jaundiced  Reproductive/Obstetrics negative OB ROS                            Anesthesia Physical  Anesthesia Plan  ASA: IV  Anesthesia Plan: General   Post-op Pain Management:    Induction: Intravenous  PONV Risk Score and Plan: 2 and Ondansetron, Propofol and Treatment may vary due to age or medical condition  Airway Management Planned: Oral ETT  Additional Equipment:   Intra-op Plan:   Post-operative Plan: Extubation in OR  Informed Consent: I have reviewed the patients History and Physical, chart, labs and discussed the procedure including the risks, benefits and alternatives for the proposed anesthesia with the patient or authorized representative who has indicated his/her understanding and acceptance.   Dental advisory given  Plan Discussed with: CRNA and  Surgeon  Anesthesia Plan Comments:         Anesthesia Quick Evaluation

## 2016-12-16 NOTE — Care Management Note (Signed)
Case Management Note  Patient Details  Name: Bryan Fields MRN: [id number] Date of Birth: 1960-09-05  Subjective/Objective:           56 yo admitted with Febrile Neutropenia. Hx of colon cancer with  mets to the liver         Action/Plan: From home with spouse and independent with ADLs prior to admission.   Expected Discharge Date:   (unknown)               Expected Discharge Plan:  Home/Self Care  In-House Referral:     Discharge planning Services  CM Consult  Post Acute Care Choice:    Choice offered to:     DME Arranged:    DME Agency:     HH Arranged:    HH Agency:     Status of Service:  In process, will continue to follow  If discussed at Long Length of Stay Meetings, dates discussed:    Additional CommentsLynnell Catalan, RN 12/16/2016, 10:32 AM  [phone]

## 2016-12-16 NOTE — Op Note (Signed)
[facility] Patient Name: Bryan Fields Procedure Date: 12/16/2016 MRN: [id number] Attending MD: Gatha Mayer , MD Date of Birth: 22-Feb-1961 CSN: [id number] Age: 56 Admit Type: Inpatient Procedure:                ERCP Indications:              Jaundice, Liver metastases with obstruction Providers:                Gatha Mayer, MD, Cleda Daub, RN, Corliss Parish, Technician Referring MD:              Medicines:                General Anesthesia, Unasyn 1.5 g IV Complications:            No immediate complications. Estimated Blood Loss:     Estimated blood loss: none. Procedure:                Pre-Anesthesia Assessment:                           - Prior to the procedure, a History and Physical                            was performed, and patient medications and                            allergies were reviewed. The patient's tolerance of                            previous anesthesia was also reviewed. The risks                            and benefits of the procedure and the sedation                            options and risks were discussed with the patient.                            All questions were answered, and informed consent                            was obtained. Prior Anticoagulants: The patient                            last took Lovenox (enoxaparin) 1 day prior to the                            procedure. ASA Grade Assessment: III - A patient                            with severe systemic disease. After reviewing the  risks and benefits, the patient was deemed in                            satisfactory condition to undergo the procedure.                           After obtaining informed consent, the scope was                            passed under direct vision. Throughout the                            procedure, the patient's blood pressure, pulse, and   oxygen saturations were monitored continuously. The                            [id number] ([id number]) scope was introduced through                            the mouth, and used to inject contrast into and                            used to inject contrast into the bile duct. The                            ERCP was accomplished without difficulty. The                            patient tolerated the procedure well. Scope In: Scope Out: Findings:      A biliary stent was visible on the scout film. Prior sphincterotomy       evident at papilla. Metal stent in bile duct above papilla on scout.       Staples in area of left hepatectomy. Sphincterotome advanced over wire       and contrast injected. Stent did not fill well. There was a trifurcation       of main intrahepatics that filled some but not much proximal. Eventually       did fill out right intrahepatics but not mid or left. Gallbladder filled       well. Balloon used - unable to get proximal or beyon mid duct/stent. Mid       bile duct stented area did not fill much. 10 Fr 12 cm stent then placed       through metal stent and into the right main intrahepatic and there was       good drainage of contrast and some debris. No bile draining at all. 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 Right                            interahepatics, mid and right most. [Stricture Tx].                           -  A previously placed biliary stent was partially                            occluded. This was treated by inserting a new stent                            inside the old stent.                           - Looks like possible mets causing obstruction of                            some intrahepatics - 2 of 3. Stent seemed occluded                            in mid-portion. Successful contrast drainage with                            stent. will have to  see how much this does. If                            further treatment indicated would probably do an                            MRCP first and then IR approach but could be that                            tumor burden is becomin an insurmountable problem.                           Gallbladder is fine. Moderate Sedation:      N/A- Per Anesthesia Care Recommendation:           - Return patient to hospital ward for ongoing care.                           - See what LFTs do                           If additional Tx considered likely do an MR/MRCP                            and consider IR approach Procedure Code(s):        --- Professional ---                           [phone], Endoscopic retrograde                            cholangiopancreatography (ERCP); with placement of                            endoscopic stent into biliary or pancreatic duct,  including pre- and post-dilation and guide wire                            passage, when performed, including sphincterotomy,                            when performed, each stent Diagnosis Code(s):        --- Professional ---                           T85.590A, Other mechanical complication of bile                            duct prosthesis, initial encounter                           R17, Unspecified jaundice                           C78.7, Secondary malignant neoplasm of liver and                            intrahepatic bile duct                           K83.1, Obstruction of bile duct CPT copyright 2016 [employer]. All rights reserved. The codes documented in this report are preliminary and upon coder review may  be revised to meet current compliance requirements. Gatha Mayer, MD 12/16/2016 2:48:28 PM This report has been signed electronically. Number of Addenda: 0

## 2016-12-16 NOTE — Progress Notes (Signed)
EGD scheduled this afternoon, pt to remain NPO.

## 2016-12-16 NOTE — Anesthesia Postprocedure Evaluation (Signed)
Anesthesia Post Note  Patient: BESNIK FEBUS  Procedure(s) Performed: Procedure(s) (LRB): ENDOSCOPIC RETROGRADE CHOLANGIOPANCREATOGRAPHY (ERCP) (N/A)     Patient location during evaluation: PACU Anesthesia Type: General Level of consciousness: awake and alert Pain management: pain level controlled Vital Signs Assessment: post-procedure vital signs reviewed and stable Respiratory status: spontaneous breathing, nonlabored ventilation and respiratory function stable Cardiovascular status: blood pressure returned to baseline and stable Postop Assessment: no signs of nausea or vomiting Anesthetic complications: no    Last Vitals:  Vitals:   12/16/16 1520 12/16/16 1541  BP: (!) 144/76 125/77  Pulse: 81 82  Resp: 14 18  Temp:  36.7 C    Last Pain:  Vitals:   12/16/16 1541  TempSrc: Oral  PainSc: 0-No pain                 Lynda Rainwater

## 2016-12-16 NOTE — Anesthesia Procedure Notes (Signed)
Procedure Name: Intubation Performed by: Valmore Arabie J Pre-anesthesia Checklist: Patient identified, Emergency Drugs available, Suction available, Patient being monitored and Timeout performed Patient Re-evaluated:Patient Re-evaluated prior to induction Oxygen Delivery Method: Circle system utilized Preoxygenation: Pre-oxygenation with 100% oxygen Induction Type: IV induction Ventilation: Mask ventilation without difficulty Laryngoscope Size: Mac and 4 Grade View: Grade I Tube type: Oral Tube size: 7.5 mm Number of attempts: 1 Airway Equipment and Method: Stylet Placement Confirmation: ETT inserted through vocal cords under direct vision,  positive ETCO2,  CO2 detector and breath sounds checked- equal and bilateral Secured at: 23 cm Tube secured with: Tape Dental Injury: Teeth and Oropharynx as per pre-operative assessment        

## 2016-12-17 DIAGNOSIS — R7881 Bacteremia: Secondary | ICD-10-CM

## 2016-12-17 DIAGNOSIS — D701 Agranulocytosis secondary to cancer chemotherapy: Secondary | ICD-10-CM

## 2016-12-17 DIAGNOSIS — A4189 Other specified sepsis: Secondary | ICD-10-CM

## 2016-12-17 DIAGNOSIS — D6959 Other secondary thrombocytopenia: Secondary | ICD-10-CM

## 2016-12-17 DIAGNOSIS — D6481 Anemia due to antineoplastic chemotherapy: Secondary | ICD-10-CM

## 2016-12-17 LAB — COMPREHENSIVE METABOLIC PANEL
ALBUMIN: 2.5 g/dL — AB (ref 3.5–5.0)
ALT: 66 U/L — AB (ref 17–63)
AST: 59 U/L — AB (ref 15–41)
Alkaline Phosphatase: 322 U/L — ABNORMAL HIGH (ref 38–126)
Anion gap: 8 (ref 5–15)
BUN: 8 mg/dL (ref 6–20)
CHLORIDE: 101 mmol/L (ref 101–111)
CO2: 26 mmol/L (ref 22–32)
Calcium: 8.2 mg/dL — ABNORMAL LOW (ref 8.9–10.3)
Creatinine, Ser: 0.75 mg/dL (ref 0.61–1.24)
GFR calc Af Amer: >60 mL/min (ref >60)
Glucose, Bld: 136 mg/dL — ABNORMAL HIGH (ref 65–99)
POTASSIUM: 3.8 mmol/L (ref 3.5–5.1)
SODIUM: 135 mmol/L (ref 135–145)
Total Bilirubin: 10.4 mg/dL — ABNORMAL HIGH (ref 0.3–1.2)
Total Protein: 6.5 g/dL (ref 6.5–8.1)

## 2016-12-17 NOTE — Progress Notes (Addendum)
IP PROGRESS NOTE  Subjective:   Bryan Fields reports improvement in abdominal pain and nausea. His wife feels he is less jaundiced today. He was taken to an ERCP yesterday by Dr. Carlean Purl. A localized stricture was noted in the right biliary system. The previously placed biliary stent was partially occluded. A new stent was inserted through the old one.   Objective: Vital signs in last 24 hours: Blood pressure 110/69, pulse 84, temperature 98.7 F (37.1 C), temperature source Oral, resp. rate 13, height 5' 11" (1.803 m), weight 232 lb (105.2 kg), SpO2 98 %.  Intake/Output from previous day: 07/11 0701 - 07/12 0700 In: 7741 [P.O.:720; I.V.:700] Out: -   Physical Exam:  HEENT: No thrush, scleral icterus Lungs: Clear bilaterally Cardiac: Regular rate and rhythm Abdomen: Mild diffuse tenderness, fullness in the right upper abdomen, small opening at the upper transverse wound without evidence of infection Extremities: No leg edema Skin: Jaundice   Portacath/PICC-without erythema  Lab Results:  Recent Labs  12/15/16 0825  WBC 4.5  HGB 10.3*  HCT 29.4*  PLT 300    BMET  Recent Labs  12/16/16 0500 12/17/16 0502  NA 133* 135  K 3.8 3.8  CL 98* 101  CO2 25 26  GLUCOSE 96 136*  BUN 7 8  CREATININE 0.64 0.75  CALCIUM 8.4* 8.2*    Studies/Results: Nm Hepatobiliary Including Gb  Result Date: 12/16/2016 CLINICAL DATA:  Abdominal pain with nausea vomiting. Common bile duct stent with metastatic disease in the region of the porta hepatis. Biliary dilatation. EXAM: NUCLEAR MEDICINE HEPATOBILIARY IMAGING TECHNIQUE: Sequential images of the abdomen were obtained out to 60 minutes following intravenous administration of radiopharmaceutical. RADIOPHARMACEUTICALS:  9.6 mCi Tc-40m Choletec IV COMPARISON:  CT scan 12/14/2016 FINDINGS: Delayed radiotracer uptake by liver parenchyma and delayed clearance of blood pool activity is compatible with underlying hepatocyte dysfunction. The  was given IV morphine to facilitate gallbladder filling. After 2 hours of total observation, there is no visualization of bile ducts, gallbladder, or small bowel. IMPRESSION: Poor hepatic uptake of radiotracer with delayed blood pool clearance is associated with nonvisualization the bile ducts. Imaging features are consistent with biliary obstruction and hepatocyte dysfunction. Electronically Signed   By: EMisty StanleyM.D.   On: 12/16/2016 11:52   Dg Ercp  Result Date: 12/16/2016 CLINICAL DATA:  Adenocarcinoma EXAM: ERCP TECHNIQUE: Multiple spot images obtained with the fluoroscopic device and submitted for interpretation post-procedure. FLUOROSCOPY TIME:  Fluoroscopy Time:  5 minutes and 25 seconds Radiation Exposure Index (if provided by the fluoroscopic device): 124.65 Number of Acquired Spot Images: 0 COMPARISON:  None. FINDINGS: There is a stent in the mid and upper common bile duct. Cannulation of the common bile duct is documented. Contrast fills the biliary tree. There is dilatation of the intrahepatic biliary tree. There is filling of the stented portion of the common bile duct preventing opacification of contrast. Final images demonstrate placement of a plastic stent extending from above the stent into the duodenum. IMPRESSION: Plastic stent placement across the common bile duct as described. These images were submitted for radiologic interpretation only. Please see the procedural report for the amount of contrast and the fluoroscopy time utilized. Electronically Signed   By: AMarybelle KillingsM.D.   On: 12/16/2016 14:53    Medications: I have reviewed the patient's current medications.  Assessment/Plan: 1. 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 Positive for a mutation at codon 13 of the KRAS  gene. Microsatellite stable; preserved expression of major and minor MMR proteins. He began treatment with FOLFOX in December 2011. He completed cycle  #12 on 10/27/2010. Oxaliplatin was held with cycles number 7 through 10 and resumed with cycle #11. 1. History of neutropenia secondary to chemotherapy. 2. History of thrombocytopenia secondary chemotherapy. 3. He did not undergo a preoperative colonoscopy. He underwent a colonoscopy on 12/19/2010 with findings of a 1 cm polyp at 90 cm from the anal verge, minimal polyp at 30 cm from the anal verge and minimal diverticulosis. Colonoscopy 03/18/2012-negative. 4. 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 He began study drug on 05/13/2010. 5. Status post Port-A-Cath placement. The Port-A-Cath has been removed. 6. 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 8. Low attenuation lesion in the caudate lobe of the liver on the CT 04/11/2012-? Cyst. MRI liver on 04/20/2012 favored the caudate lobe liver lesion to represent a cyst or complex cyst. CT abdomen/pelvis 04/17/2013 with continued enlargement of hypovascular lesion in the caudate lobe of the liver.  PET scan 10/27/2011 confirmed a hypermetabolic caudate lobe liver lesion, tiny indeterminate lung nodules, and a 9 mm level II left neck node   CT biopsy of the liver lesion 11/07/2013 confirmed adenocarcinoma  Left liver and caudate resection 01/15/2014-pathology confirmed metastatic colon cancer, and negative surgical margins  Surveillance CT scans 07/24/2014 consistent with recurrent disease involving upper abdominal lymph nodes, peritoneal implants, and an anterior abdominal wall mass  Status post biopsy of the anterior abdominal wall mass 08/06/2014 with pathology showing metastatic adenocarcinoma consistent with a colon primary.  UGT1A1 1/28  Cycle 1 FOLFIRI/Avastin on the [facility] 08/20/2014  Cycle 2 FOLFIRI/Avastin 09/03/2014  Cycle 3 held on 09/17/2014 due to  neutropenia  Cycle 3 FOLFIRI/Avastin 09/25/2014. Neulasta added beginning with cycle 3.  Cycle 4 FOLFIRI/Avastin 10/08/2014  Restaging CT scans 10/18/2014 with slight interval increase in the size of the soft tissue mass in the subcutaneous fat of the epigastric region. Underlying peritoneal implants, probable focus of residual disease along the resected margin of the liver and hepatoduodenal ligament lymphadenopathy all appeared slightly improved. No new sites of metastatic disease otherwise noted. New left lower lobe bronchopneumonia.  Cycle 5 FOLFIRI/Avastin 10/22/2014  Cycle 6 FOLFIRI/Avastin 11/06/2014  Cycle 7 FOLFIRI Avastin 11/19/2014  Cycle 8 FOLFIRI/Avastin 12/03/2014  CT 12/14/2014 with stable disease  Cycle 9 FOLFIRI/Avastin 12/17/2014  Cycle 10 FOLFIRI/Avastin 01/07/2015 (5-fluorouracil and leucovorin dose reduced)  Cycle 11 FOLFIRI/Avastin 01/21/2015  Cycle 12 FOLFIRI/Avastin 02/04/2015  CT 02/13/2015 with stable disease  Cycle 13 FOLFIRI/Avastin 02/25/2015-changed to an every three-week protocol, now off study  Avastin held with cycle 14 FOLFIRI on 03/25/2015 secondary to gingivitis  Restaging CTs 05/20/2015-slight increase in the size of the epigastric subcutaneous mass, stable peritoneal implants  FOLFIRI continued on a 3 week schedule  Avastin resumed 06/17/2015  CTs 12/23/2015-stable subxiphoid mass, stable low-attenuation mass adjacent to the left liver surgical margin, peritoneal lesions-stable  FOLFIRI continued on a 4 week schedule.  Avastin placed on hold 01/06/2016  FOLFIRI discontinued 03/02/2016 secondary to enlargement of the abdominal wall mass with new superficial fungating nodules involving the skin  Palliative radiation to the abdominal wall mass completed 03/20/2016  Cycle 1 FOLFOX12/27/2017  Cycle 2 CAPOX 06/22/2016  Clinical progression 07/07/2016 and allergic reaction to oxaliplatin 06/23/2015, CAPOX discontinued  Resection of  the main abdominal wall mass and an adjacent peritoneal mass on 07/23/2016  with the pathology confirming metastatic colon cancer  [facility] at [facility] 09/18/2016-new liver metastasis, progressive soft tissue masses at the liver resection margin, new mass adjacent to the chest wall resection site, increase in size of peritoneal nodules  CT abdomen/pelvis 10/02/2016-interval progression of periportal adenopathy and recurrence along the hepatic resection margin. New hepatic program lesion superior right hepatic lobe. Interval increase in size of peritoneal implants left upper quadrant. Enlargement of anabdominal wall mass along the right rectus muscle.  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 This stricture was stented with a 6cm long, uncovered 58m diameter metal bile duct stent in good position.   Initiation of Lonsurf cycle 1 11/17/2016 10. Iron deposition within the liver noted on MRI 04/20/2012. The ferritin level was normal on 04/17/2013. Increased iron deposition noted on the left liver resection 01/15/2014 11. Pain/bleeding at the anus reported when here 04/24/2013. Status post evaluation by Dr. NLucia Gaskinswith findings of an anal fissure. 12. Right face cystic lesion 13. Status post Port-A-Cath placement 08/14/2014 14. Delayed nausea following FOLFIRI, prophylactic Decadron added with cycle 2 with improvement. 15. Chest CT 10/18/2014 with new left lower lobe bronchopneumonia. Levaquin initiated. Resolved on CT 12/14/2014 16. Gum pain-mucositis?, Gingivitis?-Improved with discontinuation of Avastin 17. Pain/tenderness, mild erythema and full appearance over the maxillary sinuses. Maxillofacial CT 01/30/2015 with mild to moderate bilateral maxillary and sphenoid sinus inflammatory changes. Multifocal right maxillary dental periapical lucency. Status post a right upper tooth extraction with resolution of the  pain. 18. Admission 10/21/2016 with nausea/vomiting-x-ray evidence for a partial small bowel obstruction, clinical improvement with bowel rest, recurrent obstructive symptoms during the week of 11/30/2016                                                                                        19. Admission 12/08/2016 with a high fever-Klebsiella bacteremia  confirmed, likely a biliary source               20. Mild neutropenia and anemia/thrombocytopenia secondary to chemotherapy-the platelet count and white count have recovered during this hospital admission  21.  Biliary obstruction-CT 12/14/2016 revealed intrahepatic biliary dilatation despite a common bile duct stent distended gallbladder  Nuclear medicine biliary scan 12/16/2016-poor hepatic uptake with nonvisualization of the bile ducts consistent with biliary obstruction  ERCP 12/16/2016 confirmed intrahepatic biliary dilatation, status post placement of a new bile duct stent  Bryan Fields admitted with biliary sepsis. His clinical status has improved with antibiotics. He underwent placement of a new bile duct stent yesterday. The bilirubin is lower today.  He will not be a candidate for additional systemic therapy until the bilirubin normalizes. He may require an IR procedure in an attempt to decompress the biliary system.  Recommendations: 1. Complete course of antibiotics for the Klebsiella bacteremia 2. Management of the biliary obstruction per GI and interventional radiology 3. Please call Oncology as needed. I will be out of town starting 12/18/2016. [place]        LOS: 9 days   GDonneta Romberg MD   12/17/2016,  1:39 PM

## 2016-12-17 NOTE — Progress Notes (Signed)
[facility] Progress Note  Subjective:  Feels good.  Anxious to get out of the hospital.  Objective:  Vital signs in last 24 hours: Temp:  [97.8 F (36.6 C)-98.7 F (37.1 C)] 98.7 F (37.1 C) (07/12 0541) Pulse Rate:  [81-95] 84 (07/12 0541) Resp:  [12-18] 13 (07/12 0541) BP: (110-144)/(65-86) 110/69 (07/12 0541) SpO2:  [94 %-100 %] 98 % (07/12 0541) Weight:  [232 lb (105.2 kg)] 232 lb (105.2 kg) (07/11 1300) Last BM Date: 12/16/16 General:  Alert, Well-developed, in NAD; jaundice noted Heart:  Regular rate and rhythm; no murmurs Pulm:  CTAB.  No increased WOB. Abdomen:  Soft, non-distended.  BS present.  Minimal TTP.   Extremities:  Without edema. Neurologic:  Alert and oriented x 4;  grossly normal neurologically. Psych:  Alert and cooperative. Normal mood and affect.  Intake/Output from previous day: 07/11 0701 - 07/12 0700 In: 5003 [P.O.:720; I.V.:700] Out: -    Lab Results:  Recent Labs  12/15/16 0825  WBC 4.5  HGB 10.3*  HCT 29.4*  PLT 300   BMET  Recent Labs  12/15/16 0825 12/16/16 0500 12/17/16 0502  NA 133* 133* 135  K 3.7 3.8 3.8  CL 97* 98* 101  CO2 26 25 26   GLUCOSE 150* 96 136*  BUN 6 7 8   CREATININE 0.62 0.64 0.75  CALCIUM 8.8* 8.4* 8.2*   LFT  Recent Labs  12/17/16 0502  PROT 6.5  ALBUMIN 2.5*  AST 59*  ALT 66*  ALKPHOS 322*  BILITOT 10.4*   Nm Hepatobiliary Including Gb  Result Date: 12/16/2016 CLINICAL DATA:  Abdominal pain with nausea vomiting. Common bile duct stent with metastatic disease in the region of the porta hepatis. Biliary dilatation. EXAM: NUCLEAR MEDICINE HEPATOBILIARY IMAGING TECHNIQUE: Sequential images of the abdomen were obtained out to 60 minutes following intravenous administration of radiopharmaceutical. RADIOPHARMACEUTICALS:  9.6 mCi Tc-61m  Choletec IV COMPARISON:  CT scan 12/14/2016 FINDINGS: Delayed radiotracer uptake by liver parenchyma and delayed clearance of blood pool activity is  compatible with underlying hepatocyte dysfunction. The was given IV morphine to facilitate gallbladder filling. After 2 hours of total observation, there is no visualization of bile ducts, gallbladder, or small bowel. IMPRESSION: Poor hepatic uptake of radiotracer with delayed blood pool clearance is associated with nonvisualization the bile ducts. Imaging features are consistent with biliary obstruction and hepatocyte dysfunction. Electronically Signed   By: Misty Stanley M.D.   On: 12/16/2016 11:52   Dg Ercp  Result Date: 12/16/2016 CLINICAL DATA:  Adenocarcinoma EXAM: ERCP TECHNIQUE: Multiple spot images obtained with the fluoroscopic device and submitted for interpretation post-procedure. FLUOROSCOPY TIME:  Fluoroscopy Time:  5 minutes and 25 seconds Radiation Exposure Index (if provided by the fluoroscopic device): 124.65 Number of Acquired Spot Images: 0 COMPARISON:  None. FINDINGS: There is a stent in the mid and upper common bile duct. Cannulation of the common bile duct is documented. Contrast fills the biliary tree. There is dilatation of the intrahepatic biliary tree. There is filling of the stented portion of the common bile duct preventing opacification of contrast. Final images demonstrate placement of a plastic stent extending from above the stent into the duodenum. IMPRESSION: Plastic stent placement across the common bile duct as described. These images were submitted for radiologic interpretation only. Please see the procedural report for the amount of contrast and the fluoroscopy time utilized. Electronically Signed   By: Marybelle Killings M.D.   On: 12/16/2016 14:53   Assessment / Plan:  1. Elevated LFT's:  Increasing since time of admission, metal biliary stent placed 10/15/16-LFT's had initially trended down, now Bili at 13, concern for biliary stent obstruction on imaging.  Underwent repeat ERCP on 7/11 with occlusion of existing stent so another stent was placed inside the old one.  Also  concern that intrahepatic being occluded by tumor burden.  LFT's trending down well so far.  Will recheck in AM. 2. H/o Metastatic colon cancer to liver: s/p hemicolectomy.  Supposed to begin cycle 2 salvage chemotherapy soon. 3. Sepsis due to Klebsiella pneumoniae:  Has been treated with multiple antibiotics including vancomycin, Zosyn, Rocephin, metronidazole, and now Unasyn--started 7/7.   LOS: 9 days   ZEHR, JESSICA D.  12/17/2016, 10:11 AM  Pager number [phone]

## 2016-12-17 NOTE — Progress Notes (Addendum)
Patient ID: Bryan Fields, male   DOB: 11/10/1960, 56 y.o.   MRN: [id number]  PROGRESS NOTE    Bryan Fields  MLY:[id number] DOB: Mar 23, 1961 DOA: 12/08/2016  PCP: Ria Bush, MD   Brief Narrative:    56 year old male with history of metastatic colon cancer, scheduled to begin cycle 2 salvage chemotherapy with Lonsurf 12/14/16). Pt presented to ED with sudden onset fever, chills and diarrhea started 1 day prior to the admission. On admission, pt ANC was 1,400. CXR and UA were not suggestive of an acute infectious process. He was started on empiric vanco and zosyn while awaiting culture results and stool studies. Stool studies have not been collected as his diarrhea has stopped in hospital.  Hospital course is complicated with rising bilirubin and concern for acute cholecystitis for which reason GI consulted.  Pt is now s/p ERCP with plastic biliary stent placed inside a previously placed metallic biliary stent. Per, GI continue to monitor bilirubin. If it fails to return to baseline, pt will require MRCP and IR for further evaluation.    Assessment & Plan:  Principal Problem: Febrile neutropenia (HCC) - Neutropenia resolved but still spiking low grade fevers, likely coming from biliary source  - Continue Unasyn   Active Problems: Sepsis due to Klebsiella pneumoniae  - Sepsis criteria met on admission with fever, tachypnea, neutropenia - Blood cultures grew klebsiella pneumoniae species - Stopped Rocephin and flagyl 7/7 - Will continue Unasyn which was started 7/7  Essential hypertension - Continue atenolol   Metastatic colon cancer to liver (HCC) / Abnormal LFT's - Stent in place - Pr GI, CT scan done 7/9; pt still with intrahepatic biliary ductal dilatation and also questionable gallbladder wall thickening and pericholecystic fluid? Acute cholecystitis - S/P ERCP 7/11 with plastic biliary stent placed inside the previous metallic biliary stent - Continue  to trend bilirubin; if trending up then pt needs MRCP and IR for further evaluation   Pancytopenia (HCC) / Anemia of chronic disease  - Counts stable   Nausea vomiting and diarrhea - Stool for C.diff neg - Diarrhea resolved - Appetite improving   Hypokalemia - Due to GI losses - Supplemented and WNL  DVT prophylaxis: Lovenox subQ Code Status: full code  Family Communication: wife at bedside Disposition Plan: home once bilirubin returns to baseline   Consultants:   Oncology, Dr. Benay Spice  GI, Dr. Zenovia Jarred  Procedures:   Nm Hepatobiliary Including Gb 12/16/2016 Poor hepatic uptake of radiotracer with delayed blood pool clearance is associated with nonvisualization the bile ducts. Imaging features are consistent with biliary obstruction and hepatocyte dysfunction.    Dg Ercp 12/16/2016 Plastic stent placement across the common bile duct as described. These images were submitted for radiologic interpretation only. Please see the procedural report for the amount of contrast and the fluoroscopy time utilized.    Antimicrobials:   Vanco and zosyn 7/2 - 7/4  Rocephin and flagyl 7/4 --> 7/7  Unasyn 7/7 -->   Subjective: No overnight events.  Objective: Vitals:   12/16/16 1541 12/16/16 2139 12/17/16 0541 12/17/16 1330  BP: 125/77 118/73 110/69 116/68  Pulse: 82 86 84 79  Resp: _0 Temp: 98 F (36.7 C) 98.3 F (36.8 C) 98.7 F (37.1 C) (!) 100.4 F (38 C)  TempSrc: Oral Oral Oral Oral  SpO2: 100% 99% 98% 99%  Weight:      Height:        Intake/Output Summary (Last 24 hours) at 12/17/16  [place] filed at 12/17/16 1330  Gross per 24 hour  Intake             1200 ml  Output                0 ml  Net             1200 ml   Filed Weights   12/08/16 2158 12/09/16 0605 12/16/16 1300  Weight: 110.5 kg (243 lb 9.7 oz) 110.5 kg (243 lb 8 oz) 105.2 kg (232 lb)    Examination:  General exam: Appears calm and comfortable  Respiratory  system: Clear to auscultation. Respiratory effort normal. Cardiovascular system: S1 & S2 heard, RRR.  Gastrointestinal system: Abdomen is nondistended, soft and nontender. No organomegaly or masses felt. Normal bowel sounds heard. Central nervous system: Alert and oriented. No focal neurological deficits. Extremities: Symmetric 5 x 5 power. Skin: jaundiced skin Psychiatry: Judgement and insight appear normal. Mood & affect appropriate.   Data Reviewed: I have personally reviewed following labs and imaging studies  CBC:  Recent Labs Lab 12/11/16 0447 12/12/16 0605 12/13/16 0515 12/15/16 0825  WBC 1.7* 2.0* 2.3* 4.5  HGB 9.8* 10.0* 9.5* 10.3*  HCT 28.0* 28.8* 27.6* 29.4*  MCV 91.2 90.9 91.1 90.7  PLT 140* 155 167 163   Basic Metabolic Panel:  Recent Labs Lab 12/13/16 0515 12/14/16 0524 12/15/16 0825 12/16/16 0500 12/17/16 0502  NA 135 134* 133* 133* 135  K 3.5 3.9 3.7 3.8 3.8  CL 102 101 97* 98* 101  CO2 _0 GLUCOSE 111* 87 150* 96 136*  BUN _1 CREATININE 0.65 0.60* 0.62 0.64 0.75  CALCIUM 8.5* 8.6* 8.8* 8.4* 8.2*   GFR: Estimated Creatinine Clearance: 124.3 mL/min (by C-G formula based on SCr of 0.75 mg/dL). Liver Function Tests:  Recent Labs Lab 12/13/16 0515 12/14/16 0524 12/15/16 0825 12/16/16 0500 12/17/16 0502  AST 58* 68* 86* 84* 59*  ALT 75* 74* 82* 80* 66*  ALKPHOS 236* 265* 346* 351* 322*  BILITOT 7.7* 10.0* 13.3* 14.1* 10.4*  PROT 6.1* 6.6 7.1 6.7 6.5  ALBUMIN 2.5* 2.7* 2.9* 2.7* 2.5*   No results for input(s): LIPASE, AMYLASE in the last 168 hours. No results for input(s): AMMONIA in the last 168 hours. Coagulation Profile: No results for input(s): INR, PROTIME in the last 168 hours. Cardiac Enzymes: No results for input(s): CKTOTAL, CKMB, CKMBINDEX, TROPONINI in the last 168 hours. BNP (last 3 results) No results for input(s): PROBNP in the last 8760 hours. HbA1C: No results for input(s): HGBA1C in the last 72  hours. CBG: No results for input(s): GLUCAP in the last 168 hours. Lipid Profile: No results for input(s): CHOL, HDL, LDLCALC, TRIG, CHOLHDL, LDLDIRECT in the last 72 hours. Thyroid Function Tests: No results for input(s): TSH, T4TOTAL, FREET4, T3FREE, THYROIDAB in the last 72 hours. Anemia Panel: No results for input(s): VITAMINB12, FOLATE, FERRITIN, TIBC, IRON, RETICCTPCT in the last 72 hours. Urine analysis:    Component Value Date/Time   COLORURINE AMBER (A) 12/08/2016 1708   APPEARANCEUR HAZY (A) 12/08/2016 1708   LABSPEC 1.035 (H) 12/08/2016 1708   LABSPEC 1.020 03/18/2015 0847   PHURINE 5.0 12/08/2016 1708   GLUCOSEU NEGATIVE 12/08/2016 1708   GLUCOSEU Negative 03/18/2015 0847   HGBUR NEGATIVE 12/08/2016 1708   HGBUR small 04/01/2010 0848   BILIRUBINUR MODERATE (A) 12/08/2016 1708   BILIRUBINUR Negative 03/18/2015 [place] 12/08/2016 1708  PROTEINUR 100 (A) 12/08/2016 1708   UROBILINOGEN 0.2 03/18/2015 0847   NITRITE NEGATIVE 12/08/2016 1708   LEUKOCYTESUR NEGATIVE 12/08/2016 1708   LEUKOCYTESUR Negative 03/18/2015 0847   Sepsis Labs: _0 (procalcitonin:4,lacticidven:4)   ) Recent Results (from the past 240 hour(s))  Blood Culture (routine x 2)     Status: None   Collection Time: 12/08/16  5:08 PM  Result Value Ref Range Status   Specimen Description BLOOD RIGHT ANTECUBITAL  Final   Special Requests   Final    BOTTLES DRAWN AEROBIC AND ANAEROBIC Blood Culture adequate volume   Culture   Final    NO GROWTH 5 DAYS Performed at [facility], [place] [address] [place], [place] [address]    Report Status 12/13/2016 FINAL  Final  Urine culture     Status: Abnormal   Collection Time: 12/08/16  5:08 PM  Result Value Ref Range Status   Specimen Description URINE, RANDOM  Final   Special Requests Immunocompromised  Final   Culture MULTIPLE SPECIES PRESENT, SUGGEST RECOLLECTION (A)  Final   Report Status 12/10/2016 FINAL  Final  Blood  Culture (routine x 2)     Status: Abnormal   Collection Time: 12/08/16  7:02 PM  Result Value Ref Range Status   Specimen Description LEFT ANTECUBITAL  Final   Special Requests IN PEDIATRIC BOTTLE Blood Culture adequate volume  Final   Culture  Setup Time   Final    GRAM NEGATIVE RODS IN PEDIATRIC BOTTLE CRITICAL RESULT CALLED TO, READ BACK BY AND VERIFIED WITH: Christean Grief 10:30 12/09/16 ([email or web address]) Performed at [facility], [place] [address] [place], [place] [address]    Culture KLEBSIELLA PNEUMONIAE (A)  Final   Report Status 12/11/2016 FINAL  Final   Organism ID, Bacteria KLEBSIELLA PNEUMONIAE  Final      Susceptibility   Klebsiella pneumoniae - MIC*    AMPICILLIN RESISTANT Resistant     CEFAZOLIN <=4 SENSITIVE Sensitive     CEFEPIME <=1 SENSITIVE Sensitive     CEFTAZIDIME <=1 SENSITIVE Sensitive     CEFTRIAXONE <=1 SENSITIVE Sensitive     CIPROFLOXACIN <=0.25 SENSITIVE Sensitive     GENTAMICIN <=1 SENSITIVE Sensitive     IMIPENEM <=0.25 SENSITIVE Sensitive     TRIMETH/SULFA <=20 SENSITIVE Sensitive     AMPICILLIN/SULBACTAM 4 SENSITIVE Sensitive     PIP/TAZO <=4 SENSITIVE Sensitive     Extended ESBL NEGATIVE Sensitive     * KLEBSIELLA PNEUMONIAE  Blood Culture ID Panel (Reflexed)     Status: Abnormal   Collection Time: 12/08/16  7:02 PM  Result Value Ref Range Status   Enterococcus species NOT DETECTED NOT DETECTED Final   Listeria monocytogenes NOT DETECTED NOT DETECTED Final   Staphylococcus species NOT DETECTED NOT DETECTED Final   Staphylococcus aureus NOT DETECTED NOT DETECTED Final   Streptococcus species NOT DETECTED NOT DETECTED Final   Streptococcus agalactiae NOT DETECTED NOT DETECTED Final   Streptococcus pneumoniae NOT DETECTED NOT DETECTED Final   Streptococcus pyogenes NOT DETECTED NOT DETECTED Final   Acinetobacter baumannii NOT DETECTED NOT DETECTED Final   Enterobacteriaceae species DETECTED (A) NOT DETECTED Final    Comment: Enterobacteriaceae represent a  large family of gram-negative bacteria, not a single organism. CRITICAL RESULT CALLED TO, READ BACK BY AND VERIFIED WITH: 10:30 12/09/16 ([email or web address])    Enterobacter cloacae complex NOT DETECTED NOT DETECTED Final   Escherichia coli NOT DETECTED NOT DETECTED Final   Klebsiella oxytoca NOT DETECTED NOT DETECTED Final  Klebsiella pneumoniae DETECTED (A) NOT DETECTED Final    Comment: CRITICAL RESULT CALLED TO, READ BACK BY AND VERIFIED WITH: Christean Grief Pharm.D. 10:30 12/09/16 ([email or web address])    Proteus species NOT DETECTED NOT DETECTED Final   Serratia marcescens NOT DETECTED NOT DETECTED Final   Carbapenem resistance NOT DETECTED NOT DETECTED Final   Haemophilus influenzae NOT DETECTED NOT DETECTED Final   Neisseria meningitidis NOT DETECTED NOT DETECTED Final   Pseudomonas aeruginosa NOT DETECTED NOT DETECTED Final   Candida albicans NOT DETECTED NOT DETECTED Final   Candida glabrata NOT DETECTED NOT DETECTED Final   Candida krusei NOT DETECTED NOT DETECTED Final   Candida parapsilosis NOT DETECTED NOT DETECTED Final   Candida tropicalis NOT DETECTED NOT DETECTED Final    Comment: Performed at [facility], [address] [place], [place] [address]  Culture, blood (routine x 2)     Status: None   Collection Time: 12/10/16  5:39 AM  Result Value Ref Range Status   Specimen Description BLOOD RIGHT ARM  Final   Special Requests   Final    BOTTLES DRAWN AEROBIC AND ANAEROBIC Blood Culture adequate volume   Culture   Final    NO GROWTH 5 DAYS Performed at [facility], [address] [place], [place] [address]    Report Status 12/15/2016 FINAL  Final  Culture, blood (routine x 2)     Status: None   Collection Time: 12/10/16  5:46 AM  Result Value Ref Range Status   Specimen Description BLOOD LEFT ARM  Final   Special Requests   Final    BOTTLES DRAWN AEROBIC AND ANAEROBIC Blood Culture adequate volume   Culture   Final    NO GROWTH 5 DAYS Performed at [facility],  [place] [address], [place], [place] [address]    Report Status 12/15/2016 FINAL  Final  C difficile quick scan w PCR reflex     Status: None   Collection Time: 12/10/16  6:53 PM  Result Value Ref Range Status   C Diff antigen NEGATIVE NEGATIVE Final   C Diff toxin NEGATIVE NEGATIVE Final   C Diff interpretation No C. difficile detected.  Final      Radiology Studies: Nm Hepatobiliary Including Gb  Result Date: 12/16/2016 CLINICAL DATA:  Abdominal pain with nausea vomiting. Common bile duct stent with metastatic disease in the region of the porta hepatis. Biliary dilatation. EXAM: NUCLEAR MEDICINE HEPATOBILIARY IMAGING TECHNIQUE: Sequential images of the abdomen were obtained out to 60 minutes following intravenous administration of radiopharmaceutical. RADIOPHARMACEUTICALS:  9.6 mCi Tc-28m Choletec IV COMPARISON:  CT scan 12/14/2016 FINDINGS: Delayed radiotracer uptake by liver parenchyma and delayed clearance of blood pool activity is compatible with underlying hepatocyte dysfunction. The was given IV morphine to facilitate gallbladder filling. After 2 hours of total observation, there is no visualization of bile ducts, gallbladder, or small bowel. IMPRESSION: Poor hepatic uptake of radiotracer with delayed blood pool clearance is associated with nonvisualization the bile ducts. Imaging features are consistent with biliary obstruction and hepatocyte dysfunction. Electronically Signed   By: EMisty StanleyM.D.   On: 12/16/2016 11:52   Ct Abdomen Pelvis W Contrast  Result Date: 12/14/2016 CLINICAL DATA:  Metastatic colon cancer. Sudden onset fever and chills with diarrhea 1 day ago. EXAM: CT ABDOMEN AND PELVIS WITH CONTRAST TECHNIQUE: Multidetector CT imaging of the abdomen and pelvis was performed using the standard protocol following bolus administration of intravenous contrast. CONTRAST:  142mISOVUE-300 IOPAMIDOL (  ISOVUE-300) INJECTION 61%, 143m ISOVUE-300 IOPAMIDOL (ISOVUE-300) INJECTION 61%  COMPARISON:  Ultrasound exam 12/08/2016.  CT scan 10/02/2016 FINDINGS: Lower chest: Bilateral gynecomastia, incompletely visualized. Coronary artery calcification is noted. Hepatobiliary: Left hepatectomy. 2.9 x 3.7 cm mass along the resection margin of the liver is similar to prior. 16 mm lesion seen in the dome of the liver on prior study measures 2.2 cm today. There is a new 1.9 cm ill-defined low-density liver lesion seen in the right liver on image 17 series 2. A common duct stent is new in the interval and crosses a irregular and ill-defined soft tissue mass in the porta hepatis measuring 6.9 x 3.9 cm. The stent tracks from about the biliary confluence in the liver to the pancreas, but does not extend down the common bile duct into the duodenal lumen. There is mild intrahepatic biliary duct dilatation proximal to the stent, progressed in the interval since prior study. Gallbladder is distended with layering tiny gallstones. Gallbladder wall is ill-defined. There is a small amount of fluid in the hepatoduodenal ligament adjacent to the neck of the gallbladder and second portion of the duodenum. Pancreas: Diffusely atrophic. Spleen: No splenomegaly. No focal mass lesion. Adrenals/Urinary Tract: No adrenal nodule or mass. Kidneys are unremarkable. No evidence for hydroureter. The urinary bladder appears normal for the degree of distention. Stomach/Bowel: Stomach is decompressed. Duodenum is normally positioned as is the ligament of Treitz. No small bowel wall thickening. No small bowel dilatation. Right hemicolectomy. Vascular/Lymphatic: There is abdominal aortic atherosclerosis without aneurysm. Lymphadenopathy in the porta hepatis is similar. No pelvic sidewall lymphadenopathy. Reproductive: The prostate gland and seminal vesicles have normal imaging features. Other: Left omental lesion measured at 3.0 cm previously is now 3.5 cm. 2.0 x 3.3 cm lesion in the right rectus sheath was 2.1 x 2.7 cm previously. There  is trace fluid in the inferior right liver which tracks down the right para colic gutter. Musculoskeletal: IMPRESSION: 1. Since the prior CT study, there has been a common duct stent placed with intrahepatic biliary duct dilatation on today's CT scan similar to a potentially minimally increased since the ultrasound exam 5 days ago. There is a distended gallbladder with small volume pericholecystic fluid and a small amount of fluid in the porta hepatis, down along the right liver and tracking in the right para colic gutter. Acute cholecystitis is a consideration. Given the biliary dilatation, cholangitis cannot be excluded by CT imaging. 2. Interval progression of pre-existing liver lesion within new metastatic lesion identified in the right liver today measuring 1.9 cm. 3. Persistent recurrent neoplasm along the resection margin of the liver and in the porta hepatis, not substantially changed in the interval since prior CT. Additional sites of metastatic disease are seen in the anterior abdominal wall and left omentum, also similar to prior. Electronically Signed   By: EMisty StanleyM.D.   On: 12/14/2016 19:19   Dg Ercp  Result Date: 12/16/2016 CLINICAL DATA:  Adenocarcinoma EXAM: ERCP TECHNIQUE: Multiple spot images obtained with the fluoroscopic device and submitted for interpretation post-procedure. FLUOROSCOPY TIME:  Fluoroscopy Time:  5 minutes and 25 seconds Radiation Exposure Index (if provided by the fluoroscopic device): 124.65 Number of Acquired Spot Images: 0 COMPARISON:  None. FINDINGS: There is a stent in the mid and upper common bile duct. Cannulation of the common bile duct is documented. Contrast fills the biliary tree. There is dilatation of the intrahepatic biliary tree. There is filling of the stented portion of the common bile duct preventing  opacification of contrast. Final images demonstrate placement of a plastic stent extending from above the stent into the duodenum. IMPRESSION: Plastic  stent placement across the common bile duct as described. These images were submitted for radiologic interpretation only. Please see the procedural report for the amount of contrast and the fluoroscopy time utilized. Electronically Signed   By: Marybelle Killings M.D.   On: 12/16/2016 14:53        Scheduled Meds: . acyclovir ointment   Topical Q3H  . atenolol  50 mg Oral Daily  . enoxaparin (LOVENOX) injection  40 mg Subcutaneous Q24H  . famotidine  20 mg Oral Daily  . indomethacin  100 mg Rectal Once  . lactose free nutrition  237 mL Oral TID WC  . metoCLOPramide  10 mg Oral TID AC  . pantoprazole  40 mg Oral Daily  . polyethylene glycol  17 g Oral BID  . potassium chloride SA  40 mEq Oral Daily  . senna-docusate  1 tablet Oral BID  . vitamin B-12  2,500 mcg Oral Daily   Continuous Infusions: . ampicillin-sulbactam (UNASYN) IV Stopped (12/17/16 1306)     LOS: 9 days    Time spent: 25 minutes  Greater than 50% of the time spent on counseling and coordinating the care.   Leisa Lenz, MD [facility] Pager [phone]  If 7PM-7AM, please contact night-coverage [email or web address] Password [facility] 12/17/2016, 4:45 PM

## 2016-12-18 ENCOUNTER — Encounter (HOSPITAL_COMMUNITY): Payer: Self-pay | Admitting: Internal Medicine

## 2016-12-18 DIAGNOSIS — I10 Essential (primary) hypertension: Secondary | ICD-10-CM

## 2016-12-18 LAB — COMPREHENSIVE METABOLIC PANEL WITH GFR
ALT: 63 U/L (ref 17–63)
AST: 52 U/L — ABNORMAL HIGH (ref 15–41)
Albumin: 2.6 g/dL — ABNORMAL LOW (ref 3.5–5.0)
Alkaline Phosphatase: 318 U/L — ABNORMAL HIGH (ref 38–126)
Anion gap: 7 (ref 5–15)
BUN: 10 mg/dL (ref 6–20)
CO2: 27 mmol/L (ref 22–32)
Calcium: 8.5 mg/dL — ABNORMAL LOW (ref 8.9–10.3)
Chloride: 101 mmol/L (ref 101–111)
Creatinine, Ser: 0.86 mg/dL (ref 0.61–1.24)
GFR calc Af Amer: >60 mL/min
GFR calc non Af Amer: >60 mL/min
Glucose, Bld: 96 mg/dL (ref 65–99)
Potassium: 4.1 mmol/L (ref 3.5–5.1)
Sodium: 135 mmol/L (ref 135–145)
Total Bilirubin: 6.5 mg/dL — ABNORMAL HIGH (ref 0.3–1.2)
Total Protein: 6.6 g/dL (ref 6.5–8.1)

## 2016-12-18 MED ORDER — HEPARIN SOD (PORK) LOCK FLUSH 100 UNIT/ML IV SOLN
500.0000 [IU] | Freq: Once | INTRAVENOUS | Status: DC
  Filled 2016-12-18: qty 5

## 2016-12-18 MED ORDER — HYDROMORPHONE HCL 2 MG PO TABS
2.0000 mg | ORAL_TABLET | Freq: Once | ORAL | Status: AC
  Administered 2016-12-18: 2 mg via ORAL
  Filled 2016-12-18: qty 1

## 2016-12-18 MED ORDER — SODIUM CHLORIDE 0.9 % IV BOLUS (SEPSIS): 500.0000 mL | Freq: Once | INTRAVENOUS | Status: DC

## 2016-12-18 NOTE — Progress Notes (Signed)
[facility] Progress Note  Subjective:  Feels good.  No significant abdominal pain. Eating better each day.  No diarrhea.  Did have a low grade temp of 100.4 overnight.  Anxious for discharge.  Objective:  Vital signs in last 24 hours: Temp:  [98.4 F (36.9 C)-100.4 F (38 C)] 98.5 F (36.9 C) (07/13 0650) Pulse Rate:  [63-79] 63 (07/13 0650) Resp:  [16-20] 20 (07/13 0650) BP: (92-116)/(58-68) 92/59 (07/13 0650) SpO2:  [96 %-100 %] 96 % (07/13 0650) Last BM Date: 12/16/16 General:  Alert, Well-developed, in NAD; jaundice noted. Heart:  Regular rate and rhythm; no murmurs Pulm:  No increased WOB. Abdomen:  Soft, non-distended.  Normal bowel sounds.  Non-tender.  Extremities:  Without edema. Neurologic:  Alert and oriented x 4;  grossly normal neurologically. Psych:  Alert and cooperative. Normal mood and affect.  Intake/Output from previous day: 07/12 0701 - 07/13 0700 In: 720 [P.O.:720] Out: -   BMET  Recent Labs  12/16/16 0500 12/17/16 0502 12/18/16 0500  NA 133* 135 135  K 3.8 3.8 4.1  CL 98* 101 101  CO2 25 26 27   GLUCOSE 96 136* 96  BUN 7 8 10   CREATININE 0.64 0.75 0.86  CALCIUM 8.4* 8.2* 8.5*   LFT  Recent Labs  12/18/16 0500  PROT 6.6  ALBUMIN 2.6*  AST 52*  ALT 63  ALKPHOS 318*  BILITOT 6.5*   Nm Hepatobiliary Including Gb  Result Date: 12/16/2016 CLINICAL DATA:  Abdominal pain with nausea vomiting. Common bile duct stent with metastatic disease in the region of the porta hepatis. Biliary dilatation. EXAM: NUCLEAR MEDICINE HEPATOBILIARY IMAGING TECHNIQUE: Sequential images of the abdomen were obtained out to 60 minutes following intravenous administration of radiopharmaceutical. RADIOPHARMACEUTICALS:  9.6 mCi Tc-17m  Choletec IV COMPARISON:  CT scan 12/14/2016 FINDINGS: Delayed radiotracer uptake by liver parenchyma and delayed clearance of blood pool activity is compatible with underlying hepatocyte dysfunction. The was given IV  morphine to facilitate gallbladder filling. After 2 hours of total observation, there is no visualization of bile ducts, gallbladder, or small bowel. IMPRESSION: Poor hepatic uptake of radiotracer with delayed blood pool clearance is associated with nonvisualization the bile ducts. Imaging features are consistent with biliary obstruction and hepatocyte dysfunction. Electronically Signed   By: Misty Stanley M.D.   On: 12/16/2016 11:52   Dg Ercp  Result Date: 12/16/2016 CLINICAL DATA:  Adenocarcinoma EXAM: ERCP TECHNIQUE: Multiple spot images obtained with the fluoroscopic device and submitted for interpretation post-procedure. FLUOROSCOPY TIME:  Fluoroscopy Time:  5 minutes and 25 seconds Radiation Exposure Index (if provided by the fluoroscopic device): 124.65 Number of Acquired Spot Images: 0 COMPARISON:  None. FINDINGS: There is a stent in the mid and upper common bile duct. Cannulation of the common bile duct is documented. Contrast fills the biliary tree. There is dilatation of the intrahepatic biliary tree. There is filling of the stented portion of the common bile duct preventing opacification of contrast. Final images demonstrate placement of a plastic stent extending from above the stent into the duodenum. IMPRESSION: Plastic stent placement across the common bile duct as described. These images were submitted for radiologic interpretation only. Please see the procedural report for the amount of contrast and the fluoroscopy time utilized. Electronically Signed   By: Marybelle Killings M.D.   On: 12/16/2016 14:53   Assessment / Plan: 1. Elevated LFT's:Increasing since time of admission, metal biliary stent placed 10/15/16-LFT's had initially trended down, now Bili at 13, concern  for biliary stent obstruction on imaging.  Underwent repeat ERCP on 7/11 with occlusion of existing stent so another stent was placed inside the old one.  Also concern that intrahepatic being occluded by tumor burden.  LFT's  trending down well so far. 2. H/o Metastatic colon cancer to liver: s/p hemicolectomy. Supposed to begin cycle 2 salvage chemotherapy soon. 3. Sepsis due to Klebsiella pneumoniae: Has been treated with multiple antibiotics including vancomycin, Zosyn, Rocephin,metronidazole,and now Unasyn--started 7/7.  ? How long to treat with antibiotics and what PO abx to use.  *Will check with Dr. Hilarie Fredrickson regarding possible D/C home later today.   LOS: 10 days   Bryan Fields D.  12/18/2016, 9:08 AM  Pager number [phone]

## 2016-12-18 NOTE — Discharge Summary (Addendum)
Physician Discharge Summary  LUVERN MCISAAC  KVQ:[id number]  DOB: [birth year]  DOA: 12/08/2016 PCP: Ria Bush, MD  Admit date: 12/08/2016 Discharge date: 12/18/2016  Admitted From: Home Disposition:  Home  Recommendations for Outpatient Follow-up:  1. Follow up with PCP in 1-2 weeks 2. Please obtain CMP/CBC in one week to monitor liver function and hemoglobin  Discharge Condition: Stable  CODE STATUS: Full code  Diet recommendation: Heart Healthy   Brief/Interim Summary: 57 year old male with history of metastatic colon cancer presented to the ED complaining of sudden onset fever, diarrhea that started 1 day prior to admission. Patient was found to have an [place] of 1400. Chest x-ray and UA were not suggestive of acute infectious process however he was started on empiric antibiotic while awaiting for cultures. During hospital course bilirubin rise and there was concern of acute cholecystitis for which GI was consulted patient had ERCP with stent placement over previously placed stent. Patient blood culture grew Klebsiella pneumoniae, for which was treated with Unasyn. Patient has clinically improve, report good oral intake, denies abdominal pain and has been afebrile for over 24 hours. GI has cleared patient for discharge and to follow-up with primary care physician in one week to monitor blood levels.  Subjective: Patient seen and examined on the day of discharge, he reported mild discomfort on his right upper quadrant abdomen. He denies any nausea, vomiting, diarrhea and constipation. Jaundice has improved. Tolerating diet well, ambulating without issues.  Discharge Diagnoses/Hospital Course:  Febrile neutropenia  Initially neutropenic fever which has resolved, source of infection was biliary track.  Sepsis due to Klebsiella pneumoniae bacteremia Sepsis criteria met on admission with fever, tachypnea, neutropenia - sepsis physiology has resolved. Blood cultures grew klebsiella  pneumoniae species Initially treated with Rocephin and flagyl 7/7 - switched to Unasyn which was started 7/7 Patient completed a total of 10 days of antibiotic. Case was discussed with infectious disease who recommended to stop antibiotics as obstruction has been released with biliary stent. Patient is to follow-up with primary care physician. Patient is recommended in case of bike any fever above 101, to call GI office or return to the emergency department.   Essential hypertension BP was acceptable during hospital stay No changes in medication were made  Suggests changing atenolol for another agent, will defer to PCP  Metastatic colon cancer to liver [facility]) / Abnormal LFT's Stent in place Pr GI, CT scan done 7/9; pt still with intrahepatic biliary ductal dilatation and also questionable gallbladder wall thickening and pericholecystic fluid? Acute cholecystitis S/P ERCP 7/11 with plastic biliary stent placed inside the previous metallic biliary stent Bilirubin trending down, GI team has cleared her for discharge. Patient has a follow-up appointment with oncologist next Friday  Pancytopenia ([place]) / Anemia of chronic disease  Check CBC in one week  Nausea vomiting and diarrhea Stool for C.diff neg Diarrhea resolved  Hypokalemia Due to GI losses Supplemented and WNL  All other chronic medical condition were stable during the hospitalization.  On the day of the discharge the patient's vitals were stable, and no other acute medical condition were reported by patient. Patient was felt safe to be discharge to home   Discharge Instructions  You were cared for by a hospitalist during your hospital stay. If you have any questions about your discharge medications or the care you received while you were in the hospital after you are discharged, you can call the unit and asked to speak with the hospitalist on call if the hospitalist  that took care of you is not available. Once you are  discharged, your primary care physician will handle any further medical issues. Please note that NO REFILLS for any discharge medications will be authorized once you are discharged, as it is imperative that you return to your primary care physician (or establish a relationship with a primary care physician if you do not have one) for your aftercare needs so that they can reassess your need for medications and monitor your lab values.  Discharge Instructions    Call MD for:  difficulty breathing, headache or visual disturbances    Complete by:  As directed    Call MD for:  extreme fatigue    Complete by:  As directed    Call MD for:  hives    Complete by:  As directed    Call MD for:  persistant dizziness or light-headedness    Complete by:  As directed    Call MD for:  persistant nausea and vomiting    Complete by:  As directed    Call MD for:  redness, tenderness, or signs of infection (pain, swelling, redness, odor or green/yellow discharge around incision site)    Complete by:  As directed    Call MD for:  severe uncontrolled pain    Complete by:  As directed    Call MD for:  temperature >100.4    Complete by:  As directed    Diet - low sodium heart healthy    Complete by:  As directed    Increase activity slowly    Complete by:  As directed      Allergies as of 12/18/2016      Reactions   Other    Chemo therapy drug: Oxali Caused itching and redness.       Medication List    STOP taking these medications   simvastatin 40 MG tablet Commonly known as:  ZOCOR     TAKE these medications   acetaminophen 325 MG tablet Commonly known as:  TYLENOL Take 650 mg by mouth every 6 (six) hours as needed for moderate pain or fever.   atenolol 50 MG tablet Commonly known as:  TENORMIN Take 1 tablet (50 mg total) by mouth daily.   diazepam 5 MG tablet Commonly known as:  VALIUM Take 5 mg by mouth 3 (three) times daily as needed.   HYDROmorphone 2 MG tablet Commonly known as:   DILAUDID Take 1-2 tablets (2-4 mg total) by mouth every 4 (four) hours as needed for severe pain.   ibuprofen 200 MG tablet Commonly known as:  ADVIL,MOTRIN Take 600 mg by mouth every 6 (six) hours as needed for mild pain.   KLOR-CON M20 20 MEQ tablet Generic drug:  potassium chloride SA Take 20 mEq by mouth daily.   lidocaine-prilocaine cream Commonly known as:  EMLA Apply 1 application topically as needed (apply to port).   metoCLOPramide 10 MG tablet Commonly known as:  REGLAN Take 1 tablet (10 mg total) by mouth daily before breakfast.   omeprazole 40 MG capsule Commonly known as:  PRILOSEC Take 1 capsule (40 mg total) by mouth every other day. What changed:  when to take this   ondansetron 8 MG tablet Commonly known as:  ZOFRAN Take 1 tablet (8 mg total) by mouth every 8 (eight) hours as needed for nausea or vomiting.   polyethylene glycol packet Commonly known as:  MIRALAX / GLYCOLAX Take 17 g by mouth daily.   prochlorperazine 10  MG tablet Commonly known as:  COMPAZINE Take 1 tablet (10 mg total) by mouth every 6 (six) hours as needed for nausea or vomiting.   ranitidine 150 MG tablet Commonly known as:  ZANTAC Take 150 mg by mouth daily.   sildenafil 100 MG tablet Commonly known as:  VIAGRA Take 100 mg by mouth daily as needed for erectile dysfunction. Reported on 06/17/2015   tiZANidine 4 MG tablet Commonly known as:  ZANAFLEX Take 4 mg by mouth every 6 (six) hours as needed for muscle spasms.   trifluridine-tipiracil 20-8.19 MG tablet Commonly known as:  LONSURF Take 4 tablets (80 mg of trifluridine total) by mouth 2 (two) times daily after a meal. On days 1-5, 8-12. Repeat every 28days   Vitamin B-12 2500 MCG Subl Place 1 tablet under the tongue daily.      Follow-up Information    Ria Bush, MD. Schedule an appointment as soon as possible for a visit in 1 week(s).   Specialty:  Family Medicine Why:  Hospital follow-up Contact  information: [place] [address] [phone]        Jerene Bears, MD. Schedule an appointment as soon as possible for a visit in 4 week(s).   Specialty:  Gastroenterology Why:  Hospital follow-up Contact information: [address] [address] [phone]          Allergies  Allergen Reactions  . Other     Chemo therapy drug: Oxali Caused itching and redness.     Consultations:  GI  Oncology   Procedures/Studies: Nm Hepatobiliary Including Gb  Result Date: 12/16/2016 CLINICAL DATA:  Abdominal pain with nausea vomiting. Common bile duct stent with metastatic disease in the region of the porta hepatis. Biliary dilatation. EXAM: NUCLEAR MEDICINE HEPATOBILIARY IMAGING TECHNIQUE: Sequential images of the abdomen were obtained out to 60 minutes following intravenous administration of radiopharmaceutical. RADIOPHARMACEUTICALS:  9.6 mCi Tc-28m Choletec IV COMPARISON:  CT scan 12/14/2016 FINDINGS: Delayed radiotracer uptake by liver parenchyma and delayed clearance of blood pool activity is compatible with underlying hepatocyte dysfunction. The was given IV morphine to facilitate gallbladder filling. After 2 hours of total observation, there is no visualization of bile ducts, gallbladder, or small bowel. IMPRESSION: Poor hepatic uptake of radiotracer with delayed blood pool clearance is associated with nonvisualization the bile ducts. Imaging features are consistent with biliary obstruction and hepatocyte dysfunction. Electronically Signed   By: EMisty StanleyM.D.   On: 12/16/2016 11:52   Ct Abdomen Pelvis W Contrast  Result Date: 12/14/2016 CLINICAL DATA:  Metastatic colon cancer. Sudden onset fever and chills with diarrhea 1 day ago. EXAM: CT ABDOMEN AND PELVIS WITH CONTRAST TECHNIQUE: Multidetector CT imaging of the abdomen and pelvis was performed using the standard protocol following bolus administration of intravenous contrast. CONTRAST:   148mISOVUE-300 IOPAMIDOL (ISOVUE-300) INJECTION 61%, 10070mSOVUE-300 IOPAMIDOL (ISOVUE-300) INJECTION 61% COMPARISON:  Ultrasound exam 12/08/2016.  CT scan 10/02/2016 FINDINGS: Lower chest: Bilateral gynecomastia, incompletely visualized. Coronary artery calcification is noted. Hepatobiliary: Left hepatectomy. 2.9 x 3.7 cm mass along the resection margin of the liver is similar to prior. 16 mm lesion seen in the dome of the liver on prior study measures 2.2 cm today. There is a new 1.9 cm ill-defined low-density liver lesion seen in the right liver on image 17 series 2. A common duct stent is new in the interval and crosses a irregular and ill-defined soft tissue mass in the porta hepatis measuring 6.9 x 3.9  cm. The stent tracks from about the biliary confluence in the liver to the pancreas, but does not extend down the common bile duct into the duodenal lumen. There is mild intrahepatic biliary duct dilatation proximal to the stent, progressed in the interval since prior study. Gallbladder is distended with layering tiny gallstones. Gallbladder wall is ill-defined. There is a small amount of fluid in the hepatoduodenal ligament adjacent to the neck of the gallbladder and second portion of the duodenum. Pancreas: Diffusely atrophic. Spleen: No splenomegaly. No focal mass lesion. Adrenals/Urinary Tract: No adrenal nodule or mass. Kidneys are unremarkable. No evidence for hydroureter. The urinary bladder appears normal for the degree of distention. Stomach/Bowel: Stomach is decompressed. Duodenum is normally positioned as is the ligament of Treitz. No small bowel wall thickening. No small bowel dilatation. Right hemicolectomy. Vascular/Lymphatic: There is abdominal aortic atherosclerosis without aneurysm. Lymphadenopathy in the porta hepatis is similar. No pelvic sidewall lymphadenopathy. Reproductive: The prostate gland and seminal vesicles have normal imaging features. Other: Left omental lesion measured at 3.0 cm  previously is now 3.5 cm. 2.0 x 3.3 cm lesion in the right rectus sheath was 2.1 x 2.7 cm previously. There is trace fluid in the inferior right liver which tracks down the right para colic gutter. Musculoskeletal: IMPRESSION: 1. Since the prior CT study, there has been a common duct stent placed with intrahepatic biliary duct dilatation on today's CT scan similar to a potentially minimally increased since the ultrasound exam 5 days ago. There is a distended gallbladder with small volume pericholecystic fluid and a small amount of fluid in the porta hepatis, down along the right liver and tracking in the right para colic gutter. Acute cholecystitis is a consideration. Given the biliary dilatation, cholangitis cannot be excluded by CT imaging. 2. Interval progression of pre-existing liver lesion within new metastatic lesion identified in the right liver today measuring 1.9 cm. 3. Persistent recurrent neoplasm along the resection margin of the liver and in the porta hepatis, not substantially changed in the interval since prior CT. Additional sites of metastatic disease are seen in the anterior abdominal wall and left omentum, also similar to prior. Electronically Signed   By: Misty Stanley M.D.   On: 12/14/2016 19:19   Dg Ercp  Result Date: 12/16/2016 CLINICAL DATA:  Adenocarcinoma EXAM: ERCP TECHNIQUE: Multiple spot images obtained with the fluoroscopic device and submitted for interpretation post-procedure. FLUOROSCOPY TIME:  Fluoroscopy Time:  5 minutes and 25 seconds Radiation Exposure Index (if provided by the fluoroscopic device): 124.65 Number of Acquired Spot Images: 0 COMPARISON:  None. FINDINGS: There is a stent in the mid and upper common bile duct. Cannulation of the common bile duct is documented. Contrast fills the biliary tree. There is dilatation of the intrahepatic biliary tree. There is filling of the stented portion of the common bile duct preventing opacification of contrast. Final images  demonstrate placement of a plastic stent extending from above the stent into the duodenum. IMPRESSION: Plastic stent placement across the common bile duct as described. These images were submitted for radiologic interpretation only. Please see the procedural report for the amount of contrast and the fluoroscopy time utilized. Electronically Signed   By: Marybelle Killings M.D.   On: 12/16/2016 14:53   Dg Abd Acute W/chest  Result Date: 12/08/2016 CLINICAL DATA:  Fever, nausea, vomiting and diarrhea since yesterday. History of metastatic colon carcinoma. EXAM: DG ABDOMEN ACUTE W/ 1V CHEST COMPARISON:  Plain films the abdomen 10/29/2016. CT chest, abdomen and pelvis 05/25/2016.  FINDINGS: Single-view of the chest demonstrates a right IJ approach Port-A-Cath in place. Minimal atelectasis is seen in the left lung base. The lungs are otherwise clear. Heart size is normal. No pneumothorax or pleural effusion. Two views of the abdomen show no free intraperitoneal air. The bowel gas pattern is nonobstructive. Multiple surgical clips project in the upper abdomen. Common bile duct stent is in place. IMPRESSION: No acute finding chest or abdomen. Electronically Signed   By: Inge Rise M.D.   On: 12/08/2016 18:39   US Abdomen Limited Ruq  Result Date: 12/08/2016 CLINICAL DATA:  Biliary obstruction due to cancer. Biliary stent placed on 10/15/2016. LFT and jaundice has normalized with recurrence of fever, nausea and vomiting. EXAM: ULTRASOUND ABDOMEN LIMITED RIGHT UPPER QUADRANT COMPARISON:  10/02/2016 CT FINDINGS: Gallbladder: Minimal debris, sludge or calculi along the dependent aspect of the gallbladder without wall thickening visualized. No sonographic Murphy sign noted by sonographer. Common bile duct: Diameter: 2.4 mm.  Biliary stent partially imaged. Liver: Heterogeneous appearance of the liver parenchyma with mild intrahepatic ductal dilatation noted in the porta hepatis. IMPRESSION: Minimal debris, biliary sludge  or calculi along the dependent wall of the gallbladder without secondary signs acute cholecystitis. A biliary stent is partially imaged with mild intrahepatic ductal dilatation noted. A discrete liver lesion is not identified on images provided but better characterized on the most recent CT. Electronically Signed   By: Ashley Royalty M.D.   On: 12/08/2016 21:55    Discharge Exam: Vitals:   12/18/16 1140 12/18/16 1356  BP: 115/67 105/81  Pulse: 68 63  Resp:  18  Temp:  98.9 F (37.2 C)   Vitals:   12/17/16 2148 12/18/16 0650 12/18/16 1140 12/18/16 1356  BP: (!) 96/58 (!) 92/59 115/67 105/81  Pulse: 70 63 68 63  Resp: _0 Temp: 98.4 F (36.9 C) 98.5 F (36.9 C)  98.9 F (37.2 C)  TempSrc: Oral Oral  Oral  SpO2: 100% 96%  98%  Weight:      Height:        General: Pt is alert, awake, not in acute distress, jaundice and mild icteric 1+ Cardiovascular: RRR, S1/S2 +, no rubs, no gallops Respiratory: CTA bilaterally, no wheezing, no rhonchi Abdominal: Soft, NT, ND, bowel sounds + Extremities: no edema, no cyanosis   The results of significant diagnostics from this hospitalization (including imaging, microbiology, ancillary and laboratory) are listed below for reference.     Microbiology: Recent Results (from the past 240 hour(s))  Blood Culture (routine x 2)     Status: None   Collection Time: 12/08/16  5:08 PM  Result Value Ref Range Status   Specimen Description BLOOD RIGHT ANTECUBITAL  Final   Special Requests   Final    BOTTLES DRAWN AEROBIC AND ANAEROBIC Blood Culture adequate volume   Culture   Final    NO GROWTH 5 DAYS Performed at [facility], [address], [place], [place] [address]    Report Status 12/13/2016 FINAL  Final  Urine culture     Status: Abnormal   Collection Time: 12/08/16  5:08 PM  Result Value Ref Range Status   Specimen Description URINE, RANDOM  Final   Special Requests Immunocompromised  Final   Culture MULTIPLE SPECIES PRESENT,  SUGGEST RECOLLECTION (A)  Final   Report Status 12/10/2016 FINAL  Final  Blood Culture (routine x 2)     Status: Abnormal   Collection Time: 12/08/16  7:02 PM  Result  Value Ref Range Status   Specimen Description LEFT ANTECUBITAL  Final   Special Requests IN PEDIATRIC BOTTLE Blood Culture adequate volume  Final   Culture  Setup Time   Final    GRAM NEGATIVE RODS IN PEDIATRIC BOTTLE CRITICAL RESULT CALLED TO, READ BACK BY AND VERIFIED WITH: Christean Grief 10:30 12/09/16 ([email or web address]) Performed at [facility], [place] [address], [place], [place] [address]    Culture KLEBSIELLA PNEUMONIAE (A)  Final   Report Status 12/11/2016 FINAL  Final   Organism ID, Bacteria KLEBSIELLA PNEUMONIAE  Final      Susceptibility   Klebsiella pneumoniae - MIC*    AMPICILLIN RESISTANT Resistant     CEFAZOLIN <=4 SENSITIVE Sensitive     CEFEPIME <=1 SENSITIVE Sensitive     CEFTAZIDIME <=1 SENSITIVE Sensitive     CEFTRIAXONE <=1 SENSITIVE Sensitive     CIPROFLOXACIN <=0.25 SENSITIVE Sensitive     GENTAMICIN <=1 SENSITIVE Sensitive     IMIPENEM <=0.25 SENSITIVE Sensitive     TRIMETH/SULFA <=20 SENSITIVE Sensitive     AMPICILLIN/SULBACTAM 4 SENSITIVE Sensitive     PIP/TAZO <=4 SENSITIVE Sensitive     Extended ESBL NEGATIVE Sensitive     * KLEBSIELLA PNEUMONIAE  Blood Culture ID Panel (Reflexed)     Status: Abnormal   Collection Time: 12/08/16  7:02 PM  Result Value Ref Range Status   Enterococcus species NOT DETECTED NOT DETECTED Final   Listeria monocytogenes NOT DETECTED NOT DETECTED Final   Staphylococcus species NOT DETECTED NOT DETECTED Final   Staphylococcus aureus NOT DETECTED NOT DETECTED Final   Streptococcus species NOT DETECTED NOT DETECTED Final   Streptococcus agalactiae NOT DETECTED NOT DETECTED Final   Streptococcus pneumoniae NOT DETECTED NOT DETECTED Final   Streptococcus pyogenes NOT DETECTED NOT DETECTED Final   Acinetobacter baumannii NOT DETECTED NOT DETECTED Final   Enterobacteriaceae  species DETECTED (A) NOT DETECTED Final    Comment: Enterobacteriaceae represent a large family of gram-negative bacteria, not a single organism. CRITICAL RESULT CALLED TO, READ BACK BY AND VERIFIED WITH: 10:30 12/09/16 (wilsonm)    Enterobacter cloacae complex NOT DETECTED NOT DETECTED Final   Escherichia coli NOT DETECTED NOT DETECTED Final   Klebsiella oxytoca NOT DETECTED NOT DETECTED Final   Klebsiella pneumoniae DETECTED (A) NOT DETECTED Final    Comment: CRITICAL RESULT CALLED TO, READ BACK BY AND VERIFIED WITH: Mila Merry.D. 10:30 12/09/16 ([email or web address])    Proteus species NOT DETECTED NOT DETECTED Final   Serratia marcescens NOT DETECTED NOT DETECTED Final   Carbapenem resistance NOT DETECTED NOT DETECTED Final   Haemophilus influenzae NOT DETECTED NOT DETECTED Final   Neisseria meningitidis NOT DETECTED NOT DETECTED Final   Pseudomonas aeruginosa NOT DETECTED NOT DETECTED Final   Candida albicans NOT DETECTED NOT DETECTED Final   Candida glabrata NOT DETECTED NOT DETECTED Final   Candida krusei NOT DETECTED NOT DETECTED Final   Candida parapsilosis NOT DETECTED NOT DETECTED Final   Candida tropicalis NOT DETECTED NOT DETECTED Final    Comment: Performed at [facility], [address], [place], [place] [address]  Culture, blood (routine x 2)     Status: None   Collection Time: 12/10/16  5:39 AM  Result Value Ref Range Status   Specimen Description BLOOD RIGHT ARM  Final   Special Requests   Final    BOTTLES DRAWN AEROBIC AND ANAEROBIC Blood Culture adequate volume   Culture   Final    NO GROWTH 5 DAYS Performed at  [facility], [place] [address], [place], [name] [address]    Report Status 12/15/2016 FINAL  Final  Culture, blood (routine x 2)     Status: None   Collection Time: 12/10/16  5:46 AM  Result Value Ref Range Status   Specimen Description BLOOD LEFT ARM  Final   Special Requests   Final    BOTTLES DRAWN AEROBIC AND ANAEROBIC Blood Culture adequate  volume   Culture   Final    NO GROWTH 5 DAYS Performed at [facility], [place] [address], [place], [place] [address]    Report Status 12/15/2016 FINAL  Final  C difficile quick scan w PCR reflex     Status: None   Collection Time: 12/10/16  6:53 PM  Result Value Ref Range Status   C Diff antigen NEGATIVE NEGATIVE Final   C Diff toxin NEGATIVE NEGATIVE Final   C Diff interpretation No C. difficile detected.  Final     Labs: BNP (last 3 results) No results for input(s): BNP in the last 8760 hours. Basic Metabolic Panel:  Recent Labs Lab 12/14/16 0524 12/15/16 0825 12/16/16 0500 12/17/16 0502 12/18/16 0500  NA 134* 133* 133* 135 135  K 3.9 3.7 3.8 3.8 4.1  CL 101 97* 98* 101 101  CO2 _0 GLUCOSE 87 150* 96 136* 96  BUN _1 CREATININE 0.60* 0.62 0.64 0.75 0.86  CALCIUM 8.6* 8.8* 8.4* 8.2* 8.5*   Liver Function Tests:  Recent Labs Lab 12/14/16 0524 12/15/16 0825 12/16/16 0500 12/17/16 0502 12/18/16 0500  AST 68* 86* 84* 59* 52*  ALT 74* 82* 80* 66* 63  ALKPHOS 265* 346* 351* 322* 318*  BILITOT 10.0* 13.3* 14.1* 10.4* 6.5*  PROT 6.6 7.1 6.7 6.5 6.6  ALBUMIN 2.7* 2.9* 2.7* 2.5* 2.6*   No results for input(s): LIPASE, AMYLASE in the last 168 hours. No results for input(s): AMMONIA in the last 168 hours. CBC:  Recent Labs Lab 12/12/16 0605 12/13/16 0515 12/15/16 0825  WBC 2.0* 2.3* 4.5  HGB 10.0* 9.5* 10.3*  HCT 28.8* 27.6* 29.4*  MCV 90.9 91.1 90.7  PLT 155 167 300   Cardiac Enzymes: No results for input(s): CKTOTAL, CKMB, CKMBINDEX, TROPONINI in the last 168 hours. BNP: Invalid input(s): POCBNP CBG: No results for input(s): GLUCAP in the last 168 hours. D-Dimer No results for input(s): DDIMER in the last 72 hours. Hgb A1c No results for input(s): HGBA1C in the last 72 hours. Lipid Profile No results for input(s): CHOL, HDL, LDLCALC, TRIG, CHOLHDL, LDLDIRECT in the last 72 hours. Thyroid function studies No results for  input(s): TSH, T4TOTAL, T3FREE, THYROIDAB in the last 72 hours.  Invalid input(s): FREET3 Anemia work up No results for input(s): VITAMINB12, FOLATE, FERRITIN, TIBC, IRON, RETICCTPCT in the last 72 hours. Urinalysis    Component Value Date/Time   COLORURINE AMBER (A) 12/08/2016 1708   APPEARANCEUR HAZY (A) 12/08/2016 1708   LABSPEC 1.035 (H) 12/08/2016 1708   LABSPEC 1.020 03/18/2015 0847   PHURINE 5.0 12/08/2016 1708   GLUCOSEU NEGATIVE 12/08/2016 1708   GLUCOSEU Negative 03/18/2015 0847   HGBUR NEGATIVE 12/08/2016 1708   HGBUR small 04/01/2010 0848   BILIRUBINUR MODERATE (A) 12/08/2016 1708   BILIRUBINUR Negative 03/18/2015 0847   KETONESUR NEGATIVE 12/08/2016 1708   PROTEINUR 100 (A) 12/08/2016 1708   UROBILINOGEN 0.2 03/18/2015 0847   NITRITE NEGATIVE 12/08/2016 1708   LEUKOCYTESUR NEGATIVE 12/08/2016 1708   LEUKOCYTESUR Negative  03/18/2015 0847   Sepsis Labs Invalid input(s): PROCALCITONIN,  WBC,  LACTICIDVEN Microbiology Recent Results (from the past 240 hour(s))  Blood Culture (routine x 2)     Status: None   Collection Time: 12/08/16  5:08 PM  Result Value Ref Range Status   Specimen Description BLOOD RIGHT ANTECUBITAL  Final   Special Requests   Final    BOTTLES DRAWN AEROBIC AND ANAEROBIC Blood Culture adequate volume   Culture   Final    NO GROWTH 5 DAYS Performed at [facility], [address], [place], [place] [address]    Report Status 12/13/2016 FINAL  Final  Urine culture     Status: Abnormal   Collection Time: 12/08/16  5:08 PM  Result Value Ref Range Status   Specimen Description URINE, RANDOM  Final   Special Requests Immunocompromised  Final   Culture MULTIPLE SPECIES PRESENT, SUGGEST RECOLLECTION (A)  Final   Report Status 12/10/2016 FINAL  Final  Blood Culture (routine x 2)     Status: Abnormal   Collection Time: 12/08/16  7:02 PM  Result Value Ref Range Status   Specimen Description LEFT ANTECUBITAL  Final   Special Requests IN  PEDIATRIC BOTTLE Blood Culture adequate volume  Final   Culture  Setup Time   Final    GRAM NEGATIVE RODS IN PEDIATRIC BOTTLE CRITICAL RESULT CALLED TO, READ BACK BY AND VERIFIED WITH: Christean Grief 10:30 12/09/16 ([email or web address]) Performed at [facility], [place] [address], [place],  [address]    Culture KLEBSIELLA PNEUMONIAE (A)  Final   Report Status 12/11/2016 FINAL  Final   Organism ID, Bacteria KLEBSIELLA PNEUMONIAE  Final      Susceptibility   Klebsiella pneumoniae - MIC*    AMPICILLIN RESISTANT Resistant     CEFAZOLIN <=4 SENSITIVE Sensitive     CEFEPIME <=1 SENSITIVE Sensitive     CEFTAZIDIME <=1 SENSITIVE Sensitive     CEFTRIAXONE <=1 SENSITIVE Sensitive     CIPROFLOXACIN <=0.25 SENSITIVE Sensitive     GENTAMICIN <=1 SENSITIVE Sensitive     IMIPENEM <=0.25 SENSITIVE Sensitive     TRIMETH/SULFA <=20 SENSITIVE Sensitive     AMPICILLIN/SULBACTAM 4 SENSITIVE Sensitive     PIP/TAZO <=4 SENSITIVE Sensitive     Extended ESBL NEGATIVE Sensitive     * KLEBSIELLA PNEUMONIAE  Blood Culture ID Panel (Reflexed)     Status: Abnormal   Collection Time: 12/08/16  7:02 PM  Result Value Ref Range Status   Enterococcus species NOT DETECTED NOT DETECTED Final   Listeria monocytogenes NOT DETECTED NOT DETECTED Final   Staphylococcus species NOT DETECTED NOT DETECTED Final   Staphylococcus aureus NOT DETECTED NOT DETECTED Final   Streptococcus species NOT DETECTED NOT DETECTED Final   Streptococcus agalactiae NOT DETECTED NOT DETECTED Final   Streptococcus pneumoniae NOT DETECTED NOT DETECTED Final   Streptococcus pyogenes NOT DETECTED NOT DETECTED Final   Acinetobacter baumannii NOT DETECTED NOT DETECTED Final   Enterobacteriaceae species DETECTED (A) NOT DETECTED Final    Comment: Enterobacteriaceae represent a large family of gram-negative bacteria, not a single organism. CRITICAL RESULT CALLED TO, READ BACK BY AND VERIFIED WITH: 10:30 12/09/16 ([email or web address])    Enterobacter cloacae complex  NOT DETECTED NOT DETECTED Final   Escherichia coli NOT DETECTED NOT DETECTED Final   Klebsiella oxytoca NOT DETECTED NOT DETECTED Final   Klebsiella pneumoniae DETECTED (A) NOT DETECTED Final    Comment: CRITICAL RESULT CALLED TO, READ BACK BY AND VERIFIED WITH: Christean Grief  Pharm.D. 10:30 12/09/16 ([email or web address])    Proteus species NOT DETECTED NOT DETECTED Final   Serratia marcescens NOT DETECTED NOT DETECTED Final   Carbapenem resistance NOT DETECTED NOT DETECTED Final   Haemophilus influenzae NOT DETECTED NOT DETECTED Final   Neisseria meningitidis NOT DETECTED NOT DETECTED Final   Pseudomonas aeruginosa NOT DETECTED NOT DETECTED Final   Candida albicans NOT DETECTED NOT DETECTED Final   Candida glabrata NOT DETECTED NOT DETECTED Final   Candida krusei NOT DETECTED NOT DETECTED Final   Candida parapsilosis NOT DETECTED NOT DETECTED Final   Candida tropicalis NOT DETECTED NOT DETECTED Final    Comment: Performed at [facility], [place] [address], [place], [place] [address]  Culture, blood (routine x 2)     Status: None   Collection Time: 12/10/16  5:39 AM  Result Value Ref Range Status   Specimen Description BLOOD RIGHT ARM  Final   Special Requests   Final    BOTTLES DRAWN AEROBIC AND ANAEROBIC Blood Culture adequate volume   Culture   Final    NO GROWTH 5 DAYS Performed at [facility], [address], [place], [place] [address]    Report Status 12/15/2016 FINAL  Final  Culture, blood (routine x 2)     Status: None   Collection Time: 12/10/16  5:46 AM  Result Value Ref Range Status   Specimen Description BLOOD LEFT ARM  Final   Special Requests   Final    BOTTLES DRAWN AEROBIC AND ANAEROBIC Blood Culture adequate volume   Culture   Final    NO GROWTH 5 DAYS Performed at [facility], [place] [address] [place], [place] [address]    Report Status 12/15/2016 FINAL  Final  C difficile quick scan w PCR reflex     Status: None   Collection Time: 12/10/16  6:53 PM  Result  Value Ref Range Status   C Diff antigen NEGATIVE NEGATIVE Final   C Diff toxin NEGATIVE NEGATIVE Final   C Diff interpretation No C. difficile detected.  Final    Time coordinating discharge: 35 minutes  SIGNED:  Chipper Oman, MD  [facility] 12/18/2016, 4:37 PM  Pager please text page via  [email or web address] Password [email or web address]

## 2016-12-21 ENCOUNTER — Telehealth: Payer: Self-pay | Admitting: Gastroenterology

## 2016-12-21 ENCOUNTER — Other Ambulatory Visit: Payer: Self-pay

## 2016-12-21 ENCOUNTER — Telehealth: Payer: Self-pay

## 2016-12-21 ENCOUNTER — Other Ambulatory Visit (INDEPENDENT_AMBULATORY_CARE_PROVIDER_SITE_OTHER): Payer: 59

## 2016-12-21 DIAGNOSIS — K8043 Calculus of bile duct with acute cholecystitis with obstruction: Secondary | ICD-10-CM | POA: Diagnosis not present

## 2016-12-21 LAB — COMPREHENSIVE METABOLIC PANEL
ALBUMIN: 3.4 g/dL — AB (ref 3.5–5.2)
ALT: 101 U/L — ABNORMAL HIGH (ref 0–53)
AST: 123 U/L — AB (ref 0–37)
Alkaline Phosphatase: 495 U/L — ABNORMAL HIGH (ref 39–117)
BUN: 15 mg/dL (ref 6–23)
CHLORIDE: 98 meq/L (ref 96–112)
CO2: 26 meq/L (ref 19–32)
CREATININE: 1.36 mg/dL (ref 0.40–1.50)
Calcium: 8.9 mg/dL (ref 8.4–10.5)
GFR: 57.15 mL/min — ABNORMAL LOW (ref >60.00)
Glucose, Bld: 132 mg/dL — ABNORMAL HIGH (ref 70–99)
Potassium: 3.8 mEq/L (ref 3.5–5.1)
SODIUM: 133 meq/L — AB (ref 135–145)
Total Bilirubin: 6.8 mg/dL — ABNORMAL HIGH (ref 0.2–1.2)
Total Protein: 7.5 g/dL (ref 6.0–8.3)

## 2016-12-21 LAB — CBC WITH DIFFERENTIAL/PLATELET
BASOS PCT: 0.5 % (ref 0.0–3.0)
Basophils Absolute: 0 10*3/uL (ref 0.0–0.1)
EOS ABS: 0 10*3/uL (ref 0.0–0.7)
Eosinophils Relative: 0.1 % (ref 0.0–5.0)
HCT: 30.6 % — ABNORMAL LOW (ref 39.0–52.0)
HEMOGLOBIN: 10.6 g/dL — AB (ref 13.0–17.0)
LYMPHS ABS: 1.2 10*3/uL (ref 0.7–4.0)
Lymphocytes Relative: 11.8 % — ABNORMAL LOW (ref 12.0–46.0)
MCHC: 34.8 g/dL (ref 30.0–36.0)
MCV: 96.3 fl (ref 78.0–100.0)
MONO ABS: 0.9 10*3/uL (ref 0.1–1.0)
Monocytes Relative: 9.5 % (ref 3.0–12.0)
NEUTROS PCT: 78.1 % — AB (ref 43.0–77.0)
Neutro Abs: 7.6 10*3/uL (ref 1.4–7.7)
PLATELETS: 389 10*3/uL (ref 150.0–400.0)
RBC: 3.17 Mil/uL — ABNORMAL LOW (ref 4.22–5.81)
RDW: 18.8 % — AB (ref 11.5–15.5)
WBC: 9.8 10*3/uL (ref 4.0–10.5)

## 2016-12-21 MED ORDER — CIPROFLOXACIN HCL 500 MG PO TABS: 500.0000 mg | ORAL_TABLET | Freq: Two times a day (BID) | ORAL | 0 refills | Status: DC

## 2016-12-21 NOTE — Telephone Encounter (Signed)
Pt discharged a couple days ago following biliary stent replacement. Had fever to 103 about 0600 today which came down to 100.1 after Tylenol. Currently no other symptoms. Had a chill yesterday without a fever. Not discharged on antibiotics however Dr. Hilarie Fredrickson advised the patient to call for fever above 100.4. Will defer to Dr. Hilarie Fredrickson or Dr. Ardis Hughs regarding antibiotics, blood work or other plans today.

## 2016-12-21 NOTE — Telephone Encounter (Signed)
Pt aware of the instructions, med sent to pharmacy and lab order in [facility]

## 2016-12-21 NOTE — Telephone Encounter (Signed)
Called to complete TCM and schedule hospital f/u. VM not available.

## 2016-12-21 NOTE — Telephone Encounter (Signed)
Bryan Fields, thanks  [place], He needs to start augmentin

## 2016-12-21 NOTE — Telephone Encounter (Signed)
The pt has been advised and will start cipro today and have labs today.

## 2016-12-21 NOTE — Telephone Encounter (Signed)
Disregard the first note, it closed before I could complete it.  Patty, He needs to start cipro 500mg  po twice daily disp 14 with no refills (the klebsiella was sensitive to cipro).  He also needs cbc, cmet today.  If LFTs going back up, will need MRI with MRCP to get map for potential IR help here.

## 2016-12-22 ENCOUNTER — Other Ambulatory Visit: Payer: Self-pay

## 2016-12-22 DIAGNOSIS — K831 Obstruction of bile duct: Secondary | ICD-10-CM

## 2016-12-22 NOTE — Progress Notes (Signed)
You have been scheduled for an MRI at [facility] on 12/24/16. Your appointment time is 5 pm. Please arrive 15 minutes prior to your appointment time for registration purposes. Please make certain not to have anything to eat or drink 4 hours prior to your test. In addition, if you have any metal in your body, have a pacemaker or defibrillator, please be sure to let your ordering physician know. This test typically takes 45 minutes to 1 hour to complete. Should you need to reschedule, please call [phone] to do so.

## 2016-12-23 ENCOUNTER — Telehealth: Payer: Self-pay | Admitting: Gastroenterology

## 2016-12-23 NOTE — Telephone Encounter (Signed)
Pt scheduled for MRCP tomorrow at 5pm. He is calling wanting to know when he will have the results and what the next steps will be. He has an appt with Ned Card  NP at the cancer center on Friday and [place] He wants to make sure he has your ok to go out of town, it not he will stay closer to home. Please advise.

## 2016-12-23 NOTE — Telephone Encounter (Signed)
I tried calling him on both his numbers to discuss but there was not answer.  I think MRI will likely show that he still has dilated, obstructed bile duct(s) and that probably he will need to be considered for percutaneous drain.  I'll contact him after I see the report, will probably be Friday AM sometime.  He has metastatic cancer and is doing very poorly from it and so I think it would be absolutely fine if [place] wants [place], he just may acutely worsen and have to come back to [place] or go to local ER.

## 2016-12-23 NOTE — Telephone Encounter (Signed)
Patient returning phone call to Dr.Jacobs best call back number is [phone].

## 2016-12-24 ENCOUNTER — Other Ambulatory Visit: Payer: Self-pay | Admitting: Gastroenterology

## 2016-12-24 ENCOUNTER — Ambulatory Visit (HOSPITAL_COMMUNITY)
Admission: RE | Admit: 2016-12-24 | Discharge: 2016-12-24 | Disposition: A | Discharge status: Home / Self Care | Payer: 59 | Source: Ambulatory Visit | Attending: Gastroenterology | Admitting: Gastroenterology

## 2016-12-24 DIAGNOSIS — K831 Obstruction of bile duct: Secondary | ICD-10-CM

## 2016-12-24 DIAGNOSIS — R591 Generalized enlarged lymph nodes: Secondary | ICD-10-CM | POA: Diagnosis not present

## 2016-12-24 DIAGNOSIS — K8020 Calculus of gallbladder without cholecystitis without obstruction: Secondary | ICD-10-CM | POA: Insufficient documentation

## 2016-12-24 DIAGNOSIS — C189 Malignant neoplasm of colon, unspecified: Secondary | ICD-10-CM | POA: Insufficient documentation

## 2016-12-24 MED ORDER — GADOBENATE DIMEGLUMINE 529 MG/ML IV SOLN
20.0000 mL | Freq: Once | INTRAVENOUS | Status: AC | PRN
  Administered 2016-12-24: 20 mL via INTRAVENOUS

## 2016-12-24 NOTE — Telephone Encounter (Signed)
The pt is scheduled to have labs at 9:15 am at the cancer center before his appt

## 2016-12-24 NOTE — Telephone Encounter (Signed)
We spoke. Will await MRI from later today and repeat labs tomorrow AM to decide.  Patty, Can you call him.  I'd like him to have CBC, CMET in early AM tomorrow (before his oncology) appt to help decide on the plan.

## 2016-12-25 ENCOUNTER — Telehealth: Payer: Self-pay

## 2016-12-25 ENCOUNTER — Ambulatory Visit (HOSPITAL_BASED_OUTPATIENT_CLINIC_OR_DEPARTMENT_OTHER): Payer: 59 | Admitting: Nurse Practitioner

## 2016-12-25 ENCOUNTER — Other Ambulatory Visit (HOSPITAL_BASED_OUTPATIENT_CLINIC_OR_DEPARTMENT_OTHER): Payer: 59

## 2016-12-25 ENCOUNTER — Other Ambulatory Visit: Payer: Self-pay

## 2016-12-25 ENCOUNTER — Encounter: Payer: Self-pay | Admitting: Gastroenterology

## 2016-12-25 VITALS — BP 126/83 | HR 72 | Temp 98.9°F | Resp 18 | Ht 71.0 in | Wt 222.0 lb

## 2016-12-25 DIAGNOSIS — C787 Secondary malignant neoplasm of liver and intrahepatic bile duct: Secondary | ICD-10-CM | POA: Diagnosis not present

## 2016-12-25 DIAGNOSIS — C182 Malignant neoplasm of ascending colon: Secondary | ICD-10-CM

## 2016-12-25 DIAGNOSIS — C189 Malignant neoplasm of colon, unspecified: Secondary | ICD-10-CM

## 2016-12-25 DIAGNOSIS — C229 Malignant neoplasm of liver, not specified as primary or secondary: Secondary | ICD-10-CM

## 2016-12-25 DIAGNOSIS — K77 Liver disorders in diseases classified elsewhere: Secondary | ICD-10-CM

## 2016-12-25 DIAGNOSIS — K839 Disease of biliary tract, unspecified: Secondary | ICD-10-CM

## 2016-12-25 LAB — CBC WITH DIFFERENTIAL/PLATELET
BASO%: 0.9 % (ref 0.0–2.0)
Basophils Absolute: 0 10*3/uL (ref 0.0–0.1)
EOS ABS: 0 10*3/uL (ref 0.0–0.5)
EOS%: 0.4 % (ref 0.0–7.0)
HEMATOCRIT: 31.8 % — AB (ref 38.4–49.9)
HGB: 10.6 g/dL — ABNORMAL LOW (ref 13.0–17.1)
LYMPH#: 0.9 10*3/uL (ref 0.9–3.3)
LYMPH%: 26.4 % (ref 14.0–49.0)
MCH: 33.2 pg (ref 27.2–33.4)
MCHC: 33.5 g/dL (ref 32.0–36.0)
MCV: 99.3 fL — AB (ref 79.3–98.0)
MONO#: 0.4 10*3/uL (ref 0.1–0.9)
MONO%: 10.7 % (ref 0.0–14.0)
NEUT%: 61.6 % (ref 39.0–75.0)
NEUTROS ABS: 2.1 10*3/uL (ref 1.5–6.5)
PLATELETS: 278 10*3/uL (ref 140–400)
RBC: 3.2 10*6/uL — ABNORMAL LOW (ref 4.20–5.82)
RDW: 19.7 % — ABNORMAL HIGH (ref 11.0–14.6)
WBC: 3.4 10*3/uL — AB (ref 4.0–10.3)

## 2016-12-25 LAB — COMPREHENSIVE METABOLIC PANEL
ALK PHOS: 396 U/L — AB (ref 40–150)
ALT: 92 U/L — AB (ref 0–55)
ANION GAP: 9 meq/L (ref 3–11)
AST: 84 U/L — ABNORMAL HIGH (ref 5–34)
Albumin: 2.9 g/dL — ABNORMAL LOW (ref 3.5–5.0)
BILIRUBIN TOTAL: 3.45 mg/dL — AB (ref 0.20–1.20)
BUN: 12.9 mg/dL (ref 7.0–26.0)
CALCIUM: 9.4 mg/dL (ref 8.4–10.4)
CO2: 25 mEq/L (ref 22–29)
CREATININE: 1.2 mg/dL (ref 0.7–1.3)
Chloride: 103 mEq/L (ref 98–109)
EGFR: 69 mL/min/{1.73_m2} — AB (ref >90)
Glucose: 110 mg/dl (ref 70–140)
Potassium: 4 mEq/L (ref 3.5–5.1)
Sodium: 137 mEq/L (ref 136–145)
TOTAL PROTEIN: 7.6 g/dL (ref 6.4–8.3)

## 2016-12-25 MED ORDER — CIPROFLOXACIN HCL 500 MG PO TABS: 500.0000 mg | ORAL_TABLET | Freq: Two times a day (BID) | ORAL | 0 refills | Status: AC

## 2016-12-25 MED ORDER — METOCLOPRAMIDE HCL 10 MG PO TABS: 10.0000 mg | ORAL_TABLET | Freq: Three times a day (TID) | ORAL | 0 refills | Status: DC

## 2016-12-25 NOTE — Telephone Encounter (Signed)
Patty, Thanks for all of that.  I spoke with Mr. Signor this AM.  His bilirubin is not normalizing but fortunately he has not had any more fevers since restarting cipro.  I think he is probably incompletely decompressed from a biliary standpoint and that if he stops antibiotics he will get bacteremic, septic again.  He is leaving for the [place] tomorrow for a week long family vacation that he is really looking forward to.   He is going to stay on cipro twice daily until procedures at [facility] on Monday July 30th.  That day he'll have ERCP with me to remove the existing plastic biliary stent (that is within a permanent metal stent) to get it out of the way so that afterwards IR can place a (likely right sided) percutaneous int/ext biliary drain through the metal stent.  Mr. Loughney knows that if he starts to feel poorly while at the beach (especially fevers, chills, abd pains) he needs to get back to [place] (ideally) or go to local ER if he can't make it back safely.    I discussed this with Dr. Laurence Ferrari from IR, we agree on the plan above.  He will not be covering IR at [facility] on that Monday but will get word to that MD.  I greatly appreciate their help as always.    Heath, Thanks again.  Radonna Ricker

## 2016-12-25 NOTE — Telephone Encounter (Signed)
Verbal orders from Dr Jacobs,  Pt is to have Cipro 500 mg BID x10 days  ERCP on 01/04/17 930 am [facility] with Dr Jacobs After ERCP pt is to have PTC drain placement with IR (Dr Jacobs has spoken with Dr McCollough)  The pt will be admitted after testing.   The pt has been advised and will have ERCP on Monday, he was instructed and will be added to My Chart.  He is aware of the PTC drain after I spoke with Tiffany at IR she is aware and has the pt scheduled.  

## 2016-12-25 NOTE — Progress Notes (Signed)
[place] OFFICE PROGRESS NOTE   Diagnosis:  Colon cancer  INTERVAL HISTORY:   Mr. Lover returns as scheduled. He completed cycle 1 Lonsurf beginning 11/17/2016. Subsequent cycle on hold due to biliary obstruction. Main complaint is a poor energy level. No fever or chills. He continues ciprofloxacin. He has occasional mild nausea. He notes the nausea is much better controlled since increasing the Reglan from once daily to 3 times daily before meals. No diarrhea.  Objective:  Vital signs in last 24 hours:  Blood pressure 126/83, pulse 72, temperature 98.9 F (37.2 C), temperature source Oral, resp. rate 18, height '5\' 11"'$  (1.803 m), weight 222 lb (100.7 kg), SpO2 99 %.   He is fatigued appearing. He does not appear acutely ill. HEENT: Mild scleral icterus. No thrush or ulcers. Resp: Lungs clear bilaterally. Cardio: Regular rate and rhythm. GI: Tender with associated fullness (question hepatomegaly) right upper abdomen. Vascular: No leg edema. Neuro: Alert and oriented.  Port-A-Cath without erythema.    Lab Results:  Lab Results  Component Value Date   WBC 3.4 (L) 12/25/2016   HGB 10.6 (L) 12/25/2016   HCT 31.8 (L) 12/25/2016   MCV 99.3 (H) 12/25/2016   PLT 278 12/25/2016   NEUTROABS 2.1 12/25/2016    Imaging:  Mr 3d Recon At Scanner  Result Date: 12/25/2016 CLINICAL DATA:  56 year old male with signs and symptoms of biliary tract obstruction. Metastatic colon cancer. EXAM: MRI ABDOMEN WITHOUT AND WITH CONTRAST (INCLUDING MRCP) TECHNIQUE: Multiplanar multisequence MR imaging of the abdomen was performed both before and after the administration of intravenous contrast. Heavily T2-weighted images of the biliary and pancreatic ducts were obtained, and three-dimensional MRCP images were rendered by post processing. CONTRAST:  21m MULTIHANCE GADOBENATE DIMEGLUMINE 529 MG/ML IV SOLN COMPARISON:  MRI of the abdomen 11/20/2013. CT the abdomen and pelvis  12/14/2016. FINDINGS: Lower chest: Visualized portions are unremarkable. Hepatobiliary: 3 hypovascular hepatic lesions are noted: 2.3 cm lesion in segment 8 (axial image 14 of series 1401), 1.9 cm lesion in segment 7 (axial image 31 of series 1401), and 8 mm lesion in segment 6 (axial image 74 of series 1401). MRCP images demonstrate mild to moderate intrahepatic biliary ductal dilatation. There is a stent in the common bile duct which limits evaluation of this portion of a biliary tree. The stent does appear grossly patent. Distal common bile duct beyond the stent is patent measuring 4 mm. No filling defect in this portion of the common bile duct to suggest choledocholithiasis at this time. Small filling defects are noted within the gallbladder, compatible with cholelithiasis. Gallbladder is moderately distended, but there is no gallbladder wall thickening. Trace amount of pericholecystic fluid and fluid adjacent to the second and third portions of the duodenum. Pancreas: No pancreatic mass. No pancreatic ductal dilatation on MRCP images. Trace amount of fluid adjacent to the inferior aspect of the pancreatic head and adjacent duodenum. Spleen: Spleen is mildly enlarged measuring 13.4 x 7.7 x 15.3 cm (estimated splenic volume of 789 mL). Adrenals/Urinary Tract: Bilateral kidneys and bilateral adrenal glands are normal in appearance. No hydroureteronephrosis in the visualized portions of the abdomen. Stomach/Bowel: Small amount of fluid adjacent to the second and third portions of the duodenum. Otherwise, visualized portions of stomach, small bowel and colon are unremarkable. Vascular/Lymphatic: No aneurysm identified in the visualized abdominal vasculature. Soft tissue mass is in the region of the porta hepatis likely reflects lymphadenopathy. The largest of these measures 5.2 x 3.5 x 4.4 cm (axial image  44 of series 1405n coronal image 67 of series 15) medial to the proximal common bile duct in the hepatoduodenal  ligament. An additional lesion posterior to the common bile duct in the portacaval space measures 2.5 x 4.5 x 6.4 cm (axial image 54 of series 1405 and coronal image 57 of series 15). An additional lesion adjacent to the left lobe of the liver may represent gastrohepatic ligament lymphadenopathy or a serosal implant upon the surface of the liver measuring 4.1 x 3.6 x 4.5 cm (axial image 30 of series 1405 and coronal image 77 of series 15). Other: Multiple peritoneal implants are noted in the upper abdomen, the largest of which include a 3.6 x 3.0 x 2.8 cm lesion in the left upper quadrant which is intimately associated with some loops of mid small bowel (axial image 54 of series 1403 and coronal image 93 of series 15), and a lesion that is slightly more cephalad in location intimately associated with the undersurface of the left hemidiaphragm (axial image 35 of series 14 of 3 and coronal image 89 of series 15) measuring 2.8 x 2.2 x 2.1 cm. Trace volume of fluid adjacent to the second and third portions of the duodenum. No larger volume of ascites. Musculoskeletal: Multiple heterogeneously enhancing lesions are again noted in the right side of the anterior abdominal wall musculature, the largest of which measures 2.7 x 4.3 x 4.5 cm (axial image 43 of series 1403 and coronal image 111 of series 15). No aggressive appearing osseous lesions are noted in the visualized portions of the skeleton. IMPRESSION: 1. MRCP images are limited by the presence of the common bile duct stent. However, the stent appears grossly patent. There is mild to moderate intrahepatic biliary ductal dilatation. No definite choledocholithiasis within the well evaluated portions of the common bile duct. There is cholelithiasis. Gallbladder is moderately distended, and there is a trace amount of pericholecystic fluid as well as fluid adjacent to the second and third portions of the duodenum. However, there is no gallbladder wall thickening or other  overt findings to strongly suggest an acute cholecystitis at this time. 2. Widespread metastatic disease noted throughout the visualized abdomen, including 3 intrahepatic metastases, extensive lymphadenopathy in the region of the hepatoduodenal ligament and gastrohepatic ligament, multiple peritoneal implants in the upper abdomen, and soft tissue metastases in the upper anterior abdominal wall musculature and overlying subcutaneous fat on the right side, as described above. Electronically Signed   By: Vinnie Langton M.D.   On: 12/25/2016 08:24   Mr Abdomen Mrcp Moise Boring Contast  Result Date: 12/25/2016 CLINICAL DATA:  56 year old male with signs and symptoms of biliary tract obstruction. Metastatic colon cancer. EXAM: MRI ABDOMEN WITHOUT AND WITH CONTRAST (INCLUDING MRCP) TECHNIQUE: Multiplanar multisequence MR imaging of the abdomen was performed both before and after the administration of intravenous contrast. Heavily T2-weighted images of the biliary and pancreatic ducts were obtained, and three-dimensional MRCP images were rendered by post processing. CONTRAST:  68m MULTIHANCE GADOBENATE DIMEGLUMINE 529 MG/ML IV SOLN COMPARISON:  MRI of the abdomen 11/20/2013. CT the abdomen and pelvis 12/14/2016. FINDINGS: Lower chest: Visualized portions are unremarkable. Hepatobiliary: 3 hypovascular hepatic lesions are noted: 2.3 cm lesion in segment 8 (axial image 14 of series 1401), 1.9 cm lesion in segment 7 (axial image 31 of series 1401), and 8 mm lesion in segment 6 (axial image 74 of series 1401). MRCP images demonstrate mild to moderate intrahepatic biliary ductal dilatation. There is a stent in the common bile duct  which limits evaluation of this portion of a biliary tree. The stent does appear grossly patent. Distal common bile duct beyond the stent is patent measuring 4 mm. No filling defect in this portion of the common bile duct to suggest choledocholithiasis at this time. Small filling defects are noted  within the gallbladder, compatible with cholelithiasis. Gallbladder is moderately distended, but there is no gallbladder wall thickening. Trace amount of pericholecystic fluid and fluid adjacent to the second and third portions of the duodenum. Pancreas: No pancreatic mass. No pancreatic ductal dilatation on MRCP images. Trace amount of fluid adjacent to the inferior aspect of the pancreatic head and adjacent duodenum. Spleen: Spleen is mildly enlarged measuring 13.4 x 7.7 x 15.3 cm (estimated splenic volume of 789 mL). Adrenals/Urinary Tract: Bilateral kidneys and bilateral adrenal glands are normal in appearance. No hydroureteronephrosis in the visualized portions of the abdomen. Stomach/Bowel: Small amount of fluid adjacent to the second and third portions of the duodenum. Otherwise, visualized portions of stomach, small bowel and colon are unremarkable. Vascular/Lymphatic: No aneurysm identified in the visualized abdominal vasculature. Soft tissue mass is in the region of the porta hepatis likely reflects lymphadenopathy. The largest of these measures 5.2 x 3.5 x 4.4 cm (axial image 44 of series 1405n coronal image 67 of series 15) medial to the proximal common bile duct in the hepatoduodenal ligament. An additional lesion posterior to the common bile duct in the portacaval space measures 2.5 x 4.5 x 6.4 cm (axial image 54 of series 1405 and coronal image 57 of series 15). An additional lesion adjacent to the left lobe of the liver may represent gastrohepatic ligament lymphadenopathy or a serosal implant upon the surface of the liver measuring 4.1 x 3.6 x 4.5 cm (axial image 30 of series 1405 and coronal image 77 of series 15). Other: Multiple peritoneal implants are noted in the upper abdomen, the largest of which include a 3.6 x 3.0 x 2.8 cm lesion in the left upper quadrant which is intimately associated with some loops of mid small bowel (axial image 54 of series 1403 and coronal image 93 of series 15), and  a lesion that is slightly more cephalad in location intimately associated with the undersurface of the left hemidiaphragm (axial image 35 of series 14 of 3 and coronal image 89 of series 15) measuring 2.8 x 2.2 x 2.1 cm. Trace volume of fluid adjacent to the second and third portions of the duodenum. No larger volume of ascites. Musculoskeletal: Multiple heterogeneously enhancing lesions are again noted in the right side of the anterior abdominal wall musculature, the largest of which measures 2.7 x 4.3 x 4.5 cm (axial image 43 of series 1403 and coronal image 111 of series 15). No aggressive appearing osseous lesions are noted in the visualized portions of the skeleton. IMPRESSION: 1. MRCP images are limited by the presence of the common bile duct stent. However, the stent appears grossly patent. There is mild to moderate intrahepatic biliary ductal dilatation. No definite choledocholithiasis within the well evaluated portions of the common bile duct. There is cholelithiasis. Gallbladder is moderately distended, and there is a trace amount of pericholecystic fluid as well as fluid adjacent to the second and third portions of the duodenum. However, there is no gallbladder wall thickening or other overt findings to strongly suggest an acute cholecystitis at this time. 2. Widespread metastatic disease noted throughout the visualized abdomen, including 3 intrahepatic metastases, extensive lymphadenopathy in the region of the hepatoduodenal ligament and gastrohepatic ligament,  multiple peritoneal implants in the upper abdomen, and soft tissue metastases in the upper anterior abdominal wall musculature and overlying subcutaneous fat on the right side, as described above. Electronically Signed   By: Vinnie Langton M.D.   On: 12/25/2016 08:24    Medications: I have reviewed the patient's current medications.  Assessment/Plan: 1. 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 Microsatellite stable; preserved expression of major and minor MMR proteins. He began treatment with FOLFOX in December 2011. He completed cycle #12 on 10/27/2010. Oxaliplatin was held with cycles number 7 through 10 and resumed with cycle #11. 1. History of neutropenia secondary to chemotherapy. 2. History of thrombocytopenia secondary chemotherapy. 3. He did not undergo a preoperative colonoscopy. He underwent a colonoscopy on 12/19/2010 with findings of a 1 cm polyp at 90 cm from the anal verge, minimal polyp at 30 cm from the anal verge and minimal diverticulosis. Colonoscopy 03/18/2012-negative. 4. 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 He began study drug on 05/13/2010. 5. Status post Port-A-Cath placement. The Port-A-Cath has been removed. 6. 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 8. Low attenuation lesion in the caudate lobe of the liver on the CT 04/11/2012-? Cyst. MRI liver on 04/20/2012 favored the caudate lobe liver lesion to represent a cyst or complex cyst. CT abdomen/pelvis 04/17/2013 with continued enlargement of hypovascular lesion in the caudate lobe of the liver.  PET scan 10/27/2011 confirmed a hypermetabolic caudate lobe liver lesion, tiny indeterminate lung nodules, and a 9 mm level II left neck node   CT biopsy of the liver lesion 11/07/2013 confirmed adenocarcinoma  Left liver and caudate resection 01/15/2014-pathology confirmed metastatic colon cancer, and negative surgical margins  Surveillance CT scans 07/24/2014 consistent with recurrent disease involving upper abdominal lymph nodes, peritoneal implants, and an anterior abdominal wall mass  Status post biopsy of the anterior abdominal wall mass 08/06/2014 with pathology showing  metastatic adenocarcinoma consistent with a colon primary.  [id number] 1/28  Cycle 1 FOLFIRI/Avastin on the [facility] 1317 08/20/2014  Cycle 2 FOLFIRI/Avastin 09/03/2014  Cycle 3 held on 09/17/2014 due to neutropenia  Cycle 3 FOLFIRI/Avastin 09/25/2014. Neulasta added beginning with cycle 3.  Cycle 4 FOLFIRI/Avastin 10/08/2014  Restaging CT scans 10/18/2014 with slight interval increase in the size of the soft tissue mass in the subcutaneous fat of the epigastric region. Underlying peritoneal implants, probable focus of residual disease along the resected margin of the liver and hepatoduodenal ligament lymphadenopathy all appeared slightly improved. No new sites of metastatic disease otherwise noted. New left lower lobe bronchopneumonia.  Cycle 5 FOLFIRI/Avastin 10/22/2014  Cycle 6 FOLFIRI/Avastin 11/06/2014  Cycle 7 FOLFIRI Avastin 11/19/2014  Cycle 8 FOLFIRI/Avastin 12/03/2014  CT 12/14/2014 with stable disease  Cycle 9 FOLFIRI/Avastin 12/17/2014  Cycle 10 FOLFIRI/Avastin 01/07/2015 (5-fluorouracil and leucovorin dose reduced)  Cycle 11 FOLFIRI/Avastin 01/21/2015  Cycle 12 FOLFIRI/Avastin 02/04/2015  CT 02/13/2015 with stable disease  Cycle 13 FOLFIRI/Avastin 02/25/2015-changed to an every three-week protocol, now off study  Avastin held with cycle 14 FOLFIRI on 03/25/2015 secondary to gingivitis  Restaging CTs 05/20/2015-slight increase in the size of the epigastric subcutaneous mass, stable peritoneal implants  FOLFIRI continued on a 3 week schedule  Avastin resumed 06/17/2015  CTs 12/23/2015-stable subxiphoid mass, stable low-attenuation mass adjacent to the left liver surgical margin, peritoneal  lesions-stable  FOLFIRI continued on a 4 week schedule.  Avastin placed on hold 01/06/2016  FOLFIRI discontinued 03/02/2016 secondary to enlargement of the abdominal wall mass with new superficial fungating nodules involving the skin  Palliative radiation to the abdominal  wall mass completed 03/20/2016  Cycle 1 FOLFOX12/27/2017  Cycle 2 CAPOX 06/22/2016  Clinical progression 07/07/2016 and allergic reaction to oxaliplatin 06/23/2015, CAPOX discontinued  Resection of the main abdominal wall mass and an adjacent peritoneal mass on 07/23/2016 with the pathology confirming metastatic colon cancer  [facility]aging CTs at [facility] 09/18/2016-new liver metastasis, progressive soft tissue masses at the liver resection margin, new mass adjacent to the chest wall resection site, increase in size of peritoneal nodules  CT abdomen/pelvis 10/02/2016-interval progression of periportal adenopathy and recurrence along the hepatic resection margin. New hepatic program lesion superior right hepatic lobe. Interval increase in size of peritoneal implants left upper quadrant. Enlargement of anabdominal wall mass along the right rectus muscle.  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 This stricture was stented with a 6cm long, uncovered 69m diameter metal bile duct stent in good position.   Initiation of Lonsurf cycle 1 11/17/2016  MRI abdomen 12/24/2016-mild to moderate intrahepatic biliary ductal dilatation; stent in the common bile duct appears grossly patent; 3 intrahepatic metastases, extensive lymphadenopathy hepatoduodenal ligament and gastrohepatic ligament, multiple peritoneal implants in the upper abdomen and soft tissue metastases in the upper anterior abdominal wall musculature and overlying subcutaneous fat on the right side. 10. Iron deposition within the liver noted on MRI 04/20/2012. The ferritin level was normal on 04/17/2013. Increased iron deposition noted on the left liver resection 01/15/2014 11. Pain/bleeding at the anus reported when here 04/24/2013. Status post evaluation by Dr. NLucia Gaskinswith findings of an anal fissure. 12. Right face cystic lesion 13. Status post Port-A-Cath  placement 08/14/2014 14. Delayed nausea following FOLFIRI, prophylactic Decadron added with cycle 2 with improvement. 15. Chest CT 10/18/2014 with new left lower lobe bronchopneumonia. Levaquin initiated. Resolved on CT 12/14/2014 16. Gum pain-mucositis?, Gingivitis?-Improved with discontinuation of Avastin 17. Pain/tenderness, mild erythema and full appearance over the maxillary sinuses. Maxillofacial CT 01/30/2015 with mild to moderate bilateral maxillary and sphenoid sinus inflammatory changes. Multifocal right maxillary dental periapical lucency. Status post a right upper tooth extraction with resolution of the pain. 18. Admission 10/21/2016 with nausea/vomiting-x-ray evidence for a partial small bowel obstruction, clinical improvement with bowel rest, recurrent obstructive symptoms during the week of 11/30/2016                                                                                        19. Admission 12/08/2016 with a high fever-Klebsiella bacteremia  confirmed, likely a biliary source               20. Mild neutropenia and anemia/thrombocytopenia secondary to chemotherapy-the platelet count and white count have recovered during this hospital admission  21.  Biliary obstruction-CT 12/14/2016 revealed intrahepatic biliary dilatation despite a common bile duct stent distended gallbladder  Nuclear medicine biliary scan 12/16/2016-poor hepatic uptake with nonvisualization of the bile ducts consistent with biliary obstruction  ERCP 12/16/2016 confirmed intrahepatic biliary dilatation, status post placement of a new bile duct stent  MRI abdomen 12/24/2016-mild to moderate intrahepatic biliary ductal dilatation; stent in the common bile duct appears grossly patent; 3 intrahepatic metastases, extensive lymphadenopathy hepatoduodenal ligament and gastrohepatic ligament, multiple peritoneal implants in the  upper abdomen and soft tissue metastases in the upper anterior abdominal wall musculature and overlying subcutaneous fat on the right side.   Disposition: Mr. Medlen appears stable. The MRI from yesterday shows mild to moderate intrahepatic biliary ductal dilatation. Chemistry panel from today is pending. We will forward the results to Dr. Ardis Hughs as soon as possible. Mr. Fahs understands Frankey Poot will remain on hold until the bilirubin normalizes.  He plans to leave town for a vacation at [employer] later today. He and his wife understand to proceed to the closest emergency department with fever, chills or development of any other worrisome symptoms while he is out of town. They will make a final decision on whether or not to go pending Dr. Ardis Hughs review of today's chemistry panel.  He is scheduled to return for a follow-up visit here 01/08/2017. We will see him prior to that with any problems.      Ned Card ANP/GNP-BC   12/25/2016  9:56 AM

## 2016-12-25 NOTE — Telephone Encounter (Signed)
Verbal orders from Dr Ardis Hughs,  Pt is to have Cipro 500 mg BID x10 days  ERCP on 01/04/17 930 am [facility] with Dr Ardis Hughs After ERCP pt is to have PTC drain placement with IR (Dr Ardis Hughs has spoken with Dr Geroge Baseman)  The pt will be admitted after testing.   The pt has been advised and will have ERCP on Monday, he was instructed and will be added to My Chart.  He is aware of the PTC drain after I spoke with Tiffany at IR she is aware and has the pt scheduled.

## 2016-12-25 NOTE — Telephone Encounter (Signed)
Wife called stating a Rx today was messed up and please call. When called back she stated it was all right.

## 2017-01-01 ENCOUNTER — Other Ambulatory Visit: Payer: Self-pay | Admitting: Radiology

## 2017-01-01 ENCOUNTER — Other Ambulatory Visit: Payer: Self-pay | Admitting: General Surgery

## 2017-01-04 ENCOUNTER — Encounter (HOSPITAL_COMMUNITY): Payer: Self-pay

## 2017-01-04 ENCOUNTER — Encounter (HOSPITAL_COMMUNITY)
Admission: AD | Disposition: A | Discharge status: Home / Self Care | Payer: Self-pay | Source: Ambulatory Visit | Attending: Internal Medicine

## 2017-01-04 ENCOUNTER — Ambulatory Visit (HOSPITAL_COMMUNITY)
Admission: RE | Admit: 2017-01-04 | Discharge: 2017-01-04 | Disposition: A | Discharge status: Home / Self Care | Payer: 59 | Source: Ambulatory Visit | Attending: Gastroenterology | Admitting: Gastroenterology

## 2017-01-04 ENCOUNTER — Inpatient Hospital Stay (HOSPITAL_COMMUNITY)
Admission: AD | Admit: 2017-01-04 | Discharge: 2017-01-12 | DRG: 435 | Disposition: A | Discharge status: Home / Self Care | Payer: 59 | Source: Ambulatory Visit | Attending: Internal Medicine | Admitting: Internal Medicine

## 2017-01-04 ENCOUNTER — Ambulatory Visit (HOSPITAL_COMMUNITY): Payer: 59 | Admitting: Certified Registered Nurse Anesthetist

## 2017-01-04 ENCOUNTER — Ambulatory Visit (HOSPITAL_COMMUNITY): Payer: 59

## 2017-01-04 DIAGNOSIS — Z9221 Personal history of antineoplastic chemotherapy: Secondary | ICD-10-CM

## 2017-01-04 DIAGNOSIS — I8500 Esophageal varices without bleeding: Secondary | ICD-10-CM | POA: Diagnosis present

## 2017-01-04 DIAGNOSIS — Z79891 Long term (current) use of opiate analgesic: Secondary | ICD-10-CM

## 2017-01-04 DIAGNOSIS — D61810 Antineoplastic chemotherapy induced pancytopenia: Secondary | ICD-10-CM | POA: Diagnosis present

## 2017-01-04 DIAGNOSIS — I10 Essential (primary) hypertension: Secondary | ICD-10-CM | POA: Diagnosis not present

## 2017-01-04 DIAGNOSIS — C189 Malignant neoplasm of colon, unspecified: Secondary | ICD-10-CM | POA: Diagnosis present

## 2017-01-04 DIAGNOSIS — K839 Disease of biliary tract, unspecified: Secondary | ICD-10-CM

## 2017-01-04 DIAGNOSIS — C7989 Secondary malignant neoplasm of other specified sites: Secondary | ICD-10-CM | POA: Diagnosis present

## 2017-01-04 DIAGNOSIS — D6959 Other secondary thrombocytopenia: Secondary | ICD-10-CM | POA: Diagnosis present

## 2017-01-04 DIAGNOSIS — K831 Obstruction of bile duct: Secondary | ICD-10-CM | POA: Diagnosis present

## 2017-01-04 DIAGNOSIS — C787 Secondary malignant neoplasm of liver and intrahepatic bile duct: Principal | ICD-10-CM | POA: Diagnosis present

## 2017-01-04 DIAGNOSIS — C229 Malignant neoplasm of liver, not specified as primary or secondary: Secondary | ICD-10-CM | POA: Diagnosis present

## 2017-01-04 DIAGNOSIS — R591 Generalized enlarged lymph nodes: Secondary | ICD-10-CM | POA: Diagnosis present

## 2017-01-04 DIAGNOSIS — T859XXA Unspecified complication of internal prosthetic device, implant and graft, initial encounter: Secondary | ICD-10-CM | POA: Diagnosis present

## 2017-01-04 DIAGNOSIS — T859XXD Unspecified complication of internal prosthetic device, implant and graft, subsequent encounter: Secondary | ICD-10-CM | POA: Diagnosis not present

## 2017-01-04 DIAGNOSIS — K3189 Other diseases of stomach and duodenum: Secondary | ICD-10-CM | POA: Diagnosis present

## 2017-01-04 DIAGNOSIS — R5082 Postprocedural fever: Secondary | ICD-10-CM | POA: Diagnosis not present

## 2017-01-04 DIAGNOSIS — Z4659 Encounter for fitting and adjustment of other gastrointestinal appliance and device: Secondary | ICD-10-CM | POA: Diagnosis not present

## 2017-01-04 DIAGNOSIS — R000 Tachycardia, unspecified: Secondary | ICD-10-CM | POA: Diagnosis not present

## 2017-01-04 DIAGNOSIS — Z86010 Personal history of colonic polyps: Secondary | ICD-10-CM

## 2017-01-04 DIAGNOSIS — Z888 Allergy status to other drugs, medicaments and biological substances status: Secondary | ICD-10-CM

## 2017-01-04 DIAGNOSIS — T451X5A Adverse effect of antineoplastic and immunosuppressive drugs, initial encounter: Secondary | ICD-10-CM | POA: Diagnosis present

## 2017-01-04 DIAGNOSIS — G620 Drug-induced polyneuropathy: Secondary | ICD-10-CM | POA: Diagnosis present

## 2017-01-04 DIAGNOSIS — R1011 Right upper quadrant pain: Secondary | ICD-10-CM

## 2017-01-04 DIAGNOSIS — B961 Klebsiella pneumoniae [K. pneumoniae] as the cause of diseases classified elsewhere: Secondary | ICD-10-CM | POA: Diagnosis present

## 2017-01-04 DIAGNOSIS — A4159 Other Gram-negative sepsis: Secondary | ICD-10-CM | POA: Diagnosis present

## 2017-01-04 DIAGNOSIS — Z89431 Acquired absence of right foot: Secondary | ICD-10-CM

## 2017-01-04 DIAGNOSIS — Z923 Personal history of irradiation: Secondary | ICD-10-CM

## 2017-01-04 DIAGNOSIS — R188 Other ascites: Secondary | ICD-10-CM | POA: Diagnosis present

## 2017-01-04 DIAGNOSIS — Z87891 Personal history of nicotine dependence: Secondary | ICD-10-CM

## 2017-01-04 DIAGNOSIS — R109 Unspecified abdominal pain: Secondary | ICD-10-CM

## 2017-01-04 DIAGNOSIS — K219 Gastro-esophageal reflux disease without esophagitis: Secondary | ICD-10-CM | POA: Diagnosis present

## 2017-01-04 DIAGNOSIS — Z79899 Other long term (current) drug therapy: Secondary | ICD-10-CM

## 2017-01-04 HISTORY — PX: ENDOSCOPIC RETROGRADE CHOLANGIOPANCREATOGRAPHY (ERCP) WITH PROPOFOL: SHX5810

## 2017-01-04 HISTORY — PX: IR INT EXT BILIARY DRAIN WITH CHOLANGIOGRAM: IMG6044

## 2017-01-04 LAB — CBC WITH DIFFERENTIAL/PLATELET
BASOS ABS: 0 10*3/uL (ref 0.0–0.1)
Basophils Relative: 1 %
Eosinophils Absolute: 0.1 10*3/uL (ref 0.0–0.7)
Eosinophils Relative: 4 %
HCT: 34.2 % — ABNORMAL LOW (ref 39.0–52.0)
HEMOGLOBIN: 11.5 g/dL — AB (ref 13.0–17.0)
LYMPHS PCT: 20 %
Lymphs Abs: 0.8 10*3/uL (ref 0.7–4.0)
MCH: 32.7 pg (ref 26.0–34.0)
MCHC: 33.6 g/dL (ref 30.0–36.0)
MCV: 97.2 fL (ref 78.0–100.0)
Monocytes Absolute: 0.4 10*3/uL (ref 0.1–1.0)
Monocytes Relative: 10 %
NEUTROS ABS: 2.5 10*3/uL (ref 1.7–7.7)
Neutrophils Relative %: 65 %
Platelets: 145 10*3/uL — ABNORMAL LOW (ref 150–400)
RBC: 3.52 MIL/uL — AB (ref 4.22–5.81)
RDW: 18.1 % — ABNORMAL HIGH (ref 11.5–15.5)
WBC: 3.7 10*3/uL — AB (ref 4.0–10.5)

## 2017-01-04 LAB — COMPREHENSIVE METABOLIC PANEL
ALT: 121 U/L — AB (ref 17–63)
AST: 151 U/L — AB (ref 15–41)
Albumin: 3.4 g/dL — ABNORMAL LOW (ref 3.5–5.0)
Alkaline Phosphatase: 307 U/L — ABNORMAL HIGH (ref 38–126)
Anion gap: 9 (ref 5–15)
BILIRUBIN TOTAL: 2.3 mg/dL — AB (ref 0.3–1.2)
BUN: 17 mg/dL (ref 6–20)
CHLORIDE: 106 mmol/L (ref 101–111)
CO2: 25 mmol/L (ref 22–32)
CREATININE: 1.15 mg/dL (ref 0.61–1.24)
Calcium: 8.9 mg/dL (ref 8.9–10.3)
GFR calc Af Amer: >60 mL/min (ref >60)
Glucose, Bld: 108 mg/dL — ABNORMAL HIGH (ref 65–99)
Potassium: 4 mmol/L (ref 3.5–5.1)
Sodium: 140 mmol/L (ref 135–145)
Total Protein: 7.5 g/dL (ref 6.5–8.1)

## 2017-01-04 LAB — PROTIME-INR
INR: 1
PROTHROMBIN TIME: 13.2 s (ref 11.4–15.2)

## 2017-01-04 SURGERY — ENDOSCOPIC RETROGRADE CHOLANGIOPANCREATOGRAPHY (ERCP) WITH PROPOFOL
Anesthesia: General

## 2017-01-04 MED ORDER — FENTANYL CITRATE (PF) 100 MCG/2ML IJ SOLN
INTRAMUSCULAR | Status: AC
  Filled 2017-01-04: qty 2

## 2017-01-04 MED ORDER — CIPROFLOXACIN IN D5W 400 MG/200ML IV SOLN
INTRAVENOUS | Status: AC
  Filled 2017-01-04: qty 200

## 2017-01-04 MED ORDER — HYDROMORPHONE HCL 2 MG PO TABS
2.0000 mg | ORAL_TABLET | ORAL | Status: DC | PRN
  Administered 2017-01-04 – 2017-01-05 (×8): 4 mg via ORAL
  Administered 2017-01-06: 2 mg via ORAL
  Administered 2017-01-06 (×2): 4 mg via ORAL
  Filled 2017-01-04 (×2): qty 2
  Filled 2017-01-04: qty 1
  Filled 2017-01-04 (×9): qty 2

## 2017-01-04 MED ORDER — PHENYLEPHRINE 40 MCG/ML (10ML) SYRINGE FOR IV PUSH (FOR BLOOD PRESSURE SUPPORT)
PREFILLED_SYRINGE | INTRAVENOUS | Status: AC
  Filled 2017-01-04: qty 10

## 2017-01-04 MED ORDER — SUGAMMADEX SODIUM 200 MG/2ML IV SOLN
INTRAVENOUS | Status: DC | PRN
  Administered 2017-01-04: 200 mg via INTRAVENOUS

## 2017-01-04 MED ORDER — PHENYLEPHRINE HCL 10 MG/ML IJ SOLN
INTRAMUSCULAR | Status: DC | PRN
  Administered 2017-01-04 (×3): 80 ug via INTRAVENOUS

## 2017-01-04 MED ORDER — MIDAZOLAM HCL 2 MG/2ML IJ SOLN
INTRAMUSCULAR | Status: AC
  Filled 2017-01-04: qty 2

## 2017-01-04 MED ORDER — FENTANYL CITRATE (PF) 100 MCG/2ML IJ SOLN
INTRAMUSCULAR | Status: DC | PRN
  Administered 2017-01-04 (×2): 50 ug via INTRAVENOUS

## 2017-01-04 MED ORDER — ATENOLOL 50 MG PO TABS
50.0000 mg | ORAL_TABLET | Freq: Every day | ORAL | Status: DC
  Administered 2017-01-05: 50 mg via ORAL
  Filled 2017-01-04: qty 1

## 2017-01-04 MED ORDER — MEPERIDINE HCL 100 MG/ML IJ SOLN
25.0000 mg | Freq: Once | INTRAMUSCULAR | Status: AC
  Administered 2017-01-04: 25 mg via INTRAVENOUS

## 2017-01-04 MED ORDER — PROPOFOL 10 MG/ML IV BOLUS
INTRAVENOUS | Status: DC | PRN
  Administered 2017-01-04: 200 mg via INTRAVENOUS

## 2017-01-04 MED ORDER — MEPERIDINE HCL 100 MG/ML IJ SOLN
INTRAMUSCULAR | Status: AC
  Filled 2017-01-04: qty 1

## 2017-01-04 MED ORDER — KETOROLAC TROMETHAMINE 30 MG/ML IJ SOLN
30.0000 mg | Freq: Once | INTRAMUSCULAR | Status: AC
  Administered 2017-01-04: 30 mg via INTRAVENOUS
  Filled 2017-01-04: qty 1

## 2017-01-04 MED ORDER — CIPROFLOXACIN IN D5W 400 MG/200ML IV SOLN
INTRAVENOUS | Status: DC | PRN
  Administered 2017-01-04: 400 mg via INTRAVENOUS

## 2017-01-04 MED ORDER — SENNOSIDES-DOCUSATE SODIUM 8.6-50 MG PO TABS: 1.0000 | ORAL_TABLET | Freq: Every evening | ORAL | Status: DC | PRN

## 2017-01-04 MED ORDER — LIDOCAINE HCL 1 % IJ SOLN
INTRAMUSCULAR | Status: AC | PRN
  Administered 2017-01-04: 10 mL

## 2017-01-04 MED ORDER — TRIFLURIDINE-TIPIRACIL 20-8.19 MG PO TABS: 35.0000 mg/m2 | ORAL_TABLET | Freq: Two times a day (BID) | ORAL | Status: DC

## 2017-01-04 MED ORDER — ONDANSETRON HCL 4 MG PO TABS
4.0000 mg | ORAL_TABLET | Freq: Four times a day (QID) | ORAL | Status: DC | PRN
  Administered 2017-01-10: 4 mg via ORAL
  Filled 2017-01-04 (×2): qty 1

## 2017-01-04 MED ORDER — FENTANYL CITRATE (PF) 100 MCG/2ML IJ SOLN
INTRAMUSCULAR | Status: AC | PRN
  Administered 2017-01-04 (×4): 50 ug via INTRAVENOUS

## 2017-01-04 MED ORDER — ONDANSETRON HCL 4 MG/2ML IJ SOLN
INTRAMUSCULAR | Status: AC
  Filled 2017-01-04: qty 2

## 2017-01-04 MED ORDER — PROPOFOL 10 MG/ML IV BOLUS
INTRAVENOUS | Status: AC
  Filled 2017-01-04: qty 20

## 2017-01-04 MED ORDER — SUCCINYLCHOLINE CHLORIDE 200 MG/10ML IV SOSY
PREFILLED_SYRINGE | INTRAVENOUS | Status: AC
  Filled 2017-01-04: qty 10

## 2017-01-04 MED ORDER — ONDANSETRON HCL 4 MG/2ML IJ SOLN
4.0000 mg | Freq: Four times a day (QID) | INTRAMUSCULAR | Status: DC | PRN
  Administered 2017-01-06 – 2017-01-11 (×3): 4 mg via INTRAVENOUS
  Filled 2017-01-04 (×3): qty 2

## 2017-01-04 MED ORDER — LIDOCAINE 2% (20 MG/ML) 5 ML SYRINGE
INTRAMUSCULAR | Status: DC | PRN
  Administered 2017-01-04: 50 mg via INTRAVENOUS

## 2017-01-04 MED ORDER — POLYETHYLENE GLYCOL 3350 17 G PO PACK
17.0000 g | PACK | Freq: Every day | ORAL | Status: DC
  Administered 2017-01-05 – 2017-01-12 (×5): 17 g via ORAL
  Filled 2017-01-04 (×8): qty 1

## 2017-01-04 MED ORDER — MIDAZOLAM HCL 2 MG/2ML IJ SOLN
INTRAMUSCULAR | Status: AC | PRN
  Administered 2017-01-04 (×4): 1 mg via INTRAVENOUS

## 2017-01-04 MED ORDER — DIAZEPAM 5 MG PO TABS: 5.0000 mg | ORAL_TABLET | Freq: Three times a day (TID) | ORAL | Status: DC | PRN

## 2017-01-04 MED ORDER — POTASSIUM CHLORIDE CRYS ER 20 MEQ PO TBCR
20.0000 meq | EXTENDED_RELEASE_TABLET | Freq: Every day | ORAL | Status: DC
  Administered 2017-01-05 – 2017-01-12 (×8): 20 meq via ORAL
  Filled 2017-01-04 (×8): qty 1

## 2017-01-04 MED ORDER — ONDANSETRON HCL 4 MG/2ML IJ SOLN
INTRAMUSCULAR | Status: DC | PRN
  Administered 2017-01-04: 4 mg via INTRAVENOUS

## 2017-01-04 MED ORDER — ACETAMINOPHEN 325 MG PO TABS
650.0000 mg | ORAL_TABLET | Freq: Four times a day (QID) | ORAL | Status: DC | PRN
  Administered 2017-01-04: 650 mg via ORAL
  Filled 2017-01-04: qty 2

## 2017-01-04 MED ORDER — ONDANSETRON HCL 4 MG/2ML IJ SOLN
INTRAMUSCULAR | Status: AC | PRN
  Administered 2017-01-04: 4 mg via INTRAVENOUS

## 2017-01-04 MED ORDER — IOPAMIDOL (ISOVUE-300) INJECTION 61%
INTRAVENOUS | Status: AC
  Administered 2017-01-04: 25 mL
  Filled 2017-01-04: qty 50

## 2017-01-04 MED ORDER — LIDOCAINE 2% (20 MG/ML) 5 ML SYRINGE
INTRAMUSCULAR | Status: AC
  Filled 2017-01-04: qty 5

## 2017-01-04 MED ORDER — MIDAZOLAM HCL 5 MG/5ML IJ SOLN
INTRAMUSCULAR | Status: DC | PRN
Start: 2017-01-04 — End: 2017-01-04
  Administered 2017-01-04: 2 mg via INTRAVENOUS

## 2017-01-04 MED ORDER — PANTOPRAZOLE SODIUM 40 MG PO TBEC
40.0000 mg | DELAYED_RELEASE_TABLET | Freq: Every day | ORAL | Status: DC
  Administered 2017-01-05: 40 mg via ORAL
  Filled 2017-01-04: qty 1

## 2017-01-04 MED ORDER — GLUCAGON HCL RDNA (DIAGNOSTIC) 1 MG IJ SOLR
INTRAMUSCULAR | Status: AC
  Filled 2017-01-04: qty 1

## 2017-01-04 MED ORDER — SODIUM CHLORIDE 0.9% FLUSH
5.0000 mL | Freq: Three times a day (TID) | INTRAVENOUS | Status: DC
  Administered 2017-01-04 – 2017-01-11 (×10): 5 mL via INTRAVENOUS

## 2017-01-04 MED ORDER — SODIUM CHLORIDE 0.9 % IV SOLN: INTRAVENOUS | Status: DC

## 2017-01-04 MED ORDER — FAMOTIDINE 20 MG PO TABS
20.0000 mg | ORAL_TABLET | Freq: Every day | ORAL | Status: DC
  Administered 2017-01-05: 20 mg via ORAL
  Filled 2017-01-04: qty 1

## 2017-01-04 MED ORDER — PIPERACILLIN-TAZOBACTAM 4.5 G IVPB: 4.5000 g | Freq: Four times a day (QID) | INTRAVENOUS | Status: DC

## 2017-01-04 MED ORDER — LIDOCAINE HCL 1 % IJ SOLN
INTRAMUSCULAR | Status: AC
  Filled 2017-01-04: qty 20

## 2017-01-04 MED ORDER — ENOXAPARIN SODIUM 40 MG/0.4ML ~~LOC~~ SOLN: 40.0000 mg | SUBCUTANEOUS | Status: DC

## 2017-01-04 MED ORDER — LACTATED RINGERS IV SOLN
INTRAVENOUS | Status: DC | PRN
  Administered 2017-01-04 (×2): via INTRAVENOUS

## 2017-01-04 MED ORDER — PIPERACILLIN-TAZOBACTAM 3.375 G IVPB
3.3750 g | Freq: Three times a day (TID) | INTRAVENOUS | Status: DC
  Administered 2017-01-04 – 2017-01-09 (×15): 3.375 g via INTRAVENOUS
  Filled 2017-01-04 (×15): qty 50

## 2017-01-04 MED ORDER — ROCURONIUM BROMIDE 50 MG/5ML IV SOSY
PREFILLED_SYRINGE | INTRAVENOUS | Status: AC
  Filled 2017-01-04: qty 5

## 2017-01-04 MED ORDER — BISACODYL 5 MG PO TBEC: 5.0000 mg | DELAYED_RELEASE_TABLET | Freq: Every day | ORAL | Status: DC | PRN

## 2017-01-04 MED ORDER — VITAMIN B-12 2500 MCG SL SUBL: 1.0000 | SUBLINGUAL_TABLET | Freq: Every day | SUBLINGUAL | Status: DC

## 2017-01-04 MED ORDER — ROCURONIUM BROMIDE 50 MG/5ML IV SOSY
PREFILLED_SYRINGE | INTRAVENOUS | Status: DC | PRN
  Administered 2017-01-04: 10 mg via INTRAVENOUS
  Administered 2017-01-04: 30 mg via INTRAVENOUS

## 2017-01-04 MED ORDER — MIDAZOLAM HCL 2 MG/2ML IJ SOLN
INTRAMUSCULAR | Status: AC
  Filled 2017-01-04: qty 4

## 2017-01-04 MED ORDER — INDOMETHACIN 50 MG RE SUPP
RECTAL | Status: AC
  Filled 2017-01-04: qty 2

## 2017-01-04 MED ORDER — METOCLOPRAMIDE HCL 10 MG PO TABS
10.0000 mg | ORAL_TABLET | Freq: Three times a day (TID) | ORAL | Status: DC
  Administered 2017-01-04 – 2017-01-12 (×24): 10 mg via ORAL
  Filled 2017-01-04 (×24): qty 1

## 2017-01-04 MED ORDER — FENTANYL CITRATE (PF) 100 MCG/2ML IJ SOLN
INTRAMUSCULAR | Status: DC
Start: 2017-01-04 — End: 2017-01-05
  Filled 2017-01-04: qty 4

## 2017-01-04 MED ORDER — VITAMIN B-12 1000 MCG PO TABS
2500.0000 ug | ORAL_TABLET | Freq: Every day | ORAL | Status: DC
  Administered 2017-01-05 – 2017-01-12 (×8): 2500 ug via ORAL
  Filled 2017-01-04 (×9): qty 3

## 2017-01-04 MED ORDER — SUCCINYLCHOLINE CHLORIDE 200 MG/10ML IV SOSY
PREFILLED_SYRINGE | INTRAVENOUS | Status: DC | PRN
  Administered 2017-01-04: 100 mg via INTRAVENOUS

## 2017-01-04 MED ORDER — SUGAMMADEX SODIUM 200 MG/2ML IV SOLN
INTRAVENOUS | Status: AC
  Filled 2017-01-04: qty 2

## 2017-01-04 MED ORDER — SODIUM CHLORIDE 0.9 % IV SOLN
INTRAVENOUS | Status: AC
  Administered 2017-01-04: 18:00:00 via INTRAVENOUS

## 2017-01-04 NOTE — Procedures (Signed)
Interventional Radiology Procedure Note  Procedure: Image-guided PTC with int-ext drain placed.  10F int/ext drain placed to gravity.     Complications: None  Findings: Obstructed biliary stent.  Dilated right sided ducts and segment 4 ducts.   New 10F int/ext beyond the obstruction into the duodenum.   Recommendations:  - Recovery in Endo for IM consult. Thank you - Admit for observation, 23 hr obs to hospitalist service, thank you - Dr Jacobs and [facility] to follow - Int/ext drain to gravity.  Record output per shift.  TID sterile saline flushes.  DO NOT ASPIRATE - Do not submerge   - Routine drain care   Signed,  Jaime S. Wagner, DO    

## 2017-01-04 NOTE — Progress Notes (Signed)
Report called to 3W RN, Nancy Marus.

## 2017-01-04 NOTE — Op Note (Addendum)
[facility] Patient Name: Bryan Fields Procedure Date: 01/04/2017 MRN: [id number] Attending MD: Milus Banister , MD Date of Birth: Sep 30, 1960 CSN: [id number] Age: 56 Admit Type: Outpatient Procedure:                ERCP Indications:              metastatic colon cancer; incomplete biliary                            drainage despite two bile duct stents placed by                            ERCPs. Providers:                Milus Banister, MD, Laverta Baltimore RN, RN,                            Elspeth Cho Tech., Technician, Virgia Land, CRNA Referring MD:              Medicines:                Monitored Anesthesia Care, Cipro 503 mg IV Complications:            No immediate complications. Estimated blood loss:                            None Estimated Blood Loss:     Estimated blood loss: none. Procedure:                Pre-Anesthesia Assessment:                           - Prior to the procedure, a History and Physical                            was performed, and patient medications and                            allergies were reviewed. The patient's tolerance of                            previous anesthesia was also reviewed. The risks                            and benefits of the procedure and the sedation                            options and risks were discussed with the patient.                            All questions were answered, and informed consent                            was obtained. Prior Anticoagulants: The patient has  taken no previous anticoagulant or antiplatelet                            agents. ASA Grade Assessment: III - A patient with                            severe systemic disease. After reviewing the risks                            and benefits, the patient was deemed in                            satisfactory condition to undergo the procedure.                            After obtaining informed  consent, the scope was                            passed under direct vision. Throughout the                            procedure, the patient's blood pressure, pulse, and                            oxygen saturations were monitored continuously.                           Scout film showed the previously placed biliary                            stents (long plastic stent within a permanent metal                            stent) in the RUQ. The side viewing [id number]                            ([id number]) scope was introduced through the mouth                            and advanced to the major papilla with difficulty.                            The duodenum was anatomically distorted and it was                            necessary to use abdominal pressure and lift the                            patient on his left side to advance the scope to                            the region of the major papilla. The previously  placed plastic stent was extending from the papilla                            in good position. I used a flexible snare to grab                            the distal end of the stent and pull it from the                            patient. Following this I switched to a standard                            adult gastroscope to better inspect the UGI tract.                            The patient tolerated the procedure well. Scope In: Scope Out: Findings:      1. Distorted duodenal anatomy (presumed to be from nearby large masses).      2. The previously placed plastic biliary stent was removed with a snare.      3. Medium sized distal esophagus varices without signs of recent       bleeding.      4. Mild portal gastropathy changes, no gastric varices. Impression:               - Previously placed plastic biliary stent was                            removed. The previously placed uncovered metal                            biliary stent is still in  place.                           - He is getting portal hypertensive changes from                            the periportal mass (medium sized distal esophagus                            varices and mild portal gastropathy).                           - Duodenal anatomy is distorted (from nearby                            abdominal, periportal masses). Moderate Sedation:      N/A- Per Anesthesia Care Recommendation:           - Observe patient in GI recovery unit for ongoing                            care until he is called to IR for a PTC internal  external drain.                           - Admitted for observation after the IR procedure                            (by hospitalist team), hopefully home tomorrow.                           - At time of discharge his atenolol should be                            stopped and instead he should take nadolol '80mg'$ s                            once daily to decrease the chance of bleeding from                            the newly detected esophageal varices. In patient                            GI team will round on him tomorrow. Recommend 3-5                            more days of oral cipro at time of discharge. Procedure Code(s):        --- Professional ---                           [phone], Endoscopic retrograde                            cholangiopancreatography (ERCP); with removal of                            foreign body(s) or stent(s) from biliary/pancreatic                            duct(s) Diagnosis Code(s):        --- Professional ---                           M76.72, Encounter for fitting and adjustment of                            other gastrointestinal appliance and device                           R17, Unspecified jaundice CPT copyright 2016 [employer]. All rights reserved. The codes documented in this report are preliminary and upon coder review may  be revised to meet current  compliance requirements. Milus Banister, MD 01/04/2017 11:03:35 AM This report has been signed electronically. Number of Addenda: 0

## 2017-01-04 NOTE — Transfer of Care (Signed)
Immediate Anesthesia Transfer of Care Note  Patient: Bryan Fields  Procedure(s) Performed: Procedure(s): ENDOSCOPIC RETROGRADE CHOLANGIOPANCREATOGRAPHY (ERCP) WITH PROPOFOL (N/A)  Patient Location: PACU  Anesthesia Type:General  Level of Consciousness:  sedated, patient cooperative and responds to stimulation  Airway & Oxygen Therapy:Patient Spontanous Breathing and Patient connected to face mask oxgen  Post-op Assessment:  Report given to PACU RN and Post -op Vital signs reviewed and stable  Post vital signs:  Reviewed and stable  Last Vitals:  Vitals:   01/04/17 0831  BP: 113/75  Pulse: 72  Resp: 12  Temp: 06.3 C    Complications: No apparent anesthesia complications

## 2017-01-04 NOTE — Sedation Documentation (Signed)
Patient is resting, some grimacing noted

## 2017-01-04 NOTE — Sedation Documentation (Signed)
Patient is resting comfortably. 

## 2017-01-04 NOTE — Anesthesia Procedure Notes (Signed)
Procedure Name: Intubation Date/Time: 01/04/2017 9:58 AM Performed by: Maxwell Caul Pre-anesthesia Checklist: Patient identified, Emergency Drugs available, Suction available and Patient being monitored Patient Re-evaluated:Patient Re-evaluated prior to induction Oxygen Delivery Method: Circle system utilized Preoxygenation: Pre-oxygenation with 100% oxygen Induction Type: IV induction Ventilation: Mask ventilation without difficulty Laryngoscope Size: Mac and 4 Grade View: Grade I Tube type: Oral Tube size: 7.5 mm Number of attempts: 1 Airway Equipment and Method: Stylet Placement Confirmation: ETT inserted through vocal cords under direct vision,  positive ETCO2 and breath sounds checked- equal and bilateral Secured at: 21 cm Tube secured with: Tape Dental Injury: Teeth and Oropharynx as per pre-operative assessment

## 2017-01-04 NOTE — H&P (View-Only) (Signed)
[facility] Progress Note  Subjective:  Feels good.  No significant abdominal pain. Eating better each day.  No diarrhea.  Did have a low grade temp of 100.4 overnight.  Anxious for discharge.  Objective:  Vital signs in last 24 hours: Temp:  [98.4 F (36.9 C)-100.4 F (38 C)] 98.5 F (36.9 C) (07/13 0650) Pulse Rate:  [63-79] 63 (07/13 0650) Resp:  [16-20] 20 (07/13 0650) BP: (92-116)/(58-68) 92/59 (07/13 0650) SpO2:  [96 %-100 %] 96 % (07/13 0650) Last BM Date: 12/16/16 General:  Alert, Well-developed, in NAD; jaundice noted. Heart:  Regular rate and rhythm; no murmurs Pulm:  No increased WOB. Abdomen:  Soft, non-distended.  Normal bowel sounds.  Non-tender.  Extremities:  Without edema. Neurologic:  Alert and oriented x 4;  grossly normal neurologically. Psych:  Alert and cooperative. Normal mood and affect.  Intake/Output from previous day: 07/12 0701 - 07/13 0700 In: 720 [P.O.:720] Out: -   BMET  Recent Labs  12/16/16 0500 12/17/16 0502 12/18/16 0500  NA 133* 135 135  K 3.8 3.8 4.1  CL 98* 101 101  CO2 25 26 27   GLUCOSE 96 136* 96  BUN 7 8 10   CREATININE 0.64 0.75 0.86  CALCIUM 8.4* 8.2* 8.5*   LFT  Recent Labs  12/18/16 0500  PROT 6.6  ALBUMIN 2.6*  AST 52*  ALT 63  ALKPHOS 318*  BILITOT 6.5*   Nm Hepatobiliary Including Gb  Result Date: 12/16/2016 CLINICAL DATA:  Abdominal pain with nausea vomiting. Common bile duct stent with metastatic disease in the region of the porta hepatis. Biliary dilatation. EXAM: NUCLEAR MEDICINE HEPATOBILIARY IMAGING TECHNIQUE: Sequential images of the abdomen were obtained out to 60 minutes following intravenous administration of radiopharmaceutical. RADIOPHARMACEUTICALS:  9.6 mCi Tc-60m  Choletec IV COMPARISON:  CT scan 12/14/2016 FINDINGS: Delayed radiotracer uptake by liver parenchyma and delayed clearance of blood pool activity is compatible with underlying hepatocyte dysfunction. The was given IV  morphine to facilitate gallbladder filling. After 2 hours of total observation, there is no visualization of bile ducts, gallbladder, or small bowel. IMPRESSION: Poor hepatic uptake of radiotracer with delayed blood pool clearance is associated with nonvisualization the bile ducts. Imaging features are consistent with biliary obstruction and hepatocyte dysfunction. Electronically Signed   By: Misty Stanley M.D.   On: 12/16/2016 11:52   Dg Ercp  Result Date: 12/16/2016 CLINICAL DATA:  Adenocarcinoma EXAM: ERCP TECHNIQUE: Multiple spot images obtained with the fluoroscopic device and submitted for interpretation post-procedure. FLUOROSCOPY TIME:  Fluoroscopy Time:  5 minutes and 25 seconds Radiation Exposure Index (if provided by the fluoroscopic device): 124.65 Number of Acquired Spot Images: 0 COMPARISON:  None. FINDINGS: There is a stent in the mid and upper common bile duct. Cannulation of the common bile duct is documented. Contrast fills the biliary tree. There is dilatation of the intrahepatic biliary tree. There is filling of the stented portion of the common bile duct preventing opacification of contrast. Final images demonstrate placement of a plastic stent extending from above the stent into the duodenum. IMPRESSION: Plastic stent placement across the common bile duct as described. These images were submitted for radiologic interpretation only. Please see the procedural report for the amount of contrast and the fluoroscopy time utilized. Electronically Signed   By: Marybelle Killings M.D.   On: 12/16/2016 14:53   Assessment / Plan: 1. Elevated LFT's:Increasing since time of admission, metal biliary stent placed 10/15/16-LFT's had initially trended down, now Bili at 13, concern  for biliary stent obstruction on imaging.  Underwent repeat ERCP on 7/11 with occlusion of existing stent so another stent was placed inside the old one.  Also concern that intrahepatic being occluded by tumor burden.  LFT's  trending down well so far. 2. H/o Metastatic colon cancer to liver: s/p hemicolectomy. Supposed to begin cycle 2 salvage chemotherapy soon. 3. Sepsis due to Klebsiella pneumoniae: Has been treated with multiple antibiotics including vancomycin, Zosyn, Rocephin,metronidazole,and now Unasyn--started 7/7.  ? How long to treat with antibiotics and what PO abx to use.  *Will check with Dr. Hilarie Fredrickson regarding possible D/C home later today.   LOS: 10 days   Prestyn Mahn D.  12/18/2016, 9:08 AM  Pager number [phone]
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
## 2017-01-04 NOTE — Progress Notes (Signed)
Pt does not need port accessed and IV was established in endoscopy. Bryan Fields

## 2017-01-04 NOTE — Sedation Documentation (Signed)
Patient denies pain and is resting comfortably.  

## 2017-01-04 NOTE — Sedation Documentation (Signed)
Pt grimacing in pain, additional sedation medications given as discussed with Dr. Earleen Newport, VSS.

## 2017-01-04 NOTE — Progress Notes (Signed)
Pt transported to IR for procedure and then to be admitted per IR.

## 2017-01-04 NOTE — Anesthesia Postprocedure Evaluation (Signed)
Anesthesia Post Note  Patient: Bryan Fields  Procedure(s) Performed: Procedure(s) (LRB): ENDOSCOPIC RETROGRADE CHOLANGIOPANCREATOGRAPHY (ERCP) WITH PROPOFOL (N/A)     Patient location during evaluation: PACU Anesthesia Type: General Level of consciousness: awake Pain management: pain level controlled Vital Signs Assessment: post-procedure vital signs reviewed and stable Respiratory status: spontaneous breathing Cardiovascular status: stable Anesthetic complications: no    Last Vitals:  Vitals:   01/04/17 0831  BP: 113/75  Pulse: 72  Resp: 12  Temp: 36.6 C    Last Pain:  Vitals:   01/04/17 0831  TempSrc: Oral                 [name]

## 2017-01-04 NOTE — Progress Notes (Signed)
Dr. Myna Hidalgo at beside.

## 2017-01-04 NOTE — Anesthesia Preprocedure Evaluation (Addendum)
Anesthesia Evaluation  Patient identified by MRN, date of birth, ID band Patient awake    Reviewed: Allergy & Precautions, NPO status , Patient's Chart, lab work & pertinent test results  Airway Mallampati: II  TM Distance: >3 FB     Dental   Pulmonary pneumonia, former smoker,    breath sounds clear to auscultation       Cardiovascular hypertension,  Rhythm:Regular Rate:Normal     Neuro/Psych    GI/Hepatic Neg liver ROS, GERD  ,History noted. CG   Endo/Other  negative endocrine ROS  Renal/GU negative Renal ROS     Musculoskeletal   Abdominal   Peds  Hematology   Anesthesia Other Findings   Reproductive/Obstetrics                             Anesthesia Physical Anesthesia Plan  ASA: III  Anesthesia Plan: General   Post-op Pain Management:    Induction: Intravenous  PONV Risk Score and Plan: 3 and Ondansetron, Dexamethasone, Midazolam and Propofol infusion  Airway Management Planned: Oral ETT  Additional Equipment:   Intra-op Plan:   Post-operative Plan: Extubation in OR  Informed Consent: I have reviewed the patients History and Physical, chart, labs and discussed the procedure including the risks, benefits and alternatives for the proposed anesthesia with the patient or authorized representative who has indicated his/her understanding and acceptance.   Dental advisory given  Plan Discussed with: CRNA and Anesthesiologist  Anesthesia Plan Comments:         Anesthesia Quick Evaluation

## 2017-01-04 NOTE — Interval H&P Note (Signed)
History and Physical Interval Note:  01/04/2017 8:39 AM  Bryan Fields  has presented today for surgery, with the diagnosis of liver cancer abn bile duct   The various methods of treatment have been discussed with the patient and family. After consideration of risks, benefits and other options for treatment, the patient has consented to  Procedure(s): ENDOSCOPIC RETROGRADE CHOLANGIOPANCREATOGRAPHY (ERCP) WITH PROPOFOL (N/A) as a surgical intervention .  The patient's history has been reviewed, patient examined, no change in status, stable for surgery.  I have reviewed the patient's chart and labs.  Questions were answered to the patient's satisfaction.     Milus Banister

## 2017-01-04 NOTE — Anesthesia Postprocedure Evaluation (Signed)
Anesthesia Post Note  Patient: Bryan Fields  Procedure(s) Performed: Procedure(s) (LRB): ENDOSCOPIC RETROGRADE CHOLANGIOPANCREATOGRAPHY (ERCP) WITH PROPOFOL (N/A)     Patient location during evaluation: PACU Anesthesia Type: General Level of consciousness: awake Pain management: pain level controlled Vital Signs Assessment: post-procedure vital signs reviewed and stable Respiratory status: spontaneous breathing Cardiovascular status: stable Anesthetic complications: no    Last Vitals:  Vitals:   01/04/17 1250 01/04/17 1255  BP: 105/79   Pulse: 76 72  Resp: (!) 9 17  Temp:      Last Pain:  Vitals:   01/04/17 1055  TempSrc: Oral                 Maurie Musco

## 2017-01-04 NOTE — Progress Notes (Signed)
Pt up to bathroom without trouble.  Pt waiting to be seen by hospitalist to be admitted after his IR procedure. IR procedure is scheduled for 1430. IR called and made aware that patient was ready any time for procedure. Pt to stay up in wheelchair to get out of the stretcher for his back.

## 2017-01-04 NOTE — Progress Notes (Signed)
Pt resting with wife at bedside waiting for IR procedure. No complaints voiced.

## 2017-01-05 ENCOUNTER — Encounter (HOSPITAL_COMMUNITY): Payer: Self-pay | Admitting: Interventional Radiology

## 2017-01-05 DIAGNOSIS — K831 Obstruction of bile duct: Secondary | ICD-10-CM | POA: Diagnosis present

## 2017-01-05 DIAGNOSIS — C229 Malignant neoplasm of liver, not specified as primary or secondary: Secondary | ICD-10-CM | POA: Diagnosis not present

## 2017-01-05 DIAGNOSIS — G893 Neoplasm related pain (acute) (chronic): Secondary | ICD-10-CM | POA: Diagnosis not present

## 2017-01-05 DIAGNOSIS — R591 Generalized enlarged lymph nodes: Secondary | ICD-10-CM | POA: Diagnosis present

## 2017-01-05 DIAGNOSIS — I10 Essential (primary) hypertension: Secondary | ICD-10-CM | POA: Diagnosis not present

## 2017-01-05 DIAGNOSIS — T859XXD Unspecified complication of internal prosthetic device, implant and graft, subsequent encounter: Secondary | ICD-10-CM | POA: Diagnosis not present

## 2017-01-05 DIAGNOSIS — A4159 Other Gram-negative sepsis: Secondary | ICD-10-CM | POA: Diagnosis present

## 2017-01-05 DIAGNOSIS — C7989 Secondary malignant neoplasm of other specified sites: Secondary | ICD-10-CM | POA: Diagnosis present

## 2017-01-05 DIAGNOSIS — R1011 Right upper quadrant pain: Secondary | ICD-10-CM | POA: Diagnosis not present

## 2017-01-05 DIAGNOSIS — C787 Secondary malignant neoplasm of liver and intrahepatic bile duct: Principal | ICD-10-CM

## 2017-01-05 DIAGNOSIS — R5082 Postprocedural fever: Secondary | ICD-10-CM | POA: Diagnosis not present

## 2017-01-05 DIAGNOSIS — G620 Drug-induced polyneuropathy: Secondary | ICD-10-CM | POA: Diagnosis present

## 2017-01-05 DIAGNOSIS — Z87891 Personal history of nicotine dependence: Secondary | ICD-10-CM | POA: Diagnosis not present

## 2017-01-05 DIAGNOSIS — C189 Malignant neoplasm of colon, unspecified: Secondary | ICD-10-CM

## 2017-01-05 DIAGNOSIS — R1010 Upper abdominal pain, unspecified: Secondary | ICD-10-CM | POA: Diagnosis not present

## 2017-01-05 DIAGNOSIS — K219 Gastro-esophageal reflux disease without esophagitis: Secondary | ICD-10-CM | POA: Diagnosis present

## 2017-01-05 DIAGNOSIS — R188 Other ascites: Secondary | ICD-10-CM | POA: Diagnosis present

## 2017-01-05 DIAGNOSIS — D61818 Other pancytopenia: Secondary | ICD-10-CM | POA: Diagnosis not present

## 2017-01-05 DIAGNOSIS — K839 Disease of biliary tract, unspecified: Secondary | ICD-10-CM | POA: Diagnosis not present

## 2017-01-05 DIAGNOSIS — Z923 Personal history of irradiation: Secondary | ICD-10-CM | POA: Diagnosis not present

## 2017-01-05 DIAGNOSIS — Z79899 Other long term (current) drug therapy: Secondary | ICD-10-CM | POA: Diagnosis not present

## 2017-01-05 DIAGNOSIS — Z9221 Personal history of antineoplastic chemotherapy: Secondary | ICD-10-CM | POA: Diagnosis not present

## 2017-01-05 DIAGNOSIS — C182 Malignant neoplasm of ascending colon: Secondary | ICD-10-CM | POA: Diagnosis not present

## 2017-01-05 DIAGNOSIS — D6959 Other secondary thrombocytopenia: Secondary | ICD-10-CM | POA: Diagnosis present

## 2017-01-05 DIAGNOSIS — B961 Klebsiella pneumoniae [K. pneumoniae] as the cause of diseases classified elsewhere: Secondary | ICD-10-CM | POA: Diagnosis present

## 2017-01-05 DIAGNOSIS — K3189 Other diseases of stomach and duodenum: Secondary | ICD-10-CM | POA: Diagnosis present

## 2017-01-05 DIAGNOSIS — I8500 Esophageal varices without bleeding: Secondary | ICD-10-CM | POA: Diagnosis present

## 2017-01-05 DIAGNOSIS — R000 Tachycardia, unspecified: Secondary | ICD-10-CM | POA: Diagnosis not present

## 2017-01-05 DIAGNOSIS — D61810 Antineoplastic chemotherapy induced pancytopenia: Secondary | ICD-10-CM | POA: Diagnosis present

## 2017-01-05 DIAGNOSIS — Z86010 Personal history of colonic polyps: Secondary | ICD-10-CM | POA: Diagnosis not present

## 2017-01-05 DIAGNOSIS — Z89431 Acquired absence of right foot: Secondary | ICD-10-CM | POA: Diagnosis not present

## 2017-01-05 DIAGNOSIS — T451X5A Adverse effect of antineoplastic and immunosuppressive drugs, initial encounter: Secondary | ICD-10-CM | POA: Diagnosis present

## 2017-01-05 LAB — COMPREHENSIVE METABOLIC PANEL
ALT: 196 U/L — ABNORMAL HIGH (ref 17–63)
ANION GAP: 7 (ref 5–15)
AST: 244 U/L — ABNORMAL HIGH (ref 15–41)
Albumin: 3 g/dL — ABNORMAL LOW (ref 3.5–5.0)
Alkaline Phosphatase: 343 U/L — ABNORMAL HIGH (ref 38–126)
BILIRUBIN TOTAL: 4.5 mg/dL — AB (ref 0.3–1.2)
BUN: 15 mg/dL (ref 6–20)
CALCIUM: 8.7 mg/dL — AB (ref 8.9–10.3)
CO2: 26 mmol/L (ref 22–32)
Chloride: 104 mmol/L (ref 101–111)
Creatinine, Ser: 1.15 mg/dL (ref 0.61–1.24)
GFR calc non Af Amer: >60 mL/min (ref >60)
Glucose, Bld: 109 mg/dL — ABNORMAL HIGH (ref 65–99)
Potassium: 3.8 mmol/L (ref 3.5–5.1)
Sodium: 137 mmol/L (ref 135–145)
TOTAL PROTEIN: 7 g/dL (ref 6.5–8.1)

## 2017-01-05 LAB — CBC
HCT: 33.9 % — ABNORMAL LOW (ref 39.0–52.0)
HEMOGLOBIN: 11.3 g/dL — AB (ref 13.0–17.0)
MCH: 32.9 pg (ref 26.0–34.0)
MCHC: 33.3 g/dL (ref 30.0–36.0)
MCV: 98.8 fL (ref 78.0–100.0)
PLATELETS: 130 10*3/uL — AB (ref 150–400)
RBC: 3.43 MIL/uL — AB (ref 4.22–5.81)
RDW: 18.1 % — ABNORMAL HIGH (ref 11.5–15.5)
WBC: 6.4 10*3/uL (ref 4.0–10.5)

## 2017-01-05 LAB — GLUCOSE, CAPILLARY: Glucose-Capillary: 95 mg/dL (ref 65–99)

## 2017-01-05 MED ORDER — LIDOCAINE 5 % EX OINT
TOPICAL_OINTMENT | Freq: Once | CUTANEOUS | Status: AC
  Administered 2017-01-05: 06:00:00 via TOPICAL
  Filled 2017-01-05: qty 35.44

## 2017-01-05 MED ORDER — SODIUM CHLORIDE 0.9 % IV BOLUS (SEPSIS)
500.0000 mL | Freq: Once | INTRAVENOUS | Status: AC
  Administered 2017-01-05: 500 mL via INTRAVENOUS

## 2017-01-05 MED ORDER — ACETAMINOPHEN 325 MG PO TABS: 650.0000 mg | ORAL_TABLET | Freq: Once | ORAL | Status: DC

## 2017-01-05 MED ORDER — IBUPROFEN 200 MG PO TABS
400.0000 mg | ORAL_TABLET | Freq: Once | ORAL | Status: AC
  Administered 2017-01-05: 400 mg via ORAL
  Filled 2017-01-05: qty 2

## 2017-01-05 MED ORDER — LIDOCAINE 4 % EX CREA
TOPICAL_CREAM | Freq: Once | CUTANEOUS | Status: DC
  Filled 2017-01-05: qty 5

## 2017-01-05 MED ORDER — ENOXAPARIN SODIUM 40 MG/0.4ML ~~LOC~~ SOLN
40.0000 mg | SUBCUTANEOUS | Status: DC
  Administered 2017-01-05 – 2017-01-11 (×7): 40 mg via SUBCUTANEOUS
  Filled 2017-01-05 (×8): qty 0.4

## 2017-01-05 NOTE — Progress Notes (Signed)
Progress Note   Subjective  Chief Complaint: liver cancer, abnormal bile duct-s/p ERCP with removal of plastic stent (metal stent still in place) and IR placement of Int/Ext biliary drain 01/04/17  Today, the patient explains that he did not feel well "at all" after placement of the stent yesterday, in fact, per review of chart he developed rigors and had worsened RUQ pain. Currently, the patient tells me that he has felt much better today with no further rever or chills. He has tolerated his clear liquid diet. He does continue with some RUQ discomfort rated as a 5/10. Biliary drain is patent and has already been emptied once today per family. Overall patient feels "ok" this afternoon.    Objective   Vital signs in last 24 hours: Temp:  [98.1 F (36.7 C)-102.7 F (39.3 C)] 98.2 F (36.8 C) (07/31 0454) Pulse Rate:  [68-101] 79 (07/31 0454) Resp:  [9-26] 18 (07/31 0454) BP: (105-166)/(69-100) 121/86 (07/31 0454) SpO2:  [96 %-100 %] 100 % (07/31 0454) Weight:  [220 lb 10.9 oz (100.1 kg)-221 lb 9 oz (100.5 kg)] 220 lb 10.9 oz (100.1 kg) (07/31 0454) Last BM Date: 01/03/17 General:  Caucasian male in NAD Heart:  Regular rate and rhythm; no murmurs Lungs: Respirations even and unlabored, lungs CTA bilaterally Abdomen:  Soft, mild RUQ tenderness, biliary stent in place with clear bandages and drainage of bilious material and nondistended. Normal bowel sounds. Extremities:  Without edema. Neurologic:  Alert and oriented,  grossly normal neurologically. Psych:  Cooperative. Normal mood and affect.  Intake/Output from previous day: 07/30 0701 - 07/31 0700 In: 2193.8 [I.V.:2093.8; IV Piggyback:100] Out: 300 [Urine:300] Intake/Output this shift: Total I/O In: 480 [P.O.:480] Out: 250 [Urine:250]  Lab Results:  Recent Labs  01/04/17 0930 01/05/17 0641  WBC 3.7* 6.4  HGB 11.5* 11.3*  HCT 34.2* 33.9*  PLT 145* 130*   BMET  Recent Labs  01/04/17 0930 01/05/17 0641  NA 140  137  K 4.0 3.8  CL 106 104  CO2 25 26  GLUCOSE 108* 109*  BUN 17 15  CREATININE 1.15 1.15  CALCIUM 8.9 8.7*   LFT  Recent Labs  01/05/17 0641  PROT 7.0  ALBUMIN 3.0*  AST 244*  ALT 196*  ALKPHOS 343*  BILITOT 4.5*   PT/INR  Recent Labs  01/04/17 0930  LABPROT 13.2  INR 1.00    Studies/Results: Dg Ercp  Result Date: 01/04/2017 CLINICAL DATA:  56 year old male with a 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 He has an internal metal stent which has become occluded. A plastic biliary stent was recently placed and will now be removed prior to percutaneous transhepatic cholangiogram and internal/external biliary drain placement. EXAM: ERCP TECHNIQUE: Multiple spot images obtained with the fluoroscopic device and submitted for interpretation post-procedure. FLUOROSCOPY TIME:  Please see operative note for detail. COMPARISON:  MRCP 12/24/2016 FINDINGS: Three intraoperative saved images demonstrate a flexible endoscope in the descending duodenum. A plastic biliary stent is noted passing coaxially through a metallic stent. On the final image, the plastic stent is no longer present. IMPRESSION: Successful retrieval of plastic biliary stent. These images were submitted for radiologic interpretation only. Please see the procedural report for the amount of contrast and the fluoroscopy time utilized. Electronically Signed   By: Jacqulynn Cadet M.D.   On: 01/04/2017 11:31   Ir Int Lianne Cure Biliary Drain With Cholangiogram  Result Date: 01/05/2017 INDICATION: 56 year old male with a history of metastatic colon cancer,  status post left hepatectomy, with recurrent disease at the liver hilum contributing to biliary obstruction. Prior endoscopy with metal stent placement (uncovered) of the common hepatic duct Oct 15, 2016, with subsequent ERCP and plastic stent placement 12/16/2016. He has had persistent hyperbilirubinemia with concern for ongoing obstruction. He  presents for percutaneous transhepatic cholangiogram and drain placement EXAM: PERCUTANEOUS TRANSHEPATIC CHOLANGIOGRAM WITH INTERNAL/ EXTERNAL DRAIN PLACEMENT MEDICATIONS: 25 mg IV Demerol, 4 mg IV Zofran ANESTHESIA/SEDATION: Moderate (conscious) sedation was employed during this procedure. A total of Versed 4.0 mg and Fentanyl 200 mcg was administered intravenously. Moderate Sedation Time: 50 minutes. The patient's level of consciousness and vital signs were monitored continuously by radiology nursing throughout the procedure under my direct supervision. FLUOROSCOPY TIME:  Fluoroscopy Time: 9 minutes 48 seconds (754 mGy). COMPLICATIONS: None PROCEDURE: The procedure, risks, benefits, and alternatives were explained to the patient and the patient's family. A complete informed consent was performed, with risk benefit analysis. Specific risks that were discussed for the procedure include bleeding, infection, biliary sepsis, IC use day, organ injury, need for further procedure, need for further surgery, long-term drain placement, cardiopulmonary collapse, death. Questions regarding the procedure were encouraged and answered. The patient understands and consents to the procedure. Patient is position in supine position on the fluoroscopy table, and the upper abdomen was prepped and draped in the usual sterile fashion. Maximum barrier sterile technique with sterile gowns and gloves were used for the procedure. A timeout was performed prior to the initiation of the procedure. Local anesthesia was provided with 1% lidocaine with epinephrine. Scout images of the upper abdomen were performed. The image intensifier was angulated, right anterior oblique approximately 10-20 degrees. 1% lidocaine was used for local anesthesia, with generous infiltration of the skin and subcutaneous tissues in and inter left costal location. A Chiba needle was used to access the biliary system, targeting the cranial aspect of the indwelling  metallic stent of the common hepatic duct. Once the tip of the needle was confirmed within the biliary system by injecting small aliquots of contrast, images were stored of the biliary system after partially opacifying the biliary tree via the needle. A second access was required for access into the biliary system, from right intercostal location. Once the tip of this needle was confirmed within the biliary system, an 018 wire was advanced centrally. The needle was removed, a small incision was made with an 11 blade scalpel, and then the inner catheter from a triaxial Accustick system was advanced into the biliary system. This access was determined to be with thin a sectoral duct of the right posterior liver lobe, which enter the common hepatic duct below the cranial stent margin. A third access was required for access into the biliary system, from separate right intercostal location. Once the tip of this needle was confirmed within the biliary system, an 018 wire was advanced centrally. The needle was removed, a small incision was made with an 11 blade scalpel, and then Accustick system was advanced into the biliary system. The metal stiffener and dilator were removed, we confirmed placement with contrast infusion. A coaxial Glidewire and 4 French glide cath were then used to navigate across the obstruction at the stent. Once the catheter were within the duodenum, the wire was removed and contrast confirmed location. A Benson wire was advanced through the system, and the Accustick and Glidewire were removed. Dilation of the subcutaneous tissue tracks was performed with an 8 [place] dilator, and then a 10 [place] biliary drain was  placed as an internal/external biliary drain. Small amount of contrast confirmed location. The patient tolerated the procedure well and remained hemodynamically stable throughout. No complications were encountered and no significant blood loss was encountered. FINDINGS: Percutaneous  transhepatic cholangiogram demonstrates complete occlusion of the uncovered metal stent of the common hepatic duct. More proximally there is dilation of the sectoral ducts of anterior and posterior right liver and remnant segment 4 ducts. IMPRESSION: Status post percutaneous transhepatic cholangiogram and placement of internal/external biliary drain via a right-sided intercostal approach. Signed, Dulcy Fanny. Earleen Newport, DO [facility] Electronically Signed   By: Corrie Mckusick D.O.   On: 01/05/2017 10:51       Assessment / Plan:   Assessment: 1. Biliary Obstruction: ERCP procedure 01/04/17 followed by IR placement of int/ext biliary drain 2. Colon cancer with mets 3. 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 Ordered regular diet for the patient 2. Agree with plan for patient to stay one more night to observe for any further fevers 3. Discussed with Dr. Ardis Hughs 4. Likely IR will follow with patient to give him drain instructions, if they do not, they will need to be called prior to patient discharge 5. Patient should be allowed to ambulate  We will continue to follow along   LOS: 0 days   Levin Erp  01/05/2017, 12:46 PM  Pager # [phone]   Attending physician's note   I have taken an interval history, reviewed the chart and examined the patient. I agree with the Advanced Practitioner's note, impression and recommendations.   Damaris Hippo, MD [phone] Mon-Fri 8a-5p [phone] after 5p, weekends, holidays

## 2017-01-05 NOTE — Progress Notes (Signed)
PROGRESS NOTE    Bryan Fields  BOF:[id number] DOB: 1961/04/21 DOA: 01/04/2017 PCP: Ria Bush, MD  Brief Narrative: Bryan Fields is a 56 y.o. male with medical history significant for colon cancer with liver metastases status post partial hepatectomy and subsequent biliary obstruction status post stenting, 7/30 underwent ERCP with removal of the stent and placement of Ext/internal biliary drain in Radiology at [facility], after his procedures had rigors, tachycardia/fevers hence admitted. Patient was being followed in the outpatient setting, bilirubin was not normalizing despite biliary stent placement, and recent MRI revealed mild to moderate intrahepatic biliary ductal dilation with widespread metastatic disease in the abdomen. 7/30 started on Iv Zosyn, blood cx sent  Assessment & Plan:     Biliary obstruction -due to metastatic colon CA -s/o ERCP with repeat stenting 7/11, but no improvement in Bili -7/30 underwent ERCP with stent removal and Ext/internal biliary drains per IR -Bili unchanged today, Cmet in am   Fever/SIRS -temp of 102 and 101 last pm -? Transient bacteremia in setting of above procedures -FU Blood Cx -Continue Iv Zosyn day 2    Metastatic Colon Ca -followed by Dr.Sherril, will notify Dr.Sherril of admission -chemo on hold due to biliary obstruction   Pancytopenia -due to chemo/monitor    Essential hypertension -stable, continue atenolol   DVT prophylaxis:lovenox Code Status: Full Code Family Communication: wife at bedside Disposition Plan: Home in 1-2days if stable  Consultants:   Gi  IR  Onc Dr.Sherril notified   Procedures: 7/30: ERCP with Stent removal  7/30 by IR: New 69F int/ext biliary drain   Antimicrobials:   Zosyn 7/30   Subjective: Feels better, no N/V, some discomfort at the drain site  Objective: Vitals:   01/04/17 2132 01/04/17 2250 01/05/17 0454 01/05/17 1349  BP:   121/86 137/74  Pulse:   79 80  Resp:   18  17  Temp: (!) 101.9 F (38.8 C) 99.2 F (37.3 C) 98.2 F (36.8 C) 98.4 F (36.9 C)  TempSrc: Oral Oral Oral Oral  SpO2:   100% 96%  Weight:   100.1 kg (220 lb 10.9 oz)   Height:        Intake/Output Summary (Last 24 hours) at 01/05/17 1352 Last data filed at 01/05/17 1350  Gross per 24 hour  Intake          1913.75 ml  Output              550 ml  Net          1363.75 ml   Filed Weights   01/04/17 0831 01/04/17 1815 01/05/17 0454  Weight: 100.7 kg (222 lb) 100.5 kg (221 lb 9 oz) 100.1 kg (220 lb 10.9 oz)    Examination:  General exam: Appears calm and comfortable, ill appearing Respiratory system: decreased BS at bases Cardiovascular system: S1 & S2 heard, RRR. No JVD, murmurs Gastrointestinal system: Abdomen is soft, slightly distended, biliary drain noted, soft and nontender, BS present Central nervous system: Alert and oriented. No focal neurological deficits. Extremities: Symmetric 5 x 5 power. Skin: No rashes, lesions or ulcers Psychiatry: Judgement and insight appear normal. Mood & affect appropriate.     Data Reviewed:   CBC:  Recent Labs Lab 01/04/17 0930 01/05/17 0641  WBC 3.7* 6.4  NEUTROABS 2.5  --   HGB 11.5* 11.3*  HCT 34.2* 33.9*  MCV 97.2 98.8  PLT 145* 778*   Basic Metabolic Panel:  Recent Labs Lab 01/04/17 0930 01/05/17 0641  NA  140 137  K 4.0 3.8  CL 106 104  CO2 25 26  GLUCOSE 108* 109*  BUN 17 15  CREATININE 1.15 1.15  CALCIUM 8.9 8.7*   GFR: Estimated Creatinine Clearance: 84.4 mL/min (by C-G formula based on SCr of 1.15 mg/dL). Liver Function Tests:  Recent Labs Lab 01/04/17 0930 01/05/17 0641  AST 151* 244*  ALT 121* 196*  ALKPHOS 307* 343*  BILITOT 2.3* 4.5*  PROT 7.5 7.0  ALBUMIN 3.4* 3.0*   No results for input(s): LIPASE, AMYLASE in the last 168 hours. No results for input(s): AMMONIA in the last 168 hours. Coagulation Profile:  Recent Labs Lab 01/04/17 0930  INR 1.00   Cardiac Enzymes: No results  for input(s): CKTOTAL, CKMB, CKMBINDEX, TROPONINI in the last 168 hours. BNP (last 3 results) No results for input(s): PROBNP in the last 8760 hours. HbA1C: No results for input(s): HGBA1C in the last 72 hours. CBG:  Recent Labs Lab 01/05/17 0714  GLUCAP 95   Lipid Profile: No results for input(s): CHOL, HDL, LDLCALC, TRIG, CHOLHDL, LDLDIRECT in the last 72 hours. Thyroid Function Tests: No results for input(s): TSH, T4TOTAL, FREET4, T3FREE, THYROIDAB in the last 72 hours. Anemia Panel: No results for input(s): VITAMINB12, FOLATE, FERRITIN, TIBC, IRON, RETICCTPCT in the last 72 hours. Urine analysis:    Component Value Date/Time   COLORURINE AMBER (A) 12/08/2016 1708   APPEARANCEUR HAZY (A) 12/08/2016 1708   LABSPEC 1.035 (H) 12/08/2016 1708   LABSPEC 1.020 03/18/2015 0847   PHURINE 5.0 12/08/2016 1708   GLUCOSEU NEGATIVE 12/08/2016 1708   GLUCOSEU Negative 03/18/2015 0847   HGBUR NEGATIVE 12/08/2016 1708   HGBUR small 04/01/2010 0848   BILIRUBINUR MODERATE (A) 12/08/2016 1708   BILIRUBINUR Negative 03/18/2015 0847   KETONESUR NEGATIVE 12/08/2016 1708   PROTEINUR 100 (A) 12/08/2016 1708   UROBILINOGEN 0.2 03/18/2015 0847   NITRITE NEGATIVE 12/08/2016 1708   LEUKOCYTESUR NEGATIVE 12/08/2016 1708   LEUKOCYTESUR Negative 03/18/2015 0847   Sepsis Labs: @LABRCNTIP (procalcitonin:4,lacticidven:4)  )No results found for this or any previous visit (from the past 240 hour(s)).       Radiology Studies: Dg Ercp  Result Date: 01/04/2017 CLINICAL DATA:  56 year old male with a 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 He has an internal metal stent which has become occluded. A plastic biliary stent was recently placed and will now be removed prior to percutaneous transhepatic cholangiogram and internal/external biliary drain placement. EXAM: ERCP TECHNIQUE: Multiple spot images obtained with the fluoroscopic device and submitted for  interpretation post-procedure. FLUOROSCOPY TIME:  Please see operative note for detail. COMPARISON:  MRCP 12/24/2016 FINDINGS: Three intraoperative saved images demonstrate a flexible endoscope in the descending duodenum. A plastic biliary stent is noted passing coaxially through a metallic stent. On the final image, the plastic stent is no longer present. IMPRESSION: Successful retrieval of plastic biliary stent. These images were submitted for radiologic interpretation only. Please see the procedural report for the amount of contrast and the fluoroscopy time utilized. Electronically Signed   By: Jacqulynn Cadet M.D.   On: 01/04/2017 11:31   Ir Int Lianne Cure Biliary Drain With Cholangiogram  Result Date: 01/05/2017 INDICATION: 56 year old male with a history of metastatic colon cancer, status post left hepatectomy, with recurrent disease at the liver hilum contributing to biliary obstruction. Prior endoscopy with metal stent placement (uncovered) of the common hepatic duct Oct 15, 2016, with subsequent ERCP and plastic stent placement 12/16/2016. He has had persistent hyperbilirubinemia with concern for  ongoing obstruction. He presents for percutaneous transhepatic cholangiogram and drain placement EXAM: PERCUTANEOUS TRANSHEPATIC CHOLANGIOGRAM WITH INTERNAL/ EXTERNAL DRAIN PLACEMENT MEDICATIONS: 25 mg IV Demerol, 4 mg IV Zofran ANESTHESIA/SEDATION: Moderate (conscious) sedation was employed during this procedure. A total of Versed 4.0 mg and Fentanyl 200 mcg was administered intravenously. Moderate Sedation Time: 50 minutes. The patient's level of consciousness and vital signs were monitored continuously by radiology nursing throughout the procedure under my direct supervision. FLUOROSCOPY TIME:  Fluoroscopy Time: 9 minutes 48 seconds (754 mGy). COMPLICATIONS: None PROCEDURE: The procedure, risks, benefits, and alternatives were explained to the patient and the patient's family. A complete informed consent was  performed, with risk benefit analysis. Specific risks that were discussed for the procedure include bleeding, infection, biliary sepsis, IC use day, organ injury, need for further procedure, need for further surgery, long-term drain placement, cardiopulmonary collapse, death. Questions regarding the procedure were encouraged and answered. The patient understands and consents to the procedure. Patient is position in supine position on the fluoroscopy table, and the upper abdomen was prepped and draped in the usual sterile fashion. Maximum barrier sterile technique with sterile gowns and gloves were used for the procedure. A timeout was performed prior to the initiation of the procedure. Local anesthesia was provided with 1% lidocaine with epinephrine. Scout images of the upper abdomen were performed. The image intensifier was angulated, right anterior oblique approximately 10-20 degrees. 1% lidocaine was used for local anesthesia, with generous infiltration of the skin and subcutaneous tissues in and inter left costal location. A Chiba needle was used to access the biliary system, targeting the cranial aspect of the indwelling metallic stent of the common hepatic duct. Once the tip of the needle was confirmed within the biliary system by injecting small aliquots of contrast, images were stored of the biliary system after partially opacifying the biliary tree via the needle. A second access was required for access into the biliary system, from right intercostal location. Once the tip of this needle was confirmed within the biliary system, an 018 wire was advanced centrally. The needle was removed, a small incision was made with an 11 blade scalpel, and then the inner catheter from a triaxial Accustick system was advanced into the biliary system. This access was determined to be with thin a sectoral duct of the right posterior liver lobe, which enter the common hepatic duct below the cranial stent margin. A third  access was required for access into the biliary system, from separate right intercostal location. Once the tip of this needle was confirmed within the biliary system, an 018 wire was advanced centrally. The needle was removed, a small incision was made with an 11 blade scalpel, and then Accustick system was advanced into the biliary system. The metal stiffener and dilator were removed, we confirmed placement with contrast infusion. A coaxial Glidewire and 4 French glide cath were then used to navigate across the obstruction at the stent. Once the catheter were within the duodenum, the wire was removed and contrast confirmed location. A Benson wire was advanced through the system, and the Accustick and Glidewire were removed. Dilation of the subcutaneous tissue tracks was performed with an 8 [place] dilator, and then a 10 [place] biliary drain was placed as an internal/external biliary drain. Small amount of contrast confirmed location. The patient tolerated the procedure well and remained hemodynamically stable throughout. No complications were encountered and no significant blood loss was encountered. FINDINGS: Percutaneous transhepatic cholangiogram demonstrates complete occlusion of the uncovered metal stent  of the common hepatic duct. More proximally there is dilation of the sectoral ducts of anterior and posterior right liver and remnant segment 4 ducts. IMPRESSION: Status post percutaneous transhepatic cholangiogram and placement of internal/external biliary drain via a right-sided intercostal approach. Signed, Dulcy Fanny. Earleen Newport, DO [facility] Radiology Electronically Signed   By: Corrie Mckusick D.O.   On: 01/05/2017 10:51        Scheduled Meds: . atenolol  50 mg Oral Daily  . enoxaparin (LOVENOX) injection  40 mg Subcutaneous Q24H  . famotidine  20 mg Oral Daily  . metoCLOPramide  10 mg Oral TID AC  . pantoprazole  40 mg Oral Daily  . polyethylene  glycol  17 g Oral Daily  . potassium chloride SA  20 mEq Oral Daily  . sodium chloride flush  5 mL Intravenous Q8H  . vitamin B-12  2,500 mcg Oral Daily   Continuous Infusions: . piperacillin-tazobactam (ZOSYN)  IV 3.375 g (01/05/17 0947)     LOS: 0 days    Time spent: 49min    Domenic Polite, MD [facility] Pager [phone]  If 7PM-7AM, please contact night-coverage [email or web address] Password [email or web address] 01/05/2017, 1:52 PM

## 2017-01-05 NOTE — Progress Notes (Signed)
Referring Physician(s): Rande Brunt  Supervising Physician: Markus Daft  Patient Status:  [facility] - In-pt  Chief Complaint: Metastatic colon cancer with biliary obstruction   Subjective: Pt c/o some RUQ discomfort; denies fever, N/V, resp difficulties   Allergies: Other  Medications: Prior to Admission medications   Medication Sig Start Date End Date Taking? Authorizing Provider  acetaminophen (TYLENOL) 325 MG tablet Take 650 mg by mouth every 6 (six) hours as needed for moderate pain or fever.   Yes [provider]  atenolol (TENORMIN) 50 MG tablet Take 1 tablet (50 mg total) by mouth daily. 04/17/16  Yes Ria Bush, MD  Cyanocobalamin (VITAMIN B-12) 2500 MCG SUBL Place 1 tablet under the tongue daily.   Yes [provider]  diazepam (VALIUM) 5 MG tablet Take 5 mg by mouth 3 (three) times daily as needed for muscle spasms.  11/29/16  Yes [provider]  HYDROmorphone (DILAUDID) 2 MG tablet Take 1-2 tablets (2-4 mg total) by mouth every 4 (four) hours as needed for severe pain. Patient taking differently: Take 2 mg by mouth every 2 (two) hours as needed for severe pain.  12/07/16  Yes Ladell Pier, MD  ibuprofen (ADVIL,MOTRIN) 200 MG tablet Take 600 mg by mouth every 6 (six) hours as needed for mild pain.    Yes [provider]  KLOR-CON M20 20 MEQ tablet Take 20 mEq by mouth daily. 11/26/16  Yes [provider]  lidocaine-prilocaine (EMLA) cream Apply 1 application topically as needed (apply to port).   Yes [provider]  metoCLOPramide (REGLAN) 10 MG tablet Take 1 tablet (10 mg total) by mouth 3 (three) times daily before meals. 12/25/16 01/24/17 Yes Owens Shark, NP  omeprazole (PRILOSEC) 40 MG capsule Take 1 capsule (40 mg total) by mouth every other day. Patient taking differently: Take 40 mg by mouth daily.  04/17/16  Yes Ria Bush, MD  ondansetron (ZOFRAN) 8 MG tablet Take 1 tablet (8 mg total) by mouth  every 8 (eight) hours as needed for nausea or vomiting. 11/27/16  Yes Ladell Pier, MD  polyethylene glycol (MIRALAX / GLYCOLAX) packet Take 17 g by mouth daily.   Yes [provider]  ranitidine (ZANTAC) 150 MG tablet Take 150 mg by mouth daily.   Yes [provider]  sildenafil (VIAGRA) 100 MG tablet Take 100 mg by mouth daily as needed for erectile dysfunction. Reported on 06/17/2015   Yes [provider]  ciprofloxacin (CIPRO) 500 MG tablet Take 1 tablet (500 mg total) by mouth 2 (two) times daily. Patient not taking: Reported on 12/30/2016 12/21/16   Milus Banister, MD  prochlorperazine (COMPAZINE) 10 MG tablet Take 1 tablet (10 mg total) by mouth every 6 (six) hours as needed for nausea or vomiting. Patient not taking: Reported on 12/25/2016 11/27/16   Ladell Pier, MD  trifluridine-tipiracil (LONSURF) 20-8.19 MG tablet Take 4 tablets (80 mg of trifluridine total) by mouth 2 (two) times daily after a meal. On days 1-5, 8-12. Repeat every 28days 11/27/16   Ladell Pier, MD     Vital Signs: BP 137/74 (BP Location: Left Arm)   Pulse 80   Temp 98.4 F (36.9 C) (Oral)   Resp 17   Ht '5\' 11"'$  (1.803 m)   Wt 220 lb 10.9 oz (100.1 kg)   SpO2 96%   BMI 30.78 kg/m   Physical Exam RUQ drain intact, output about 150 cc dark bile, insertion site ok, mildly tender, drain  flushed with sterile NS without difficulty  Imaging: Dg Ercp  Result Date: 01/04/2017 CLINICAL DATA:  56 year old male with a 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 He has an internal metal stent which has become occluded. A plastic biliary stent was recently placed and will now be removed prior to percutaneous transhepatic cholangiogram and internal/external biliary drain placement. EXAM: ERCP TECHNIQUE: Multiple spot images obtained with the fluoroscopic device and submitted for interpretation post-procedure. FLUOROSCOPY TIME:  Please see operative note  for detail. COMPARISON:  MRCP 12/24/2016 FINDINGS: Three intraoperative saved images demonstrate a flexible endoscope in the descending duodenum. A plastic biliary stent is noted passing coaxially through a metallic stent. On the final image, the plastic stent is no longer present. IMPRESSION: Successful retrieval of plastic biliary stent. These images were submitted for radiologic interpretation only. Please see the procedural report for the amount of contrast and the fluoroscopy time utilized. Electronically Signed   By: Jacqulynn Cadet M.D.   On: 01/04/2017 11:31   Ir Int Lianne Cure Biliary Drain With Cholangiogram  Result Date: 01/05/2017 INDICATION: 56 year old male with a history of metastatic colon cancer, status post left hepatectomy, with recurrent disease at the liver hilum contributing to biliary obstruction. Prior endoscopy with metal stent placement (uncovered) of the common hepatic duct Oct 15, 2016, with subsequent ERCP and plastic stent placement 12/16/2016. He has had persistent hyperbilirubinemia with concern for ongoing obstruction. He presents for percutaneous transhepatic cholangiogram and drain placement EXAM: PERCUTANEOUS TRANSHEPATIC CHOLANGIOGRAM WITH INTERNAL/ EXTERNAL DRAIN PLACEMENT MEDICATIONS: 25 mg IV Demerol, 4 mg IV Zofran ANESTHESIA/SEDATION: Moderate (conscious) sedation was employed during this procedure. A total of Versed 4.0 mg and Fentanyl 200 mcg was administered intravenously. Moderate Sedation Time: 50 minutes. The patient's level of consciousness and vital signs were monitored continuously by radiology nursing throughout the procedure under my direct supervision. FLUOROSCOPY TIME:  Fluoroscopy Time: 9 minutes 48 seconds (754 mGy). COMPLICATIONS: None PROCEDURE: The procedure, risks, benefits, and alternatives were explained to the patient and the patient's family. A complete informed consent was performed, with risk benefit analysis. Specific risks that were discussed for  the procedure include bleeding, infection, biliary sepsis, IC use day, organ injury, need for further procedure, need for further surgery, long-term drain placement, cardiopulmonary collapse, death. Questions regarding the procedure were encouraged and answered. The patient understands and consents to the procedure. Patient is position in supine position on the fluoroscopy table, and the upper abdomen was prepped and draped in the usual sterile fashion. Maximum barrier sterile technique with sterile gowns and gloves were used for the procedure. A timeout was performed prior to the initiation of the procedure. Local anesthesia was provided with 1% lidocaine with epinephrine. Scout images of the upper abdomen were performed. The image intensifier was angulated, right anterior oblique approximately 10-20 degrees. 1% lidocaine was used for local anesthesia, with generous infiltration of the skin and subcutaneous tissues in and inter left costal location. A Chiba needle was used to access the biliary system, targeting the cranial aspect of the indwelling metallic stent of the common hepatic duct. Once the tip of the needle was confirmed within the biliary system by injecting small aliquots of contrast, images were stored of the biliary system after partially opacifying the biliary tree via the needle. A second access was required for access into the biliary system, from right intercostal location. Once the tip of this needle was confirmed within the biliary system, an 018 wire was advanced centrally. The needle was  removed, a small incision was made with an 11 blade scalpel, and then the inner catheter from a triaxial Accustick system was advanced into the biliary system. This access was determined to be with thin a sectoral duct of the right posterior liver lobe, which enter the common hepatic duct below the cranial stent margin. A third access was required for access into the biliary system, from separate right  intercostal location. Once the tip of this needle was confirmed within the biliary system, an 018 wire was advanced centrally. The needle was removed, a small incision was made with an 11 blade scalpel, and then Accustick system was advanced into the biliary system. The metal stiffener and dilator were removed, we confirmed placement with contrast infusion. A coaxial Glidewire and 4 French glide cath were then used to navigate across the obstruction at the stent. Once the catheter were within the duodenum, the wire was removed and contrast confirmed location. A Benson wire was advanced through the system, and the Accustick and Glidewire were removed. Dilation of the subcutaneous tissue tracks was performed with an 8 [place] dilator, and then a 10 [place] biliary drain was placed as an internal/external biliary drain. Small amount of contrast confirmed location. The patient tolerated the procedure well and remained hemodynamically stable throughout. No complications were encountered and no significant blood loss was encountered. FINDINGS: Percutaneous transhepatic cholangiogram demonstrates complete occlusion of the uncovered metal stent of the common hepatic duct. More proximally there is dilation of the sectoral ducts of anterior and posterior right liver and remnant segment 4 ducts. IMPRESSION: Status post percutaneous transhepatic cholangiogram and placement of internal/external biliary drain via a right-sided intercostal approach. Signed, Dulcy Fanny. Earleen Newport, DO [facility] Radiology Electronically Signed   By: Corrie Mckusick D.O.   On: 01/05/2017 10:51    Labs:  CBC:  Recent Labs  12/21/16 1107 12/25/16 0906 01/04/17 0930 01/05/17 0641  WBC 9.8 3.4* 3.7* 6.4  HGB 10.6* 10.6* 11.5* 11.3*  HCT 30.6* 31.8* 34.2* 33.9*  PLT 389.0 278 145* 130*    COAGS:  Recent Labs  01/09/16 0800 01/04/17 0930  INR 0.92 1.00  APTT 27  --     BMP:  Recent  Labs  12/17/16 0502 12/18/16 0500 12/21/16 1107 12/25/16 1012 01/04/17 0930 01/05/17 0641  NA 135 135 133* 137 140 137  K 3.8 4.1 3.8 4.0 4.0 3.8  CL 101 101 98  --  106 104  CO2 '26 27 26 25 25 26  '$ GLUCOSE 136* 96 132* 110 108* 109*  BUN '8 10 15 '$ 12.'9 17 15  '$ CALCIUM 8.2* 8.5* 8.9 9.4 8.9 8.7*  CREATININE 0.75 0.86 1.36 1.2 1.15 1.15  GFRNONAA >60 >60  --   --  >60 >60  GFRAA >60 >60  --   --  >60 >60    LIVER FUNCTION TESTS:  Recent Labs  12/21/16 1107 12/25/16 1012 01/04/17 0930 01/05/17 0641  BILITOT 6.8* 3.45* 2.3* 4.5*  AST 123* 84* 151* 244*  ALT 101* 92* 121* 196*  ALKPHOS 495* 396* 307* 343*  PROT 7.5 7.6 7.5 7.0  ALBUMIN 3.4* 2.9* 3.4* 3.0*    Assessment and Plan: Met colon cancer with biliary obstruction/obstructed biliary stent ; s/p I/E biliary drain 7/30; AF; WBC /creat nl; t bili 4.5(2.3), hgb 11.3, blood cx neg to date, bile cx pending; cont tid drain NS flushes while in house, once daily as OP, record output, change dressing prn; pt will be set up for  biliary drain exchange next week   Electronically Signed: D. Rowe Toddy, PA-C 01/05/2017, 4:21 PM   I spent a total of 20 minutes at the the patient's bedside AND on the patient's hospital floor or unit, greater than 50% of which was counseling/coordinating care for biliary drain    Patient ID: Bryan Fields, male   DOB: Sep 29, 1960, 56 y.o.   MRN: [id number]

## 2017-01-06 ENCOUNTER — Inpatient Hospital Stay (HOSPITAL_COMMUNITY): Payer: 59

## 2017-01-06 DIAGNOSIS — D61818 Other pancytopenia: Secondary | ICD-10-CM

## 2017-01-06 DIAGNOSIS — R1010 Upper abdominal pain, unspecified: Secondary | ICD-10-CM

## 2017-01-06 LAB — GLUCOSE, CAPILLARY: GLUCOSE-CAPILLARY: 104 mg/dL — AB (ref 65–99)

## 2017-01-06 LAB — CBC
HEMATOCRIT: 36.2 % — AB (ref 39.0–52.0)
Hemoglobin: 12 g/dL — ABNORMAL LOW (ref 13.0–17.0)
MCH: 32.4 pg (ref 26.0–34.0)
MCHC: 33.1 g/dL (ref 30.0–36.0)
MCV: 97.8 fL (ref 78.0–100.0)
PLATELETS: 131 10*3/uL — AB (ref 150–400)
RBC: 3.7 MIL/uL — ABNORMAL LOW (ref 4.22–5.81)
RDW: 17.8 % — AB (ref 11.5–15.5)
WBC: 8.9 10*3/uL (ref 4.0–10.5)

## 2017-01-06 LAB — COMPREHENSIVE METABOLIC PANEL
ALBUMIN: 3.2 g/dL — AB (ref 3.5–5.0)
ALT: 163 U/L — ABNORMAL HIGH (ref 17–63)
AST: 148 U/L — AB (ref 15–41)
Alkaline Phosphatase: 286 U/L — ABNORMAL HIGH (ref 38–126)
Anion gap: 12 (ref 5–15)
BUN: 17 mg/dL (ref 6–20)
CHLORIDE: 103 mmol/L (ref 101–111)
CO2: 22 mmol/L (ref 22–32)
Calcium: 9 mg/dL (ref 8.9–10.3)
Creatinine, Ser: 1.08 mg/dL (ref 0.61–1.24)
GFR calc Af Amer: >60 mL/min (ref >60)
Glucose, Bld: 101 mg/dL — ABNORMAL HIGH (ref 65–99)
POTASSIUM: 3.7 mmol/L (ref 3.5–5.1)
SODIUM: 137 mmol/L (ref 135–145)
TOTAL PROTEIN: 7.6 g/dL (ref 6.5–8.1)
Total Bilirubin: 3.7 mg/dL — ABNORMAL HIGH (ref 0.3–1.2)

## 2017-01-06 MED ORDER — METHOCARBAMOL 500 MG PO TABS
500.0000 mg | ORAL_TABLET | Freq: Four times a day (QID) | ORAL | Status: DC
  Administered 2017-01-06 – 2017-01-12 (×23): 500 mg via ORAL
  Filled 2017-01-06 (×23): qty 1

## 2017-01-06 MED ORDER — NADOLOL 40 MG PO TABS
80.0000 mg | ORAL_TABLET | Freq: Every day | ORAL | Status: DC
  Administered 2017-01-06 – 2017-01-12 (×7): 80 mg via ORAL
  Filled 2017-01-06 (×7): qty 2

## 2017-01-06 MED ORDER — HYDROMORPHONE HCL 2 MG PO TABS
2.0000 mg | ORAL_TABLET | ORAL | Status: DC | PRN
  Administered 2017-01-06 – 2017-01-07 (×5): 4 mg via ORAL
  Filled 2017-01-06 (×4): qty 2

## 2017-01-06 MED ORDER — FAMOTIDINE 20 MG PO TABS
20.0000 mg | ORAL_TABLET | Freq: Two times a day (BID) | ORAL | Status: DC
  Administered 2017-01-06 – 2017-01-12 (×13): 20 mg via ORAL
  Filled 2017-01-06 (×13): qty 1

## 2017-01-06 MED ORDER — MORPHINE SULFATE (PF) 2 MG/ML IV SOLN
2.0000 mg | INTRAVENOUS | Status: DC | PRN
  Administered 2017-01-08 (×2): 2 mg via INTRAVENOUS
  Filled 2017-01-06 (×2): qty 1

## 2017-01-06 MED ORDER — PANTOPRAZOLE SODIUM 40 MG PO TBEC
40.0000 mg | DELAYED_RELEASE_TABLET | Freq: Two times a day (BID) | ORAL | Status: DC
  Administered 2017-01-06 – 2017-01-12 (×13): 40 mg via ORAL
  Filled 2017-01-06 (×13): qty 1

## 2017-01-06 NOTE — Progress Notes (Signed)
[facility] Progress Note  Patient: Bryan Fields YOV:[id number]   PCP: Ria Bush, MD DOB: 02-18-1961   DOA: 01/04/2017   DOS: 01/06/2017   Date of Service: the patient was seen and examined on 01/06/2017  Subjective: Complains about worsening of his pain in the abdomen as compared to yesterday. No nausea. Requiring higher doses of Dilaudid. No evidence of worsening mentation after taking narcotics. Did have 2 loose watery BM this morning. No fever last 24 hours.  Brief hospital course: Pt. with PMH of colon cancer with liver metastasis, S/P partial hepatectomy, biliary obstruction S/P stent placement; admitted on 01/04/2017, presented with complaint of abdominal pain, was found to have bradycardia and fever. Currently further plan is continue IV antibiotics.  Assessment and Plan: 1. Biliary obstruction. Due to metastatic colon cancer to liver. S/P ERCP, no significant improvement in bilirubin. Repeat ERCP on 01/04/2017 with stent removal as well as external biliary drain placement per INR. Bilirubin finally trending down as well as LFTs are also trending down. Patient complaint of increasing pain after right upper quadrant, x-ray chest as well as x-ray abdomen unremarkable for any acute abnormality. Continue with IV antibiotics for now. Pain control with scheduled Robaxin, will continue with Dilaudid 2-4 mg.  2. SIRS. Temperature 102 as well as tachycardia. Blood cultures negative. Leukocytosis is also better. Repeat x-ray shows no acute abnormality Continue with IV antibiotics for now transitioned to by mouth tomorrow.  3. Metastatic colon cancer. Follows up with Dr. Ammie Dalton as an outpatient. Chemotherapy is currently on hold due to obstruction.  4. Essential hypertension. Patient is on nadolol at home as well as atenolol. Provider discontinue atenolol and discharge and continue with nadolol only.  Diet: Regular diet DVT Prophylaxis: subcutaneous Heparin  Advance  goals of care discussion: full code  Family Communication: family was present at bedside, at the time of interview. The pt provided permission to discuss medical plan with the family. Opportunity was given to ask question and all questions were answered satisfactorily.   Disposition:  Discharge to home.  Consultants: gastroenterology, IR Procedures: ERCP, biliary stent placement   Antibiotics: Anti-infectives    Start     Dose/Rate Route Frequency Ordered Stop   01/04/17 1800  piperacillin-tazobactam (ZOSYN) IVPB 4.5 g  Status:  Discontinued     4.5 g 200 mL/hr over 30 Minutes Intravenous Every 6 hours 01/04/17 1641 01/04/17 1733   01/04/17 1800  piperacillin-tazobactam (ZOSYN) IVPB 3.375 g     3.375 g 12.5 mL/hr over 240 Minutes Intravenous Every 8 hours 01/04/17 1734         Objective: Physical Exam: Vitals:   01/05/17 2335 01/06/17 0640 01/06/17 1245 01/06/17 1405  BP:  127/79 115/64 103/60  Pulse:  90 88 80  Resp:  14  16  Temp: 98.5 F (36.9 C) 99 F (37.2 C)  98.4 F (36.9 C)  TempSrc:  Oral  Oral  SpO2:  98%  96%  Weight:  101.5 kg (223 lb 12.3 oz)    Height:        Intake/Output Summary (Last 24 hours) at 01/06/17 1459 Last data filed at 01/06/17 1405  Gross per 24 hour  Intake              605 ml  Output              200 ml  Net              405 ml   [employer]  01/04/17 1815 01/05/17 0454 01/06/17 0640  Weight: 100.5 kg (221 lb 9 oz) 100.1 kg (220 lb 10.9 oz) 101.5 kg (223 lb 12.3 oz)   General: Alert, Awake and Oriented to Time, Place and Person. Appear in moderate distress, affect appropriate Eyes: PERRL, Conjunctiva normal ENT: Oral Mucosa clear moist. Neck: no JVD, no Abnormal Mass Or lumps Cardiovascular: S1 and S2 Present, no Murmur, Peripheral Pulses Present Respiratory: normal respiratory effort, Bilateral Air entry equal and Decreased, no use of accessory muscle, Clear to Auscultation, no Crackles, no wheezes Abdomen: Bowel Sound present,  Soft and RUQ tenderness, no hernia Skin: no redness, no Rash, no induration Extremities: trace Pedal edema, no calf tenderness Neurologic: Grossly no focal neuro deficit. Bilaterally Equal motor strength  Data Reviewed: CBC:  Recent Labs Lab 01/04/17 0930 01/05/17 0641 01/06/17 0520  WBC 3.7* 6.4 8.9  NEUTROABS 2.5  --   --   HGB 11.5* 11.3* 12.0*  HCT 34.2* 33.9* 36.2*  MCV 97.2 98.8 97.8  PLT 145* 130* 409*   Basic Metabolic Panel:  Recent Labs Lab 01/04/17 0930 01/05/17 0641 01/06/17 0520  NA 140 137 137  K 4.0 3.8 3.7  CL 106 104 103  CO2 25 26 22   GLUCOSE 108* 109* 101*  BUN 17 15 17   CREATININE 1.15 1.15 1.08  CALCIUM 8.9 8.7* 9.0    Liver Function Tests:  Recent Labs Lab 01/04/17 0930 01/05/17 0641 01/06/17 0520  AST 151* 244* 148*  ALT 121* 196* 163*  ALKPHOS 307* 343* 286*  BILITOT 2.3* 4.5* 3.7*  PROT 7.5 7.0 7.6  ALBUMIN 3.4* 3.0* 3.2*   No results for input(s): LIPASE, AMYLASE in the last 168 hours. No results for input(s): AMMONIA in the last 168 hours. Coagulation Profile:  Recent Labs Lab 01/04/17 0930  INR 1.00   Cardiac Enzymes: No results for input(s): CKTOTAL, CKMB, CKMBINDEX, TROPONINI in the last 168 hours. BNP (last 3 results) No results for input(s): PROBNP in the last 8760 hours. CBG:  Recent Labs Lab 01/05/17 0714 01/06/17 0804  GLUCAP 95 104*   Studies: Dg Chest Port 1 View  Result Date: 01/06/2017 CLINICAL DATA:  Right upper quadrant pain since ERCP performed yesterday. EXAM: PORTABLE CHEST 1 VIEW COMPARISON:  ERCP and biliary stent retrieval -01/04/2017; fluoroscopic guided percutaneous biliary drainage catheter placement -01/04/2017 ; abdominal radiograph - earlier same day; acute abdominal radiographic series - 12/08/2016 FINDINGS: Grossly unchanged cardiac silhouette and mediastinal contours given slightly reduced lung volumes. Stable position of support apparatus. Left basilar heterogeneous opacities are  unchanged in favored to represent atelectasis or scar. No new focal airspace opacities. No pleural effusion or pneumothorax. No evidence of edema. No acute osseus abnormalities. Surgical clips overlying the midline of the upper abdomen. Percutaneous biliary drainage catheter overlies right upper abdominal quadrant as does internal biliary stent. IMPRESSION: Decreased lung volumes without superimposed acute cardiopulmonary disease. Electronically Signed   By: Sandi Mariscal M.D.   On: 01/06/2017 14:12   Dg Abd Portable 1v  Result Date: 01/06/2017 CLINICAL DATA:  Post internal biliary stent retrieval and placement of percutaneous biliary drainage catheter. EXAM: PORTABLE ABDOMEN - 1 VIEW COMPARISON:  Fluoroscopic guided biliary drainage catheter placement - 01/04/2017 ; ERCP - 01/04/2017 FINDINGS: Right hepatic approach percutaneous biliary drainage catheter tip overlies expected location of the descending portion of the duodenum with the mid/distal aspect overlying the internal biliary stent. Appearance is unchanged compared to completion images performed yesterday. Paucity of bowel gas without evidence of enteric obstruction. Nondiagnostic  evaluation for pneumoperitoneum secondary supine positioning and exclusion of the lower thorax. No pneumatosis or portal venous gas. No definitive abnormal intra-abdominal calcifications No acute osseus abnormalities. IMPRESSION: 1. Grossly unchanged positioning of trans hepatic percutaneous biliary drainage catheter overlying internal biliary stent. 2. Nonobstructive bowel gas pattern. Electronically Signed   By: Sandi Mariscal M.D.   On: 01/06/2017 14:15    Scheduled Meds: . enoxaparin (LOVENOX) injection  40 mg Subcutaneous Q24H  . famotidine  20 mg Oral BID  . methocarbamol  500 mg Oral QID  . metoCLOPramide  10 mg Oral TID AC  . nadolol  80 mg Oral Daily  . pantoprazole  40 mg Oral Q12H  . polyethylene glycol  17 g Oral Daily  . potassium chloride SA  20 mEq Oral Daily   . sodium chloride flush  5 mL Intravenous Q8H  . vitamin B-12  2,500 mcg Oral Daily   Continuous Infusions: . piperacillin-tazobactam (ZOSYN)  IV Stopped (01/06/17 1359)   PRN Meds: bisacodyl, diazepam, HYDROmorphone, ondansetron **OR** ondansetron (ZOFRAN) IV, senna-docusate  Time spent: 35 minutes  Author: Berle Mull, MD [facility] Pager: [phone] 01/06/2017 2:59 PM  If 7PM-7AM, please contact night-coverage at [email or web address], password [facility]

## 2017-01-06 NOTE — Progress Notes (Signed)
Patient ID: Bryan Fields, male   DOB: 1960/11/13, 56 y.o.   MRN: [id number]    Referring Physician(s): Rande Brunt  Supervising Physician: Arne Cleveland  Patient Status:  [facility] - In-pt  Chief Complaint:  Metastatic colon cancer with biliary obstruction  Subjective:  Pt still c/o rt lat abd pain- 5/10, intermittent nausea, no vomiting; he is splinting secondary to prob intercostal/diaphragmatic irritation from drain   Allergies: Other  Medications: Prior to Admission medications   Medication Sig Start Date End Date Taking? Authorizing Provider  acetaminophen (TYLENOL) 325 MG tablet Take 650 mg by mouth every 6 (six) hours as needed for moderate pain or fever.   Yes [provider]  atenolol (TENORMIN) 50 MG tablet Take 1 tablet (50 mg total) by mouth daily. 04/17/16  Yes Ria Bush, MD  Cyanocobalamin (VITAMIN B-12) 2500 MCG SUBL Place 1 tablet under the tongue daily.   Yes [provider]  diazepam (VALIUM) 5 MG tablet Take 5 mg by mouth 3 (three) times daily as needed for muscle spasms.  11/29/16  Yes [provider]  HYDROmorphone (DILAUDID) 2 MG tablet Take 1-2 tablets (2-4 mg total) by mouth every 4 (four) hours as needed for severe pain. Patient taking differently: Take 2 mg by mouth every 2 (two) hours as needed for severe pain.  12/07/16  Yes Ladell Pier, MD  ibuprofen (ADVIL,MOTRIN) 200 MG tablet Take 600 mg by mouth every 6 (six) hours as needed for mild pain.    Yes [provider]  KLOR-CON M20 20 MEQ tablet Take 20 mEq by mouth daily. 11/26/16  Yes [provider]  lidocaine-prilocaine (EMLA) cream Apply 1 application topically as needed (apply to port).   Yes [provider]  metoCLOPramide (REGLAN) 10 MG tablet Take 1 tablet (10 mg total) by mouth 3 (three) times daily before meals. 12/25/16 01/24/17 Yes Owens Shark, NP  omeprazole (PRILOSEC) 40 MG capsule Take 1 capsule (40 mg total) by mouth every other  day. Patient taking differently: Take 40 mg by mouth daily.  04/17/16  Yes Ria Bush, MD  ondansetron (ZOFRAN) 8 MG tablet Take 1 tablet (8 mg total) by mouth every 8 (eight) hours as needed for nausea or vomiting. 11/27/16  Yes Ladell Pier, MD  polyethylene glycol (MIRALAX / GLYCOLAX) packet Take 17 g by mouth daily.   Yes [provider]  ranitidine (ZANTAC) 150 MG tablet Take 150 mg by mouth daily.   Yes [provider]  sildenafil (VIAGRA) 100 MG tablet Take 100 mg by mouth daily as needed for erectile dysfunction. Reported on 06/17/2015   Yes [provider]  ciprofloxacin (CIPRO) 500 MG tablet Take 1 tablet (500 mg total) by mouth 2 (two) times daily. Patient not taking: Reported on 12/30/2016 12/21/16   Milus Banister, MD  prochlorperazine (COMPAZINE) 10 MG tablet Take 1 tablet (10 mg total) by mouth every 6 (six) hours as needed for nausea or vomiting. Patient not taking: Reported on 12/25/2016 11/27/16   Ladell Pier, MD  trifluridine-tipiracil (LONSURF) 20-8.19 MG tablet Take 4 tablets (80 mg of trifluridine total) by mouth 2 (two) times daily after a meal. On days 1-5, 8-12. Repeat every 28days 11/27/16   Ladell Pier, MD     Vital Signs: BP 103/60 (BP Location: Left Arm)   Pulse 80   Temp 98.4 F (36.9 C) (Oral)   Resp 16   Ht _0  (1.803 m)   Wt 223 lb 12.3 oz (  101.5 kg)   SpO2 96%   BMI 31.21 kg/m   Physical Exam rt biliary drain intact, insertion site with old blood, no acute bleeding, mildly tender,   , output 200 cc dark green bile, drain flushed without difficulty  Imaging: Dg Chest Port 1 View  Result Date: 01/06/2017 CLINICAL DATA:  Right upper quadrant pain since ERCP performed yesterday. EXAM: PORTABLE CHEST 1 VIEW COMPARISON:  ERCP and biliary stent retrieval -01/04/2017; fluoroscopic guided percutaneous biliary drainage catheter placement -01/04/2017 ; abdominal radiograph - earlier same day; acute abdominal  radiographic series - 12/08/2016 FINDINGS: Grossly unchanged cardiac silhouette and mediastinal contours given slightly reduced lung volumes. Stable position of support apparatus. Left basilar heterogeneous opacities are unchanged in favored to represent atelectasis or scar. No new focal airspace opacities. No pleural effusion or pneumothorax. No evidence of edema. No acute osseus abnormalities. Surgical clips overlying the midline of the upper abdomen. Percutaneous biliary drainage catheter overlies right upper abdominal quadrant as does internal biliary stent. IMPRESSION: Decreased lung volumes without superimposed acute cardiopulmonary disease. Electronically Signed   By: Sandi Mariscal M.D.   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 He has an internal metal stent which has become occluded. A plastic biliary stent was recently placed and will now be removed prior to percutaneous transhepatic cholangiogram and internal/external biliary drain placement. EXAM: ERCP TECHNIQUE: Multiple spot images obtained with the fluoroscopic device and submitted for interpretation post-procedure. FLUOROSCOPY TIME:  Please see operative note for detail. COMPARISON:  MRCP 12/24/2016 FINDINGS: Three intraoperative saved images demonstrate a flexible endoscope in the descending duodenum. A plastic biliary stent is noted passing coaxially through a metallic stent. On the final image, the plastic stent is no longer present. IMPRESSION: Successful retrieval of plastic biliary stent. These images were submitted for radiologic interpretation only. Please see the procedural report for the amount of contrast and the fluoroscopy time utilized. Electronically Signed   By: Jacqulynn Cadet M.D.   On: 01/04/2017 11:31   Dg Abd Portable 1v  Result Date: 01/06/2017 CLINICAL DATA:  Post internal biliary stent  retrieval and placement of percutaneous biliary drainage catheter. EXAM: PORTABLE ABDOMEN - 1 VIEW COMPARISON:  Fluoroscopic guided biliary drainage catheter placement - 01/04/2017 ; ERCP - 01/04/2017 FINDINGS: Right hepatic approach percutaneous biliary drainage catheter tip overlies expected location of the descending portion of the duodenum with the mid/distal aspect overlying the internal biliary stent. Appearance is unchanged compared to completion images performed yesterday. Paucity of bowel gas without evidence of enteric obstruction. Nondiagnostic evaluation for pneumoperitoneum secondary supine positioning and exclusion of the lower thorax. No pneumatosis or portal venous gas. No definitive abnormal intra-abdominal calcifications No acute osseus abnormalities. IMPRESSION: 1. Grossly unchanged positioning of trans hepatic percutaneous biliary drainage catheter overlying internal biliary stent. 2. Nonobstructive bowel gas pattern. Electronically Signed   By: Sandi Mariscal M.D.   On: 01/06/2017 14:15   Ir Int Lianne Cure Biliary Drain With Cholangiogram  Result Date: 01/05/2017 INDICATION: 56 year old male with a history of metastatic colon cancer, status post left hepatectomy, with recurrent disease at the liver hilum contributing to biliary obstruction. Prior endoscopy with metal stent placement (uncovered) of the common hepatic duct Oct 15, 2016, with subsequent ERCP and plastic stent placement 12/16/2016. He has had persistent hyperbilirubinemia with concern for ongoing obstruction. He presents for percutaneous transhepatic cholangiogram and drain placement EXAM: PERCUTANEOUS TRANSHEPATIC CHOLANGIOGRAM  WITH INTERNAL/ EXTERNAL DRAIN PLACEMENT MEDICATIONS: 25 mg IV Demerol, 4 mg IV Zofran ANESTHESIA/SEDATION: Moderate (conscious) sedation was employed during this procedure. A total of Versed 4.0 mg and Fentanyl 200 mcg was administered intravenously. Moderate Sedation Time: 50 minutes. The patient's level of  consciousness and vital signs were monitored continuously by radiology nursing throughout the procedure under my direct supervision. FLUOROSCOPY TIME:  Fluoroscopy Time: 9 minutes 48 seconds (754 mGy). COMPLICATIONS: None PROCEDURE: The procedure, risks, benefits, and alternatives were explained to the patient and the patient's family. A complete informed consent was performed, with risk benefit analysis. Specific risks that were discussed for the procedure include bleeding, infection, biliary sepsis, IC use day, organ injury, need for further procedure, need for further surgery, long-term drain placement, cardiopulmonary collapse, death. Questions regarding the procedure were encouraged and answered. The patient understands and consents to the procedure. Patient is position in supine position on the fluoroscopy table, and the upper abdomen was prepped and draped in the usual sterile fashion. Maximum barrier sterile technique with sterile gowns and gloves were used for the procedure. A timeout was performed prior to the initiation of the procedure. Local anesthesia was provided with 1% lidocaine with epinephrine. Scout images of the upper abdomen were performed. The image intensifier was angulated, right anterior oblique approximately 10-20 degrees. 1% lidocaine was used for local anesthesia, with generous infiltration of the skin and subcutaneous tissues in and inter left costal location. A Chiba needle was used to access the biliary system, targeting the cranial aspect of the indwelling metallic stent of the common hepatic duct. Once the tip of the needle was confirmed within the biliary system by injecting small aliquots of contrast, images were stored of the biliary system after partially opacifying the biliary tree via the needle. A second access was required for access into the biliary system, from right intercostal location. Once the tip of this needle was confirmed within the biliary system, an 018 wire was  advanced centrally. The needle was removed, a small incision was made with an 11 blade scalpel, and then the inner catheter from a triaxial Accustick system was advanced into the biliary system. This access was determined to be with thin a sectoral duct of the right posterior liver lobe, which enter the common hepatic duct below the cranial stent margin. A third access was required for access into the biliary system, from separate right intercostal location. Once the tip of this needle was confirmed within the biliary system, an 018 wire was advanced centrally. The needle was removed, a small incision was made with an 11 blade scalpel, and then Accustick system was advanced into the biliary system. The metal stiffener and dilator were removed, we confirmed placement with contrast infusion. A coaxial Glidewire and 4 French glide cath were then used to navigate across the obstruction at the stent. Once the catheter were within the duodenum, the wire was removed and contrast confirmed location. A Benson wire was advanced through the system, and the Accustick and Glidewire were removed. Dilation of the subcutaneous tissue tracks was performed with an 8 [place] dilator, and then a 10 [place] biliary drain was placed as an internal/external biliary drain. Small amount of contrast confirmed location. The patient tolerated the procedure well and remained hemodynamically stable throughout. No complications were encountered and no significant blood loss was encountered. FINDINGS: Percutaneous transhepatic cholangiogram demonstrates complete occlusion of the uncovered metal stent of the common hepatic duct. More proximally there is dilation of the sectoral ducts of  anterior and posterior right liver and remnant segment 4 ducts. IMPRESSION: Status post percutaneous transhepatic cholangiogram and placement of internal/external biliary drain via a right-sided intercostal approach. Signed, Dulcy Fanny. Earleen Newport, DO [facility] Electronically Signed   By: Corrie Mckusick D.O.   On: 01/05/2017 10:51    Labs:  CBC:  Recent Labs  12/25/16 0906 01/04/17 0930 01/05/17 0641 01/06/17 0520  WBC 3.4* 3.7* 6.4 8.9  HGB 10.6* 11.5* 11.3* 12.0*  HCT 31.8* 34.2* 33.9* 36.2*  PLT 278 145* 130* 131*    COAGS:  Recent Labs  01/09/16 0800 01/04/17 0930  INR 0.92 1.00  APTT 27  --     BMP:  Recent Labs  12/18/16 0500 12/21/16 1107 12/25/16 1012 01/04/17 0930 01/05/17 0641 01/06/17 0520  NA 135 133* 137 140 137 137  K 4.1 3.8 4.0 4.0 3.8 3.7  CL 101 98  --  106 104 103  CO2 _0 GLUCOSE 96 132* 110 108* 109* 101*  BUN 10 15 12._1 CALCIUM 8.5* 8.9 9.4 8.9 8.7* 9.0  CREATININE 0.86 1.36 1.2 1.15 1.15 1.08  GFRNONAA >60  --   --  >60 >60 >60  GFRAA >60  --   --  >60 >60 >60    LIVER FUNCTION TESTS:  Recent Labs  12/25/16 1012 01/04/17 0930 01/05/17 0641 01/06/17 0520  BILITOT 3.45* 2.3* 4.5* 3.7*  AST 84* 151* 244* 148*  ALT 92* 121* 196* 163*  ALKPHOS 396* 307* 343* 286*  PROT 7.6 7.5 7.0 7.6  ALBUMIN 2.9* 3.4* 3.0* 3.2*    Assessment and Plan: Met colon cancer with biliary obstruction/obstructed biliary stent ; s/p I/E biliary drain 7/30; AF; WBC /creat/K nl; t bili 3.7(4.5), plts 131 k,hgb stable, blood cx neg to date, bile cx - mod staph hemolyticus, sens pend; plain films of chest/abd today unremarkable/no acute process; pt appears to have splinting from intercostal/ diaphragmatic irritation secondary to drain; drain irrigates well with no obvious leak; cont current tx for now; pt will tent be set up for drain exchange next week; will update Dr. Earleen Newport on 8/2 and make any further recommendations afterwards   Electronically Signed: D. Rowe Azriel, PA-C 01/06/2017, 5:46 PM   I spent a total of 20 minutes at the the patient's bedside AND on the patient's hospital floor or unit, greater than 50% of which was  counseling/coordinating care for biliary drain

## 2017-01-06 NOTE — Progress Notes (Signed)
Progress Note   Subjective  Chief Complaint: liver cancer, abnormal bile duct-s/p ERCP with removal of plastic stent (metal stent still in place) and IR placement of Int/Ext biliary drain 01/04/17  This morning the patient tells me that he is in more pain than he was yesterday in his RUQ, this is worse if he gets up and walks around, and in fact, he made two half laps around the unit yesterday. Patient feels like he over did it. He has also started with some looser stools today as well as indigestion which is uncontrolled. He was able to eat his regular diet yesterday. Patient tells me he is not ready to go home today.    Objective   Vital signs in last 24 hours: Temp:  [98.4 F (36.9 C)-100.2 F (37.9 C)] 99 F (37.2 C) (08/01 0640) Pulse Rate:  [78-90] 90 (08/01 0640) Resp:  [12-17] 14 (08/01 0640) BP: (108-137)/(61-79) 127/79 (08/01 0640) SpO2:  [96 %-98 %] 98 % (08/01 0640) Weight:  [223 lb 12.3 oz (101.5 kg)] 223 lb 12.3 oz (101.5 kg) (08/01 0640) Last BM Date: 01/06/17 General:    Caucasian male in NAD Heart:  Regular rate and rhythm; no murmurs Lungs: Respirations even and unlabored, lungs CTA bilaterally Abdomen:  Soft, moderate right upper quadrant tenderness, biliary stent in place with clean bandages and drainage of bilious material and nondistended. Normal bowel sounds. Extremities:  Without edema. Neurologic:  Alert and oriented,  grossly normal neurologically. Psych:  Cooperative. Normal mood and affect.  Intake/Output from previous day: 07/31 0701 - 08/01 0700 In: 965 [P.O.:960; I.V.:5] Out: 250 [Urine:250] Intake/Output this shift: Total I/O In: 240 [P.O.:240] Out: 200 [Drains:200]  Lab Results:  Recent Labs  01/04/17 0930 01/05/17 0641 01/06/17 0520  WBC 3.7* 6.4 8.9  HGB 11.5* 11.3* 12.0*  HCT 34.2* 33.9* 36.2*  PLT 145* 130* 131*   BMET  Recent Labs  01/04/17 0930 01/05/17 0641 01/06/17 0520  NA 140 137 137  K 4.0 3.8 3.7  CL 106 104  103  CO2 25 26 22   GLUCOSE 108* 109* 101*  BUN 17 15 17   CREATININE 1.15 1.15 1.08  CALCIUM 8.9 8.7* 9.0   LFT  Recent Labs  01/06/17 0520  PROT 7.6  ALBUMIN 3.2*  AST 148*  ALT 163*  ALKPHOS 286*  BILITOT 3.7*   PT/INR  Recent Labs  01/04/17 0930  LABPROT 13.2  INR 1.00    Studies/Results: Dg Ercp  Result Date: 01/04/2017 CLINICAL DATA:  56 year old male with a 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 He has an internal metal stent which has become occluded. A plastic biliary stent was recently placed and will now be removed prior to percutaneous transhepatic cholangiogram and internal/external biliary drain placement. EXAM: ERCP TECHNIQUE: Multiple spot images obtained with the fluoroscopic device and submitted for interpretation post-procedure. FLUOROSCOPY TIME:  Please see operative note for detail. COMPARISON:  MRCP 12/24/2016 FINDINGS: Three intraoperative saved images demonstrate a flexible endoscope in the descending duodenum. A plastic biliary stent is noted passing coaxially through a metallic stent. On the final image, the plastic stent is no longer present. IMPRESSION: Successful retrieval of plastic biliary stent. These images were submitted for radiologic interpretation only. Please see the procedural report for the amount of contrast and the fluoroscopy time utilized. Electronically Signed   By: Jacqulynn Cadet M.D.   On: 01/04/2017 11:31   Ir Int Lianne Cure Biliary Drain With Cholangiogram  Result Date:  01/05/2017 INDICATION: 56 year old male with a history of metastatic colon cancer, status post left hepatectomy, with recurrent disease at the liver hilum contributing to biliary obstruction. Prior endoscopy with metal stent placement (uncovered) of the common hepatic duct Oct 15, 2016, with subsequent ERCP and plastic stent placement 12/16/2016. He has had persistent hyperbilirubinemia with concern for ongoing obstruction. He  presents for percutaneous transhepatic cholangiogram and drain placement EXAM: PERCUTANEOUS TRANSHEPATIC CHOLANGIOGRAM WITH INTERNAL/ EXTERNAL DRAIN PLACEMENT MEDICATIONS: 25 mg IV Demerol, 4 mg IV Zofran ANESTHESIA/SEDATION: Moderate (conscious) sedation was employed during this procedure. A total of Versed 4.0 mg and Fentanyl 200 mcg was administered intravenously. Moderate Sedation Time: 50 minutes. The patient's level of consciousness and vital signs were monitored continuously by radiology nursing throughout the procedure under my direct supervision. FLUOROSCOPY TIME:  Fluoroscopy Time: 9 minutes 48 seconds (754 mGy). COMPLICATIONS: None PROCEDURE: The procedure, risks, benefits, and alternatives were explained to the patient and the patient's family. A complete informed consent was performed, with risk benefit analysis. Specific risks that were discussed for the procedure include bleeding, infection, biliary sepsis, IC use day, organ injury, need for further procedure, need for further surgery, long-term drain placement, cardiopulmonary collapse, death. Questions regarding the procedure were encouraged and answered. The patient understands and consents to the procedure. Patient is position in supine position on the fluoroscopy table, and the upper abdomen was prepped and draped in the usual sterile fashion. Maximum barrier sterile technique with sterile gowns and gloves were used for the procedure. A timeout was performed prior to the initiation of the procedure. Local anesthesia was provided with 1% lidocaine with epinephrine. Scout images of the upper abdomen were performed. The image intensifier was angulated, right anterior oblique approximately 10-20 degrees. 1% lidocaine was used for local anesthesia, with generous infiltration of the skin and subcutaneous tissues in and inter left costal location. A Chiba needle was used to access the biliary system, targeting the cranial aspect of the indwelling  metallic stent of the common hepatic duct. Once the tip of the needle was confirmed within the biliary system by injecting small aliquots of contrast, images were stored of the biliary system after partially opacifying the biliary tree via the needle. A second access was required for access into the biliary system, from right intercostal location. Once the tip of this needle was confirmed within the biliary system, an 018 wire was advanced centrally. The needle was removed, a small incision was made with an 11 blade scalpel, and then the inner catheter from a triaxial Accustick system was advanced into the biliary system. This access was determined to be with thin a sectoral duct of the right posterior liver lobe, which enter the common hepatic duct below the cranial stent margin. A third access was required for access into the biliary system, from separate right intercostal location. Once the tip of this needle was confirmed within the biliary system, an 018 wire was advanced centrally. The needle was removed, a small incision was made with an 11 blade scalpel, and then Accustick system was advanced into the biliary system. The metal stiffener and dilator were removed, we confirmed placement with contrast infusion. A coaxial Glidewire and 4 French glide cath were then used to navigate across the obstruction at the stent. Once the catheter were within the duodenum, the wire was removed and contrast confirmed location. A Benson wire was advanced through the system, and the Accustick and Glidewire were removed. Dilation of the subcutaneous tissue tracks was performed with an  8 [place] dilator, and then a 10 [place] biliary drain was placed as an internal/external biliary drain. Small amount of contrast confirmed location. The patient tolerated the procedure well and remained hemodynamically stable throughout. No complications were encountered and no significant blood loss was encountered. FINDINGS: Percutaneous  transhepatic cholangiogram demonstrates complete occlusion of the uncovered metal stent of the common hepatic duct. More proximally there is dilation of the sectoral ducts of anterior and posterior right liver and remnant segment 4 ducts. IMPRESSION: Status post percutaneous transhepatic cholangiogram and placement of internal/external biliary drain via a right-sided intercostal approach. Signed, Dulcy Fanny. Earleen Newport, DO [facility] Radiology Electronically Signed   By: Corrie Mckusick D.O.   On: 01/05/2017 10:51       Assessment / Plan:   Assessment: 1. Biliary obstruction: ERCP procedure 01/04/17 followed by replacement of internal/external biliary drain, patient in worsening right upper quadrant pain this morning, Bili is trending back down, 4.5 to 3.7 2. Colon cancer with metastases 3. Pancytopenia 4. GERD  Plan: 1. Continue regular diet 2. Patient's pain is currently not controlled and he wishes to stay another day to figure this out 3. Continue to appreciate IR's recommendations regarding drain, some concerns from the family that all of the nursing staff do not know how to manage this 4. Changed patient's medications to help him with reflux. Ordered Pantoprazole 40 mg to be given twice daily, once at 8:00 am and once at 6 PM before dinner, as well as Pepcid  We will continue following along   LOS: 1 day   Levin Erp  01/06/2017, 10:41 AM  Pager # [phone]   Attending physician's note   I have taken an interval history, reviewed the chart and examined the patient. I agree with the Advanced Practitioner's note, impression and recommendations.   Damaris Hippo, MD [phone] Mon-Fri 8a-5p [phone] after 5p, weekends, holidays

## 2017-01-07 ENCOUNTER — Other Ambulatory Visit: Payer: Self-pay | Admitting: *Deleted

## 2017-01-07 DIAGNOSIS — C229 Malignant neoplasm of liver, not specified as primary or secondary: Secondary | ICD-10-CM

## 2017-01-07 DIAGNOSIS — C189 Malignant neoplasm of colon, unspecified: Secondary | ICD-10-CM

## 2017-01-07 LAB — CBC WITH DIFFERENTIAL/PLATELET
BASOS ABS: 0 10*3/uL (ref 0.0–0.1)
BASOS PCT: 0 %
Eosinophils Absolute: 0.3 10*3/uL (ref 0.0–0.7)
Eosinophils Relative: 6 %
HEMATOCRIT: 33.8 % — AB (ref 39.0–52.0)
HEMOGLOBIN: 11.3 g/dL — AB (ref 13.0–17.0)
LYMPHS PCT: 24 %
Lymphs Abs: 1.2 10*3/uL (ref 0.7–4.0)
MCH: 32.9 pg (ref 26.0–34.0)
MCHC: 33.4 g/dL (ref 30.0–36.0)
MCV: 98.5 fL (ref 78.0–100.0)
Monocytes Absolute: 0.6 10*3/uL (ref 0.1–1.0)
Monocytes Relative: 12 %
NEUTROS ABS: 2.9 10*3/uL (ref 1.7–7.7)
NEUTROS PCT: 58 %
Platelets: 145 10*3/uL — ABNORMAL LOW (ref 150–400)
RBC: 3.43 MIL/uL — AB (ref 4.22–5.81)
RDW: 17.7 % — AB (ref 11.5–15.5)
WBC: 5.1 10*3/uL (ref 4.0–10.5)

## 2017-01-07 LAB — COMPREHENSIVE METABOLIC PANEL
ALK PHOS: 225 U/L — AB (ref 38–126)
ALT: 113 U/L — ABNORMAL HIGH (ref 17–63)
ANION GAP: 8 (ref 5–15)
AST: 69 U/L — ABNORMAL HIGH (ref 15–41)
Albumin: 3.1 g/dL — ABNORMAL LOW (ref 3.5–5.0)
BILIRUBIN TOTAL: 2.7 mg/dL — AB (ref 0.3–1.2)
BUN: 18 mg/dL (ref 6–20)
CALCIUM: 8.9 mg/dL (ref 8.9–10.3)
CO2: 26 mmol/L (ref 22–32)
Chloride: 100 mmol/L — ABNORMAL LOW (ref 101–111)
Creatinine, Ser: 1.04 mg/dL (ref 0.61–1.24)
GFR calc non Af Amer: >60 mL/min (ref >60)
Glucose, Bld: 103 mg/dL — ABNORMAL HIGH (ref 65–99)
POTASSIUM: 4.1 mmol/L (ref 3.5–5.1)
SODIUM: 134 mmol/L — AB (ref 135–145)
TOTAL PROTEIN: 7.6 g/dL (ref 6.5–8.1)

## 2017-01-07 LAB — MAGNESIUM: Magnesium: 1.8 mg/dL (ref 1.7–2.4)

## 2017-01-07 LAB — GLUCOSE, CAPILLARY: Glucose-Capillary: 89 mg/dL (ref 65–99)

## 2017-01-07 LAB — AMYLASE: Amylase: 16 U/L — ABNORMAL LOW (ref 28–100)

## 2017-01-07 MED ORDER — OXYCODONE HCL ER 10 MG PO T12A
10.0000 mg | EXTENDED_RELEASE_TABLET | Freq: Two times a day (BID) | ORAL | Status: DC
  Administered 2017-01-07 (×2): 10 mg via ORAL
  Filled 2017-01-07 (×2): qty 1

## 2017-01-07 MED ORDER — HYDROMORPHONE HCL 2 MG PO TABS
2.0000 mg | ORAL_TABLET | ORAL | Status: DC | PRN
  Administered 2017-01-07 – 2017-01-08 (×3): 2 mg via ORAL
  Filled 2017-01-07 (×3): qty 1

## 2017-01-07 NOTE — Progress Notes (Signed)
Patient ID: Bryan Fields, male   DOB: 1961-03-24, 56 y.o.   MRN: [id number]    Referring Physician(s): Dr. Owens Loffler  Supervising Physician: Corrie Mckusick  Patient Status: [facility] - In-pt  Chief Complaint: Biliary obstruction  Subjective: Patient having a slightly better day today in terms of pain from the drain.    Allergies: Other  Medications: Prior to Admission medications   Medication Sig Start Date End Date Taking? Authorizing Provider  acetaminophen (TYLENOL) 325 MG tablet Take 650 mg by mouth every 6 (six) hours as needed for moderate pain or fever.   Yes [provider]  atenolol (TENORMIN) 50 MG tablet Take 1 tablet (50 mg total) by mouth daily. 04/17/16  Yes Ria Bush, MD  Cyanocobalamin (VITAMIN B-12) 2500 MCG SUBL Place 1 tablet under the tongue daily.   Yes [provider]  diazepam (VALIUM) 5 MG tablet Take 5 mg by mouth 3 (three) times daily as needed for muscle spasms.  11/29/16  Yes [provider]  HYDROmorphone (DILAUDID) 2 MG tablet Take 1-2 tablets (2-4 mg total) by mouth every 4 (four) hours as needed for severe pain. Patient taking differently: Take 2 mg by mouth every 2 (two) hours as needed for severe pain.  12/07/16  Yes Ladell Pier, MD  ibuprofen (ADVIL,MOTRIN) 200 MG tablet Take 600 mg by mouth every 6 (six) hours as needed for mild pain.    Yes [provider]  KLOR-CON M20 20 MEQ tablet Take 20 mEq by mouth daily. 11/26/16  Yes [provider]  lidocaine-prilocaine (EMLA) cream Apply 1 application topically as needed (apply to port).   Yes [provider]  metoCLOPramide (REGLAN) 10 MG tablet Take 1 tablet (10 mg total) by mouth 3 (three) times daily before meals. 12/25/16 01/24/17 Yes Owens Shark, NP  omeprazole (PRILOSEC) 40 MG capsule Take 1 capsule (40 mg total) by mouth every other day. Patient taking differently: Take 40 mg by mouth daily.  04/17/16  Yes Ria Bush, MD    ondansetron (ZOFRAN) 8 MG tablet Take 1 tablet (8 mg total) by mouth every 8 (eight) hours as needed for nausea or vomiting. 11/27/16  Yes Ladell Pier, MD  polyethylene glycol (MIRALAX / GLYCOLAX) packet Take 17 g by mouth daily.   Yes [provider]  ranitidine (ZANTAC) 150 MG tablet Take 150 mg by mouth daily.   Yes [provider]  sildenafil (VIAGRA) 100 MG tablet Take 100 mg by mouth daily as needed for erectile dysfunction. Reported on 06/17/2015   Yes [provider]  ciprofloxacin (CIPRO) 500 MG tablet Take 1 tablet (500 mg total) by mouth 2 (two) times daily. Patient not taking: Reported on 12/30/2016 12/21/16   Milus Banister, MD  prochlorperazine (COMPAZINE) 10 MG tablet Take 1 tablet (10 mg total) by mouth every 6 (six) hours as needed for nausea or vomiting. Patient not taking: Reported on 12/25/2016 11/27/16   Ladell Pier, MD  trifluridine-tipiracil (LONSURF) 20-8.19 MG tablet Take 4 tablets (80 mg of trifluridine total) by mouth 2 (two) times daily after a meal. On days 1-5, 8-12. Repeat every 28days 11/27/16   Ladell Pier, MD    Vital Signs: BP 128/76 (BP Location: Left Arm)   Pulse 73   Temp 99.2 F (37.3 C) (Oral)   Resp 16   Ht _0  (1.803 m)   Wt 223 lb (101.2 kg)   SpO2 98%   BMI 31.10 kg/m   Physical  Exam: Abd: soft, appropriately tender around the drain, pain mostly with moving, drain site is c/d/i.  Bag with bilious output, 1L in 24hrs  Imaging: Dg Chest Port 1 View  Result Date: 01/06/2017 CLINICAL DATA:  Right upper quadrant pain since ERCP performed yesterday. EXAM: PORTABLE CHEST 1 VIEW COMPARISON:  ERCP and biliary stent retrieval -01/04/2017; fluoroscopic guided percutaneous biliary drainage catheter placement -01/04/2017 ; abdominal radiograph - earlier same day; acute abdominal radiographic series - 12/08/2016 FINDINGS: Grossly unchanged cardiac silhouette and mediastinal contours given slightly reduced lung volumes.  Stable position of support apparatus. Left basilar heterogeneous opacities are unchanged in favored to represent atelectasis or scar. No new focal airspace opacities. No pleural effusion or pneumothorax. No evidence of edema. No acute osseus abnormalities. Surgical clips overlying the midline of the upper abdomen. Percutaneous biliary drainage catheter overlies right upper abdominal quadrant as does internal biliary stent. IMPRESSION: Decreased lung volumes without superimposed acute cardiopulmonary disease. Electronically Signed   By: Sandi Mariscal M.D.   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 He has an internal metal stent which has become occluded. A plastic biliary stent was recently placed and will now be removed prior to percutaneous transhepatic cholangiogram and internal/external biliary drain placement. EXAM: ERCP TECHNIQUE: Multiple spot images obtained with the fluoroscopic device and submitted for interpretation post-procedure. FLUOROSCOPY TIME:  Please see operative note for detail. COMPARISON:  MRCP 12/24/2016 FINDINGS: Three intraoperative saved images demonstrate a flexible endoscope in the descending duodenum. A plastic biliary stent is noted passing coaxially through a metallic stent. On the final image, the plastic stent is no longer present. IMPRESSION: Successful retrieval of plastic biliary stent. These images were submitted for radiologic interpretation only. Please see the procedural report for the amount of contrast and the fluoroscopy time utilized. Electronically Signed   By: Jacqulynn Cadet M.D.   On: 01/04/2017 11:31   Dg Abd Portable 1v  Result Date: 01/06/2017 CLINICAL DATA:  Post internal biliary stent retrieval and placement of percutaneous biliary drainage catheter. EXAM: PORTABLE ABDOMEN - 1 VIEW COMPARISON:  Fluoroscopic guided biliary  drainage catheter placement - 01/04/2017 ; ERCP - 01/04/2017 FINDINGS: Right hepatic approach percutaneous biliary drainage catheter tip overlies expected location of the descending portion of the duodenum with the mid/distal aspect overlying the internal biliary stent. Appearance is unchanged compared to completion images performed yesterday. Paucity of bowel gas without evidence of enteric obstruction. Nondiagnostic evaluation for pneumoperitoneum secondary supine positioning and exclusion of the lower thorax. No pneumatosis or portal venous gas. No definitive abnormal intra-abdominal calcifications No acute osseus abnormalities. IMPRESSION: 1. Grossly unchanged positioning of trans hepatic percutaneous biliary drainage catheter overlying internal biliary stent. 2. Nonobstructive bowel gas pattern. Electronically Signed   By: Sandi Mariscal M.D.   On: 01/06/2017 14:15   Ir Int Lianne Cure Biliary Drain With Cholangiogram  Result Date: 01/05/2017 INDICATION: 56 year old male with a history of metastatic colon cancer, status post left hepatectomy, with recurrent disease at the liver hilum contributing to biliary obstruction. Prior endoscopy with metal stent placement (uncovered) of the common hepatic duct Oct 15, 2016, with subsequent ERCP and plastic stent placement 12/16/2016. He has had persistent hyperbilirubinemia with concern for ongoing obstruction. He presents for percutaneous transhepatic cholangiogram and drain placement EXAM: PERCUTANEOUS TRANSHEPATIC CHOLANGIOGRAM WITH INTERNAL/ EXTERNAL DRAIN PLACEMENT MEDICATIONS: 25 mg IV Demerol, 4 mg IV Zofran ANESTHESIA/SEDATION: Moderate (conscious) sedation  was employed during this procedure. A total of Versed 4.0 mg and Fentanyl 200 mcg was administered intravenously. Moderate Sedation Time: 50 minutes. The patient's level of consciousness and vital signs were monitored continuously by radiology nursing throughout the procedure under my direct supervision. FLUOROSCOPY  TIME:  Fluoroscopy Time: 9 minutes 48 seconds (754 mGy). COMPLICATIONS: None PROCEDURE: The procedure, risks, benefits, and alternatives were explained to the patient and the patient's family. A complete informed consent was performed, with risk benefit analysis. Specific risks that were discussed for the procedure include bleeding, infection, biliary sepsis, IC use day, organ injury, need for further procedure, need for further surgery, long-term drain placement, cardiopulmonary collapse, death. Questions regarding the procedure were encouraged and answered. The patient understands and consents to the procedure. Patient is position in supine position on the fluoroscopy table, and the upper abdomen was prepped and draped in the usual sterile fashion. Maximum barrier sterile technique with sterile gowns and gloves were used for the procedure. A timeout was performed prior to the initiation of the procedure. Local anesthesia was provided with 1% lidocaine with epinephrine. Scout images of the upper abdomen were performed. The image intensifier was angulated, right anterior oblique approximately 10-20 degrees. 1% lidocaine was used for local anesthesia, with generous infiltration of the skin and subcutaneous tissues in and inter left costal location. A Chiba needle was used to access the biliary system, targeting the cranial aspect of the indwelling metallic stent of the common hepatic duct. Once the tip of the needle was confirmed within the biliary system by injecting small aliquots of contrast, images were stored of the biliary system after partially opacifying the biliary tree via the needle. A second access was required for access into the biliary system, from right intercostal location. Once the tip of this needle was confirmed within the biliary system, an 018 wire was advanced centrally. The needle was removed, a small incision was made with an 11 blade scalpel, and then the inner catheter from a triaxial  Accustick system was advanced into the biliary system. This access was determined to be with thin a sectoral duct of the right posterior liver lobe, which enter the common hepatic duct below the cranial stent margin. A third access was required for access into the biliary system, from separate right intercostal location. Once the tip of this needle was confirmed within the biliary system, an 018 wire was advanced centrally. The needle was removed, a small incision was made with an 11 blade scalpel, and then Accustick system was advanced into the biliary system. The metal stiffener and dilator were removed, we confirmed placement with contrast infusion. A coaxial Glidewire and 4 French glide cath were then used to navigate across the obstruction at the stent. Once the catheter were within the duodenum, the wire was removed and contrast confirmed location. A Benson wire was advanced through the system, and the Accustick and Glidewire were removed. Dilation of the subcutaneous tissue tracks was performed with an 8 [place] dilator, and then a 10 [place] biliary drain was placed as an internal/external biliary drain. Small amount of contrast confirmed location. The patient tolerated the procedure well and remained hemodynamically stable throughout. No complications were encountered and no significant blood loss was encountered. FINDINGS: Percutaneous transhepatic cholangiogram demonstrates complete occlusion of the uncovered metal stent of the common hepatic duct. More proximally there is dilation of the sectoral ducts of anterior and posterior right liver and remnant segment 4 ducts. IMPRESSION: Status post percutaneous transhepatic cholangiogram and placement  of internal/external biliary drain via a right-sided intercostal approach. Signed, Dulcy Fanny. Earleen Newport, DO [facility] Radiology Electronically Signed   By: Corrie Mckusick D.O.   On: 01/05/2017 10:51     Labs:  CBC:  Recent Labs  01/04/17 0930 01/05/17 0641 01/06/17 0520 01/07/17 0624  WBC 3.7* 6.4 8.9 5.1  HGB 11.5* 11.3* 12.0* 11.3*  HCT 34.2* 33.9* 36.2* 33.8*  PLT 145* 130* 131* 145*    COAGS:  Recent Labs  01/09/16 0800 01/04/17 0930  INR 0.92 1.00  APTT 27  --     BMP:  Recent Labs  01/04/17 0930 01/05/17 0641 01/06/17 0520 01/07/17 0624  NA 140 137 137 134*  K 4.0 3.8 3.7 4.1  CL 106 104 103 100*  CO2 _0 GLUCOSE 108* 109* 101* 103*  BUN _1 CALCIUM 8.9 8.7* 9.0 8.9  CREATININE 1.15 1.15 1.08 1.04  GFRNONAA >60 >60 >60 >60  GFRAA >60 >60 >60 >60    LIVER FUNCTION TESTS:  Recent Labs  01/04/17 0930 01/05/17 0641 01/06/17 0520 01/07/17 0624  BILITOT 2.3* 4.5* 3.7* 2.7*  AST 151* 244* 148* 69*  ALT 121* 196* 163* 113*  ALKPHOS 307* 343* 286* 225*  PROT 7.5 7.0 7.6 7.6  ALBUMIN 3.4* 3.0* 3.2* 3.1*    Assessment and Plan: 1. Biliary obstruction secondary to liver mets from colon cancer, s/p PTC with I/E component.  TB is continuing to fall.  His drain is working well with 1L of output since yesterday.   CX reveals MODERATE STAPHYLOCOCCUS HAEMOLYTICUS.  Sensitivities pending.  Had temp of 100.1 overnight.   Plan for I/E drain exchange sometime next week per Dr. Earleen Newport. Will follow.  Electronically Signed: Henreitta Cea 01/07/2017, 11:04 AM   I spent a total of 15 Minutes at the the patient's bedside AND on the patient's hospital floor or unit, greater than 50% of which was counseling/coordinating care for biliary obstruction

## 2017-01-07 NOTE — Progress Notes (Signed)
Progress Note   Subjective  Chief Complaint: Chief Complaint: liver cancer, abnormal bile duct-s/p ERCP with removal of plastic stent (metal stent still in place) and IR placement of Int/Ext biliary drain 01/04/17  Pt reports decrease in pain today, he continues to feel generally unwell and wishes that he would feel better. Yesterday he did not feel like eating or walking, this morning he has already been up to walk around half of the floor. He does tell me that he has better control of his reflux, he thinks this is related to drinking Coke in the morning, so he has stopped doing this. No new complaints over night.    Objective   Vital signs in last 24 hours: Temp:  [98.4 F (36.9 C)-100.1 F (37.8 C)] 99.2 F (37.3 C) (08/02 0442) Pulse Rate:  [73-88] 73 (08/02 0442) Resp:  [16-18] 16 (08/02 0442) BP: (103-128)/(60-76) 128/76 (08/02 0442) SpO2:  [96 %-98 %] 98 % (08/02 0442) Weight:  [223 lb (101.2 kg)] 223 lb (101.2 kg) (08/02 0452) Last BM Date: 01/06/17 General:    Caucasian male in NAD Heart:  Regular rate and rhythm; no murmurs Lungs: Respirations even and unlabored, lungs CTA bilaterally Abdomen:  Soft, moderate RUQ ttp, biliary drain in place with clean bandages and drainage of bilious material, mild distension Normal bowel sounds. Extremities:  Without edema. Neurologic:  Alert and oriented,  grossly normal neurologically. Psych:  Cooperative. Normal mood and affect.  Intake/Output from previous day: 08/01 0701 - 08/02 0700 In: 380 [P.O.:360] Out: 1000 [Drains:1000] Intake/Output this shift: Total I/O In: 400 [P.O.:400] Out: -   Lab Results:  Recent Labs  01/05/17 0641 01/06/17 0520 01/07/17 0624  WBC 6.4 8.9 5.1  HGB 11.3* 12.0* 11.3*  HCT 33.9* 36.2* 33.8*  PLT 130* 131* 145*   BMET  Recent Labs  01/05/17 0641 01/06/17 0520 01/07/17 0624  NA 137 137 134*  K 3.8 3.7 4.1  CL 104 103 100*  CO2 26 22 26   GLUCOSE 109* 101* 103*  BUN 15 17 18     CREATININE 1.15 1.08 1.04  CALCIUM 8.7* 9.0 8.9   LFT  Recent Labs  01/07/17 0624  PROT 7.6  ALBUMIN 3.1*  AST 69*  ALT 113*  ALKPHOS 225*  BILITOT 2.7*   Studies/Results: Dg Chest Port 1 View  Result Date: 01/06/2017 CLINICAL DATA:  Right upper quadrant pain since ERCP performed yesterday. EXAM: PORTABLE CHEST 1 VIEW COMPARISON:  ERCP and biliary stent retrieval -01/04/2017; fluoroscopic guided percutaneous biliary drainage catheter placement -01/04/2017 ; abdominal radiograph - earlier same day; acute abdominal radiographic series - 12/08/2016 FINDINGS: Grossly unchanged cardiac silhouette and mediastinal contours given slightly reduced lung volumes. Stable position of support apparatus. Left basilar heterogeneous opacities are unchanged in favored to represent atelectasis or scar. No new focal airspace opacities. No pleural effusion or pneumothorax. No evidence of edema. No acute osseus abnormalities. Surgical clips overlying the midline of the upper abdomen. Percutaneous biliary drainage catheter overlies right upper abdominal quadrant as does internal biliary stent. IMPRESSION: Decreased lung volumes without superimposed acute cardiopulmonary disease. Electronically Signed   By: Sandi Mariscal M.D.   On: 01/06/2017 14:12   Dg Abd Portable 1v  Result Date: 01/06/2017 CLINICAL DATA:  Post internal biliary stent retrieval and placement of percutaneous biliary drainage catheter. EXAM: PORTABLE ABDOMEN - 1 VIEW COMPARISON:  Fluoroscopic guided biliary drainage catheter placement - 01/04/2017 ; ERCP - 01/04/2017 FINDINGS: Right hepatic approach percutaneous biliary drainage catheter tip overlies expected  location of the descending portion of the duodenum with the mid/distal aspect overlying the internal biliary stent. Appearance is unchanged compared to completion images performed yesterday. Paucity of bowel gas without evidence of enteric obstruction. Nondiagnostic evaluation for pneumoperitoneum  secondary supine positioning and exclusion of the lower thorax. No pneumatosis or portal venous gas. No definitive abnormal intra-abdominal calcifications No acute osseus abnormalities. IMPRESSION: 1. Grossly unchanged positioning of trans hepatic percutaneous biliary drainage catheter overlying internal biliary stent. 2. Nonobstructive bowel gas pattern. Electronically Signed   By: Sandi Mariscal M.D.   On: 01/06/2017 14:15    Assessment / Plan:   Assessment: 1. Biliary obstruction: ERCP procedure 01/04/17 followed by placement of internal/external biliary drain, patient with continued right upper quadrant pain though less than yesterday, bili continues to trend down 2. Colon cancer with metastasis 3. Pancytopenia 4. GERD: Better controlled on twice a day PPI, could consider d/c H2-blocker  Plan: 1. Continue regular diet 2. Agree with trying to manage patient's pain medications in order to control this 3. Biliary fluid is growing staph organism, sensitivities are pending, hospitalist is following 4. Continue Pantoprazole 40 mg twice a day 5. Please await any further recommendations from Dr. Silverio Decamp, we will likely sign off/follow peripherally from now on, please call to let us know if you have any further questions  Thank you for your kind consultation   LOS: 2 days   Levin Erp  01/07/2017, 10:22 AM  Pager # [phone]   Attending physician's note   I have taken an interval history, reviewed the chart and examined the patient. I agree with the Advanced Practitioner's note, impression and recommendations.   Damaris Hippo, MD [phone] Mon-Fri 8a-5p [phone] after 5p, weekends, holidays

## 2017-01-07 NOTE — Progress Notes (Signed)
[facility] PROGRESS NOTE    Progress Note  JOSIMAR CORNING  ZTI:[id number] DOB: 07/16/1960 DOA: 01/04/2017 PCP: Ria Bush, MD     Brief Narrative:   DAMANTE SPRAGG is an 56 y.o. male past medical history of colon cancer static to the liver status post hepatectomy with biliary obstruction status post stent placement admitted on 01/05/1999 is 16 presents with complaint of abdominal pain was found bradycardic and with fever started empirically on antibiotics.  Assessment/Plan:   Disorder of bile duct stent Status post ERCP with no significant improvement, ERCP repeated in 01/05/1999 813 with stent removal. IR was consulted who performed a percutaneous biliary drain, w and now bilirubin is slowly trending down. Chest x-ray and abdominal x-ray were unremarkable. He was started empirically on IV antibiotics. He is currently on Robaxin and Dilaudid for pain. Cultures of biliary fluid is growing staph hemolyticus, awaiting sensitivities. Pain not controlled start him on long acting Oxy decrease Dilaudid.  Sepsis: Had a fever, tachycardia, with leukocytosis, blood cultures negative. New biliary cultures are growing staph hemolyticus. Will continue on empiric Zosyn for now. Awaiting sensitivities.  Primary colon cancer with metastasis to other site [facility]) Follow-up with Dr. shows an outpatient continue to hold chemotherapy.  Essential hypertension: Continue Nadalol.   DVT prophylaxis: lovenox Family Communication:wife Disposition Plan/Barrier to D/C: hopefully in 1-2 days Code Status:     Code Status Orders        Start     Ordered   01/04/17 1639  Full code  Continuous     01/04/17 1641    Code Status History    Date Active Date Inactive Code Status Order ID Comments User Context   12/08/2016  8:19 PM 12/18/2016  8:47 PM Full Code [id number]  Vianne Bulls, MD ED   10/21/2016  2:55 PM 10/26/2016  4:22 PM Full Code [id number]  Owens Shark, NP Inpatient   07/23/2016  5:03 PM 07/26/2016  4:42 PM Full Code [id number]  Stark Klein, MD Inpatient   08/06/2014 11:09 AM 08/07/2014  3:44 AM Full Code [id number]  Markus Daft, MD HOV   01/15/2014  4:41 PM 01/22/2014  5:40 PM Full Code [id number]  Stark Klein, MD Inpatient   11/07/2013 10:19 AM 11/08/2013  3:34 AM Full Code [id number]  Sandi Mariscal, MD HOV        IV Access:    Peripheral IV   Procedures and diagnostic studies:   Dg Chest Port 1 View  Result Date: 01/06/2017 CLINICAL DATA:  Right upper quadrant pain since ERCP performed yesterday. EXAM: PORTABLE CHEST 1 VIEW COMPARISON:  ERCP and biliary stent retrieval -01/04/2017; fluoroscopic guided percutaneous biliary drainage catheter placement -01/04/2017 ; abdominal radiograph - earlier same day; acute abdominal radiographic series - 12/08/2016 FINDINGS: Grossly unchanged cardiac silhouette and mediastinal contours given slightly reduced lung volumes. Stable position of support apparatus. Left basilar heterogeneous opacities are unchanged in favored to represent atelectasis or scar. No new focal airspace opacities. No pleural effusion or pneumothorax. No evidence of edema. No acute osseus abnormalities. Surgical clips overlying the midline of the upper abdomen. Percutaneous biliary drainage catheter overlies right upper abdominal quadrant as does internal biliary stent. IMPRESSION: Decreased lung volumes without superimposed acute cardiopulmonary disease. Electronically Signed   By: Sandi Mariscal M.D.   On: 01/06/2017 14:12   Dg Abd Portable 1v  Result Date: 01/06/2017 CLINICAL DATA:  Post internal biliary stent retrieval and placement of percutaneous biliary drainage catheter. EXAM: PORTABLE ABDOMEN - 1 VIEW COMPARISON:  Fluoroscopic guided biliary drainage catheter placement - 01/04/2017 ; ERCP - 01/04/2017 FINDINGS: Right hepatic approach percutaneous biliary drainage catheter tip overlies expected location of the descending portion of the duodenum with the  mid/distal aspect overlying the internal biliary stent. Appearance is unchanged compared to completion images performed yesterday. Paucity of bowel gas without evidence of enteric obstruction. Nondiagnostic evaluation for pneumoperitoneum secondary supine positioning and exclusion of the lower thorax. No pneumatosis or portal venous gas. No definitive abnormal intra-abdominal calcifications No acute osseus abnormalities. IMPRESSION: 1. Grossly unchanged positioning of trans hepatic percutaneous biliary drainage catheter overlying internal biliary stent. 2. Nonobstructive bowel gas pattern. Electronically Signed   By: Sandi Mariscal M.D.   On: 01/06/2017 14:15     Medical Consultants:    None.  Anti-Infectives:   IV Zosyn.  Subjective:    Tamala Fothergill he relates his pain is improved compared to yesterday.  Objective:    Vitals:   01/06/17 2027 01/07/17 0001 01/07/17 0442 01/07/17 0452  BP: 113/67  128/76   Pulse: 87  73   Resp: 18  16   Temp: 100.1 F (37.8 C)  99.2 F (37.3 C)   TempSrc: Oral  Oral   SpO2: 98%  98%   Weight:  101.2 kg (223 lb)  101.2 kg (223 lb)  Height:        Intake/Output Summary (Last 24 hours) at 01/07/17 0945 Last data filed at 01/07/17 0944  Gross per 24 hour  Intake              540 ml  Output              800 ml  Net             -260 ml   Filed Weights   01/06/17 0640 01/07/17 0001 01/07/17 0452  Weight: 101.5 kg (223 lb 12.3 oz) 101.2 kg (223 lb) 101.2 kg (223 lb)    Exam: General exam: In no acute distress. Respiratory system: Good air movement encouraged to auscultation. Cardiovascular system: Regular rate and rhythm with positive S1-S2. Gastrointestinal system: Abdomen is soft mildly distended nontender, the percutaneous biliary drain in place. Not erythematous. Central nervous system: Awake alert and oriented 3. Extremities: Lower extremity edema. Skin: No rashes or ulceration. Psychiatry: Judgement and insight appear normal.  Mood & affect appropriate.    Data Reviewed:    Labs: Basic Metabolic Panel:  Recent Labs Lab 01/04/17 0930 01/05/17 0641 01/06/17 0520 01/07/17 0624  NA 140 137 137 134*  K 4.0 3.8 3.7 4.1  CL 106 104 103 100*  CO2 25 26 22 26   GLUCOSE 108* 109* 101* 103*  BUN 17 15 17 18   CREATININE 1.15 1.15 1.08 1.04  CALCIUM 8.9 8.7* 9.0 8.9  MG  --   --   --  1.8   GFR Estimated Creatinine Clearance: 93.8 mL/min (by C-G formula based on SCr of 1.04 mg/dL). Liver Function Tests:  Recent Labs Lab 01/04/17 0930 01/05/17 0641 01/06/17 0520 01/07/17 0624  AST 151* 244* 148* 69*  ALT 121* 196* 163* 113*  ALKPHOS 307* 343* 286* 225*  BILITOT 2.3* 4.5* 3.7* 2.7*  PROT 7.5 7.0 7.6 7.6  ALBUMIN 3.4* 3.0* 3.2* 3.1*    Recent Labs Lab 01/07/17 0624  AMYLASE 16*   No results for input(s): AMMONIA in the last 168 hours. Coagulation profile  Recent Labs Lab 01/04/17 0930  INR 1.00    CBC:  Recent Labs Lab 01/04/17 0930 01/05/17 0641  01/06/17 0520 01/07/17 0624  WBC 3.7* 6.4 8.9 5.1  NEUTROABS 2.5  --   --  2.9  HGB 11.5* 11.3* 12.0* 11.3*  HCT 34.2* 33.9* 36.2* 33.8*  MCV 97.2 98.8 97.8 98.5  PLT 145* 130* 131* 145*   Cardiac Enzymes: No results for input(s): CKTOTAL, CKMB, CKMBINDEX, TROPONINI in the last 168 hours. BNP (last 3 results) No results for input(s): PROBNP in the last 8760 hours. CBG:  Recent Labs Lab 01/05/17 0714 01/06/17 0804 01/07/17 0712  GLUCAP 95 104* 89   D-Dimer: No results for input(s): DDIMER in the last 72 hours. Hgb A1c: No results for input(s): HGBA1C in the last 72 hours. Lipid Profile: No results for input(s): CHOL, HDL, LDLCALC, TRIG, CHOLHDL, LDLDIRECT in the last 72 hours. Thyroid function studies: No results for input(s): TSH, T4TOTAL, T3FREE, THYROIDAB in the last 72 hours.  Invalid input(s): FREET3 Anemia work up: No results for input(s): VITAMINB12, FOLATE, FERRITIN, TIBC, IRON, RETICCTPCT in the last 72  hours. Sepsis Labs:  Recent Labs Lab 01/04/17 0930 01/05/17 0641 01/06/17 0520 01/07/17 0624  WBC 3.7* 6.4 8.9 5.1   Microbiology Recent Results (from the past 240 hour(s))  Culture, blood (routine x 2)     Status: None (Preliminary result)   Collection Time: 01/04/17  5:53 PM  Result Value Ref Range Status   Specimen Description BLOOD LEFT HAND  Final   Special Requests   Final    BOTTLES DRAWN AEROBIC ONLY Blood Culture adequate volume   Culture   Final    NO GROWTH 2 DAYS Performed at [facility], [address] [place], [place] [address]    Report Status PENDING  Incomplete  Culture, blood (routine x 2)     Status: None (Preliminary result)   Collection Time: 01/04/17  6:01 PM  Result Value Ref Range Status   Specimen Description BLOOD BLOOD LEFT HAND  Final   Special Requests IN PEDIATRIC BOTTLE Blood Culture adequate volume  Final   Culture   Final    NO GROWTH 2 DAYS Performed at [facility], [place] [address] [place], [place] [address]    Report Status PENDING  Incomplete  Body fluid culture     Status: None (Preliminary result)   Collection Time: 01/05/17  4:33 PM  Result Value Ref Range Status   Specimen Description BILE  Final   Special Requests Normal  Final   Gram Stain   Final    NO WBC SEEN FEW GRAM POSITIVE COCCI IN CLUSTERS Performed at [facility], [place] [address] [place], [place] [address]    Culture MODERATE STAPHYLOCOCCUS HAEMOLYTICUS  Final   Report Status PENDING  Incomplete     Medications:   . enoxaparin (LOVENOX) injection  40 mg Subcutaneous Q24H  . famotidine  20 mg Oral BID  . methocarbamol  500 mg Oral QID  . metoCLOPramide  10 mg Oral TID AC  . nadolol  80 mg Oral Daily  . pantoprazole  40 mg Oral Q12H  . polyethylene glycol  17 g Oral Daily  . potassium chloride SA  20 mEq Oral Daily  . sodium chloride flush  5 mL Intravenous Q8H  . vitamin B-12  2,500 mcg Oral Daily   Continuous Infusions: .  piperacillin-tazobactam (ZOSYN)  IV 3.375 g (01/07/17 0231)      LOS: 2 days   Charlynne Cousins  [facility] Pager [phone]  *Please refer to [email or web address], password [email or web address] to get  updated schedule on who will round on this patient, as hospitalists switch teams weekly. If 7PM-7AM, please contact night-coverage at [email or web address], password [email or web address] for any overnight needs.  01/07/2017, 9:45 AM

## 2017-01-08 ENCOUNTER — Other Ambulatory Visit: Payer: 59

## 2017-01-08 ENCOUNTER — Other Ambulatory Visit: Payer: Self-pay | Admitting: Radiology

## 2017-01-08 ENCOUNTER — Ambulatory Visit: Payer: 59 | Admitting: Oncology

## 2017-01-08 DIAGNOSIS — C189 Malignant neoplasm of colon, unspecified: Secondary | ICD-10-CM

## 2017-01-08 DIAGNOSIS — C787 Secondary malignant neoplasm of liver and intrahepatic bile duct: Principal | ICD-10-CM

## 2017-01-08 LAB — BODY FLUID CULTURE
Gram Stain: NONE SEEN
Special Requests: NORMAL

## 2017-01-08 LAB — GLUCOSE, CAPILLARY: Glucose-Capillary: 119 mg/dL — ABNORMAL HIGH (ref 65–99)

## 2017-01-08 MED ORDER — OXYCODONE HCL ER 15 MG PO T12A
15.0000 mg | EXTENDED_RELEASE_TABLET | Freq: Two times a day (BID) | ORAL | Status: DC
  Administered 2017-01-08 – 2017-01-09 (×4): 15 mg via ORAL
  Filled 2017-01-08 (×4): qty 1

## 2017-01-08 MED ORDER — HYDROMORPHONE HCL 2 MG PO TABS: 2.0000 mg | ORAL_TABLET | ORAL | Status: DC | PRN

## 2017-01-08 MED ORDER — HYDROMORPHONE HCL 2 MG PO TABS
2.0000 mg | ORAL_TABLET | ORAL | Status: DC | PRN
  Administered 2017-01-08 – 2017-01-10 (×13): 2 mg via ORAL
  Filled 2017-01-08 (×13): qty 1

## 2017-01-08 NOTE — Care Management Note (Signed)
Case Management Note  Patient Details  Name: FINBAR NIPPERT MRN: [id number] Date of Birth: July 08, 1960  Subjective/Objective:         56 yo admitted with Disorder of bile duct stent. Hx of colon cancer with mets           Action/Plan: From home. Unsure at this time if pt will DC with perc drain. If so, will need teaching from RN on care at home.  Expected Discharge Date:   (unknown)               Expected Discharge Plan:  Home/Self Care  In-House Referral:     Discharge planning Services  CM Consult  Post Acute Care Choice:    Choice offered to:     DME Arranged:    DME Agency:     HH Arranged:    HH Agency:     Status of Service:  In process, will continue to follow  If discussed at Long Length of Stay Meetings, dates discussed:    Additional CommentsLynnell Catalan, RN 01/08/2017, 2:28 PM  [phone]

## 2017-01-08 NOTE — Progress Notes (Signed)
Referring Physician(s): Rande Brunt  Supervising Physician: Aletta Edouard  Patient Status:  [facility] - In-pt  Chief Complaint: Metastatic colon cancer with biliary obstruction   Subjective: Pt doing fair; still has some RUQ discomfort but sl improved since yesterday; nausea also improved; having few loose stools but received miralax earlier   Allergies: Other  Medications: Prior to Admission medications   Medication Sig Start Date End Date Taking? Authorizing Provider  acetaminophen (TYLENOL) 325 MG tablet Take 650 mg by mouth every 6 (six) hours as needed for moderate pain or fever.   Yes [provider]  atenolol (TENORMIN) 50 MG tablet Take 1 tablet (50 mg total) by mouth daily. 04/17/16  Yes Ria Bush, MD  Cyanocobalamin (VITAMIN B-12) 2500 MCG SUBL Place 1 tablet under the tongue daily.   Yes [provider]  diazepam (VALIUM) 5 MG tablet Take 5 mg by mouth 3 (three) times daily as needed for muscle spasms.  11/29/16  Yes [provider]  HYDROmorphone (DILAUDID) 2 MG tablet Take 1-2 tablets (2-4 mg total) by mouth every 4 (four) hours as needed for severe pain. Patient taking differently: Take 2 mg by mouth every 2 (two) hours as needed for severe pain.  12/07/16  Yes Ladell Pier, MD  ibuprofen (ADVIL,MOTRIN) 200 MG tablet Take 600 mg by mouth every 6 (six) hours as needed for mild pain.    Yes [provider]  KLOR-CON M20 20 MEQ tablet Take 20 mEq by mouth daily. 11/26/16  Yes [provider]  lidocaine-prilocaine (EMLA) cream Apply 1 application topically as needed (apply to port).   Yes [provider]  metoCLOPramide (REGLAN) 10 MG tablet Take 1 tablet (10 mg total) by mouth 3 (three) times daily before meals. 12/25/16 01/24/17 Yes Owens Shark, NP  omeprazole (PRILOSEC) 40 MG capsule Take 1 capsule (40 mg total) by mouth every other day. Patient taking differently: Take 40 mg by mouth daily.  04/17/16  Yes  Ria Bush, MD  ondansetron (ZOFRAN) 8 MG tablet Take 1 tablet (8 mg total) by mouth every 8 (eight) hours as needed for nausea or vomiting. 11/27/16  Yes Ladell Pier, MD  polyethylene glycol (MIRALAX / GLYCOLAX) packet Take 17 g by mouth daily.   Yes [provider]  ranitidine (ZANTAC) 150 MG tablet Take 150 mg by mouth daily.   Yes [provider]  sildenafil (VIAGRA) 100 MG tablet Take 100 mg by mouth daily as needed for erectile dysfunction. Reported on 06/17/2015   Yes [provider]  ciprofloxacin (CIPRO) 500 MG tablet Take 1 tablet (500 mg total) by mouth 2 (two) times daily. Patient not taking: Reported on 12/30/2016 12/21/16   Milus Banister, MD  prochlorperazine (COMPAZINE) 10 MG tablet Take 1 tablet (10 mg total) by mouth every 6 (six) hours as needed for nausea or vomiting. Patient not taking: Reported on 12/25/2016 11/27/16   Ladell Pier, MD  trifluridine-tipiracil (LONSURF) 20-8.19 MG tablet Take 4 tablets (80 mg of trifluridine total) by mouth 2 (two) times daily after a meal. On days 1-5, 8-12. Repeat every 28days 11/27/16   Ladell Pier, MD     Vital Signs: BP 112/76 (BP Location: Right Arm)   Pulse 72   Temp 98.5 F (36.9 C) (Oral)   Resp 18   Ht _0  (1.803 m)   Wt 223 lb 14.4 oz (101.6 kg)   SpO2 98%   BMI 31.23 kg/m   Physical Exam biliary  drain intact, dressing dry, mildly tender, output 150+cc  dark green bile  Imaging: Dg Chest Port 1 View  Result Date: 01/06/2017 CLINICAL DATA:  Right upper quadrant pain since ERCP performed yesterday. EXAM: PORTABLE CHEST 1 VIEW COMPARISON:  ERCP and biliary stent retrieval -01/04/2017; fluoroscopic guided percutaneous biliary drainage catheter placement -01/04/2017 ; abdominal radiograph - earlier same day; acute abdominal radiographic series - 12/08/2016 FINDINGS: Grossly unchanged cardiac silhouette and mediastinal contours given slightly reduced lung volumes. Stable position of  support apparatus. Left basilar heterogeneous opacities are unchanged in favored to represent atelectasis or scar. No new focal airspace opacities. No pleural effusion or pneumothorax. No evidence of edema. No acute osseus abnormalities. Surgical clips overlying the midline of the upper abdomen. Percutaneous biliary drainage catheter overlies right upper abdominal quadrant as does internal biliary stent. IMPRESSION: Decreased lung volumes without superimposed acute cardiopulmonary disease. Electronically Signed   By: Sandi Mariscal M.D.   On: 01/06/2017 14:12   Dg Abd Portable 1v  Result Date: 01/06/2017 CLINICAL DATA:  Post internal biliary stent retrieval and placement of percutaneous biliary drainage catheter. EXAM: PORTABLE ABDOMEN - 1 VIEW COMPARISON:  Fluoroscopic guided biliary drainage catheter placement - 01/04/2017 ; ERCP - 01/04/2017 FINDINGS: Right hepatic approach percutaneous biliary drainage catheter tip overlies expected location of the descending portion of the duodenum with the mid/distal aspect overlying the internal biliary stent. Appearance is unchanged compared to completion images performed yesterday. Paucity of bowel gas without evidence of enteric obstruction. Nondiagnostic evaluation for pneumoperitoneum secondary supine positioning and exclusion of the lower thorax. No pneumatosis or portal venous gas. No definitive abnormal intra-abdominal calcifications No acute osseus abnormalities. IMPRESSION: 1. Grossly unchanged positioning of trans hepatic percutaneous biliary drainage catheter overlying internal biliary stent. 2. Nonobstructive bowel gas pattern. Electronically Signed   By: Sandi Mariscal M.D.   On: 01/06/2017 14:15   Ir Int Lianne Cure Biliary Drain With Cholangiogram  Result Date: 01/05/2017 INDICATION: 56 year old male with a history of metastatic colon cancer, status post left hepatectomy, with recurrent disease at the liver hilum contributing to biliary obstruction. Prior endoscopy  with metal stent placement (uncovered) of the common hepatic duct Oct 15, 2016, with subsequent ERCP and plastic stent placement 12/16/2016. He has had persistent hyperbilirubinemia with concern for ongoing obstruction. He presents for percutaneous transhepatic cholangiogram and drain placement EXAM: PERCUTANEOUS TRANSHEPATIC CHOLANGIOGRAM WITH INTERNAL/ EXTERNAL DRAIN PLACEMENT MEDICATIONS: 25 mg IV Demerol, 4 mg IV Zofran ANESTHESIA/SEDATION: Moderate (conscious) sedation was employed during this procedure. A total of Versed 4.0 mg and Fentanyl 200 mcg was administered intravenously. Moderate Sedation Time: 50 minutes. The patient's level of consciousness and vital signs were monitored continuously by radiology nursing throughout the procedure under my direct supervision. FLUOROSCOPY TIME:  Fluoroscopy Time: 9 minutes 48 seconds (754 mGy). COMPLICATIONS: None PROCEDURE: The procedure, risks, benefits, and alternatives were explained to the patient and the patient's family. A complete informed consent was performed, with risk benefit analysis. Specific risks that were discussed for the procedure include bleeding, infection, biliary sepsis, IC use day, organ injury, need for further procedure, need for further surgery, long-term drain placement, cardiopulmonary collapse, death. Questions regarding the procedure were encouraged and answered. The patient understands and consents to the procedure. Patient is position in supine position on the fluoroscopy table, and the upper abdomen was prepped and draped in the usual sterile fashion. Maximum barrier sterile technique with sterile gowns and gloves were used for the procedure. A timeout was performed prior to the initiation of the procedure.  Local anesthesia was provided with 1% lidocaine with epinephrine. Scout images of the upper abdomen were performed. The image intensifier was angulated, right anterior oblique approximately 10-20 degrees. 1% lidocaine was used for  local anesthesia, with generous infiltration of the skin and subcutaneous tissues in and inter left costal location. A Chiba needle was used to access the biliary system, targeting the cranial aspect of the indwelling metallic stent of the common hepatic duct. Once the tip of the needle was confirmed within the biliary system by injecting small aliquots of contrast, images were stored of the biliary system after partially opacifying the biliary tree via the needle. A second access was required for access into the biliary system, from right intercostal location. Once the tip of this needle was confirmed within the biliary system, an 018 wire was advanced centrally. The needle was removed, a small incision was made with an 11 blade scalpel, and then the inner catheter from a triaxial Accustick system was advanced into the biliary system. This access was determined to be with thin a sectoral duct of the right posterior liver lobe, which enter the common hepatic duct below the cranial stent margin. A third access was required for access into the biliary system, from separate right intercostal location. Once the tip of this needle was confirmed within the biliary system, an 018 wire was advanced centrally. The needle was removed, a small incision was made with an 11 blade scalpel, and then Accustick system was advanced into the biliary system. The metal stiffener and dilator were removed, we confirmed placement with contrast infusion. A coaxial Glidewire and 4 French glide cath were then used to navigate across the obstruction at the stent. Once the catheter were within the duodenum, the wire was removed and contrast confirmed location. A Benson wire was advanced through the system, and the Accustick and Glidewire were removed. Dilation of the subcutaneous tissue tracks was performed with an 8 [place] dilator, and then a 10 [place] biliary drain was placed as an internal/external biliary drain. Small amount of contrast  confirmed location. The patient tolerated the procedure well and remained hemodynamically stable throughout. No complications were encountered and no significant blood loss was encountered. FINDINGS: Percutaneous transhepatic cholangiogram demonstrates complete occlusion of the uncovered metal stent of the common hepatic duct. More proximally there is dilation of the sectoral ducts of anterior and posterior right liver and remnant segment 4 ducts. IMPRESSION: Status post percutaneous transhepatic cholangiogram and placement of internal/external biliary drain via a right-sided intercostal approach. Signed, Dulcy Fanny. Earleen Newport, DO [facility] Radiology Electronically Signed   By: Corrie Mckusick D.O.   On: 01/05/2017 10:51    Labs:  CBC:  Recent Labs  01/04/17 0930 01/05/17 0641 01/06/17 0520 01/07/17 0624  WBC 3.7* 6.4 8.9 5.1  HGB 11.5* 11.3* 12.0* 11.3*  HCT 34.2* 33.9* 36.2* 33.8*  PLT 145* 130* 131* 145*    COAGS:  Recent Labs  01/04/17 0930  INR 1.00    BMP:  Recent Labs  01/04/17 0930 01/05/17 0641 01/06/17 0520 01/07/17 0624  NA 140 137 137 134*  K 4.0 3.8 3.7 4.1  CL 106 104 103 100*  CO2 _0 GLUCOSE 108* 109* 101* 103*  BUN _1 CALCIUM 8.9 8.7* 9.0 8.9  CREATININE 1.15 1.15 1.08 1.04  GFRNONAA >60 >60 >60 >60  GFRAA >60 >60 >60 >60    LIVER FUNCTION TESTS:  Recent Labs  01/04/17 0930 01/05/17  4439 01/06/17 0520 01/07/17 0624  BILITOT 2.3* 4.5* 3.7* 2.7*  AST 151* 244* 148* 69*  ALT 121* 196* 163* 113*  ALKPHOS 307* 343* 286* 225*  PROT 7.5 7.0 7.6 7.6  ALBUMIN 3.4* 3.0* 3.2* 3.1*    Assessment and Plan: Met colon cancer with biliary obstruction/obstructed biliary stent ; s/p I/E biliary drain 7/30; AF;  blood cx neg to date, bile cx - mod staph hemolyticus,multidrug resistant- rec consulting with ID pharmacist for appropriate antbx coverage; recheck labs in am; cont drain flushes tid  as IP and once daily as OP; OOB; will tent schedule pt for OP biliary drain exchange on 8/10.   Electronically Signed: D. Rowe Derrian, PA-C 01/08/2017, 2:33 PM   I spent a total of 20 minutes at the the patient's bedside AND on the patient's hospital floor or unit, greater than 50% of which was counseling/coordinating care for biliary drain    Patient ID: Bryan Fields, male   DOB: May 18, 1961, 56 y.o.   MRN: [id number]

## 2017-01-08 NOTE — Progress Notes (Addendum)
[facility] PROGRESS NOTE    Progress Note  Bryan Fields  SJG:[id number] DOB: Feb 13, 1961 DOA: 01/04/2017 PCP: Ria Bush, MD     Brief Narrative:   Bryan Fields is an 56 y.o. male past medical history of colon cancer static to the liver status post hepatectomy with biliary obstruction status post stent placement admitted on 01/05/1999 is 16 presents with complaint of abdominal pain was found bradycardic and with fever started empirically on antibiotics.  Assessment/Plan:   Disorder of bile duct stent Status post ERCP with no significant improvement, ERCP repeated in 01/05/1999 813 with stent removal. IR was consulted who performed a percutaneous biliary drain,  and now bilirubin is slowly trending down. Chest x-ray and abdominal x-ray were unremarkable. He was started empirically on IV antibiotics. He relates his pain is not controlled, it is improving with the changes made yesterday. Cont long-acting oxycodone plus Dilaudid. Cultures of biliary fluid is growing staph hemolyticus, awaiting sensitivities. Increase long-acting decrease Dilaudid orally. Discontinue IV Dilaudid and morphine.  Sepsis: Had a fever, tachycardia, with leukocytosis, blood cultures negative. New biliary cultures are growing staph hemolyticus, sensitivities are pending, will have to treat for 10 days. Will continue on empiric Zosyn for now.  Primary colon cancer with metastasis to other site [facility]) Follow-up with Dr. shows an outpatient continue to hold chemotherapy.  Essential hypertension: Continue Nadalol.   DVT prophylaxis: lovenox Family Communication:wife Disposition Plan/Barrier to D/C: once sensitivities are back to probably change him to oral regimen discharge home. Code Status:     Code Status Orders        Start     Ordered   01/04/17 1639  Full code  Continuous     01/04/17 1641    Code Status History    Date Active Date Inactive Code Status Order ID Comments  User Context   12/08/2016  8:19 PM 12/18/2016  8:47 PM Full Code [id number]  Vianne Bulls, MD ED   10/21/2016  2:55 PM 10/26/2016  4:22 PM Full Code [id number]  Owens Shark, NP Inpatient   07/23/2016  5:03 PM 07/26/2016  4:42 PM Full Code [id number]  Stark Klein, MD Inpatient   08/06/2014 11:09 AM 08/07/2014  3:44 AM Full Code [id number]  Markus Daft, MD HOV   01/15/2014  4:41 PM 01/22/2014  5:40 PM Full Code [id number]  Stark Klein, MD Inpatient   11/07/2013 10:19 AM 11/08/2013  3:34 AM Full Code [id number]  Sandi Mariscal, MD HOV        IV Access:    Peripheral IV   Procedures and diagnostic studies:   Dg Chest Port 1 View  Result Date: 01/06/2017 CLINICAL DATA:  Right upper quadrant pain since ERCP performed yesterday. EXAM: PORTABLE CHEST 1 VIEW COMPARISON:  ERCP and biliary stent retrieval -01/04/2017; fluoroscopic guided percutaneous biliary drainage catheter placement -01/04/2017 ; abdominal radiograph - earlier same day; acute abdominal radiographic series - 12/08/2016 FINDINGS: Grossly unchanged cardiac silhouette and mediastinal contours given slightly reduced lung volumes. Stable position of support apparatus. Left basilar heterogeneous opacities are unchanged in favored to represent atelectasis or scar. No new focal airspace opacities. No pleural effusion or pneumothorax. No evidence of edema. No acute osseus abnormalities. Surgical clips overlying the midline of the upper abdomen. Percutaneous biliary drainage catheter overlies right upper abdominal quadrant as does internal biliary stent. IMPRESSION: Decreased lung volumes without superimposed acute cardiopulmonary disease. Electronically Signed   By: Sandi Mariscal M.D.   On: 01/06/2017 14:12   Dg Abd  Portable 1v  Result Date: 01/06/2017 CLINICAL DATA:  Post internal biliary stent retrieval and placement of percutaneous biliary drainage catheter. EXAM: PORTABLE ABDOMEN - 1 VIEW COMPARISON:  Fluoroscopic guided biliary drainage catheter placement  - 01/04/2017 ; ERCP - 01/04/2017 FINDINGS: Right hepatic approach percutaneous biliary drainage catheter tip overlies expected location of the descending portion of the duodenum with the mid/distal aspect overlying the internal biliary stent. Appearance is unchanged compared to completion images performed yesterday. Paucity of bowel gas without evidence of enteric obstruction. Nondiagnostic evaluation for pneumoperitoneum secondary supine positioning and exclusion of the lower thorax. No pneumatosis or portal venous gas. No definitive abnormal intra-abdominal calcifications No acute osseus abnormalities. IMPRESSION: 1. Grossly unchanged positioning of trans hepatic percutaneous biliary drainage catheter overlying internal biliary stent. 2. Nonobstructive bowel gas pattern. Electronically Signed   By: Sandi Mariscal M.D.   On: 01/06/2017 14:15     Medical Consultants:    None.  Anti-Infectives:   IV Zosyn.  Subjective:    Bryan Fields he relates his pain is better controlled today.  Objective:    Vitals:   01/07/17 1425 01/07/17 2030 01/08/17 0438 01/08/17 0453  BP: 113/71 106/76 112/76   Pulse: 73 76 72   Resp: 17 18 18    Temp: 98.5 F (36.9 C) 99 F (37.2 C) 98.5 F (36.9 C)   TempSrc: Oral Oral Oral   SpO2: 99% 99% 98%   Weight:    101.6 kg (223 lb 14.4 oz)  Height:        Intake/Output Summary (Last 24 hours) at 01/08/17 0827 Last data filed at 01/08/17 7829  Gross per 24 hour  Intake              805 ml  Output              800 ml  Net                5 ml   Filed Weights   01/07/17 0001 01/07/17 0452 01/08/17 0453  Weight: 101.2 kg (223 lb) 101.2 kg (223 lb) 101.6 kg (223 lb 14.4 oz)    Exam: General exam: In no acute distress. Respiratory system: Good air movement encouraged to auscultation. Cardiovascular system: Regular rate and rhythm with positive S1-S2. Gastrointestinal system: Abdomen is soft mildly distended nontender, the percutaneous biliary drain  in place. Not erythematous. Central nervous system: Awake alert and oriented 3. Extremities: Lower extremity edema. Skin: No rashes or ulceration. Psychiatry: Judgement and insight appear normal. Mood & affect appropriate.    Data Reviewed:    Labs: Basic Metabolic Panel:  Recent Labs Lab 01/04/17 0930 01/05/17 0641 01/06/17 0520 01/07/17 0624  NA 140 137 137 134*  K 4.0 3.8 3.7 4.1  CL 106 104 103 100*  CO2 25 26 22 26   GLUCOSE 108* 109* 101* 103*  BUN 17 15 17 18   CREATININE 1.15 1.15 1.08 1.04  CALCIUM 8.9 8.7* 9.0 8.9  MG  --   --   --  1.8   GFR Estimated Creatinine Clearance: 94 mL/min (by C-G formula based on SCr of 1.04 mg/dL). Liver Function Tests:  Recent Labs Lab 01/04/17 0930 01/05/17 0641 01/06/17 0520 01/07/17 0624  AST 151* 244* 148* 69*  ALT 121* 196* 163* 113*  ALKPHOS 307* 343* 286* 225*  BILITOT 2.3* 4.5* 3.7* 2.7*  PROT 7.5 7.0 7.6 7.6  ALBUMIN 3.4* 3.0* 3.2* 3.1*    Recent Labs Lab 01/07/17 0624  AMYLASE 16*  No results for input(s): AMMONIA in the last 168 hours. Coagulation profile  Recent Labs Lab 01/04/17 0930  INR 1.00    CBC:  Recent Labs Lab 01/04/17 0930 01/05/17 0641 01/06/17 0520 01/07/17 0624  WBC 3.7* 6.4 8.9 5.1  NEUTROABS 2.5  --   --  2.9  HGB 11.5* 11.3* 12.0* 11.3*  HCT 34.2* 33.9* 36.2* 33.8*  MCV 97.2 98.8 97.8 98.5  PLT 145* 130* 131* 145*   Cardiac Enzymes: No results for input(s): CKTOTAL, CKMB, CKMBINDEX, TROPONINI in the last 168 hours. BNP (last 3 results) No results for input(s): PROBNP in the last 8760 hours. CBG:  Recent Labs Lab 01/05/17 0714 01/06/17 0804 01/07/17 0712 01/08/17 0810  GLUCAP 95 104* 89 119*   D-Dimer: No results for input(s): DDIMER in the last 72 hours. Hgb A1c: No results for input(s): HGBA1C in the last 72 hours. Lipid Profile: No results for input(s): CHOL, HDL, LDLCALC, TRIG, CHOLHDL, LDLDIRECT in the last 72 hours. Thyroid function studies: No  results for input(s): TSH, T4TOTAL, T3FREE, THYROIDAB in the last 72 hours.  Invalid input(s): FREET3 Anemia work up: No results for input(s): VITAMINB12, FOLATE, FERRITIN, TIBC, IRON, RETICCTPCT in the last 72 hours. Sepsis Labs:  Recent Labs Lab 01/04/17 0930 01/05/17 0641 01/06/17 0520 01/07/17 0624  WBC 3.7* 6.4 8.9 5.1   Microbiology Recent Results (from the past 240 hour(s))  Culture, blood (routine x 2)     Status: None (Preliminary result)   Collection Time: 01/04/17  5:53 PM  Result Value Ref Range Status   Specimen Description BLOOD LEFT HAND  Final   Special Requests   Final    BOTTLES DRAWN AEROBIC ONLY Blood Culture adequate volume   Culture   Final    NO GROWTH 3 DAYS Performed at [facility], [place] [address], [place], [place] [address]    Report Status PENDING  Incomplete  Culture, blood (routine x 2)     Status: None (Preliminary result)   Collection Time: 01/04/17  6:01 PM  Result Value Ref Range Status   Specimen Description BLOOD BLOOD LEFT HAND  Final   Special Requests IN PEDIATRIC BOTTLE Blood Culture adequate volume  Final   Culture   Final    NO GROWTH 3 DAYS Performed at [facility], [place] [address], [place], [place] [address]    Report Status PENDING  Incomplete  Body fluid culture     Status: None (Preliminary result)   Collection Time: 01/05/17  4:33 PM  Result Value Ref Range Status   Specimen Description BILE  Final   Special Requests Normal  Final   Gram Stain   Final    NO WBC SEEN FEW GRAM POSITIVE COCCI IN CLUSTERS Performed at [facility], [place] [address], [place], [place] [address]    Culture MODERATE STAPHYLOCOCCUS HAEMOLYTICUS  Final   Report Status PENDING  Incomplete     Medications:   . enoxaparin (LOVENOX) injection  40 mg Subcutaneous Q24H  . famotidine  20 mg Oral BID  . methocarbamol  500 mg Oral QID  . metoCLOPramide  10 mg Oral TID AC  . nadolol  80 mg Oral Daily  . oxyCODONE  10 mg Oral Q12H    . pantoprazole  40 mg Oral Q12H  . polyethylene glycol  17 g Oral Daily  . potassium chloride SA  20 mEq Oral Daily  . sodium chloride flush  5 mL Intravenous Q8H  . vitamin B-12  2,500 mcg Oral Daily   Continuous Infusions: . piperacillin-tazobactam (ZOSYN)  IV Stopped (01/08/17 0537)      LOS: 3 days   Charlynne Cousins  [facility] Pager [phone]  *Please refer to [email or web address], password [email or web address] to get updated schedule on who will round on this patient, as hospitalists switch teams weekly. If 7PM-7AM, please contact night-coverage at [email or web address], password [email or web address] for any overnight needs.  01/08/2017, 8:27 AM

## 2017-01-09 LAB — CULTURE, BLOOD (ROUTINE X 2)
CULTURE: NO GROWTH
Culture: NO GROWTH
SPECIAL REQUESTS: ADEQUATE
Special Requests: ADEQUATE

## 2017-01-09 LAB — CBC WITH DIFFERENTIAL/PLATELET
BASOS ABS: 0 10*3/uL (ref 0.0–0.1)
BASOS PCT: 0 %
EOS ABS: 0.2 10*3/uL (ref 0.0–0.7)
Eosinophils Relative: 5 %
HCT: 31 % — ABNORMAL LOW (ref 39.0–52.0)
HEMOGLOBIN: 10.3 g/dL — AB (ref 13.0–17.0)
Lymphocytes Relative: 28 %
Lymphs Abs: 1.2 10*3/uL (ref 0.7–4.0)
MCH: 32.3 pg (ref 26.0–34.0)
MCHC: 33.2 g/dL (ref 30.0–36.0)
MCV: 97.2 fL (ref 78.0–100.0)
Monocytes Absolute: 0.5 10*3/uL (ref 0.1–1.0)
Monocytes Relative: 12 %
NEUTROS PCT: 55 %
Neutro Abs: 2.3 10*3/uL (ref 1.7–7.7)
PLATELETS: 130 10*3/uL — AB (ref 150–400)
RBC: 3.19 MIL/uL — AB (ref 4.22–5.81)
RDW: 16.8 % — AB (ref 11.5–15.5)
WBC: 4.2 10*3/uL (ref 4.0–10.5)

## 2017-01-09 LAB — GLUCOSE, CAPILLARY: GLUCOSE-CAPILLARY: 97 mg/dL (ref 65–99)

## 2017-01-09 LAB — COMPREHENSIVE METABOLIC PANEL
ALT: 62 U/L (ref 17–63)
AST: 33 U/L (ref 15–41)
Albumin: 2.9 g/dL — ABNORMAL LOW (ref 3.5–5.0)
Alkaline Phosphatase: 156 U/L — ABNORMAL HIGH (ref 38–126)
Anion gap: 10 (ref 5–15)
BUN: 10 mg/dL (ref 6–20)
CHLORIDE: 98 mmol/L — AB (ref 101–111)
CO2: 28 mmol/L (ref 22–32)
CREATININE: 1 mg/dL (ref 0.61–1.24)
Calcium: 8.8 mg/dL — ABNORMAL LOW (ref 8.9–10.3)
GFR calc Af Amer: >60 mL/min (ref >60)
GFR calc non Af Amer: >60 mL/min (ref >60)
Glucose, Bld: 89 mg/dL (ref 65–99)
POTASSIUM: 3.8 mmol/L (ref 3.5–5.1)
SODIUM: 136 mmol/L (ref 135–145)
Total Bilirubin: 2.2 mg/dL — ABNORMAL HIGH (ref 0.3–1.2)
Total Protein: 7.1 g/dL (ref 6.5–8.1)

## 2017-01-09 MED ORDER — VANCOMYCIN HCL IN DEXTROSE 1-5 GM/200ML-% IV SOLN
1000.0000 mg | Freq: Three times a day (TID) | INTRAVENOUS | Status: DC
  Administered 2017-01-09 – 2017-01-11 (×7): 1000 mg via INTRAVENOUS
  Filled 2017-01-09 (×6): qty 200

## 2017-01-09 MED ORDER — SODIUM CHLORIDE 0.9% FLUSH: 10.0000 mL | INTRAVENOUS | Status: DC | PRN

## 2017-01-09 NOTE — Progress Notes (Addendum)
Pharmacy Antibiotic Note  Bryan Fields is a 56 y.o. male with metastatic cancer and  biliary obstruction on lonsurf PTA presented to [facility] on 01/04/17 for ERCP.  Zosyn was started on admission for empiric coverage for intra-abdominal infection.  Bile fluid cx on 01/05/17 with moderate staph haemolyticus. To start vancomycin on 01/09/17.   Plan: - vancomycin 1000 mg IV q8h - continue zosyn 3.375 gm IV q8h (infuse over 4 hours) per MD - daily serum creatinine  Adden (1PM): d/c zosyn per Dr. Starla Link ____________________________  Height: 5\' 11"  (180.3 cm) Weight: 215 lb 13.3 oz (97.9 kg) IBW/kg (Calculated) : 75.3  Temp (24hrs), Avg:98.8 F (37.1 C), Min:98.2 F (36.8 C), Max:99.3 F (37.4 C)   Recent Labs Lab 01/04/17 0930 01/05/17 0641 01/06/17 0520 01/07/17 0624 01/09/17 0500  WBC 3.7* 6.4 8.9 5.1 4.2  CREATININE 1.15 1.15 1.08 1.04 1.00    Estimated Creatinine Clearance: 96 mL/min (by C-G formula based on SCr of 1 mg/dL).    Allergies  Allergen Reactions  . Other     Chemo therapy drug: Oxali Caused itching and redness.     Antimicrobials this admission:  7/30 zosyn>> 8/4 vanc>>  Microbiology results:  7/3 bcx x2: 1/2 klebsiella (R= amp) FINAL 7/5 bcx x2: neg FINAL  7/30 BCx x2: ngtd 7/31 bile fluid: Mod Staph haemolyticus (S= rifam, tetra, vanc)  Thank you for allowing pharmacy to be a part of this patient's care.  Lynelle Doctor 01/09/2017 10:59 AM

## 2017-01-09 NOTE — Progress Notes (Signed)
Patient ID: Bryan Fields, male   DOB: March 19, 1961, 56 y.o.   MRN: [id number]  PROGRESS NOTE    Bryan Fields  JJH:[id number] DOB: 11-16-1960 DOA: 01/04/2017 PCP: Ria Bush, MD   Brief Narrative:  56 y.o. male past medical history of colon cancer metastatic to the liver status post hepatectomy with biliary obstruction status post stent placement presented with abdominal pain was found to be bradycardic and with fever started empirically on antibiotics. Patient had ERCP and  removal of plastic stent (metal stent still in place) by GI and internal and external biliary drain placed by IR on 01/04/2017.GI & IR are following the patient. Bile culture is growing staph hemolyticus. Patient is still in significant pain but improving.  Assessment & Plan:   Principal Problem:   Disorder of bile duct stent Active Problems:   Primary colon cancer with metastasis to other site [facility])   Essential hypertension   Metastatic colon cancer to liver [facility])   Malignant neoplasm of liver (HCC)   Biliary obstruction  Biliary obstruction due to metastatic colon cancer to liver Status post ERCP with no significant improvement, ERCP repeated on 01/05/1999 by GI with plastic stent removal (metallic stent still in place). S/P internal and external biliary drain placed by IR,  and now bilirubin is slowly trending down. Bile culture is growing staph hemolyticus. Will start iv vancomycin while inpatient; consult pharmacy for the same. Pain is slightly better controlled. Continue current pain management regimen. he'll benefit from outpatient follow-up with pain management  Sepsis: Improving. Blood cultures negative. Bile culture as noted above  Primary colon cancer with metastasis Follow-up with Dr. Learta Codding as an outpatient; continue to hold chemotherapy.  Essential hypertension: Continue Nadalol.   DVT prophylaxis: Lovenox Code Status:  Full Family Communication: Discussed with wife present at  bedside Disposition Plan: Discharge in one to 2 days  Consultants: GI and IR   Procedures: ERCP with removal of plastic stent on 01/04/2017. Biliary stent placement by IR on 01/04/2017  Antimicrobials: Zosyn from 01/04/2017 on works Subjective: Patient seen and examined at bedside. He thinks his pain is slightly improved but is still in significant pain. No overnight fever or vomiting.  Objective: Vitals:   01/08/17 1322 01/08/17 2121 01/09/17 0510 01/09/17 0730  BP: 128/67 121/67 126/76   Pulse: 73 76 70   Resp: 16 18 16    Temp: 99 F (37.2 C) 99.3 F (37.4 C) 98.2 F (36.8 C)   TempSrc: Oral Oral Oral   SpO2: 100% 100% 100%   Weight:    97.9 kg (215 lb 13.3 oz)  Height:        Intake/Output Summary (Last 24 hours) at 01/09/17 1035 Last data filed at 01/09/17 1012  Gross per 24 hour  Intake              775 ml  Output              525 ml  Net              250 ml   Filed Weights   01/07/17 0452 01/08/17 0453 01/09/17 0730  Weight: 101.2 kg (223 lb) 101.6 kg (223 lb 14.4 oz) 97.9 kg (215 lb 13.3 oz)    Examination:  General exam: Appears calm and comfortable  Respiratory system: Bilateral decreased breath sound at bases Cardiovascular system: S1 & S2 heard, Rate controlled.  Gastrointestinal system: Abdomen is nondistended, soft and mildly tender in the right upper quadrant. Normal bowel sounds heard.  Percutaneous Biliary drain present Extremities: No cyanosis, clubbing; trace pedal edema    Data Reviewed: I have personally reviewed following labs and imaging studies  CBC:  Recent Labs Lab 01/04/17 0930 01/05/17 0641 01/06/17 0520 01/07/17 0624 01/09/17 0500  WBC 3.7* 6.4 8.9 5.1 4.2  NEUTROABS 2.5  --   --  2.9 2.3  HGB 11.5* 11.3* 12.0* 11.3* 10.3*  HCT 34.2* 33.9* 36.2* 33.8* 31.0*  MCV 97.2 98.8 97.8 98.5 97.2  PLT 145* 130* 131* 145* 341*   Basic Metabolic Panel:  Recent Labs Lab 01/04/17 0930 01/05/17 0641 01/06/17 0520 01/07/17 0624  01/09/17 0500  NA 140 137 137 134* 136  K 4.0 3.8 3.7 4.1 3.8  CL 106 104 103 100* 98*  CO2 25 26 22 26 28   GLUCOSE 108* 109* 101* 103* 89  BUN 17 15 17 18 10   CREATININE 1.15 1.15 1.08 1.04 1.00  CALCIUM 8.9 8.7* 9.0 8.9 8.8*  MG  --   --   --  1.8  --    GFR: Estimated Creatinine Clearance: 96 mL/min (by C-G formula based on SCr of 1 mg/dL). Liver Function Tests:  Recent Labs Lab 01/04/17 0930 01/05/17 0641 01/06/17 0520 01/07/17 0624 01/09/17 0500  AST 151* 244* 148* 69* 33  ALT 121* 196* 163* 113* 62  ALKPHOS 307* 343* 286* 225* 156*  BILITOT 2.3* 4.5* 3.7* 2.7* 2.2*  PROT 7.5 7.0 7.6 7.6 7.1  ALBUMIN 3.4* 3.0* 3.2* 3.1* 2.9*    Recent Labs Lab 01/07/17 0624  AMYLASE 16*   No results for input(s): AMMONIA in the last 168 hours. Coagulation Profile:  Recent Labs Lab 01/04/17 0930  INR 1.00   Cardiac Enzymes: No results for input(s): CKTOTAL, CKMB, CKMBINDEX, TROPONINI in the last 168 hours. BNP (last 3 results) No results for input(s): PROBNP in the last 8760 hours. HbA1C: No results for input(s): HGBA1C in the last 72 hours. CBG:  Recent Labs Lab 01/05/17 0714 01/06/17 0804 01/07/17 0712 01/08/17 0810 01/09/17 0729  GLUCAP 95 104* 89 119* 97   Lipid Profile: No results for input(s): CHOL, HDL, LDLCALC, TRIG, CHOLHDL, LDLDIRECT in the last 72 hours. Thyroid Function Tests: No results for input(s): TSH, T4TOTAL, FREET4, T3FREE, THYROIDAB in the last 72 hours. Anemia Panel: No results for input(s): VITAMINB12, FOLATE, FERRITIN, TIBC, IRON, RETICCTPCT in the last 72 hours. Sepsis Labs: No results for input(s): PROCALCITON, LATICACIDVEN in the last 168 hours.  Recent Results (from the past 240 hour(s))  Culture, blood (routine x 2)     Status: None (Preliminary result)   Collection Time: 01/04/17  5:53 PM  Result Value Ref Range Status   Specimen Description BLOOD LEFT HAND  Final   Special Requests   Final    BOTTLES DRAWN AEROBIC ONLY Blood  Culture adequate volume   Culture   Final    NO GROWTH 4 DAYS Performed at [facility], [address], [place], [place] [address]    Report Status PENDING  Incomplete  Culture, blood (routine x 2)     Status: None (Preliminary result)   Collection Time: 01/04/17  6:01 PM  Result Value Ref Range Status   Specimen Description BLOOD BLOOD LEFT HAND  Final   Special Requests IN PEDIATRIC BOTTLE Blood Culture adequate volume  Final   Culture   Final    NO GROWTH 4 DAYS Performed at [facility], [place] [address], [place], [place] [address]    Report Status PENDING  Incomplete  Body fluid culture     Status: None   Collection Time: 01/05/17  4:33 PM  Result Value Ref Range Status   Specimen Description BILE  Final   Special Requests Normal  Final   Gram Stain   Final    NO WBC SEEN FEW GRAM POSITIVE COCCI IN CLUSTERS Performed at [facility], [address], [place], [place] [address]    Culture MODERATE STAPHYLOCOCCUS HAEMOLYTICUS  Final   Report Status 01/08/2017 FINAL  Final   Organism ID, Bacteria STAPHYLOCOCCUS HAEMOLYTICUS  Final      Susceptibility   Staphylococcus haemolyticus - MIC*    CIPROFLOXACIN >=8 RESISTANT Resistant     ERYTHROMYCIN >=8 RESISTANT Resistant     GENTAMICIN >=16 RESISTANT Resistant     OXACILLIN >=4 RESISTANT Resistant     TETRACYCLINE <=1 SENSITIVE Sensitive     VANCOMYCIN 1 SENSITIVE Sensitive     TRIMETH/SULFA >=320 RESISTANT Resistant     CLINDAMYCIN >=8 RESISTANT Resistant     RIFAMPIN <=0.5 SENSITIVE Sensitive     Inducible Clindamycin NEGATIVE Sensitive     * MODERATE STAPHYLOCOCCUS HAEMOLYTICUS         Radiology Studies: No results found.      Scheduled Meds: . enoxaparin (LOVENOX) injection  40 mg Subcutaneous Q24H  . famotidine  20 mg Oral BID  . methocarbamol  500 mg Oral QID  . metoCLOPramide  10 mg Oral TID AC  . nadolol  80 mg Oral Daily  . oxyCODONE  15 mg Oral Q12H  . pantoprazole  40 mg Oral Q12H   . polyethylene glycol  17 g Oral Daily  . potassium chloride SA  20 mEq Oral Daily  . sodium chloride flush  5 mL Intravenous Q8H  . vitamin B-12  2,500 mcg Oral Daily   Continuous Infusions: . piperacillin-tazobactam (ZOSYN)  IV 3.375 g (01/09/17 0957)     LOS: 4 days        Aline August, MD [facility] Pager [phone]  If 7PM-7AM, please contact night-coverage [email or web address] Password [email or web address] 01/09/2017, 10:35 AM

## 2017-01-10 DIAGNOSIS — R1011 Right upper quadrant pain: Secondary | ICD-10-CM

## 2017-01-10 LAB — CREATININE, SERUM
Creatinine, Ser: 0.97 mg/dL (ref 0.61–1.24)
GFR calc Af Amer: >60 mL/min (ref >60)
GFR calc non Af Amer: >60 mL/min (ref >60)

## 2017-01-10 MED ORDER — OXYCODONE HCL ER 15 MG PO T12A
30.0000 mg | EXTENDED_RELEASE_TABLET | Freq: Two times a day (BID) | ORAL | Status: DC
  Administered 2017-01-10 – 2017-01-12 (×5): 30 mg via ORAL
  Filled 2017-01-10 (×5): qty 2

## 2017-01-10 MED ORDER — HYDROMORPHONE HCL 2 MG PO TABS
2.0000 mg | ORAL_TABLET | ORAL | Status: DC | PRN
  Administered 2017-01-10 – 2017-01-11 (×6): 2 mg via ORAL
  Filled 2017-01-10 (×5): qty 1

## 2017-01-10 NOTE — Progress Notes (Signed)
Patient ID: Bryan Fields, male   DOB: September 28, 1960, 56 y.o.   MRN: [id number]  PROGRESS NOTE    Bryan Fields  JME:[id number] DOB: 1961-01-09 DOA: 01/04/2017 PCP: Ria Bush, MD   Brief Narrative:  56 y.o.malepast medical history of colon cancer metastatic to the liver status post hepatectomy with biliary obstruction status post stent placement presented with abdominal pain was found to be bradycardic and with fever started empirically on antibiotics. Patient had ERCP and  removal of plastic stent (metal stent still in place) by GI and  external biliary drain placed by IR on 01/04/2017.GI & IR are following the patient. Bile culture is growing staph hemolyticus. Patient is still in significant pain.  Assessment & Plan:   Principal Problem:   Disorder of bile duct stent Active Problems:   Primary colon cancer with metastasis to other site [name])   Essential hypertension   Metastatic colon cancer to liver [facility])   Malignant neoplasm of liver (HCC)   Biliary obstruction    Biliary obstruction due to metastatic colon cancer to liver Status post ERCP with no significant improvement, ERCP repeated on 01/05/1999 by GI with plastic stent removal (metallic stent still in place). S/P external biliary drain placed by IR, and now bilirubin is slowly trending down. Repeat a.m. labs including LFTs Bile culture is growing staph hemolyticus. Zosyn discontinued. Continue vancomycin. Switch to oral doxycycline on discharge. Still has significant amount of pain and is requiring oral Dilaudid more frequently. Increase OxyContin to 30 mg by mouth twice a day. he'll benefit from outpatient follow-up with pain management. Continue bowel regimen  Sepsis: Improved. Blood cultures negative. Bile culture as noted above  Primary colon cancer with metastasis Follow-up with Dr. Learta Codding as an outpatient; continue to hold chemotherapy.  Essential hypertension: Continue Nadalol.   DVT  prophylaxis: Lovenox Code Status:  Full Family Communication: Discussed with wife present at bedside Disposition Plan: Discharge in one to 2 days  Consultants: GI and IR   Procedures: ERCP with removal of plastic stent on 01/04/2017. Biliary stent placement by IR on 01/04/2017  Antimicrobials: Zosyn from 01/04/2017- 01/09/2017. Vancomycin from 01/09/2017 onwards  Subjective: Patient seen and examined at bedside. She still complains of significant abdominal pain. No overnight fever, nausea or vomiting.  Objective: Vitals:   01/09/17 1441 01/09/17 2006 01/10/17 0147 01/10/17 0519  BP: 110/66 112/69  116/62  Pulse: 66 67  69  Resp: 16 18  16   Temp: 98.6 F (37 C) 98.6 F (37 C)  98.1 F (36.7 C)  TempSrc: Oral Oral  Oral  SpO2: 99% 100%  100%  Weight:   97.5 kg (215 lb)   Height:        Intake/Output Summary (Last 24 hours) at 01/10/17 1005 Last data filed at 01/10/17 0433  Gross per 24 hour  Intake              655 ml  Output              475 ml  Net              180 ml   Filed Weights   01/08/17 0453 01/09/17 0730 01/10/17 0147  Weight: 101.6 kg (223 lb 14.4 oz) 97.9 kg (215 lb 13.3 oz) 97.5 kg (215 lb)    Examination:  General exam: Appears calm and comfortable  Respiratory system: Bilateral decreased breath sound at bases With scattered crackles Cardiovascular system: S1 & S2 heard, rate controlled  Gastrointestinal system: Abdomen is  nondistended, soft and mildly tender in the right upper quadrant. Normal bowel sounds heard. Percutaneous biliary drain present Extremities: No cyanosis, clubbing, edema   Data Reviewed: I have personally reviewed following labs and imaging studies  CBC:  Recent Labs Lab 01/04/17 0930 01/05/17 0641 01/06/17 0520 01/07/17 0624 01/09/17 0500  WBC 3.7* 6.4 8.9 5.1 4.2  NEUTROABS 2.5  --   --  2.9 2.3  HGB 11.5* 11.3* 12.0* 11.3* 10.3*  HCT 34.2* 33.9* 36.2* 33.8* 31.0*  MCV 97.2 98.8 97.8 98.5 97.2  PLT 145* 130* 131*  145* 086*   Basic Metabolic Panel:  Recent Labs Lab 01/04/17 0930 01/05/17 0641 01/06/17 0520 01/07/17 0624 01/09/17 0500 01/10/17 0417  NA 140 137 137 134* 136  --   K 4.0 3.8 3.7 4.1 3.8  --   CL 106 104 103 100* 98*  --   CO2 25 26 22 26 28   --   GLUCOSE 108* 109* 101* 103* 89  --   BUN 17 15 17 18 10   --   CREATININE 1.15 1.15 1.08 1.04 1.00 0.97  CALCIUM 8.9 8.7* 9.0 8.9 8.8*  --   MG  --   --   --  1.8  --   --   GFR: Estimated Creatinine Clearance: 98.9 mL/min (by C-G formula based on SCr of 0.97 mg/dL). Liver Function Tests:  Recent Labs Lab 01/04/17 0930 01/05/17 0641 01/06/17 0520 01/07/17 0624 01/09/17 0500  AST 151* 244* 148* 69* 33  ALT 121* 196* 163* 113* 62  ALKPHOS 307* 343* 286* 225* 156*  BILITOT 2.3* 4.5* 3.7* 2.7* 2.2*  PROT 7.5 7.0 7.6 7.6 7.1  ALBUMIN 3.4* 3.0* 3.2* 3.1* 2.9*    Recent Labs Lab 01/07/17 0624  AMYLASE 16*   No results for input(s): AMMONIA in the last 168 hours. Coagulation Profile:  Recent Labs Lab 01/04/17 0930  INR 1.00   Cardiac Enzymes: No results for input(s): CKTOTAL, CKMB, CKMBINDEX, TROPONINI in the last 168 hours. BNP (last 3 results) No results for input(s): PROBNP in the last 8760 hours. HbA1C: No results for input(s): HGBA1C in the last 72 hours. CBG:  Recent Labs Lab 01/05/17 0714 01/06/17 0804 01/07/17 0712 01/08/17 0810 01/09/17 0729  GLUCAP 95 104* 89 119* 97   Lipid Profile: No results for input(s): CHOL, HDL, LDLCALC, TRIG, CHOLHDL, LDLDIRECT in the last 72 hours. Thyroid Function Tests: No results for input(s): TSH, T4TOTAL, FREET4, T3FREE, THYROIDAB in the last 72 hours. Anemia Panel: No results for input(s): VITAMINB12, FOLATE, FERRITIN, TIBC, IRON, RETICCTPCT in the last 72 hours. Sepsis Labs: No results for input(s): PROCALCITON, LATICACIDVEN in the last 168 hours.  Recent Results (from the past 240 hour(s))  Culture, blood (routine x 2)     Status: None   Collection Time:  01/04/17  5:53 PM  Result Value Ref Range Status   Specimen Description BLOOD LEFT HAND  Final   Special Requests   Final    BOTTLES DRAWN AEROBIC ONLY Blood Culture adequate volume   Culture   Final    NO GROWTH 5 DAYS Performed at [facility], [address] (metallic stent still in place). S/P external biliary drain placed by IR, and now bilirubin is slowly trending down. Repeat a.m. labs including LFTs Bile culture is growing staph hemolyticus. Zosyn discontinued. Continue vancomycin. Switch to oral doxycycline on discharge. Still has significant amount of pain and is requiring oral Dilaudid more frequently. Increase OxyContin to 30 mg by mouth twice a day. he'll benefit from outpatient follow-up with pain management. Continue bowel regimen  Sepsis: Improved. Blood cultures negative. Bile culture as noted above  Primary colon cancer with metastasis Follow-up with Dr. Learta Codding as an outpatient; continue to hold chemotherapy.  Essential hypertension: Continue Nadalol.   DVT  prophylaxis: Lovenox Code Status:  Full Family Communication: Discussed with wife present at bedside Disposition Plan: Discharge in one to 2 days  Consultants: GI and IR   Procedures: ERCP with removal of plastic stent on 01/04/2017. Biliary stent placement by IR on 01/04/2017  Antimicrobials: Zosyn from 01/04/2017- 01/09/2017. Vancomycin from 01/09/2017 onwards  Subjective: Patient seen and examined at bedside. She still complains of significant abdominal pain. No overnight fever, nausea or vomiting.  Objective: Vitals:   01/09/17 1441 01/09/17 2006 01/10/17 0147 01/10/17 0519  BP: 110/66 112/69  116/62  Pulse: 66 67  69  Resp: 16 18  16   Temp: 98.6 F (37 C) 98.6 F (37 C)  98.1 F (36.7 C)  TempSrc: Oral Oral  Oral  SpO2: 99% 100%  100%  Weight:   97.5 kg (215 lb)   Height:        Intake/Output Summary (Last 24 hours) at 01/10/17 1005 Last data filed at 01/10/17 0433  Gross per 24 hour  Intake              655 ml  Output              475 ml  Net              180 ml   Filed Weights   01/08/17 0453 01/09/17 0730 01/10/17 0147  Weight: 101.6 kg (223 lb 14.4 oz) 97.9 kg (215 lb 13.3 oz) 97.5 kg (215 lb)    Examination:  General exam: Appears calm and comfortable  Respiratory system: Bilateral decreased breath sound at bases With scattered crackles Cardiovascular system: S1 & S2 heard, rate controlled  Gastrointestinal system: Abdomen is  nondistended, soft and mildly tender in the right upper quadrant. Normal bowel sounds heard. Percutaneous biliary drain present Extremities: No cyanosis, clubbing, edema   Data Reviewed: I have personally reviewed following labs and imaging studies  CBC:  Recent Labs Lab 01/04/17 0930 01/05/17 0641 01/06/17 0520 01/07/17 0624 01/09/17 0500  WBC 3.7* 6.4 8.9 5.1 4.2  NEUTROABS 2.5  --   --  2.9 2.3  HGB 11.5* 11.3* 12.0* 11.3* 10.3*  HCT 34.2* 33.9* 36.2* 33.8* 31.0*  MCV 97.2 98.8 97.8 98.5 97.2  PLT 145* 130* 131*  145* 086*   Basic Metabolic Panel:  Recent Labs Lab 01/04/17 0930 01/05/17 0641 01/06/17 0520 01/07/17 0624 01/09/17 0500 01/10/17 0417  NA 140 137 137 134* 136  --   K 4.0 3.8 3.7 4.1 3.8  --   CL 106 104 103 100* 98*  --   CO2 25 26 22 26 28   --   GLUCOSE 108* 109* 101* 103* 89  --   BUN 17 15 17 18 10   --   CREATININE 1.15 1.15 1.08 1.04 1.00 0.97  CALCIUM 8.9 8.7* 9.0 8.9 8.8*  --   MG  --   --   --  1.8  --   --    GFR: Estimated Creatinine Clearance: 98.9 mL/min (by C-G formula based on SCr of 0.97 mg/dL). Liver Function Tests:  Recent Labs Lab 01/04/17 0930 01/05/17 0641 01/06/17 0520 01/07/17 0624 01/09/17 0500  AST 151* 244* 148* 69* 33  ALT 121* 196* 163* 113* 62  ALKPHOS 307* 343* 286* 225* 156*  BILITOT 2.3* 4.5* 3.7* 2.7* 2.2*  PROT 7.5 7.0 7.6 7.6 7.1  ALBUMIN 3.4* 3.0* 3.2* 3.1* 2.9*    Recent Labs Lab 01/07/17 0624  AMYLASE 16*   No results for input(s): AMMONIA in the last 168 hours. Coagulation Profile:  Recent Labs Lab 01/04/17 0930  INR 1.00   Cardiac Enzymes: No results for input(s): CKTOTAL, CKMB, CKMBINDEX, TROPONINI in the last 168 hours. BNP (last 3 results) No results for input(s): PROBNP in the last 8760 hours. HbA1C: No results for input(s): HGBA1C in the last 72 hours. CBG:  Recent Labs Lab 01/05/17 0714 01/06/17 0804 01/07/17 0712 01/08/17 0810 01/09/17 0729  GLUCAP 95 104* 89 119* 97   Lipid Profile: No results for input(s): CHOL, HDL, LDLCALC, TRIG, CHOLHDL, LDLDIRECT in the last 72 hours. Thyroid Function Tests: No results for input(s): TSH, T4TOTAL, FREET4, T3FREE, THYROIDAB in the last 72 hours. Anemia Panel: No results for input(s): VITAMINB12, FOLATE, FERRITIN, TIBC, IRON, RETICCTPCT in the last 72 hours. Sepsis Labs: No results for input(s): PROCALCITON, LATICACIDVEN in the last 168 hours.  Recent Results (from the past 240 hour(s))  Culture, blood (routine x 2)     Status: None   Collection Time:  01/04/17  5:53 PM  Result Value Ref Range Status   Specimen Description BLOOD LEFT HAND  Final   Special Requests   Final    BOTTLES DRAWN AEROBIC ONLY Blood Culture adequate volume   Culture   Final    NO GROWTH 5 DAYS Performed at [facility], [address] [place], [place] [address]    Report Status 01/09/2017 FINAL  Final  Culture, blood (routine x 2)     Status: None   Collection Time: 01/04/17  6:01 PM  Result Value Ref Range Status   Specimen Description BLOOD BLOOD LEFT HAND  Final   Special Requests IN PEDIATRIC BOTTLE Blood Culture adequate volume  Final   Culture   Final    NO GROWTH 5 DAYS Performed at [facility], [place] [address] [place], [place] [address]    Report Status 01/09/2017 FINAL  Final  Body fluid culture     Status: None   Collection Time: 01/05/17  4:33 PM  Result Value Ref Range Status   Specimen Description BILE  Final   Special Requests Normal  Final   Gram Stain   Final    NO WBC SEEN FEW GRAM POSITIVE COCCI IN CLUSTERS Performed at [facility], [place] [address] [place],  [address]    Culture MODERATE STAPHYLOCOCCUS HAEMOLYTICUS  Final   Report Status 01/08/2017 FINAL  Final   Organism ID, Bacteria STAPHYLOCOCCUS HAEMOLYTICUS  Final      Susceptibility   Staphylococcus haemolyticus - MIC*    CIPROFLOXACIN >=8 RESISTANT Resistant     ERYTHROMYCIN >=8 RESISTANT Resistant     GENTAMICIN >=16 RESISTANT Resistant     OXACILLIN >=4 RESISTANT Resistant     TETRACYCLINE <=1 SENSITIVE Sensitive     VANCOMYCIN 1 SENSITIVE Sensitive     TRIMETH/SULFA >=320 RESISTANT Resistant     CLINDAMYCIN >=8 RESISTANT Resistant     RIFAMPIN <=0.5 SENSITIVE Sensitive     Inducible Clindamycin NEGATIVE Sensitive     * MODERATE STAPHYLOCOCCUS HAEMOLYTICUS         Radiology Studies: No results found.      Scheduled Meds: . enoxaparin (LOVENOX) injection  40 mg Subcutaneous Q24H  . famotidine  20 mg Oral BID  . methocarbamol   500 mg Oral QID  . metoCLOPramide  10 mg Oral TID AC  . nadolol  80 mg Oral Daily  . oxyCODONE  30 mg Oral Q12H  . pantoprazole  40 mg Oral Q12H  . polyethylene glycol  17 g Oral Daily  . potassium chloride SA  20 mEq Oral Daily  . sodium chloride flush  5 mL Intravenous Q8H  . vitamin B-12  2,500 mcg Oral Daily   Continuous Infusions: . vancomycin Stopped (01/10/17 0331)     LOS: 5 days        Aline August, MD [facility] Pager [phone]  If 7PM-7AM, please contact night-coverage [email or web address] Password [email or web address] 01/10/2017, 10:05 AM

## 2017-01-10 NOTE — Progress Notes (Signed)
PT Cancellation Note  Patient Details Name: Bryan Fields MRN: [id number] DOB: 08-Dec-1960   Cancelled Treatment:    Reason Eval/Treat Not Completed: PT screened, no needs identified, will sign off. Spoke with family member (pt was in shower)-pt and family have been walking in hallways. Family denied any PT needs at this time. Will sign off. Please reorder if needs change.    Weston Anna, MPT Pager: [phone]

## 2017-01-11 ENCOUNTER — Inpatient Hospital Stay (HOSPITAL_COMMUNITY): Payer: 59

## 2017-01-11 DIAGNOSIS — C182 Malignant neoplasm of ascending colon: Secondary | ICD-10-CM

## 2017-01-11 DIAGNOSIS — G893 Neoplasm related pain (acute) (chronic): Secondary | ICD-10-CM

## 2017-01-11 DIAGNOSIS — R188 Other ascites: Secondary | ICD-10-CM

## 2017-01-11 LAB — CBC WITH DIFFERENTIAL/PLATELET
BASOS ABS: 0 10*3/uL (ref 0.0–0.1)
Basophils Relative: 0 %
EOS ABS: 0.2 10*3/uL (ref 0.0–0.7)
Eosinophils Relative: 3 %
HCT: 31.7 % — ABNORMAL LOW (ref 39.0–52.0)
Hemoglobin: 10.7 g/dL — ABNORMAL LOW (ref 13.0–17.0)
Lymphocytes Relative: 19 %
Lymphs Abs: 1.3 10*3/uL (ref 0.7–4.0)
MCH: 32.2 pg (ref 26.0–34.0)
MCHC: 33.8 g/dL (ref 30.0–36.0)
MCV: 95.5 fL (ref 78.0–100.0)
MONO ABS: 0.9 10*3/uL (ref 0.1–1.0)
Monocytes Relative: 13 %
NEUTROS PCT: 65 %
Neutro Abs: 4.4 10*3/uL (ref 1.7–7.7)
PLATELETS: 196 10*3/uL (ref 150–400)
RBC: 3.32 MIL/uL — AB (ref 4.22–5.81)
RDW: 16.8 % — ABNORMAL HIGH (ref 11.5–15.5)
WBC: 6.8 10*3/uL (ref 4.0–10.5)

## 2017-01-11 LAB — COMPREHENSIVE METABOLIC PANEL
ALT: 53 U/L (ref 17–63)
AST: 37 U/L (ref 15–41)
Albumin: 3 g/dL — ABNORMAL LOW (ref 3.5–5.0)
Alkaline Phosphatase: 134 U/L — ABNORMAL HIGH (ref 38–126)
Anion gap: 7 (ref 5–15)
BUN: 8 mg/dL (ref 6–20)
CHLORIDE: 102 mmol/L (ref 101–111)
CO2: 28 mmol/L (ref 22–32)
CREATININE: 1.05 mg/dL (ref 0.61–1.24)
Calcium: 8.9 mg/dL (ref 8.9–10.3)
GFR calc non Af Amer: >60 mL/min (ref >60)
Glucose, Bld: 108 mg/dL — ABNORMAL HIGH (ref 65–99)
Potassium: 3.6 mmol/L (ref 3.5–5.1)
SODIUM: 137 mmol/L (ref 135–145)
Total Bilirubin: 2 mg/dL — ABNORMAL HIGH (ref 0.3–1.2)
Total Protein: 7.2 g/dL (ref 6.5–8.1)

## 2017-01-11 LAB — VANCOMYCIN, TROUGH: VANCOMYCIN TR: 30 ug/mL — AB (ref 15–20)

## 2017-01-11 LAB — MAGNESIUM: Magnesium: 1.8 mg/dL (ref 1.7–2.4)

## 2017-01-11 MED ORDER — FUROSEMIDE 40 MG PO TABS
40.0000 mg | ORAL_TABLET | Freq: Every day | ORAL | Status: DC
  Filled 2017-01-11 (×2): qty 1

## 2017-01-11 MED ORDER — HYDROMORPHONE HCL 4 MG PO TABS
4.0000 mg | ORAL_TABLET | ORAL | Status: DC | PRN
  Administered 2017-01-11 – 2017-01-12 (×5): 4 mg via ORAL
  Filled 2017-01-11 (×5): qty 1

## 2017-01-11 NOTE — Progress Notes (Signed)
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  Vancomycin trough level now back supra-therapeutic at 30 (goal 15-20)  - scr stable at 1.05  Plan: - hold vancomycin for now - check random level with AM labs and re- dose if < [place], PharmD, BCPS 01/11/2017 6:56 PM

## 2017-01-11 NOTE — Progress Notes (Signed)
Patient ID: Bryan Fields, male   DOB: [birth year], 58 y.o.   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 Patient had ERCP and removal of plastic stent (metal stent still in place) by GI and  external/internal biliary drain placed by IR on 01/04/2017.GI & IR arefollowing the patient. Bile culture is growing staph hemolyticus. Patient is still in significant pain.  Assessment & Plan:   Principal Problem:   Disorder of bile duct stent Active Problems:   Primary colon cancer with metastasis to other site [facility])   Essential hypertension   Metastatic colon cancer to liver [facility])   Malignant neoplasm of liver (HCC)   Biliary obstruction   Biliary obstruction due to metastatic colon cancer to liver Status post ERCP with no significant improvement, ERCP repeated on 01/05/1999 by GI with plastic stent removal (metallic stent still in place). S/P external/internal biliary drain placed by IR,and now bilirubin is slowly trending down. Repeat a.m. labs including LFTs Bile culture is growing staph hemolyticus. Zosyn discontinued. Continue vancomycin. Switch to oral doxycycline on discharge. Currently on OxyContin 30 mg twice a day. Still having significant breakthrough pain. Increase Dilaudid 4 mg every 4 hours when necessary.   he'll benefit from outpatient follow-up with pain management. Continue bowel regimen  Sepsis: Improved. Blood cultures negative. Bile culture as noted above  Primary colon cancer with metastasis Follow-up with Dr. Learta Codding asan outpatient; continue to hold chemotherapy. - Ultrasound guided paracentesis as abdomen  looks more distended; start Lasix 40 mg daily and monitor creatinine  Essential hypertension: Continue Nadalol.   DVT prophylaxis:Lovenox Code Status:Full Family Communication:Discussed with wife present at bedside Disposition Plan:Discharge in one to 2 days  Consultants:GI and IR   Procedures:ERCP with removal of plastic stent on 01/04/2017. Biliary stent placement by IR on 01/04/2017  Antimicrobials: Zosyn from 01/04/2017- 01/09/2017. Vancomycin from 01/09/2017 onwards  Subjective: Patient seen and examined at bedside. He still has significant breakthrough pain. No overnight fever, nausea or vomiting. Thinks that his abdomen is distended more today.  Objective: Vitals:   01/10/17 0519 01/10/17 1432 01/10/17 2007 01/11/17 0609  BP: 116/62 113/72 124/84 132/66  Pulse: 69 73 73 80  Resp: 16 16 16 16   Temp: 98.1 F (36.7 C) 98.5 F (36.9 C) 98.4 F (36.9 C) 99.4 F (37.4 C)  TempSrc: Oral Oral Oral Oral  SpO2: 100% 99% 100% 100%  Weight:    97.5 kg (215 lb)  Height:        Intake/Output Summary (Last 24 hours) at 01/11/17 1240 Last data filed at 01/11/17 0615  Gross per 24 hour  Intake              600 ml  Output              370 ml  Net              230 ml   Filed Weights   01/09/17 0730 01/10/17 0147 01/11/17 0609  Weight: 97.9 kg (215 lb 13.3 oz) 97.5 kg (215 lb) 97.5 kg (215 lb)    Examination:  General exam: Appears calm and comfortable  Respiratory system: Bilateral decreased breath sound at bases With scattered  crackles Cardiovascular system: S1 & S2 heard, rate controlled.  Gastrointestinal system: Abdomen is distended, soft and mildly tender in the periumbilical region. Normal bowel sounds heard. Extremities: No cyanosis, clubbing; trace pedal edema   Data Reviewed: I have personally reviewed following labs and imaging studies  CBC:  Recent Labs Lab 01/05/17 0641 01/06/17 0520 01/07/17 0624 01/09/17 0500 01/11/17 0500  WBC 6.4  8.9 5.1 4.2 6.8  NEUTROABS  --   --  2.9 2.3 4.4  HGB 11.3* 12.0* 11.3* 10.3* 10.7*  HCT 33.9* 36.2* 33.8* 31.0* 31.7*  MCV 98.8 97.8 98.5 97.2 95.5  PLT 130* 131* 145* 130* 841   Basic Metabolic Panel:  Recent Labs Lab 01/05/17 0641 01/06/17 0520 01/07/17 0624 01/09/17 0500 01/10/17 0417 01/11/17 0500  NA 137 137 134* 136  --  137  K 3.8 3.7 4.1 3.8  --  3.6  CL 104 103 100* 98*  --  102  CO2 26 22 26 28   --  28  GLUCOSE 109* 101* 103* 89  --  108*  BUN 15 17 18 10   --  8  CREATININE 1.15 1.08 1.04 1.00 0.97 1.05  CALCIUM 8.7* 9.0 8.9 8.8*  --  8.9  MG  --   --  1.8  --   --  1.8   GFR: Estimated Creatinine Clearance: 91.3 mL/min (by C-G formula based on SCr of 1.05 mg/dL). Liver Function Tests:  Recent Labs Lab 01/05/17 0641 01/06/17 0520 01/07/17 0624 01/09/17 0500 01/11/17 0500  AST 244* 148* 69* 33 37  ALT 196* 163* 113* 62 53  ALKPHOS 343* 286* 225* 156* 134*  BILITOT 4.5* 3.7* 2.7* 2.2* 2.0*  PROT 7.0 7.6 7.6 7.1 7.2  ALBUMIN 3.0* 3.2* 3.1* 2.9* 3.0*    Recent Labs Lab 01/07/17 0624  AMYLASE 16*   No results for input(s): AMMONIA in the last 168 hours. Coagulation Profile: No results for input(s): INR, PROTIME in the last 168 hours. Cardiac Enzymes: No results for input(s): CKTOTAL, CKMB, CKMBINDEX, TROPONINI in the last 168 hours. BNP (last 3 results) No results for input(s): PROBNP in the last 8760 hours. HbA1C: No results for input(s): HGBA1C in the last 72 hours. CBG:  Recent Labs Lab 01/05/17 0714 01/06/17 0804 01/07/17 0712 01/08/17 0810 01/09/17 0729  GLUCAP 95 104* 89 119* 97   Lipid Profile: No results for input(s): CHOL, HDL, LDLCALC, TRIG, CHOLHDL, LDLDIRECT in the last 72 hours. Thyroid Function Tests: No results for input(s): TSH, T4TOTAL, FREET4, T3FREE, THYROIDAB in the last 72 hours. Anemia Panel: No results for input(s): VITAMINB12, FOLATE, FERRITIN, TIBC, IRON, RETICCTPCT in the last 72 hours. Sepsis Labs: No  results for input(s): PROCALCITON, LATICACIDVEN in the last 168 hours.  Recent Results (from the past 240 hour(s))  Culture, blood (routine x 2)     Status: None   Collection Time: 01/04/17  5:53 PM  Result Value Ref Range Status   Specimen Description BLOOD LEFT HAND  Final   Special Requests   Final    BOTTLES DRAWN AEROBIC ONLY Blood Culture adequate volume   Culture   Final    NO GROWTH 5 DAYS Performed at [facility], [address] [place], [place] [address]    Report Status 01/09/2017 FINAL  Final  Culture, blood (routine x 2)     Status: None   Collection Time: 01/04/17  6:01 PM  Result Value Ref Range Status   Specimen Description BLOOD BLOOD LEFT HAND  Final  Special Requests IN PEDIATRIC BOTTLE Blood Culture adequate volume  Final   Culture   Final    NO GROWTH 5 DAYS Performed at [facility], [place] [address] [place], [place] [address]    Report Status 01/09/2017 FINAL  Final  Body fluid culture     Status: None   Collection Time: 01/05/17  4:33 PM  Result Value Ref Range Status   Specimen Description BILE  Final   Special Requests Normal  Final   Gram Stain   Final    NO WBC SEEN FEW GRAM POSITIVE COCCI IN CLUSTERS Performed at [facility], [place] [address] [place], [place] [address]    Culture MODERATE STAPHYLOCOCCUS HAEMOLYTICUS  Final   Report Status 01/08/2017 FINAL  Final   Organism ID, Bacteria STAPHYLOCOCCUS HAEMOLYTICUS  Final      Susceptibility   Staphylococcus haemolyticus - MIC*    CIPROFLOXACIN >=8 RESISTANT Resistant     ERYTHROMYCIN >=8 RESISTANT Resistant     GENTAMICIN >=16 RESISTANT Resistant     OXACILLIN >=4 RESISTANT Resistant     TETRACYCLINE <=1 SENSITIVE Sensitive     VANCOMYCIN 1 SENSITIVE Sensitive     TRIMETH/SULFA >=320 RESISTANT Resistant     CLINDAMYCIN >=8 RESISTANT Resistant     RIFAMPIN <=0.5 SENSITIVE Sensitive     Inducible Clindamycin NEGATIVE Sensitive     * MODERATE STAPHYLOCOCCUS HAEMOLYTICUS           Radiology Studies: No results found.      Scheduled Meds: . enoxaparin (LOVENOX) injection  40 mg Subcutaneous Q24H  . famotidine  20 mg Oral BID  . methocarbamol  500 mg Oral QID  . metoCLOPramide  10 mg Oral TID AC  . nadolol  80 mg Oral Daily  . oxyCODONE  30 mg Oral Q12H  . pantoprazole  40 mg Oral Q12H  . polyethylene glycol  17 g Oral Daily  . potassium chloride SA  20 mEq Oral Daily  . sodium chloride flush  5 mL Intravenous Q8H  . vitamin B-12  2,500 mcg Oral Daily   Continuous Infusions: . vancomycin Stopped (01/11/17 1229)     LOS: 6 days        Aline August, MD [facility] Pager [phone]  If 7PM-7AM, please contact night-coverage [email or web address] Password [email or web address] 01/11/2017, 12:40 PM

## 2017-01-11 NOTE — Progress Notes (Signed)
IP PROGRESS NOTE  Subjective:   Bryan Fields was admitted 01/04/2017 after removal of a bile duct stent and placement of a percutaneous biliary drain. He developed riders after the procedure and was admitted for further evaluation. He has remained hospitalized with right upper abdominal pain. He had a fever and a culture from the biliary drainage returned positive for staphylococcus hemolyticus. He is being treated with vancomycin.  He continues to have pain in the right abdomen. The abdomen is distended. He has been started on OxyContin and is taking Dilaudid for breakthrough pain.  Objective: Vital signs in last 24 hours: Blood pressure 132/66, pulse 80, temperature 99.4 F (37.4 C), temperature source Oral, resp. rate 16, height '5\' 11"'$  (1.803 m), weight 215 lb (97.5 kg), SpO2 100 %.  Intake/Output from previous day: 08/05 0701 - 08/06 0700 In: 845 [P.O.:600; IV Piggyback:200] Out: 520 [Drains:520]  Physical Exam:  HEENT: No thrush Lungs: Decreased breath sounds with rhonchi at the posterior basis, no respiratory distress Cardiac: Regular rate and rhythm Abdomen: Right upper quadrant drain site with a gauze dressing. The abdomen is distended. Upper transverse incision has healed Extremities: No leg edema   Portacath/PICC-without erythema  Lab Results:  Recent Labs  01/09/17 0500 01/11/17 0500  WBC 4.2 6.8  HGB 10.3* 10.7*  HCT 31.0* 31.7*  PLT 130* 196    BMET  Recent Labs  01/09/17 0500 01/10/17 0417 01/11/17 0500  NA 136  --  137  K 3.8  --  3.6  CL 98*  --  102  CO2 28  --  28  GLUCOSE 89  --  108*  BUN 10  --  8  CREATININE 1.00 0.97 1.05  CALCIUM 8.8*  --  8.9    Studies/Results: No results found.  Medications: I have reviewed the patient's current medications.  Assessment/Plan: 1. 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 Microsatellite  stable; preserved expression of major and minor MMR proteins. He began treatment with FOLFOX in December 2011. He completed cycle #12 on 10/27/2010. Oxaliplatin was held with cycles number 7 through 10 and resumed with cycle #11. 1. History of neutropenia secondary to chemotherapy. 2. History of thrombocytopenia secondary chemotherapy. 3. He did not undergo a preoperative colonoscopy. He underwent a colonoscopy on 12/19/2010 with findings of a 1 cm polyp at 90 cm from the anal verge, minimal polyp at 30 cm from the anal verge and minimal diverticulosis. Colonoscopy 03/18/2012-negative. 4. 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 He began study drug on 05/13/2010. 5. Status post Port-A-Cath placement. The Port-A-Cath has been removed. 6. 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 8. Low attenuation lesion in the caudate lobe of the liver on the CT 04/11/2012-? Cyst. MRI liver on 04/20/2012 favored the caudate lobe liver lesion to represent a cyst or complex cyst. CT abdomen/pelvis 04/17/2013 with continued enlargement of hypovascular lesion in the caudate lobe of the liver.  PET scan 10/27/2011 confirmed a hypermetabolic caudate lobe liver lesion, tiny indeterminate lung nodules, and a 9 mm level II left neck node   CT biopsy of the liver lesion 11/07/2013 confirmed adenocarcinoma  Left liver and caudate resection 01/15/2014-pathology confirmed metastatic colon cancer, and negative surgical margins  Surveillance CT scans 07/24/2014 consistent with recurrent disease involving upper abdominal lymph nodes,  peritoneal implants, and an anterior abdominal wall mass  Status post biopsy of the anterior abdominal wall mass 08/06/2014 with pathology showing metastatic adenocarcinoma consistent with a colon primary.  [id number]  1/28  Cycle 1 FOLFIRI/Avastin on the [facility] 1317 08/20/2014  Cycle 2 FOLFIRI/Avastin 09/03/2014  Cycle 3 held on 09/17/2014 due to neutropenia  Cycle 3 FOLFIRI/Avastin 09/25/2014. Neulasta added beginning with cycle 3.  Cycle 4 FOLFIRI/Avastin 10/08/2014  Restaging CT scans 10/18/2014 with slight interval increase in the size of the soft tissue mass in the subcutaneous fat of the epigastric region. Underlying peritoneal implants, probable focus of residual disease along the resected margin of the liver and hepatoduodenal ligament lymphadenopathy all appeared slightly improved. No new sites of metastatic disease otherwise noted. New left lower lobe bronchopneumonia.  Cycle 5 FOLFIRI/Avastin 10/22/2014  Cycle 6 FOLFIRI/Avastin 11/06/2014  Cycle 7 FOLFIRI Avastin 11/19/2014  Cycle 8 FOLFIRI/Avastin 12/03/2014  CT 12/14/2014 with stable disease  Cycle 9 FOLFIRI/Avastin 12/17/2014  Cycle 10 FOLFIRI/Avastin 01/07/2015 (5-fluorouracil and leucovorin dose reduced)  Cycle 11 FOLFIRI/Avastin 01/21/2015  Cycle 12 FOLFIRI/Avastin 02/04/2015  CT 02/13/2015 with stable disease  Cycle 13 FOLFIRI/Avastin 02/25/2015-changed to an every three-week protocol, now off study  Avastin held with cycle 14 FOLFIRI on 03/25/2015 secondary to gingivitis  Restaging CTs 05/20/2015-slight increase in the size of the epigastric subcutaneous mass, stable peritoneal implants  FOLFIRI continued on a 3 week schedule  Avastin resumed 06/17/2015  CTs 12/23/2015-stable subxiphoid mass, stable low-attenuation mass adjacent to the left liver surgical margin, peritoneal lesions-stable  FOLFIRI continued on a 4 week schedule.  Avastin placed on hold 01/06/2016  FOLFIRI discontinued 03/02/2016 secondary to enlargement of the abdominal wall mass with new superficial fungating nodules involving the skin  Palliative radiation to the abdominal wall mass completed 03/20/2016  Cycle 1 FOLFOX12/27/2017  Cycle 2  CAPOX 06/22/2016  Clinical progression 07/07/2016 and allergic reaction to oxaliplatin 06/23/2015, CAPOX discontinued  Resection of the main abdominal wall mass and an adjacent peritoneal mass on 07/23/2016 with the pathology confirming metastatic colon cancer  [facility] CT[facility] at [facility] 09/18/2016-new liver metastasis, progressive soft tissue masses at the liver resection margin, new mass adjacent to the chest wall resection site, increase in size of peritoneal nodules  CT abdomen/pelvis 10/02/2016-interval progression of periportal adenopathy and recurrence along the hepatic resection margin. New hepatic program lesion superior right hepatic lobe. Interval increase in size of peritoneal implants left upper quadrant. Enlargement of anabdominal wall mass along the right rectus muscle.  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 This stricture was stented with a 6cm long, uncovered 57m diameter metal bile duct stent in good position.   Initiation of Lonsurf cycle 1 11/17/2016  MRI abdomen 12/24/2016-mild to moderate intrahepatic biliary ductal dilatation; stent in the common bile duct appears grossly patent; 3 intrahepatic metastases, extensive lymphadenopathy hepatoduodenal ligament and gastrohepatic ligament, multiple peritoneal implants in the upper abdomen and soft tissue metastases in the upper anterior abdominal wall musculature and overlying subcutaneous fat on the right side. 10. Iron deposition within the liver noted on MRI 04/20/2012. The ferritin level was normal on 04/17/2013. Increased iron deposition noted on the left liver resection 01/15/2014 11. Pain/bleeding at the anus reported when here 04/24/2013. Status post evaluation by Dr. NLucia Gaskinswith findings of an anal fissure. 12. Right face cystic lesion 13. Status post Port-A-Cath placement 08/14/2014 14. Delayed nausea following FOLFIRI, prophylactic  Decadron added with cycle 2 with improvement. 15. Chest CT  10/18/2014 with new left lower lobe bronchopneumonia. Levaquin initiated. Resolved on CT 12/14/2014 16. Gum pain-mucositis?, Gingivitis?-Improved with discontinuation of Avastin 17. Pain/tenderness, mild erythema and full appearance over the maxillary sinuses. Maxillofacial CT 01/30/2015 with mild to moderate bilateral maxillary and sphenoid sinus inflammatory changes. Multifocal right maxillary dental periapical lucency. Status post a right upper tooth extraction with resolution of the pain. 18. Admission 10/21/2016 with nausea/vomiting-x-ray evidence for a partial small bowel obstruction, clinical improvement with bowel rest, recurrent obstructive symptoms during the week of 11/30/2016 19. Admission 12/08/2016 with a high fever-Klebsiella bacteremia confirmed, likely a biliary source  20. Mild neutropenia and anemia/thrombocytopenia secondary to chemotherapy-the platelet count and white count have recovered during this hospital admission  21. Biliary obstruction-CT 12/14/2016 revealed intrahepatic biliary dilatation despite a common bile duct stent distended gallbladder  Nuclear medicine biliary scan 12/16/2016-poor hepatic uptake with nonvisualization of the bile ducts consistent with biliary obstruction  ERCP 12/16/2016 confirmed intrahepatic biliary dilatation, status post placement of a new bile duct stent  MRI abdomen 12/24/2016-mild to moderate intrahepatic biliary ductal dilatation; stent in the common bile duct appears grossly patent; 3 intrahepatic metastases, extensive lymphadenopathy hepatoduodenal ligament and gastrohepatic ligament, multiple peritoneal implants in the upper abdomen and soft tissue metastases in the upper anterior abdominal  wall musculature and overlying subcutaneous fat on the right side.  ERCP 01/04/2017-plastic biliary stent removed, metal stent left in place, portal hypertensive changes noted  Percutaneous transhepatic cholangiogram 01/04/2017 confirmed complete occlusion of the metal stent with proximal dilation of bile ducts, status post placement of an internal/external biliary drain  22.  Fever following the 01/04/2017 ERCP procedure-biliary drainage positive for Staphylococcus hemolyticus  23.  Pain secondary to metastatic colon cancer   Bryan Fields remains hospitalized following placement of an internal/external biliary drain. He was diagnosed with a biliary infection and is being treated with anti-biotics. The fever has resolved. He will be transitioned to doxycycline at discharge.  The hyperbilirubinemia has improved following placement of the external drain. Interventional radiology plans an outpatient drain exchange on 01/15/2017.  Bryan Fields has significant pain secondary metastatic colon cancer and he now appears to have ascites. He may benefit from a palliative paracentesis.  Recommendations: 1. Continue OxyContin 2. Dilaudid every 4 hours as needed for breakthrough pain. 3. Refer for palliative therapeutic paracentesis, send fluid for cytology 4. Outpatient follow-up at the [facility] will be arranged for later this week     LOS: 6 days   Donneta Romberg, MD   01/11/2017, 12:55 PM

## 2017-01-11 NOTE — Progress Notes (Signed)
Pharmacy Antibiotic Note  Bryan Fields is a 56 y.o. male with metastatic cancer and  biliary obstruction on lonsurf PTA presented to [facility] on 01/04/17 for ERCP.  Zosyn was started on admission for empiric coverage for intra-abdominal infection.  Bile fluid cx on 01/05/17 with moderate staph haemolyticus.  Vancomycin started on 01/09/17 and zosyn discontinued at that time.  01/11/2017 Scr 1.05, CrCl ~ 78 mls/min (N) WBC 6.8 afebrile   Plan: Obtain vanc trough tonight and adjust dose accordingly - currently vancomycin 1000 mg IV q8h - daily serum creatinine  ____________________________  Height: 5\' 11"  (180.3 cm) Weight: 215 lb (97.5 kg) IBW/kg (Calculated) : 75.3  Temp (24hrs), Avg:98.8 F (37.1 C), Min:98.4 F (36.9 C), Max:99.4 F (37.4 C)   Recent Labs Lab 01/05/17 0641 01/06/17 0520 01/07/17 0624 01/09/17 0500 01/10/17 0417 01/11/17 0500  WBC 6.4 8.9 5.1 4.2  --  6.8  CREATININE 1.15 1.08 1.04 1.00 0.97 1.05    Estimated Creatinine Clearance: 91.3 mL/min (by C-G formula based on SCr of 1.05 mg/dL).    Allergies  Allergen Reactions  . Other     Chemo therapy drug: Oxali Caused itching and redness.     Antimicrobials this admission:  7/30 zosyn>> 8/4 8/4 vanc>>  Microbiology results:  7/3 bcx x2: 1/2 klebsiella (R= amp) FINAL 7/5 bcx x2: neg FINAL  7/30 BCx x2: ngtd 7/31 bile fluid: Mod Staph haemolyticus (S= rifam, tetra, vanc)  Thank you for allowing pharmacy to be a part of this patient's care.  Dolly Rias RPh 01/11/2017, 1:23 PM Pager [phone]

## 2017-01-11 NOTE — Progress Notes (Signed)
Dr. Hal Hope returned call to nurse. Dr. Hal Hope advised nurse to make pharmacy aware of vancomycin trough 30. Nurse called pharmacy, pharmacist will discontinue order for vancomycin dose at 1930 and 0330. Pharmacy will request labs to be redrawn tomorrow morning.

## 2017-01-11 NOTE — Progress Notes (Signed)
Referring Physician(s):  Dr. Owens Loffler  Supervising Physician: Sandi Mariscal  Patient Status:  [facility] - In-pt  Chief Complaint:  Biliary obstruction  Subjective: Patient seen in [place].  Also for evaluation of possible ascites and paracentesis.  Ready to go home.  Aware of plans to return to IR as outpatient Friday.   Allergies: Other  Medications: Prior to Admission medications   Medication Sig Start Date End Date Taking? Authorizing Provider  acetaminophen (TYLENOL) 325 MG tablet Take 650 mg by mouth every 6 (six) hours as needed for moderate pain or fever.   Yes [provider]  atenolol (TENORMIN) 50 MG tablet Take 1 tablet (50 mg total) by mouth daily. 04/17/16  Yes Ria Bush, MD  Cyanocobalamin (VITAMIN B-12) 2500 MCG SUBL Place 1 tablet under the tongue daily.   Yes [provider]  diazepam (VALIUM) 5 MG tablet Take 5 mg by mouth 3 (three) times daily as needed for muscle spasms.  11/29/16  Yes [provider]  HYDROmorphone (DILAUDID) 2 MG tablet Take 1-2 tablets (2-4 mg total) by mouth every 4 (four) hours as needed for severe pain. Patient taking differently: Take 2 mg by mouth every 2 (two) hours as needed for severe pain.  12/07/16  Yes Ladell Pier, MD  ibuprofen (ADVIL,MOTRIN) 200 MG tablet Take 600 mg by mouth every 6 (six) hours as needed for mild pain.    Yes [provider]  KLOR-CON M20 20 MEQ tablet Take 20 mEq by mouth daily. 11/26/16  Yes [provider]  lidocaine-prilocaine (EMLA) cream Apply 1 application topically as needed (apply to port).   Yes [provider]  metoCLOPramide (REGLAN) 10 MG tablet Take 1 tablet (10 mg total) by mouth 3 (three) times daily before meals. 12/25/16 01/24/17 Yes Owens Shark, NP  omeprazole (PRILOSEC) 40 MG capsule Take 1 capsule (40 mg total) by mouth every other day. Patient taking differently: Take 40 mg by mouth daily.  04/17/16  Yes Ria Bush, MD    ondansetron (ZOFRAN) 8 MG tablet Take 1 tablet (8 mg total) by mouth every 8 (eight) hours as needed for nausea or vomiting. 11/27/16  Yes Ladell Pier, MD  polyethylene glycol (MIRALAX / GLYCOLAX) packet Take 17 g by mouth daily.   Yes [provider]  ranitidine (ZANTAC) 150 MG tablet Take 150 mg by mouth daily.   Yes [provider]  sildenafil (VIAGRA) 100 MG tablet Take 100 mg by mouth daily as needed for erectile dysfunction. Reported on 06/17/2015   Yes [provider]  ciprofloxacin (CIPRO) 500 MG tablet Take 1 tablet (500 mg total) by mouth 2 (two) times daily. Patient not taking: Reported on 12/30/2016 12/21/16   Milus Banister, MD  prochlorperazine (COMPAZINE) 10 MG tablet Take 1 tablet (10 mg total) by mouth every 6 (six) hours as needed for nausea or vomiting. Patient not taking: Reported on 12/25/2016 11/27/16   Ladell Pier, MD  trifluridine-tipiracil (LONSURF) 20-8.19 MG tablet Take 4 tablets (80 mg of trifluridine total) by mouth 2 (two) times daily after a meal. On days 1-5, 8-12. Repeat every 28days 11/27/16   Ladell Pier, MD     Vital Signs: BP 132/78 (BP Location: Left Arm)   Pulse 76   Temp 99.3 F (37.4 C) (Oral)   Resp 16   Ht 5\' 11"  (1.803 m)   Wt 215 lb (97.5 kg)   SpO2 97%   BMI 29.99 kg/m  Physical Exam  NAD, alert Abd:  Biliary drain in place with bilious output.  Tender at insertion site but intact, clean and dry.    Imaging: No results found.  Labs:  CBC:  Recent Labs  01/06/17 0520 01/07/17 0624 01/09/17 0500 01/11/17 0500  WBC 8.9 5.1 4.2 6.8  HGB 12.0* 11.3* 10.3* 10.7*  HCT 36.2* 33.8* 31.0* 31.7*  PLT 131* 145* 130* 196    COAGS:  Recent Labs  01/04/17 0930  INR 1.00    BMP:  Recent Labs  01/06/17 0520 01/07/17 0624 01/09/17 0500 01/10/17 0417 01/11/17 0500  NA 137 134* 136  --  137  K 3.7 4.1 3.8  --  3.6  CL 103 100* 98*  --  102  CO2 22 26 28   --  28  GLUCOSE 101* 103* 89   --  108*  BUN 17 18 10   --  8  CALCIUM 9.0 8.9 8.8*  --  8.9  CREATININE 1.08 1.04 1.00 0.97 1.05  GFRNONAA >60 >60 >60 >60 >60  GFRAA >60 >60 >60 >60 >60    LIVER FUNCTION TESTS:  Recent Labs  01/06/17 0520 01/07/17 0624 01/09/17 0500 01/11/17 0500  BILITOT 3.7* 2.7* 2.2* 2.0*  AST 148* 69* 33 37  ALT 163* 113* 62 53  ALKPHOS 286* 225* 156* 134*  PROT 7.6 7.6 7.1 7.2  ALBUMIN 3.2* 3.1* 2.9* 3.0*    Assessment and Plan: Metastatic colon cancer, biliary obstruction s/p internal/external biliary drain placement 01/05/17 by Dr. Earleen Newport.  Patient seen in [place] for possible ascites.  Paracentesis has been requested.  No ascites found on [place]; results of Limited Abd [place] dictated separately.  No paracentesis performed. Patient drain is stable.  Still having considerable output.   Bilirubin is slowly improving.  Site is tender, but clean, dry and intact.  Patient is to return to IR on Friday for drain exchange.  Discussed plans with patient.  Notified Dr. Starla Link of status.   Electronically Signed: Docia Barrier, PA 01/11/2017, 4:08 PM   I spent a total of 15 Minutes at the the patient's bedside AND on the patient's hospital floor or unit, greater than 50% of which was counseling/coordinating care for metastatic colon cancer, biliary obstruction

## 2017-01-12 ENCOUNTER — Ambulatory Visit: Payer: 59 | Admitting: Nurse Practitioner

## 2017-01-12 ENCOUNTER — Other Ambulatory Visit: Payer: 59

## 2017-01-12 ENCOUNTER — Other Ambulatory Visit: Payer: Self-pay | Admitting: Nurse Practitioner

## 2017-01-12 DIAGNOSIS — C189 Malignant neoplasm of colon, unspecified: Secondary | ICD-10-CM

## 2017-01-12 LAB — COMPREHENSIVE METABOLIC PANEL
ALT: 45 U/L (ref 17–63)
ANION GAP: 10 (ref 5–15)
AST: 33 U/L (ref 15–41)
Albumin: 2.9 g/dL — ABNORMAL LOW (ref 3.5–5.0)
Alkaline Phosphatase: 125 U/L (ref 38–126)
BILIRUBIN TOTAL: 2.1 mg/dL — AB (ref 0.3–1.2)
BUN: 11 mg/dL (ref 6–20)
CHLORIDE: 100 mmol/L — AB (ref 101–111)
CO2: 26 mmol/L (ref 22–32)
Calcium: 8.8 mg/dL — ABNORMAL LOW (ref 8.9–10.3)
Creatinine, Ser: 1.21 mg/dL (ref 0.61–1.24)
Glucose, Bld: 98 mg/dL (ref 65–99)
POTASSIUM: 3.8 mmol/L (ref 3.5–5.1)
Sodium: 136 mmol/L (ref 135–145)
TOTAL PROTEIN: 7.1 g/dL (ref 6.5–8.1)

## 2017-01-12 LAB — CBC WITH DIFFERENTIAL/PLATELET
BASOS ABS: 0 10*3/uL (ref 0.0–0.1)
Basophils Relative: 0 %
EOS PCT: 3 %
Eosinophils Absolute: 0.2 10*3/uL (ref 0.0–0.7)
HCT: 31.5 % — ABNORMAL LOW (ref 39.0–52.0)
Hemoglobin: 10.7 g/dL — ABNORMAL LOW (ref 13.0–17.0)
LYMPHS PCT: 27 %
Lymphs Abs: 1.6 10*3/uL (ref 0.7–4.0)
MCH: 33.2 pg (ref 26.0–34.0)
MCHC: 34 g/dL (ref 30.0–36.0)
MCV: 97.8 fL (ref 78.0–100.0)
MONO ABS: 0.8 10*3/uL (ref 0.1–1.0)
MONOS PCT: 13 %
Neutro Abs: 3.3 10*3/uL (ref 1.7–7.7)
Neutrophils Relative %: 57 %
PLATELETS: 196 10*3/uL (ref 150–400)
RBC: 3.22 MIL/uL — ABNORMAL LOW (ref 4.22–5.81)
RDW: 16.9 % — AB (ref 11.5–15.5)
WBC: 5.8 10*3/uL (ref 4.0–10.5)

## 2017-01-12 LAB — VANCOMYCIN, RANDOM: VANCOMYCIN RM: 20

## 2017-01-12 LAB — MAGNESIUM: MAGNESIUM: 1.9 mg/dL (ref 1.7–2.4)

## 2017-01-12 MED ORDER — DOXYCYCLINE HYCLATE 100 MG PO TABS: 100.0000 mg | ORAL_TABLET | Freq: Two times a day (BID) | ORAL | 0 refills | Status: DC

## 2017-01-12 MED ORDER — BISACODYL 5 MG PO TBEC: 5.0000 mg | DELAYED_RELEASE_TABLET | Freq: Every day | ORAL | 0 refills | Status: DC | PRN

## 2017-01-12 MED ORDER — DOXYCYCLINE HYCLATE 100 MG PO TABS
100.0000 mg | ORAL_TABLET | Freq: Two times a day (BID) | ORAL | Status: DC
  Administered 2017-01-12: 100 mg via ORAL
  Filled 2017-01-12: qty 1

## 2017-01-12 MED ORDER — NADOLOL 80 MG PO TABS: 80.0000 mg | ORAL_TABLET | Freq: Every day | ORAL | 0 refills | Status: DC

## 2017-01-12 MED ORDER — PANTOPRAZOLE SODIUM 40 MG PO TBEC: 40.0000 mg | DELAYED_RELEASE_TABLET | Freq: Two times a day (BID) | ORAL | 0 refills | Status: DC

## 2017-01-12 MED ORDER — FUROSEMIDE 40 MG PO TABS: 40.0000 mg | ORAL_TABLET | Freq: Every day | ORAL | 0 refills | Status: DC

## 2017-01-12 MED ORDER — HYDROMORPHONE HCL 2 MG PO TABS: 4.0000 mg | ORAL_TABLET | ORAL | 0 refills | Status: DC | PRN

## 2017-01-12 MED ORDER — VANCOMYCIN HCL IN DEXTROSE 1-5 GM/200ML-% IV SOLN
1000.0000 mg | Freq: Two times a day (BID) | INTRAVENOUS | Status: DC
  Administered 2017-01-12: 1000 mg via INTRAVENOUS

## 2017-01-12 MED ORDER — SENNOSIDES-DOCUSATE SODIUM 8.6-50 MG PO TABS: 2.0000 | ORAL_TABLET | Freq: Every day | ORAL | 0 refills | Status: DC

## 2017-01-12 MED ORDER — HEPARIN SOD (PORK) LOCK FLUSH 100 UNIT/ML IV SOLN
500.0000 [IU] | INTRAVENOUS | Status: AC | PRN
  Administered 2017-01-12: 500 [IU]
  Filled 2017-01-12: qty 5

## 2017-01-12 MED ORDER — OXYCODONE HCL ER 30 MG PO T12A: 30.0000 mg | EXTENDED_RELEASE_TABLET | Freq: Two times a day (BID) | ORAL | 0 refills | Status: DC

## 2017-01-12 NOTE — Progress Notes (Signed)
Nursing Discharge Summary  Patient ID: ZYAD BOOMER MRN: [id number] DOB/AGE: 1960-12-02 56 y.o.  Admit date: 01/04/2017 Discharge date: 01/12/2017  Discharged Condition: good  Disposition: 01-Home or [place]    Ria Bush, MD Follow up in 1 week(s).   Specialty:  Family Medicine Contact information: [place] [address] [phone]        Ladell Pier, MD Follow up.   Specialty:  Oncology Why:  at earliest convenience  Contact information: [place] [address] [phone]        Sandi Mariscal, MD Follow up.   Specialty:  Interventional Radiology Why:  as scheduled Contact information: [place] [address] [phone]        Milus Banister, MD Follow up in 1 week(s).   Specialty:  Gastroenterology Why:  with repeat CMP Contact information: [address] [place] [address] [phone]           Prescriptions Given: Prescriptions for Oxycodone and Dilaudid given to wife, other prescriptions called into the pharmacy.    Means of Discharge: Patient will be taken downstairs via wheelchair to be discharged home.   Signed: Buel Ream 01/12/2017, 12:26 PM

## 2017-01-12 NOTE — Progress Notes (Signed)
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  Vancomycin random level = 20 mg/L (~17 hours after last dose)  - scr rising slightly = 1.21  Plan: - Will give Vancomycin 1 Gm x1 now - f/u additional doses and Scr  Dorrene German 01/12/2017 6:30 AM

## 2017-01-12 NOTE — Discharge Summary (Signed)
Physician Discharge Summary  Bryan Fields BMW:[id number] DOB: 1961/05/28 DOA: 01/04/2017  PCP: Ria Bush, MD  Admit date: 01/04/2017 Discharge date: 01/12/2017  Admitted From: Home  Disposition:  Home  Recommendations for Outpatient Follow-up:  1. Follow up with PCP in 1week 2. Follow-up with Dr. Sherill/oncology at earliest convenience 3. Follow-up with interventional radiology as scheduled for management of the biliary drain 4. Follow-up with Dr. Ardis Hughs Daniel/GI in 1-2 weeks with repeat CMP 5. 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 Patient had ERCP and removal of plastic stent (metal stent still in place) by GI and external/internal biliary drain placed by IR on 01/04/2017.GI &IR arefollowing the patient. Bile culture is growing staph hemolyticus. Patient states that his pain is improving  Discharge Diagnoses:  Principal Problem:   Disorder of bile duct stent Active Problems:   Primary colon cancer with metastasis to other site [facility])   Essential hypertension   Metastatic colon cancer to liver [facility])   Malignant neoplasm of liver (HCC)   Biliary obstruction  Biliary obstruction due to metastatic colon cancer to liver Status post ERCP with no significant improvement, ERCP repeated on 01/05/1999 by GI with plastic stent removal (metallic stent still in place). S/P external/internal biliary drain placed by IR,and now bilirubin is slowly trending down. Outpatient follow-up with IR regarding management of the drain. Outpatient follow-up with GI with repeat LFTs  in 1-2 weeks Bile culture is growing staph hemolyticus. Zosyn discontinued. Currently on vancomycin. Switch to oral doxycycline for 10 more days. Continue OxyContin 30 mg twice a day. Continue Dilaudid 4 mg every 4 hours when necessary.   he'll benefit from outpatient follow-up with pain management. Continue bowel regimen. Follow-up with oncology at earliest convenience  Sepsis: Improved.Blood cultures negative. Bile culture as noted above  Primary colon cancer with metastasis Follow-up with Dr. Learta Codding asan outpatient; continue to hold chemotherapy. - No ascites on ultrasound. Continue Lasix 40 mg daily for now - Follow up with oncology at earliest convenience for treatment as well as pain management  Essential hypertension: Continue Nadalol.   Discharge Instructions  Discharge Instructions    Call MD for:  difficulty breathing, headache or visual disturbances    Complete by:  As directed    Call MD for:  extreme fatigue    Complete by:  As directed    Call MD for:  persistant dizziness or light-headedness    Complete by:  As directed    Call MD for:  persistant nausea and vomiting    Complete by:  As directed    Call MD for:  severe uncontrolled pain    Complete by:  As directed    Call MD for:  temperature >100.4    Complete by:  As directed    Diet - low sodium heart healthy    Complete by:  As directed    Increase activity slowly    Complete by:  As directed      Allergies as of 01/12/2017      Reactions   Other    Chemo therapy drug: Oxali Caused itching and redness.       Medication List  STOP taking these medications   atenolol 50 MG tablet Commonly known as:  TENORMIN   ciprofloxacin 500 MG tablet Commonly known as:  CIPRO   omeprazole 40 MG capsule Commonly known as:  PRILOSEC Replaced by:  pantoprazole 40 MG tablet   prochlorperazine 10 MG tablet Commonly known as:  COMPAZINE   trifluridine-tipiracil 20-8.19 MG tablet Commonly known as:   LONSURF     TAKE these medications   acetaminophen 325 MG tablet Commonly known as:  TYLENOL Take 650 mg by mouth every 6 (six) hours as needed for moderate pain or fever.   bisacodyl 5 MG EC tablet Commonly known as:  DULCOLAX Take 1 tablet (5 mg total) by mouth daily as needed for moderate constipation.   diazepam 5 MG tablet Commonly known as:  VALIUM Take 5 mg by mouth 3 (three) times daily as needed for muscle spasms.   doxycycline 100 MG tablet Commonly known as:  VIBRA-TABS Take 1 tablet (100 mg total) by mouth every 12 (twelve) hours.   furosemide 40 MG tablet Commonly known as:  LASIX Take 1 tablet (40 mg total) by mouth daily.   HYDROmorphone 2 MG tablet Commonly known as:  DILAUDID Take 2 tablets (4 mg total) by mouth every 4 (four) hours as needed for severe pain. What changed:  how much to take   ibuprofen 200 MG tablet Commonly known as:  ADVIL,MOTRIN Take 600 mg by mouth every 6 (six) hours as needed for mild pain.   KLOR-CON M20 20 MEQ tablet Generic drug:  potassium chloride SA Take 20 mEq by mouth daily.   lidocaine-prilocaine cream Commonly known as:  EMLA Apply 1 application topically as needed (apply to port).   metoCLOPramide 10 MG tablet Commonly known as:  REGLAN Take 1 tablet (10 mg total) by mouth 3 (three) times daily before meals.   nadolol 80 MG tablet Commonly known as:  CORGARD Take 1 tablet (80 mg total) by mouth daily.   ondansetron 8 MG tablet Commonly known as:  ZOFRAN Take 1 tablet (8 mg total) by mouth every 8 (eight) hours as needed for nausea or vomiting.   oxyCODONE 30 MG 12 hr tablet Take 30 mg by mouth every 12 (twelve) hours.   pantoprazole 40 MG tablet Commonly known as:  PROTONIX Take 1 tablet (40 mg total) by mouth every 12 (twelve) hours. Replaces:  omeprazole 40 MG capsule   polyethylene glycol packet Commonly known as:  MIRALAX / GLYCOLAX Take 17 g by mouth daily.   ranitidine 150 MG tablet Commonly  known as:  ZANTAC Take 150 mg by mouth daily.   senna-docusate 8.6-50 MG tablet Commonly known as:  Senokot-S Take 2 tablets by mouth daily.   sildenafil 100 MG tablet Commonly known as:  VIAGRA Take 100 mg by mouth daily as needed for erectile dysfunction. Reported on 06/17/2015   Vitamin B-12 2500 MCG Subl Place 1 tablet under the tongue daily.     ASK your doctor about these medications   ciprofloxacin 500 MG tablet Commonly known as:  CIPRO Take 1 tablet (500 mg total) by mouth 2 (two) times daily. Ask about: Should I take this medication?       Allergies  Allergen Reactions  . Other     Chemo therapy drug: Oxali Caused itching and redness.    Follow-up Information    Ria Bush, MD Follow up in 1 week(s).   Specialty:  Family Medicine Contact information: [place]  Coral, Gary B, MD Follow up.   Specialty:  Oncology Why:  at earliest convenience  Contact information: [place] [address] [phone]        Sandi Mariscal, MD Follow up.   Specialty:  Interventional Radiology Why:  as scheduled Contact information: [address] [place] [address] [phone]        Milus Banister, MD Follow up in 1 week(s).   Specialty:  Gastroenterology Why:  with repeat CMP Contact information: [address] [place] [address] [phone]            Consultations:  GI, IR, oncology   Procedures/Studies: Nm Hepatobiliary Including Gb  Result Date: 12/16/2016 CLINICAL DATA:  Abdominal pain with nausea vomiting. Common bile duct stent with metastatic disease in the region of the porta hepatis. Biliary dilatation. EXAM: NUCLEAR MEDICINE HEPATOBILIARY IMAGING TECHNIQUE: Sequential images of the abdomen were obtained out to 60 minutes following intravenous administration of radiopharmaceutical. RADIOPHARMACEUTICALS:  9.6 mCi Tc-9m   Choletec IV COMPARISON:  CT scan 12/14/2016 FINDINGS: Delayed radiotracer uptake by liver parenchyma and delayed clearance of blood pool activity is compatible with underlying hepatocyte dysfunction. The was given IV morphine to facilitate gallbladder filling. After 2 hours of total observation, there is no visualization of bile ducts, gallbladder, or small bowel. IMPRESSION: Poor hepatic uptake of radiotracer with delayed blood pool clearance is associated with nonvisualization the bile ducts. Imaging features are consistent with biliary obstruction and hepatocyte dysfunction. Electronically Signed   By: Misty Stanley M.D.   On: 12/16/2016 11:52   Ct Abdomen Pelvis W Contrast  Result Date: 12/14/2016 CLINICAL DATA:  Metastatic colon cancer. Sudden onset fever and chills with diarrhea 1 day ago. EXAM: CT ABDOMEN AND PELVIS WITH CONTRAST TECHNIQUE: Multidetector CT imaging of the abdomen and pelvis was performed using the standard protocol following bolus administration of intravenous contrast. CONTRAST:  83mL ISOVUE-300 IOPAMIDOL (ISOVUE-300) INJECTION 61%, 169mL ISOVUE-300 IOPAMIDOL (ISOVUE-300) INJECTION 61% COMPARISON:  Ultrasound exam 12/08/2016.  CT scan 10/02/2016 FINDINGS: Lower chest: Bilateral gynecomastia, incompletely visualized. Coronary artery calcification is noted. Hepatobiliary: Left hepatectomy. 2.9 x 3.7 cm mass along the resection margin of the liver is similar to prior. 16 mm lesion seen in the dome of the liver on prior study measures 2.2 cm today. There is a new 1.9 cm ill-defined low-density liver lesion seen in the right liver on image 17 series 2. A common duct stent is new in the interval and crosses a irregular and ill-defined soft tissue mass in the porta hepatis measuring 6.9 x 3.9 cm. The stent tracks from about the biliary confluence in the liver to the pancreas, but does not extend down the common bile duct into the duodenal lumen. There is mild intrahepatic biliary duct dilatation  proximal to the stent, progressed in the interval since prior study. Gallbladder is distended with layering tiny gallstones. Gallbladder wall is ill-defined. There is a small amount of fluid in the hepatoduodenal ligament adjacent to the neck of the gallbladder and second portion of the duodenum. Pancreas: Diffusely atrophic. Spleen: No splenomegaly. No focal mass lesion. Adrenals/Urinary Tract: No adrenal nodule or mass. Kidneys are unremarkable. No evidence for hydroureter. The urinary bladder appears normal for the degree of distention. Stomach/Bowel: Stomach is decompressed. Duodenum is normally positioned as is the ligament of Treitz. No small bowel wall thickening. No small bowel dilatation. Right hemicolectomy. Vascular/Lymphatic: There is abdominal aortic atherosclerosis  without aneurysm. Lymphadenopathy in the porta hepatis is similar. No pelvic sidewall lymphadenopathy. Reproductive: The prostate gland and seminal vesicles have normal imaging features. Other: Left omental lesion measured at 3.0 cm previously is now 3.5 cm. 2.0 x 3.3 cm lesion in the right rectus sheath was 2.1 x 2.7 cm previously. There is trace fluid in the inferior right liver which tracks down the right para colic gutter. Musculoskeletal: IMPRESSION: 1. Since the prior CT study, there has been a common duct stent placed with intrahepatic biliary duct dilatation on today's CT scan similar to a potentially minimally increased since the ultrasound exam 5 days ago. There is a distended gallbladder with small volume pericholecystic fluid and a small amount of fluid in the porta hepatis, down along the right liver and tracking in the right para colic gutter. Acute cholecystitis is a consideration. Given the biliary dilatation, cholangitis cannot be excluded by CT imaging. 2. Interval progression of pre-existing liver lesion within new metastatic lesion identified in the right liver today measuring 1.9 cm. 3. Persistent recurrent neoplasm  along the resection margin of the liver and in the porta hepatis, not substantially changed in the interval since prior CT. Additional sites of metastatic disease are seen in the anterior abdominal wall and left omentum, also similar to prior. Electronically Signed   By: Misty Stanley M.D.   On: 12/14/2016 19:19   Mr 3d Recon At Scanner  Result Date: 12/25/2016 CLINICAL DATA:  56 year old male with signs and symptoms of biliary tract obstruction. Metastatic colon cancer. EXAM: MRI ABDOMEN WITHOUT AND WITH CONTRAST (INCLUDING MRCP) TECHNIQUE: Multiplanar multisequence MR imaging of the abdomen was performed both before and after the administration of intravenous contrast. Heavily T2-weighted images of the biliary and pancreatic ducts were obtained, and three-dimensional MRCP images were rendered by post processing. CONTRAST:  45mL MULTIHANCE GADOBENATE DIMEGLUMINE 529 MG/ML IV SOLN COMPARISON:  MRI of the abdomen 11/20/2013. CT the abdomen and pelvis 12/14/2016. FINDINGS: Lower chest: Visualized portions are unremarkable. Hepatobiliary: 3 hypovascular hepatic lesions are noted: 2.3 cm lesion in segment 8 (axial image 14 of series 1401), 1.9 cm lesion in segment 7 (axial image 31 of series 1401), and 8 mm lesion in segment 6 (axial image 74 of series 1401). MRCP images demonstrate mild to moderate intrahepatic biliary ductal dilatation. There is a stent in the common bile duct which limits evaluation of this portion of a biliary tree. The stent does appear grossly patent. Distal common bile duct beyond the stent is patent measuring 4 mm. No filling defect in this portion of the common bile duct to suggest choledocholithiasis at this time. Small filling defects are noted within the gallbladder, compatible with cholelithiasis. Gallbladder is moderately distended, but there is no gallbladder wall thickening. Trace amount of pericholecystic fluid and fluid adjacent to the second and third portions of the duodenum.  Pancreas: No pancreatic mass. No pancreatic ductal dilatation on MRCP images. Trace amount of fluid adjacent to the inferior aspect of the pancreatic head and adjacent duodenum. Spleen: Spleen is mildly enlarged measuring 13.4 x 7.7 x 15.3 cm (estimated splenic volume of 789 mL). Adrenals/Urinary Tract: Bilateral kidneys and bilateral adrenal glands are normal in appearance. No hydroureteronephrosis in the visualized portions of the abdomen. Stomach/Bowel: Small amount of fluid adjacent to the second and third portions of the duodenum. Otherwise, visualized portions of stomach, small bowel and colon are unremarkable. Vascular/Lymphatic: No aneurysm identified in the visualized abdominal vasculature. Soft tissue mass is in the region of the porta  hepatis likely reflects lymphadenopathy. The largest of these measures 5.2 x 3.5 x 4.4 cm (axial image 44 of series 1405n coronal image 67 of series 15) medial to the proximal common bile duct in the hepatoduodenal ligament. An additional lesion posterior to the common bile duct in the portacaval space measures 2.5 x 4.5 x 6.4 cm (axial image 54 of series 1405 and coronal image 57 of series 15). An additional lesion adjacent to the left lobe of the liver may represent gastrohepatic ligament lymphadenopathy or a serosal implant upon the surface of the liver measuring 4.1 x 3.6 x 4.5 cm (axial image 30 of series 1405 and coronal image 77 of series 15). Other: Multiple peritoneal implants are noted in the upper abdomen, the largest of which include a 3.6 x 3.0 x 2.8 cm lesion in the left upper quadrant which is intimately associated with some loops of mid small bowel (axial image 54 of series 1403 and coronal image 93 of series 15), and a lesion that is slightly more cephalad in location intimately associated with the undersurface of the left hemidiaphragm (axial image 35 of series 14 of 3 and coronal image 89 of series 15) measuring 2.8 x 2.2 x 2.1 cm. Trace volume of fluid  adjacent to the second and third portions of the duodenum. No larger volume of ascites. Musculoskeletal: Multiple heterogeneously enhancing lesions are again noted in the right side of the anterior abdominal wall musculature, the largest of which measures 2.7 x 4.3 x 4.5 cm (axial image 43 of series 1403 and coronal image 111 of series 15). No aggressive appearing osseous lesions are noted in the visualized portions of the skeleton. IMPRESSION: 1. MRCP images are limited by the presence of the common bile duct stent. However, the stent appears grossly patent. There is mild to moderate intrahepatic biliary ductal dilatation. No definite choledocholithiasis within the well evaluated portions of the common bile duct. There is cholelithiasis. Gallbladder is moderately distended, and there is a trace amount of pericholecystic fluid as well as fluid adjacent to the second and third portions of the duodenum. However, there is no gallbladder wall thickening or other overt findings to strongly suggest an acute cholecystitis at this time. 2. Widespread metastatic disease noted throughout the visualized abdomen, including 3 intrahepatic metastases, extensive lymphadenopathy in the region of the hepatoduodenal ligament and gastrohepatic ligament, multiple peritoneal implants in the upper abdomen, and soft tissue metastases in the upper anterior abdominal wall musculature and overlying subcutaneous fat on the right side, as described above. Electronically Signed   By: Vinnie Langton M.D.   On: 12/25/2016 08:24   US Abdomen Limited  Result Date: 01/11/2017 CLINICAL DATA:  History of metastatic colon cancer, now with abdominal pain. Please evaluate for abdominal ascites and perform ultrasound-guided paracentesis as indicated. EXAM: LIMITED ABDOMEN ULTRASOUND FOR ASCITES TECHNIQUE: Limited ultrasound survey for ascites was performed in all four abdominal quadrants. COMPARISON:  MRCP - 12/24/2016 FINDINGS: Provided grayscale  images of the 4 quadrants of the abdomen are negative for any significant intra-abdominal fluid. IMPRESSION: No intra-abdominal ascites.  No paracentesis performed. Electronically Signed   By: Sandi Mariscal M.D.   On: 01/11/2017 16:43   Dg Chest Port 1 View  Result Date: 01/06/2017 CLINICAL DATA:  Right upper quadrant pain since ERCP performed yesterday. EXAM: PORTABLE CHEST 1 VIEW COMPARISON:  ERCP and biliary stent retrieval -01/04/2017; fluoroscopic guided percutaneous biliary drainage catheter placement -01/04/2017 ; abdominal radiograph - earlier same day; acute abdominal radiographic series - 12/08/2016  FINDINGS: Grossly unchanged cardiac silhouette and mediastinal contours given slightly reduced lung volumes. Stable position of support apparatus. Left basilar heterogeneous opacities are unchanged in favored to represent atelectasis or scar. No new focal airspace opacities. No pleural effusion or pneumothorax. No evidence of edema. No acute osseus abnormalities. Surgical clips overlying the midline of the upper abdomen. Percutaneous biliary drainage catheter overlies right upper abdominal quadrant as does internal biliary stent. IMPRESSION: Decreased lung volumes without superimposed acute cardiopulmonary disease. Electronically Signed   By: Sandi Mariscal M.D.   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 He has an internal metal stent which has become occluded. A plastic biliary stent was recently placed and will now be removed prior to percutaneous transhepatic cholangiogram and internal/external biliary drain placement. EXAM: ERCP TECHNIQUE: Multiple spot images obtained with the fluoroscopic device and submitted for interpretation post-procedure. FLUOROSCOPY TIME:  Please see operative note for detail. COMPARISON:  MRCP 12/24/2016 FINDINGS: Three intraoperative saved  images demonstrate a flexible endoscope in the descending duodenum. A plastic biliary stent is noted passing coaxially through a metallic stent. On the final image, the plastic stent is no longer present. IMPRESSION: Successful retrieval of plastic biliary stent. These images were submitted for radiologic interpretation only. Please see the procedural report for the amount of contrast and the fluoroscopy time utilized. Electronically Signed   By: Jacqulynn Cadet M.D.   On: 01/04/2017 11:31   Dg Ercp  Result Date: 12/16/2016 CLINICAL DATA:  Adenocarcinoma EXAM: ERCP TECHNIQUE: Multiple spot images obtained with the fluoroscopic device and submitted for interpretation post-procedure. FLUOROSCOPY TIME:  Fluoroscopy Time:  5 minutes and 25 seconds Radiation Exposure Index (if provided by the fluoroscopic device): 124.65 Number of Acquired Spot Images: 0 COMPARISON:  None. FINDINGS: There is a stent in the mid and upper common bile duct. Cannulation of the common bile duct is documented. Contrast fills the biliary tree. There is dilatation of the intrahepatic biliary tree. There is filling of the stented portion of the common bile duct preventing opacification of contrast. Final images demonstrate placement of a plastic stent extending from above the stent into the duodenum. IMPRESSION: Plastic stent placement across the common bile duct as described. These images were submitted for radiologic interpretation only. Please see the procedural report for the amount of contrast and the fluoroscopy time utilized. Electronically Signed   By: Marybelle Killings M.D.   On: 12/16/2016 14:53   Dg Abd Portable 1v  Result Date: 01/06/2017 CLINICAL DATA:  Post internal biliary stent retrieval and placement of percutaneous biliary drainage catheter. EXAM: PORTABLE ABDOMEN - 1 VIEW COMPARISON:  Fluoroscopic guided biliary drainage catheter placement - 01/04/2017 ; ERCP - 01/04/2017 FINDINGS: Right hepatic approach percutaneous  biliary drainage catheter tip overlies expected location of the descending portion of the duodenum with the mid/distal aspect overlying the internal biliary stent. Appearance is unchanged compared to completion images performed yesterday. Paucity of bowel gas without evidence of enteric obstruction. Nondiagnostic evaluation for pneumoperitoneum secondary supine positioning and exclusion of the lower thorax. No pneumatosis or portal venous gas. No definitive abnormal intra-abdominal calcifications No acute osseus abnormalities. IMPRESSION: 1. Grossly unchanged positioning of trans hepatic percutaneous biliary drainage catheter overlying internal biliary stent. 2. Nonobstructive bowel gas pattern. Electronically Signed   By: Sandi Mariscal M.D.   On: 01/06/2017 14:15   Mr Abdomen Mrcp Moise Boring Contast  Result  Date: 12/25/2016 CLINICAL DATA:  56 year old male with signs and symptoms of biliary tract obstruction. Metastatic colon cancer. EXAM: MRI ABDOMEN WITHOUT AND WITH CONTRAST (INCLUDING MRCP) TECHNIQUE: Multiplanar multisequence MR imaging of the abdomen was performed both before and after the administration of intravenous contrast. Heavily T2-weighted images of the biliary and pancreatic ducts were obtained, and three-dimensional MRCP images were rendered by post processing. CONTRAST:  14mL MULTIHANCE GADOBENATE DIMEGLUMINE 529 MG/ML IV SOLN COMPARISON:  MRI of the abdomen 11/20/2013. CT the abdomen and pelvis 12/14/2016. FINDINGS: Lower chest: Visualized portions are unremarkable. Hepatobiliary: 3 hypovascular hepatic lesions are noted: 2.3 cm lesion in segment 8 (axial image 14 of series 1401), 1.9 cm lesion in segment 7 (axial image 31 of series 1401), and 8 mm lesion in segment 6 (axial image 74 of series 1401). MRCP images demonstrate mild to moderate intrahepatic biliary ductal dilatation. There is a stent in the common bile duct which limits evaluation of this portion of a biliary tree. The stent does appear  grossly patent. Distal common bile duct beyond the stent is patent measuring 4 mm. No filling defect in this portion of the common bile duct to suggest choledocholithiasis at this time. Small filling defects are noted within the gallbladder, compatible with cholelithiasis. Gallbladder is moderately distended, but there is no gallbladder wall thickening. Trace amount of pericholecystic fluid and fluid adjacent to the second and third portions of the duodenum. Pancreas: No pancreatic mass. No pancreatic ductal dilatation on MRCP images. Trace amount of fluid adjacent to the inferior aspect of the pancreatic head and adjacent duodenum. Spleen: Spleen is mildly enlarged measuring 13.4 x 7.7 x 15.3 cm (estimated splenic volume of 789 mL). Adrenals/Urinary Tract: Bilateral kidneys and bilateral adrenal glands are normal in appearance. No hydroureteronephrosis in the visualized portions of the abdomen. Stomach/Bowel: Small amount of fluid adjacent to the second and third portions of the duodenum. Otherwise, visualized portions of stomach, small bowel and colon are unremarkable. Vascular/Lymphatic: No aneurysm identified in the visualized abdominal vasculature. Soft tissue mass is in the region of the porta hepatis likely reflects lymphadenopathy. The largest of these measures 5.2 x 3.5 x 4.4 cm (axial image 44 of series 1405n coronal image 67 of series 15) medial to the proximal common bile duct in the hepatoduodenal ligament. An additional lesion posterior to the common bile duct in the portacaval space measures 2.5 x 4.5 x 6.4 cm (axial image 54 of series 1405 and coronal image 57 of series 15). An additional lesion adjacent to the left lobe of the liver may represent gastrohepatic ligament lymphadenopathy or a serosal implant upon the surface of the liver measuring 4.1 x 3.6 x 4.5 cm (axial image 30 of series 1405 and coronal image 77 of series 15). Other: Multiple peritoneal implants are noted in the upper abdomen, the  largest of which include a 3.6 x 3.0 x 2.8 cm lesion in the left upper quadrant which is intimately associated with some loops of mid small bowel (axial image 54 of series 1403 and coronal image 93 of series 15), and a lesion that is slightly more cephalad in location intimately associated with the undersurface of the left hemidiaphragm (axial image 35 of series 14 of 3 and coronal image 89 of series 15) measuring 2.8 x 2.2 x 2.1 cm. Trace volume of fluid adjacent to the second and third portions of the duodenum. No larger volume of ascites. Musculoskeletal: Multiple heterogeneously enhancing lesions are again noted in the right side of the anterior  abdominal wall musculature, the largest of which measures 2.7 x 4.3 x 4.5 cm (axial image 43 of series 1403 and coronal image 111 of series 15). No aggressive appearing osseous lesions are noted in the visualized portions of the skeleton. IMPRESSION: 1. MRCP images are limited by the presence of the common bile duct stent. However, the stent appears grossly patent. There is mild to moderate intrahepatic biliary ductal dilatation. No definite choledocholithiasis within the well evaluated portions of the common bile duct. There is cholelithiasis. Gallbladder is moderately distended, and there is a trace amount of pericholecystic fluid as well as fluid adjacent to the second and third portions of the duodenum. However, there is no gallbladder wall thickening or other overt findings to strongly suggest an acute cholecystitis at this time. 2. Widespread metastatic disease noted throughout the visualized abdomen, including 3 intrahepatic metastases, extensive lymphadenopathy in the region of the hepatoduodenal ligament and gastrohepatic ligament, multiple peritoneal implants in the upper abdomen, and soft tissue metastases in the upper anterior abdominal wall musculature and overlying subcutaneous fat on the right side, as described above. Electronically Signed   By: Vinnie Langton M.D.   On: 12/25/2016 08:24   Ir Int Lianne Cure Biliary Drain With Cholangiogram  Result Date: 01/05/2017 INDICATION: 56 year old male with a history of metastatic colon cancer, status post left hepatectomy, with recurrent disease at the liver hilum contributing to biliary obstruction. Prior endoscopy with metal stent placement (uncovered) of the common hepatic duct Oct 15, 2016, with subsequent ERCP and plastic stent placement 12/16/2016. He has had persistent hyperbilirubinemia with concern for ongoing obstruction. He presents for percutaneous transhepatic cholangiogram and drain placement EXAM: PERCUTANEOUS TRANSHEPATIC CHOLANGIOGRAM WITH INTERNAL/ EXTERNAL DRAIN PLACEMENT MEDICATIONS: 25 mg IV Demerol, 4 mg IV Zofran ANESTHESIA/SEDATION: Moderate (conscious) sedation was employed during this procedure. A total of Versed 4.0 mg and Fentanyl 200 mcg was administered intravenously. Moderate Sedation Time: 50 minutes. The patient's level of consciousness and vital signs were monitored continuously by radiology nursing throughout the procedure under my direct supervision. FLUOROSCOPY TIME:  Fluoroscopy Time: 9 minutes 48 seconds (754 mGy). COMPLICATIONS: None PROCEDURE: The procedure, risks, benefits, and alternatives were explained to the patient and the patient's family. A complete informed consent was performed, with risk benefit analysis. Specific risks that were discussed for the procedure include bleeding, infection, biliary sepsis, IC use day, organ injury, need for further procedure, need for further surgery, long-term drain placement, cardiopulmonary collapse, death. Questions regarding the procedure were encouraged and answered. The patient understands and consents to the procedure. Patient is position in supine position on the fluoroscopy table, and the upper abdomen was prepped and draped in the usual sterile fashion. Maximum barrier sterile technique with sterile gowns and gloves were used for the  procedure. A timeout was performed prior to the initiation of the procedure. Local anesthesia was provided with 1% lidocaine with epinephrine. Scout images of the upper abdomen were performed. The image intensifier was angulated, right anterior oblique approximately 10-20 degrees. 1% lidocaine was used for local anesthesia, with generous infiltration of the skin and subcutaneous tissues in and inter left costal location. A Chiba needle was used to access the biliary system, targeting the cranial aspect of the indwelling metallic stent of the common hepatic duct. Once the tip of the needle was confirmed within the biliary system by injecting small aliquots of contrast, images were stored of the biliary system after partially opacifying the biliary tree via the needle. A second access was required for access  into the biliary system, from right intercostal location. Once the tip of this needle was confirmed within the biliary system, an 018 wire was advanced centrally. The needle was removed, a small incision was made with an 11 blade scalpel, and then the inner catheter from a triaxial Accustick system was advanced into the biliary system. This access was determined to be with thin a sectoral duct of the right posterior liver lobe, which enter the common hepatic duct below the cranial stent margin. A third access was required for access into the biliary system, from separate right intercostal location. Once the tip of this needle was confirmed within the biliary system, an 018 wire was advanced centrally. The needle was removed, a small incision was made with an 11 blade scalpel, and then Accustick system was advanced into the biliary system. The metal stiffener and dilator were removed, we confirmed placement with contrast infusion. A coaxial Glidewire and 4 French glide cath were then used to navigate across the obstruction at the stent. Once the catheter were within the duodenum, the wire was removed and contrast  confirmed location. A Benson wire was advanced through the system, and the Accustick and Glidewire were removed. Dilation of the subcutaneous tissue tracks was performed with an 8 [place] dilator, and then a 10 [place] biliary drain was placed as an internal/external biliary drain. Small amount of contrast confirmed location. The patient tolerated the procedure well and remained hemodynamically stable throughout. No complications were encountered and no significant blood loss was encountered. FINDINGS: Percutaneous transhepatic cholangiogram demonstrates complete occlusion of the uncovered metal stent of the common hepatic duct. More proximally there is dilation of the sectoral ducts of anterior and posterior right liver and remnant segment 4 ducts. IMPRESSION: Status post percutaneous transhepatic cholangiogram and placement of internal/external biliary drain via a right-sided intercostal approach. Signed, Dulcy Fanny. Earleen Newport, DO [facility] Electronically Signed   By: Corrie Mckusick D.O.   On: 01/05/2017 10:51    ERCP with removal of plastic stent on 01/04/2017. Biliary stent placement by IR on 01/04/2017   Subjective: Patient seen and examined at bedside. He denies any overnight fever, nausea or vomiting. He states that his pain is much better controlled and wants to go home  Discharge Exam: Vitals:   01/11/17 2017 01/12/17 0522  BP: 115/75 113/74  Pulse: 76 73  Resp: 18 18  Temp: 98.9 F (37.2 C) 97.9 F (36.6 C)   Vitals:   01/11/17 1540 01/11/17 1629 01/11/17 2017 01/12/17 0522  BP: 132/78 113/74 115/75 113/74  Pulse:  77 76 73  Resp:  17 18 18   Temp:  98.4 F (36.9 C) 98.9 F (37.2 C) 97.9 F (36.6 C)  TempSrc:  Oral Oral Oral  SpO2:  94% 97% 98%  Weight:   97.5 kg (215 lb 0.2 oz)   Height:        General: Pt is alert, awake, not in acute distress Cardiovascular: RRR, S1/S2 + Respiratory: Bilateral decreased breath sounds  at bases Abdomen: nondistended, soft and mildly tender in the right upper quadrant. Normal bowel sounds heard. Percutaneous biliary Drain  present  Extremities: No cyanosis, clubbing; trace pedal edema     The results of significant diagnostics from this hospitalization (including imaging, microbiology, ancillary and laboratory) are listed below for reference.     Microbiology: Recent Results (from the past 240 hour(s))  Culture, blood (routine x 2)     Status: None   Collection Time: 01/04/17  5:53 PM  Result Value Ref Range Status   Specimen Description BLOOD LEFT HAND  Final   Special Requests   Final    BOTTLES DRAWN AEROBIC ONLY Blood Culture adequate volume   Culture   Final    NO GROWTH 5 DAYS Performed at [facility], [address] [place], [place] [address]    Report Status 01/09/2017 FINAL  Final  Culture, blood (routine x 2)     Status: None   Collection Time: 01/04/17  6:01 PM  Result Value Ref Range Status   Specimen Description BLOOD BLOOD LEFT HAND  Final   Special Requests IN PEDIATRIC BOTTLE Blood Culture adequate volume  Final   Culture   Final    NO GROWTH 5 DAYS Performed at [facility], [place] [address], [place], [place] [address]    Report Status 01/09/2017 FINAL  Final  Body fluid culture     Status: None   Collection Time: 01/05/17  4:33 PM  Result Value Ref Range Status   Specimen Description BILE  Final   Special Requests Normal  Final   Gram Stain   Final    NO WBC SEEN FEW GRAM POSITIVE COCCI IN CLUSTERS Performed at [facility], [place] [address] [place], [place] [address]    Culture MODERATE STAPHYLOCOCCUS HAEMOLYTICUS  Final   Report Status 01/08/2017 FINAL  Final   Organism ID, Bacteria STAPHYLOCOCCUS HAEMOLYTICUS  Final      Susceptibility   Staphylococcus haemolyticus - MIC*    CIPROFLOXACIN >=8 RESISTANT Resistant     ERYTHROMYCIN >=8 RESISTANT Resistant     GENTAMICIN >=16 RESISTANT Resistant     OXACILLIN >=4  RESISTANT Resistant     TETRACYCLINE <=1 SENSITIVE Sensitive     VANCOMYCIN 1 SENSITIVE Sensitive     TRIMETH/SULFA >=320 RESISTANT Resistant     CLINDAMYCIN >=8 RESISTANT Resistant     RIFAMPIN <=0.5 SENSITIVE Sensitive     Inducible Clindamycin NEGATIVE Sensitive     * MODERATE STAPHYLOCOCCUS HAEMOLYTICUS     Labs: BNP (last 3 results) No results for input(s): BNP in the last 8760 hours. Basic Metabolic Panel:  Recent Labs Lab 01/06/17 0520 01/07/17 0624 01/09/17 0500 01/10/17 0417 01/11/17 0500 01/12/17 0500  NA 137 134* 136  --  137 136  K 3.7 4.1 3.8  --  3.6 3.8  CL 103 100* 98*  --  102 100*  CO2 22 26 28   --  28 26  GLUCOSE 101* 103* 89  --  108* 98  BUN 17 18 10   --  8 11  CREATININE 1.08 1.04 1.00 0.97 1.05 1.21  CALCIUM 9.0 8.9 8.8*  --  8.9 8.8*  MG  --  1.8  --   --  1.8 1.9   Liver Function Tests:  Recent Labs Lab 01/06/17 0520 01/07/17 0624 01/09/17 0500 01/11/17 0500 01/12/17 0500  AST 148* 69* 33 37 33  ALT 163* 113* 62 53 45  ALKPHOS 286* 225* 156* 134* 125  BILITOT 3.7* 2.7* 2.2* 2.0* 2.1*  PROT 7.6 7.6 7.1 7.2 7.1  ALBUMIN 3.2* 3.1* 2.9* 3.0* 2.9*    Recent Labs Lab 01/07/17 0624  AMYLASE 16*   No results for input(s): AMMONIA in the last 168 hours. CBC:  Recent Labs Lab 01/06/17 0520 01/07/17 0624 01/09/17 0500 01/11/17 0500 01/12/17 0500  WBC 8.9 5.1 4.2 6.8 5.8  NEUTROABS  --  2.9 2.3 4.4 3.3  HGB 12.0* 11.3* 10.3*  10.7* 10.7*  HCT 36.2* 33.8* 31.0* 31.7* 31.5*  MCV 97.8 98.5 97.2 95.5 97.8  PLT 131* 145* 130* 196 196   Cardiac Enzymes: No results for input(s): CKTOTAL, CKMB, CKMBINDEX, TROPONINI in the last 168 hours. BNP: Invalid input(s): POCBNP CBG:  Recent Labs Lab 01/06/17 0804 01/07/17 0712 01/08/17 0810 01/09/17 0729  GLUCAP 104* 89 119* 97   D-Dimer No results for input(s): DDIMER in the last 72 hours. Hgb A1c No results for input(s): HGBA1C in the last 72 hours. Lipid Profile No results for  input(s): CHOL, HDL, LDLCALC, TRIG, CHOLHDL, LDLDIRECT in the last 72 hours. Thyroid function studies No results for input(s): TSH, T4TOTAL, T3FREE, THYROIDAB in the last 72 hours.  Invalid input(s): FREET3 Anemia work up No results for input(s): VITAMINB12, FOLATE, FERRITIN, TIBC, IRON, RETICCTPCT in the last 72 hours. Urinalysis    Component Value Date/Time   COLORURINE AMBER (A) 12/08/2016 1708   APPEARANCEUR HAZY (A) 12/08/2016 1708   LABSPEC 1.035 (H) 12/08/2016 1708   LABSPEC 1.020 03/18/2015 0847   PHURINE 5.0 12/08/2016 1708   GLUCOSEU NEGATIVE 12/08/2016 1708   GLUCOSEU Negative 03/18/2015 0847   HGBUR NEGATIVE 12/08/2016 1708   HGBUR small 04/01/2010 0848   BILIRUBINUR MODERATE (A) 12/08/2016 1708   BILIRUBINUR Negative 03/18/2015 0847   KETONESUR NEGATIVE 12/08/2016 1708   PROTEINUR 100 (A) 12/08/2016 1708   UROBILINOGEN 0.2 03/18/2015 0847   NITRITE NEGATIVE 12/08/2016 1708   LEUKOCYTESUR NEGATIVE 12/08/2016 1708   LEUKOCYTESUR Negative 03/18/2015 0847   Sepsis Labs Invalid input(s): PROCALCITONIN,  WBC,  LACTICIDVEN Microbiology Recent Results (from the past 240 hour(s))  Culture, blood (routine x 2)     Status: None   Collection Time: 01/04/17  5:53 PM  Result Value Ref Range Status   Specimen Description BLOOD LEFT HAND  Final   Special Requests   Final    BOTTLES DRAWN AEROBIC ONLY Blood Culture adequate volume   Culture   Final    NO GROWTH 5 DAYS Performed at [facility], [address], [place], [place] [address]    Report Status 01/09/2017 FINAL  Final  Culture, blood (routine x 2)     Status: None   Collection Time: 01/04/17  6:01 PM  Result Value Ref Range Status   Specimen Description BLOOD BLOOD LEFT HAND  Final   Special Requests IN PEDIATRIC BOTTLE Blood Culture adequate volume  Final   Culture   Final    NO GROWTH 5 DAYS Performed at [facility], [place] [address] [place], [place] [address]    Report Status 01/09/2017 FINAL   Final  Body fluid culture     Status: None   Collection Time: 01/05/17  4:33 PM  Result Value Ref Range Status   Specimen Description BILE  Final   Special Requests Normal  Final   Gram Stain   Final    NO WBC SEEN FEW GRAM POSITIVE COCCI IN CLUSTERS Performed at [facility], [place] [address] [place], [place] [address]    Culture MODERATE STAPHYLOCOCCUS HAEMOLYTICUS  Final   Report Status 01/08/2017 FINAL  Final   Organism ID, Bacteria STAPHYLOCOCCUS HAEMOLYTICUS  Final      Susceptibility   Staphylococcus haemolyticus - MIC*    CIPROFLOXACIN >=8 RESISTANT Resistant     ERYTHROMYCIN >=8 RESISTANT Resistant     GENTAMICIN >=16 RESISTANT Resistant     OXACILLIN >=4 RESISTANT Resistant     TETRACYCLINE <=1 SENSITIVE Sensitive  VANCOMYCIN 1 SENSITIVE Sensitive     TRIMETH/SULFA >=320 RESISTANT Resistant     CLINDAMYCIN >=8 RESISTANT Resistant     RIFAMPIN <=0.5 SENSITIVE Sensitive     Inducible Clindamycin NEGATIVE Sensitive     * MODERATE STAPHYLOCOCCUS HAEMOLYTICUS     Time coordinating discharge: 35 minutes  SIGNED:   Aline August, MD  [facility] 01/12/2017, 10:58 AM Pager: [phone]  If 7PM-7AM, please contact night-coverage [email or web address] Password [email or web address]

## 2017-01-13 ENCOUNTER — Telehealth: Payer: Self-pay | Admitting: *Deleted

## 2017-01-13 NOTE — Telephone Encounter (Signed)
Pt states he is not wanting to schedule hospital f/u

## 2017-01-13 NOTE — Telephone Encounter (Signed)
Transition Care Management Follow-up Telephone Call   Date discharged? 01/12/2017   How have you been since you were released from the hospital? "good"   Do you understand why you were in the hospital? yes   Do you understand the discharge instructions? yes   Where were you discharged to? home   Items Reviewed:  Medications reviewed: pt not wanting to review any of the items listed below. States he is familiar with all  Allergies reviewed:   Dietary changes reviewed:   Referrals reviewed   Functional Questionnaire:  Activities of Daily Living (ADLs):   He states they are independent in the following: independent in all areas   Any transportation issues/concerns?: no   Any patient concerns? no   Confirmed importance and date/time of follow-up visits scheduled pt not wanting to schedule a f/u appt  Confirmed with patient if condition begins to worsen call PCP or go to the ER.  Patient was given the office number and encouraged to call back with question or concerns.  : yes

## 2017-01-15 ENCOUNTER — Ambulatory Visit (HOSPITAL_COMMUNITY)
Admission: RE | Admit: 2017-01-15 | Discharge: 2017-01-15 | Disposition: A | Discharge status: Home / Self Care | Payer: 59 | Source: Ambulatory Visit | Attending: Radiology | Admitting: Radiology

## 2017-01-15 ENCOUNTER — Encounter (HOSPITAL_COMMUNITY): Payer: Self-pay | Admitting: Interventional Radiology

## 2017-01-15 ENCOUNTER — Ambulatory Visit (HOSPITAL_BASED_OUTPATIENT_CLINIC_OR_DEPARTMENT_OTHER): Payer: 59 | Admitting: Nurse Practitioner

## 2017-01-15 ENCOUNTER — Telehealth: Payer: Self-pay | Admitting: *Deleted

## 2017-01-15 ENCOUNTER — Telehealth: Payer: Self-pay | Admitting: Oncology

## 2017-01-15 ENCOUNTER — Other Ambulatory Visit: Payer: Self-pay | Admitting: Radiology

## 2017-01-15 ENCOUNTER — Other Ambulatory Visit: Payer: Self-pay | Admitting: Nurse Practitioner

## 2017-01-15 ENCOUNTER — Other Ambulatory Visit (HOSPITAL_BASED_OUTPATIENT_CLINIC_OR_DEPARTMENT_OTHER): Payer: 59

## 2017-01-15 DIAGNOSIS — K77 Liver disorders in diseases classified elsewhere: Secondary | ICD-10-CM

## 2017-01-15 DIAGNOSIS — C787 Secondary malignant neoplasm of liver and intrahepatic bile duct: Secondary | ICD-10-CM | POA: Diagnosis not present

## 2017-01-15 DIAGNOSIS — C189 Malignant neoplasm of colon, unspecified: Secondary | ICD-10-CM

## 2017-01-15 DIAGNOSIS — Z4682 Encounter for fitting and adjustment of non-vascular catheter: Secondary | ICD-10-CM | POA: Diagnosis not present

## 2017-01-15 DIAGNOSIS — C182 Malignant neoplasm of ascending colon: Secondary | ICD-10-CM | POA: Diagnosis not present

## 2017-01-15 DIAGNOSIS — G893 Neoplasm related pain (acute) (chronic): Secondary | ICD-10-CM | POA: Diagnosis not present

## 2017-01-15 DIAGNOSIS — R17 Unspecified jaundice: Secondary | ICD-10-CM

## 2017-01-15 DIAGNOSIS — Z85038 Personal history of other malignant neoplasm of large intestine: Secondary | ICD-10-CM | POA: Diagnosis not present

## 2017-01-15 HISTORY — PX: IR CHOLANGIOGRAM EXISTING TUBE: IMG6040

## 2017-01-15 LAB — COMPREHENSIVE METABOLIC PANEL
ALBUMIN: 3.1 g/dL — AB (ref 3.5–5.0)
ALK PHOS: 160 U/L — AB (ref 40–150)
ALT: 45 U/L (ref 0–55)
AST: 44 U/L — AB (ref 5–34)
Anion Gap: 9 mEq/L (ref 3–11)
BILIRUBIN TOTAL: 2.47 mg/dL — AB (ref 0.20–1.20)
BUN: 11.6 mg/dL (ref 7.0–26.0)
CO2: 27 meq/L (ref 22–29)
Calcium: 9.8 mg/dL (ref 8.4–10.4)
Chloride: 103 mEq/L (ref 98–109)
Creatinine: 1.5 mg/dL — ABNORMAL HIGH (ref 0.7–1.3)
EGFR: 53 mL/min/{1.73_m2} — AB (ref >90)
GLUCOSE: 101 mg/dL (ref 70–140)
Potassium: 4.2 mEq/L (ref 3.5–5.1)
SODIUM: 139 meq/L (ref 136–145)
TOTAL PROTEIN: 8 g/dL (ref 6.4–8.3)

## 2017-01-15 LAB — CBC WITH DIFFERENTIAL/PLATELET
BASO%: 0.3 % (ref 0.0–2.0)
Basophils Absolute: 0 10*3/uL (ref 0.0–0.1)
EOS ABS: 0.2 10*3/uL (ref 0.0–0.5)
EOS%: 2.8 % (ref 0.0–7.0)
HCT: 35.6 % — ABNORMAL LOW (ref 38.4–49.9)
HEMOGLOBIN: 11.9 g/dL — AB (ref 13.0–17.1)
LYMPH%: 15.7 % (ref 14.0–49.0)
MCH: 33.1 pg (ref 27.2–33.4)
MCHC: 33.4 g/dL (ref 32.0–36.0)
MCV: 98.9 fL — AB (ref 79.3–98.0)
MONO#: 0.6 10*3/uL (ref 0.1–0.9)
MONO%: 8.8 % (ref 0.0–14.0)
NEUT%: 72.4 % (ref 39.0–75.0)
NEUTROS ABS: 4.8 10*3/uL (ref 1.5–6.5)
Platelets: 261 10*3/uL (ref 140–400)
RBC: 3.6 10*6/uL — AB (ref 4.20–5.82)
RDW: 16.9 % — AB (ref 11.0–14.6)
WBC: 6.6 10*3/uL (ref 4.0–10.3)
lymph#: 1 10*3/uL (ref 0.9–3.3)

## 2017-01-15 MED ORDER — IOPAMIDOL (ISOVUE-300) INJECTION 61%
50.0000 mL | Freq: Once | INTRAVENOUS | Status: AC | PRN
  Administered 2017-01-15: 7 mL

## 2017-01-15 MED ORDER — HYDROMORPHONE HCL 2 MG PO TABS: 4.0000 mg | ORAL_TABLET | ORAL | 0 refills | Status: DC | PRN

## 2017-01-15 MED ORDER — MORPHINE SULFATE ER 15 MG PO TBCR: 15.0000 mg | EXTENDED_RELEASE_TABLET | Freq: Two times a day (BID) | ORAL | 0 refills | Status: DC

## 2017-01-15 MED ORDER — OXYCODONE HCL ER 20 MG PO T12A: 20.0000 mg | EXTENDED_RELEASE_TABLET | Freq: Two times a day (BID) | ORAL | 0 refills | Status: DC

## 2017-01-15 MED ORDER — IOPAMIDOL (ISOVUE-300) INJECTION 61%
INTRAVENOUS | Status: AC
  Administered 2017-01-15: 7 mL
  Filled 2017-01-15: qty 50

## 2017-01-15 NOTE — Telephone Encounter (Signed)
Gave patient avs and calendar for his upcoming appts.

## 2017-01-15 NOTE — Procedures (Signed)
Cholangiogram performed via existing PBD demonstrates appropriate positioning and functionality.  No exchange performed.   Ronny Bacon, MD Pager #: [phone]

## 2017-01-15 NOTE — Telephone Encounter (Signed)
Late entry: Received 2 messages from [place] at [facility] for prior auth for Oxycontin. Called # provided [phone]. Was told we will receive fax in 24 hours. Called pt's pharmacy, [facility] Contin has already been filled for pt.

## 2017-01-15 NOTE — Progress Notes (Addendum)
[facility] OFFICE PROGRESS NOTE   Diagnosis:  Colon cancer  INTERVAL HISTORY:   Bryan Fields returns as scheduled. He was hospitalized 01/04/2017 after removal of a bile duct stent and placement of a percutaneous biliary drain. He developed rigors after the procedure and was admitted for further evaluation. He remained hospitalized with right upper abdominal pain. He had a fever area culture from the biliary drainage returned positive for staphylococcus hemolyticus. He was treated with vancomycin. He was transitioned to doxycycline at discharge 01/12/2017.  He continues doxycycline. No fever or chills. He has had mild nausea the past few days, better today. He notes that his stools are "white".  He was discharged from the hospital on OxyContin and Dilaudid as needed. He reports his pharmacy would not fill the OxyContin prescription due to it being written incorrectly.  Objective:  Vital signs in last 24 hours:  Blood pressure 119/64, pulse 74, temperature 98.1 F (36.7 C), temperature source Oral, resp. rate 18, height '5\' 11"'$  (1.803 m), weight 218 lb 14.4 oz (99.3 kg), SpO2 100 %.    HEENT: No thrush or ulcers. Resp: Lungs clear bilaterally. Cardio: Regular rate and rhythm. GI: Right upper quadrant drain site is covered with a gauze dressing. Abdomen is distended. Palpable mass right upper mid abdomen. Vascular: No leg edema. Port-A-Cath without erythema.  Lab Results:  Lab Results  Component Value Date   WBC 6.6 01/15/2017   HGB 11.9 (L) 01/15/2017   HCT 35.6 (L) 01/15/2017   MCV 98.9 (H) 01/15/2017   PLT 261 01/15/2017   NEUTROABS 4.8 01/15/2017   Creatinine 1.5, bilirubin 2.47 Imaging:  No results found.  Medications: I have reviewed the patient's current medications.  Assessment/Plan: 1. 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  Microsatellite stable; preserved expression of major and minor MMR proteins. He began treatment with FOLFOX in December 2011. He completed cycle #12 on 10/27/2010. Oxaliplatin was held with cycles number 7 through 10 and resumed with cycle #11. 1. History of neutropenia secondary to chemotherapy. 2. History of thrombocytopenia secondary chemotherapy. 3. He did not undergo a preoperative colonoscopy. He underwent a colonoscopy on 12/19/2010 with findings of a 1 cm polyp at 90 cm from the anal verge, minimal polyp at 30 cm from the anal verge and minimal diverticulosis. Colonoscopy 03/18/2012-negative. 4. 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 He began study drug on 05/13/2010. 5. Status post Port-A-Cath placement. The Port-A-Cath has been removed. 6. 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 8. Low attenuation lesion in the caudate lobe of the liver on the CT 04/11/2012-? Cyst. MRI liver on 04/20/2012 favored the caudate lobe liver lesion to represent a cyst or complex cyst. CT abdomen/pelvis 04/17/2013 with continued enlargement of hypovascular lesion in the caudate lobe of the liver.  PET scan 10/27/2011 confirmed a hypermetabolic caudate lobe liver lesion, tiny indeterminate lung nodules, and a 9 mm level II left neck node   CT biopsy of the liver lesion 11/07/2013 confirmed adenocarcinoma  Left liver and caudate resection 01/15/2014-pathology confirmed metastatic colon cancer, and negative surgical margins  Surveillance CT scans 07/24/2014 consistent with recurrent disease involving upper abdominal lymph nodes, peritoneal implants, and an anterior abdominal wall mass  Status post biopsy of the anterior abdominal wall mass 08/06/2014 with pathology  showing metastatic adenocarcinoma consistent with a colon  primary.  [id number] 1/28  Cycle 1 FOLFIRI/Avastin on the [facility] 08/20/2014  Cycle 2 FOLFIRI/Avastin 09/03/2014  Cycle 3 held on 09/17/2014 due to neutropenia  Cycle 3 FOLFIRI/Avastin 09/25/2014. Neulasta added beginning with cycle 3.  Cycle 4 FOLFIRI/Avastin 10/08/2014  Restaging CT scans 10/18/2014 with slight interval increase in the size of the soft tissue mass in the subcutaneous fat of the epigastric region. Underlying peritoneal implants, probable focus of residual disease along the resected margin of the liver and hepatoduodenal ligament lymphadenopathy all appeared slightly improved. No new sites of metastatic disease otherwise noted. New left lower lobe bronchopneumonia.  Cycle 5 FOLFIRI/Avastin 10/22/2014  Cycle 6 FOLFIRI/Avastin 11/06/2014  Cycle 7 FOLFIRI Avastin 11/19/2014  Cycle 8 FOLFIRI/Avastin 12/03/2014  CT 12/14/2014 with stable disease  Cycle 9 FOLFIRI/Avastin 12/17/2014  Cycle 10 FOLFIRI/Avastin 01/07/2015 (5-fluorouracil and leucovorin dose reduced)  Cycle 11 FOLFIRI/Avastin 01/21/2015  Cycle 12 FOLFIRI/Avastin 02/04/2015  CT 02/13/2015 with stable disease  Cycle 13 FOLFIRI/Avastin 02/25/2015-changed to an every three-week protocol, now off study  Avastin held with cycle 14 FOLFIRI on 03/25/2015 secondary to gingivitis  Restaging CTs 05/20/2015-slight increase in the size of the epigastric subcutaneous mass, stable peritoneal implants  FOLFIRI continued on a 3 week schedule  Avastin resumed 06/17/2015  CTs 12/23/2015-stable subxiphoid mass, stable low-attenuation mass adjacent to the left liver surgical margin, peritoneal lesions-stable  FOLFIRI continued on a 4 week schedule.  Avastin placed on hold 01/06/2016  FOLFIRI discontinued 03/02/2016 secondary to enlargement of the abdominal wall mass with new superficial fungating nodules involving the skin  Palliative radiation to the abdominal wall mass completed 03/20/2016  Cycle 1  FOLFOX12/27/2017  Cycle 2 CAPOX 06/22/2016  Clinical progression 07/07/2016 and allergic reaction to oxaliplatin 06/23/2015, CAPOX discontinued  Resection of the main abdominal wall mass and an adjacent peritoneal mass on 07/23/2016 with the pathology confirming metastatic colon cancer  [facility]aging CTs at [facility] 09/18/2016-new liver metastasis, progressive soft tissue masses at the liver resection margin, new mass adjacent to the chest wall resection site, increase in size of peritoneal nodules  CT abdomen/pelvis 10/02/2016-interval progression of periportal adenopathy and recurrence along the hepatic resection margin. New hepatic program lesion superior right hepatic lobe. Interval increase in size of peritoneal implants left upper quadrant. Enlargement of anabdominal wall mass along the right rectus muscle.  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 This stricture was stented with a 6cm long, uncovered 24m diameter metal bile duct stent in good position.   Initiation of Lonsurf cycle 1 11/17/2016  MRI abdomen 12/24/2016-mild to moderate intrahepatic biliary ductal dilatation; stent in the common bile duct appears grossly patent; 3 intrahepatic metastases, extensive lymphadenopathy hepatoduodenal ligament and gastrohepatic ligament, multiple peritoneal implants in the upper abdomen and soft tissue metastases in the upper anterior abdominal wall musculature and overlying subcutaneous fat on the right side. 10. Iron deposition within the liver noted on MRI 04/20/2012. The ferritin level was normal on 04/17/2013. Increased iron deposition noted on the left liver resection 01/15/2014 11. Pain/bleeding at the anus reported when here 04/24/2013. Status post evaluation by Dr. NLucia Gaskinswith findings of an anal fissure. 12. Right face cystic lesion 13. Status post Port-A-Cath placement 08/14/2014 14. Delayed nausea  following FOLFIRI, prophylactic Decadron added with cycle 2 with improvement. 15. Chest CT 10/18/2014 with new left lower lobe bronchopneumonia. Levaquin initiated. Resolved on CT 12/14/2014 16. Gum pain-mucositis?, Gingivitis?-Improved with discontinuation of Avastin  17. Pain/tenderness, mild erythema and full appearance over the maxillary sinuses. Maxillofacial CT 01/30/2015 with mild to moderate bilateral maxillary and sphenoid sinus inflammatory changes. Multifocal right maxillary dental periapical lucency. Status post a right upper tooth extraction with resolution of the pain. 18. Admission 10/21/2016 with nausea/vomiting-x-ray evidence for a partial small bowel obstruction, clinical improvement with bowel rest, recurrent obstructive symptoms during the week of 11/30/2016 19. Admission 12/08/2016 with a high fever-Klebsiella bacteremia confirmed, likely a biliary source  20. Mild neutropenia and anemia/thrombocytopenia secondary to chemotherapy-the platelet count and white count have recovered during this hospital admission  21. Biliary obstruction-CT 12/14/2016 revealed intrahepatic biliary dilatation despite a common bile duct stent distended gallbladder  Nuclear medicine biliary scan 12/16/2016-poor hepatic uptake with nonvisualization of the bile ducts consistent with biliary obstruction  ERCP 12/16/2016 confirmed intrahepatic biliary dilatation, status post placement of a new bile duct stent  MRI abdomen 12/24/2016-mild to moderate intrahepatic biliary ductal dilatation; stent in the common bile duct appears grossly patent; 3 intrahepatic metastases, extensive lymphadenopathy hepatoduodenal ligament and gastrohepatic ligament, multiple peritoneal implants in the upper abdomen and soft tissue metastases  in the upper anterior abdominal wall musculature and overlying subcutaneous fat on the right side.  ERCP 01/04/2017-plastic biliary stent removed, metal stent left in place, portal hypertensive changes noted  Percutaneous transhepatic cholangiogram 01/04/2017 confirmed complete occlusion of the metal stent with proximal dilation of bile ducts, status post placement of an internal/external biliary drain  22.  Fever following the 01/04/2017 ERCP procedure-biliary drainage positive for Staphylococcus hemolyticus  23.  Pain secondary to metastatic colon cancer. On OxyContin with Dilaudid as needed for breakthrough pain.   Disposition: Mr. Hearty appears unchanged. The bilirubin remains mildly elevated. He and his wife understand the bilirubin is outside of the recommended parameter for initiating Lonsurf. He will return for a repeat chemistry panel 01/18/2017. The plan is to begin Lonsurf on 01/18/2017 if the bilirubin is better.  He was provided with prescriptions for OxyContin 20 mg every 12 hours and Dilaudid 4 mg every 4 hours as needed for breakthrough pain.  He will continue to follow-up with interventional radiology regarding the biliary drain.  We scheduled a return visit on 02/01/2017. He will contact the office in the interim with any problems.  Patient seen with Dr. Benay Spice. 25 minutes were spent face-to-face at today's visit with the majority of that time involved in counseling/coordination of care.    Ned Card ANP/GNP-BC   01/15/2017  2:28 PM  This was a shared visit with Ned Card. Mr. Portales appears stable. 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 He will continue oxycodone and Dilaudid for pain.  The plan is to begin salvage therapy with Lonsurf if the bilirubin on a chemistry panel 01/18/2017.  He will continue management of the biliary drain with interventional radiology.  Julieanne Manson, M.D.

## 2017-01-16 ENCOUNTER — Encounter (HOSPITAL_COMMUNITY): Payer: Self-pay | Admitting: Emergency Medicine

## 2017-01-16 ENCOUNTER — Emergency Department (HOSPITAL_COMMUNITY)
Admission: EM | Admit: 2017-01-16 | Discharge: 2017-01-16 | Disposition: A | Discharge status: Home / Self Care | Payer: 59 | Attending: Emergency Medicine | Admitting: Emergency Medicine

## 2017-01-16 DIAGNOSIS — K831 Obstruction of bile duct: Secondary | ICD-10-CM | POA: Diagnosis not present

## 2017-01-16 DIAGNOSIS — I10 Essential (primary) hypertension: Secondary | ICD-10-CM | POA: Diagnosis not present

## 2017-01-16 DIAGNOSIS — Z87891 Personal history of nicotine dependence: Secondary | ICD-10-CM | POA: Diagnosis not present

## 2017-01-16 DIAGNOSIS — Z85828 Personal history of other malignant neoplasm of skin: Secondary | ICD-10-CM | POA: Insufficient documentation

## 2017-01-16 DIAGNOSIS — Z79899 Other long term (current) drug therapy: Secondary | ICD-10-CM | POA: Diagnosis not present

## 2017-01-16 DIAGNOSIS — K9189 Other postprocedural complications and disorders of digestive system: Secondary | ICD-10-CM | POA: Diagnosis present

## 2017-01-16 NOTE — Discharge Instructions (Signed)
Return here as needed. °

## 2017-01-16 NOTE — ED Triage Notes (Signed)
Patient here from home. States that his biliary drain is not draining. Was seen at the cancer center yesterday for same and reports that it was working well then.
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
## 2017-01-18 ENCOUNTER — Telehealth: Payer: Self-pay

## 2017-01-18 ENCOUNTER — Encounter: Payer: Self-pay | Admitting: Oncology

## 2017-01-18 ENCOUNTER — Other Ambulatory Visit (HOSPITAL_BASED_OUTPATIENT_CLINIC_OR_DEPARTMENT_OTHER): Payer: 59

## 2017-01-18 ENCOUNTER — Encounter: Payer: 59 | Admitting: Oncology

## 2017-01-18 ENCOUNTER — Ambulatory Visit: Payer: 59

## 2017-01-18 DIAGNOSIS — C189 Malignant neoplasm of colon, unspecified: Secondary | ICD-10-CM

## 2017-01-18 DIAGNOSIS — C787 Secondary malignant neoplasm of liver and intrahepatic bile duct: Secondary | ICD-10-CM | POA: Diagnosis not present

## 2017-01-18 DIAGNOSIS — C182 Malignant neoplasm of ascending colon: Secondary | ICD-10-CM | POA: Diagnosis not present

## 2017-01-18 LAB — COMPREHENSIVE METABOLIC PANEL
ALBUMIN: 2.9 g/dL — AB (ref 3.5–5.0)
ALK PHOS: 136 U/L (ref 40–150)
ALT: 62 U/L — ABNORMAL HIGH (ref 0–55)
AST: 58 U/L — ABNORMAL HIGH (ref 5–34)
Anion Gap: 9 mEq/L (ref 3–11)
BUN: 12.8 mg/dL (ref 7.0–26.0)
CHLORIDE: 102 meq/L (ref 98–109)
CO2: 25 meq/L (ref 22–29)
Calcium: 9.2 mg/dL (ref 8.4–10.4)
Creatinine: 1.4 mg/dL — ABNORMAL HIGH (ref 0.7–1.3)
EGFR: 54 mL/min/{1.73_m2} — AB (ref >90)
GLUCOSE: 108 mg/dL (ref 70–140)
POTASSIUM: 4 meq/L (ref 3.5–5.1)
SODIUM: 136 meq/L (ref 136–145)
Total Bilirubin: 1.86 mg/dL — ABNORMAL HIGH (ref 0.20–1.20)
Total Protein: 7.4 g/dL (ref 6.4–8.3)

## 2017-01-18 NOTE — Telephone Encounter (Signed)
Pt came in for MS rx - given Pt came in for labs - awaiting results to determine if he starts lonsurf today  Pt came in b/c his biliary drain leaked through the dressing, PJs, bedding into the mattress pad this AM 8/13. Wife describes that on Friday 8/10 they went to IR. On Saturday there was inability to flush biliary drain and no fluid in bulb so they went to ER 8/11. ER found that there was a cap on the biliary drain. ER set it up to drain to bulb.  This RN called IR. They had purposefully placed the cap on drain for a trial capping. They said pt had IR card with their phone number. Wife and pt said they were not told that the drain had been capped. Wife said the paperwork did not mention the drain was capped. And they do not have IR phone number. They would not have gone to ER if they had known the biliary drain had been capped.   Pt set up with [facility] to do dressing change and assess biliary drain site.

## 2017-01-18 NOTE — Patient Instructions (Signed)
Biliary Drainage Catheter Home Guide  A biliary drainage catheter is a thin, flexible tube that is inserted through your skin into the bile ducts in your liver. Bile is a thick yellow or green fluid that helps digest fat in foods. The purpose of a biliary drainage catheter is to keep bile from backing up into your liver. Backup of bile can occur when there is a blockage that prevents bile from moving from the bile ducts into the small intestine as it should. The blockage can be caused by gallstones, a tumor, or scar tissue. There are three types of biliary drainage:   External biliary drainage. With this type, bile is only drained into a collection bag outside your body (external collection bag).   Internal-external biliary drainage. With this type, bile is drained to an external collection bag as well as into your small intestine.   Internal biliary drainage. With this type, bile is only drained into your small intestine.    General home care includes these daily actions:   Inspection of your drainage catheter.   Flushing your drainage catheter with saline.   Emptying drainage from the collection bag (if present).   Recording the amount of drainage.   Checking the catheter insertion site for signs of infection. Check for:  ? Redness, swelling, or pain.  ? Fluid or blood.  ? Warmth.  ? Pus or a bad smell.  How do I inspect my drainage catheter?   Check the dressing to make sure that it is dry and clean.   Look at the skin around the drainage catheter when changing the dressing for any problems such as redness, rash, or skin breakdown.   Check the drainage bag to make sure that drainage fluid is flowing into the bag well. Note the color and amount compared to other days.   Check the drainage catheter and bag for any cracks or kinks in the tubing.  How do I change my dressing?  The dressing over the drainage catheter should be changed every other day, or more often if needed to keep the dressing dry. Your  health care provider will instruct you about how often to change your dressing.  Supplies needed:   Mild soap and warm water.   Split gauze pads, 4 x 4 inches (10 x 10 cm) to use as a dressing sponge.   Gauze pads, 4 x 4 inches (10 x 10 cm) or adhesive dressing cover.   Paper tape.  How to change the dressing:  1. Wash your hands with soap and water.  2. Gently remove the old dressing. Avoid using scissors to remove the dressing because they may damage the drainage catheter.  3. Wash the skin around the insertion site with mild soap and warm water, rinse well, then pat the area dry with a clean cloth.  4. Check the skin around the drainage catheter for redness or swelling, or for yellow or green discharge that has a bad smell.  5. If the drainage catheter was stitched (sutured) to the skin, inspect the suture to make sure it is still anchored in the skin.  6. Do not apply creams, ointments, or alcohol to the site. Allow the skin to air-dry completely before you apply a new dressing.  7. Place the drainage catheter through the slit in a dressing sponge. The dressing sponge should slide under the disk that holds the drainage catheter in place.  8. Cover the drainage catheter and the dressing sponge with a 4   x 4 inch (10 x 10 cm) gauze. The drainage catheter should rest on the gauze and not on the skin.  9. Tape the dressing to the skin.  10. You may be instructed to use an adhesive dressing covering over the top of this in place of the gauze and tape.  11. Wash your hands with soap and water.  How do I flush my drainage catheter?  Biliary drainagecatheters should be flushed daily, or as often as told by your health care provider. The end of the drainage catheter is closed using an IV cap. A syringe can be directly connected to the IV cap.  Supplies needed:   Alcohol swab.   10 mL prefilled normal saline syringe.  How to flush the drainage catheter:  1. Wash your hands with soap and water.  2. If your drainage  catheter has a stopcock attached to it, turn the stopcock toward the drainage bag. This will allow the saline to flow in the direction of your body.  3. Clean the IV cap with an alcohol swab.  4. Screw the tip of a 10 mL normal saline syringe onto the IV cap.  5. Inject the saline over 5-10 seconds. If you feel resistance while injecting, stop immediately. Avoid  pulling back on the plunger. Doing that could increase your risk of infection.  6. Remove the syringe from the cap. Turn the stopcock so that fluid flows from your body into the drainage bag. You may notice more fluid flowing into the bag after you have completed the flush.  How do I attach a bag to my drainage catheter?  If you are having trouble with your internal biliary drain, you may be directed by your health care provider to use bag drainage until you can be seen to fix the problem. For this reason, you should always have a collection bag and connecting tubing at home. If you do not have these supplies, remember to ask for them at your next appointment.  1. Remove the bag and the connecting tubing from their packaging.  2. Connect the funnel end of the tubing to the bag's cone-shaped stem.  3. Remove the IV cap from the biliary drain. To do this, unscrew it and replace it with the screw-on end of the tubing.  4.     How do I empty my collection bag?  Empty the collection bag whenever it becomes 2/3 full. Also empty it before you go to sleep. Most collection bags have a drainage valve at the bottom so the bag can be that allows them to be emptied easily.  1. Wash your hands with soap and water.  2. Hold the collection bag over the toilet, basin, or collection container. Use a measuring container if your health care provider told you to measure the drainage.  3. Unscrew the valve to open it, and allow the bag to drain.  4. Close the valve securely to avoid leakage.  5. Use a tissue or disposable napkin to wipe the valve clean.  6. Wash the measuring  container with soap and water.  7. Record the amount of drainage as told by your health care provider.    Contact a health care provider if:   Your pain gets worse after it had improved, and it is not relieved with pain medicines.   You have any questions about caring for your drainage catheter or collection bag.   You have any of these around your catheter insertion site or coming   from it:  ? Skin breakdown.  ? Redness, swelling, or pain.  ? Fluid or blood.  ? Warmth to the touch.  ? Pus or a bad smell.  Get help right away if:   You have a fever or chills.   Your redness, swelling, or pain at the catheter insertion site gets worse, even though you are cleaning it well.   You have leakage of bile around the drainage catheter.   Your drainage catheter becomes blocked or clogged.   Your drainage catheter comes out.  This information is not intended to replace advice given to you by your health care provider. Make sure you discuss any questions you have with your health care provider.  Document Released: 03/15/2013 Document Revised: 04/13/2016 Document Reviewed: 04/13/2016  Elsevier Interactive Patient Education  2017 [employer]

## 2017-01-18 NOTE — Progress Notes (Signed)
Pt's Oxycontin was denied.  Gave denial to the nurse.  ° ° °

## 2017-01-18 NOTE — Progress Notes (Signed)
Pt came to [facility] center for labs this am and also wanted to have his biliary drain checked as it leaked quite a bit last night. Wife had changed the dressing last night. According to the wife, the dressing was saturated with bile and leaked through to the sheets and mattress pad.   Dressing dry and intact upon inspection, Dressing removed and site clean and dry. Small amount of redness around biliary drain tube insertion site, Re-dressed. Bile is free flowing into drainage bag. Flushed with 5 ml saline without resistance. Added adaptor/connector to drain tube that is easily connected to the drainage bag for drainage but when drainage bag removed it will not leak.  Suspect pt laid on tubing and kinked the tubing thus causing leakage.  OK per Dr. Benay Spice for pt to start the [place]. Pt made aware. No other concerns at this time.

## 2017-01-21 ENCOUNTER — Telehealth: Payer: Self-pay

## 2017-01-21 NOTE — Telephone Encounter (Signed)
Pt is having cramping in his lower abdomen. He had stopped the senokot on Monday, d/t diarrhea stools. Today he has had 3 soft BMs. Stool is pale yellow/brown. The lower abdominal cramping has been going on since about 8/9, he cramps during BM mostly, 8/10 pain when occurring. No change in eating pattern but appetite not strong. No urinary symptoms. The biliary drain is draining well, no leaking.   S/w dr Benay Spice and called wife back. Use his pain meds to help with cramping. Be aware these can cause constipation and continue with laxatives and softeners as needed. He is at risk for obstruction. To eat low residue mechanical soft diet, tending more toward liquids this weekend to see if that helps. She states he is on soft diet already.

## 2017-01-25 ENCOUNTER — Other Ambulatory Visit (HOSPITAL_COMMUNITY): Payer: Self-pay | Admitting: Interventional Radiology

## 2017-01-25 DIAGNOSIS — C799 Secondary malignant neoplasm of unspecified site: Principal | ICD-10-CM

## 2017-01-25 DIAGNOSIS — C189 Malignant neoplasm of colon, unspecified: Secondary | ICD-10-CM

## 2017-01-26 ENCOUNTER — Other Ambulatory Visit: Payer: Self-pay | Admitting: Nurse Practitioner

## 2017-01-26 DIAGNOSIS — C189 Malignant neoplasm of colon, unspecified: Secondary | ICD-10-CM

## 2017-01-27 ENCOUNTER — Other Ambulatory Visit: Payer: Self-pay

## 2017-01-27 ENCOUNTER — Telehealth: Payer: Self-pay

## 2017-01-27 DIAGNOSIS — C189 Malignant neoplasm of colon, unspecified: Secondary | ICD-10-CM

## 2017-01-27 MED ORDER — HYDROMORPHONE HCL 2 MG PO TABS: 4.0000 mg | ORAL_TABLET | ORAL | 0 refills | Status: DC | PRN

## 2017-01-27 NOTE — Telephone Encounter (Signed)
Call placed to patient's wife to inform her that the Rx they needed has been refilled and that they can pick it up at [facility] today or tomorrow during business hours. Pt's wife appreciative of call back.

## 2017-01-27 NOTE — Telephone Encounter (Signed)
Pt got #60 dilaudid last refill and they have 8 tablets left. They are asking for a refill.  Ivin Booty says they usually get #90. He takes 1 tab q 2 hours, rather than 2 tabs q 4 hours.  Insurance rejected oxycontin, they have enough MS contin. The pain is only present during BM, it is 9/10 to 10/10 pain at that time,  it takes about 2-4 hours for pain to ease. He has BM 2-3 times per day.   Pt said if the only recourse he has is pain med, he will probably need something stronger.   Sharon's cell [phone]

## 2017-01-27 NOTE — Telephone Encounter (Addendum)
"  Christie Rph with [facility] calling to notify provider the today's Hydromorphone prescription was not filled for 120 pills.  Eighty (80) pills was all we had in stock.  Next appointment on 02-01-2017 with Dr. Benay Spice.  He may be asking for another refill.  Want you all to know the reason."  Will notify provider.

## 2017-02-01 ENCOUNTER — Ambulatory Visit: Payer: 59

## 2017-02-01 ENCOUNTER — Telehealth: Payer: Self-pay | Admitting: Nurse Practitioner

## 2017-02-01 ENCOUNTER — Other Ambulatory Visit: Payer: Self-pay

## 2017-02-01 ENCOUNTER — Ambulatory Visit (HOSPITAL_BASED_OUTPATIENT_CLINIC_OR_DEPARTMENT_OTHER): Payer: 59 | Admitting: Oncology

## 2017-02-01 ENCOUNTER — Other Ambulatory Visit (HOSPITAL_BASED_OUTPATIENT_CLINIC_OR_DEPARTMENT_OTHER): Payer: 59

## 2017-02-01 VITALS — BP 130/86 | HR 65 | Temp 97.8°F | Resp 18 | Ht 71.0 in | Wt 210.8 lb

## 2017-02-01 DIAGNOSIS — C189 Malignant neoplasm of colon, unspecified: Secondary | ICD-10-CM

## 2017-02-01 DIAGNOSIS — C182 Malignant neoplasm of ascending colon: Secondary | ICD-10-CM

## 2017-02-01 DIAGNOSIS — Z95828 Presence of other vascular implants and grafts: Secondary | ICD-10-CM

## 2017-02-01 DIAGNOSIS — G893 Neoplasm related pain (acute) (chronic): Secondary | ICD-10-CM

## 2017-02-01 DIAGNOSIS — C787 Secondary malignant neoplasm of liver and intrahepatic bile duct: Secondary | ICD-10-CM | POA: Diagnosis not present

## 2017-02-01 LAB — CBC WITH DIFFERENTIAL/PLATELET
BASO%: 0.5 % (ref 0.0–2.0)
Basophils Absolute: 0 10*3/uL (ref 0.0–0.1)
EOS%: 2.7 % (ref 0.0–7.0)
Eosinophils Absolute: 0.1 10*3/uL (ref 0.0–0.5)
HEMATOCRIT: 32 % — AB (ref 38.4–49.9)
HGB: 10.8 g/dL — ABNORMAL LOW (ref 13.0–17.1)
LYMPH#: 0.8 10*3/uL — AB (ref 0.9–3.3)
LYMPH%: 25.1 % (ref 14.0–49.0)
MCH: 32.6 pg (ref 27.2–33.4)
MCHC: 33.8 g/dL (ref 32.0–36.0)
MCV: 96.5 fL (ref 79.3–98.0)
MONO#: 0.1 10*3/uL (ref 0.1–0.9)
MONO%: 3.9 % (ref 0.0–14.0)
NEUT#: 2.1 10*3/uL (ref 1.5–6.5)
NEUT%: 67.8 % (ref 39.0–75.0)
PLATELETS: 113 10*3/uL — AB (ref 140–400)
RBC: 3.32 10*6/uL — ABNORMAL LOW (ref 4.20–5.82)
RDW: 16.5 % — ABNORMAL HIGH (ref 11.0–14.6)
WBC: 3.1 10*3/uL — AB (ref 4.0–10.3)

## 2017-02-01 LAB — COMPREHENSIVE METABOLIC PANEL
ALT: 81 U/L — ABNORMAL HIGH (ref 0–55)
ANION GAP: 9 meq/L (ref 3–11)
AST: 76 U/L — ABNORMAL HIGH (ref 5–34)
Albumin: 3.1 g/dL — ABNORMAL LOW (ref 3.5–5.0)
Alkaline Phosphatase: 89 U/L (ref 40–150)
BUN: 16 mg/dL (ref 7.0–26.0)
CALCIUM: 9.7 mg/dL (ref 8.4–10.4)
CHLORIDE: 103 meq/L (ref 98–109)
CO2: 25 mEq/L (ref 22–29)
Creatinine: 1.3 mg/dL (ref 0.7–1.3)
EGFR: 63 mL/min/{1.73_m2} — AB (ref >90)
Glucose: 99 mg/dl (ref 70–140)
POTASSIUM: 3.7 meq/L (ref 3.5–5.1)
Sodium: 138 mEq/L (ref 136–145)
Total Bilirubin: 3.22 mg/dL — ABNORMAL HIGH (ref 0.20–1.20)
Total Protein: 7.7 g/dL (ref 6.4–8.3)

## 2017-02-01 MED ORDER — NADOLOL 80 MG PO TABS: 80.0000 mg | ORAL_TABLET | Freq: Every day | ORAL | 0 refills | Status: DC

## 2017-02-01 MED ORDER — PANTOPRAZOLE SODIUM 40 MG PO TBEC: 40.0000 mg | DELAYED_RELEASE_TABLET | Freq: Two times a day (BID) | ORAL | 0 refills | Status: DC

## 2017-02-01 MED ORDER — MORPHINE SULFATE ER 30 MG PO TBCR: 30.0000 mg | EXTENDED_RELEASE_TABLET | Freq: Two times a day (BID) | ORAL | 0 refills | Status: DC

## 2017-02-01 MED ORDER — HYDROMORPHONE HCL 4 MG PO TABS: 4.0000 mg | ORAL_TABLET | ORAL | 0 refills | Status: DC | PRN

## 2017-02-01 MED ORDER — DIAZEPAM 5 MG PO TABS: 5.0000 mg | ORAL_TABLET | Freq: Three times a day (TID) | ORAL | 0 refills | Status: DC | PRN

## 2017-02-01 MED ORDER — HEPARIN SOD (PORK) LOCK FLUSH 100 UNIT/ML IV SOLN
500.0000 [IU] | Freq: Once | INTRAVENOUS | Status: AC | PRN
  Administered 2017-02-01: 500 [IU] via INTRAVENOUS
  Filled 2017-02-01: qty 5

## 2017-02-01 MED ORDER — SODIUM CHLORIDE 0.9 % IJ SOLN
10.0000 mL | INTRAMUSCULAR | Status: DC | PRN
  Administered 2017-02-01: 10 mL via INTRAVENOUS
  Filled 2017-02-01: qty 10

## 2017-02-01 NOTE — Telephone Encounter (Signed)
Gave patient AVS and calendar for upcoming September appointments.  °

## 2017-02-01 NOTE — Progress Notes (Signed)
[facility] OFFICE PROGRESS NOTE   Diagnosis: Colon cancer  INTERVAL HISTORY:   Bryan Fields returns as scheduled. He completed a cycle of Lonsurf beginning 01/18/2017. No nausea, diarrhea, or fever. Continues to have abdominal pain. The pain is worse during a bowel movement. He has soft bowel movements several times per day. He takes MS Contin twice daily and Dilaudid every few hours.    Objective:  Vital signs in last 24 hours:  Blood pressure 130/86, pulse 65, temperature 97.8 F (36.6 C), temperature source Oral, resp. rate 18, height '5\' 11"'$  (1.803 m), weight 210 lb 12.8 oz (95.6 kg), SpO2 100 %.    HEENT: Mild whitecoat over the tongue, no buccal thrush or ulcers  Resp: Lungs clear bilaterally  Cardio: Regular rate and rhythm  GI: No hepatosplenomegaly, masslike fullness inferior to a right upper quadrant scar with associated tenderness. The abdomen is mildly distended, right upper quadrant biliary drain site with a gauze dressing  Vascular: No leg edema    Portacath/PICC-without erythema  Lab Results:  Lab Results  Component Value Date   WBC 3.1 (L) 02/01/2017   HGB 10.8 (L) 02/01/2017   HCT 32.0 (L) 02/01/2017   MCV 96.5 02/01/2017   PLT 113 (L) 02/01/2017   NEUTROABS 2.1 02/01/2017    CMP     Component Value Date/Time   NA 138 02/01/2017 0904   K 3.7 02/01/2017 0904   CL 100 (L) 01/12/2017 0500   CL 103 04/11/2012 1257   CO2 25 02/01/2017 0904   GLUCOSE 99 02/01/2017 0904   GLUCOSE 89 04/11/2012 1257   BUN 16.0 02/01/2017 0904   CREATININE 1.3 02/01/2017 0904   CALCIUM 9.7 02/01/2017 0904   PROT 7.7 02/01/2017 0904   ALBUMIN 3.1 (L) 02/01/2017 0904   AST 76 (H) 02/01/2017 0904   ALT 81 (H) 02/01/2017 0904   ALKPHOS 89 02/01/2017 0904   BILITOT 3.22 (H) 02/01/2017 0904   GFRNONAA >60 01/12/2017 0500   GFRAA >60 01/12/2017 0500    Lab Results  Component Value Date   CEA1 2.32 06/03/2016    Medications: I have reviewed the  patient's current medications.  Assessment/Plan: 1. 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 Microsatellite stable; preserved expression of major and minor MMR proteins. He began treatment with FOLFOX in December 2011. He completed cycle #12 on 10/27/2010. Oxaliplatin was held with cycles number 7 through 10 and resumed with cycle #11. 1. History of neutropenia secondary to chemotherapy. 2. History of thrombocytopenia secondary chemotherapy. 3. He did not undergo a preoperative colonoscopy. He underwent a colonoscopy on 12/19/2010 with findings of a 1 cm polyp at 90 cm from the anal verge, minimal polyp at 30 cm from the anal verge and minimal diverticulosis. Colonoscopy 03/18/2012-negative. 4. 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 He began study drug on 05/13/2010. 5. Status post Port-A-Cath placement. The Port-A-Cath has been removed. 6. 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 8. Low attenuation lesion in the caudate lobe of the liver on the CT 04/11/2012-? Cyst. MRI liver on 04/20/2012 favored the caudate lobe liver lesion to represent a cyst or complex cyst. CT abdomen/pelvis 04/17/2013 with continued enlargement of hypovascular lesion in the caudate lobe of the liver.  PET scan 10/27/2011 confirmed a hypermetabolic caudate  lobe liver lesion, tiny indeterminate lung nodules, and a 9 mm level II left neck node   CT biopsy of the liver lesion 11/07/2013 confirmed adenocarcinoma  Left liver and caudate resection 01/15/2014-pathology confirmed metastatic colon cancer, and negative surgical margins  Surveillance CT scans 07/24/2014 consistent with recurrent disease involving upper abdominal lymph nodes,  peritoneal implants, and an anterior abdominal wall mass  Status post biopsy of the anterior abdominal wall mass 08/06/2014 with pathology showing metastatic adenocarcinoma consistent with a colon primary.  [id number] 1/28  Cycle 1 FOLFIRI/Avastin on the [facility] 1317 08/20/2014  Cycle 2 FOLFIRI/Avastin 09/03/2014  Cycle 3 held on 09/17/2014 due to neutropenia  Cycle 3 FOLFIRI/Avastin 09/25/2014. Neulasta added beginning with cycle 3.  Cycle 4 FOLFIRI/Avastin 10/08/2014  Restaging CT scans 10/18/2014 with slight interval increase in the size of the soft tissue mass in the subcutaneous fat of the epigastric region. Underlying peritoneal implants, probable focus of residual disease along the resected margin of the liver and hepatoduodenal ligament lymphadenopathy all appeared slightly improved. No new sites of metastatic disease otherwise noted. New left lower lobe bronchopneumonia.  Cycle 5 FOLFIRI/Avastin 10/22/2014  Cycle 6 FOLFIRI/Avastin 11/06/2014  Cycle 7 FOLFIRI Avastin 11/19/2014  Cycle 8 FOLFIRI/Avastin 12/03/2014  CT 12/14/2014 with stable disease  Cycle 9 FOLFIRI/Avastin 12/17/2014  Cycle 10 FOLFIRI/Avastin 01/07/2015 (5-fluorouracil and leucovorin dose reduced)  Cycle 11 FOLFIRI/Avastin 01/21/2015  Cycle 12 FOLFIRI/Avastin 02/04/2015  CT 02/13/2015 with stable disease  Cycle 13 FOLFIRI/Avastin 02/25/2015-changed to an every three-week protocol, now off study  Avastin held with cycle 14 FOLFIRI on 03/25/2015 secondary to gingivitis  Restaging CTs 05/20/2015-slight increase in the size of the epigastric subcutaneous mass, stable peritoneal implants  FOLFIRI continued on a 3 week schedule  Avastin resumed 06/17/2015  CTs 12/23/2015-stable subxiphoid mass, stable low-attenuation mass adjacent to the left liver surgical margin, peritoneal lesions-stable  FOLFIRI continued on a 4 week schedule.  Avastin placed on hold 01/06/2016  FOLFIRI discontinued 03/02/2016  secondary to enlargement of the abdominal wall mass with new superficial fungating nodules involving the skin  Palliative radiation to the abdominal wall mass completed 03/20/2016  Cycle 1 FOLFOX12/27/2017  Cycle 2 CAPOX 06/22/2016  Clinical progression 07/07/2016 and allergic reaction to oxaliplatin 06/23/2015, CAPOX discontinued  Resection of the main abdominal wall mass and an adjacent peritoneal mass on 07/23/2016 with the pathology confirming metastatic colon cancer  [facility]aging CTs at [facility] 09/18/2016-new liver metastasis, progressive soft tissue masses at the liver resection margin, new mass adjacent to the chest wall resection site, increase in size of peritoneal nodules  CT abdomen/pelvis 10/02/2016-interval progression of periportal adenopathy and recurrence along the hepatic resection margin. New hepatic program lesion superior right hepatic lobe. Interval increase in size of peritoneal implants left upper quadrant. Enlargement of anabdominal wall mass along the right rectus muscle.  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 This stricture was stented with a 6cm long, uncovered 37m diameter metal bile duct stent in good position.   Initiation of Lonsurf cycle 1 11/17/2016  MRI abdomen 12/24/2016-mild to moderate intrahepatic biliary ductal dilatation; stent in the common bile duct appears grossly patent; 3 intrahepatic metastases, extensive lymphadenopathy hepatoduodenal ligament and gastrohepatic ligament, multiple peritoneal implants in the upper abdomen and soft tissue metastases in the upper anterior abdominal wall musculature and overlying subcutaneous fat on the right side.  Cycle 2 Lonsurf 01/18/2017   10. Iron deposition within the liver noted on MRI 04/20/2012. The ferritin level  was normal on 04/17/2013. Increased iron deposition noted on the left liver resection  01/15/2014 11. Pain/bleeding at the anus reported when here 04/24/2013. Status post evaluation by Dr. [name] with findings of an anal fissure. 12. Right face cystic lesion 13. Status post Port-A-Cath placement 08/14/2014 14. Delayed nausea following FOLFIRI, prophylactic Decadron added with cycle 2 with improvement. 15. Chest CT 10/18/2014 with new left lower lobe bronchopneumonia. Levaquin initiated. Resolved on CT 12/14/2014 16. Gum pain-mucositis?, Gingivitis?-Improved with discontinuation of Avastin 17. Pain/tenderness, mild erythema and full appearance over the maxillary sinuses. Maxillofacial CT 01/30/2015 with mild to moderate bilateral maxillary and sphenoid sinus inflammatory changes. Multifocal right maxillary dental periapical lucency. Status post a right upper tooth extraction with resolution of the pain. 18. Admission 10/21/2016 with nausea/vomiting-x-ray evidence for a partial small bowel obstruction, clinical improvement with bowel rest, recurrent obstructive symptoms during the week of 11/30/2016 19. Admission 12/08/2016 with a high fever-Klebsiella bacteremia confirmed, likely a biliary source  20. Mild neutropenia and anemia/thrombocytopenia secondary to chemotherapy-the platelet count and white count have recovered during this hospital admission  21. Biliary obstruction-CT 12/14/2016 revealed intrahepatic biliary dilatation despite a common bile duct stent distended gallbladder  Nuclear medicine biliary scan 12/16/2016-poor hepatic uptake with nonvisualization of the bile ducts consistent with biliary obstruction  ERCP 12/16/2016 confirmed intrahepatic biliary dilatation, status post placement of a new bile duct stent  MRI abdomen 12/24/2016-mild to moderate intrahepatic biliary ductal  dilatation; stent in the common bile duct appears grossly patent; 3 intrahepatic metastases, extensive lymphadenopathy hepatoduodenal ligament and gastrohepatic ligament, multiple peritoneal implants in the upper abdomen and soft tissue metastases in the upper anterior abdominal wall musculature and overlying subcutaneous fat on the right side.  ERCP 01/04/2017-plastic biliary stent removed, metal stent left in place, portal hypertensive changes noted  Percutaneous transhepatic cholangiogram 01/04/2017 confirmed complete occlusion of the metal stent with proximal dilation of bile ducts, status post placement of an internal/external biliary drain  22. Fever following the 01/04/2017 ERCP procedure-biliary drainage positive for Staphylococcus hemolyticus  23. Pain secondary to metastatic colon cancer. OMS Contin/Dilaudid    Disposition:  He tolerated the recent cycle of Lonsurf well. He continues to have significant abdominal pain. We adjusted the narcotic regimen today. He will increase the MS Contin to 30 mrem twice daily. He'll use Dilaudid a dose of 4-8 milligrams every 4 hours as needed. Bryan Fields is scheduled to undergo a biliary drain exchange versus placement of an internal stent on 02/09/2017. He will return for an office and lab visit on 02/11/2017. He is scheduled for cycle 3 Lonsurf 02/15/2017.  We will consider discontinuing chemotherapy and initiating a Hospice referral if his performance status and pain are not improved at the next office visit.  25 minutes were spent with the patient today. The majority of the time was used for counseling and coordination of care.     Donneta Romberg, MD  02/01/2017  11:05 AM

## 2017-02-02 ENCOUNTER — Other Ambulatory Visit: Payer: Self-pay

## 2017-02-02 DIAGNOSIS — C189 Malignant neoplasm of colon, unspecified: Secondary | ICD-10-CM

## 2017-02-02 MED ORDER — TRIFLURIDINE-TIPIRACIL 20-8.19 MG PO TABS: 35.0000 mg/m2 | ORAL_TABLET | Freq: Two times a day (BID) | ORAL | 0 refills | Status: DC

## 2017-02-04 ENCOUNTER — Other Ambulatory Visit: Payer: Self-pay | Admitting: Radiology

## 2017-02-04 ENCOUNTER — Telehealth: Payer: Self-pay

## 2017-02-04 DIAGNOSIS — C189 Malignant neoplasm of colon, unspecified: Secondary | ICD-10-CM

## 2017-02-04 MED ORDER — METOCLOPRAMIDE HCL 10 MG PO TABS: 10.0000 mg | ORAL_TABLET | Freq: Three times a day (TID) | ORAL | 0 refills | Status: AC

## 2017-02-04 NOTE — Telephone Encounter (Signed)
[facility] [address] called for reglan refill. Done.

## 2017-02-05 ENCOUNTER — Other Ambulatory Visit: Payer: Self-pay | Admitting: Radiology

## 2017-02-09 ENCOUNTER — Encounter (HOSPITAL_COMMUNITY): Payer: Self-pay

## 2017-02-09 ENCOUNTER — Ambulatory Visit (HOSPITAL_COMMUNITY)
Admission: RE | Admit: 2017-02-09 | Discharge: 2017-02-09 | Disposition: A | Discharge status: Home / Self Care | Payer: Medicare Other | Source: Ambulatory Visit | Attending: Interventional Radiology | Admitting: Interventional Radiology

## 2017-02-09 ENCOUNTER — Other Ambulatory Visit (HOSPITAL_COMMUNITY): Payer: Self-pay | Admitting: Interventional Radiology

## 2017-02-09 DIAGNOSIS — Z85038 Personal history of other malignant neoplasm of large intestine: Secondary | ICD-10-CM | POA: Diagnosis not present

## 2017-02-09 DIAGNOSIS — K219 Gastro-esophageal reflux disease without esophagitis: Secondary | ICD-10-CM | POA: Insufficient documentation

## 2017-02-09 DIAGNOSIS — C799 Secondary malignant neoplasm of unspecified site: Principal | ICD-10-CM

## 2017-02-09 DIAGNOSIS — Z4682 Encounter for fitting and adjustment of non-vascular catheter: Secondary | ICD-10-CM | POA: Insufficient documentation

## 2017-02-09 DIAGNOSIS — C189 Malignant neoplasm of colon, unspecified: Secondary | ICD-10-CM

## 2017-02-09 DIAGNOSIS — I10 Essential (primary) hypertension: Secondary | ICD-10-CM | POA: Diagnosis not present

## 2017-02-09 DIAGNOSIS — E7800 Pure hypercholesterolemia, unspecified: Secondary | ICD-10-CM | POA: Diagnosis not present

## 2017-02-09 DIAGNOSIS — N400 Enlarged prostate without lower urinary tract symptoms: Secondary | ICD-10-CM | POA: Diagnosis not present

## 2017-02-09 HISTORY — PX: IR EXCHANGE BILIARY DRAIN: IMG6046

## 2017-02-09 LAB — CBC WITH DIFFERENTIAL/PLATELET
BASOS PCT: 0 %
Basophils Absolute: 0 10*3/uL (ref 0.0–0.1)
EOS PCT: 3 %
Eosinophils Absolute: 0.1 10*3/uL (ref 0.0–0.7)
HEMATOCRIT: 29.2 % — AB (ref 39.0–52.0)
HEMOGLOBIN: 9.8 g/dL — AB (ref 13.0–17.0)
LYMPHS PCT: 34 %
Lymphs Abs: 0.6 10*3/uL — ABNORMAL LOW (ref 0.7–4.0)
MCH: 32 pg (ref 26.0–34.0)
MCHC: 33.6 g/dL (ref 30.0–36.0)
MCV: 95.4 fL (ref 78.0–100.0)
MONOS PCT: 20 %
Monocytes Absolute: 0.4 10*3/uL (ref 0.1–1.0)
NEUTROS ABS: 0.7 10*3/uL — AB (ref 1.7–7.7)
Neutrophils Relative %: 43 %
Platelets: 149 10*3/uL — ABNORMAL LOW (ref 150–400)
RBC: 3.06 MIL/uL — ABNORMAL LOW (ref 4.22–5.81)
RDW: 16 % — ABNORMAL HIGH (ref 11.5–15.5)
WBC: 1.8 10*3/uL — ABNORMAL LOW (ref 4.0–10.5)

## 2017-02-09 LAB — COMPREHENSIVE METABOLIC PANEL
ALBUMIN: 3.1 g/dL — AB (ref 3.5–5.0)
ALK PHOS: 84 U/L (ref 38–126)
ALT: 92 U/L — ABNORMAL HIGH (ref 17–63)
ANION GAP: 9 (ref 5–15)
AST: 81 U/L — ABNORMAL HIGH (ref 15–41)
BILIRUBIN TOTAL: 3.7 mg/dL — AB (ref 0.3–1.2)
BUN: 14 mg/dL (ref 6–20)
CALCIUM: 9.1 mg/dL (ref 8.9–10.3)
CO2: 26 mmol/L (ref 22–32)
Chloride: 103 mmol/L (ref 101–111)
Creatinine, Ser: 1.36 mg/dL — ABNORMAL HIGH (ref 0.61–1.24)
GFR, EST NON AFRICAN AMERICAN: 56 mL/min — AB (ref >60)
GLUCOSE: 96 mg/dL (ref 65–99)
Potassium: 3.5 mmol/L (ref 3.5–5.1)
Sodium: 138 mmol/L (ref 135–145)
TOTAL PROTEIN: 7.5 g/dL (ref 6.5–8.1)

## 2017-02-09 LAB — PROTIME-INR
INR: 1.22
PROTHROMBIN TIME: 15.3 s — AB (ref 11.4–15.2)

## 2017-02-09 MED ORDER — IOPAMIDOL (ISOVUE-300) INJECTION 61%
50.0000 mL | Freq: Once | INTRAVENOUS | Status: AC | PRN
  Administered 2017-02-09: 10 mL

## 2017-02-09 MED ORDER — FENTANYL CITRATE (PF) 100 MCG/2ML IJ SOLN
INTRAMUSCULAR | Status: AC
  Filled 2017-02-09: qty 2

## 2017-02-09 MED ORDER — PIPERACILLIN-TAZOBACTAM 3.375 G IVPB
3.3750 g | Freq: Once | INTRAVENOUS | Status: AC
  Administered 2017-02-09: 3.375 g via INTRAVENOUS

## 2017-02-09 MED ORDER — FENTANYL CITRATE (PF) 100 MCG/2ML IJ SOLN
INTRAMUSCULAR | Status: AC | PRN
  Administered 2017-02-09 (×2): 50 ug via INTRAVENOUS

## 2017-02-09 MED ORDER — HEPARIN SOD (PORK) LOCK FLUSH 100 UNIT/ML IV SOLN
500.0000 [IU] | INTRAVENOUS | Status: AC | PRN
  Administered 2017-02-09: 500 [IU]
  Filled 2017-02-09: qty 5

## 2017-02-09 MED ORDER — SODIUM CHLORIDE 0.9 % IV SOLN
INTRAVENOUS | Status: DC
  Administered 2017-02-09: 11:00:00 via INTRAVENOUS

## 2017-02-09 MED ORDER — VANCOMYCIN HCL IN DEXTROSE 1-5 GM/200ML-% IV SOLN
1000.0000 mg | INTRAVENOUS | Status: AC
  Administered 2017-02-09: 1000 mg via INTRAVENOUS
  Filled 2017-02-09: qty 200

## 2017-02-09 MED ORDER — LIDOCAINE-EPINEPHRINE (PF) 2 %-1:200000 IJ SOLN
INTRAMUSCULAR | Status: AC
  Filled 2017-02-09: qty 20

## 2017-02-09 MED ORDER — IOPAMIDOL (ISOVUE-300) INJECTION 61%
INTRAVENOUS | Status: AC
  Administered 2017-02-09: 10 mL
  Filled 2017-02-09: qty 50

## 2017-02-09 MED ORDER — PIPERACILLIN-TAZOBACTAM 3.375 G IVPB
INTRAVENOUS | Status: AC
  Administered 2017-02-09: 3.375 g via INTRAVENOUS
  Filled 2017-02-09: qty 50
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
## 2017-02-09 NOTE — Procedures (Signed)
Successful fluroscopic exchange of a right sided approach transhepatic 12 Fr biliary drainage catheter with end coiled and locked within the duodenum.  Biliary drain capped.  EBL: None  No immediate post procedural complications.  Ronny Bacon, MD Pager #: [phone]

## 2017-02-09 NOTE — Discharge Instructions (Signed)
Biliary Drainage Catheter Home Guide A biliary drainage catheter is a thin, flexible tube that is inserted through your skin into the bile ducts in your liver. Bile is a thick yellow or green fluid that helps digest fat in foods. The purpose of a biliary drainage catheter is to keep bile from backing up into your liver. Backup of bile can occur when there is a blockage that prevents bile from moving from the bile ducts into the small intestine as it should. The blockage can be caused by gallstones, a tumor, or scar tissue. There are three types of biliary drainage:  External biliary drainage. With this type, bile is only drained into a collection bag outside your body (external collection bag).  Internal-external biliary drainage. With this type, bile is drained to an external collection bag as well as into your small intestine.  Internal biliary drainage. With this type, bile is only drained into your small intestine.  General home care includes these daily actions:  Inspection of your drainage catheter.  Flushing your drainage catheter with saline.  Emptying drainage from the collection bag (if present).  Recording the amount of drainage.  Checking the catheter insertion site for signs of infection. Check for: ? Redness, swelling, or pain. ? Fluid or blood. ? Warmth. ? Pus or a bad smell. How do I inspect my drainage catheter?  Check the dressing to make sure that it is dry and clean.  Look at the skin around the drainage catheter when changing the dressing for any problems such as redness, rash, or skin breakdown.  Check the drainage bag to make sure that drainage fluid is flowing into the bag well. Note the color and amount compared to other days.  Check the drainage catheter and bag for any cracks or kinks in the tubing. How do I change my dressing? The dressing over the drainage catheter should be changed every other day, or more often if needed to keep the dressing dry. Your  health care provider will instruct you about how often to change your dressing. Supplies needed:  Mild soap and warm water.  Split gauze pads, 4 x 4 inches (10 x 10 cm) to use as a dressing sponge.  Gauze pads, 4 x 4 inches (10 x 10 cm) or adhesive dressing cover.  Paper tape. How to change the dressing: 1. Wash your hands with soap and water. 2. Gently remove the old dressing. Avoid using scissors to remove the dressing because they may damage the drainage catheter. 3. Wash the skin around the insertion site with mild soap and warm water, rinse well, then pat the area dry with a clean cloth. 4. Check the skin around the drainage catheter for redness or swelling, or for yellow or green discharge that has a bad smell. 5. If the drainage catheter was stitched (sutured) to the skin, inspect the suture to make sure it is still anchored in the skin. 6. Do not apply creams, ointments, or alcohol to the site. Allow the skin to air-dry completely before you apply a new dressing. 7. Place the drainage catheter through the slit in a dressing sponge. The dressing sponge should slide under the disk that holds the drainage catheter in place. 8. Cover the drainage catheter and the dressing sponge with a 4 x 4 inch (10 x 10 cm) gauze. The drainage catheter should rest on the gauze and not on the skin. 9. Tape the dressing to the skin. 10. You may be instructed to use an  adhesive dressing covering over the top of this in place of the gauze and tape. 11. Wash your hands with soap and water. How do I flush my drainage catheter? Biliary drainagecatheters should be flushed daily, or as often as told by your health care provider. The end of the drainage catheter is closed using an IV cap. A syringe can be directly connected to the IV cap. Supplies needed:  Alcohol swab.  10 mL prefilled normal saline syringe. How to flush the drainage catheter: 1. Wash your hands with soap and water. 2. If your drainage  catheter has a stopcock attached to it, turn the stopcock toward the drainage bag. This will allow the saline to flow in the direction of your body. 3. Clean the IV cap with an alcohol swab. 4. Screw the tip of a 10 mL normal saline syringe onto the IV cap. 5. Inject the saline over 5-10 seconds. If you feel resistance while injecting, stop immediately. Avoid  pulling back on the plunger. Doing that could increase your risk of infection. 6. Remove the syringe from the cap. Turn the stopcock so that fluid flows from your body into the drainage bag. You may notice more fluid flowing into the bag after you have completed the flush. How do I attach a bag to my drainage catheter? If you are having trouble with your internal biliary drain, you may be directed by your health care provider to use bag drainage until you can be seen to fix the problem. For this reason, you should always have a collection bag and connecting tubing at home. If you do not have these supplies, remember to ask for them at your next appointment. 1. Remove the bag and the connecting tubing from their packaging. 2. Connect the funnel end of the tubing to the bag's cone-shaped stem. 3. Remove the IV cap from the biliary drain. To do this, unscrew it and replace it with the screw-on end of the tubing. 4.   How do I empty my collection bag? Empty the collection bag whenever it becomes 2/3 full. Also empty it before you go to sleep. Most collection bags have a drainage valve at the bottom so the bag can be that allows them to be emptied easily. 1. Wash your hands with soap and water. 2. Hold the collection bag over the toilet, basin, or collection container. Use a measuring container if your health care provider told you to measure the drainage. 3. Unscrew the valve to open it, and allow the bag to drain. 4. Close the valve securely to avoid leakage. 5. Use a tissue or disposable napkin to wipe the valve clean. 6. Wash the measuring  container with soap and water. 7. Record the amount of drainage as told by your health care provider.  Contact a health care provider if:  Your pain gets worse after it had improved, and it is not relieved with pain medicines.  You have any questions about caring for your drainage catheter or collection bag.  You have any of these around your catheter insertion site or coming from it: ? Skin breakdown. ? Redness, swelling, or pain. ? Fluid or blood. ? Warmth to the touch. ? Pus or a bad smell. Get help right away if:  You have a fever or chills.  Your redness, swelling, or pain at the catheter insertion site gets worse, even though you are cleaning it well.  You have leakage of bile around the drainage catheter.  Your drainage catheter becomes  blocked or clogged.  Your drainage catheter comes out. This information is not intended to replace advice given to you by your health care provider. Make sure you discuss any questions you have with your health care provider. Document Released: 03/15/2013 Document Revised: 04/13/2016 Document Reviewed: 04/13/2016 Elsevier Interactive Patient Education  2017 [place]. Moderate Conscious Sedation, Adult, Care After These instructions provide you with information about caring for yourself after your procedure. Your health care provider may also give you more specific instructions. Your treatment has been planned according to current medical practices, but problems sometimes occur. Call your health care provider if you have any problems or questions after your procedure. What can I expect after the procedure? After your procedure, it is common:  To feel sleepy for several hours.  To feel clumsy and have poor balance for several hours.  To have poor judgment for several hours.  To vomit if you eat too soon.  Follow these instructions at home: For at least 24 hours after the procedure:   Do not: ? Participate in activities where  you could fall or become injured. ? Drive. ? Use heavy machinery. ? Drink alcohol. ? Take sleeping pills or medicines that cause drowsiness. ? Make important decisions or sign legal documents. ? Take care of children on your own.  Rest. Eating and drinking  Follow the diet recommended by your health care provider.  If you vomit: ? Drink water, juice, or soup when you can drink without vomiting. ? Make sure you have little or no nausea before eating solid foods. General instructions  Have a responsible adult stay with you until you are awake and alert.  Take over-the-counter and prescription medicines only as told by your health care provider.  If you smoke, do not smoke without supervision.  Keep all follow-up visits as told by your health care provider. This is important. Contact a health care provider if:  You keep feeling nauseous or you keep vomiting.  You feel light-headed.  You develop a rash.  You have a fever. Get help right away if:  You have trouble breathing. This information is not intended to replace advice given to you by your health care provider. Make sure you discuss any questions you have with your health care provider. Document Released: 03/15/2013 Document Revised: 10/28/2015 Document Reviewed: 09/14/2015 Elsevier Interactive Patient Education  [employer].

## 2017-02-11 ENCOUNTER — Other Ambulatory Visit: Payer: Self-pay | Admitting: *Deleted

## 2017-02-11 ENCOUNTER — Telehealth: Payer: Self-pay

## 2017-02-11 DIAGNOSIS — C189 Malignant neoplasm of colon, unspecified: Secondary | ICD-10-CM

## 2017-02-11 MED ORDER — TRIFLURIDINE-TIPIRACIL 20-8.19 MG PO TABS: 35.0000 mg/m2 | ORAL_TABLET | Freq: Two times a day (BID) | ORAL | 0 refills | Status: DC

## 2017-02-11 NOTE — Telephone Encounter (Signed)
Wife called asking about status of lonsurf rx. Noted that [facility]. Called [facility] and they said [facility] needs to [facility] to [facility] and they sent it on. Called wife and she also says it should be [facility].   Removed [facility] list.

## 2017-02-12 ENCOUNTER — Ambulatory Visit (HOSPITAL_BASED_OUTPATIENT_CLINIC_OR_DEPARTMENT_OTHER): Payer: Medicare Other | Admitting: Nurse Practitioner

## 2017-02-12 ENCOUNTER — Ambulatory Visit: Payer: Medicare Other

## 2017-02-12 ENCOUNTER — Other Ambulatory Visit (HOSPITAL_BASED_OUTPATIENT_CLINIC_OR_DEPARTMENT_OTHER): Payer: Medicare Other

## 2017-02-12 ENCOUNTER — Telehealth: Payer: Self-pay

## 2017-02-12 VITALS — BP 121/65 | HR 69 | Temp 98.7°F | Resp 18 | Ht 71.0 in | Wt 207.9 lb

## 2017-02-12 DIAGNOSIS — C787 Secondary malignant neoplasm of liver and intrahepatic bile duct: Secondary | ICD-10-CM | POA: Diagnosis not present

## 2017-02-12 DIAGNOSIS — D701 Agranulocytosis secondary to cancer chemotherapy: Secondary | ICD-10-CM | POA: Diagnosis not present

## 2017-02-12 DIAGNOSIS — C189 Malignant neoplasm of colon, unspecified: Secondary | ICD-10-CM

## 2017-02-12 DIAGNOSIS — C182 Malignant neoplasm of ascending colon: Secondary | ICD-10-CM

## 2017-02-12 DIAGNOSIS — G893 Neoplasm related pain (acute) (chronic): Secondary | ICD-10-CM

## 2017-02-12 LAB — COMPREHENSIVE METABOLIC PANEL WITH GFR
ALT: 78 U/L — ABNORMAL HIGH (ref 0–55)
AST: 65 U/L — ABNORMAL HIGH (ref 5–34)
Albumin: 3.1 g/dL — ABNORMAL LOW (ref 3.5–5.0)
Alkaline Phosphatase: 158 U/L — ABNORMAL HIGH (ref 40–150)
Anion Gap: 7 meq/L (ref 3–11)
BUN: 10.8 mg/dL (ref 7.0–26.0)
CO2: 28 meq/L (ref 22–29)
Calcium: 9.6 mg/dL (ref 8.4–10.4)
Chloride: 104 meq/L (ref 98–109)
Creatinine: 1.4 mg/dL — ABNORMAL HIGH (ref 0.7–1.3)
EGFR: 54 ml/min/1.73 m2 — ABNORMAL LOW
Glucose: 126 mg/dL (ref 70–140)
Potassium: 4.3 meq/L (ref 3.5–5.1)
Sodium: 139 meq/L (ref 136–145)
Total Bilirubin: 2.42 mg/dL — ABNORMAL HIGH (ref 0.20–1.20)
Total Protein: 8 g/dL (ref 6.4–8.3)

## 2017-02-12 LAB — CBC WITH DIFFERENTIAL/PLATELET
BASO%: 0.5 % (ref 0.0–2.0)
BASOS ABS: 0 10*3/uL (ref 0.0–0.1)
EOS%: 3.5 % (ref 0.0–7.0)
Eosinophils Absolute: 0.1 10*3/uL (ref 0.0–0.5)
HEMATOCRIT: 33.2 % — AB (ref 38.4–49.9)
HGB: 10.9 g/dL — ABNORMAL LOW (ref 13.0–17.1)
LYMPH#: 0.7 10*3/uL — AB (ref 0.9–3.3)
LYMPH%: 36.6 % (ref 14.0–49.0)
MCH: 32.8 pg (ref 27.2–33.4)
MCHC: 32.8 g/dL (ref 32.0–36.0)
MCV: 100 fL — ABNORMAL HIGH (ref 79.3–98.0)
MONO#: 0.4 10*3/uL (ref 0.1–0.9)
MONO%: 18.3 % — ABNORMAL HIGH (ref 0.0–14.0)
NEUT#: 0.8 10*3/uL — ABNORMAL LOW (ref 1.5–6.5)
NEUT%: 41.1 % (ref 39.0–75.0)
Platelets: 189 10*3/uL (ref 140–400)
RBC: 3.32 10*6/uL — AB (ref 4.20–5.82)
RDW: 16.4 % — ABNORMAL HIGH (ref 11.0–14.6)
WBC: 2 10*3/uL — ABNORMAL LOW (ref 4.0–10.3)

## 2017-02-12 LAB — CEA (IN HOUSE-CHCC): CEA (CHCC-IN HOUSE): 4.07 ng/mL (ref 0.00–5.00)

## 2017-02-12 MED ORDER — SODIUM CHLORIDE 0.9 % IJ SOLN
10.0000 mL | INTRAMUSCULAR | Status: DC | PRN
  Filled 2017-02-12: qty 10

## 2017-02-12 MED ORDER — HEPARIN SOD (PORK) LOCK FLUSH 100 UNIT/ML IV SOLN
500.0000 [IU] | Freq: Once | INTRAVENOUS | Status: DC | PRN
  Filled 2017-02-12: qty 5

## 2017-02-12 MED ORDER — HYDROMORPHONE HCL 4 MG PO TABS: 4.0000 mg | ORAL_TABLET | ORAL | 0 refills | Status: DC | PRN

## 2017-02-12 NOTE — Telephone Encounter (Signed)
Gave patient avs and calender for September. Per 9/7 los

## 2017-02-12 NOTE — Progress Notes (Addendum)
[facility] OFFICE PROGRESS NOTE   Diagnosis:  Colon cancer  INTERVAL HISTORY:   [name] returns as scheduled. Abdominal pain is well-controlled with a combination of MS Contin 30 mg every 12 hours and Dilaudid 4 mg about every 4 hours. He has occasional nausea. No constipation. His wife notes that he is "sleeping more".  Objective:  Vital signs in last 24 hours:  Blood pressure 121/65, pulse 69, temperature 98.7 F (37.1 C), temperature source Oral, resp. rate 18, height 5\' 11"  (1.803 m), weight 207 lb 14.4 oz (94.3 kg), SpO2 99 %.    HEENT: Mild scleral icterus. Resp: Lungs clear bilaterally. Cardio: Regular rate and rhythm. GI: No hepatomegaly. Masslike fullness inferior to right upper quadrant scar. Abdomen is distended. Right upper quadrant biliary drain site is covered with a gauze dressing. Vascular: No leg edema.  Port-A-Cath without erythema.  Lab Results:  Lab Results  Component Value Date   WBC 2.0 (L) 02/12/2017   HGB 10.9 (L) 02/12/2017   HCT 33.2 (L) 02/12/2017   MCV 100.0 (H) 02/12/2017   PLT 189 02/12/2017   NEUTROABS 0.8 (L) 02/12/2017    Imaging:  No results found.  Medications: I have reviewed the patient's current medications.  Assessment/Plan: 1. 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 Microsatellite stable; preserved expression of major and minor MMR proteins. He began treatment with FOLFOX in December 2011. He completed cycle #12 on 10/27/2010. Oxaliplatin was held with cycles number 7 through 10 and resumed with cycle #11. 1. History of neutropenia secondary to chemotherapy. 2. History of thrombocytopenia secondary chemotherapy. 3. He did not undergo a preoperative colonoscopy. He underwent a colonoscopy on 12/19/2010 with findings of a 1 cm polyp at 90 cm from the anal verge, minimal polyp at 30 cm from the anal  verge and minimal diverticulosis. Colonoscopy 03/18/2012-negative. 4. 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 He began study drug on 05/13/2010. 5. Status post Port-A-Cath placement. The Port-A-Cath has been removed. 6. 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 8. Low attenuation lesion in the caudate lobe of the liver on the CT 04/11/2012-? Cyst. MRI liver on 04/20/2012 favored the caudate lobe liver lesion to represent a cyst or complex cyst. CT abdomen/pelvis 04/17/2013 with continued enlargement of hypovascular lesion in the caudate lobe of the liver.  PET scan 10/27/2011 confirmed a hypermetabolic caudate lobe liver lesion, tiny indeterminate lung nodules, and a 9 mm level II left neck node   CT biopsy of the liver lesion 11/07/2013 confirmed adenocarcinoma  Left liver and caudate resection 01/15/2014-pathology confirmed metastatic colon cancer, and negative surgical margins  Surveillance CT scans 07/24/2014 consistent with recurrent disease involving upper abdominal lymph nodes, peritoneal implants, and an anterior abdominal wall mass  Status post biopsy of the anterior abdominal wall mass 08/06/2014 with pathology showing metastatic adenocarcinoma consistent with a colon primary.  UGT1A1 1/28  Cycle 1 FOLFIRI/Avastin on the [facility] 1317 08/20/2014  Cycle 2 FOLFIRI/Avastin 09/03/2014  Cycle 3 held on 09/17/2014 due to neutropenia  Cycle 3 FOLFIRI/Avastin 09/25/2014. Neulasta added beginning with cycle 3.  Cycle 4 FOLFIRI/Avastin 10/08/2014  Restaging CT scans 10/18/2014 with slight interval increase in the size of the soft tissue mass in the subcutaneous fat of the epigastric region. Underlying peritoneal implants, probable focus  of residual disease along the resected margin of the liver and hepatoduodenal  ligament lymphadenopathy all appeared slightly improved. No new sites of metastatic disease otherwise noted. New left lower lobe bronchopneumonia.  Cycle 5 FOLFIRI/Avastin 10/22/2014  Cycle 6 FOLFIRI/Avastin 11/06/2014  Cycle 7 FOLFIRI Avastin 11/19/2014  Cycle 8 FOLFIRI/Avastin 12/03/2014  CT 12/14/2014 with stable disease  Cycle 9 FOLFIRI/Avastin 12/17/2014  Cycle 10 FOLFIRI/Avastin 01/07/2015 (5-fluorouracil and leucovorin dose reduced)  Cycle 11 FOLFIRI/Avastin 01/21/2015  Cycle 12 FOLFIRI/Avastin 02/04/2015  CT 02/13/2015 with stable disease  Cycle 13 FOLFIRI/Avastin 02/25/2015-changed to an every three-week protocol, now off study  Avastin held with cycle 14 FOLFIRI on 03/25/2015 secondary to gingivitis  Restaging CTs 05/20/2015-slight increase in the size of the epigastric subcutaneous mass, stable peritoneal implants  FOLFIRI continued on a 3 week schedule  Avastin resumed 06/17/2015  CTs 12/23/2015-stable subxiphoid mass, stable low-attenuation mass adjacent to the left liver surgical margin, peritoneal lesions-stable  FOLFIRI continued on a 4 week schedule.  Avastin placed on hold 01/06/2016  FOLFIRI discontinued 03/02/2016 secondary to enlargement of the abdominal wall mass with new superficial fungating nodules involving the skin  Palliative radiation to the abdominal wall mass completed 03/20/2016  Cycle 1 FOLFOX12/27/2017  Cycle 2 CAPOX 06/22/2016  Clinical progression 07/07/2016 and allergic reaction to oxaliplatin 06/23/2015, CAPOX discontinued  Resection of the main abdominal wall mass and an adjacent peritoneal mass on 07/23/2016 with the pathology confirming metastatic colon cancer  [facility] at [facility] 09/18/2016-new liver metastasis, progressive soft tissue masses at the liver resection margin, new mass adjacent to the chest wall resection site, increase in size of peritoneal nodules  CT abdomen/pelvis 10/02/2016-interval progression of  periportal adenopathy and recurrence along the hepatic resection margin. New hepatic program lesion superior right hepatic lobe. Interval increase in size of peritoneal implants left upper quadrant. Enlargement of anabdominal wall mass along the right rectus muscle.  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 This stricture was stented with a 6cm long, uncovered 88m diameter metal bile duct stent in good position.   Initiation of Lonsurf cycle 1 11/17/2016  MRI abdomen 12/24/2016-mild to moderate intrahepatic biliary ductal dilatation; stent in the common bile duct appears grossly patent; 3 intrahepatic metastases, extensive lymphadenopathy hepatoduodenal ligament and gastrohepatic ligament, multiple peritoneal implants in the upper abdomen and soft tissue metastases in the upper anterior abdominal wall musculature and overlying subcutaneous fat on the right side.  Cycle 2 Lonsurf 01/18/2017   10. Iron deposition within the liver noted on MRI 04/20/2012. The ferritin level was normal on 04/17/2013. Increased iron deposition noted on the left liver resection 01/15/2014 11. Pain/bleeding at the anus reported when here 04/24/2013. Status post evaluation by Dr. NLucia Gaskinswith findings of an anal fissure. 12. Right face cystic lesion 13. Status post Port-A-Cath placement 08/14/2014 14. Delayed nausea following FOLFIRI, prophylactic Decadron added with cycle 2 with improvement. 15. Chest CT 10/18/2014 with new left lower lobe bronchopneumonia. Levaquin initiated. Resolved on CT 12/14/2014 16. Gum pain-mucositis?, Gingivitis?-Improved with discontinuation of Avastin 17. Pain/tenderness, mild erythema and full appearance over the maxillary sinuses. Maxillofacial CT 01/30/2015 with mild to moderate bilateral maxillary and sphenoid sinus inflammatory changes. Multifocal right maxillary dental periapical lucency. Status  post a right upper tooth extraction with resolution of the pain. 18. Admission 10/21/2016 with nausea/vomiting-x-ray evidence for a partial small bowel obstruction, clinical improvement with bowel rest, recurrent obstructive symptoms during the week of 11/30/2016 19. Admission 12/08/2016 with a  high fever-Klebsiella bacteremia confirmed, likely a biliary source  20. Mild neutropenia and anemia/thrombocytopenia secondary to chemotherapy-the platelet count and white count have recovered during this hospital admission  21. Biliary obstruction-CT 12/14/2016 revealed intrahepatic biliary dilatation despite a common bile duct stent distended gallbladder  Nuclear medicine biliary scan 12/16/2016-poor hepatic uptake with nonvisualization of the bile ducts consistent with biliary obstruction  ERCP 12/16/2016 confirmed intrahepatic biliary dilatation, status post placement of a new bile duct stent  MRI abdomen 12/24/2016-mild to moderate intrahepatic biliary ductal dilatation; stent in the common bile duct appears grossly patent; 3 intrahepatic metastases, extensive lymphadenopathy hepatoduodenal ligament and gastrohepatic ligament, multiple peritoneal implants in the upper abdomen and soft tissue metastases in the upper anterior abdominal wall musculature and overlying subcutaneous fat on the right side.  ERCP 01/04/2017-plastic biliary stent removed, metal stent left in place, portal hypertensive changes noted  Percutaneous transhepatic cholangiogram 01/04/2017 confirmed complete occlusion of the metal stent with proximal dilation of bile ducts, status post placement of an internal/external biliary drain  02/09/2017-biliary drain exchanged and capped for a trial of internalization.  22. Fever following the  01/04/2017 ERCP procedure-biliary drainage positive for Staphylococcus hemolyticus  23. Pain secondary to metastatic colon cancer.MS Contin/Dilaudid   Disposition: Mr. Kitson appears unchanged. He has completed 2 cycles of  Lonsurf. We reviewed today's labs. He is neutropenic and the bilirubin remained elevated though improved since the biliary drain was exchanged. He understands he should not begin the third cycle of Lonsurf based on the current neutrophil count and bilirubin level. He will return for a follow-up CBC and chemistry panel in one week. We will contact him with instructions regarding Lonsurf pending those labs.  Pain seems to be well-controlled with MS Contin and Dilaudid. He will continue the same.  He will return for labs and a follow-up visit in 3 weeks. We are available to see him sooner if needed.  Patient seen with Dr. Benay Spice. 25 minutes were spent face-to-face at today's visit with the majority of that time involved in counseling/coordination of care.   Ned Card ANP/GNP-BC   02/12/2017  12:18 PM  This was a shared visit with Ned Card. Mr. Hawkes appears unchanged. Cycle 3 Lonsurf will be held secondary to neutropenia and persistent hyperbilirubinemia. He will return for a lab visit next week with the plan to begin cycle 3 if the neutrophil count has recovered and the bilirubin is lower.  Julieanne Manson, M.D.

## 2017-02-15 ENCOUNTER — Telehealth: Payer: Self-pay

## 2017-02-15 NOTE — Telephone Encounter (Signed)
Call placed back to pt at his request. Pt's wife states that they were calling to verify everything is in order with the pt's Lonsurf rx. This RN informed pt that everything with [facility] says that the rx was sent to the correct pharmacy and they received the request on 02/11/17. Informed pt's wife that a F/U call would be made. Pt's wife verbalizes understanding and is appreciative of call back.

## 2017-02-16 ENCOUNTER — Telehealth: Payer: Self-pay | Admitting: Emergency Medicine

## 2017-02-16 NOTE — Telephone Encounter (Signed)
[place] to verify oral chemo med. Medication is in pharmacy processing at this time . Verified patients phone numbers with rx company.

## 2017-02-19 ENCOUNTER — Other Ambulatory Visit (HOSPITAL_BASED_OUTPATIENT_CLINIC_OR_DEPARTMENT_OTHER): Payer: Medicare Other

## 2017-02-19 ENCOUNTER — Telehealth: Payer: Self-pay | Admitting: *Deleted

## 2017-02-19 DIAGNOSIS — C189 Malignant neoplasm of colon, unspecified: Secondary | ICD-10-CM

## 2017-02-19 DIAGNOSIS — C182 Malignant neoplasm of ascending colon: Secondary | ICD-10-CM

## 2017-02-19 LAB — CBC WITH DIFFERENTIAL/PLATELET
BASO%: 0.7 % (ref 0.0–2.0)
BASOS ABS: 0 10*3/uL (ref 0.0–0.1)
EOS ABS: 0 10*3/uL (ref 0.0–0.5)
EOS%: 1.7 % (ref 0.0–7.0)
HCT: 32.3 % — ABNORMAL LOW (ref 38.4–49.9)
HEMOGLOBIN: 10.7 g/dL — AB (ref 13.0–17.1)
LYMPH#: 0.9 10*3/uL (ref 0.9–3.3)
LYMPH%: 37 % (ref 14.0–49.0)
MCH: 33.3 pg (ref 27.2–33.4)
MCHC: 33.2 g/dL (ref 32.0–36.0)
MCV: 100.1 fL — AB (ref 79.3–98.0)
MONO#: 0.3 10*3/uL (ref 0.1–0.9)
MONO%: 14.1 % — ABNORMAL HIGH (ref 0.0–14.0)
NEUT%: 46.5 % (ref 39.0–75.0)
NEUTROS ABS: 1.1 10*3/uL — AB (ref 1.5–6.5)
PLATELETS: 158 10*3/uL (ref 140–400)
RBC: 3.23 10*6/uL — ABNORMAL LOW (ref 4.20–5.82)
RDW: 17.3 % — AB (ref 11.0–14.6)
WBC: 2.3 10*3/uL — ABNORMAL LOW (ref 4.0–10.3)

## 2017-02-19 LAB — COMPREHENSIVE METABOLIC PANEL
ALBUMIN: 2.9 g/dL — AB (ref 3.5–5.0)
ALT: 29 U/L (ref 0–55)
ANION GAP: 7 meq/L (ref 3–11)
AST: 34 U/L (ref 5–34)
Alkaline Phosphatase: 126 U/L (ref 40–150)
BUN: 10.4 mg/dL (ref 7.0–26.0)
CALCIUM: 9.4 mg/dL (ref 8.4–10.4)
CO2: 27 mEq/L (ref 22–29)
Chloride: 106 mEq/L (ref 98–109)
Creatinine: 1.3 mg/dL (ref 0.7–1.3)
EGFR: 59 mL/min/{1.73_m2} — AB (ref >90)
Glucose: 116 mg/dl (ref 70–140)
POTASSIUM: 4 meq/L (ref 3.5–5.1)
SODIUM: 140 meq/L (ref 136–145)
TOTAL PROTEIN: 7.6 g/dL (ref 6.4–8.3)
Total Bilirubin: 1.38 mg/dL — ABNORMAL HIGH (ref 0.20–1.20)

## 2017-02-19 NOTE — Telephone Encounter (Signed)
Called pt's wife, instructed her to hold Lonsurf. Will recheck lab on 9/19. He is tearful and emotional most of the time. Wife is requesting an anti-anxiety medication. She reports he is eating better and seems a little stronger but is just emotional. Wife has been giving half tablet of Valium which seems to help. They have stopped the morphine due to hallucinations and odd behavior. Dilaudid seems to be managing pain.  Reviewed above with Dr. Benay Spice: Continue Valium for anxiety 0.5-1 tablet Q8 hours.  Wife voiced understanding.

## 2017-02-19 NOTE — Telephone Encounter (Signed)
-----   Message from Ladell Pier, MD sent at 02/19/2017  3:49 PM EDT ----- Please call patient, bilirubin is better,but white count is still low, hold Lonsurf, repeat cbc 9/19, if white count is higher plan to proceed with next cycle with dose reduction

## 2017-02-22 ENCOUNTER — Telehealth: Payer: Self-pay

## 2017-02-22 NOTE — Telephone Encounter (Signed)
Scheduled patient lab appointment for 9/19 12pm. Per 9/14 message los.

## 2017-02-24 ENCOUNTER — Telehealth: Payer: Self-pay | Admitting: Emergency Medicine

## 2017-02-24 ENCOUNTER — Encounter: Payer: Self-pay | Admitting: *Deleted

## 2017-02-24 ENCOUNTER — Other Ambulatory Visit (HOSPITAL_BASED_OUTPATIENT_CLINIC_OR_DEPARTMENT_OTHER): Payer: Medicare Other

## 2017-02-24 DIAGNOSIS — C787 Secondary malignant neoplasm of liver and intrahepatic bile duct: Secondary | ICD-10-CM

## 2017-02-24 DIAGNOSIS — C182 Malignant neoplasm of ascending colon: Secondary | ICD-10-CM

## 2017-02-24 DIAGNOSIS — C189 Malignant neoplasm of colon, unspecified: Secondary | ICD-10-CM

## 2017-02-24 LAB — CBC WITH DIFFERENTIAL/PLATELET
BASO%: 0.2 % (ref 0.0–2.0)
Basophils Absolute: 0 10*3/uL (ref 0.0–0.1)
EOS ABS: 0 10*3/uL (ref 0.0–0.5)
EOS%: 0.7 % (ref 0.0–7.0)
HEMATOCRIT: 33.6 % — AB (ref 38.4–49.9)
HEMOGLOBIN: 11 g/dL — AB (ref 13.0–17.1)
LYMPH#: 0.8 10*3/uL — AB (ref 0.9–3.3)
LYMPH%: 20.7 % (ref 14.0–49.0)
MCH: 32.9 pg (ref 27.2–33.4)
MCHC: 32.7 g/dL (ref 32.0–36.0)
MCV: 100.6 fL — AB (ref 79.3–98.0)
MONO#: 0.5 10*3/uL (ref 0.1–0.9)
MONO%: 13.1 % (ref 0.0–14.0)
NEUT%: 65.3 % (ref 39.0–75.0)
NEUTROS ABS: 2.6 10*3/uL (ref 1.5–6.5)
PLATELETS: 118 10*3/uL — AB (ref 140–400)
RBC: 3.34 10*6/uL — AB (ref 4.20–5.82)
RDW: 16.3 % — AB (ref 11.0–14.6)
WBC: 4.1 10*3/uL (ref 4.0–10.3)

## 2017-02-24 NOTE — Progress Notes (Signed)
[facility]  Clinical Social Work was referred by patient to review and complete healthcare advance directives.  Clinical Social Worker met with patient and patients wife in [place] office.  The patient designated [name] as their primary healthcare.  Patient also completed healthcare living will.    Clinical Social Worker notarized documents and made copies for patient/family. Clinical Social Worker will send documents to medical records to be scanned into patient's chart. Clinical Social Worker encouraged patient/family to contact with any additional questions or concerns.   [name], MSW, LCSW, OSW-C Clinical Social Worker [facility] [phone]

## 2017-02-24 NOTE — Telephone Encounter (Addendum)
Pt wife verbalized nderstanding of reducing lonsurf dose to 3 tabs/ 2xdaily  ----- Message from Ladell Pier, MD sent at 02/24/2017 12:57 PM EDT ----- Please call patient, white count is better , begin next cycle of Lonsurf-reduce dose to 3 tablets twice daily

## 2017-03-05 ENCOUNTER — Ambulatory Visit: Payer: Medicare Other

## 2017-03-05 ENCOUNTER — Ambulatory Visit (HOSPITAL_BASED_OUTPATIENT_CLINIC_OR_DEPARTMENT_OTHER): Payer: Medicare Other | Admitting: Nurse Practitioner

## 2017-03-05 ENCOUNTER — Emergency Department (HOSPITAL_COMMUNITY)
Admission: EM | Admit: 2017-03-05 | Discharge: 2017-03-05 | Disposition: A | Discharge status: Home / Self Care | Payer: Medicare Other | Source: Home / Self Care | Attending: Emergency Medicine | Admitting: Emergency Medicine

## 2017-03-05 ENCOUNTER — Other Ambulatory Visit: Payer: Medicare Other

## 2017-03-05 ENCOUNTER — Emergency Department (HOSPITAL_COMMUNITY): Payer: Medicare Other

## 2017-03-05 ENCOUNTER — Encounter (HOSPITAL_COMMUNITY): Payer: Self-pay

## 2017-03-05 ENCOUNTER — Encounter: Payer: Self-pay | Admitting: Nurse Practitioner

## 2017-03-05 ENCOUNTER — Encounter (HOSPITAL_COMMUNITY): Payer: Self-pay | Admitting: Emergency Medicine

## 2017-03-05 ENCOUNTER — Inpatient Hospital Stay (HOSPITAL_COMMUNITY)
Admission: AD | Admit: 2017-03-05 | Discharge: 2017-03-08 | DRG: 444 | Disposition: A | Discharge status: Home / Self Care | Payer: Medicare Other | Source: Ambulatory Visit | Attending: Internal Medicine | Admitting: Internal Medicine

## 2017-03-05 VITALS — BP 133/67 | HR 84 | Temp 101.6°F | Resp 20 | Ht 71.0 in | Wt 208.5 lb

## 2017-03-05 DIAGNOSIS — D709 Neutropenia, unspecified: Secondary | ICD-10-CM | POA: Diagnosis not present

## 2017-03-05 DIAGNOSIS — R509 Fever, unspecified: Secondary | ICD-10-CM | POA: Diagnosis present

## 2017-03-05 DIAGNOSIS — E7800 Pure hypercholesterolemia, unspecified: Secondary | ICD-10-CM | POA: Diagnosis present

## 2017-03-05 DIAGNOSIS — Z87891 Personal history of nicotine dependence: Secondary | ICD-10-CM | POA: Diagnosis not present

## 2017-03-05 DIAGNOSIS — K219 Gastro-esophageal reflux disease without esophagitis: Secondary | ICD-10-CM | POA: Diagnosis present

## 2017-03-05 DIAGNOSIS — T451X5A Adverse effect of antineoplastic and immunosuppressive drugs, initial encounter: Secondary | ICD-10-CM | POA: Diagnosis present

## 2017-03-05 DIAGNOSIS — K8309 Other cholangitis: Secondary | ICD-10-CM | POA: Diagnosis present

## 2017-03-05 DIAGNOSIS — K830 Cholangitis: Secondary | ICD-10-CM

## 2017-03-05 DIAGNOSIS — R110 Nausea: Secondary | ICD-10-CM

## 2017-03-05 DIAGNOSIS — Z79899 Other long term (current) drug therapy: Secondary | ICD-10-CM | POA: Diagnosis not present

## 2017-03-05 DIAGNOSIS — G620 Drug-induced polyneuropathy: Secondary | ICD-10-CM | POA: Diagnosis present

## 2017-03-05 DIAGNOSIS — C182 Malignant neoplasm of ascending colon: Secondary | ICD-10-CM

## 2017-03-05 DIAGNOSIS — C787 Secondary malignant neoplasm of liver and intrahepatic bile duct: Secondary | ICD-10-CM

## 2017-03-05 DIAGNOSIS — M62838 Other muscle spasm: Secondary | ICD-10-CM | POA: Diagnosis present

## 2017-03-05 DIAGNOSIS — Z79891 Long term (current) use of opiate analgesic: Secondary | ICD-10-CM | POA: Diagnosis not present

## 2017-03-05 DIAGNOSIS — Z66 Do not resuscitate: Secondary | ICD-10-CM | POA: Diagnosis present

## 2017-03-05 DIAGNOSIS — D61810 Antineoplastic chemotherapy induced pancytopenia: Secondary | ICD-10-CM | POA: Diagnosis present

## 2017-03-05 DIAGNOSIS — Z9049 Acquired absence of other specified parts of digestive tract: Secondary | ICD-10-CM

## 2017-03-05 DIAGNOSIS — C189 Malignant neoplasm of colon, unspecified: Secondary | ICD-10-CM | POA: Diagnosis present

## 2017-03-05 DIAGNOSIS — N400 Enlarged prostate without lower urinary tract symptoms: Secondary | ICD-10-CM | POA: Diagnosis present

## 2017-03-05 DIAGNOSIS — D6959 Other secondary thrombocytopenia: Secondary | ICD-10-CM | POA: Diagnosis present

## 2017-03-05 DIAGNOSIS — Z95828 Presence of other vascular implants and grafts: Secondary | ICD-10-CM

## 2017-03-05 DIAGNOSIS — F329 Major depressive disorder, single episode, unspecified: Secondary | ICD-10-CM | POA: Diagnosis not present

## 2017-03-05 DIAGNOSIS — I10 Essential (primary) hypertension: Secondary | ICD-10-CM | POA: Diagnosis present

## 2017-03-05 DIAGNOSIS — G893 Neoplasm related pain (acute) (chronic): Secondary | ICD-10-CM | POA: Diagnosis present

## 2017-03-05 DIAGNOSIS — K831 Obstruction of bile duct: Secondary | ICD-10-CM | POA: Diagnosis present

## 2017-03-05 DIAGNOSIS — R222 Localized swelling, mass and lump, trunk: Secondary | ICD-10-CM

## 2017-03-05 DIAGNOSIS — R7881 Bacteremia: Secondary | ICD-10-CM | POA: Diagnosis present

## 2017-03-05 DIAGNOSIS — D61818 Other pancytopenia: Secondary | ICD-10-CM | POA: Diagnosis present

## 2017-03-05 DIAGNOSIS — D696 Thrombocytopenia, unspecified: Secondary | ICD-10-CM | POA: Diagnosis not present

## 2017-03-05 LAB — CBC WITH DIFFERENTIAL/PLATELET
BASOS ABS: 0 10*3/uL (ref 0.0–0.1)
BASOS PCT: 0 %
EOS ABS: 0 10*3/uL (ref 0.0–0.7)
EOS PCT: 1 %
HCT: 33.9 % — ABNORMAL LOW (ref 39.0–52.0)
Hemoglobin: 11.3 g/dL — ABNORMAL LOW (ref 13.0–17.0)
Lymphocytes Relative: 9 %
Lymphs Abs: 0.8 10*3/uL (ref 0.7–4.0)
MCH: 33 pg (ref 26.0–34.0)
MCHC: 33.3 g/dL (ref 30.0–36.0)
MCV: 99.1 fL (ref 78.0–100.0)
Monocytes Absolute: 0.6 10*3/uL (ref 0.1–1.0)
Monocytes Relative: 6 %
Neutro Abs: 7.3 10*3/uL (ref 1.7–7.7)
Neutrophils Relative %: 84 %
PLATELETS: 156 10*3/uL (ref 150–400)
RBC: 3.42 MIL/uL — AB (ref 4.22–5.81)
RDW: 15.4 % (ref 11.5–15.5)
WBC: 8.6 10*3/uL (ref 4.0–10.5)

## 2017-03-05 LAB — COMPREHENSIVE METABOLIC PANEL
ALBUMIN: 2.8 g/dL — AB (ref 3.5–5.0)
ALT: 23 U/L (ref 17–63)
ALT: 20 U/L (ref 17–63)
ANION GAP: 6 (ref 5–15)
AST: 34 U/L (ref 15–41)
AST: 26 U/L (ref 15–41)
Albumin: 3.3 g/dL — ABNORMAL LOW (ref 3.5–5.0)
Alkaline Phosphatase: 160 U/L — ABNORMAL HIGH (ref 38–126)
Alkaline Phosphatase: 114 U/L (ref 38–126)
Anion gap: 9 (ref 5–15)
BILIRUBIN TOTAL: 2.3 mg/dL — AB (ref 0.3–1.2)
BUN: 14 mg/dL (ref 6–20)
BUN: 15 mg/dL (ref 6–20)
CALCIUM: 8.6 mg/dL — AB (ref 8.9–10.3)
CHLORIDE: 106 mmol/L (ref 101–111)
CO2: 21 mmol/L — ABNORMAL LOW (ref 22–32)
CO2: 22 mmol/L (ref 22–32)
CREATININE: 1.33 mg/dL — AB (ref 0.61–1.24)
Calcium: 8 mg/dL — ABNORMAL LOW (ref 8.9–10.3)
Chloride: 102 mmol/L (ref 101–111)
Creatinine, Ser: 1.12 mg/dL (ref 0.61–1.24)
GFR calc Af Amer: >60 mL/min (ref >60)
GFR calc non Af Amer: >60 mL/min (ref >60)
GFR, EST NON AFRICAN AMERICAN: 57 mL/min — AB (ref >60)
GLUCOSE: 108 mg/dL — AB (ref 65–99)
Glucose, Bld: 125 mg/dL — ABNORMAL HIGH (ref 65–99)
POTASSIUM: 3.6 mmol/L (ref 3.5–5.1)
Potassium: 3.5 mmol/L (ref 3.5–5.1)
SODIUM: 134 mmol/L — AB (ref 135–145)
Sodium: 132 mmol/L — ABNORMAL LOW (ref 135–145)
TOTAL PROTEIN: 7.9 g/dL (ref 6.5–8.1)
Total Bilirubin: 2.2 mg/dL — ABNORMAL HIGH (ref 0.3–1.2)
Total Protein: 6.9 g/dL (ref 6.5–8.1)

## 2017-03-05 LAB — URINALYSIS, ROUTINE W REFLEX MICROSCOPIC
BILIRUBIN URINE: NEGATIVE
Glucose, UA: NEGATIVE mg/dL
Hgb urine dipstick: NEGATIVE
KETONES UR: NEGATIVE mg/dL
Leukocytes, UA: NEGATIVE
NITRITE: NEGATIVE
PH: 6 (ref 5.0–8.0)
PROTEIN: NEGATIVE mg/dL
Specific Gravity, Urine: 1.016 (ref 1.005–1.030)

## 2017-03-05 LAB — PROTIME-INR
INR: 1.18
PROTHROMBIN TIME: 14.9 s (ref 11.4–15.2)

## 2017-03-05 LAB — MAGNESIUM: Magnesium: 1.6 mg/dL — ABNORMAL LOW (ref 1.7–2.4)

## 2017-03-05 LAB — CG4 I-STAT (LACTIC ACID): Lactic Acid, Venous: 1.14 mmol/L (ref 0.5–1.9)

## 2017-03-05 MED ORDER — SODIUM CHLORIDE 0.9 % IV SOLN
INTRAVENOUS | Status: DC
  Administered 2017-03-05 – 2017-03-08 (×4): via INTRAVENOUS

## 2017-03-05 MED ORDER — HYDROMORPHONE HCL 2 MG/ML IJ SOLN
2.0000 mg | Freq: Once | INTRAMUSCULAR | Status: AC
  Administered 2017-03-05: 2 mg via INTRAVENOUS

## 2017-03-05 MED ORDER — ACETAMINOPHEN 325 MG PO TABS
ORAL_TABLET | ORAL | Status: AC
  Filled 2017-03-05: qty 2

## 2017-03-05 MED ORDER — SENNOSIDES-DOCUSATE SODIUM 8.6-50 MG PO TABS: 1.0000 | ORAL_TABLET | Freq: Every evening | ORAL | Status: DC | PRN

## 2017-03-05 MED ORDER — SODIUM CHLORIDE 0.9 % IV SOLN
INTRAVENOUS | Status: AC
  Administered 2017-03-05: 12:00:00 via INTRAVENOUS

## 2017-03-05 MED ORDER — ACETAMINOPHEN 650 MG RE SUPP: 650.0000 mg | Freq: Four times a day (QID) | RECTAL | Status: DC | PRN

## 2017-03-05 MED ORDER — PIPERACILLIN-TAZOBACTAM 3.375 G IVPB 30 MIN
3.3750 g | Freq: Once | INTRAVENOUS | Status: AC
  Administered 2017-03-05: 3.375 g via INTRAVENOUS
  Filled 2017-03-05: qty 50

## 2017-03-05 MED ORDER — ONDANSETRON HCL 4 MG PO TABS
4.0000 mg | ORAL_TABLET | Freq: Four times a day (QID) | ORAL | Status: DC | PRN
  Filled 2017-03-05: qty 1

## 2017-03-05 MED ORDER — VANCOMYCIN HCL IN DEXTROSE 1-5 GM/200ML-% IV SOLN
1000.0000 mg | Freq: Two times a day (BID) | INTRAVENOUS | Status: DC
  Administered 2017-03-05 – 2017-03-07 (×4): 1000 mg via INTRAVENOUS
  Filled 2017-03-05 (×4): qty 200

## 2017-03-05 MED ORDER — PIPERACILLIN-TAZOBACTAM 3.375 G IVPB
3.3750 g | Freq: Three times a day (TID) | INTRAVENOUS | Status: DC
  Administered 2017-03-05 – 2017-03-07 (×6): 3.375 g via INTRAVENOUS
  Filled 2017-03-05 (×6): qty 50

## 2017-03-05 MED ORDER — METOCLOPRAMIDE HCL 10 MG PO TABS: 10.0000 mg | ORAL_TABLET | Freq: Three times a day (TID) | ORAL | Status: DC | PRN

## 2017-03-05 MED ORDER — ACETAMINOPHEN 325 MG PO TABS
650.0000 mg | ORAL_TABLET | Freq: Four times a day (QID) | ORAL | Status: DC | PRN
  Administered 2017-03-06: 650 mg via ORAL
  Filled 2017-03-05: qty 2

## 2017-03-05 MED ORDER — SODIUM CHLORIDE 0.9 % IV BOLUS (SEPSIS)
30.0000 mL/kg | Freq: Once | INTRAVENOUS | Status: AC
  Administered 2017-03-05: 2817 mL via INTRAVENOUS

## 2017-03-05 MED ORDER — HYDROMORPHONE HCL 2 MG/ML IJ SOLN
INTRAMUSCULAR | Status: AC
  Filled 2017-03-05: qty 1

## 2017-03-05 MED ORDER — HYDROMORPHONE HCL-NACL 0.5-0.9 MG/ML-% IV SOSY
0.5000 mg | PREFILLED_SYRINGE | INTRAVENOUS | Status: DC | PRN
  Administered 2017-03-05 – 2017-03-08 (×12): 0.5 mg via INTRAVENOUS
  Filled 2017-03-05 (×12): qty 1

## 2017-03-05 MED ORDER — HYDROMORPHONE HCL 4 MG PO TABS
4.0000 mg | ORAL_TABLET | ORAL | Status: DC | PRN
  Administered 2017-03-05: 8 mg via ORAL
  Administered 2017-03-06 – 2017-03-07 (×4): 4 mg via ORAL
  Filled 2017-03-05: qty 2
  Filled 2017-03-05: qty 1
  Filled 2017-03-05: qty 2
  Filled 2017-03-05 (×3): qty 1

## 2017-03-05 MED ORDER — PANTOPRAZOLE SODIUM 40 MG PO TBEC
40.0000 mg | DELAYED_RELEASE_TABLET | Freq: Two times a day (BID) | ORAL | Status: DC
  Administered 2017-03-05 – 2017-03-08 (×6): 40 mg via ORAL
  Filled 2017-03-05 (×6): qty 1

## 2017-03-05 MED ORDER — HYDROMORPHONE HCL 1 MG/ML IJ SOLN
2.0000 mg | Freq: Once | INTRAMUSCULAR | Status: AC
  Administered 2017-03-05: 2 mg via INTRAVENOUS
  Filled 2017-03-05: qty 2

## 2017-03-05 MED ORDER — DIAZEPAM 5 MG PO TABS: 2.5000 mg | ORAL_TABLET | Freq: Three times a day (TID) | ORAL | Status: DC | PRN

## 2017-03-05 MED ORDER — ONDANSETRON HCL 4 MG/2ML IJ SOLN: 4.0000 mg | Freq: Four times a day (QID) | INTRAMUSCULAR | Status: DC | PRN

## 2017-03-05 MED ORDER — ACETAMINOPHEN 325 MG PO TABS
650.0000 mg | ORAL_TABLET | ORAL | Status: AC
Start: 2017-03-05 — End: 2017-03-05
  Administered 2017-03-05: 650 mg via ORAL

## 2017-03-05 MED ORDER — HEPARIN SODIUM (PORCINE) 5000 UNIT/ML IJ SOLN: 5000.0000 [IU] | Freq: Three times a day (TID) | INTRAMUSCULAR | Status: DC

## 2017-03-05 MED ORDER — POLYETHYLENE GLYCOL 3350 17 G PO PACK
17.0000 g | PACK | Freq: Every day | ORAL | Status: DC
  Administered 2017-03-08: 17 g via ORAL
  Filled 2017-03-05 (×3): qty 1

## 2017-03-05 NOTE — Progress Notes (Signed)
Dr.  Posey Pronto paged per patient placement and made aware of pts. arrival to the unit

## 2017-03-05 NOTE — Progress Notes (Signed)
Pharmacy Antibiotic Note  Bryan Fields is a 56 y.o. male admitted on 03/05/2017 with sepsis, likely biliary source.  Patient undergoing chemo therapy for metastatic colon cancer.  Pharmacy has been consulted for Zosyn and Vancomycin dosing.  Plan: Vancomycin 1g IV q12h. Zosyn 3.375g IV q8h (4 hour infusion time). Daily SCr.     Temp (24hrs), Avg:101.2 F (38.4 C), Min:99.1 F (37.3 C), Max:103.2 F (39.6 C)   Recent Labs Lab 03/05/17 0401 03/05/17 0729  WBC 8.6  --   CREATININE 1.33*  --   LATICACIDVEN  --  1.14    Estimated Creatinine Clearance: 71.1 mL/min (A) (by C-G formula based on SCr of 1.33 mg/dL (H)).    Allergies  Allergen Reactions  . Other     Chemo therapy drug: Oxali Caused itching and redness.     Antimicrobials this admission: 9/28 Vancomcyin >>  9/28 Zosyn >>   Dose adjustments this admission:  Microbiology results: 9/28 BCx:   Thank you for allowing pharmacy to be a part of this patient's care.  Hershal Coria 03/05/2017 5:02 PM

## 2017-03-05 NOTE — ED Notes (Signed)
Pt. Made aware for the need of urine specimen. 
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
## 2017-03-05 NOTE — Progress Notes (Addendum)
[facility] OFFICE PROGRESS NOTE   Diagnosis:  Colon cancer  INTERVAL HISTORY:   Bryan Fields returns for scheduled follow-up. He woke up around 3:00 am this morning and had a temperature 103. He was seen in the emergency department, cultures obtained. He did not meet admission criteria and was discharged to our office for his scheduled follow-up appointment. He feels cold. He developed shaking chills in the office. His wife reports excessive drainage around the biliary drainage catheter this morning. She has changed the dressing multiple times. He denies nausea. No diarrhea. He denies shortness of breath. No cough. His wife reports intermittent confusion over the past several weeks. She states he has not been "acting like himself".  Objective:  Vital signs in last 24 hours:  Blood pressure 136/80, pulse 73, temperature 99.3 F (37.4 C), temperature source Oral, resp. rate 20, height 5\' 11"  (1.803 m), weight 208 lb 8 oz (94.6 kg), SpO2 100 %.    HEENT: No thrush or ulcers. Resp: Faint rales left lung base. Increased respiratory rate. Cardio: Regular rate and rhythm. GI: Masslike fullness inferior to the right upper quadrant scar. Abdomen is distended. Right upper quadrant biliary drain site without erythema, drainage noted. Vascular: No leg edema. Neuro: Appears to have mild confusion. Skin: Skin has a mottled appearance. Port-A-Cath without erythema.    Lab Results:  Lab Results  Component Value Date   WBC 8.6 03/05/2017   HGB 11.3 (L) 03/05/2017   HCT 33.9 (L) 03/05/2017   MCV 99.1 03/05/2017   PLT 156 03/05/2017   NEUTROABS 7.3 03/05/2017    Imaging:  Dg Chest 2 View  Result Date: 03/05/2017 CLINICAL DATA:  Suspected sepsis.  Fever.  Active chemotherapy. EXAM: CHEST  2 VIEW COMPARISON:  Chest radiograph 01/06/2017 FINDINGS: Tip of the right chest port in the mid SVC. Improved lung aeration from prior exam. The heart is normal in size. No consolidation,  pleural fluid or pulmonary edema. No pneumothorax. Biliary stent and biliary drain in the upper abdomen partially included. IMPRESSION: No acute chest finding. Electronically Signed   By: 03/08/2017 M.D.   On: 03/05/2017 04:14    Medications: I have reviewed the patient's current medications.  Assessment/Plan: 1. 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 Microsatellite stable; preserved expression of major and minor MMR proteins. He began treatment with FOLFOX in December 2011. He completed cycle #12 on 10/27/2010. Oxaliplatin was held with cycles number 7 through 10 and resumed with cycle #11. 1. History of neutropenia secondary to chemotherapy. 2. History of thrombocytopenia secondary chemotherapy. 3. He did not undergo a preoperative colonoscopy. He underwent a colonoscopy on 12/19/2010 with findings of a 1 cm polyp at 90 cm from the anal verge, minimal polyp at 30 cm from the anal verge and minimal diverticulosis. Colonoscopy 03/18/2012-negative. 4. 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 He began study drug on 05/13/2010. 5. Status post Port-A-Cath placement. The Port-A-Cath has been removed. 6. 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 8. Low attenuation lesion in the caudate lobe of the liver on the CT 04/11/2012-? Cyst. MRI liver on 04/20/2012 favored the caudate lobe liver lesion to represent a cyst or complex cyst. CT abdomen/pelvis 04/17/2013 with continued enlargement of hypovascular lesion in the caudate lobe of the liver.  PET scan 10/27/2011 confirmed a hypermetabolic caudate lobe liver lesion, tiny indeterminate lung nodules, and a 9 mm level II left neck node   CT biopsy of the liver lesion  11/07/2013 confirmed adenocarcinoma  Left liver and caudate resection 01/15/2014-pathology confirmed metastatic colon cancer, and negative surgical margins  Surveillance CT scans 07/24/2014 consistent with recurrent disease involving upper abdominal lymph nodes, peritoneal implants, and an anterior abdominal wall mass  Status post biopsy of the anterior abdominal wall mass 08/06/2014 with pathology showing metastatic adenocarcinoma consistent with a colon primary.  [id number] 1/28  Cycle 1 FOLFIRI/Avastin on the [facility] 1317 08/20/2014  Cycle 2 FOLFIRI/Avastin 09/03/2014  Cycle 3 held on 09/17/2014 due to neutropenia  Cycle 3 FOLFIRI/Avastin 09/25/2014. Neulasta added beginning with cycle 3.  Cycle 4 FOLFIRI/Avastin 10/08/2014  Restaging CT scans 10/18/2014 with slight interval increase in the size of the soft tissue mass in the subcutaneous fat of the epigastric region. Underlying peritoneal implants, probable focus of residual disease along the resected margin of the liver and hepatoduodenal ligament lymphadenopathy all appeared slightly improved. No new sites of metastatic disease otherwise noted. New left lower lobe bronchopneumonia.  Cycle 5 FOLFIRI/Avastin 10/22/2014  Cycle 6 FOLFIRI/Avastin 11/06/2014  Cycle 7 FOLFIRI Avastin 11/19/2014  Cycle 8 FOLFIRI/Avastin 12/03/2014  CT 12/14/2014 with stable disease  Cycle 9 FOLFIRI/Avastin 12/17/2014  Cycle 10 FOLFIRI/Avastin 01/07/2015 (5-fluorouracil and leucovorin dose reduced)  Cycle 11 FOLFIRI/Avastin 01/21/2015  Cycle 12 FOLFIRI/Avastin 02/04/2015  CT 02/13/2015 with stable disease  Cycle 13 FOLFIRI/Avastin 02/25/2015-changed to an every three-week protocol, now off study  Avastin held with cycle 14 FOLFIRI on 03/25/2015 secondary to gingivitis  Restaging CTs 05/20/2015-slight increase in the size of the epigastric subcutaneous mass, stable peritoneal implants  FOLFIRI continued on a 3 week schedule  Avastin resumed  06/17/2015  CTs 12/23/2015-stable subxiphoid mass, stable low-attenuation mass adjacent to the left liver surgical margin, peritoneal lesions-stable  FOLFIRI continued on a 4 week schedule.  Avastin placed on hold 01/06/2016  FOLFIRI discontinued 03/02/2016 secondary to enlargement of the abdominal wall mass with new superficial fungating nodules involving the skin  Palliative radiation to the abdominal wall mass completed 03/20/2016  Cycle 1 FOLFOX12/27/2017  Cycle 2 CAPOX 06/22/2016  Clinical progression 07/07/2016 and allergic reaction to oxaliplatin 06/23/2015, CAPOX discontinued  Resection of the main abdominal wall mass and an adjacent peritoneal mass on 07/23/2016 with the pathology confirming metastatic colon cancer  [facility] CT[facility] at [facility] 09/18/2016-new liver metastasis, progressive soft tissue masses at the liver resection margin, new mass adjacent to the chest wall resection site, increase in size of peritoneal nodules  CT abdomen/pelvis 10/02/2016-interval progression of periportal adenopathy and recurrence along the hepatic resection margin. New hepatic program lesion superior right hepatic lobe. Interval increase in size of peritoneal implants left upper quadrant. Enlargement of anabdominal wall mass along the right rectus muscle.  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 This stricture was stented with a 6cm long, uncovered 51mm diameter metal bile duct stent in good position.   Initiation of Lonsurf cycle 1 11/17/2016  MRI abdomen 12/24/2016-mild to moderate intrahepatic biliary ductal dilatation; stent in the common bile duct appears grossly patent; 3 intrahepatic metastases, extensive lymphadenopathy hepatoduodenal ligament and gastrohepatic ligament, multiple peritoneal implants in the upper abdomen and soft tissue metastases in the upper anterior abdominal wall musculature and  overlying subcutaneous fat on the right side.  Cycle 2 Lonsurf 01/18/2017   Cycle 3 Lonsurf 02/24/2017 dose  reduced 10. Iron deposition within the liver noted on MRI 04/20/2012. The ferritin level was normal on 04/17/2013. Increased iron deposition noted on the left liver resection 01/15/2014 11. Pain/bleeding at the anus reported when here 04/24/2013. Status post evaluation by Dr. [name] with findings of an anal fissure. 12. Right face cystic lesion 13. Status post Port-A-Cath placement 08/14/2014 14. Delayed nausea following FOLFIRI, prophylactic Decadron added with cycle 2 with improvement. 15. Chest CT 10/18/2014 with new left lower lobe bronchopneumonia. Levaquin initiated. Resolved on CT 12/14/2014 16. Gum pain-mucositis?, Gingivitis?-Improved with discontinuation of Avastin 17. Pain/tenderness, mild erythema and full appearance over the maxillary sinuses. Maxillofacial CT 01/30/2015 with mild to moderate bilateral maxillary and sphenoid sinus inflammatory changes. Multifocal right maxillary dental periapical lucency. Status post a right upper tooth extraction with resolution of the pain. 18. Admission 10/21/2016 with nausea/vomiting-x-ray evidence for a partial small bowel obstruction, clinical improvement with bowel rest, recurrent obstructive symptoms during the week of 11/30/2016 19. Admission 12/08/2016 with a high fever-Klebsiella bacteremia confirmed, likely a biliary source  20. Mild neutropenia and anemia/thrombocytopenia secondary to chemotherapy-the platelet count and white count have recovered during this hospital admission  21. Biliary obstruction-CT 12/14/2016 revealed intrahepatic biliary dilatation despite a common bile duct stent distended gallbladder  Nuclear medicine biliary scan  12/16/2016-poor hepatic uptake with nonvisualization of the bile ducts consistent with biliary obstruction  ERCP 12/16/2016 confirmed intrahepatic biliary dilatation, status post placement of a new bile duct stent  MRI abdomen 12/24/2016-mild to moderate intrahepatic biliary ductal dilatation; stent in the common bile duct appears grossly patent; 3 intrahepatic metastases, extensive lymphadenopathy hepatoduodenal ligament and gastrohepatic ligament, multiple peritoneal implants in the upper abdomen and soft tissue metastases in the upper anterior abdominal wall musculature and overlying subcutaneous fat on the right side.  ERCP 01/04/2017-plastic biliary stent removed, metal stent left in place, portal hypertensive changes noted  Percutaneous transhepatic cholangiogram 01/04/2017 confirmed complete occlusion of the metal stent with proximal dilation of bile ducts, status post placement of an internal/external biliary drain  02/09/2017-biliary drain exchanged and capped for a trial of internalization.  22. Fever following the 01/04/2017 ERCP procedure-biliary drainage positive for Staphylococcus hemolyticus  23. Pain secondary to metastatic colon cancer.MS Contin/Dilaudid   Disposition: Bryan Fields is a 56 year old man with metastatic colon cancer. He began cycle 3 Lonsurf at a reduced dose 02/24/2017. He was evaluated in the Emergency Department earlier today for a fever. He then came to our office for routine follow-up. While in the office he developed rigors, fever, nausea, skin mottling and appears acutely ill. Cultures were obtained in the Emergency Department. IV fluids have been initiated. We are beginning antibiotics with Zosyn. We have contacted the hospitalist for direct admission with likely biliary sepsis.    Ned Card ANP/GNP-BC   03/05/2017  10:43 AM  This was a shared visit with Ned Card. Bryan Fields was interviewed and examined. He presents today with acute onset  fever/chills. We suspect he has biliary sepsis. I contacted the hospitalist service. They will accept him for a direct hospital admission. We opened the biliary drain. He received intravenous fluids and Zosyn at the [facility]. Blood cultures from the emergency room are pending.  Lonsurf will be placed on hold.  I began a discussion regarding Hospice care with his wife. We will continue this discussion when he is recovered from the acute illness.  Julieanne Manson, M.D.

## 2017-03-05 NOTE — ED Notes (Signed)
Right biliary drain, draining yellowish greenish colored. Cleansed around site and guaze dressing applied for support and comfort. Pt tolerated well and described mild tenderness at the site. Stat lock and sutures intact.

## 2017-03-05 NOTE — ED Triage Notes (Addendum)
Patient woke up at two am and had a fever of 100.3. Patient is a chemo patient. Patient had drainage on the right side that has a stent to his liver that is suppose to be internal drainage. Patient took a 1000 mg of tylenol and 4mg  dilaudid around 2 am.

## 2017-03-06 ENCOUNTER — Encounter (HOSPITAL_COMMUNITY): Payer: Self-pay | Admitting: General Surgery

## 2017-03-06 ENCOUNTER — Inpatient Hospital Stay (HOSPITAL_COMMUNITY): Payer: Medicare Other

## 2017-03-06 HISTORY — PX: IR EXCHANGE BILIARY DRAIN: IMG6046

## 2017-03-06 LAB — COMPREHENSIVE METABOLIC PANEL
ALT: 18 U/L (ref 17–63)
ANION GAP: 6 (ref 5–15)
AST: 22 U/L (ref 15–41)
Albumin: 2.8 g/dL — ABNORMAL LOW (ref 3.5–5.0)
Alkaline Phosphatase: 112 U/L (ref 38–126)
BILIRUBIN TOTAL: 1.9 mg/dL — AB (ref 0.3–1.2)
BUN: 14 mg/dL (ref 6–20)
CO2: 23 mmol/L (ref 22–32)
Calcium: 8.2 mg/dL — ABNORMAL LOW (ref 8.9–10.3)
Chloride: 104 mmol/L (ref 101–111)
Creatinine, Ser: 1.07 mg/dL (ref 0.61–1.24)
Glucose, Bld: 90 mg/dL (ref 65–99)
POTASSIUM: 3.4 mmol/L — AB (ref 3.5–5.1)
Sodium: 133 mmol/L — ABNORMAL LOW (ref 135–145)
TOTAL PROTEIN: 7.1 g/dL (ref 6.5–8.1)

## 2017-03-06 LAB — CBC WITH DIFFERENTIAL/PLATELET
BASOS ABS: 0 10*3/uL (ref 0.0–0.1)
Basophils Relative: 0 %
Eosinophils Absolute: 0.1 10*3/uL (ref 0.0–0.7)
Eosinophils Relative: 2 %
HEMATOCRIT: 28.7 % — AB (ref 39.0–52.0)
Hemoglobin: 9.8 g/dL — ABNORMAL LOW (ref 13.0–17.0)
LYMPHS ABS: 0.9 10*3/uL (ref 0.7–4.0)
LYMPHS PCT: 23 %
MCH: 33.7 pg (ref 26.0–34.0)
MCHC: 34.1 g/dL (ref 30.0–36.0)
MCV: 98.6 fL (ref 78.0–100.0)
MONOS PCT: 8 %
Monocytes Absolute: 0.3 10*3/uL (ref 0.1–1.0)
Neutro Abs: 2.5 10*3/uL (ref 1.7–7.7)
Neutrophils Relative %: 67 %
Platelets: 110 10*3/uL — ABNORMAL LOW (ref 150–400)
RBC: 2.91 MIL/uL — ABNORMAL LOW (ref 4.22–5.81)
RDW: 15.3 % (ref 11.5–15.5)
WBC: 3.8 10*3/uL — AB (ref 4.0–10.5)

## 2017-03-06 LAB — PROTIME-INR
INR: 1.21
PROTHROMBIN TIME: 15.2 s (ref 11.4–15.2)

## 2017-03-06 MED ORDER — FENTANYL CITRATE (PF) 100 MCG/2ML IJ SOLN
INTRAMUSCULAR | Status: AC
  Filled 2017-03-06: qty 4

## 2017-03-06 MED ORDER — LIDOCAINE-EPINEPHRINE (PF) 2 %-1:200000 IJ SOLN
INTRAMUSCULAR | Status: AC
  Filled 2017-03-06: qty 20

## 2017-03-06 MED ORDER — IOPAMIDOL (ISOVUE-300) INJECTION 61%
INTRAVENOUS | Status: AC
  Administered 2017-03-06: 10 mL
  Filled 2017-03-06: qty 50

## 2017-03-06 MED ORDER — FENTANYL CITRATE (PF) 100 MCG/2ML IJ SOLN
INTRAMUSCULAR | Status: AC | PRN
  Administered 2017-03-06 (×2): 50 ug via INTRAVENOUS

## 2017-03-06 MED ORDER — MIDAZOLAM HCL 2 MG/2ML IJ SOLN
INTRAMUSCULAR | Status: AC
  Filled 2017-03-06: qty 4

## 2017-03-06 MED ORDER — MIDAZOLAM HCL 2 MG/2ML IJ SOLN
INTRAMUSCULAR | Status: AC | PRN
  Administered 2017-03-06 (×2): 1 mg via INTRAVENOUS

## 2017-03-06 MED ORDER — LIDOCAINE-EPINEPHRINE (PF) 2 %-1:200000 IJ SOLN
INTRAMUSCULAR | Status: AC | PRN
  Administered 2017-03-06: 10 mL

## 2017-03-06 MED ORDER — SODIUM CHLORIDE 0.9% FLUSH
5.0000 mL | Freq: Three times a day (TID) | INTRAVENOUS | Status: DC
  Administered 2017-03-06 – 2017-03-08 (×7): 5 mL via INTRAVENOUS

## 2017-03-06 NOTE — Progress Notes (Addendum)
MEDICATION-RELATED CONSULT NOTE   IR Procedure Consult - Anticoagulant/Antiplatelet PTA/Inpatient Med List Review by Pharmacist    Procedure: biliary drain exchange    Completed: 9/29 at ~17:00  Post-Procedural bleeding risk per IR MD assessment:  standard  Antithrombotic medications on inpatient or PTA profile prior to procedure:   NONE    Recommended restart time per IR Post-Procedure Guidelines:     Other considerations:      Plan:      No adjustments or modfications made as patient is currently not on antithrombotic medication  Suggest ordering pharmacologic VTE prophylaxis as appropriate  Doreene Eland, PharmD, BCPS.   Pager: [phone] 03/06/2017 5:40 PM

## 2017-03-06 NOTE — Progress Notes (Signed)
[facility] Progress Note  Patient: Bryan Fields ZOX:[id number]   PCP: Ria Bush, MD DOB: 03-04-1961   DOA: 03/05/2017   DOS: 03/06/2017   Date of Service: the patient was seen and examined on 03/06/2017  Subjective: Feeling better, no nausea no vomiting. Did have a BM. No fever no chills since admission.  Brief hospital course: Pt. 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 Biliary stricture S/P percutaneous drain placement, BPH, GERD, cancer-related pain admitted on 03/05/2017, presented with complaint of fever and chills, was found to have cholangitis. Currently further plan is biliary exchange and continue IV antibiotics.  Assessment and Plan: 1. Suspected cholangitis. Patient presents with fever chills has a history of biliary drainage. Has also history of infection of the biliary drain. At present suspected cholangitis. Prior culture has grown Klebsiella in blood and Streptococcus hemolyticus in by. Therefore patient will be empirically cover with vancomycin and Zosyn. Blood cultures performed in the ER will monitor that. IR consulted for biliary drain exchange which will happen tomorrow. We'll monitor the response to treatment.  2. Stage IV colon cancer. Goals of care discussion. Patient wants to change his CODE STATUS from full code to DNR/DNI for now. He also has a living will which also seems to seen. Patient has cancer-related pain and will continue with Dilaudid as well as use IV Dilaudid for pain control. Chemotherapy is currently on hold. After This Treatment of This Acute Event Is to Consider Hospice As an Option per Patient.  3. GERD. Continue PPI.  4. Essential hypertension. Currently holding nadolol in the setting of possible sepsis.  5. Muscle spasm. Continue Valium per home regimen.  Diet: regular diet DVT Prophylaxis: subcutaneous Heparin  Advance goals of care discussion: DNR  DNI  Family Communication: family was present at bedside, at the time of interview. The pt provided permission to discuss medical plan with the family. Opportunity was given to ask question and all questions were answered satisfactorily.   Disposition:  Discharge to home.  Consultants: IR Procedures: IR guided biliary drain change  Antibiotics: Anti-infectives    Start     Dose/Rate Route Frequency Ordered Stop   03/05/17 1800  vancomycin (VANCOCIN) IVPB 1000 mg/200 mL premix     1,000 mg 200 mL/hr over 60 Minutes Intravenous Every 12 hours 03/05/17 1718     03/05/17 1800  piperacillin-tazobactam (ZOSYN) IVPB 3.375 g     3.375 g 12.5 mL/hr over 240 Minutes Intravenous Every 8 hours 03/05/17 1718         Objective: Physical Exam: Vitals:   03/06/17 1421 03/06/17 1622 03/06/17 1625 03/06/17 1633  BP: 135/83 (!) 149/81 (!) 150/80 (!) 144/78  Pulse: 66 66 62 63  Resp: 16 18 16 16   Temp: 98.4 F (36.9 C)     TempSrc: Oral     SpO2: 100% 100% 100% 100%    Intake/Output Summary (Last 24 hours) at 03/06/17 1641 Last data filed at 03/06/17 1300  Gross per 24 hour  Intake             2790 ml  Output             1050 ml  Net             1740 ml   There were no vitals filed for this visit. General: Alert, Awake and Oriented to Time, Place and Person. Appear in mild distress, affect appropriate Eyes: PERRL, Conjunctiva normal  ENT: Oral Mucosa clear moist. Neck: no JVD, no Abnormal Mass Or lumps Cardiovascular: S1 and S2 Present, no Murmur, Peripheral Pulses Present Respiratory: normal respiratory effort, Bilateral Air entry equal and Decreased, no use of accessory muscle, Clear to Auscultation, no Crackles, no wheezes Abdomen: Bowel Sound present, Soft and no tenderness, no hernia Skin: no redness, no Rash, no induration Extremities: no Pedal edema, no calf tenderness Neurologic: Grossly no focal neuro deficit. Bilaterally Equal motor strength  Data Reviewed: CBC:  Recent  Labs Lab 03/05/17 0401 03/06/17 0500  WBC 8.6 3.8*  NEUTROABS 7.3 2.5  HGB 11.3* 9.8*  HCT 33.9* 28.7*  MCV 99.1 98.6  PLT 156 974*   Basic Metabolic Panel:  Recent Labs Lab 03/05/17 0401 03/05/17 1713 03/06/17 0500  NA 132* 134* 133*  K 3.5 3.6 3.4*  CL 102 106 104  CO2 21* 22 23  GLUCOSE 125* 108* 90  BUN 14 15 14   CREATININE 1.33* 1.12 1.07  CALCIUM 8.6* 8.0* 8.2*  MG  --  1.6*  --     Liver Function Tests:  Recent Labs Lab 03/05/17 0401 03/05/17 1713 03/06/17 0500  AST 34 26 22  ALT 23 20 18   ALKPHOS 160* 114 112  BILITOT 2.3* 2.2* 1.9*  PROT 7.9 6.9 7.1  ALBUMIN 3.3* 2.8* 2.8*   No results for input(s): LIPASE, AMYLASE in the last 168 hours. No results for input(s): AMMONIA in the last 168 hours. Coagulation Profile:  Recent Labs Lab 03/05/17 0401 03/06/17 0500  INR 1.18 1.21   Cardiac Enzymes: No results for input(s): CKTOTAL, CKMB, CKMBINDEX, TROPONINI in the last 168 hours. BNP (last 3 results) No results for input(s): PROBNP in the last 8760 hours. CBG: No results for input(s): GLUCAP in the last 168 hours. Studies: No results found.  Scheduled Meds: . pantoprazole  40 mg Oral Q12H  . polyethylene glycol  17 g Oral Daily   Continuous Infusions: . sodium chloride 100 mL/hr at 03/05/17 1753  . piperacillin-tazobactam (ZOSYN)  IV Stopped (03/06/17 1328)  . vancomycin Stopped (03/06/17 0700)   PRN Meds: acetaminophen **OR** acetaminophen, diazepam, fentaNYL, HYDROmorphone (DILAUDID) injection, HYDROmorphone, lidocaine-EPINEPHrine, metoCLOPramide, midazolam, ondansetron **OR** ondansetron (ZOFRAN) IV, senna-docusate  Time spent: 35 minutes  Author: Berle Mull, MD [facility] Pager: [phone] 03/06/2017 4:41 PM  If 7PM-7AM, please contact night-coverage at [email or web address], password [facility]
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
## 2017-03-06 NOTE — Procedures (Signed)
Successful fluroscopic exchange of a right sided approach transhepatic12 Fr biliary drainage catheter with end coiled and locked within the duodenum.   Biliary drain connected to gravity bag.  EBL: None No immediate post procedural complications.  Ronny Bacon, MD Pager #: [phone]

## 2017-03-07 LAB — CBC WITH DIFFERENTIAL/PLATELET
Basophils Absolute: 0 10*3/uL (ref 0.0–0.1)
Basophils Relative: 0 %
EOS ABS: 0.1 10*3/uL (ref 0.0–0.7)
EOS PCT: 2 %
HCT: 27.7 % — ABNORMAL LOW (ref 39.0–52.0)
Hemoglobin: 9.4 g/dL — ABNORMAL LOW (ref 13.0–17.0)
LYMPHS ABS: 0.7 10*3/uL (ref 0.7–4.0)
LYMPHS PCT: 25 %
MCH: 33.1 pg (ref 26.0–34.0)
MCHC: 33.9 g/dL (ref 30.0–36.0)
MCV: 97.5 fL (ref 78.0–100.0)
MONO ABS: 0.2 10*3/uL (ref 0.1–1.0)
Monocytes Relative: 6 %
Neutro Abs: 1.8 10*3/uL (ref 1.7–7.7)
Neutrophils Relative %: 66 %
PLATELETS: 111 10*3/uL — AB (ref 150–400)
RBC: 2.84 MIL/uL — AB (ref 4.22–5.81)
RDW: 15 % (ref 11.5–15.5)
WBC: 2.7 10*3/uL — AB (ref 4.0–10.5)

## 2017-03-07 LAB — COMPREHENSIVE METABOLIC PANEL
ALBUMIN: 2.6 g/dL — AB (ref 3.5–5.0)
ALT: 17 U/L (ref 17–63)
AST: 18 U/L (ref 15–41)
Alkaline Phosphatase: 85 U/L (ref 38–126)
Anion gap: 8 (ref 5–15)
BILIRUBIN TOTAL: 1.3 mg/dL — AB (ref 0.3–1.2)
BUN: 11 mg/dL (ref 6–20)
CHLORIDE: 106 mmol/L (ref 101–111)
CO2: 21 mmol/L — ABNORMAL LOW (ref 22–32)
CREATININE: 1.06 mg/dL (ref 0.61–1.24)
Calcium: 8.2 mg/dL — ABNORMAL LOW (ref 8.9–10.3)
GFR calc Af Amer: >60 mL/min (ref >60)
GLUCOSE: 94 mg/dL (ref 65–99)
POTASSIUM: 3 mmol/L — AB (ref 3.5–5.1)
Sodium: 135 mmol/L (ref 135–145)
TOTAL PROTEIN: 6.8 g/dL (ref 6.5–8.1)

## 2017-03-07 LAB — MAGNESIUM: MAGNESIUM: 1.7 mg/dL (ref 1.7–2.4)

## 2017-03-07 LAB — VANCOMYCIN, TROUGH: Vancomycin Tr: 16 ug/mL (ref 15–20)

## 2017-03-07 MED ORDER — CIPROFLOXACIN HCL 500 MG PO TABS
500.0000 mg | ORAL_TABLET | Freq: Two times a day (BID) | ORAL | Status: DC
  Administered 2017-03-07 – 2017-03-08 (×2): 500 mg via ORAL
  Filled 2017-03-07 (×2): qty 1

## 2017-03-07 MED ORDER — DOXYCYCLINE HYCLATE 100 MG PO TABS
100.0000 mg | ORAL_TABLET | Freq: Two times a day (BID) | ORAL | Status: DC
  Administered 2017-03-07 – 2017-03-08 (×3): 100 mg via ORAL
  Filled 2017-03-07 (×3): qty 1

## 2017-03-07 MED ORDER — POTASSIUM CHLORIDE CRYS ER 20 MEQ PO TBCR
40.0000 meq | EXTENDED_RELEASE_TABLET | Freq: Once | ORAL | Status: AC
  Administered 2017-03-07: 40 meq via ORAL
  Filled 2017-03-07: qty 2

## 2017-03-07 NOTE — Progress Notes (Signed)
Pharmacy Antibiotic Note  Bryan Fields is a 56 y.o. male with colon cancer currently being treated with Lonsurf and hx indwelling biliary sent, presented to the ED on 03/05/2017 with fever. With suspected sepsis and cholangitis, he was started on broad abx with vancomycin and zosyn on admission.  Today, 03/07/2017: -  afeb, wbc down and low - scr down 1.07 (crcl~89) - s/p biliary drain exchange on 9/29 - all cultures have been negative thus far - vancomycin trough level came back this morning therapeutic at 16   Plan: - continue vancomycin 1000 mg Iv q12h - continue zosyn 3.375 gm IV q8h (infuse over 4 hrs) - monitor renal function. F/u cultures and plan/LOT for abx _________________________________   Temp (24hrs), Avg:98.6 F (37 C), Min:98.2 F (36.8 C), Max:99.4 F (37.4 C)   Recent Labs Lab 03/05/17 0401 03/05/17 0729 03/05/17 1713 03/06/17 0500 03/07/17 0515  WBC 8.6  --   --  3.8* 2.7*  CREATININE 1.33*  --  1.12 1.07 1.06  LATICACIDVEN  --  1.14  --   --   --   VANCOTROUGH  --   --   --   --  16    Estimated Creatinine Clearance: 89.2 mL/min (by C-G formula based on SCr of 1.06 mg/dL).    Allergies  Allergen Reactions  . Other     Chemo therapy drug: Oxali Caused itching and redness.     Antimicrobials this admission: 9/28 Vancomcyin >>  9/28 Zosyn >>    Dose adjustments this admission: 9/30 VT at 0515 = 16 (on 1gm q12h)   Microbiology results: 9/28 BCx x2:   Thank you for allowing pharmacy to be a part of this patient's care.  Lynelle Doctor 03/07/2017 9:27 AM

## 2017-03-07 NOTE — Progress Notes (Signed)
[facility] Progress Note  Patient: Bryan Fields:[id number]   PCP: Bryan Bush, MD DOB: 04/12/1961   DOA: 03/05/2017   DOS: 03/07/2017   Date of Service: the patient was seen and examined on 03/07/2017  Subjective: No nausea no vomiting. No chest pain. Abdominal pain reoccurred last night but currently resolved. No diarrhea or constipation.  Brief hospital course: Pt. 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 Biliary stricture S/P percutaneous drain placement, BPH, GERD, cancer-related pain admitted on 03/05/2017, presented with complaint of fever and chills, was found to have cholangitis. Currently further plan is to monitor on current antibiotics.  Assessment and Plan: 1. Suspected cholangitis. Patient presents with fever chills has a history of biliary drainage. Has also history of infection of the biliary drain. At present suspected cholangitis. Prior culture has grown Klebsiella in blood and Streptococcus hemolyticus in by. Therefore patient was empirically cover with vancomycin and Zosyn. Blood cultures performed in the ER , no growth for 48 hrs. IR consulted for biliary drain exchange tolerated procedure well. We will transition him to oral Cipro and doxycycline and monitor overnight. If stable likely can be discharged tomorrow.  2. Stage IV colon cancer. Goals of care discussion. Patient wants to change his CODE STATUS from full code to DNR/DNI for now. He also has a living will which also seems to seen. Patient has cancer-related pain and will continue with Dilaudid as well as use IV Dilaudid for pain control. Chemotherapy is currently on hold. After This Treatment of This Acute Event Is to Consider Hospice As an Option per Patient.  3. GERD. Continue PPI.  4. Essential hypertension. Currently holding nadolol in the setting of possible sepsis.  5. Muscle spasm. Continue Valium per home  regimen.  Diet: regular diet DVT Prophylaxis: subcutaneous Heparin  Advance goals of care discussion: DNR DNI  Family Communication: family was present at bedside, at the time of interview. The pt provided permission to discuss medical plan with the family. Opportunity was given to ask question and all questions were answered satisfactorily.   Disposition:  Discharge to home. Physical therapy consulted.  Consultants: IR Procedures: IR guided biliary drain change  Antibiotics: Anti-infectives    Start     Dose/Rate Route Frequency Ordered Stop   03/07/17 2000  ciprofloxacin (CIPRO) tablet 500 mg     500 mg Oral 2 times daily 03/07/17 1201     03/07/17 1200  doxycycline (VIBRA-TABS) tablet 100 mg     100 mg Oral Every 12 hours 03/07/17 1158     03/05/17 1800  vancomycin (VANCOCIN) IVPB 1000 mg/200 mL premix  Status:  Discontinued     1,000 mg 200 mL/hr over 60 Minutes Intravenous Every 12 hours 03/05/17 1718 03/07/17 1158   03/05/17 1800  piperacillin-tazobactam (ZOSYN) IVPB 3.375 g  Status:  Discontinued     3.375 g 12.5 mL/hr over 240 Minutes Intravenous Every 8 hours 03/05/17 1718 03/07/17 1158       Objective: Physical Exam: Vitals:   03/06/17 1802 03/06/17 2245 03/07/17 0615 03/07/17 1448  BP: (!) 144/81 132/74 132/70 130/74  Pulse: 60 68 61 94  Resp: 16 16 20 18   Temp: 98.6 F (37 C) 99.4 F (37.4 C) 98.2 F (36.8 C) 98.6 F (37 C)  TempSrc: Axillary Oral Oral Oral  SpO2: 100% 100% 97% 99%    Intake/Output Summary (Last 24 hours) at 03/07/17 1526 Last data filed at 03/07/17 1300  Gross per 24 hour  Intake         45960.67 ml  Output             1250 ml  Net         44710.67 ml   There were no vitals filed for this visit. General: Alert, Awake and Oriented to Time, Place and Person. Appear in mild distress, affect appropriate Eyes: PERRL, Conjunctiva normal ENT: Oral Mucosa clear moist. Neck: no JVD, no Abnormal Mass Or lumps Cardiovascular: S1 and S2  Present, no Murmur, Peripheral Pulses Present Respiratory: normal respiratory effort, Bilateral Air entry equal and Decreased, no use of accessory muscle, Clear to Auscultation, no Crackles, no wheezes Abdomen: Bowel Sound present, Soft and no tenderness, no hernia Skin: no redness, no Rash, no induration Extremities: no Pedal edema, no calf tenderness Neurologic: Grossly no focal neuro deficit. Bilaterally Equal motor strength  Data Reviewed: CBC:  Recent Labs Lab 03/05/17 0401 03/06/17 0500 03/07/17 0515  WBC 8.6 3.8* 2.7*  NEUTROABS 7.3 2.5 1.8  HGB 11.3* 9.8* 9.4*  HCT 33.9* 28.7* 27.7*  MCV 99.1 98.6 97.5  PLT 156 110* 161*   Basic Metabolic Panel:  Recent Labs Lab 03/05/17 0401 03/05/17 1713 03/06/17 0500 03/07/17 0515  NA 132* 134* 133* 135  K 3.5 3.6 3.4* 3.0*  CL 102 106 104 106  CO2 21* 22 23 21*  GLUCOSE 125* 108* 90 94  BUN 14 15 14 11   CREATININE 1.33* 1.12 1.07 1.06  CALCIUM 8.6* 8.0* 8.2* 8.2*  MG  --  1.6*  --  1.7    Liver Function Tests:  Recent Labs Lab 03/05/17 0401 03/05/17 1713 03/06/17 0500 03/07/17 0515  AST 34 26 22 18   ALT 23 20 18 17   ALKPHOS 160* 114 112 85  BILITOT 2.3* 2.2* 1.9* 1.3*  PROT 7.9 6.9 7.1 6.8  ALBUMIN 3.3* 2.8* 2.8* 2.6*   No results for input(s): LIPASE, AMYLASE in the last 168 hours. No results for input(s): AMMONIA in the last 168 hours. Coagulation Profile:  Recent Labs Lab 03/05/17 0401 03/06/17 0500  INR 1.18 1.21   Cardiac Enzymes: No results for input(s): CKTOTAL, CKMB, CKMBINDEX, TROPONINI in the last 168 hours. BNP (last 3 results) No results for input(s): PROBNP in the last 8760 hours. CBG: No results for input(s): GLUCAP in the last 168 hours. Studies: Ir Exchange Biliary Drain  Result Date: 03/06/2017 INDICATION: History of metastatic colon cancer, post left hepatectomy with recurrent disease at the level of the biliary hilum. Patient has previously undergone endoscopic placement of an  un-covered metal biliary stent on 10/15/2016 and subsequent placement of an additional internal plastic biliary stent on 12/16/2016. Unfortunately, the patient experienced persistent hyperbilirubinemia and as such, underwent percutaneous transhepatic biliary drainage catheter placement by Dr. Earleen Newport on 01/04/2017. Patient recently underwent fluoroscopic guided percutaneous cholangiogram and biliary drainage catheter up sizing on 02/09/2017. Unfortunately, the patient return to the was aligned emergency department last evening with symptoms worrisome for cholangitis as well as leaking around the previously capped biliary drainage catheter. As such, patient presents today for fluoroscopic guided biliary drainage catheter exchange EXAM: FLUOROSCOPIC GUIDED PERCUTANEOUS BILIARY DRAINAGE CATHETER EXCHANGE COMPARISON:  COMPARISON Fluoroscopic guided biliary drainage catheter exchange - 02/09/2017; biliary drainage catheter placement - 01/04/2017; CT abdomen pelvis - 12/14/2016; MRCP -12/24/2016 CONTRAST:  20 cc Omnipaque-300 administered into the collecting system FLUOROSCOPY TIME:  1 minute (25 mGy) COMPLICATIONS: None immediate. TECHNIQUE: Informed written consent was obtained from the patient after a  discussion of the risks, benefits and alternatives to treatment. Questions regarding the procedure were encouraged and answered. A timeout was performed prior to the initiation of the procedure. The external portion of the existing right-sided percutaneous biliary drainage catheter as well as the surrounding skin were prepped and draped in the usual sterile fashion. A sterile drape was applied covering the operative field. Maximum barrier sterile technique with sterile gowns and gloves were used for the procedure. A timeout was performed prior to the initiation of the procedure. A pre procedural spot fluoroscopic image was obtained after contrast was injected via the existing biliary drainage catheter. The existing biliary  drainage catheter was cut and cannulated with a stiff Glidewire wire which was coiled within the proximal small bowel. Under intermittent fluoroscopic guidance, the existing PBD was exchanged for a new 12 [place] PBD. Small amount of contrast was injected via the exchanged biliary drainage catheter and a post exchange spot fluoroscopic image was obtained. The catheter was locked and secured to the skin with a single interrupted suture. The biliary drainage catheter was connected to a gravity bag. A dressing was placed. The patient tolerated the procedure well without immediate postprocedural complication. FINDINGS: Preprocedural spot fluoroscopic image demonstrates unchanged positioning of the percutaneous drainage catheter as well as the previously placed endoscopic biliary stent. Multiple surgical clips overlie the biliary hilum compatible with provided history of prior hepatectomy. The existing percutaneous biliary catheter is appropriately positioned and functioning. After successful fluoroscopic guided exchange, the new 12 French percutaneous biliary catheter is appropriately positioned with end coiled and locked within the proximal duodenum. IMPRESSION: Successful fluoroscopic guided exchange of right sided 12 French percutaneous biliary drainage catheter. Biliary drainage catheter has now been connected to the gravity bag. PLAN: Given presence of advance metastatic disease on abdominal CT and MRCP performed in July of this year, would have a low threshold for repeat a contrast-enhanced CT of the chest, abdomen pelvis to evaluate for disease progression. Electronically Signed   By: Sandi Mariscal M.D.   On: 03/06/2017 16:58    Scheduled Meds: . ciprofloxacin  500 mg Oral BID  . doxycycline  100 mg Oral Q12H  . pantoprazole  40 mg Oral Q12H  . polyethylene glycol  17 g Oral Daily  . sodium chloride flush  5 mL Intravenous Q8H   Continuous Infusions: . sodium chloride 100 mL/hr at 03/07/17 0629   PRN  Meds: acetaminophen **OR** acetaminophen, diazepam, HYDROmorphone (DILAUDID) injection, HYDROmorphone, metoCLOPramide, ondansetron **OR** ondansetron (ZOFRAN) IV, senna-docusate  Time spent: 35 minutes  Author: Berle Mull, MD [facility] Pager: [phone] 03/07/2017 3:26 PM  If 7PM-7AM, please contact night-coverage at [email or web address], password [facility]

## 2017-03-07 NOTE — Progress Notes (Signed)
PHARMACY - ANTIBIOTICS  See full note from earlier today by Dia Sitter, PharmD  Update: Vancomycin and Zosyn have been d/c'ed.  MD has started Doxycycline 100mg  po BID and consulted pharmacy to dose Cipro orally for intra-abdominal infection.  CrCl = 89 ml/min  PLAN: Cipro 500mg  po BID Need for further dosage adjustment appears unlikely at present.    Will sign off at this time.  Please reconsult if a change in clinical status warrants re-evaluation of dosage.  Leone Haven, PharmD 03/07/17 @ 12:07

## 2017-03-08 ENCOUNTER — Other Ambulatory Visit: Payer: Self-pay | Admitting: *Deleted

## 2017-03-08 DIAGNOSIS — D709 Neutropenia, unspecified: Secondary | ICD-10-CM

## 2017-03-08 DIAGNOSIS — D696 Thrombocytopenia, unspecified: Secondary | ICD-10-CM

## 2017-03-08 DIAGNOSIS — C189 Malignant neoplasm of colon, unspecified: Secondary | ICD-10-CM

## 2017-03-08 DIAGNOSIS — F329 Major depressive disorder, single episode, unspecified: Secondary | ICD-10-CM

## 2017-03-08 DIAGNOSIS — K8309 Other cholangitis: Secondary | ICD-10-CM

## 2017-03-08 DIAGNOSIS — R7881 Bacteremia: Secondary | ICD-10-CM

## 2017-03-08 DIAGNOSIS — G893 Neoplasm related pain (acute) (chronic): Secondary | ICD-10-CM

## 2017-03-08 DIAGNOSIS — C787 Secondary malignant neoplasm of liver and intrahepatic bile duct: Secondary | ICD-10-CM

## 2017-03-08 DIAGNOSIS — C182 Malignant neoplasm of ascending colon: Secondary | ICD-10-CM

## 2017-03-08 LAB — COMPREHENSIVE METABOLIC PANEL
ALBUMIN: 2.5 g/dL — AB (ref 3.5–5.0)
ALK PHOS: 77 U/L (ref 38–126)
ALT: 13 U/L — ABNORMAL LOW (ref 17–63)
ANION GAP: 6 (ref 5–15)
AST: 17 U/L (ref 15–41)
BILIRUBIN TOTAL: 0.9 mg/dL (ref 0.3–1.2)
BUN: 8 mg/dL (ref 6–20)
CALCIUM: 8.2 mg/dL — AB (ref 8.9–10.3)
CO2: 22 mmol/L (ref 22–32)
Chloride: 109 mmol/L (ref 101–111)
Creatinine, Ser: 0.98 mg/dL (ref 0.61–1.24)
GFR calc Af Amer: >60 mL/min (ref >60)
GLUCOSE: 95 mg/dL (ref 65–99)
Potassium: 3.1 mmol/L — ABNORMAL LOW (ref 3.5–5.1)
Sodium: 137 mmol/L (ref 135–145)
Total Protein: 6.5 g/dL (ref 6.5–8.1)

## 2017-03-08 LAB — CBC WITH DIFFERENTIAL/PLATELET
BASOS ABS: 0 10*3/uL (ref 0.0–0.1)
BASOS PCT: 1 %
EOS ABS: 0.1 10*3/uL (ref 0.0–0.7)
Eosinophils Relative: 4 %
HCT: 26.4 % — ABNORMAL LOW (ref 39.0–52.0)
Hemoglobin: 9.1 g/dL — ABNORMAL LOW (ref 13.0–17.0)
LYMPHS ABS: 0.7 10*3/uL (ref 0.7–4.0)
Lymphocytes Relative: 37 %
MCH: 33.2 pg (ref 26.0–34.0)
MCHC: 34.5 g/dL (ref 30.0–36.0)
MCV: 96.4 fL (ref 78.0–100.0)
Monocytes Absolute: 0.1 10*3/uL (ref 0.1–1.0)
Monocytes Relative: 7 %
NEUTROS ABS: 1 10*3/uL — AB (ref 1.7–7.7)
Neutrophils Relative %: 51 %
Platelets: 111 10*3/uL — ABNORMAL LOW (ref 150–400)
RBC: 2.74 MIL/uL — ABNORMAL LOW (ref 4.22–5.81)
RDW: 15.1 % (ref 11.5–15.5)
WBC: 1.9 10*3/uL — ABNORMAL LOW (ref 4.0–10.5)

## 2017-03-08 LAB — MAGNESIUM: Magnesium: 1.7 mg/dL (ref 1.7–2.4)

## 2017-03-08 MED ORDER — SERTRALINE HCL 25 MG PO TABS
25.0000 mg | ORAL_TABLET | Freq: Every day | ORAL | 0 refills | Status: DC
Start: 2017-03-08 — End: 2017-03-24

## 2017-03-08 MED ORDER — HEPARIN SOD (PORK) LOCK FLUSH 100 UNIT/ML IV SOLN
500.0000 [IU] | INTRAVENOUS | Status: AC | PRN
  Administered 2017-03-08: 500 [IU]
  Filled 2017-03-08: qty 5

## 2017-03-08 MED ORDER — CIPROFLOXACIN HCL 500 MG PO TABS: 500.0000 mg | ORAL_TABLET | Freq: Two times a day (BID) | ORAL | 0 refills | Status: AC

## 2017-03-08 MED ORDER — DOXYCYCLINE HYCLATE 100 MG PO TABS: 100.0000 mg | ORAL_TABLET | Freq: Two times a day (BID) | ORAL | 0 refills | Status: AC

## 2017-03-08 MED ORDER — HYDROMORPHONE HCL 4 MG PO TABS: 4.0000 mg | ORAL_TABLET | ORAL | 0 refills | Status: DC | PRN

## 2017-03-08 MED ORDER — SERTRALINE HCL 25 MG PO TABS
25.0000 mg | ORAL_TABLET | Freq: Every day | ORAL | Status: DC
  Administered 2017-03-08: 25 mg via ORAL
  Filled 2017-03-08: qty 1

## 2017-03-08 MED ORDER — NADOLOL 40 MG PO TABS: 40.0000 mg | ORAL_TABLET | Freq: Every day | ORAL | 0 refills | Status: DC

## 2017-03-08 NOTE — Progress Notes (Signed)
No PT follow up nor DME needed per PT recommendations. Marney Doctor RN,BSN,NCM [phone]

## 2017-03-08 NOTE — Progress Notes (Signed)
Referring Physician(s): Patel,P/Sherrill,B  Supervising Physician: Daryll Brod  Patient Status:  [facility] - In-pt  Chief Complaint:  Metastatic colon cancer to liver, biliary obstruction, cholangitis  Subjective:  Patient feeling a little better today. Still has some soreness at right upper quadrant drain site. Has ambulated. Denies significant nausea or vomiting.   Allergies: Other  Medications: Prior to Admission medications   Medication Sig Start Date End Date Taking? Authorizing Provider  acetaminophen (TYLENOL) 325 MG tablet Take 650 mg by mouth every 6 (six) hours as needed for moderate pain or fever.   Yes [provider]  Cyanocobalamin (VITAMIN B-12) 2500 MCG SUBL Place 1 tablet under the tongue daily.   Yes [provider]  diazepam (VALIUM) 5 MG tablet Take 1 tablet (5 mg total) by mouth 3 (three) times daily as needed for muscle spasms. Patient taking differently: Take 2.5 mg by mouth 3 (three) times daily as needed for muscle spasms.  02/01/17  Yes Ladell Pier, MD  ibuprofen (ADVIL,MOTRIN) 200 MG tablet Take 600 mg by mouth every 6 (six) hours as needed for mild pain.    Yes [provider]  lidocaine-prilocaine (EMLA) cream Apply 1 application topically as needed (apply to port).   Yes [provider]  metoCLOPramide (REGLAN) 10 MG tablet Take 1 tablet (10 mg total) by mouth 3 (three) times daily before meals. Patient taking differently: Take 10 mg by mouth every 8 (eight) hours as needed for nausea or vomiting.  02/04/17 03/06/17 Yes Ladell Pier, MD  morphine (MS CONTIN) 30 MG 12 hr tablet Take 1 tablet (30 mg total) by mouth every 12 (twelve) hours. Patient taking differently: Take 30 mg by mouth 2 (two) times daily as needed. Pain 02/01/17  Yes Ladell Pier, MD  nadolol (CORGARD) 80 MG tablet Take 1 tablet (80 mg total) by mouth daily. 02/01/17  Yes Ladell Pier, MD  ondansetron (ZOFRAN) 8 MG tablet Take 1 tablet (8  mg total) by mouth every 8 (eight) hours as needed for nausea or vomiting. 11/27/16  Yes Ladell Pier, MD  pantoprazole (PROTONIX) 40 MG tablet Take 1 tablet (40 mg total) by mouth every 12 (twelve) hours. 02/01/17  Yes Ladell Pier, MD  polyethylene glycol (MIRALAX / GLYCOLAX) packet Take 17 g by mouth daily.   Yes [provider]  trifluridine-tipiracil (LONSURF) 20-8.19 MG tablet Take 4 tablets (80 mg of trifluridine total) by mouth 2 (two) times daily after a meal. On days 1-5, 8-12. Repeat every 28days 02/15/17  Yes Ladell Pier, MD  ciprofloxacin (CIPRO) 500 MG tablet Take 1 tablet (500 mg total) by mouth 2 (two) times daily. 03/08/17 03/12/17  Lavina Hamman, MD  doxycycline (VIBRA-TABS) 100 MG tablet Take 1 tablet (100 mg total) by mouth every 12 (twelve) hours. 03/08/17 03/12/17  Lavina Hamman, MD  HYDROmorphone (DILAUDID) 4 MG tablet Take 1-2 tablets (4-8 mg total) by mouth every 4 (four) hours as needed for severe pain. 03/08/17   Ladell Pier, MD  sertraline (ZOLOFT) 25 MG tablet Take 1 tablet (25 mg total) by mouth daily. 03/08/17   Lavina Hamman, MD  sildenafil (VIAGRA) 100 MG tablet Take 100 mg by mouth daily as needed for erectile dysfunction. Reported on 06/17/2015    [provider]     Vital Signs: BP 117/71 (BP Location: Right Arm)   Pulse 66   Temp 99.3 F (37.4 C) (Oral)   Resp 20  SpO2 99%   Physical Exam awake, alert; biliary drain intact, output 1.3 liters, insertion site okay, mildly tender to palpation, no obvious leaking  Imaging: Dg Chest 2 View  Result Date: 03/05/2017 CLINICAL DATA:  Suspected sepsis.  Fever.  Active chemotherapy. EXAM: CHEST  2 VIEW COMPARISON:  Chest radiograph 01/06/2017 FINDINGS: Tip of the right chest port in the mid SVC. Improved lung aeration from prior exam. The heart is normal in size. No consolidation, pleural fluid or pulmonary edema. No pneumothorax. Biliary stent and biliary drain in the upper abdomen  partially included. IMPRESSION: No acute chest finding. Electronically Signed   By: Jeb Levering M.D.   On: 03/05/2017 04:14   Ir Exchange Biliary Drain  Result Date: 03/06/2017 INDICATION: History of metastatic colon cancer, post left hepatectomy with recurrent disease at the level of the biliary hilum. Patient has previously undergone endoscopic placement of an un-covered metal biliary stent on 10/15/2016 and subsequent placement of an additional internal plastic biliary stent on 12/16/2016. Unfortunately, the patient experienced persistent hyperbilirubinemia and as such, underwent percutaneous transhepatic biliary drainage catheter placement by Dr. Earleen Newport on 01/04/2017. Patient recently underwent fluoroscopic guided percutaneous cholangiogram and biliary drainage catheter up sizing on 02/09/2017. Unfortunately, the patient return to the was aligned emergency department last evening with symptoms worrisome for cholangitis as well as leaking around the previously capped biliary drainage catheter. As such, patient presents today for fluoroscopic guided biliary drainage catheter exchange EXAM: FLUOROSCOPIC GUIDED PERCUTANEOUS BILIARY DRAINAGE CATHETER EXCHANGE COMPARISON:  COMPARISON Fluoroscopic guided biliary drainage catheter exchange - 02/09/2017; biliary drainage catheter placement - 01/04/2017; CT abdomen pelvis - 12/14/2016; MRCP -12/24/2016 CONTRAST:  20 cc Omnipaque-300 administered into the collecting system FLUOROSCOPY TIME:  1 minute (25 mGy) COMPLICATIONS: None immediate. TECHNIQUE: Informed written consent was obtained from the patient after a discussion of the risks, benefits and alternatives to treatment. Questions regarding the procedure were encouraged and answered. A timeout was performed prior to the initiation of the procedure. The external portion of the existing right-sided percutaneous biliary drainage catheter as well as the surrounding skin were prepped and draped in the usual  sterile fashion. A sterile drape was applied covering the operative field. Maximum barrier sterile technique with sterile gowns and gloves were used for the procedure. A timeout was performed prior to the initiation of the procedure. A pre procedural spot fluoroscopic image was obtained after contrast was injected via the existing biliary drainage catheter. The existing biliary drainage catheter was cut and cannulated with a stiff Glidewire wire which was coiled within the proximal small bowel. Under intermittent fluoroscopic guidance, the existing PBD was exchanged for a new 12 [place] PBD. Small amount of contrast was injected via the exchanged biliary drainage catheter and a post exchange spot fluoroscopic image was obtained. The catheter was locked and secured to the skin with a single interrupted suture. The biliary drainage catheter was connected to a gravity bag. A dressing was placed. The patient tolerated the procedure well without immediate postprocedural complication. FINDINGS: Preprocedural spot fluoroscopic image demonstrates unchanged positioning of the percutaneous drainage catheter as well as the previously placed endoscopic biliary stent. Multiple surgical clips overlie the biliary hilum compatible with provided history of prior hepatectomy. The existing percutaneous biliary catheter is appropriately positioned and functioning. After successful fluoroscopic guided exchange, the new 12 French percutaneous biliary catheter is appropriately positioned with end coiled and locked within the proximal duodenum. IMPRESSION: Successful fluoroscopic guided exchange of right sided 12 French percutaneous biliary drainage catheter. Biliary drainage  catheter has now been connected to the gravity bag. PLAN: Given presence of advance metastatic disease on abdominal CT and MRCP performed in July of this year, would have a low threshold for repeat a contrast-enhanced CT of the chest, abdomen pelvis to evaluate for  disease progression. Electronically Signed   By: Sandi Mariscal M.D.   On: 03/06/2017 16:58    Labs:  CBC:  Recent Labs  03/05/17 0401 03/06/17 0500 03/07/17 0515 03/08/17 0500  WBC 8.6 3.8* 2.7* 1.9*  HGB 11.3* 9.8* 9.4* 9.1*  HCT 33.9* 28.7* 27.7* 26.4*  PLT 156 110* 111* 111*    COAGS:  Recent Labs  01/04/17 0930 02/09/17 1007 03/05/17 0401 03/06/17 0500  INR 1.00 1.22 1.18 1.21    BMP:  Recent Labs  03/05/17 1713 03/06/17 0500 03/07/17 0515 03/08/17 0500  NA 134* 133* 135 137  K 3.6 3.4* 3.0* 3.1*  CL 106 104 106 109  CO2 22 23 21* 22  GLUCOSE 108* 90 94 95  BUN 15 14 11 8   CALCIUM 8.0* 8.2* 8.2* 8.2*  CREATININE 1.12 1.07 1.06 0.98  GFRNONAA >60 >60 >60 >60  GFRAA >60 >60 >60 >60    LIVER FUNCTION TESTS:  Recent Labs  03/05/17 1713 03/06/17 0500 03/07/17 0515 03/08/17 0500  BILITOT 2.2* 1.9* 1.3* 0.9  AST 26 22 18 17   ALT 20 18 17  13*  ALKPHOS 114 112 85 77  PROT 6.9 7.1 6.8 6.5  ALBUMIN 2.8* 2.8* 2.6* 2.5*    Assessment and Plan: Patient with history of metastatic colon cancer to liver, biliary obstruction, cholangitis;status post exchange of right-sided 18 [place] biliary drainage catheter on 9/29; temp 99.3, blood cultures negative to date; potassium 3.1- replace, creatinine 0.98, total bilirubin 0.9,WBC 1.9, hemoglobin 9.1, platelets 111k; maintain biliary drain to gravity bag until patient has completed antibiotic therapy and if afebrile at that point may attempt to cap drain; patient to keep existing scheduled appt for biliary drain exchange on 04/06/17. He was instructed to flush drain once a day with 5 mL sterile normal saline. Do not aspirate during flushes. He should record drain output and change dressing as needed. Above was discussed with patient and wife. Call [phone] or [phone] with any questions.    Electronically Signed: D. Rowe Asa, PA-C 03/08/2017, 2:02 PM   I spent a total of 20 minutes at the the patient's  bedside AND on the patient's hospital floor or unit, greater than 50% of which was counseling/coordinating care for biliary drain    Patient ID: Bryan Fields, male   DOB: May 03, 1961, 56 y.o.   MRN: [id number]

## 2017-03-08 NOTE — Evaluation (Signed)
Physical Therapy Evaluation Patient Details Name: Bryan Fields MRN: [id number] DOB: December 13, 1960 Today's Date: 03/08/2017   History of Present Illness   JABARI SWOVELAND is a 56 y.o. 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 Biliary stricture S/P percutaneous drain placement, BPH, GERD, cancer-related pain  Clinical Impression  The patient is ambulating with family. Plans DC home. No further PT needs.    Follow Up Recommendations No PT follow up    Equipment Recommendations  None recommended by PT    Recommendations for Other Services       Precautions / Restrictions Precautions Precautions: Fall      Mobility  Bed Mobility Overal bed mobility: Modified Independent                Transfers Overall transfer level: Needs assistance Equipment used: None Transfers: Sit to/from Stand Sit to Stand: Supervision            Ambulation/Gait Ambulation/Gait assistance: Min guard Ambulation Distance (Feet): 250 Feet   Gait Pattern/deviations: Step-through pattern     General Gait Details: pushed IV pole, ambulated without IV pole, at times held onto rail. repors does better with shoes  Stairs            Wheelchair Mobility    Modified Rankin (Stroke Patients Only)       Balance                                             Pertinent Vitals/Pain Pain Assessment: Faces Faces Pain Scale: Hurts little more Pain Location: right side Pain Descriptors / Indicators: Dull;Discomfort Pain Intervention(s): Monitored during session    Home Living Family/patient expects to be discharged to:: Private residence Living Arrangements: Spouse/significant other Available Help at Discharge: Family Type of Home: House Home Access: Stairs to enter Entrance Stairs-Rails: Psychiatric nurse of Steps: 4 Home Layout: One level Home Equipment: Environmental consultant - 2  wheels      Prior Function                 Hand Dominance        Extremity/Trunk Assessment   Upper Extremity Assessment Upper Extremity Assessment: Generalized weakness    Lower Extremity Assessment Lower Extremity Assessment: Generalized weakness;RLE deficits/detail RLE Deficits / Details: transmet anputation    Cervical / Trunk Assessment Cervical / Trunk Assessment: Normal  Communication      Cognition Arousal/Alertness: Awake/alert Behavior During Therapy: WFL for tasks assessed/performed Overall Cognitive Status: Within Functional Limits for tasks assessed                                        General Comments      Exercises     Assessment/Plan    PT Assessment Patent does not need any further PT services  PT Problem List Decreased activity tolerance;Decreased mobility;Pain       PT Treatment Interventions      PT Goals (Current goals can be found in the Care Plan section)  Acute Rehab PT Goals Patient Stated Goal: to go home PT Goal Formulation: All assessment and education complete, DC therapy    Frequency     Barriers to discharge  Co-evaluation               AM-PAC PT "6 Clicks" Daily Activity  Outcome Measure Difficulty turning over in bed (including adjusting bedclothes, sheets and blankets)?: A Little Difficulty moving from lying on back to sitting on the side of the bed? : A Little Difficulty sitting down on and standing up from a chair with arms (e.g., wheelchair, bedside commode, etc,.)?: A Little Help needed moving to and from a bed to chair (including a wheelchair)?: A Little Help needed walking in hospital room?: A Little Help needed climbing 3-5 steps with a railing? : A Little 6 Click Score: 18    End of Session   Activity Tolerance: Patient tolerated treatment well Patient left: in bed;with call bell/phone within reach Nurse Communication: Mobility status PT Visit Diagnosis: Difficulty  in walking, not elsewhere classified (R26.2)    Time: 3888-2800 PT Time Calculation (min) (ACUTE ONLY): 13 min   Charges:   PT Evaluation $PT Eval Low Complexity: 1 Low     PT G CodesTresa Endo PT [phone]   Claretha Cooper 03/08/2017, 2:16 PM

## 2017-03-08 NOTE — Progress Notes (Signed)
IP PROGRESS NOTE  Subjective:   He was admitted 03/05/2017 with clinical evidence of bacteremia. He reports feeling better. No recurrent chills. He is ambulating and eating. He underwent biliary drain exchange on 03/06/2017.  Objective: Vital signs in last 24 hours: Blood pressure 117/71, pulse 66, temperature 99.3 F (37.4 C), temperature source Oral, resp. rate 20, SpO2 99 %.  Intake/Output from previous day: 09/30 0701 - 10/01 0700 In: 2650 [P.O.:240; I.V.:2400] Out: 1275 [Drains:1275]  Physical Exam:  HEENT: No thrush Lungs: Coarse rhonchi at the posterior bases bilaterally, no respiratory distress Cardiac: Regular rate and rhythm Abdomen: Firm masslike fullness at the upper abdomen near the medial aspect of the transverse incision. Biliary drain site with a gauze dressing Extremities: No leg edema   Portacath/PICC-without erythema  Lab Results:  Recent Labs  03/07/17 0515 03/08/17 0500  WBC 2.7* 1.9*  HGB 9.4* 9.1*  HCT 27.7* 26.4*  PLT 111* 111*    BMET  Recent Labs  03/07/17 0515 03/08/17 0500  NA 135 137  K 3.0* 3.1*  CL 106 109  CO2 21* 22  GLUCOSE 94 95  BUN 11 8  CREATININE 1.06 0.98  CALCIUM 8.2* 8.2*     Studies/Results: Ir Exchange Biliary Drain  Result Date: 03/06/2017 INDICATION: History of metastatic colon cancer, post left hepatectomy with recurrent disease at the level of the biliary hilum. Patient has previously undergone endoscopic placement of an un-covered metal biliary stent on 10/15/2016 and subsequent placement of an additional internal plastic biliary stent on 12/16/2016. Unfortunately, the patient experienced persistent hyperbilirubinemia and as such, underwent percutaneous transhepatic biliary drainage catheter placement by Dr. Earleen Newport on 01/04/2017. Patient recently underwent fluoroscopic guided percutaneous cholangiogram and biliary drainage catheter up sizing on 02/09/2017. Unfortunately, the patient return to the was aligned  emergency department last evening with symptoms worrisome for cholangitis as well as leaking around the previously capped biliary drainage catheter. As such, patient presents today for fluoroscopic guided biliary drainage catheter exchange EXAM: FLUOROSCOPIC GUIDED PERCUTANEOUS BILIARY DRAINAGE CATHETER EXCHANGE COMPARISON:  COMPARISON Fluoroscopic guided biliary drainage catheter exchange - 02/09/2017; biliary drainage catheter placement - 01/04/2017; CT abdomen pelvis - 12/14/2016; MRCP -12/24/2016 CONTRAST:  20 cc Omnipaque-300 administered into the collecting system FLUOROSCOPY TIME:  1 minute (25 mGy) COMPLICATIONS: None immediate. TECHNIQUE: Informed written consent was obtained from the patient after a discussion of the risks, benefits and alternatives to treatment. Questions regarding the procedure were encouraged and answered. A timeout was performed prior to the initiation of the procedure. The external portion of the existing right-sided percutaneous biliary drainage catheter as well as the surrounding skin were prepped and draped in the usual sterile fashion. A sterile drape was applied covering the operative field. Maximum barrier sterile technique with sterile gowns and gloves were used for the procedure. A timeout was performed prior to the initiation of the procedure. A pre procedural spot fluoroscopic image was obtained after contrast was injected via the existing biliary drainage catheter. The existing biliary drainage catheter was cut and cannulated with a stiff Glidewire wire which was coiled within the proximal small bowel. Under intermittent fluoroscopic guidance, the existing PBD was exchanged for a new 12 [place] PBD. Small amount of contrast was injected via the exchanged biliary drainage catheter and a post exchange spot fluoroscopic image was obtained. The catheter was locked and secured to the skin with a single interrupted suture. The biliary drainage catheter was connected to a gravity  bag. A dressing was placed. The patient tolerated the procedure  well without immediate postprocedural complication. FINDINGS: Preprocedural spot fluoroscopic image demonstrates unchanged positioning of the percutaneous drainage catheter as well as the previously placed endoscopic biliary stent. Multiple surgical clips overlie the biliary hilum compatible with provided history of prior hepatectomy. The existing percutaneous biliary catheter is appropriately positioned and functioning. After successful fluoroscopic guided exchange, the new 12 French percutaneous biliary catheter is appropriately positioned with end coiled and locked within the proximal duodenum. IMPRESSION: Successful fluoroscopic guided exchange of right sided 12 French percutaneous biliary drainage catheter. Biliary drainage catheter has now been connected to the gravity bag. PLAN: Given presence of advance metastatic disease on abdominal CT and MRCP performed in July of this year, would have a low threshold for repeat a contrast-enhanced CT of the chest, abdomen pelvis to evaluate for disease progression. Electronically Signed   By: Sandi Mariscal M.D.   On: 03/06/2017 16:58    Medications: I have reviewed the patient's current medications.  Assessment/Plan: 1. 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 Microsatellite stable; preserved expression of major and minor MMR proteins. He began treatment with FOLFOX in December 2011. He completed cycle #12 on 10/27/2010. Oxaliplatin was held with cycles number 7 through 10 and resumed with cycle #11. 1. History of neutropenia secondary to chemotherapy. 2. History of thrombocytopenia secondary chemotherapy. 3. He did not undergo a preoperative colonoscopy. He underwent a colonoscopy on 12/19/2010 with findings of a 1 cm polyp at 90 cm from the anal verge, minimal polyp at 30 cm from the anal  verge and minimal diverticulosis. Colonoscopy 03/18/2012-negative. 4. 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 He began study drug on 05/13/2010. 5. Status post Port-A-Cath placement. The Port-A-Cath has been removed. 6. 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 8. Low attenuation lesion in the caudate lobe of the liver on the CT 04/11/2012-? Cyst. MRI liver on 04/20/2012 favored the caudate lobe liver lesion to represent a cyst or complex cyst. CT abdomen/pelvis 04/17/2013 with continued enlargement of hypovascular lesion in the caudate lobe of the liver.  PET scan 10/27/2011 confirmed a hypermetabolic caudate lobe liver lesion, tiny indeterminate lung nodules, and a 9 mm level II left neck node   CT biopsy of the liver lesion 11/07/2013 confirmed adenocarcinoma  Left liver and caudate resection 01/15/2014-pathology confirmed metastatic colon cancer, and negative surgical margins  Surveillance CT scans 07/24/2014 consistent with recurrent disease involving upper abdominal lymph nodes, peritoneal implants, and an anterior abdominal wall mass  Status post biopsy of the anterior abdominal wall mass 08/06/2014 with pathology showing metastatic adenocarcinoma consistent with a colon primary.  [id number] 1/28  Cycle 1 FOLFIRI/Avastin on the [facility] 1317 08/20/2014  Cycle 2 FOLFIRI/Avastin 09/03/2014  Cycle 3 held on 09/17/2014 due to neutropenia  Cycle 3 FOLFIRI/Avastin 09/25/2014. Neulasta added beginning with cycle 3.  Cycle 4 FOLFIRI/Avastin 10/08/2014  Restaging CT scans 10/18/2014 with slight interval increase in the size of the soft tissue mass in the subcutaneous fat of the epigastric region. Underlying peritoneal implants, probable focus of residual disease along the resected margin of the liver and hepatoduodenal  ligament lymphadenopathy all appeared slightly improved. No new sites of metastatic disease otherwise noted. New left lower lobe bronchopneumonia.  Cycle 5 FOLFIRI/Avastin 10/22/2014  Cycle 6 FOLFIRI/Avastin 11/06/2014  Cycle 7 FOLFIRI Avastin 11/19/2014  Cycle  8 FOLFIRI/Avastin 12/03/2014  CT 12/14/2014 with stable disease  Cycle 9 FOLFIRI/Avastin 12/17/2014  Cycle 10 FOLFIRI/Avastin 01/07/2015 (5-fluorouracil and leucovorin dose reduced)  Cycle 11 FOLFIRI/Avastin 01/21/2015  Cycle 12 FOLFIRI/Avastin 02/04/2015  CT 02/13/2015 with stable disease  Cycle 13 FOLFIRI/Avastin 02/25/2015-changed to an every three-week protocol, now off study  Avastin held with cycle 14 FOLFIRI on 03/25/2015 secondary to gingivitis  Restaging CTs 05/20/2015-slight increase in the size of the epigastric subcutaneous mass, stable peritoneal implants  FOLFIRI continued on a 3 week schedule  Avastin resumed 06/17/2015  CTs 12/23/2015-stable subxiphoid mass, stable low-attenuation mass adjacent to the left liver surgical margin, peritoneal lesions-stable  FOLFIRI continued on a 4 week schedule.  Avastin placed on hold 01/06/2016  FOLFIRI discontinued 03/02/2016 secondary to enlargement of the abdominal wall mass with new superficial fungating nodules involving the skin  Palliative radiation to the abdominal wall mass completed 03/20/2016  Cycle 1 FOLFOX12/27/2017  Cycle 2 CAPOX 06/22/2016  Clinical progression 07/07/2016 and allergic reaction to oxaliplatin 06/23/2015, CAPOX discontinued  Resection of the main abdominal wall mass and an adjacent peritoneal mass on 07/23/2016 with the pathology confirming metastatic colon cancer  [facility]aging CTs at [facility] 09/18/2016-new liver metastasis, progressive soft tissue masses at the liver resection margin, new mass adjacent to the chest wall resection site, increase in size of peritoneal nodules  CT abdomen/pelvis 10/02/2016-interval progression of  periportal adenopathy and recurrence along the hepatic resection margin. New hepatic program lesion superior right hepatic lobe. Interval increase in size of peritoneal implants left upper quadrant. Enlargement of anabdominal wall mass along the right rectus muscle.  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 This stricture was stented with a 6cm long, uncovered 72m diameter metal bile duct stent in good position.   Initiation of Lonsurf cycle 1 11/17/2016  MRI abdomen 12/24/2016-mild to moderate intrahepatic biliary ductal dilatation; stent in the common bile duct appears grossly patent; 3 intrahepatic metastases, extensive lymphadenopathy hepatoduodenal ligament and gastrohepatic ligament, multiple peritoneal implants in the upper abdomen and soft tissue metastases in the upper anterior abdominal wall musculature and overlying subcutaneous fat on the right side.  Cycle 2 Lonsurf 01/18/2017   Cycle 3 Lonsurf 02/24/2017 dose reduced 10. Iron deposition within the liver noted on MRI 04/20/2012. The ferritin level was normal on 04/17/2013. Increased iron deposition noted on the left liver resection 01/15/2014 11. Pain/bleeding at the anus reported when here 04/24/2013. Status post evaluation by Dr. NLucia Gaskinswith findings of an anal fissure. 12. Right face cystic lesion 13. Status post Port-A-Cath placement 08/14/2014 14. Delayed nausea following FOLFIRI, prophylactic Decadron added with cycle 2 with improvement. 15. Chest CT 10/18/2014 with new left lower lobe bronchopneumonia. Levaquin initiated. Resolved on CT 12/14/2014 16. Gum pain-mucositis?, Gingivitis?-Improved with discontinuation of Avastin 17. Pain/tenderness, mild erythema and full appearance over the maxillary sinuses. Maxillofacial CT 01/30/2015 with mild to moderate bilateral maxillary and sphenoid sinus inflammatory changes. Multifocal right  maxillary dental periapical lucency. Status post a right upper tooth extraction with resolution of the pain. 18. Admission 10/21/2016 with nausea/vomiting-x-ray evidence for a partial small bowel obstruction, clinical improvement with bowel rest, recurrent obstructive symptoms during the week of 11/30/2016 19. Admission 12/08/2016 with a high fever-Klebsiella bacteremia confirmed, likely a biliary source  20. Mild neutropenia and anemia/thrombocytopenia secondary to chemotherapy-the platelet count and white count have recovered during this hospital admission  21. Biliary obstruction-CT 12/14/2016 revealed intrahepatic biliary dilatation despite a common bile duct stent distended  gallbladder  Nuclear medicine biliary scan 12/16/2016-poor hepatic uptake with nonvisualization of the bile ducts consistent with biliary obstruction  ERCP 12/16/2016 confirmed intrahepatic biliary dilatation, status post placement of a new bile duct stent  MRI abdomen 12/24/2016-mild to moderate intrahepatic biliary ductal dilatation; stent in the common bile duct appears grossly patent; 3 intrahepatic metastases, extensive lymphadenopathy hepatoduodenal ligament and gastrohepatic ligament, multiple peritoneal implants in the upper abdomen and soft tissue metastases in the upper anterior abdominal wall musculature and overlying subcutaneous fat on the right side.  ERCP 01/04/2017-plastic biliary stent removed, metal stent left in place, portal hypertensive changes noted  Percutaneous transhepatic cholangiogram 01/04/2017 confirmed complete occlusion of the metal stent with proximal dilation of bile ducts, status post placement of an internal/external biliary drain  02/09/2017-biliary drain exchanged and capped fora trial of  internalization.  22. Fever following the 01/04/2017 ERCP procedure-biliary drainage positive for Staphylococcus hemolyticus  23. Pain secondary to metastatic colon cancer.MS Contin/Dilaudid  24.  Admission 03/05/2017 with clinical evidence of bacteremia and biliary obstruction  Status post exchange of the percutaneous biliary drain 03/06/2017  Mr. Curci appears improved compared to hospital admission 03/05/2017. The bilirubin has normalized and the high fever/chills have resolved. Blood cultures from 03/05/2017 remained negative. He has mild neutropenia and thrombocytopenia today. This is most likely secondary to bacteremia as opposed to [place] taken prior to admission.  He appears stable for discharge from an oncology standpoint. We discussed his depression symptoms. I offered to start him on an antidepressant. He would like to continue as needed Valium for now. We will reassess this when he returns for an office visit next week.  Recommendations:  1. Complete outpatient course of oral antibiotics 2. Hold Lonsurf with the mild neutropenia and recent bacteremia 3. A follow-up appointment will be scheduled at the [place] for 03/16/2017 4. We will refill his prescription for Dilaudid   LOS: 3 days   Donneta Romberg, MD   03/08/2017, 8:50 AM

## 2017-03-08 NOTE — Progress Notes (Signed)
Discharge instructions explained to pt and wife. Some dressing supplies given to wife. Dressing not changed. Wife states she is going to give pt a shower when they get home and she will flush bilairy drain and change dressing. States understanding the instructions(wife)

## 2017-03-09 ENCOUNTER — Telehealth: Payer: Self-pay

## 2017-03-09 NOTE — Discharge Summary (Addendum)
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)   Stricture of bile duct   Pancytopenia ([place])   Biliary tract infection   Admitted From: home Disposition:  home  Recommendations for Outpatient Follow-up:  1. Please follow up with oncology   Follow-up Information    Ladell Pier, MD. Schedule an appointment as soon as possible for a visit.   Specialty:  Oncology Contact information: [place] [address] [phone]          Diet recommendation: regular diet  Activity: The patient is advised to gradually reintroduce usual activities.  Discharge Condition: good  Code Status: DNR DNI  History of present illness: As per the H and P dictated on admission, "Bryan Fields is a 56 y.o. 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 Biliary stricture S/P percutaneous drain placement, BPH, GERD, cancer-related pain. Patient presented with complaints of fever with chills starting this morning. Patient was at his baseline yesterday. Was eating okay and no nausea no vomiting no diarrhea. This morning he started having fever with chills. He also noted that there was increased drainage from his percutaneous drain. With this the patient was actually seen in the ER. All the workup was unremarkable and based on discussion with on-call oncology the patient was sent to the clinic for further input. While at the clinic patient continues to have fever with chills and patient was given IV fluid bolus as well as IV Zosyn and patient was referred for direct admit. At the time of my evaluation  denies having any nausea and no vomiting. No abdominal pain. No diarrhea or constipation. Remains compliant with all his medication regimen. Still has some chills. Was feeling warm to touch."  Hospital Course:  Summary of his active problems in the hospital is as following. 1. Suspected cholangitis. Patient presents with fever chills has a history of biliary drainage. Has also history of infection of the biliary drain. At present suspected cholangitis. Prior culture has grown Klebsiella in blood and Streptococcus hemolyticus in by. Therefore patient was empirically cover with vancomycin and Zosyn. Blood cultures performed in the ER , no growth for 48 hrs. IR consulted for biliary drain exchange tolerated procedure well. We will transition him to oral Cipro and doxy.  2.Stage IV colon cancer. Goals of care discussion. Patient wants to change his CODE STATUS from full code to DNR/DNI for now. He also has a living will which also seems to seen. Patient has cancer-related pain and will continue with Dilaudid. Chemotherapy is currently on hold. After This Treatment of This Acute Event Is to Consider Hospice As an Option per Patient.  3.GERD. Continue PPI.  4.Essential hypertension. Reducing nadolol due to normal blood pressure despite holding it.   5.Muscle spasm. Continue Valium per home regimen.  6. Chemotherapy induced pancytopenia Pt will have outpatient follow up with oncology.  All other chronic medical condition were stable during the hospitalization.  Patient was seen by physical therapy, who recommended no PT follow up needed On the day of the discharge the patient's vitals were stable, and no other acute medical condition were  reported by patient. the patient was felt safe to be discharge at home with family.  Procedures and Results:  IR billiary drain exchange   Consultations:  IR  oncology  DISCHARGE MEDICATION: Discharge Medication List as of 03/08/2017   3:07 PM    START taking these medications   Details  ciprofloxacin (CIPRO) 500 MG tablet Take 1 tablet (500 mg total) by mouth 2 (two) times daily., Starting Mon 03/08/2017, Until Fri 03/12/2017, Normal    doxycycline (VIBRA-TABS) 100 MG tablet Take 1 tablet (100 mg total) by mouth every 12 (twelve) hours., Starting Mon 03/08/2017, Until Fri 03/12/2017, Normal    sertraline (ZOLOFT) 25 MG tablet Take 1 tablet (25 mg total) by mouth daily., Starting Mon 03/08/2017, Normal      CONTINUE these medications which have NOT CHANGED   Details  acetaminophen (TYLENOL) 325 MG tablet Take 650 mg by mouth every 6 (six) hours as needed for moderate pain or fever., Historical Med    Cyanocobalamin (VITAMIN B-12) 2500 MCG SUBL Place 1 tablet under the tongue daily., Historical Med    diazepam (VALIUM) 5 MG tablet Take 1 tablet (5 mg total) by mouth 3 (three) times daily as needed for muscle spasms., Starting Mon 02/01/2017, Print    HYDROmorphone (DILAUDID) 4 MG tablet Take 1-2 tablets (4-8 mg total) by mouth every 4 (four) hours as needed for severe pain., Starting Mon 03/08/2017, Print    ibuprofen (ADVIL,MOTRIN) 200 MG tablet Take 600 mg by mouth every 6 (six) hours as needed for mild pain. , Historical Med    lidocaine-prilocaine (EMLA) cream Apply 1 application topically as needed (apply to port)., Historical Med    metoCLOPramide (REGLAN) 10 MG tablet Take 1 tablet (10 mg total) by mouth 3 (three) times daily before meals., Starting Thu 02/04/2017, Until Sat 03/06/2017, Normal    morphine (MS CONTIN) 30 MG 12 hr tablet Take 1 tablet (30 mg total) by mouth every 12 (twelve) hours., Starting Mon 02/01/2017, Print    ondansetron (ZOFRAN) 8 MG tablet Take 1 tablet (8 mg total) by mouth every 8 (eight) hours as needed for nausea or vomiting., Starting Fri 11/27/2016, Normal    pantoprazole (PROTONIX) 40 MG tablet Take 1 tablet (40 mg total) by mouth every 12 (twelve) hours., Starting Mon 02/01/2017, Normal      polyethylene glycol (MIRALAX / GLYCOLAX) packet Take 17 g by mouth daily., Historical Med    sildenafil (VIAGRA) 100 MG tablet Take 100 mg by mouth daily as needed for erectile dysfunction. Reported on 06/17/2015, Historical Med      STOP taking these medications     nadolol (CORGARD) 80 MG tablet      trifluridine-tipiracil (LONSURF) 20-8.19 MG tablet        Allergies  Allergen Reactions  . Other     Chemo therapy drug: Oxali Caused itching and redness.    Discharge Instructions    Diet general    Complete by:  As directed    Discharge instructions    Complete by:  As directed    It is important that you read following instructions as well as go over your medication list with RN to help you understand your care after this hospitalization.  Discharge Instructions: Please follow-up with PCP in one week  Please request your primary care physician to go over all Hospital Tests and Procedure/Radiological results at the follow up,  Please get all Hospital records sent to your PCP by signing hospital release before you go home.  Do not drive, operating heavy machinery, perform activities at heights, swimming or participation in water activities or provide baby sitting services while you are on Pain, Sleep and Anxiety Medications; until you have been seen by Primary Care Physician or a Neurologist and advised to do so again. Do not take more than prescribed Pain, Sleep and Anxiety Medications. You were cared for by a hospitalist during your hospital stay. If you have any questions about your discharge medications or the care you received while you were in the hospital after you are discharged, you can call the unit and ask to speak with the hospitalist on call if the hospitalist that took care of you is not available.  Once you are discharged, your primary care physician will handle any further medical issues. Please note that NO REFILLS for any discharge medications will be authorized  once you are discharged, as it is imperative that you return to your primary care physician (or establish a relationship with a primary care physician if you do not have one) for your aftercare needs so that they can reassess your need for medications and monitor your lab values. You Must read complete instructions/literature along with all the possible adverse reactions/side effects for all the Medicines you take and that have been prescribed to you. Take any new Medicines after you have completely understood and accept all the possible adverse reactions/side effects.   Increase activity slowly    Complete by:  As directed      Discharge Exam: There were no vitals filed for this visit. Vitals:   03/08/17 0626 03/08/17 1410  BP: 117/71 135/73  Pulse: 66 66  Resp: 20 18  Temp: 99.3 F (37.4 C) 98.9 F (37.2 C)  SpO2: 99% 100%   General: Appear in no distress, no Rash; Oral Mucosa moist. Cardiovascular: S1 and S2 Present, no Murmur, no JVD Respiratory: Bilateral Air entry present and Clear to Auscultation, no Crackles, no wheezes Abdomen: Bowel Sound pesent, Soft and no tenderness Extremities: no Pedal edema, no calf tenderness Neurology: Grossly no focal neuro deficit.  The results of significant diagnostics from this hospitalization (including imaging, microbiology, ancillary and laboratory) are listed below for reference.    Significant Diagnostic Studies: Dg Chest 2 View  Result Date: 03/05/2017 CLINICAL DATA:  Suspected sepsis.  Fever.  Active chemotherapy. EXAM: CHEST  2 VIEW COMPARISON:  Chest radiograph 01/06/2017 FINDINGS: Tip of the right chest port in the mid SVC. Improved lung aeration from prior exam. The heart is normal in size. No consolidation, pleural fluid or pulmonary edema. No pneumothorax. Biliary stent and biliary drain in the upper abdomen partially included. IMPRESSION: No acute chest finding. Electronically Signed   By: Jeb Levering M.D.   On: 03/05/2017 04:14    Ir Exchange Biliary Drain  Result Date: 03/06/2017 INDICATION: History of metastatic colon cancer, post left hepatectomy with recurrent disease at the level of the biliary hilum. Patient has previously undergone endoscopic placement of an un-covered metal biliary stent on 10/15/2016 and subsequent placement of an additional internal plastic biliary stent on 12/16/2016. Unfortunately, the patient experienced persistent hyperbilirubinemia and as such, underwent percutaneous transhepatic biliary drainage catheter placement by Dr. Earleen Newport on 01/04/2017. Patient recently underwent fluoroscopic guided percutaneous cholangiogram and biliary drainage catheter up sizing on 02/09/2017. Unfortunately, the patient return to the was aligned emergency department last evening with symptoms worrisome for cholangitis as well as leaking around the previously capped biliary drainage catheter. As such, patient presents today for fluoroscopic guided biliary  drainage catheter exchange EXAM: FLUOROSCOPIC GUIDED PERCUTANEOUS BILIARY DRAINAGE CATHETER EXCHANGE COMPARISON:  COMPARISON Fluoroscopic guided biliary drainage catheter exchange - 02/09/2017; biliary drainage catheter placement - 01/04/2017; CT abdomen pelvis - 12/14/2016; MRCP -12/24/2016 CONTRAST:  20 cc Omnipaque-300 administered into the collecting system FLUOROSCOPY TIME:  1 minute (25 mGy) COMPLICATIONS: None immediate. TECHNIQUE: Informed written consent was obtained from the patient after a discussion of the risks, benefits and alternatives to treatment. Questions regarding the procedure were encouraged and answered. A timeout was performed prior to the initiation of the procedure. The external portion of the existing right-sided percutaneous biliary drainage catheter as well as the surrounding skin were prepped and draped in the usual sterile fashion. A sterile drape was applied covering the operative field. Maximum barrier sterile technique with sterile gowns and  gloves were used for the procedure. A timeout was performed prior to the initiation of the procedure. A pre procedural spot fluoroscopic image was obtained after contrast was injected via the existing biliary drainage catheter. The existing biliary drainage catheter was cut and cannulated with a stiff Glidewire wire which was coiled within the proximal small bowel. Under intermittent fluoroscopic guidance, the existing PBD was exchanged for a new 12 [place] PBD. Small amount of contrast was injected via the exchanged biliary drainage catheter and a post exchange spot fluoroscopic image was obtained. The catheter was locked and secured to the skin with a single interrupted suture. The biliary drainage catheter was connected to a gravity bag. A dressing was placed. The patient tolerated the procedure well without immediate postprocedural complication. FINDINGS: Preprocedural spot fluoroscopic image demonstrates unchanged positioning of the percutaneous drainage catheter as well as the previously placed endoscopic biliary stent. Multiple surgical clips overlie the biliary hilum compatible with provided history of prior hepatectomy. The existing percutaneous biliary catheter is appropriately positioned and functioning. After successful fluoroscopic guided exchange, the new 12 French percutaneous biliary catheter is appropriately positioned with end coiled and locked within the proximal duodenum. IMPRESSION: Successful fluoroscopic guided exchange of right sided 12 French percutaneous biliary drainage catheter. Biliary drainage catheter has now been connected to the gravity bag. PLAN: Given presence of advance metastatic disease on abdominal CT and MRCP performed in July of this year, would have a low threshold for repeat a contrast-enhanced CT of the chest, abdomen pelvis to evaluate for disease progression. Electronically Signed   By: Sandi Mariscal M.D.   On: 03/06/2017 16:58   Ir Exchange Biliary Drain  Result Date:  02/09/2017 INDICATION: History of metastatic colon cancer, post left hepatectomy with recurrent disease at the level of the biliary hilum. Patient has previously undergone endoscopic placement of an un-covered metal biliary stent on 10/15/2016 and subsequent placement of an additional internal plastic biliary stent on 12/16/2016. Unfortunately, the patient experienced persistent hyperbilirubinemia and as such, underwent percutaneous transhepatic biliary drainage catheter placement by Dr. Earleen Newport on 01/04/2017 with subsequent percutaneous cholangiogram performed on 01/15/2017 Patient has completed his subsequent course of chemotherapy and presents today for fluoroscopic guided cholangiogram and biliary drainage catheter exchange/up sizing. EXAM: FLUOROSCOPIC GUIDED RIGHT-SIDED PERCUTANEOUS BILIARY DRAINAGE CATHETER COMPARISON:  COMPARISON Percutaneous cholangiogram via existing biliary drainage catheter - 01/15/2017; ultrasound fluoroscopic guided biliary drainage catheter placement - 01/04/2017; MRCP - 12/24/2016 CONTRAST:  10 cc Isovue 300 administered into the collecting system FLUOROSCOPY TIME:  6 seconds (note, this procedure was performed with the use of a C-arm secondary to malfunction of the angiography unit). COMPLICATIONS: None immediate. TECHNIQUE: Informed written consent was obtained from the patient after  a discussion of the risks, benefits and alternatives to treatment. Questions regarding the procedure were encouraged and answered. A timeout was performed prior to the initiation of the procedure. The external portion of the existing right-sided percutaneous biliary drainage catheter as well as the surrounding skin were prepped and draped in the usual sterile fashion. A sterile drape was applied covering the operative field. Maximum barrier sterile technique with sterile gowns and gloves were used for the procedure. A timeout was performed prior to the initiation of the procedure. A pre procedural spot  fluoroscopic image was obtained after contrast was injected via the existing biliary drainage catheter. The existing biliary drainage catheter was cut and cannulated with a Amplatz wire which was coiled within the proximal small bowel. Under intermittent fluoroscopic guidance, the existing PBD was exchanged for a new, slightly larger, now 12 [place] PBD. Small amount of contrast was injected via the exchanged biliary drainage catheter and a post exchange spot fluoroscopic image was obtained. The catheter was locked and secured to the skin with a single interrupted suture. The biliary drainage catheter was capped. A dressing was placed. The patient tolerated the procedure well without immediate postprocedural complication. FINDINGS: The existing percutaneous biliary catheter is appropriately positioned and functioning. After successful fluoroscopic guided exchange, the new, slightly larger, now 12 [place] percutaneous biliary catheter is appropriately positioned with end coiled and locked within the proximal duodenum. IMPRESSION: Successful fluoroscopic guided exchange of right sided 12 French percutaneous biliary drainage catheter. PLAN: - The biliary drainage catheter was capped for a trial of internalization. The patient was given an extra gravity bag and instructed to connect the biliary drainage catheter to the gravity bag if he were to experience excessive pericatheter leakage and/or recurrence of his jaundice or scleral icterus. The patient and the patient's wife demonstrated excellent understanding of this conversation. - Otherwise, the patient return to the interventional radiology suite for fluoroscopic guided biliary drainage catheter exchange in 8 weeks. Electronically Signed   By: Sandi Mariscal M.D.   On: 02/09/2017 12:56    Microbiology: Recent Results (from the past 240 hour(s))  Culture, blood (Routine x 2)     Status: None (Preliminary result)   Collection Time: 03/05/17  4:01 AM  Result Value Ref  Range Status   Specimen Description BLOOD RIGHT ANTECUBITAL  Final   Special Requests   Final    BOTTLES DRAWN AEROBIC AND ANAEROBIC Blood Culture adequate volume   Culture   Final    NO GROWTH 4 DAYS Performed at [facility], [address] [place], [place] [address]    Report Status PENDING  Incomplete  Culture, blood (Routine x 2)     Status: None (Preliminary result)   Collection Time: 03/05/17  4:02 AM  Result Value Ref Range Status   Specimen Description BLOOD LEFT HAND  Final   Special Requests   Final    BOTTLES DRAWN AEROBIC AND ANAEROBIC Blood Culture adequate volume   Culture   Final    NO GROWTH 4 DAYS Performed at [facility], [place] [address], [place], [place] [address]    Report Status PENDING  Incomplete     Labs: CBC:  Recent Labs Lab 03/05/17 0401 03/06/17 0500 03/07/17 0515 03/08/17 0500  WBC 8.6 3.8* 2.7* 1.9*  NEUTROABS 7.3 2.5 1.8 1.0*  HGB 11.3* 9.8* 9.4* 9.1*  HCT 33.9* 28.7* 27.7* 26.4*  MCV 99.1 98.6 97.5 96.4  PLT 156 110* 111* 865*   Basic Metabolic Panel:  Recent Labs Lab 03/05/17 0401 03/05/17 1713 03/06/17 0500 03/07/17 0515 03/08/17 0500  NA 132* 134* 133* 135 137  K 3.5 3.6 3.4* 3.0* 3.1*  CL 102 106 104 106 109  CO2 21* 22 23 21* 22  GLUCOSE 125* 108* 90 94 95  BUN 14 15 14 11 8   CREATININE 1.33* 1.12 1.07 1.06 0.98  CALCIUM 8.6* 8.0* 8.2* 8.2* 8.2*  MG  --  1.6*  --  1.7 1.7   Liver Function Tests:  Recent Labs Lab 03/05/17 0401 03/05/17 1713 03/06/17 0500 03/07/17 0515 03/08/17 0500  AST 34 26 22 18 17   ALT 23 20 18 17  13*  ALKPHOS 160* 114 112 85 77  BILITOT 2.3* 2.2* 1.9* 1.3* 0.9  PROT 7.9 6.9 7.1 6.8 6.5  ALBUMIN 3.3* 2.8* 2.8* 2.6* 2.5*   Time spent: 35 minutes  Signed:  Eder Macek  [facility] 03/08/2017 , 11:06 PM

## 2017-03-09 NOTE — Telephone Encounter (Signed)
Spoke with patient wife concerning upcoming appointment. Due to meetings lab is unavailable until after providers visit. Per 10/2 updates.

## 2017-03-10 ENCOUNTER — Other Ambulatory Visit: Payer: Self-pay | Admitting: *Deleted

## 2017-03-10 LAB — CULTURE, BLOOD (ROUTINE X 2)
CULTURE: NO GROWTH
CULTURE: NO GROWTH
SPECIAL REQUESTS: ADEQUATE
Special Requests: ADEQUATE

## 2017-03-15 ENCOUNTER — Encounter (HOSPITAL_COMMUNITY): Payer: Self-pay | Admitting: Emergency Medicine

## 2017-03-15 ENCOUNTER — Inpatient Hospital Stay (HOSPITAL_COMMUNITY)
Admission: EM | Admit: 2017-03-15 | Discharge: 2017-03-17 | DRG: 444 | Disposition: A | Discharge status: Home / Self Care | Payer: Medicare Other | Attending: Family Medicine | Admitting: Family Medicine

## 2017-03-15 ENCOUNTER — Telehealth: Payer: Self-pay | Admitting: General Surgery

## 2017-03-15 ENCOUNTER — Inpatient Hospital Stay (HOSPITAL_COMMUNITY): Payer: Medicare Other

## 2017-03-15 ENCOUNTER — Other Ambulatory Visit: Payer: Self-pay

## 2017-03-15 DIAGNOSIS — G893 Neoplasm related pain (acute) (chronic): Secondary | ICD-10-CM | POA: Diagnosis present

## 2017-03-15 DIAGNOSIS — C787 Secondary malignant neoplasm of liver and intrahepatic bile duct: Secondary | ICD-10-CM | POA: Diagnosis present

## 2017-03-15 DIAGNOSIS — Z8249 Family history of ischemic heart disease and other diseases of the circulatory system: Secondary | ICD-10-CM

## 2017-03-15 DIAGNOSIS — K8309 Other cholangitis: Principal | ICD-10-CM | POA: Diagnosis present

## 2017-03-15 DIAGNOSIS — C189 Malignant neoplasm of colon, unspecified: Secondary | ICD-10-CM | POA: Diagnosis present

## 2017-03-15 DIAGNOSIS — D61810 Antineoplastic chemotherapy induced pancytopenia: Secondary | ICD-10-CM | POA: Diagnosis present

## 2017-03-15 DIAGNOSIS — G620 Drug-induced polyneuropathy: Secondary | ICD-10-CM | POA: Diagnosis present

## 2017-03-15 DIAGNOSIS — D61818 Other pancytopenia: Secondary | ICD-10-CM | POA: Diagnosis not present

## 2017-03-15 DIAGNOSIS — K831 Obstruction of bile duct: Secondary | ICD-10-CM | POA: Diagnosis present

## 2017-03-15 DIAGNOSIS — K219 Gastro-esophageal reflux disease without esophagitis: Secondary | ICD-10-CM | POA: Diagnosis not present

## 2017-03-15 DIAGNOSIS — Z85038 Personal history of other malignant neoplasm of large intestine: Secondary | ICD-10-CM | POA: Diagnosis not present

## 2017-03-15 DIAGNOSIS — Z808 Family history of malignant neoplasm of other organs or systems: Secondary | ICD-10-CM

## 2017-03-15 DIAGNOSIS — R109 Unspecified abdominal pain: Secondary | ICD-10-CM | POA: Diagnosis not present

## 2017-03-15 DIAGNOSIS — Z9049 Acquired absence of other specified parts of digestive tract: Secondary | ICD-10-CM | POA: Diagnosis not present

## 2017-03-15 DIAGNOSIS — Z87891 Personal history of nicotine dependence: Secondary | ICD-10-CM

## 2017-03-15 DIAGNOSIS — T451X5A Adverse effect of antineoplastic and immunosuppressive drugs, initial encounter: Secondary | ICD-10-CM | POA: Diagnosis present

## 2017-03-15 DIAGNOSIS — C799 Secondary malignant neoplasm of unspecified site: Secondary | ICD-10-CM | POA: Diagnosis not present

## 2017-03-15 DIAGNOSIS — C182 Malignant neoplasm of ascending colon: Secondary | ICD-10-CM | POA: Diagnosis not present

## 2017-03-15 DIAGNOSIS — I10 Essential (primary) hypertension: Secondary | ICD-10-CM | POA: Diagnosis present

## 2017-03-15 HISTORY — PX: IR CHOLANGIOGRAM EXISTING TUBE: IMG6040

## 2017-03-15 LAB — CBC WITH DIFFERENTIAL/PLATELET
Basophils Absolute: 0 10*3/uL (ref 0.0–0.1)
Basophils Relative: 0 %
EOS ABS: 0.5 10*3/uL (ref 0.0–0.7)
Eosinophils Relative: 10 %
HEMATOCRIT: 34.3 % — AB (ref 39.0–52.0)
Hemoglobin: 11.5 g/dL — ABNORMAL LOW (ref 13.0–17.0)
LYMPHS ABS: 0.8 10*3/uL (ref 0.7–4.0)
Lymphocytes Relative: 16 %
MCH: 33.4 pg (ref 26.0–34.0)
MCHC: 33.5 g/dL (ref 30.0–36.0)
MCV: 99.7 fL (ref 78.0–100.0)
MONO ABS: 0.5 10*3/uL (ref 0.1–1.0)
MONOS PCT: 10 %
NEUTROS PCT: 64 %
Neutro Abs: 3.3 10*3/uL (ref 1.7–7.7)
Platelets: 134 10*3/uL — ABNORMAL LOW (ref 150–400)
RBC: 3.44 MIL/uL — ABNORMAL LOW (ref 4.22–5.81)
RDW: 15.6 % — ABNORMAL HIGH (ref 11.5–15.5)
WBC: 5.2 10*3/uL (ref 4.0–10.5)

## 2017-03-15 LAB — COMPREHENSIVE METABOLIC PANEL
ALK PHOS: 126 U/L (ref 38–126)
ALT: 54 U/L (ref 17–63)
ANION GAP: 8 (ref 5–15)
AST: 72 U/L — ABNORMAL HIGH (ref 15–41)
Albumin: 3.4 g/dL — ABNORMAL LOW (ref 3.5–5.0)
BILIRUBIN TOTAL: 5.8 mg/dL — AB (ref 0.3–1.2)
BUN: 15 mg/dL (ref 6–20)
CALCIUM: 9.4 mg/dL (ref 8.9–10.3)
CO2: 26 mmol/L (ref 22–32)
Chloride: 104 mmol/L (ref 101–111)
Creatinine, Ser: 1.34 mg/dL — ABNORMAL HIGH (ref 0.61–1.24)
GFR calc non Af Amer: 57 mL/min — ABNORMAL LOW (ref >60)
Glucose, Bld: 108 mg/dL — ABNORMAL HIGH (ref 65–99)
Potassium: 3.9 mmol/L (ref 3.5–5.1)
SODIUM: 138 mmol/L (ref 135–145)
TOTAL PROTEIN: 8.2 g/dL — AB (ref 6.5–8.1)

## 2017-03-15 LAB — PROTIME-INR
INR: 1.31
Prothrombin Time: 16.1 seconds — ABNORMAL HIGH (ref 11.4–15.2)

## 2017-03-15 LAB — I-STAT CG4 LACTIC ACID, ED: LACTIC ACID, VENOUS: 1.54 mmol/L (ref 0.5–1.9)

## 2017-03-15 MED ORDER — ONDANSETRON HCL 4 MG/2ML IJ SOLN: 4.0000 mg | Freq: Four times a day (QID) | INTRAMUSCULAR | Status: DC | PRN

## 2017-03-15 MED ORDER — LIDOCAINE-PRILOCAINE 2.5-2.5 % EX CREA: 1.0000 "application " | TOPICAL_CREAM | CUTANEOUS | Status: DC | PRN

## 2017-03-15 MED ORDER — IOPAMIDOL (ISOVUE-300) INJECTION 61%
INTRAVENOUS | Status: AC
  Filled 2017-03-15: qty 50

## 2017-03-15 MED ORDER — VANCOMYCIN HCL IN DEXTROSE 1-5 GM/200ML-% IV SOLN
1000.0000 mg | Freq: Two times a day (BID) | INTRAVENOUS | Status: DC
  Administered 2017-03-15 – 2017-03-17 (×4): 1000 mg via INTRAVENOUS
  Filled 2017-03-15 (×3): qty 200

## 2017-03-15 MED ORDER — ONDANSETRON HCL 4 MG PO TABS: 4.0000 mg | ORAL_TABLET | Freq: Four times a day (QID) | ORAL | Status: DC | PRN

## 2017-03-15 MED ORDER — NADOLOL 40 MG PO TABS
40.0000 mg | ORAL_TABLET | Freq: Every day | ORAL | Status: DC
  Administered 2017-03-16 – 2017-03-17 (×2): 40 mg via ORAL
  Filled 2017-03-15 (×2): qty 1

## 2017-03-15 MED ORDER — IOPAMIDOL (ISOVUE-300) INJECTION 61%
50.0000 mL | Freq: Once | INTRAVENOUS | Status: AC | PRN
  Administered 2017-03-15: 20 mL

## 2017-03-15 MED ORDER — VANCOMYCIN HCL IN DEXTROSE 1-5 GM/200ML-% IV SOLN
1000.0000 mg | Freq: Once | INTRAVENOUS | Status: AC
  Administered 2017-03-15: 1000 mg via INTRAVENOUS
  Filled 2017-03-15: qty 200

## 2017-03-15 MED ORDER — PIPERACILLIN-TAZOBACTAM 3.375 G IVPB 30 MIN
3.3750 g | Freq: Once | INTRAVENOUS | Status: AC
  Administered 2017-03-15: 3.375 g via INTRAVENOUS
  Filled 2017-03-15: qty 50

## 2017-03-15 MED ORDER — ENOXAPARIN SODIUM 40 MG/0.4ML ~~LOC~~ SOLN
40.0000 mg | SUBCUTANEOUS | Status: DC
  Administered 2017-03-15 – 2017-03-16 (×2): 40 mg via SUBCUTANEOUS
  Filled 2017-03-15 (×2): qty 0.4

## 2017-03-15 MED ORDER — PIPERACILLIN-TAZOBACTAM 3.375 G IVPB
3.3750 g | Freq: Three times a day (TID) | INTRAVENOUS | Status: DC
  Administered 2017-03-15 – 2017-03-17 (×5): 3.375 g via INTRAVENOUS
  Filled 2017-03-15 (×6): qty 50

## 2017-03-15 MED ORDER — DIAZEPAM 5 MG PO TABS: 2.5000 mg | ORAL_TABLET | Freq: Three times a day (TID) | ORAL | Status: DC | PRN

## 2017-03-15 MED ORDER — ACETAMINOPHEN 325 MG PO TABS: 650.0000 mg | ORAL_TABLET | Freq: Four times a day (QID) | ORAL | Status: DC | PRN

## 2017-03-15 MED ORDER — OXYCODONE HCL 5 MG PO TABS
5.0000 mg | ORAL_TABLET | ORAL | Status: DC | PRN
  Administered 2017-03-15 – 2017-03-17 (×2): 5 mg via ORAL
  Filled 2017-03-15 (×2): qty 1

## 2017-03-15 MED ORDER — ACETAMINOPHEN 650 MG RE SUPP: 650.0000 mg | Freq: Four times a day (QID) | RECTAL | Status: DC | PRN

## 2017-03-15 MED ORDER — SODIUM CHLORIDE 0.9 % IV SOLN
INTRAVENOUS | Status: DC
  Administered 2017-03-15: 21:00:00 via INTRAVENOUS

## 2017-03-15 MED ORDER — HYDROMORPHONE HCL-NACL 0.5-0.9 MG/ML-% IV SOSY
1.0000 mg | PREFILLED_SYRINGE | INTRAVENOUS | Status: DC | PRN
  Administered 2017-03-15 – 2017-03-17 (×7): 1 mg via INTRAVENOUS
  Filled 2017-03-15 (×7): qty 2

## 2017-03-15 NOTE — ED Triage Notes (Signed)
Patient woke up with fever and took Tylenol at 2am. Patient also not having any drainage out of bag from liver, so when called doctor office was told to come to ED.

## 2017-03-15 NOTE — Progress Notes (Signed)
Patient's wife called as the patient's new exchanged PTC drain is not draining.  It stopped yesterday.  He is having pain with flushing which is new as well.  He woke up this morning with a fever of 101.  He took '1000mg'$  of tylenol which has reduced his fever.  I suspect he will need his drain evaluated, but given his new onset pain and fevers, he will need a medical evaluation as well to make sure he doesn't require admission etc.  He will likely require labs, which we are unable to do just bringing him in for a drain evaluation, and at this time, he needs more than just his drain evaluated.  We will await an order to be placed for drain evaluation once he arrives in the ED.  The wife is agreeable with this plan.  Airabella Barley E 8:16 AM 03/15/2017

## 2017-03-15 NOTE — Progress Notes (Addendum)
Pharmacy Antibiotic Note  Bryan Fields is a 56 y.o. male admitted on 03/15/2017 with cholantitis vs intra-abd infection, has chronic biliary drain, needs exchange.  Pharmacy has been consulted for Zosyn, Vancomycin dosing.   Plan: Vancomycin 1gm in ED, followed by 1gm q12, more recent Vanc dose 1gm q12 with trough of 16 Zosyn 3.375gm q8hr, 4 hr infusion   Height: 5\' 11"  (180.3 cm) Weight: 208 lb (94.3 kg) IBW/kg (Calculated) : 75.3  Temp (24hrs), Avg:98.1 F (36.7 C), Min:97.7 F (36.5 C), Max:98.3 F (36.8 C)   Recent Labs Lab 03/15/17 0924 03/15/17 0937  WBC 5.2  --   CREATININE 1.34*  --   LATICACIDVEN  --  1.54    Estimated Creatinine Clearance: 70.5 mL/min (A) (by C-G formula based on SCr of 1.34 mg/dL (H)).    Allergies  Allergen Reactions  . Other     Chemo therapy drug: Oxali Caused itching and redness.    Antimicrobials this admission: 10/8 Vancomycin  >>  10/8  Zosyn >>   Dose adjustments this admission:  Microbiology results: 10/8 BCx: sent  Thank you for allowing pharmacy to be a part of this patient's care.  Minda Ditto PharmD Pager [phone] 03/15/2017, 12:24 PM

## 2017-03-15 NOTE — ED Notes (Addendum)
ED TO INPATIENT HANDOFF REPORT  Name/Age/Gender Bryan Fields 56 y.o. male  Code Status Code Status History    Date Active Date Inactive Code Status Order ID Comments User Context   03/05/2017  4:49 PM 03/08/2017  6:46 PM DNR [id number]  Lavina Hamman, MD Inpatient   01/04/2017  4:41 PM 01/12/2017  4:44 PM Full Code [id number]  Vianne Bulls, MD Inpatient   12/08/2016  8:19 PM 12/18/2016  8:47 PM Full Code [id number]  Vianne Bulls, MD ED   10/21/2016  2:55 PM 10/26/2016  4:22 PM Full Code [id number]  Owens Shark, NP Inpatient   07/23/2016  5:03 PM 07/26/2016  4:42 PM Full Code [id number]  Stark Klein, MD Inpatient   08/06/2014 11:09 AM 08/07/2014  3:44 AM Full Code [id number]  Markus Daft, MD HOV   01/15/2014  4:41 PM 01/22/2014  5:40 PM Full Code [id number]  Stark Klein, MD Inpatient   11/07/2013 10:19 AM 11/08/2013  3:34 AM Full Code [id number]  Sandi Mariscal, MD HOV    Questions for Most Recent Historical Code Status (Order [id number])    Question Answer Comment   In the event of cardiac or respiratory ARREST Do not call a "code blue"    In the event of cardiac or respiratory ARREST Do not perform Intubation, CPR, defibrillation or ACLS    In the event of cardiac or respiratory ARREST Use medication by any route, position, wound care, and other measures to relive pain and suffering. May use oxygen, suction and manual treatment of airway obstruction as needed for comfort.         Advance Directive Documentation     Most Recent Value  Type of Advance Directive  Healthcare Power of Attorney, Living will  Pre-existing out of facility DNR order (yellow form or pink MOST form)  -  "MOST" Form in Place?  -      Home/SNF/Other Home  Chief Complaint blue chemo card with fever  Level of Care/Admitting Diagnosis ED Disposition    ED Disposition Condition [place]: [place] [[id number]]  Level of Care: Med-Surg [16]  Diagnosis: Cholangitis  [576.1.ICD-9-CM]  Admitting Physician: Merton Border [[id number]]  Attending Physician: Laren Everts, Laura  Estimated length of stay: past midnight tomorrow  Certification:: I certify this patient will need inpatient services for at least 2 midnights  PT Class (Do Not Modify): Inpatient [101]  PT Acc Code (Do Not Modify): Private [1]       Medical History Past Medical History:  Diagnosis Date  . 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 BPH (benign prostatic hypertrophy)    mild, 35g prostate per urology  . 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 Colon polyp   . GERD (gastroesophageal reflux disease)   . Hematuria 2010   s/p normal w/u by urology  . Hypercholesterolemia   . Hypertension   . Impotence, organic    viagra per urology  . Pneumonia ~ 2016    Allergies Allergies  Allergen Reactions  . Other     Chemo therapy drug: Oxali Caused itching and redness.     IV Location/Drains/Wounds Patient Lines/Drains/Airways Status   Active Line/Drains/Airways    Name:   Placement date:   Placement time:   Site:   Days:   Implanted Port 01/09/16 Right Chest  01/09/16  9233    Chest    431   Closed System Drain Superior Abdomen Bulb (JP) 19 Fr.  07/23/16    1451    Abdomen    235   Biliary Tube Cook slip-coat 12 Fr. RUQ  03/06/17    1627    RUQ    9   GI Stent 10 Fr.  10/15/16    1214        151   GI Stent 10 Fr.  12/16/16    1440        89   Incision (Closed) 07/23/16 Abdomen  07/23/16    1508      235   Incision (Closed) 01/04/17 Abdomen Right  01/04/17    1614      70   Wound / Incision (Open or Dehisced) 10/21/16  10/21/16    1647        145   Wound / Incision (Open or Dehisced) 12/08/16 Incision - Open Sternum Posterior 2X2 cm healing from tumor removal surgery in Feb 2018  12/08/16    2100    Sternum    97          Labs/Imaging Results for orders placed or  performed during the hospital encounter of 03/15/17 (from the past 48 hour(s))  CBC with Differential/Platelet     Status: Abnormal   Collection Time: 03/15/17  9:24 AM  Result Value Ref Range   WBC 5.2 4.0 - 10.5 K/uL   RBC 3.44 (L) 4.22 - 5.81 MIL/uL   Hemoglobin 11.5 (L) 13.0 - 17.0 g/dL   HCT 34.3 (L) 39.0 - 52.0 %   MCV 99.7 78.0 - 100.0 fL   MCH 33.4 26.0 - 34.0 pg   MCHC 33.5 30.0 - 36.0 g/dL   RDW 15.6 (H) 11.5 - 15.5 %   Platelets 134 (L) 150 - 400 K/uL   Neutrophils Relative % 64 %   Neutro Abs 3.3 1.7 - 7.7 K/uL   Lymphocytes Relative 16 %   Lymphs Abs 0.8 0.7 - 4.0 K/uL   Monocytes Relative 10 %   Monocytes Absolute 0.5 0.1 - 1.0 K/uL   Eosinophils Relative 10 %   Eosinophils Absolute 0.5 0.0 - 0.7 K/uL   Basophils Relative 0 %   Basophils Absolute 0.0 0.0 - 0.1 K/uL  Comprehensive metabolic panel     Status: Abnormal   Collection Time: 03/15/17  9:24 AM  Result Value Ref Range   Sodium 138 135 - 145 mmol/L   Potassium 3.9 3.5 - 5.1 mmol/L   Chloride 104 101 - 111 mmol/L   CO2 26 22 - 32 mmol/L   Glucose, Bld 108 (H) 65 - 99 mg/dL   BUN 15 6 - 20 mg/dL   Creatinine, Ser 1.34 (H) 0.61 - 1.24 mg/dL   Calcium 9.4 8.9 - 10.3 mg/dL   Total Protein 8.2 (H) 6.5 - 8.1 g/dL   Albumin 3.4 (L) 3.5 - 5.0 g/dL   AST 72 (H) 15 - 41 U/L   ALT 54 17 - 63 U/L   Alkaline Phosphatase 126 38 - 126 U/L   Total Bilirubin 5.8 (H) 0.3 - 1.2 mg/dL   GFR calc non Af Amer 57 (L) >60 mL/min   GFR calc Af Amer >60 >60 mL/min    Comment: (NOTE) The eGFR has been calculated using the CKD EPI equation. This calculation has not been validated in all clinical situations. eGFR's persistently <60 mL/min signify possible Chronic Kidney Disease.  Anion gap 8 5 - 15  Protime-INR     Status: Abnormal   Collection Time: 03/15/17  9:24 AM  Result Value Ref Range   Prothrombin Time 16.1 (H) 11.4 - 15.2 seconds   INR 1.31   I-Stat CG4 Lactic Acid, ED     Status: None   Collection Time:  03/15/17  9:37 AM  Result Value Ref Range   Lactic Acid, Venous 1.54 0.5 - 1.9 mmol/L   No results found.  Pending Labs [facility]     Ordered   03/15/17 0908  Culture, blood (Routine X 2) w Reflex to ID Panel  BLOOD CULTURE X 2,   STAT     03/15/17 0908   Signed and Held  CBC  (enoxaparin (LOVENOX)    CrCl >/= 30 ml/min)  Once,   R    Comments:  Baseline for enoxaparin therapy IF NOT ALREADY DRAWN.  Notify MD if PLT < 100 K.    Signed and Held   Signed and Held  Creatinine, serum  (enoxaparin (LOVENOX)    CrCl >/= 30 ml/min)  Once,   R    Comments:  Baseline for enoxaparin therapy IF NOT ALREADY DRAWN.    Signed and Held   Signed and Held  Creatinine, serum  (enoxaparin (LOVENOX)    CrCl >/= 30 ml/min)  Weekly,   R    Comments:  while on enoxaparin therapy    Signed and Held   Signed and Held  Comprehensive metabolic panel  Tomorrow morning,   R     Signed and Held   Signed and Held  CBC  Tomorrow morning,   R     Signed and Held      Vitals/Pain Today's Vitals   03/15/17 0852 03/15/17 0907 03/15/17 1000 03/15/17 1030  BP: 121/78 116/78 105/71 114/86  Pulse: (!) 51 60 (!) 57 63  Resp: '16 12 10 12  '$ Temp: 98.2 F (36.8 C) 98.3 F (36.8 C)    TempSrc: Oral Oral    SpO2: 99% 99% 97% 98%  Weight: 208 lb (94.3 kg) 208 lb (94.3 kg)    Height: '5\' 11"'$  (1.803 m) '5\' 11"'$  (1.803 m)      Isolation Precautions No active isolations  Medications Medications  piperacillin-tazobactam (ZOSYN) IVPB 3.375 g (0 g Intravenous Stopped 03/15/17 1030)  vancomycin (VANCOCIN) IVPB 1000 mg/200 mL premix (1,000 mg Intravenous New Bag/Given 03/15/17 1014)    Mobility

## 2017-03-15 NOTE — ED Notes (Signed)
BLADDER SCAN RESULTS 27mL'S
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
## 2017-03-15 NOTE — ED Notes (Signed)
2ND BLOOD CULTURE 5ML/EACH OBTAIN PORT

## 2017-03-15 NOTE — Progress Notes (Signed)
A consult was received from an ED physician for vanc per pharmacy dosing.  The patient's profile has been reviewed for ht/wt/allergies/indication/available labs.   A one time order has been placed for 1g.  Further antibiotics/pharmacy consults should be ordered by admitting physician if indicated.                       Thank you,  Kara Mead 03/15/2017  9:10 AM

## 2017-03-15 NOTE — Progress Notes (Signed)
Patient ID: Bryan Fields, male   DOB: 1960-11-11, 56 y.o.   MRN: [id number]    Referring Physician(s): Dr. Tanna Furry  Supervising Physician: Arne Cleveland  Patient Status: [facility] - In-pt  Chief Complaint: Colon cancer with liver mets  Subjective: Patient is known to IR service as he had a PTC drain placed on 01-05-17 secondary to biliary obstruction secondary to liver lesions from metastatic colon cancer.  He has required 3 drain exchanges since placement as well as an upsize.  His last exchange was just done on 03-06-17 secondary to cholangitis.  Since then his drain has been to a gravity bag.  About 3 days ago he began having some dark urine as well as some vague abdominal pain in the upper abdomen.  He then started having pain with flushing at home.  Yesterday, his drain stopped draining altogether.  He ran a fever of 101 overnight.  He came to the ED this morning and his TB is 5.8.  He has been admitted for recurrent cholangitis.  We have been asked to see him for evaluation of his drain.  Allergies: Other  Medications: Prior to Admission medications   Medication Sig Start Date End Date Taking? Authorizing Provider  acetaminophen (TYLENOL) 325 MG tablet Take 650 mg by mouth every 6 (six) hours as needed for moderate pain or fever.   Yes [provider]  Cyanocobalamin (VITAMIN B-12) 2500 MCG SUBL Place 1 tablet under the tongue daily.   Yes [provider]  diazepam (VALIUM) 5 MG tablet Take 1 tablet (5 mg total) by mouth 3 (three) times daily as needed for muscle spasms. Patient taking differently: Take 2.5 mg by mouth 3 (three) times daily as needed for muscle spasms.  02/01/17  Yes Ladell Pier, MD  HYDROmorphone (DILAUDID) 4 MG tablet Take 1-2 tablets (4-8 mg total) by mouth every 4 (four) hours as needed for severe pain. 03/08/17  Yes Ladell Pier, MD  ibuprofen (ADVIL,MOTRIN) 200 MG tablet Take 600 mg by mouth every 6 (six) hours as needed for mild pain.     Yes [provider]  lidocaine-prilocaine (EMLA) cream Apply 1 application topically as needed (apply to port).   Yes [provider]  metoCLOPramide (REGLAN) 10 MG tablet Take 1 tablet (10 mg total) by mouth 3 (three) times daily before meals. Patient taking differently: Take 10 mg by mouth every 8 (eight) hours as needed for nausea or vomiting.  02/04/17 03/15/17 Yes Ladell Pier, MD  morphine (MS CONTIN) 30 MG 12 hr tablet Take 1 tablet (30 mg total) by mouth every 12 (twelve) hours. Patient taking differently: Take 30 mg by mouth 2 (two) times daily as needed. Pain 02/01/17  Yes Ladell Pier, MD  nadolol (CORGARD) 40 MG tablet Take 1 tablet (40 mg total) by mouth daily. 03/08/17  Yes Lavina Hamman, MD  ondansetron (ZOFRAN) 8 MG tablet Take 1 tablet (8 mg total) by mouth every 8 (eight) hours as needed for nausea or vomiting. 11/27/16  Yes Ladell Pier, MD  pantoprazole (PROTONIX) 40 MG tablet Take 1 tablet (40 mg total) by mouth every 12 (twelve) hours. 02/01/17  Yes Ladell Pier, MD  polyethylene glycol (MIRALAX / GLYCOLAX) packet Take 17 g by mouth daily.   Yes [provider]  sertraline (ZOLOFT) 25 MG tablet Take 1 tablet (25 mg total) by mouth daily. 03/08/17  Yes Lavina Hamman, MD  sildenafil (VIAGRA) 100 MG tablet Take 100 mg  by mouth daily as needed for erectile dysfunction. Reported on 06/17/2015   Yes [provider]    Vital Signs: BP 120/76 (BP Location: Left Arm)   Pulse 60   Temp 98.2 F (36.8 C) (Oral)   Resp 16   Ht '5\' 11"'$  (1.803 m)   Wt 208 lb (94.3 kg)   SpO2 100%   BMI 29.01 kg/m   Physical Exam: Gen: NAD Heart: regular rate and rhythm Lungs: CTAB, PAC accessed in right upper chest Abd: soft, mildly tender, drain in place and drain site with no erythema however, there is some drainage surrounding his drain, purulent vs fibrinous exudate.  Drain with bile-tinged flush present.  Only about 30cc is  present.  Imaging: No results found.  Labs:  CBC:  Recent Labs  03/06/17 0500 03/07/17 0515 03/08/17 0500 03/15/17 0924  WBC 3.8* 2.7* 1.9* 5.2  HGB 9.8* 9.4* 9.1* 11.5*  HCT 28.7* 27.7* 26.4* 34.3*  PLT 110* 111* 111* 134*    COAGS:  Recent Labs  02/09/17 1007 03/05/17 0401 03/06/17 0500 03/15/17 0924  INR 1.22 1.18 1.21 1.31    BMP:  Recent Labs  03/06/17 0500 03/07/17 0515 03/08/17 0500 03/15/17 0924  NA 133* 135 137 138  K 3.4* 3.0* 3.1* 3.9  CL 104 106 109 104  CO2 23 21* 22 26  GLUCOSE 90 94 95 108*  BUN '14 11 8 15  '$ CALCIUM 8.2* 8.2* 8.2* 9.4  CREATININE 1.07 1.06 0.98 1.34*  GFRNONAA >60 >60 >60 57*  GFRAA >60 >60 >60 >60    LIVER FUNCTION TESTS:  Recent Labs  03/06/17 0500 03/07/17 0515 03/08/17 0500 03/15/17 0924  BILITOT 1.9* 1.3* 0.9 5.8*  AST '22 18 17 '$ 72*  ALT 18 17 13* 54  ALKPHOS 112 85 77 126  PROT 7.1 6.8 6.5 8.2*  ALBUMIN 2.8* 2.6* 2.5* 3.4*    Assessment and Plan: 1. Colon cancer with liver mets 2. Biliary obstruction, s/p PTC drain placement and subsequent exchanges 3. Cholangitis  We will plan to proceed with a biliary drain injection and possible drain exchange if warranted.  After discussion with Dr. Vernard Gambles, a recommendation of CT scan should be considered to evaluate for further disease progression.  The patient requested I contact Dr. Benay Spice to let him know he has been admitted.  I have done so.  He will stop by either tonight or tomorrow.  The procedure has been discussed with the patient.  He understands and is agreeable to proceed.  Consent is signed and in the chart.  Electronically Signed: Henreitta Cea 03/15/2017, 3:28 PM   I spent a total of 25 Minutes at the the patient's bedside AND on the patient's hospital floor or unit, greater than 50% of which was counseling/coordinating care for biliary obstruction secondary to liver mets
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
## 2017-03-16 ENCOUNTER — Inpatient Hospital Stay (HOSPITAL_COMMUNITY): Payer: Medicare Other

## 2017-03-16 ENCOUNTER — Ambulatory Visit: Payer: 59 | Admitting: Oncology

## 2017-03-16 ENCOUNTER — Other Ambulatory Visit: Payer: 59

## 2017-03-16 DIAGNOSIS — C189 Malignant neoplasm of colon, unspecified: Secondary | ICD-10-CM

## 2017-03-16 DIAGNOSIS — C182 Malignant neoplasm of ascending colon: Secondary | ICD-10-CM

## 2017-03-16 DIAGNOSIS — I10 Essential (primary) hypertension: Secondary | ICD-10-CM

## 2017-03-16 DIAGNOSIS — K219 Gastro-esophageal reflux disease without esophagitis: Secondary | ICD-10-CM

## 2017-03-16 DIAGNOSIS — C787 Secondary malignant neoplasm of liver and intrahepatic bile duct: Secondary | ICD-10-CM

## 2017-03-16 LAB — COMPREHENSIVE METABOLIC PANEL
ALBUMIN: 2.7 g/dL — AB (ref 3.5–5.0)
ALK PHOS: 118 U/L (ref 38–126)
ALT: 38 U/L (ref 17–63)
ANION GAP: 9 (ref 5–15)
AST: 47 U/L — AB (ref 15–41)
BILIRUBIN TOTAL: 5 mg/dL — AB (ref 0.3–1.2)
BUN: 13 mg/dL (ref 6–20)
CALCIUM: 8.4 mg/dL — AB (ref 8.9–10.3)
CO2: 25 mmol/L (ref 22–32)
CREATININE: 1.08 mg/dL (ref 0.61–1.24)
Chloride: 102 mmol/L (ref 101–111)
GFR calc Af Amer: >60 mL/min (ref >60)
GFR calc non Af Amer: >60 mL/min (ref >60)
GLUCOSE: 86 mg/dL (ref 65–99)
Potassium: 3.2 mmol/L — ABNORMAL LOW (ref 3.5–5.1)
SODIUM: 136 mmol/L (ref 135–145)
TOTAL PROTEIN: 6.8 g/dL (ref 6.5–8.1)

## 2017-03-16 LAB — CBC
HCT: 27.5 % — ABNORMAL LOW (ref 39.0–52.0)
Hemoglobin: 9.3 g/dL — ABNORMAL LOW (ref 13.0–17.0)
MCH: 33.3 pg (ref 26.0–34.0)
MCHC: 33.8 g/dL (ref 30.0–36.0)
MCV: 98.6 fL (ref 78.0–100.0)
PLATELETS: 115 10*3/uL — AB (ref 150–400)
RBC: 2.79 MIL/uL — ABNORMAL LOW (ref 4.22–5.81)
RDW: 15.3 % (ref 11.5–15.5)
WBC: 2.6 10*3/uL — ABNORMAL LOW (ref 4.0–10.5)

## 2017-03-16 MED ORDER — METOCLOPRAMIDE HCL 10 MG PO TABS: 10.0000 mg | ORAL_TABLET | Freq: Three times a day (TID) | ORAL | Status: DC | PRN

## 2017-03-16 MED ORDER — IOPAMIDOL (ISOVUE-300) INJECTION 61%
30.0000 mL | Freq: Once | INTRAVENOUS | Status: DC | PRN
  Administered 2017-03-16: 30 mL via ORAL
  Filled 2017-03-16: qty 30

## 2017-03-16 MED ORDER — IOPAMIDOL (ISOVUE-300) INJECTION 61%
INTRAVENOUS | Status: AC
  Filled 2017-03-16: qty 100

## 2017-03-16 MED ORDER — HYDROMORPHONE HCL 4 MG PO TABS
4.0000 mg | ORAL_TABLET | ORAL | Status: DC | PRN
  Administered 2017-03-17: 4 mg via ORAL
  Filled 2017-03-16: qty 1

## 2017-03-16 MED ORDER — POLYETHYLENE GLYCOL 3350 17 G PO PACK
17.0000 g | PACK | Freq: Every day | ORAL | Status: DC
  Filled 2017-03-16 (×2): qty 1

## 2017-03-16 MED ORDER — IOPAMIDOL (ISOVUE-300) INJECTION 61%
INTRAVENOUS | Status: AC
  Administered 2017-03-16: 100 mL via INTRAVENOUS
  Filled 2017-03-16: qty 30

## 2017-03-16 MED ORDER — VITAMIN B-12 1000 MCG PO TABS
2500.0000 ug | ORAL_TABLET | Freq: Every day | ORAL | Status: DC
  Administered 2017-03-16 – 2017-03-17 (×2): 2500 ug via ORAL
  Filled 2017-03-16 (×2): qty 3

## 2017-03-16 MED ORDER — SERTRALINE HCL 25 MG PO TABS
25.0000 mg | ORAL_TABLET | Freq: Every day | ORAL | Status: DC
  Administered 2017-03-16 – 2017-03-17 (×2): 25 mg via ORAL
  Filled 2017-03-16 (×2): qty 1

## 2017-03-16 MED ORDER — PANTOPRAZOLE SODIUM 40 MG PO TBEC
40.0000 mg | DELAYED_RELEASE_TABLET | Freq: Two times a day (BID) | ORAL | Status: DC
  Administered 2017-03-16 – 2017-03-17 (×3): 40 mg via ORAL
  Filled 2017-03-16 (×3): qty 1

## 2017-03-16 MED ORDER — POTASSIUM CHLORIDE CRYS ER 20 MEQ PO TBCR
40.0000 meq | EXTENDED_RELEASE_TABLET | Freq: Once | ORAL | Status: AC
  Administered 2017-03-16: 40 meq via ORAL
  Filled 2017-03-16: qty 2

## 2017-03-16 MED ORDER — VITAMIN B-12 2500 MCG SL SUBL: 1.0000 | SUBLINGUAL_TABLET | Freq: Every day | SUBLINGUAL | Status: DC

## 2017-03-16 NOTE — Progress Notes (Signed)
PROGRESS NOTE  Bryan Fields  MLY:[id number] DOB: 10/08/1960 DOA: 03/15/2017 PCP: Ria Bush, MD  Outpatient Specialists: Oncology, Benay Spice Brief Narrative: Bryan Fields is a 56 y.o. 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 He has required 3 drain exchanges and an upsize, last 9/29, and struggled with recurrent cholangitis. He presented to the ED 10/8 for lack of output, fever, and abdominal pain, admitted for presumed cholangitis. TBili up to 5.8, febrile. Tube study has been normal. CT abd/pelvis ordered to evaluate metastatic disease, for concern that progression has caused intrahepatic obstruction. Cultures were drawn peripherally and from port, vancomycin and zosyn were started.   Assessment & Plan: Active Problems:   Cholangitis  Metastatic colon adenocarcinoma: s/p right colectomy.  - Check CT abd/pelvis - Appreciate Dr. Gearldine Shown involvement.  - Will ask for palliative assistance if scan shows progression.   Cholangitis, biliary obstruction with worsening hyperbilirubinemia: Biliary drain functioning properly based on study by IR 10/8. ?intrahepatic due to tumor progression.  - Continue zosyn, and vanc given h/o S. hemolyticus bacteremia, pending cultures. - Monitor blood cultures for at least 48 hours. - Continue dilaudid prn pain.   Chemotherapy-induced pancytopenia: No current indications for transfusions. - Follow up with oncology  Essential HTN:  - Continue nadolol. At inpatient goal.   GERD:  - Continue PPI  DVT prophylaxis: Lovenox Code Status: Full code per H&P Family Communication: Wife at bedside Disposition Plan: Admission for at least 48 hours for IV antibiotics, await cultures and further work up.   Consultants:   Oncology, [place]  Interventional radiology  Procedures:   IR Cholangiogram 03/15/2017: IMPRESSION: 1. Internal/external biliary drain catheter  remains in good position, widely patent. No findings to explain patient's rising bilirubin. Cross-sectional imaging of the liver may be useful for further evaluation.  Antimicrobials:  Vancomycin, zosyn 10/8 >>   Subjective: Abdominal pain controlled with dilaudid. Ambulating, eating ok. No fevers today.   Objective: Vitals:   03/15/17 1215 03/15/17 2045 03/16/17 0510 03/16/17 1254  BP: 120/76 115/71 116/71 130/73  Pulse: 60 (!) 55 (!) 56 65  Resp: '16 16 16 16  '$ Temp: 98.2 F (36.8 C) 99 F (37.2 C) 98.6 F (37 C) 98.3 F (36.8 C)  TempSrc: Oral Oral Oral Oral  SpO2: 100% 98% 98% 100%  Weight:      Height:        Intake/Output Summary (Last 24 hours) at 03/16/17 1527 Last data filed at 03/16/17 1001  Gross per 24 hour  Intake              170 ml  Output               30 ml  Net              140 ml   Filed Weights   03/15/17 0852 03/15/17 0907  Weight: 94.3 kg (208 lb) 94.3 kg (208 lb)    Gen: Frail, pleasant 56 y.o. male in no distress Pulm: Non-labored breathing room air. Clear to auscultation bilaterally.  CV: Regular rate and rhythm. No murmur, rub, or gallop. No JVD, no pedal edema. GI: Abdomen soft, diffusely tender to palpation without rebound, non-distended, with normoactive bowel sounds. No organomegaly or masses felt. Ext: Warm, no deformities Skin: Biliary drain site without erythema or discharge. Port in right upper chest c/d/i without erythema or discharge. Diffuse mild jaundice.  Neuro: Alert and oriented. No  focal neurological deficits. Psych: Judgement and insight appear normal. Mood & affect appropriate.   Data Reviewed: I have personally reviewed following labs and imaging studies  CBC:  Recent Labs Lab 03/15/17 0924 03/16/17 0500  WBC 5.2 2.6*  NEUTROABS 3.3  --   HGB 11.5* 9.3*  HCT 34.3* 27.5*  MCV 99.7 98.6  PLT 134* 297*   Basic Metabolic Panel:  Recent Labs Lab 03/15/17 0924 03/16/17 0500  NA 138 136  K 3.9 3.2*  CL 104  102  CO2 26 25  GLUCOSE 108* 86  BUN 15 13  CREATININE 1.34* 1.08  CALCIUM 9.4 8.4*   GFR: Estimated Creatinine Clearance: 87.4 mL/min (by C-G formula based on SCr of 1.08 mg/dL). Liver Function Tests:  Recent Labs Lab 03/15/17 0924 03/16/17 0500  AST 72* 47*  ALT 54 38  ALKPHOS 126 118  BILITOT 5.8* 5.0*  PROT 8.2* 6.8  ALBUMIN 3.4* 2.7*   No results for input(s): LIPASE, AMYLASE in the last 168 hours. No results for input(s): AMMONIA in the last 168 hours. Coagulation Profile:  Recent Labs Lab 03/15/17 0924  INR 1.31   Cardiac Enzymes: No results for input(s): CKTOTAL, CKMB, CKMBINDEX, TROPONINI in the last 168 hours. BNP (last 3 results) No results for input(s): PROBNP in the last 8760 hours. HbA1C: No results for input(s): HGBA1C in the last 72 hours. CBG: No results for input(s): GLUCAP in the last 168 hours. Lipid Profile: No results for input(s): CHOL, HDL, LDLCALC, TRIG, CHOLHDL, LDLDIRECT in the last 72 hours. Thyroid Function Tests: No results for input(s): TSH, T4TOTAL, FREET4, T3FREE, THYROIDAB in the last 72 hours. Anemia Panel: No results for input(s): VITAMINB12, FOLATE, FERRITIN, TIBC, IRON, RETICCTPCT in the last 72 hours. Urine analysis:    Component Value Date/Time   COLORURINE AMBER (A) 03/05/2017 0713   APPEARANCEUR CLEAR 03/05/2017 0713   LABSPEC 1.016 03/05/2017 0713   LABSPEC 1.020 03/18/2015 0847   PHURINE 6.0 03/05/2017 0713   GLUCOSEU NEGATIVE 03/05/2017 0713   GLUCOSEU Negative 03/18/2015 0847   HGBUR NEGATIVE 03/05/2017 0713   HGBUR small 04/01/2010 0848   BILIRUBINUR NEGATIVE 03/05/2017 0713   BILIRUBINUR Negative 03/18/2015 0847   KETONESUR NEGATIVE 03/05/2017 0713   PROTEINUR NEGATIVE 03/05/2017 0713   UROBILINOGEN 0.2 03/18/2015 0847   NITRITE NEGATIVE 03/05/2017 0713   LEUKOCYTESUR NEGATIVE 03/05/2017 0713   LEUKOCYTESUR Negative 03/18/2015 0847   No results found for this or any previous visit (from the past 240  hour(s)).    Radiology Studies: Ir Cholangiogram Existing Tube  Result Date: 03/15/2017 INDICATION: History of metastatic colon cancer, post left hepatectomy with recurrent disease at the level of the biliary hilum. Patient has previously undergone endoscopic placement of an un-covered metal biliary stent on 10/15/2016 and subsequent placement of an additional internal plastic biliary stent on 12/16/2016. Unfortunately, the patient experienced persistent hyperbilirubinemia and as such, underwent percutaneous transhepatic biliary drainage catheter placement by Dr. Earleen Newport on 01/04/2017. Patient recently underwent fluoroscopic guided percutaneous cholangiogram and biliary drainage catheter up sizing on 02/09/2017. He tolerated trial of capping. He has more recently experienced some upper abdominal pain. The bag was placed to external drainage and there is minimal output. Elevated bilirubin, greater than 5. EXAM: CHOLANGIOGRAM THROUGH EXISTING CATHETER MEDICATIONS: none indicated ANESTHESIA/SEDATION: None required FLUOROSCOPY TIME:  18 seconds, 4 mGy COMPLICATIONS: None immediate. PROCEDURE: Informed written consent was obtained from the patient after a thorough discussion of the procedural risks, benefits and alternatives. All questions were addressed. Maximal Sterile Barrier Technique  was utilized including caps, mask, sterile gowns, sterile gloves, sterile drape, hand hygiene and skin antiseptic. A timeout was performed prior to the initiation of the procedure. Contrast was injected through the previously placed internal/ external biliary drain catheter. Catheter was then returned to external drainage. FINDINGS: The catheters stable in position. The catheter is widely patent. Contrast flows easily through the catheter into the decompressed duodenum. The central biliary tree is decompressed. With further injection, there is further opacification of right biliary tree which appears nondilated. There is partial  filling of the cystic duct. IMPRESSION: 1. Internal/external biliary drain catheter remains in good position, widely patent. No findings to explain patient's rising bilirubin. Cross-sectional imaging of the liver may be useful for further evaluation. Electronically Signed   By: Lucrezia Europe M.D.   On: 03/15/2017 17:03    Scheduled Meds: . enoxaparin (LOVENOX) injection  40 mg Subcutaneous Q24H  . iopamidol      . nadolol  40 mg Oral Daily  . pantoprazole  40 mg Oral Q12H  . polyethylene glycol  17 g Oral Daily  . potassium chloride  40 mEq Oral Once  . sertraline  25 mg Oral Daily  . vitamin B-12  2,500 mcg Oral Daily   Continuous Infusions: . sodium chloride 75 mL/hr at 03/15/17 2128  . piperacillin-tazobactam (ZOSYN)  IV 3.375 g (03/16/17 0943)  . vancomycin Stopped (03/16/17 0236)     LOS: 1 day   Time spent: 25 minutes.  Vance Gather, MD [facility] Pager [phone]  If 7PM-7AM, please contact night-coverage [email or web address] Password [email or web address] 03/16/2017, 3:27 PM

## 2017-03-16 NOTE — Progress Notes (Signed)
IP PROGRESS NOTE  Subjective:   Mr. Goldring developed a high fever yesterday morning and was admitted for presumed cholangitis. The fever has resolved. He continues to have pain in the abdomen, relieved with hydromorphone.  Objective: Vital signs in last 24 hours: Blood pressure 130/73, pulse 65, temperature 98.3 F (36.8 C), temperature source Oral, resp. rate 16, height '5\' 11"'$  (1.803 m), weight 208 lb (94.3 kg), SpO2 100 %.  Intake/Output from previous day: 10/08 0701 - 10/09 0700 In: 550 [I.V.:250; IV Piggyback:300] Out: 30 [Drains:30]  Physical Exam:  HEENT: Scleral icterus Lungs: Clear bilaterally Cardiac: Regular rate and rhythm Abdomen: Tender in the right upper abdomen, masslike fullness surrounding the right upper quadrant scar, biliary drain site with a gauze dressing Extremities: No leg edema   Portacath/PICC-without erythema  Lab Results:  Recent Labs  03/15/17 0924 03/16/17 0500  WBC 5.2 2.6*  HGB 11.5* 9.3*  HCT 34.3* 27.5*  PLT 134* 115*    BMET  Recent Labs  03/15/17 0924 03/16/17 0500  NA 138 136  K 3.9 3.2*  CL 104 102  CO2 26 25  GLUCOSE 108* 86  BUN 15 13  CREATININE 1.34* 1.08  CALCIUM 9.4 8.4*    Lab Results  Component Value Date   CEA1 4.07 02/12/2017    Studies/Results: Ir Cholangiogram Existing Tube  Result Date: 03/15/2017 INDICATION: History of metastatic colon cancer, post left hepatectomy with recurrent disease at the level of the biliary hilum. Patient has previously undergone endoscopic placement of an un-covered metal biliary stent on 10/15/2016 and subsequent placement of an additional internal plastic biliary stent on 12/16/2016. Unfortunately, the patient experienced persistent hyperbilirubinemia and as such, underwent percutaneous transhepatic biliary drainage catheter placement by Dr. Earleen Newport on 01/04/2017. Patient recently underwent fluoroscopic guided percutaneous cholangiogram and biliary drainage catheter up  sizing on 02/09/2017. He tolerated trial of capping. He has more recently experienced some upper abdominal pain. The bag was placed to external drainage and there is minimal output. Elevated bilirubin, greater than 5. EXAM: CHOLANGIOGRAM THROUGH EXISTING CATHETER MEDICATIONS: none indicated ANESTHESIA/SEDATION: None required FLUOROSCOPY TIME:  18 seconds, 4 mGy COMPLICATIONS: None immediate. PROCEDURE: Informed written consent was obtained from the patient after a thorough discussion of the procedural risks, benefits and alternatives. All questions were addressed. Maximal Sterile Barrier Technique was utilized including caps, mask, sterile gowns, sterile gloves, sterile drape, hand hygiene and skin antiseptic. A timeout was performed prior to the initiation of the procedure. Contrast was injected through the previously placed internal/ external biliary drain catheter. Catheter was then returned to external drainage. FINDINGS: The catheters stable in position. The catheter is widely patent. Contrast flows easily through the catheter into the decompressed duodenum. The central biliary tree is decompressed. With further injection, there is further opacification of right biliary tree which appears nondilated. There is partial filling of the cystic duct. IMPRESSION: 1. Internal/external biliary drain catheter remains in good position, widely patent. No findings to explain patient's rising bilirubin. Cross-sectional imaging of the liver may be useful for further evaluation. Electronically Signed   By: Lucrezia Europe M.D.   On: 03/15/2017 17:03    Medications: I have reviewed the patient's current medications.  Assessment/Plan: 1. 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 Microsatellite stable; preserved expression of major and minor MMR proteins. He began treatment with FOLFOX in December 2011. He completed  cycle #12 on 10/27/2010. Oxaliplatin  was held with cycles number 7 through 10 and resumed with cycle #11. 1. History of neutropenia secondary to chemotherapy. 2. History of thrombocytopenia secondary chemotherapy. 3. He did not undergo a preoperative colonoscopy. He underwent a colonoscopy on 12/19/2010 with findings of a 1 cm polyp at 90 cm from the anal verge, minimal polyp at 30 cm from the anal verge and minimal diverticulosis. Colonoscopy 03/18/2012-negative. 4. 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 He began study drug on 05/13/2010. 5. Status post Port-A-Cath placement. The Port-A-Cath has been removed. 6. 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 8. Low attenuation lesion in the caudate lobe of the liver on the CT 04/11/2012-? Cyst. MRI liver on 04/20/2012 favored the caudate lobe liver lesion to represent a cyst or complex cyst. CT abdomen/pelvis 04/17/2013 with continued enlargement of hypovascular lesion in the caudate lobe of the liver.  PET scan 10/27/2011 confirmed a hypermetabolic caudate lobe liver lesion, tiny indeterminate lung nodules, and a 9 mm level II left neck node   CT biopsy of the liver lesion 11/07/2013 confirmed adenocarcinoma  Left liver and caudate resection 01/15/2014-pathology confirmed metastatic colon cancer, and negative surgical margins  Surveillance CT scans 07/24/2014 consistent with recurrent disease involving upper abdominal lymph nodes, peritoneal implants, and an anterior abdominal wall mass  Status post biopsy of the anterior abdominal wall mass 08/06/2014 with pathology showing metastatic adenocarcinoma consistent with a colon primary.  [id number] 1/28  Cycle 1 FOLFIRI/Avastin on the [facility] 1317 08/20/2014  Cycle 2 FOLFIRI/Avastin 09/03/2014  Cycle 3 held on 09/17/2014 due to  neutropenia  Cycle 3 FOLFIRI/Avastin 09/25/2014. Neulasta added beginning with cycle 3.  Cycle 4 FOLFIRI/Avastin 10/08/2014  Restaging CT scans 10/18/2014 with slight interval increase in the size of the soft tissue mass in the subcutaneous fat of the epigastric region. Underlying peritoneal implants, probable focus of residual disease along the resected margin of the liver and hepatoduodenal ligament lymphadenopathy all appeared slightly improved. No new sites of metastatic disease otherwise noted. New left lower lobe bronchopneumonia.  Cycle 5 FOLFIRI/Avastin 10/22/2014  Cycle 6 FOLFIRI/Avastin 11/06/2014  Cycle 7 FOLFIRI Avastin 11/19/2014  Cycle 8 FOLFIRI/Avastin 12/03/2014  CT 12/14/2014 with stable disease  Cycle 9 FOLFIRI/Avastin 12/17/2014  Cycle 10 FOLFIRI/Avastin 01/07/2015 (5-fluorouracil and leucovorin dose reduced)  Cycle 11 FOLFIRI/Avastin 01/21/2015  Cycle 12 FOLFIRI/Avastin 02/04/2015  CT 02/13/2015 with stable disease  Cycle 13 FOLFIRI/Avastin 02/25/2015-changed to an every three-week protocol, now off study  Avastin held with cycle 14 FOLFIRI on 03/25/2015 secondary to gingivitis  Restaging CTs 05/20/2015-slight increase in the size of the epigastric subcutaneous mass, stable peritoneal implants  FOLFIRI continued on a 3 week schedule  Avastin resumed 06/17/2015  CTs 12/23/2015-stable subxiphoid mass, stable low-attenuation mass adjacent to the left liver surgical margin, peritoneal lesions-stable  FOLFIRI continued on a 4 week schedule.  Avastin placed on hold 01/06/2016  FOLFIRI discontinued 03/02/2016 secondary to enlargement of the abdominal wall mass with new superficial fungating nodules involving the skin  Palliative radiation to the abdominal wall mass completed 03/20/2016  Cycle 1 FOLFOX12/27/2017  Cycle 2 CAPOX 06/22/2016  Clinical progression 07/07/2016 and allergic reaction to oxaliplatin 06/23/2015, CAPOX discontinued  Resection of  the main abdominal wall mass and an adjacent peritoneal mass on 07/23/2016 with the pathology confirming metastatic colon cancer  [facility] at [facility] 09/18/2016-new liver metastasis, progressive soft tissue masses at the liver resection margin, new mass adjacent  to the chest wall resection site, increase in size of peritoneal nodules  CT abdomen/pelvis 10/02/2016-interval progression of periportal adenopathy and recurrence along the hepatic resection margin. New hepatic program lesion superior right hepatic lobe. Interval increase in size of peritoneal implants left upper quadrant. Enlargement of anabdominal wall mass along the right rectus muscle.  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 This stricture was stented with a 6cm long, uncovered 10m diameter metal bile duct stent in good position.   Initiation of Lonsurf cycle 1 11/17/2016  MRI abdomen 12/24/2016-mild to moderate intrahepatic biliary ductal dilatation; stent in the common bile duct appears grossly patent; 3 intrahepatic metastases, extensive lymphadenopathy hepatoduodenal ligament and gastrohepatic ligament, multiple peritoneal implants in the upper abdomen and soft tissue metastases in the upper anterior abdominal wall musculature and overlying subcutaneous fat on the right side.  Cycle 2 Lonsurf 01/18/2017   Cycle 3 Lonsurf 02/24/2017 dose reduced 10. Iron deposition within the liver noted on MRI 04/20/2012. The ferritin level was normal on 04/17/2013. Increased iron deposition noted on the left liver resection 01/15/2014 11. Pain/bleeding at the anus reported when here 04/24/2013. Status post evaluation by Dr. NLucia Gaskinswith findings of an anal fissure. 12. Right face cystic lesion 13. Status post Port-A-Cath placement 08/14/2014 14. Delayed nausea following FOLFIRI, prophylactic Decadron added with cycle 2 with improvement. 15. Chest CT  10/18/2014 with new left lower lobe bronchopneumonia. Levaquin initiated. Resolved on CT 12/14/2014 16. Gum pain-mucositis?, Gingivitis?-Improved with discontinuation of Avastin 17. Pain/tenderness, mild erythema and full appearance over the maxillary sinuses. Maxillofacial CT 01/30/2015 with mild to moderate bilateral maxillary and sphenoid sinus inflammatory changes. Multifocal right maxillary dental periapical lucency. Status post a right upper tooth extraction with resolution of the pain. 18. Admission 10/21/2016 with nausea/vomiting-x-ray evidence for a partial small bowel obstruction, clinical improvement with bowel rest, recurrent obstructive symptoms during the week of 11/30/2016 19. Admission 12/08/2016 with a high fever-Klebsiella bacteremia confirmed, likely a biliary source  20. Mild neutropenia and anemia/thrombocytopenia secondary to chemotherapy-the platelet count and white count have recovered during this hospital admission  21. Biliary obstruction-CT 12/14/2016 revealed intrahepatic biliary dilatation despite a common bile duct stent distended gallbladder  Nuclear medicine biliary scan 12/16/2016-poor hepatic uptake with nonvisualization of the bile ducts consistent with biliary obstruction  ERCP 12/16/2016 confirmed intrahepatic biliary dilatation, status post placement of a new bile duct stent  MRI abdomen 12/24/2016-mild to moderate intrahepatic biliary ductal dilatation; stent in the common bile duct appears grossly patent; 3 intrahepatic metastases, extensive lymphadenopathy hepatoduodenal ligament and gastrohepatic ligament, multiple peritoneal implants in the upper abdomen and soft tissue metastases in the upper anterior abdominal wall musculature and overlying subcutaneous fat on the right  side.  ERCP 01/04/2017-plastic biliary stent removed, metal stent left in place, portal hypertensive changes noted  Percutaneous transhepatic cholangiogram 01/04/2017 confirmed complete occlusion of the metal stent with proximal dilation of bile ducts, status post placement of an internal/external biliary drain  02/09/2017-biliary drain exchanged and capped fora trial of internalization.  22. Fever following the 01/04/2017 ERCP procedure-biliary drainage positive for Staphylococcus hemolyticus  23. Pain secondary to metastatic colon cancer.MS Contin/Dilaudid  24.  Admission 03/05/2017 with clinical evidence of bacteremia and biliary obstruction  Status post exchange of the percutaneous biliary drain 03/06/2017  25. Admission 03/15/2018 with fever and hyperbilirubinemia  IR cholangiogram 03/15/2017-biliary drain in good position and patent  Mr. SDorranceis again admitted with fever and hyperbilirubinemia. The fever has  resolved with antibiotics. The biliary drain appears to be in good position and patent. He most likely has intrahepatic biliary obstruction related to tumor. I recommend proceeding with a restaging CT. If he is confirmed to have progressive metastatic disease in the liver I will recommend Hospice care.  Recommendations: 1. Continue antibiotics, follow-up cultures 2. CT abdomen/pelvis 3. Continue Dilaudid as needed for pain  I will follow-up on the CT result and discuss the findings with Mr. Pettingill and his wife.    LOS: 1 day   Donneta Romberg, MD   03/16/2017, 2:22 PM

## 2017-03-17 ENCOUNTER — Other Ambulatory Visit: Payer: Self-pay | Admitting: Nurse Practitioner

## 2017-03-17 ENCOUNTER — Telehealth: Payer: Self-pay | Admitting: Nurse Practitioner

## 2017-03-17 DIAGNOSIS — R109 Unspecified abdominal pain: Secondary | ICD-10-CM

## 2017-03-17 DIAGNOSIS — D61818 Other pancytopenia: Secondary | ICD-10-CM

## 2017-03-17 DIAGNOSIS — C787 Secondary malignant neoplasm of liver and intrahepatic bile duct: Principal | ICD-10-CM

## 2017-03-17 DIAGNOSIS — C189 Malignant neoplasm of colon, unspecified: Secondary | ICD-10-CM

## 2017-03-17 DIAGNOSIS — K831 Obstruction of bile duct: Secondary | ICD-10-CM

## 2017-03-17 LAB — COMPREHENSIVE METABOLIC PANEL
ALBUMIN: 2.5 g/dL — AB (ref 3.5–5.0)
ALT: 32 U/L (ref 17–63)
ANION GAP: 7 (ref 5–15)
AST: 35 U/L (ref 15–41)
Alkaline Phosphatase: 104 U/L (ref 38–126)
BUN: 10 mg/dL (ref 6–20)
CHLORIDE: 103 mmol/L (ref 101–111)
CO2: 25 mmol/L (ref 22–32)
Calcium: 8.4 mg/dL — ABNORMAL LOW (ref 8.9–10.3)
Creatinine, Ser: 1.2 mg/dL (ref 0.61–1.24)
GFR calc Af Amer: >60 mL/min (ref >60)
GFR calc non Af Amer: >60 mL/min (ref >60)
GLUCOSE: 102 mg/dL — AB (ref 65–99)
POTASSIUM: 3.3 mmol/L — AB (ref 3.5–5.1)
Sodium: 135 mmol/L (ref 135–145)
TOTAL PROTEIN: 6.7 g/dL (ref 6.5–8.1)
Total Bilirubin: 3.2 mg/dL — ABNORMAL HIGH (ref 0.3–1.2)

## 2017-03-17 LAB — CBC
HEMATOCRIT: 26.3 % — AB (ref 39.0–52.0)
Hemoglobin: 9 g/dL — ABNORMAL LOW (ref 13.0–17.0)
MCH: 33.6 pg (ref 26.0–34.0)
MCHC: 34.2 g/dL (ref 30.0–36.0)
MCV: 98.1 fL (ref 78.0–100.0)
PLATELETS: 112 10*3/uL — AB (ref 150–400)
RBC: 2.68 MIL/uL — ABNORMAL LOW (ref 4.22–5.81)
RDW: 15.4 % (ref 11.5–15.5)
WBC: 2.4 10*3/uL — AB (ref 4.0–10.5)

## 2017-03-17 MED ORDER — PANTOPRAZOLE SODIUM 40 MG PO TBEC: 40.0000 mg | DELAYED_RELEASE_TABLET | Freq: Two times a day (BID) | ORAL | 0 refills | Status: DC

## 2017-03-17 MED ORDER — POTASSIUM CHLORIDE CRYS ER 20 MEQ PO TBCR
40.0000 meq | EXTENDED_RELEASE_TABLET | Freq: Once | ORAL | Status: AC
  Administered 2017-03-17: 40 meq via ORAL
  Filled 2017-03-17: qty 2

## 2017-03-17 MED ORDER — HEPARIN SOD (PORK) LOCK FLUSH 100 UNIT/ML IV SOLN
500.0000 [IU] | Freq: Once | INTRAVENOUS | Status: AC
  Administered 2017-03-17: 500 [IU] via INTRAVENOUS
  Filled 2017-03-17: qty 5

## 2017-03-17 MED ORDER — AMOXICILLIN-POT CLAVULANATE 875-125 MG PO TABS
1.0000 | ORAL_TABLET | Freq: Two times a day (BID) | ORAL | Status: DC
  Administered 2017-03-17: 1 via ORAL
  Filled 2017-03-17: qty 1

## 2017-03-17 MED ORDER — AMOXICILLIN-POT CLAVULANATE 875-125 MG PO TABS: 1.0000 | ORAL_TABLET | Freq: Two times a day (BID) | ORAL | 0 refills | Status: DC

## 2017-03-17 NOTE — Progress Notes (Signed)
IP PROGRESS NOTE  Subjective:   No fever, no new complaint.  Objective: Vital signs in last 24 hours: Blood pressure 113/62, pulse (!) 58, temperature 98.2 F (36.8 C), temperature source Oral, resp. rate 16, height '5\' 11"'$  (1.803 m), weight 208 lb (94.3 kg), SpO2 96 %.  Intake/Output from previous day: 10/09 0701 - 10/10 0700 In: 1125 [P.O.:120; I.V.:750; IV Piggyback:250] Out: 470 [Drains:470]  Physical Exam:  HEENT: Scleral icterus  Portacath/PICC-without erythema  Lab Results:  Recent Labs  03/16/17 0500 03/17/17 0430  WBC 2.6* 2.4*  HGB 9.3* 9.0*  HCT 27.5* 26.3*  PLT 115* 112*    BMET  Recent Labs  03/16/17 0500 03/17/17 0430  NA 136 135  K 3.2* 3.3*  CL 102 103  CO2 25 25  GLUCOSE 86 102*  BUN 13 10  CREATININE 1.08 1.20  CALCIUM 8.4* 8.4*  Bilirubin 3.2, AST 35, ALT 32  Lab Results  Component Value Date   CEA1 4.07 02/12/2017    Studies/Results: Ct Abdomen Pelvis W Contrast  Result Date: 03/16/2017 CLINICAL DATA:  Colon cancer.  Jaundice. EXAM: CT ABDOMEN AND PELVIS WITH CONTRAST TECHNIQUE: Multidetector CT imaging of the abdomen and pelvis was performed using the standard protocol following bolus administration of intravenous contrast. CONTRAST:  <See Chart> ISOVUE-300 IOPAMIDOL (ISOVUE-300) INJECTION 61%, 103m ISOVUE-300 IOPAMIDOL (ISOVUE-300) INJECTION 61% COMPARISON:  12/14/2016 FINDINGS: Lower chest: No acute abnormality. Hepatobiliary: Segment 7 liver metastasis measures 2.2 cm, image 9 of series 2. Index lesion within the posterior right lobe measures 1.9 cm, image 14 of series 2. Unchanged from previous exam. Status post partial left hepatectomy. Mass along the March resection margin measures 2.9 by 3.7 cm, image 17 of series 2. Unchanged from previous exam. Gallstones are identified. There is a percutaneous biliary drain in place. Biliary stent is also identified. Mild intrahepatic biliary dilatation appears similar to previous exam. Pancreas:  Pancreas is unremarkable. Spleen: The spleen is enlarged measuring 16.6 cm in length. Adrenals/Urinary Tract: The adrenal glands are normal. Unremarkable appearance of the kidneys. The urinary bladder appears normal. Stomach/Bowel: Stomach is normal. No pathologic dilatation of the large or small bowel loops. The sigmoid colon and rectum exhibit bowel wall thickening which may reflect incomplete distention. Vascular/Lymphatic: Aortic atherosclerosis. No aneurysm. The portal vein appears chronically occluded. Nodal mass within the portal hepatis region measures 6.5 by 4.9 cm, image 23 of series 2. This is similar to the previous exam. Small retroperitoneal and mesenteric lymph nodes are again noted. No pelvic or inguinal adenopathy. Reproductive: Prostate is unremarkable. Other: Abdominal and pelvic ascites is identified. Similar to previous exam. Evidence of peritoneal metastases is again noted. Implant within the left upper quadrant measures 2.8 cm, image 28 of series 2. Previously 3.5 cm. Tumor within the right upper quadrant ventral abdominal wall measures 4.0 cm, image 23 of series 2. Previously 3.6 cm. Metastasis within the ventral abdominal wall musculature measures 2.9 cm, image 32 of series 2. Previously this measured the same. Musculoskeletal: No aggressive lytic or sclerotic bone lesions. IMPRESSION: 1. Compared with the previous exam there is been no significant interval change and tumor burden involving the abdomen. 2. Similar size of multifocal liver metastases. 3. Evidence of peritoneal carcinomatosis and right upper quadrant ventral abdominal wall metastases. Unchanged from previous exam. 4. Status post percutaneous biliary stenting. Mild intrahepatic biliary dilatation is similar to 12/14/2016. 5. Large mass within the porta hepatis region is similar in size to previous exam. 6. Mild wall thickening involving the sigmoid colon  and rectum is nonspecific in may reflect incomplete distention. 7.  Gallstones 8.  Aortic Atherosclerosis (ICD10-I70.0). 9. 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 Electronically Signed   By: Kerby Moors M.D.   On: 03/16/2017 18:58   Ir Cholangiogram Existing Tube  Result Date: 03/15/2017 INDICATION: History of metastatic colon cancer, post left hepatectomy with recurrent disease at the level of the biliary hilum. Patient has previously undergone endoscopic placement of an un-covered metal biliary stent on 10/15/2016 and subsequent placement of an additional internal plastic biliary stent on 12/16/2016. Unfortunately, the patient experienced persistent hyperbilirubinemia and as such, underwent percutaneous transhepatic biliary drainage catheter placement by Dr. Earleen Newport on 01/04/2017. Patient recently underwent fluoroscopic guided percutaneous cholangiogram and biliary drainage catheter up sizing on 02/09/2017. He tolerated trial of capping. He has more recently experienced some upper abdominal pain. The bag was placed to external drainage and there is minimal output. Elevated bilirubin, greater than 5. EXAM: CHOLANGIOGRAM THROUGH EXISTING CATHETER MEDICATIONS: none indicated ANESTHESIA/SEDATION: None required FLUOROSCOPY TIME:  18 seconds, 4 mGy COMPLICATIONS: None immediate. PROCEDURE: Informed written consent was obtained from the patient after a thorough discussion of the procedural risks, benefits and alternatives. All questions were addressed. Maximal Sterile Barrier Technique was utilized including caps, mask, sterile gowns, sterile gloves, sterile drape, hand hygiene and skin antiseptic. A timeout was performed prior to the initiation of the procedure. Contrast was injected through the previously placed internal/ external biliary drain catheter. Catheter was then returned to external drainage. FINDINGS: The catheters stable in position. The catheter is widely patent. Contrast flows easily through the catheter into the decompressed duodenum. The central  biliary tree is decompressed. With further injection, there is further opacification of right biliary tree which appears nondilated. There is partial filling of the cystic duct. IMPRESSION: 1. Internal/external biliary drain catheter remains in good position, widely patent. No findings to explain patient's rising bilirubin. Cross-sectional imaging of the liver may be useful for further evaluation. Electronically Signed   By: Lucrezia Europe M.D.   On: 03/15/2017 17:03    Medications: I have reviewed the patient's current medications.  Assessment/Plan: 1. 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 Microsatellite stable; preserved expression of major and minor MMR proteins. He began treatment with FOLFOX in December 2011. He completed cycle #12 on 10/27/2010. Oxaliplatin was held with cycles number 7 through 10 and resumed with cycle #11. 1. History of neutropenia secondary to chemotherapy. 2. History of thrombocytopenia secondary chemotherapy. 3. He did not undergo a preoperative colonoscopy. He underwent a colonoscopy on 12/19/2010 with findings of a 1 cm polyp at 90 cm from the anal verge, minimal polyp at 30 cm from the anal verge and minimal diverticulosis. Colonoscopy 03/18/2012-negative. 4. 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 He began study drug on 05/13/2010. 5. Status post Port-A-Cath placement. The Port-A-Cath has been removed. 6. 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 8. Low attenuation lesion in the caudate lobe of the liver on the CT 04/11/2012-? Cyst. MRI liver on 04/20/2012 favored the caudate lobe liver lesion to represent a cyst or complex cyst. CT abdomen/pelvis 04/17/2013 with continued enlargement of  hypovascular lesion in the caudate lobe of the liver.  PET scan 10/27/2011 confirmed a hypermetabolic caudate lobe liver lesion, tiny indeterminate lung nodules, and  a 9 mm level II left neck node   CT biopsy of the liver lesion 11/07/2013 confirmed adenocarcinoma  Left liver and caudate resection 01/15/2014-pathology confirmed metastatic colon cancer, and negative surgical margins  Surveillance CT scans 07/24/2014 consistent with recurrent disease involving upper abdominal lymph nodes, peritoneal implants, and an anterior abdominal wall mass  Status post biopsy of the anterior abdominal wall mass 08/06/2014 with pathology showing metastatic adenocarcinoma consistent with a colon primary.  [id number] 1/28  Cycle 1 FOLFIRI/Avastin on the [facility] 1317 08/20/2014  Cycle 2 FOLFIRI/Avastin 09/03/2014  Cycle 3 held on 09/17/2014 due to neutropenia  Cycle 3 FOLFIRI/Avastin 09/25/2014. Neulasta added beginning with cycle 3.  Cycle 4 FOLFIRI/Avastin 10/08/2014  Restaging CT scans 10/18/2014 with slight interval increase in the size of the soft tissue mass in the subcutaneous fat of the epigastric region. Underlying peritoneal implants, probable focus of residual disease along the resected margin of the liver and hepatoduodenal ligament lymphadenopathy all appeared slightly improved. No new sites of metastatic disease otherwise noted. New left lower lobe bronchopneumonia.  Cycle 5 FOLFIRI/Avastin 10/22/2014  Cycle 6 FOLFIRI/Avastin 11/06/2014  Cycle 7 FOLFIRI Avastin 11/19/2014  Cycle 8 FOLFIRI/Avastin 12/03/2014  CT 12/14/2014 with stable disease  Cycle 9 FOLFIRI/Avastin 12/17/2014  Cycle 10 FOLFIRI/Avastin 01/07/2015 (5-fluorouracil and leucovorin dose reduced)  Cycle 11 FOLFIRI/Avastin 01/21/2015  Cycle 12 FOLFIRI/Avastin 02/04/2015  CT 02/13/2015 with stable disease  Cycle 13 FOLFIRI/Avastin 02/25/2015-changed to an every three-week protocol, now off study  Avastin held with cycle  14 FOLFIRI on 03/25/2015 secondary to gingivitis  Restaging CTs 05/20/2015-slight increase in the size of the epigastric subcutaneous mass, stable peritoneal implants  FOLFIRI continued on a 3 week schedule  Avastin resumed 06/17/2015  CTs 12/23/2015-stable subxiphoid mass, stable low-attenuation mass adjacent to the left liver surgical margin, peritoneal lesions-stable  FOLFIRI continued on a 4 week schedule.  Avastin placed on hold 01/06/2016  FOLFIRI discontinued 03/02/2016 secondary to enlargement of the abdominal wall mass with new superficial fungating nodules involving the skin  Palliative radiation to the abdominal wall mass completed 03/20/2016  Cycle 1 FOLFOX12/27/2017  Cycle 2 CAPOX 06/22/2016  Clinical progression 07/07/2016 and allergic reaction to oxaliplatin 06/23/2015, CAPOX discontinued  Resection of the main abdominal wall mass and an adjacent peritoneal mass on 07/23/2016 with the pathology confirming metastatic colon cancer  [facility] at [facility] 09/18/2016-new liver metastasis, progressive soft tissue masses at the liver resection margin, new mass adjacent to the chest wall resection site, increase in size of peritoneal nodules  CT abdomen/pelvis 10/02/2016-interval progression of periportal adenopathy and recurrence along the hepatic resection margin. New hepatic program lesion superior right hepatic lobe. Interval increase in size of peritoneal implants left upper quadrant. Enlargement of anabdominal wall mass along the right rectus muscle.  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 This stricture was stented with a 6cm long, uncovered 71mm diameter metal bile duct stent in good position.   Initiation of Lonsurf cycle 1 11/17/2016  MRI abdomen 12/24/2016-mild to moderate intrahepatic biliary ductal dilatation; stent in the common bile duct appears grossly patent; 3  intrahepatic metastases, extensive lymphadenopathy hepatoduodenal ligament and gastrohepatic ligament, multiple peritoneal implants in the upper abdomen and soft tissue metastases in the upper anterior abdominal wall musculature and overlying subcutaneous fat on the right side.  Cycle 2 Lonsurf 01/18/2017   Cycle 3 Lonsurf 02/24/2017 dose reduced 10. CT abdomen/pelvis 03/16/2017-compared to July 2018: stable mild intrahepatic biliary dilatation, stable liver metastases,  stable peritoneal nodules  11. Iron deposition within the liver noted on MRI 04/20/2012. The ferritin level was normal on 04/17/2013. Increased iron deposition noted on the left liver resection 01/15/2014 12. Pain/bleeding at the anus reported when here 04/24/2013. Status post evaluation by Dr. [name] with findings of an anal fissure. 13. Right face cystic lesion 14. Status post Port-A-Cath placement 08/14/2014 15. Delayed nausea following FOLFIRI, prophylactic Decadron added with cycle 2 with improvement. 16. Chest CT 10/18/2014 with new left lower lobe bronchopneumonia. Levaquin initiated. Resolved on CT 12/14/2014 17. Gum pain-mucositis?, Gingivitis?-Improved with discontinuation of Avastin 18. Pain/tenderness, mild erythema and full appearance over the maxillary sinuses. Maxillofacial CT 01/30/2015 with mild to moderate bilateral maxillary and sphenoid sinus inflammatory changes. Multifocal right maxillary dental periapical lucency. Status post a right upper tooth extraction with resolution of the pain. 18. Admission 10/21/2016 with nausea/vomiting-x-ray evidence for a partial small bowel obstruction, clinical improvement with bowel rest, recurrent obstructive symptoms during the week of 11/30/2016 19. Admission 12/08/2016 with a high fever-Klebsiella  bacteremia confirmed, likely a biliary source  20. Mild neutropenia and anemia/thrombocytopenia secondary to chemotherapy-the platelet count and white count have recovered during this hospital admission  21. Biliary obstruction-CT 12/14/2016 revealed intrahepatic biliary dilatation despite a common bile duct stent distended gallbladder  Nuclear medicine biliary scan 12/16/2016-poor hepatic uptake with nonvisualization of the bile ducts consistent with biliary obstruction  ERCP 12/16/2016 confirmed intrahepatic biliary dilatation, status post placement of a new bile duct stent  MRI abdomen 12/24/2016-mild to moderate intrahepatic biliary ductal dilatation; stent in the common bile duct appears grossly patent; 3 intrahepatic metastases, extensive lymphadenopathy hepatoduodenal ligament and gastrohepatic ligament, multiple peritoneal implants in the upper abdomen and soft tissue metastases in the upper anterior abdominal wall musculature and overlying subcutaneous fat on the right side.  ERCP 01/04/2017-plastic biliary stent removed, metal stent left in place, portal hypertensive changes noted  Percutaneous transhepatic cholangiogram 01/04/2017 confirmed complete occlusion of the metal stent with proximal dilation of bile ducts, status post placement of an internal/external biliary drain  02/09/2017-biliary drain exchanged and capped fora trial of internalization.  22. Fever following the 01/04/2017 ERCP procedure-biliary drainage positive for Staphylococcus hemolyticus  23. Pain secondary to metastatic colon cancer.MS Contin/Dilaudid  24.  Admission 03/05/2017 with clinical evidence of bacteremia and biliary obstruction  Status post exchange of the percutaneous biliary drain 03/06/2017  25. Admission 03/15/2018 with fever and hyperbilirubinemia  IR cholangiogram 03/15/2017-biliary drain in good position and patent  Mr. Hermiz appears stable. The fever has  resolved. There is no clinical evidence for ongoing bacteremia. Blood cultures are negative to date. There is no explanation for the recurrent biliary sepsis based on the cholangiogram and CT during this admission. It is possible the fever is from another source, but a biliary source remains most likely.  The restaging CT reveals no significant progression of the metastatic colon cancer. We will plan to initiate another cycle of Lonsurf when the bilirubin has normalized. I discussed the CT findings with Mr. Brunkhorst and his wife.  He appears stable for discharge from a Oncology standpoint. He can complete an outpatient course of antibiotics. I will present his case at the GI tumor conference next week to discuss the recurrent biliary infections.  Recommendations: 1. Follow-up blood culture, complete outpatient course of antibiotics 2. A follow-up appointment will be scheduled at the Cancer center for 03/24/2017   LOS: 2 days   Donneta Romberg, MD   03/17/2017, 9:20 AM

## 2017-03-17 NOTE — Progress Notes (Signed)
Pt discharged to home. Discharge education completed with pt and wife. PAC flushed and deaccessed. Pt requesting protonix refill. Dr. Bonner Puna paged. No other needs.

## 2017-03-17 NOTE — Discharge Summary (Signed)
Physician Discharge Summary  Bryan Fields MGQ:[id number] DOB: [birth year] DOA: 03/15/2017  PCP: Ria Bush, MD  Admit date: 03/15/2017 Discharge date: 03/17/2017  Admitted From: Home Disposition: Home   Recommendations for Outpatient Follow-up:  1. Follow up with oncology 10/17, planning another cycle of chemotherapy. Dr. Benay Spice to present case at next week's GI conference.  2. Please obtain CBC to monitor cytopenias and CMP at follow up. TBili 5.8 > 5.0 > 3.2 at discharge. 3. Please follow up on the following pending results: Final blood culture results (no growth at 48 hrs with negative rapid panel.   Home Health: None new Equipment/Devices: None new Discharge Condition: Stable CODE STATUS: Full Diet recommendation: Regular  Brief/Interim Summary: Bryan Fields is a 56 y.o. 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 He has required 3 drain exchanges and an upsize, last 9/29, and struggled with recurrent cholangitis. He presented to the ED 10/8 for lack of output, fever, and abdominal pain, admitted for presumed cholangitis. TBili up to 5.8, febrile. Tube study has been normal. CT abd/pelvis ordered to evaluate metastatic disease, for concern that progression has caused intrahepatic obstruction, though this only showed stable disease burden. Dr. Benay Spice reviewed findings with patient and wife with plans to review the case at GI tumor board. Cultures were drawn peripherally and from port, and remained negative at 48 hours. Vancomycin and zosyn were started. He remained afebrile with improving bilirubin levels. Antibiotics were transitioned to oral augmentin and he was discharged with follow up scheduled with oncology early next week.    Discharge Diagnoses:  Principal Problem:   Cholangitis Active Problems:   Primary colon cancer with metastasis to other site [facility])   Essential hypertension    Stricture of bile duct   Metastatic colon cancer to liver ([place])   Pancytopenia ([place])  Metastatic colon adenocarcinoma: s/p right colectomy. CT abd/pelvis shows stable disease (see full review below) - Appreciate Dr. Gearldine Shown involvement: Planning additional cycle of [place] when bilirubin normalizes (decreasing since admitted with uncertain etiology).   Cholangitis, biliary obstruction with worsening hyperbilirubinemia: Biliary drain functioning properly based on study by IR 10/8 and no explanation on CT abd/pelvis. Bilirubin decreasing spontaneously and remains afebrile.  - Discussed with pharmacy, will transition to augmentin to complete empiric treatment of biliary source of infection.  - Blood cultures NGTD at 48 hours, negative rapid ID.   Chemotherapy-induced pancytopenia: No current indications for transfusions. - Follow up with oncology. Stable counts.   Essential HTN:  - Continue nadolol.    GERD:  - Continue PPI  Discharge Instructions Discharge Instructions    Discharge instructions    Complete by:  As directed    Continue antibiotics: augmentin twice daily for 7 days.  Follow up with Dr. Benay Spice next week.  Seek medical attention if drain output decreases again or if you get a fever or worsening abdominal pain.   Increase activity slowly    Complete by:  As directed      Allergies as of 03/17/2017      Reactions   Other    Chemo therapy drug: Oxali Caused itching and redness.       Medication List    TAKE these medications   acetaminophen 325 MG tablet Commonly known as:  TYLENOL Take 650 mg by mouth every 6 (six) hours as needed for moderate pain or fever.   amoxicillin-clavulanate 875-125 MG tablet Commonly known as:  AUGMENTIN Take 1 tablet by mouth every 12 (twelve) hours.   diazepam 5 MG tablet Commonly known as:  VALIUM Take 1 tablet (5 mg total) by mouth 3 (three) times daily as needed for muscle spasms. What changed:  how much to take    HYDROmorphone 4 MG tablet Commonly known as:  DILAUDID Take 1-2 tablets (4-8 mg total) by mouth every 4 (four) hours as needed for severe pain.   ibuprofen 200 MG tablet Commonly known as:  ADVIL,MOTRIN Take 600 mg by mouth every 6 (six) hours as needed for mild pain.   lidocaine-prilocaine cream Commonly known as:  EMLA Apply 1 application topically as needed (apply to port).   metoCLOPramide 10 MG tablet Commonly known as:  REGLAN Take 1 tablet (10 mg total) by mouth 3 (three) times daily before meals. What changed:  when to take this  reasons to take this   morphine 30 MG 12 hr tablet Commonly known as:  MS CONTIN Take 1 tablet (30 mg total) by mouth every 12 (twelve) hours. What changed:  when to take this  reasons to take this  additional instructions   nadolol 40 MG tablet Commonly known as:  CORGARD Take 1 tablet (40 mg total) by mouth daily.   ondansetron 8 MG tablet Commonly known as:  ZOFRAN Take 1 tablet (8 mg total) by mouth every 8 (eight) hours as needed for nausea or vomiting.   pantoprazole 40 MG tablet Commonly known as:  PROTONIX Take 1 tablet (40 mg total) by mouth every 12 (twelve) hours.   polyethylene glycol packet Commonly known as:  MIRALAX / GLYCOLAX Take 17 g by mouth daily.   sertraline 25 MG tablet Commonly known as:  ZOLOFT Take 1 tablet (25 mg total) by mouth daily.   sildenafil 100 MG tablet Commonly known as:  VIAGRA Take 100 mg by mouth daily as needed for erectile dysfunction. Reported on 06/17/2015   Vitamin B-12 2500 MCG Subl Place 1 tablet under the tongue daily.      Follow-up Information    Ladell Pier, MD Follow up on 03/24/2017.   Specialty:  Oncology Contact information: [place] [address] [phone]        Ria Bush, MD .   Specialty:  [facility] Contact information: [place] [address] [phone]          Allergies   Allergen Reactions  . Other     Chemo therapy drug: Oxali Caused itching and redness.     Consultations:  Oncology, Dr. Benay Spice  IR, Dr. Vernard Gambles  Procedures/Studies: Dg Chest 2 View  Result Date: 03/05/2017 CLINICAL DATA:  Suspected sepsis.  Fever.  Active chemotherapy. EXAM: CHEST  2 VIEW COMPARISON:  Chest radiograph 01/06/2017 FINDINGS: Tip of the right chest port in the mid SVC. Improved lung aeration from prior exam. The heart is normal in size. No consolidation, pleural fluid or pulmonary edema. No pneumothorax. Biliary stent and biliary drain in the upper abdomen partially included. IMPRESSION: No acute chest finding. Electronically Signed   By: Jeb Levering M.D.   On: 03/05/2017 04:14   Ct Abdomen Pelvis W Contrast  Result Date: 03/16/2017 CLINICAL DATA:  Colon cancer.  Jaundice. EXAM: CT ABDOMEN AND PELVIS WITH CONTRAST TECHNIQUE: Multidetector CT imaging of the abdomen and pelvis was performed using the standard protocol following bolus administration of intravenous contrast. CONTRAST:  <See Chart> ISOVUE-300 IOPAMIDOL (ISOVUE-300) INJECTION 61%, 55m ISOVUE-300 IOPAMIDOL (ISOVUE-300) INJECTION 61%  COMPARISON:  12/14/2016 FINDINGS: Lower chest: No acute abnormality. Hepatobiliary: Segment 7 liver metastasis measures 2.2 cm, image 9 of series 2. Index lesion within the posterior right lobe measures 1.9 cm, image 14 of series 2. Unchanged from previous exam. Status post partial left hepatectomy. Mass along the March resection margin measures 2.9 by 3.7 cm, image 17 of series 2. Unchanged from previous exam. Gallstones are identified. There is a percutaneous biliary drain in place. Biliary stent is also identified. Mild intrahepatic biliary dilatation appears similar to previous exam. Pancreas: Pancreas is unremarkable. Spleen: The spleen is enlarged measuring 16.6 cm in length. Adrenals/Urinary Tract: The adrenal glands are normal. Unremarkable appearance of the kidneys. The urinary  bladder appears normal. Stomach/Bowel: Stomach is normal. No pathologic dilatation of the large or small bowel loops. The sigmoid colon and rectum exhibit bowel wall thickening which may reflect incomplete distention. Vascular/Lymphatic: Aortic atherosclerosis. No aneurysm. The portal vein appears chronically occluded. Nodal mass within the portal hepatis region measures 6.5 by 4.9 cm, image 23 of series 2. This is similar to the previous exam. Small retroperitoneal and mesenteric lymph nodes are again noted. No pelvic or inguinal adenopathy. Reproductive: Prostate is unremarkable. Other: Abdominal and pelvic ascites is identified. Similar to previous exam. Evidence of peritoneal metastases is again noted. Implant within the left upper quadrant measures 2.8 cm, image 28 of series 2. Previously 3.5 cm. Tumor within the right upper quadrant ventral abdominal wall measures 4.0 cm, image 23 of series 2. Previously 3.6 cm. Metastasis within the ventral abdominal wall musculature measures 2.9 cm, image 32 of series 2. Previously this measured the same. Musculoskeletal: No aggressive lytic or sclerotic bone lesions. IMPRESSION: 1. Compared with the previous exam there is been no significant interval change and tumor burden involving the abdomen. 2. Similar size of multifocal liver metastases. 3. Evidence of peritoneal carcinomatosis and right upper quadrant ventral abdominal wall metastases. Unchanged from previous exam. 4. Status post percutaneous biliary stenting. Mild intrahepatic biliary dilatation is similar to 12/14/2016. 5. Large mass within the porta hepatis region is similar in size to previous exam. 6. Mild wall thickening involving the sigmoid colon and rectum is nonspecific in may reflect incomplete distention. 7. Gallstones 8.  Aortic Atherosclerosis (ICD10-I70.0). 9. 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 Electronically Signed   By: Kerby Moors M.D.   On: 03/16/2017 18:58   Ir  Cholangiogram Existing Tube  Result Date: 03/15/2017 INDICATION: History of metastatic colon cancer, post left hepatectomy with recurrent disease at the level of the biliary hilum. Patient has previously undergone endoscopic placement of an un-covered metal biliary stent on 10/15/2016 and subsequent placement of an additional internal plastic biliary stent on 12/16/2016. Unfortunately, the patient experienced persistent hyperbilirubinemia and as such, underwent percutaneous transhepatic biliary drainage catheter placement by Dr. Earleen Newport on 01/04/2017. Patient recently underwent fluoroscopic guided percutaneous cholangiogram and biliary drainage catheter up sizing on 02/09/2017. He tolerated trial of capping. He has more recently experienced some upper abdominal pain. The bag was placed to external drainage and there is minimal output. Elevated bilirubin, greater than 5. EXAM: CHOLANGIOGRAM THROUGH EXISTING CATHETER MEDICATIONS: none indicated ANESTHESIA/SEDATION: None required FLUOROSCOPY TIME:  18 seconds, 4 mGy COMPLICATIONS: None immediate. PROCEDURE: Informed written consent was obtained from the patient after a thorough discussion of the procedural risks, benefits and alternatives. All questions were addressed. Maximal Sterile Barrier Technique was utilized including caps, mask, sterile gowns, sterile gloves, sterile drape, hand hygiene and skin antiseptic. A timeout was performed prior  to the initiation of the procedure. Contrast was injected through the previously placed internal/ external biliary drain catheter. Catheter was then returned to external drainage. FINDINGS: The catheters stable in position. The catheter is widely patent. Contrast flows easily through the catheter into the decompressed duodenum. The central biliary tree is decompressed. With further injection, there is further opacification of right biliary tree which appears nondilated. There is partial filling of the cystic duct. IMPRESSION:  1. Internal/external biliary drain catheter remains in good position, widely patent. No findings to explain patient's rising bilirubin. Cross-sectional imaging of the liver may be useful for further evaluation. Electronically Signed   By: Lucrezia Europe M.D.   On: 03/15/2017 17:03   Ir Exchange Biliary Drain  Result Date: 03/06/2017 INDICATION: History of metastatic colon cancer, post left hepatectomy with recurrent disease at the level of the biliary hilum. Patient has previously undergone endoscopic placement of an un-covered metal biliary stent on 10/15/2016 and subsequent placement of an additional internal plastic biliary stent on 12/16/2016. Unfortunately, the patient experienced persistent hyperbilirubinemia and as such, underwent percutaneous transhepatic biliary drainage catheter placement by Dr. Earleen Newport on 01/04/2017. Patient recently underwent fluoroscopic guided percutaneous cholangiogram and biliary drainage catheter up sizing on 02/09/2017. Unfortunately, the patient return to the [facility] emergency department last evening with symptoms worrisome for cholangitis as well as leaking around the previously capped biliary drainage catheter. As such, patient presents today for fluoroscopic guided biliary drainage catheter exchange EXAM: FLUOROSCOPIC GUIDED PERCUTANEOUS BILIARY DRAINAGE CATHETER EXCHANGE COMPARISON:  COMPARISON Fluoroscopic guided biliary drainage catheter exchange - 02/09/2017; biliary drainage catheter placement - 01/04/2017; CT abdomen pelvis - 12/14/2016; MRCP -12/24/2016 CONTRAST:  20 cc Omnipaque-300 administered into the collecting system FLUOROSCOPY TIME:  1 minute (25 mGy) COMPLICATIONS: None immediate. TECHNIQUE: Informed written consent was obtained from the patient after a discussion of the risks, benefits and alternatives to treatment. Questions regarding the procedure were encouraged and answered. A timeout was performed prior to the initiation of the procedure. The external  portion of the existing right-sided percutaneous biliary drainage catheter as well as the surrounding skin were prepped and draped in the usual sterile fashion. A sterile drape was applied covering the operative field. Maximum barrier sterile technique with sterile gowns and gloves were used for the procedure. A timeout was performed prior to the initiation of the procedure. A pre procedural spot fluoroscopic image was obtained after contrast was injected via the existing biliary drainage catheter. The existing biliary drainage catheter was cut and cannulated with a stiff Glidewire wire which was coiled within the proximal small bowel. Under intermittent fluoroscopic guidance, the existing PBD was exchanged for a new 12 [place] PBD. Small amount of contrast was injected via the exchanged biliary drainage catheter and a post exchange spot fluoroscopic image was obtained. The catheter was locked and secured to the skin with a single interrupted suture. The biliary drainage catheter was connected to a gravity bag. A dressing was placed. The patient tolerated the procedure well without immediate postprocedural complication. FINDINGS: Preprocedural spot fluoroscopic image demonstrates unchanged positioning of the percutaneous drainage catheter as well as the previously placed endoscopic biliary stent. Multiple surgical clips overlie the biliary hilum compatible with provided history of prior hepatectomy. The existing percutaneous biliary catheter is appropriately positioned and functioning. After successful fluoroscopic guided exchange, the new 12 [place] percutaneous biliary catheter is appropriately positioned with end coiled and locked within the proximal duodenum. IMPRESSION: Successful fluoroscopic guided exchange of right sided 12 French percutaneous biliary drainage catheter.  Biliary drainage catheter has now been connected to the gravity bag. PLAN: Given presence of advance metastatic disease on abdominal CT and  MRCP performed in July of this year, would have a low threshold for repeat a contrast-enhanced CT of the chest, abdomen pelvis to evaluate for disease progression. Electronically Signed   By: Sandi Mariscal M.D.   On: 03/06/2017 16:58   Subjective: Abdominal pain is diffuse, at baseline, controlled. No N/V/D, eating well, ambulating to bathroom as usual. No fevers, cough, dyspnea, dysuria or wounds.   Discharge Exam: Vitals:   03/16/17 2043 03/17/17 0510  BP: 120/67 113/62  Pulse: 64 (!) 58  Resp: 16 16  Temp: 98.9 F (37.2 C) 98.2 F (36.8 C)  SpO2: 98% 96%   General: Pt is alert, awake, not in acute distress. No icterus noted.  Cardiovascular: RRR, S1/S2 +, no rubs, no gallops Respiratory: CTA bilaterally, no wheezing, no rhonchi Abdominal: Soft, diffusely tender without rebound or guarding, nondistended, bowel sounds + Extremities: No edema, no cyanosis  Labs: BNP (last 3 results) No results for input(s): BNP in the last 8760 hours. Basic Metabolic Panel:  Recent Labs Lab 03/15/17 0924 03/16/17 0500 03/17/17 0430  NA 138 136 135  K 3.9 3.2* 3.3*  CL 104 102 103  CO2 '26 25 25  '$ GLUCOSE 108* 86 102*  BUN '15 13 10  '$ CREATININE 1.34* 1.08 1.20  CALCIUM 9.4 8.4* 8.4*   Liver Function Tests:  Recent Labs Lab 03/15/17 0924 03/16/17 0500 03/17/17 0430  AST 72* 47* 35  ALT 54 38 32  ALKPHOS 126 118 104  BILITOT 5.8* 5.0* 3.2*  PROT 8.2* 6.8 6.7  ALBUMIN 3.4* 2.7* 2.5*   No results for input(s): LIPASE, AMYLASE in the last 168 hours. No results for input(s): AMMONIA in the last 168 hours. CBC:  Recent Labs Lab 03/15/17 0924 03/16/17 0500 03/17/17 0430  WBC 5.2 2.6* 2.4*  NEUTROABS 3.3  --   --   HGB 11.5* 9.3* 9.0*  HCT 34.3* 27.5* 26.3*  MCV 99.7 98.6 98.1  PLT 134* 115* 112*   Cardiac Enzymes: No results for input(s): CKTOTAL, CKMB, CKMBINDEX, TROPONINI in the last 168 hours. BNP: Invalid input(s): POCBNP CBG: No results for input(s): GLUCAP in the  last 168 hours. D-Dimer No results for input(s): DDIMER in the last 72 hours. Hgb A1c No results for input(s): HGBA1C in the last 72 hours. Lipid Profile No results for input(s): CHOL, HDL, LDLCALC, TRIG, CHOLHDL, LDLDIRECT in the last 72 hours. Thyroid function studies No results for input(s): TSH, T4TOTAL, T3FREE, THYROIDAB in the last 72 hours.  Invalid input(s): FREET3 Anemia work up No results for input(s): VITAMINB12, FOLATE, FERRITIN, TIBC, IRON, RETICCTPCT in the last 72 hours. Urinalysis    Component Value Date/Time   COLORURINE AMBER (A) 03/05/2017 0713   APPEARANCEUR CLEAR 03/05/2017 0713   LABSPEC 1.016 03/05/2017 0713   LABSPEC 1.020 03/18/2015 0847   PHURINE 6.0 03/05/2017 0713   GLUCOSEU NEGATIVE 03/05/2017 0713   GLUCOSEU Negative 03/18/2015 0847   HGBUR NEGATIVE 03/05/2017 0713   HGBUR small 04/01/2010 0848   BILIRUBINUR NEGATIVE 03/05/2017 0713   BILIRUBINUR Negative 03/18/2015 0847   KETONESUR NEGATIVE 03/05/2017 0713   PROTEINUR NEGATIVE 03/05/2017 0713   UROBILINOGEN 0.2 03/18/2015 0847   NITRITE NEGATIVE 03/05/2017 0713   LEUKOCYTESUR NEGATIVE 03/05/2017 0713   LEUKOCYTESUR Negative 03/18/2015 0847    Microbiology No results found for this or any previous visit (from the past 240 hour(s)).  Time coordinating discharge:  Approximately 40 minutes  Vance Gather, MD  [facility] 03/17/2017, 10:04 AM Pager [phone]

## 2017-03-17 NOTE — Telephone Encounter (Signed)
Spoke with patient's wife regarding appts that were scheduled per 10/10 sch msg.

## 2017-03-20 LAB — CULTURE, BLOOD (ROUTINE X 2)
CULTURE: NO GROWTH
Culture: NO GROWTH
Special Requests: ADEQUATE
Special Requests: ADEQUATE

## 2017-03-23 ENCOUNTER — Telehealth: Payer: Self-pay

## 2017-03-23 NOTE — Telephone Encounter (Signed)
Wife talked with Lennette Bihari in radiology today. They see Bryan Fields in AM 10/17 at 10 am. There is bile coming out the side where his biliary drain is. Fever is 101.4 and the pt is still on antibiotics. Lennette Bihari told wife to keep eye temp, if it spiked to go to ER. Stent may be clogged and Dr Benay Spice can get him in to IR and they can flush it tomorrow.   She is letting Dr Benay Spice and Bryan Fields to know what to expect when they come in tomorrow.

## 2017-03-24 ENCOUNTER — Ambulatory Visit: Payer: Medicare Other

## 2017-03-24 ENCOUNTER — Other Ambulatory Visit (HOSPITAL_BASED_OUTPATIENT_CLINIC_OR_DEPARTMENT_OTHER): Payer: Medicare Other

## 2017-03-24 ENCOUNTER — Ambulatory Visit (HOSPITAL_COMMUNITY)
Admission: RE | Admit: 2017-03-24 | Discharge: 2017-03-24 | Disposition: A | Discharge status: Home / Self Care | Payer: Medicare Other | Source: Ambulatory Visit | Attending: Nurse Practitioner | Admitting: Nurse Practitioner

## 2017-03-24 ENCOUNTER — Ambulatory Visit (HOSPITAL_BASED_OUTPATIENT_CLINIC_OR_DEPARTMENT_OTHER): Payer: Medicare Other

## 2017-03-24 ENCOUNTER — Ambulatory Visit (HOSPITAL_BASED_OUTPATIENT_CLINIC_OR_DEPARTMENT_OTHER): Payer: Medicare Other | Admitting: Nurse Practitioner

## 2017-03-24 ENCOUNTER — Encounter (HOSPITAL_COMMUNITY): Payer: Self-pay | Admitting: Interventional Radiology

## 2017-03-24 ENCOUNTER — Other Ambulatory Visit: Payer: Self-pay | Admitting: Nurse Practitioner

## 2017-03-24 VITALS — BP 136/76 | HR 73 | Temp 98.8°F | Resp 18 | Ht 71.0 in | Wt 193.3 lb

## 2017-03-24 DIAGNOSIS — C20 Malignant neoplasm of rectum: Secondary | ICD-10-CM | POA: Diagnosis present

## 2017-03-24 DIAGNOSIS — Z4682 Encounter for fitting and adjustment of non-vascular catheter: Secondary | ICD-10-CM | POA: Diagnosis not present

## 2017-03-24 DIAGNOSIS — Z23 Encounter for immunization: Secondary | ICD-10-CM

## 2017-03-24 DIAGNOSIS — C182 Malignant neoplasm of ascending colon: Secondary | ICD-10-CM

## 2017-03-24 DIAGNOSIS — R509 Fever, unspecified: Secondary | ICD-10-CM

## 2017-03-24 DIAGNOSIS — Z95828 Presence of other vascular implants and grafts: Secondary | ICD-10-CM

## 2017-03-24 DIAGNOSIS — C787 Secondary malignant neoplasm of liver and intrahepatic bile duct: Secondary | ICD-10-CM | POA: Diagnosis not present

## 2017-03-24 DIAGNOSIS — G893 Neoplasm related pain (acute) (chronic): Secondary | ICD-10-CM

## 2017-03-24 DIAGNOSIS — C189 Malignant neoplasm of colon, unspecified: Secondary | ICD-10-CM

## 2017-03-24 HISTORY — PX: IR EXCHANGE BILIARY DRAIN: IMG6046

## 2017-03-24 LAB — CBC WITH DIFFERENTIAL/PLATELET
BASO%: 0.3 % (ref 0.0–2.0)
BASOS ABS: 0 10*3/uL (ref 0.0–0.1)
EOS ABS: 0.4 10*3/uL (ref 0.0–0.5)
EOS%: 8.4 % — ABNORMAL HIGH (ref 0.0–7.0)
HCT: 37.4 % — ABNORMAL LOW (ref 38.4–49.9)
HEMOGLOBIN: 12.5 g/dL — AB (ref 13.0–17.1)
LYMPH%: 15.7 % (ref 14.0–49.0)
MCH: 33.9 pg — AB (ref 27.2–33.4)
MCHC: 33.5 g/dL (ref 32.0–36.0)
MCV: 101.4 fL — AB (ref 79.3–98.0)
MONO#: 0.5 10*3/uL (ref 0.1–0.9)
MONO%: 11.4 % (ref 0.0–14.0)
NEUT#: 2.8 10*3/uL (ref 1.5–6.5)
NEUT%: 64.2 % (ref 39.0–75.0)
PLATELETS: 138 10*3/uL — AB (ref 140–400)
RBC: 3.68 10*6/uL — ABNORMAL LOW (ref 4.20–5.82)
RDW: 17.1 % — AB (ref 11.0–14.6)
WBC: 4.4 10*3/uL (ref 4.0–10.3)
lymph#: 0.7 10*3/uL — ABNORMAL LOW (ref 0.9–3.3)

## 2017-03-24 LAB — COMPREHENSIVE METABOLIC PANEL
ALBUMIN: 3.3 g/dL — AB (ref 3.5–5.0)
ALK PHOS: 141 U/L (ref 40–150)
ALT: 33 U/L (ref 0–55)
ANION GAP: 9 meq/L (ref 3–11)
AST: 44 U/L — ABNORMAL HIGH (ref 5–34)
BILIRUBIN TOTAL: 5.1 mg/dL — AB (ref 0.20–1.20)
BUN: 14.1 mg/dL (ref 7.0–26.0)
CALCIUM: 9.7 mg/dL (ref 8.4–10.4)
CO2: 27 mEq/L (ref 22–29)
Chloride: 99 mEq/L (ref 98–109)
Creatinine: 1.5 mg/dL — ABNORMAL HIGH (ref 0.7–1.3)
EGFR: 52 mL/min/{1.73_m2} — AB (ref >60)
GLUCOSE: 113 mg/dL (ref 70–140)
POTASSIUM: 4.2 meq/L (ref 3.5–5.1)
SODIUM: 135 meq/L — AB (ref 136–145)
TOTAL PROTEIN: 9 g/dL — AB (ref 6.4–8.3)

## 2017-03-24 MED ORDER — FENTANYL CITRATE (PF) 100 MCG/2ML IJ SOLN
50.0000 ug | Freq: Once | INTRAMUSCULAR | Status: AC
  Administered 2017-03-24: 50 ug via INTRAVENOUS

## 2017-03-24 MED ORDER — LIDOCAINE HCL (PF) 2 % IJ SOLN
INTRAMUSCULAR | Status: AC
  Filled 2017-03-24: qty 10

## 2017-03-24 MED ORDER — HYDROMORPHONE HCL 4 MG PO TABS: 4.0000 mg | ORAL_TABLET | ORAL | 0 refills | Status: DC | PRN

## 2017-03-24 MED ORDER — IOPAMIDOL (ISOVUE-300) INJECTION 61%
INTRAVENOUS | Status: AC
  Filled 2017-03-24: qty 50

## 2017-03-24 MED ORDER — SODIUM CHLORIDE 0.9% FLUSH
10.0000 mL | Freq: Once | INTRAVENOUS | Status: DC
  Administered 2017-03-24: 10 mL
  Filled 2017-03-24: qty 10

## 2017-03-24 MED ORDER — INFLUENZA VAC SPLIT QUAD 0.5 ML IM SUSY
0.5000 mL | PREFILLED_SYRINGE | Freq: Once | INTRAMUSCULAR | Status: AC
  Administered 2017-03-24: 0.5 mL via INTRAMUSCULAR
  Filled 2017-03-24: qty 0.5

## 2017-03-24 MED ORDER — HEPARIN SOD (PORK) LOCK FLUSH 100 UNIT/ML IV SOLN
500.0000 [IU] | Freq: Once | INTRAVENOUS | Status: DC
  Filled 2017-03-24: qty 5

## 2017-03-24 MED ORDER — SODIUM CHLORIDE 0.9 % IV SOLN
1000.0000 mL | INTRAVENOUS | Status: AC
  Administered 2017-03-24: 1000 mL via INTRAVENOUS

## 2017-03-24 MED ORDER — FENTANYL CITRATE (PF) 100 MCG/2ML IJ SOLN
INTRAMUSCULAR | Status: AC
  Filled 2017-03-24: qty 2

## 2017-03-24 MED ORDER — HEPARIN SOD (PORK) LOCK FLUSH 100 UNIT/ML IV SOLN
500.0000 [IU] | Freq: Once | INTRAVENOUS | Status: AC | PRN
  Administered 2017-03-24: 500 [IU] via INTRAVENOUS
  Filled 2017-03-24: qty 5

## 2017-03-24 MED ORDER — IOPAMIDOL (ISOVUE-300) INJECTION 61%
10.0000 mL | Freq: Once | INTRAVENOUS | Status: AC | PRN
Start: 2017-03-24 — End: 2017-03-24
  Administered 2017-03-24: 10 mL via INTRAVENOUS

## 2017-03-24 MED ORDER — SODIUM CHLORIDE 0.9 % IJ SOLN
10.0000 mL | INTRAMUSCULAR | Status: DC | PRN
  Administered 2017-03-24: 10 mL via INTRAVENOUS
  Filled 2017-03-24: qty 10

## 2017-03-24 MED ORDER — SERTRALINE HCL 25 MG PO TABS: 25.0000 mg | ORAL_TABLET | Freq: Every day | ORAL | 1 refills | Status: AC

## 2017-03-24 NOTE — Patient Instructions (Signed)
Dehydration, Adult Dehydration is a condition in which there is not enough fluid or water in the body. This happens when you lose more fluids than you take in. Important organs, such as the kidneys, brain, and heart, cannot function without a proper amount of fluids. Any loss of fluids from the body can lead to dehydration. Dehydration can range from mild to severe. This condition should be treated right away to prevent it from becoming severe. What are the causes? This condition may be caused by:  Vomiting.  Diarrhea.  Excessive sweating, such as from heat exposure or exercise.  Not drinking enough fluid, especially: ? When ill. ? While doing activity that requires a lot of energy.  Excessive urination.  Fever.  Infection.  Certain medicines, such as medicines that cause the body to lose excess fluid (diuretics).  Inability to access safe drinking water.  Reduced physical ability to get adequate water and food.  What increases the risk? This condition is more likely to develop in people:  Who have a poorly controlled long-term (chronic) illness, such as diabetes, heart disease, or kidney disease.  Who are age 65 or older.  Who are disabled.  Who live in a place with high altitude.  Who play endurance sports.  What are the signs or symptoms? Symptoms of mild dehydration may include:  Thirst.  Dry lips.  Slightly dry mouth.  Dry, warm skin.  Dizziness. Symptoms of moderate dehydration may include:  Very dry mouth.  Muscle cramps.  Dark urine. Urine may be the color of tea.  Decreased urine production.  Decreased tear production.  Heartbeat that is irregular or faster than normal (palpitations).  Headache.  Light-headedness, especially when you stand up from a sitting position.  Fainting (syncope). Symptoms of severe dehydration may include:  Changes in skin, such as: ? Cold and clammy skin. ? Blotchy (mottled) or pale skin. ? Skin that does  not quickly return to normal after being lightly pinched and released (poor skin turgor).  Changes in body fluids, such as: ? Extreme thirst. ? No tear production. ? Inability to sweat when body temperature is high, such as in hot weather. ? Very little urine production.  Changes in vital signs, such as: ? Weak pulse. ? Pulse that is more than 100 beats a minute when sitting still. ? Rapid breathing. ? Low blood pressure.  Other changes, such as: ? Sunken eyes. ? Cold hands and feet. ? Confusion. ? Lack of energy (lethargy). ? Difficulty waking up from sleep. ? Short-term weight loss. ? Unconsciousness. How is this diagnosed? This condition is diagnosed based on your symptoms and a physical exam. Blood and urine tests may be done to help confirm the diagnosis. How is this treated? Treatment for this condition depends on the severity. Mild or moderate dehydration can often be treated at home. Treatment should be started right away. Do not wait until dehydration becomes severe. Severe dehydration is an emergency and it needs to be treated in a hospital. Treatment for mild dehydration may include:  Drinking more fluids.  Replacing salts and minerals in your blood (electrolytes) that you may have lost. Treatment for moderate dehydration may include:  Drinking an oral rehydration solution (ORS). This is a drink that helps you replace fluids and electrolytes (rehydrate). It can be found at pharmacies and retail stores. Treatment for severe dehydration may include:  Receiving fluids through an IV tube.  Receiving an electrolyte solution through a feeding tube that is passed through your nose   and into your stomach (nasogastric tube, or NG tube).  Correcting any abnormalities in electrolytes.  Treating the underlying cause of dehydration. Follow these instructions at home:  If directed by your health care provider, drink an ORS: ? Make an ORS by following instructions on the  package. ? Start by drinking small amounts, about  cup (120 mL) every 5-10 minutes. ? Slowly increase how much you drink until you have taken the amount recommended by your health care provider.  Drink enough clear fluid to keep your urine clear or pale yellow. If you were told to drink an ORS, finish the ORS first, then start slowly drinking other clear fluids. Drink fluids such as: ? Water. Do not drink only water. Doing that can lead to having too little salt (sodium) in the body (hyponatremia). ? Ice chips. ? Fruit juice that you have added water to (diluted fruit juice). ? Low-calorie sports drinks.  Avoid: ? Alcohol. ? Drinks that contain a lot of sugar. These include high-calorie sports drinks, fruit juice that is not diluted, and soda. ? Caffeine. ? Foods that are greasy or contain a lot of fat or sugar.  Take over-the-counter and prescription medicines only as told by your health care provider.  Do not take sodium tablets. This can lead to having too much sodium in the body (hypernatremia).  Eat foods that contain a healthy balance of electrolytes, such as bananas, oranges, potatoes, tomatoes, and spinach.  Keep all follow-up visits as told by your health care provider. This is important. Contact a health care provider if:  You have abdominal pain that: ? Gets worse. ? Stays in one area (localizes).  You have a rash.  You have a stiff neck.  You are more irritable than usual.  You are sleepier or more difficult to wake up than usual.  You feel weak or dizzy.  You feel very thirsty.  You have urinated only a small amount of very dark urine over 6-8 hours. Get help right away if:  You have symptoms of severe dehydration.  You cannot drink fluids without vomiting.  Your symptoms get worse with treatment.  You have a fever.  You have a severe headache.  You have vomiting or diarrhea that: ? Gets worse. ? Does not go away.  You have blood or green matter  (bile) in your vomit.  You have blood in your stool. This may cause stool to look black and tarry.  You have not urinated in 6-8 hours.  You faint.  Your heart rate while sitting still is over 100 beats a minute.  You have trouble breathing. This information is not intended to replace advice given to you by your health care provider. Make sure you discuss any questions you have with your health care provider. Document Released: 05/25/2005 Document Revised: 12/20/2015 Document Reviewed: 07/19/2015 Elsevier Interactive Patient Education  2018 [employer]  

## 2017-03-24 NOTE — Progress Notes (Signed)
RN visit only for IV fluids.  Tolerated well. Felt better after fluids. VSS

## 2017-03-24 NOTE — Progress Notes (Addendum)
[facility] OFFICE PROGRESS NOTE   Diagnosis:  Colon cancer  INTERVAL HISTORY:   Mr. Budnick returns as scheduled. He was hospitalized 03/15/2017 through 10/10/2018with probable biliary sepsis. He completed the course of antibiotics earlier today. He reports low-grade fever overnight. No shaking chills. He is having pain and excessive drainage around the biliary drain. Bowels are moving. No nausea.  Objective:  Vital signs in last 24 hours:  Blood pressure 136/76, pulse 73, temperature 98.8 F (37.1 C), temperature source Oral, resp. rate 18, height _0  (1.803 m), weight 193 lb 4.8 oz (87.7 kg), SpO2 100 %.    HEENT: no thrush or ulcers. Scleral icterus. Resp: lungs clear bilaterally. Cardio: regular rate and rhythm GI: right abdomen biliary drain. Masslike fullness right abdomen. Vascular: no leg edema. Port-A-Cath without erythema.  Lab Results:  Lab Results  Component Value Date   WBC 4.4 03/24/2017   HGB 12.5 (L) 03/24/2017   HCT 37.4 (L) 03/24/2017   MCV 101.4 (H) 03/24/2017   PLT 138 (L) 03/24/2017   NEUTROABS 2.8 03/24/2017    Imaging:  No results found.  Medications: I have reviewed the patient's current medications.  Assessment/Plan: 1. 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 Microsatellite stable; preserved expression of major and minor MMR proteins. He began treatment with FOLFOX in December 2011. He completed cycle #12 on 10/27/2010. Oxaliplatin was held with cycles number 7 through 10 and resumed with cycle #11. 1. History of neutropenia secondary to chemotherapy. 2. History of thrombocytopenia secondary chemotherapy. 3. He did not undergo a preoperative colonoscopy. He underwent a colonoscopy on 12/19/2010 with findings of a 1 cm polyp at 90 cm from the anal verge, minimal polyp at 30 cm from the anal verge and minimal  diverticulosis. Colonoscopy 03/18/2012-negative. 4. 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 He began study drug on 05/13/2010. 5. Status post Port-A-Cath placement. The Port-A-Cath has been removed. 6. 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 8. Low attenuation lesion in the caudate lobe of the liver on the CT 04/11/2012-? Cyst. MRI liver on 04/20/2012 favored the caudate lobe liver lesion to represent a cyst or complex cyst. CT abdomen/pelvis 04/17/2013 with continued enlargement of hypovascular lesion in the caudate lobe of the liver.  PET scan 10/27/2011 confirmed a hypermetabolic caudate lobe liver lesion, tiny indeterminate lung nodules, and a 9 mm level II left neck node   CT biopsy of the liver lesion 11/07/2013 confirmed adenocarcinoma  Left liver and caudate resection 01/15/2014-pathology confirmed metastatic colon cancer, and negative surgical margins  Surveillance CT scans 07/24/2014 consistent with recurrent disease involving upper abdominal lymph nodes, peritoneal implants, and an anterior abdominal wall mass  Status post biopsy of the anterior abdominal wall mass 08/06/2014 with pathology showing metastatic adenocarcinoma consistent with a colon primary.  UGT1A1 1/28  Cycle 1 FOLFIRI/Avastin on the [facility] 08/20/2014  Cycle 2 FOLFIRI/Avastin 09/03/2014  Cycle 3 held on 09/17/2014 due to neutropenia  Cycle 3 FOLFIRI/Avastin 09/25/2014. Neulasta added beginning with cycle 3.  Cycle 4 FOLFIRI/Avastin 10/08/2014  Restaging CT scans 10/18/2014 with slight interval increase in the size of the soft tissue mass in the subcutaneous fat of the epigastric region. Underlying peritoneal implants, probable focus of residual disease along the resected margin of the liver and hepatoduodenal  ligament  lymphadenopathy all appeared slightly improved. No new sites of metastatic disease otherwise noted. New left lower lobe bronchopneumonia.  Cycle 5 FOLFIRI/Avastin 10/22/2014  Cycle 6 FOLFIRI/Avastin 11/06/2014  Cycle 7 FOLFIRI Avastin 11/19/2014  Cycle 8 FOLFIRI/Avastin 12/03/2014  CT 12/14/2014 with stable disease  Cycle 9 FOLFIRI/Avastin 12/17/2014  Cycle 10 FOLFIRI/Avastin 01/07/2015 (5-fluorouracil and leucovorin dose reduced)  Cycle 11 FOLFIRI/Avastin 01/21/2015  Cycle 12 FOLFIRI/Avastin 02/04/2015  CT 02/13/2015 with stable disease  Cycle 13 FOLFIRI/Avastin 02/25/2015-changed to an every three-week protocol, now off study  Avastin held with cycle 14 FOLFIRI on 03/25/2015 secondary to gingivitis  Restaging CTs 05/20/2015-slight increase in the size of the epigastric subcutaneous mass, stable peritoneal implants  FOLFIRI continued on a 3 week schedule  Avastin resumed 06/17/2015  CTs 12/23/2015-stable subxiphoid mass, stable low-attenuation mass adjacent to the left liver surgical margin, peritoneal lesions-stable  FOLFIRI continued on a 4 week schedule.  Avastin placed on hold 01/06/2016  FOLFIRI discontinued 03/02/2016 secondary to enlargement of the abdominal wall mass with new superficial fungating nodules involving the skin  Palliative radiation to the abdominal wall mass completed 03/20/2016  Cycle 1 FOLFOX12/27/2017  Cycle 2 CAPOX 06/22/2016  Clinical progression 07/07/2016 and allergic reaction to oxaliplatin 06/23/2015, CAPOX discontinued  Resection of the main abdominal wall mass and an adjacent peritoneal mass on 07/23/2016 with the pathology confirming metastatic colon cancer  [facility] at [facility] 09/18/2016-new liver metastasis, progressive soft tissue masses at the liver resection margin, new mass adjacent to the chest wall resection site, increase in size of peritoneal nodules  CT abdomen/pelvis 10/02/2016-interval progression of periportal  adenopathy and recurrence along the hepatic resection margin. New hepatic program lesion superior right hepatic lobe. Interval increase in size of peritoneal implants left upper quadrant. Enlargement of anabdominal wall mass along the right rectus muscle.  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 This stricture was stented with a 6cm long, uncovered 58m diameter metal bile duct stent in good position.   Initiation of Lonsurf cycle 1 11/17/2016  MRI abdomen 12/24/2016-mild to moderate intrahepatic biliary ductal dilatation; stent in the common bile duct appears grossly patent; 3 intrahepatic metastases, extensive lymphadenopathy hepatoduodenal ligament and gastrohepatic ligament, multiple peritoneal implants in the upper abdomen and soft tissue metastases in the upper anterior abdominal wall musculature and overlying subcutaneous fat on the right side.  Cycle 2 Lonsurf 01/18/2017   Cycle 3 Lonsurf 02/24/2017 dose reduced 10. CT abdomen/pelvis 03/16/2017-compared to July 2018: stable mild intrahepatic biliary dilatation, stable liver metastases, stable peritoneal nodules  11. Iron deposition within the liver noted on MRI 04/20/2012. The ferritin level was normal on 04/17/2013. Increased iron deposition noted on the left liver resection 01/15/2014 12. Pain/bleeding at the anus reported when here 04/24/2013. Status post evaluation by Dr. NLucia Gaskinswith findings of an anal fissure. 13. Right face cystic lesion 14. Status post Port-A-Cath placement 08/14/2014 15. Delayed nausea following FOLFIRI, prophylactic Decadron added with cycle 2 with improvement. 16. Chest CT 10/18/2014 with new left lower lobe bronchopneumonia. Levaquin initiated. Resolved on CT 12/14/2014 17. Gum pain-mucositis?, Gingivitis?-Improved with discontinuation of Avastin 18. Pain/tenderness, mild erythema and full appearance over the  maxillary sinuses. Maxillofacial CT 01/30/2015 with mild to moderate bilateral maxillary and sphenoid sinus inflammatory changes. Multifocal right maxillary dental periapical lucency. Status post a right upper tooth extraction with resolution of the pain. 18. Admission 10/21/2016 with nausea/vomiting-x-ray evidence for a partial small bowel obstruction, clinical improvement with bowel rest,  recurrent obstructive symptoms during the week of 11/30/2016 19. Admission 12/08/2016 with a high fever-Klebsiella bacteremia confirmed, likely a biliary source  20. Mild neutropenia and anemia/thrombocytopenia secondary to chemotherapy-the platelet count and white count have recovered during this hospital admission  21. Biliary obstruction-CT 12/14/2016 revealed intrahepatic biliary dilatation despite a common bile duct stent distended gallbladder  Nuclear medicine biliary scan 12/16/2016-poor hepatic uptake with nonvisualization of the bile ducts consistent with biliary obstruction  ERCP 12/16/2016 confirmed intrahepatic biliary dilatation, status post placement of a new bile duct stent  MRI abdomen 12/24/2016-mild to moderate intrahepatic biliary ductal dilatation; stent in the common bile duct appears grossly patent; 3 intrahepatic metastases, extensive lymphadenopathy hepatoduodenal ligament and gastrohepatic ligament, multiple peritoneal implants in the upper abdomen and soft tissue metastases in the upper anterior abdominal wall musculature and overlying subcutaneous fat on the right side.  ERCP 01/04/2017-plastic biliary stent removed, metal stent left in place, portal hypertensive changes noted  Percutaneous transhepatic cholangiogram 01/04/2017 confirmed complete occlusion of the metal stent with proximal dilation of  bile ducts, status post placement of an internal/external biliary drain  02/09/2017-biliary drain exchanged and capped fora trial of internalization.  22. Fever following the 01/04/2017 ERCP procedure-biliary drainage positive for Staphylococcus hemolyticus  23. Pain secondary to metastatic colon cancer.MS Contin/Dilaudid  24. Admission 03/05/2017 with clinical evidence of bacteremia and biliary obstruction  Status post exchange of the percutaneous biliary drain 03/06/2017  25. Admission 03/15/2018 with fever and hyperbilirubinemia  IR cholangiogram 03/15/2017-biliary drain in good position and patent   Disposition: Mr. Hawe appears unchanged. He is having leakage around the biliary drain and the bilirubin is further elevated. He will be seen in interventional radiology later today. He understands Lonsurf will remain on hold until the bilirubin has improved to an acceptable range to resume treatment.  He appears dehydrated. He will receive a liter of IV fluids in the office today.  He will return for labs and a follow-up visit in one week. He will contact the office in the interim with any problems.  Patient seen with Dr. Benay Spice.    Ned Card ANP/GNP-BC   03/24/2017  10:51 AM  This a shared visit with Ned Card. Mr. Schrader has hyperbilirubinemia and reports a fever last night. I suspect he has again developed biliary obstruction. His case was presented at the GI tumor conference earlier today. A drain  exchange was suggested. He will be referred to interventional radiology today.  Julieanne Manson, M.D.

## 2017-03-25 ENCOUNTER — Telehealth: Payer: Self-pay | Admitting: Nurse Practitioner

## 2017-03-25 NOTE — Telephone Encounter (Signed)
Scheduled appt per 10/17 los - patient is aware of appt date and time.

## 2017-03-30 ENCOUNTER — Other Ambulatory Visit (HOSPITAL_BASED_OUTPATIENT_CLINIC_OR_DEPARTMENT_OTHER): Payer: Medicare Other

## 2017-03-30 ENCOUNTER — Telehealth: Payer: Self-pay | Admitting: Oncology

## 2017-03-30 ENCOUNTER — Ambulatory Visit (HOSPITAL_BASED_OUTPATIENT_CLINIC_OR_DEPARTMENT_OTHER): Payer: Medicare Other | Admitting: Nurse Practitioner

## 2017-03-30 VITALS — BP 116/62 | HR 70 | Temp 99.2°F | Resp 16 | Ht 71.0 in | Wt 195.7 lb

## 2017-03-30 DIAGNOSIS — C182 Malignant neoplasm of ascending colon: Secondary | ICD-10-CM

## 2017-03-30 DIAGNOSIS — C787 Secondary malignant neoplasm of liver and intrahepatic bile duct: Secondary | ICD-10-CM | POA: Diagnosis not present

## 2017-03-30 DIAGNOSIS — C189 Malignant neoplasm of colon, unspecified: Secondary | ICD-10-CM

## 2017-03-30 DIAGNOSIS — G893 Neoplasm related pain (acute) (chronic): Secondary | ICD-10-CM | POA: Diagnosis not present

## 2017-03-30 LAB — CBC WITH DIFFERENTIAL/PLATELET
BASO%: 0.2 % (ref 0.0–2.0)
Basophils Absolute: 0 10*3/uL (ref 0.0–0.1)
EOS%: 11.3 % — ABNORMAL HIGH (ref 0.0–7.0)
Eosinophils Absolute: 0.5 10*3/uL (ref 0.0–0.5)
HCT: 34.7 % — ABNORMAL LOW (ref 38.4–49.9)
HGB: 11.2 g/dL — ABNORMAL LOW (ref 13.0–17.1)
LYMPH#: 0.8 10*3/uL — AB (ref 0.9–3.3)
LYMPH%: 16.7 % (ref 14.0–49.0)
MCH: 32.7 pg (ref 27.2–33.4)
MCHC: 32.3 g/dL (ref 32.0–36.0)
MCV: 101.5 fL — ABNORMAL HIGH (ref 79.3–98.0)
MONO#: 0.6 10*3/uL (ref 0.1–0.9)
MONO%: 11.9 % (ref 0.0–14.0)
NEUT%: 59.9 % (ref 39.0–75.0)
NEUTROS ABS: 2.8 10*3/uL (ref 1.5–6.5)
PLATELETS: 125 10*3/uL — AB (ref 140–400)
RBC: 3.42 10*6/uL — AB (ref 4.20–5.82)
RDW: 15.7 % — ABNORMAL HIGH (ref 11.0–14.6)
WBC: 4.6 10*3/uL (ref 4.0–10.3)

## 2017-03-30 LAB — COMPREHENSIVE METABOLIC PANEL
ALT: 42 U/L (ref 0–55)
ANION GAP: 9 meq/L (ref 3–11)
AST: 64 U/L — AB (ref 5–34)
Albumin: 2.9 g/dL — ABNORMAL LOW (ref 3.5–5.0)
Alkaline Phosphatase: 316 U/L — ABNORMAL HIGH (ref 40–150)
BILIRUBIN TOTAL: 2.11 mg/dL — AB (ref 0.20–1.20)
BUN: 15 mg/dL (ref 7.0–26.0)
CO2: 26 meq/L (ref 22–29)
CREATININE: 1.4 mg/dL — AB (ref 0.7–1.3)
Calcium: 9.1 mg/dL (ref 8.4–10.4)
Chloride: 102 mEq/L (ref 98–109)
EGFR: 55 mL/min/{1.73_m2} — ABNORMAL LOW (ref >60)
GLUCOSE: 111 mg/dL (ref 70–140)
Potassium: 4.4 mEq/L (ref 3.5–5.1)
Sodium: 137 mEq/L (ref 136–145)
TOTAL PROTEIN: 8.1 g/dL (ref 6.4–8.3)

## 2017-03-30 MED ORDER — TRIFLURIDINE-TIPIRACIL 20-8.19 MG PO TABS: 60.0000 mg | ORAL_TABLET | Freq: Two times a day (BID) | ORAL | 0 refills | Status: DC

## 2017-03-30 MED ORDER — NADOLOL 40 MG PO TABS: 40.0000 mg | ORAL_TABLET | Freq: Every day | ORAL | 0 refills | Status: AC

## 2017-03-30 NOTE — Telephone Encounter (Signed)
Gave avs and calendar for October and November  °

## 2017-03-30 NOTE — Progress Notes (Addendum)
[place] OFFICE PROGRESS NOTE   Diagnosis:  Colon cancer  INTERVAL HISTORY:   [name] returns as scheduled. He denies fever and chills. Appetite and energy level remain poor. Abdominal pain is controlled with Dilaudid. He has pain around the drain site.  Objective:  Vital signs in last 24 hours:  Blood pressure 116/62, pulse 70, temperature 99.2 F (37.3 C), temperature source Oral, resp. rate 16, height '5\' 11"'$  (1.803 m), weight 195 lb 11.2 oz (88.8 kg), SpO2 97 %.    HEENT: no thrush or ulcers. Resp: lungs clear bilaterally. Cardio: regular rate and rhythm. GI: right abdomen biliary drain. Masslike fullness right abdomen. Vascular: no leg edema. Neuro: alert and oriented.  Port-A-Cath without erythema.  Lab Results:  Lab Results  Component Value Date   WBC 4.6 03/30/2017   HGB 11.2 (L) 03/30/2017   HCT 34.7 (L) 03/30/2017   MCV 101.5 (H) 03/30/2017   PLT 125 (L) 03/30/2017   NEUTROABS 2.8 03/30/2017    Imaging:  No results found.  Medications: I have reviewed the patient's current medications.  Assessment/Plan: 1. 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 Microsatellite stable; preserved expression of major and minor MMR proteins. He began treatment with FOLFOX in December 2011. He completed cycle #12 on 10/27/2010. Oxaliplatin was held with cycles number 7 through 10 and resumed with cycle #11. 1. History of neutropenia secondary to chemotherapy. 2. History of thrombocytopenia secondary chemotherapy. 3. He did not undergo a preoperative colonoscopy. He underwent a colonoscopy on 12/19/2010 with findings of a 1 cm polyp at 90 cm from the anal verge, minimal polyp at 30 cm from the anal verge and minimal diverticulosis. Colonoscopy 03/18/2012-negative. 4. 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 He began study drug  on 05/13/2010. 5. Status post Port-A-Cath placement. The Port-A-Cath has been removed. 6. 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 8. Low attenuation lesion in the caudate lobe of the liver on the CT 04/11/2012-? Cyst. MRI liver on 04/20/2012 favored the caudate lobe liver lesion to represent a cyst or complex cyst. CT abdomen/pelvis 04/17/2013 with continued enlargement of hypovascular lesion in the caudate lobe of the liver.  PET scan 10/27/2011 confirmed a hypermetabolic caudate lobe liver lesion, tiny indeterminate lung nodules, and a 9 mm level II left neck node   CT biopsy of the liver lesion 11/07/2013 confirmed adenocarcinoma  Left liver and caudate resection 01/15/2014-pathology confirmed metastatic colon cancer, and negative surgical margins  Surveillance CT scans 07/24/2014 consistent with recurrent disease involving upper abdominal lymph nodes, peritoneal implants, and an anterior abdominal wall mass  Status post biopsy of the anterior abdominal wall mass 08/06/2014 with pathology showing metastatic adenocarcinoma consistent with a colon primary.  UGT1A1 1/28  Cycle 1 FOLFIRI/Avastin on the [facility] 1317 08/20/2014  Cycle 2 FOLFIRI/Avastin 09/03/2014  Cycle 3 held on 09/17/2014 due to neutropenia  Cycle 3 FOLFIRI/Avastin 09/25/2014. Neulasta added beginning with cycle 3.  Cycle 4 FOLFIRI/Avastin 10/08/2014  Restaging CT scans 10/18/2014 with slight interval increase in the size of the soft tissue mass in the subcutaneous fat of the epigastric region. Underlying peritoneal implants, probable focus of residual disease along the resected margin of the liver and hepatoduodenal ligament lymphadenopathy all appeared slightly improved. No new sites of metastatic disease otherwise noted.  New left lower lobe bronchopneumonia.  Cycle 5  FOLFIRI/Avastin 10/22/2014  Cycle 6 FOLFIRI/Avastin 11/06/2014  Cycle 7 FOLFIRI Avastin 11/19/2014  Cycle 8 FOLFIRI/Avastin 12/03/2014  CT 12/14/2014 with stable disease  Cycle 9 FOLFIRI/Avastin 12/17/2014  Cycle 10 FOLFIRI/Avastin 01/07/2015 (5-fluorouracil and leucovorin dose reduced)  Cycle 11 FOLFIRI/Avastin 01/21/2015  Cycle 12 FOLFIRI/Avastin 02/04/2015  CT 02/13/2015 with stable disease  Cycle 13 FOLFIRI/Avastin 02/25/2015-changed to an every three-week protocol, now off study  Avastin held with cycle 14 FOLFIRI on 03/25/2015 secondary to gingivitis  Restaging CTs 05/20/2015-slight increase in the size of the epigastric subcutaneous mass, stable peritoneal implants  FOLFIRI continued on a 3 week schedule  Avastin resumed 06/17/2015  CTs 12/23/2015-stable subxiphoid mass, stable low-attenuation mass adjacent to the left liver surgical margin, peritoneal lesions-stable  FOLFIRI continued on a 4 week schedule.  Avastin placed on hold 01/06/2016  FOLFIRI discontinued 03/02/2016 secondary to enlargement of the abdominal wall mass with new superficial fungating nodules involving the skin  Palliative radiation to the abdominal wall mass completed 03/20/2016  Cycle 1 FOLFOX12/27/2017  Cycle 2 CAPOX 06/22/2016  Clinical progression 07/07/2016 and allergic reaction to oxaliplatin 06/23/2015, CAPOX discontinued  Resection of the main abdominal wall mass and an adjacent peritoneal mass on 07/23/2016 with the pathology confirming metastatic colon cancer  Restaging CTs at [facility] 09/18/2016-new liver metastasis, progressive soft tissue masses at the liver resection margin, new mass adjacent to the chest wall resection site, increase in size of peritoneal nodules  CT abdomen/pelvis 10/02/2016-interval progression of periportal adenopathy and recurrence along the hepatic resection margin. New hepatic program lesion superior right hepatic lobe. Interval increase in size of  peritoneal implants left upper quadrant. Enlargement of anabdominal wall mass along the right rectus muscle.  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 This stricture was stented with a 6cm long, uncovered 55m diameter metal bile duct stent in good position.   Initiation of Lonsurf cycle 1 11/17/2016  MRI abdomen 12/24/2016-mild to moderate intrahepatic biliary ductal dilatation; stent in the common bile duct appears grossly patent; 3 intrahepatic metastases, extensive lymphadenopathy hepatoduodenal ligament and gastrohepatic ligament, multiple peritoneal implants in the upper abdomen and soft tissue metastases in the upper anterior abdominal wall musculature and overlying subcutaneous fat on the right side.  Cycle 2 Lonsurf 01/18/2017   Cycle 3 Lonsurf 02/24/2017 dose reduced 10. CT abdomen/pelvis 03/16/2017-compared to July 2018: stable mild intrahepatic biliary dilatation, stable liver metastases, stable peritoneal nodules  11. Iron deposition within the liver noted on MRI 04/20/2012. The ferritin level was normal on 04/17/2013. Increased iron deposition noted on the left liver resection 01/15/2014 12. Pain/bleeding at the anus reported when here 04/24/2013. Status post evaluation by Dr. NLucia Gaskinswith findings of an anal fissure. 13. Right face cystic lesion 14. Status post Port-A-Cath placement 08/14/2014 15. Delayed nausea following FOLFIRI, prophylactic Decadron added with cycle 2 with improvement. 16. Chest CT 10/18/2014 with new left lower lobe bronchopneumonia. Levaquin initiated. Resolved on CT 12/14/2014 17. Gum pain-mucositis?, Gingivitis?-Improved with discontinuation of Avastin 18. Pain/tenderness, mild erythema and full appearance over the maxillary sinuses. Maxillofacial CT 01/30/2015 with mild to moderate bilateral maxillary and sphenoid sinus inflammatory changes. Multifocal right  maxillary dental periapical lucency. Status post a right upper tooth extraction with resolution of the pain. 18. Admission 10/21/2016 with nausea/vomiting-x-ray evidence for a partial small bowel obstruction, clinical improvement with bowel rest, recurrent obstructive symptoms during the week of 11/30/2016 19. Admission 12/08/2016 with a high  fever-Klebsiella bacteremia confirmed, likely a biliary source  20. Mild neutropenia and anemia/thrombocytopenia secondary to chemotherapy-the platelet count and white count have recovered during this hospital admission  21. Biliary obstruction-CT 12/14/2016 revealed intrahepatic biliary dilatation despite a common bile duct stent distended gallbladder  Nuclear medicine biliary scan 12/16/2016-poor hepatic uptake with nonvisualization of the bile ducts consistent with biliary obstruction  ERCP 12/16/2016 confirmed intrahepatic biliary dilatation, status post placement of a new bile duct stent  MRI abdomen 12/24/2016-mild to moderate intrahepatic biliary ductal dilatation; stent in the common bile duct appears grossly patent; 3 intrahepatic metastases, extensive lymphadenopathy hepatoduodenal ligament and gastrohepatic ligament, multiple peritoneal implants in the upper abdomen and soft tissue metastases in the upper anterior abdominal wall musculature and overlying subcutaneous fat on the right side.  ERCP 01/04/2017-plastic biliary stent removed, metal stent left in place, portal hypertensive changes noted  Percutaneous transhepatic cholangiogram 01/04/2017 confirmed complete occlusion of the metal stent with proximal dilation of bile ducts, status post placement of an internal/external biliary drain  02/09/2017-biliary drain exchanged and capped fora trial of  internalization.  03/24/2017-biliary drain exchange  22. Fever following the 01/04/2017 ERCP procedure-biliary drainage positive for Staphylococcus hemolyticus  23. Pain secondary to metastatic colon cancer.MS Contin/Dilaudid  24. Admission 03/05/2017 with clinical evidence of bacteremia and biliary obstruction  Status post exchange of the percutaneous biliary drain 03/06/2017  25. Admission 03/15/2018 with fever and hyperbilirubinemia  IR cholangiogram 03/15/2017-biliary drain in good position and patent   Disposition: Mr. Alkire appears unchanged. He has completed 3 cycles of Lonsurf. Cycle 4 has been on hold pending improvement in the LFTs. The bilirubin is better since the recent drain exchange. He will return for a follow-up chemistry panel 04/02/2017. The plan is to proceed with the fourth cycle of Lonsurf if the bilirubin is in an acceptable range. Mr. Hankerson is in agreement with this plan. He will return for a follow-up visit 04/21/2017. He will contact the office in the interim with any problems. We specifically discussed recurrent fever.   Patient seen with Dr. Benay Spice. 25 minutes were spent face-to-face at today's visit with the majority of that time involved in counseling/coordination of care.    Ned Card ANP/GNP-BC   03/30/2017  11:22 AM  This was a shared visit with Ned Card. Mr. Starnes underwent exchange of the biliary drain last week. The bilirubin is improved today. We discussed the risk/benefit of continuing systemic therapy versus hospice care. He would like to proceed with another cycle of Lonsurf. We will follow-up on the bilirubin 04/02/2017 and begin the next cycle of Lonsurf as indicated.  Julieanne Manson, M.D.

## 2017-04-02 ENCOUNTER — Telehealth: Payer: Self-pay | Admitting: Oncology

## 2017-04-02 ENCOUNTER — Telehealth: Payer: Self-pay | Admitting: Emergency Medicine

## 2017-04-02 ENCOUNTER — Other Ambulatory Visit (HOSPITAL_BASED_OUTPATIENT_CLINIC_OR_DEPARTMENT_OTHER): Payer: Medicare Other

## 2017-04-02 ENCOUNTER — Other Ambulatory Visit: Payer: Self-pay | Admitting: Nurse Practitioner

## 2017-04-02 DIAGNOSIS — C787 Secondary malignant neoplasm of liver and intrahepatic bile duct: Secondary | ICD-10-CM | POA: Diagnosis not present

## 2017-04-02 DIAGNOSIS — C182 Malignant neoplasm of ascending colon: Secondary | ICD-10-CM

## 2017-04-02 DIAGNOSIS — C189 Malignant neoplasm of colon, unspecified: Secondary | ICD-10-CM

## 2017-04-02 LAB — COMPREHENSIVE METABOLIC PANEL
ALT: 60 U/L — AB (ref 0–55)
AST: 67 U/L — AB (ref 5–34)
Albumin: 2.8 g/dL — ABNORMAL LOW (ref 3.5–5.0)
Alkaline Phosphatase: 346 U/L — ABNORMAL HIGH (ref 40–150)
Anion Gap: 8 mEq/L (ref 3–11)
BUN: 18 mg/dL (ref 7.0–26.0)
CALCIUM: 9.2 mg/dL (ref 8.4–10.4)
CHLORIDE: 102 meq/L (ref 98–109)
CO2: 25 mEq/L (ref 22–29)
CREATININE: 1.4 mg/dL — AB (ref 0.7–1.3)
EGFR: 55 mL/min/{1.73_m2} — ABNORMAL LOW (ref >60)
GLUCOSE: 124 mg/dL (ref 70–140)
POTASSIUM: 4.7 meq/L (ref 3.5–5.1)
SODIUM: 136 meq/L (ref 136–145)
Total Bilirubin: 2.58 mg/dL — ABNORMAL HIGH (ref 0.20–1.20)
Total Protein: 8.3 g/dL (ref 6.4–8.3)

## 2017-04-02 NOTE — Telephone Encounter (Signed)
Dr.Sherrill states pt can take tylenol for low grade temp. Instructed pt to call us back if pt becomes more symptomatic. pts wife verbalized understanding.

## 2017-04-02 NOTE — Telephone Encounter (Addendum)
Called pt's wife per this note. Pt wife verbalized understanding of this note. Wife states pt has continued to have a low grade temp. Pt reports it to be 100.3 this am. Tylenol given. Dr. Benay Spice made aware. Will follow up   ----- Message from Owens Shark, NP sent at 04/02/2017  3:23 PM EDT ----- Let him know bilirubin is a little higher. Hold Lonsurf. Place biliary drain to straight drain. Repeat chemistry panel on 10/31.

## 2017-04-02 NOTE — Telephone Encounter (Signed)
Spoke with patient regarding appt added per 10/26 sch msg.

## 2017-04-06 ENCOUNTER — Other Ambulatory Visit (HOSPITAL_COMMUNITY): Payer: 59

## 2017-04-07 ENCOUNTER — Other Ambulatory Visit: Payer: Self-pay | Admitting: *Deleted

## 2017-04-07 ENCOUNTER — Other Ambulatory Visit (HOSPITAL_BASED_OUTPATIENT_CLINIC_OR_DEPARTMENT_OTHER): Payer: Medicare Other

## 2017-04-07 DIAGNOSIS — C189 Malignant neoplasm of colon, unspecified: Secondary | ICD-10-CM

## 2017-04-07 DIAGNOSIS — C182 Malignant neoplasm of ascending colon: Secondary | ICD-10-CM | POA: Diagnosis not present

## 2017-04-07 DIAGNOSIS — C787 Secondary malignant neoplasm of liver and intrahepatic bile duct: Secondary | ICD-10-CM

## 2017-04-07 LAB — COMPREHENSIVE METABOLIC PANEL
ALT: 27 U/L (ref 0–55)
AST: 30 U/L (ref 5–34)
Albumin: 3 g/dL — ABNORMAL LOW (ref 3.5–5.0)
Alkaline Phosphatase: 185 U/L — ABNORMAL HIGH (ref 40–150)
Anion Gap: 8 mEq/L (ref 3–11)
BILIRUBIN TOTAL: 1.76 mg/dL — AB (ref 0.20–1.20)
BUN: 17.3 mg/dL (ref 7.0–26.0)
CHLORIDE: 104 meq/L (ref 98–109)
CO2: 24 meq/L (ref 22–29)
Calcium: 9.2 mg/dL (ref 8.4–10.4)
Creatinine: 1.5 mg/dL — ABNORMAL HIGH (ref 0.7–1.3)
EGFR: 52 mL/min/{1.73_m2} — AB (ref >60)
Glucose: 100 mg/dl (ref 70–140)
POTASSIUM: 4.1 meq/L (ref 3.5–5.1)
Sodium: 136 mEq/L (ref 136–145)
Total Protein: 8.3 g/dL (ref 6.4–8.3)

## 2017-04-08 ENCOUNTER — Telehealth: Payer: Self-pay | Admitting: Emergency Medicine

## 2017-04-08 MED ORDER — TRIFLURIDINE-TIPIRACIL 20-8.19 MG PO TABS: 60.0000 mg | ORAL_TABLET | Freq: Two times a day (BID) | ORAL | 0 refills | Status: AC

## 2017-04-08 MED ORDER — POTASSIUM CHLORIDE CRYS ER 20 MEQ PO TBCR: 20.0000 meq | EXTENDED_RELEASE_TABLET | Freq: Every day | ORAL | 1 refills | Status: AC

## 2017-04-08 NOTE — Telephone Encounter (Addendum)
Pt verbalized understanding of this note.   ----- Message from Owens Shark, NP sent at 04/08/2017  8:59 AM EDT ----- Please let his wife know it is ok to begin Lonsurf with dose reduction.

## 2017-04-08 NOTE — Telephone Encounter (Signed)
Lonsurf script on 10/23 was inadvertently sent to pt's local pharmacy. Called in to [facility] today. Pt's wife notified. She reports they have a 1 week supply in the home and began it this morning.  She reports he has been having increased output so he resumed potassium once daily. OK to refill, per Ned Card NP. Script sent to pharmacy.

## 2017-04-09 NOTE — Telephone Encounter (Signed)
No entry 

## 2017-04-15 ENCOUNTER — Other Ambulatory Visit: Payer: Self-pay | Admitting: *Deleted

## 2017-04-15 MED ORDER — ONDANSETRON HCL 8 MG PO TABS: 8.0000 mg | ORAL_TABLET | Freq: Three times a day (TID) | ORAL | 2 refills | Status: AC | PRN

## 2017-04-19 ENCOUNTER — Other Ambulatory Visit: Payer: 59

## 2017-04-21 ENCOUNTER — Telehealth: Payer: Self-pay

## 2017-04-21 ENCOUNTER — Ambulatory Visit (HOSPITAL_BASED_OUTPATIENT_CLINIC_OR_DEPARTMENT_OTHER): Payer: Medicare Other | Admitting: Oncology

## 2017-04-21 ENCOUNTER — Ambulatory Visit: Payer: Medicare Other

## 2017-04-21 ENCOUNTER — Other Ambulatory Visit (HOSPITAL_BASED_OUTPATIENT_CLINIC_OR_DEPARTMENT_OTHER): Payer: Medicare Other

## 2017-04-21 DIAGNOSIS — R410 Disorientation, unspecified: Secondary | ICD-10-CM | POA: Diagnosis not present

## 2017-04-21 DIAGNOSIS — R630 Anorexia: Secondary | ICD-10-CM | POA: Diagnosis not present

## 2017-04-21 DIAGNOSIS — C182 Malignant neoplasm of ascending colon: Secondary | ICD-10-CM

## 2017-04-21 DIAGNOSIS — Z95828 Presence of other vascular implants and grafts: Secondary | ICD-10-CM

## 2017-04-21 DIAGNOSIS — R5381 Other malaise: Secondary | ICD-10-CM

## 2017-04-21 DIAGNOSIS — C189 Malignant neoplasm of colon, unspecified: Secondary | ICD-10-CM

## 2017-04-21 DIAGNOSIS — C787 Secondary malignant neoplasm of liver and intrahepatic bile duct: Secondary | ICD-10-CM

## 2017-04-21 DIAGNOSIS — G893 Neoplasm related pain (acute) (chronic): Secondary | ICD-10-CM

## 2017-04-21 LAB — COMPREHENSIVE METABOLIC PANEL
ALT: 96 U/L — AB (ref 0–55)
AST: 65 U/L — AB (ref 5–34)
Albumin: 3.4 g/dL — ABNORMAL LOW (ref 3.5–5.0)
Alkaline Phosphatase: 121 U/L (ref 40–150)
Anion Gap: 8 mEq/L (ref 3–11)
BUN: 23.4 mg/dL (ref 7.0–26.0)
CALCIUM: 9.8 mg/dL (ref 8.4–10.4)
CHLORIDE: 104 meq/L (ref 98–109)
CO2: 24 mEq/L (ref 22–29)
CREATININE: 1.6 mg/dL — AB (ref 0.7–1.3)
EGFR: 48 mL/min/{1.73_m2} — ABNORMAL LOW (ref >60)
Glucose: 120 mg/dl (ref 70–140)
Potassium: 4.4 mEq/L (ref 3.5–5.1)
Sodium: 137 mEq/L (ref 136–145)
Total Bilirubin: 2.25 mg/dL — ABNORMAL HIGH (ref 0.20–1.20)
Total Protein: 9 g/dL — ABNORMAL HIGH (ref 6.4–8.3)

## 2017-04-21 LAB — CBC WITH DIFFERENTIAL/PLATELET
BASO%: 0.2 % (ref 0.0–2.0)
Basophils Absolute: 0 10*3/uL (ref 0.0–0.1)
EOS ABS: 0.1 10*3/uL (ref 0.0–0.5)
EOS%: 1.9 % (ref 0.0–7.0)
HCT: 33.9 % — ABNORMAL LOW (ref 38.4–49.9)
HEMOGLOBIN: 11.4 g/dL — AB (ref 13.0–17.1)
LYMPH%: 17.1 % (ref 14.0–49.0)
MCH: 33.2 pg (ref 27.2–33.4)
MCHC: 33.6 g/dL (ref 32.0–36.0)
MCV: 98.8 fL — AB (ref 79.3–98.0)
MONO#: 0.2 10*3/uL (ref 0.1–0.9)
MONO%: 3.9 % (ref 0.0–14.0)
NEUT%: 76.9 % — ABNORMAL HIGH (ref 39.0–75.0)
NEUTROS ABS: 3.6 10*3/uL (ref 1.5–6.5)
Platelets: 109 10*3/uL — ABNORMAL LOW (ref 140–400)
RBC: 3.43 10*6/uL — AB (ref 4.20–5.82)
RDW: 14.6 % (ref 11.0–14.6)
WBC: 4.6 10*3/uL (ref 4.0–10.3)
lymph#: 0.8 10*3/uL — ABNORMAL LOW (ref 0.9–3.3)

## 2017-04-21 MED ORDER — PANTOPRAZOLE SODIUM 40 MG PO TBEC: 40.0000 mg | DELAYED_RELEASE_TABLET | Freq: Two times a day (BID) | ORAL | 0 refills | Status: DC

## 2017-04-21 MED ORDER — SODIUM CHLORIDE 0.9 % IJ SOLN
10.0000 mL | INTRAMUSCULAR | Status: DC | PRN
  Administered 2017-04-21: 10 mL via INTRAVENOUS
  Filled 2017-04-21: qty 10

## 2017-04-21 MED ORDER — HEPARIN SOD (PORK) LOCK FLUSH 100 UNIT/ML IV SOLN
500.0000 [IU] | Freq: Once | INTRAVENOUS | Status: AC | PRN
  Administered 2017-04-21: 500 [IU] via INTRAVENOUS
  Filled 2017-04-21: qty 5

## 2017-04-21 MED ORDER — HYDROMORPHONE HCL 4 MG PO TABS: 4.0000 mg | ORAL_TABLET | ORAL | 0 refills | Status: DC | PRN

## 2017-04-21 MED ORDER — MORPHINE SULFATE ER 15 MG PO TBCR: 15.0000 mg | EXTENDED_RELEASE_TABLET | Freq: Two times a day (BID) | ORAL | 0 refills | Status: DC

## 2017-04-21 NOTE — Addendum Note (Signed)
Addended by: Rosalio Macadamia C on: 04/21/2017 01:11 PM   Modules accepted: Orders

## 2017-04-21 NOTE — Telephone Encounter (Signed)
Printed avs and calender for upcoming appointment for December per 11/14 los

## 2017-04-21 NOTE — Progress Notes (Signed)
[place] OFFICE PROGRESS NOTE   Diagnosis: Colon cancer  INTERVAL HISTORY:   Bryan Fields completed another cycle of dose reduced Lonsurf getting 04/08/2017.  No diarrhea.  He continues to have malaise and anorexia. The biliary drain is functioning.  He has upper abdominal pain.  His wife has noted increased confusion when he takes the 30 mg MS Contin dose.  He takes Dilaudid every 4 hours as needed.  He is having bowel movements.  Objective:  Vital signs in last 24 hours:  Blood pressure 112/66, pulse (!) 51, temperature 98.6 F (37 C), temperature source Oral, resp. rate 17, height '5\' 11"'$  (1.803 m), weight 182 lb 6.4 oz (82.7 kg), SpO2 100 %.    HEENT: No thrush or ulcers Resp: Lungs clear bilaterally Cardio: Regular rate and rhythm GI: Masslike fullness surrounding the right upper quadrant scar with associated tenderness, no apparent ascites, right upper abdomen biliary drain site with a gauze dressing and bilious drainage Vascular: No leg edema   Portacath/PICC-without erythema  Lab Results:  Lab Results  Component Value Date   WBC 4.6 04/21/2017   HGB 11.4 (L) 04/21/2017   HCT 33.9 (L) 04/21/2017   MCV 98.8 (H) 04/21/2017   PLT 109 (L) 04/21/2017   NEUTROABS 3.6 04/21/2017    CMP     Component Value Date/Time   NA 137 04/21/2017 1058   K 4.4 04/21/2017 1058   CL 103 03/17/2017 0430   CL 103 04/11/2012 1257   CO2 24 04/21/2017 1058   GLUCOSE 120 04/21/2017 1058   GLUCOSE 89 04/11/2012 1257   BUN 23.4 04/21/2017 1058   CREATININE 1.6 (H) 04/21/2017 1058   CALCIUM 9.8 04/21/2017 1058   PROT 9.0 (H) 04/21/2017 1058   ALBUMIN 3.4 (L) 04/21/2017 1058   AST 65 (H) 04/21/2017 1058   ALT 96 (H) 04/21/2017 1058   ALKPHOS 121 04/21/2017 1058   BILITOT 2.25 (H) 04/21/2017 1058   GFRNONAA >60 03/17/2017 0430   GFRAA >60 03/17/2017 0430    Lab Results  Component Value Date   CEA1 4.07 02/12/2017     Medications: I have reviewed the  patient's current medications.  Assessment/Plan: 1. 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 Microsatellite stable; preserved expression of major and minor MMR proteins. He began treatment with FOLFOX in December 2011. He completed cycle #12 on 10/27/2010. Oxaliplatin was held with cycles number 7 through 10 and resumed with cycle #11. 1. History of neutropenia secondary to chemotherapy. 2. History of thrombocytopenia secondary chemotherapy. 3. He did not undergo a preoperative colonoscopy. He underwent a colonoscopy on 12/19/2010 with findings of a 1 cm polyp at 90 cm from the anal verge, minimal polyp at 30 cm from the anal verge and minimal diverticulosis. Colonoscopy 03/18/2012-negative. 4. 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 He began study drug on 05/13/2010. 5. Status post Port-A-Cath placement. The Port-A-Cath has been removed. 6. 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 8. Low attenuation lesion in the caudate lobe of the liver on the CT 04/11/2012-? Cyst. MRI liver on 04/20/2012 favored the caudate lobe liver lesion to represent a cyst or complex cyst. CT abdomen/pelvis 04/17/2013 with continued enlargement of hypovascular lesion in the caudate lobe of the liver.  PET scan 10/27/2011 confirmed a hypermetabolic  caudate lobe liver lesion, tiny indeterminate lung nodules, and a 9 mm level II left neck node   CT biopsy of the liver lesion 11/07/2013 confirmed adenocarcinoma  Left liver and caudate resection 01/15/2014-pathology confirmed metastatic colon cancer, and negative surgical margins  Surveillance CT scans 07/24/2014 consistent with recurrent disease involving upper abdominal lymph nodes,  peritoneal implants, and an anterior abdominal wall mass  Status post biopsy of the anterior abdominal wall mass 08/06/2014 with pathology showing metastatic adenocarcinoma consistent with a colon primary.  UGT1A1 1/28  Cycle 1 FOLFIRI/Avastin on the [facility] 1317 08/20/2014  Cycle 2 FOLFIRI/Avastin 09/03/2014  Cycle 3 held on 09/17/2014 due to neutropenia  Cycle 3 FOLFIRI/Avastin 09/25/2014. Neulasta added beginning with cycle 3.  Cycle 4 FOLFIRI/Avastin 10/08/2014  Restaging CT scans 10/18/2014 with slight interval increase in the size of the soft tissue mass in the subcutaneous fat of the epigastric region. Underlying peritoneal implants, probable focus of residual disease along the resected margin of the liver and hepatoduodenal ligament lymphadenopathy all appeared slightly improved. No new sites of metastatic disease otherwise noted. New left lower lobe bronchopneumonia.  Cycle 5 FOLFIRI/Avastin 10/22/2014  Cycle 6 FOLFIRI/Avastin 11/06/2014  Cycle 7 FOLFIRI Avastin 11/19/2014  Cycle 8 FOLFIRI/Avastin 12/03/2014  CT 12/14/2014 with stable disease  Cycle 9 FOLFIRI/Avastin 12/17/2014  Cycle 10 FOLFIRI/Avastin 01/07/2015 (5-fluorouracil and leucovorin dose reduced)  Cycle 11 FOLFIRI/Avastin 01/21/2015  Cycle 12 FOLFIRI/Avastin 02/04/2015  CT 02/13/2015 with stable disease  Cycle 13 FOLFIRI/Avastin 02/25/2015-changed to an every three-week protocol, now off study  Avastin held with cycle 14 FOLFIRI on 03/25/2015 secondary to gingivitis  Restaging CTs 05/20/2015-slight increase in the size of the epigastric subcutaneous mass, stable peritoneal implants  FOLFIRI continued on a 3 week schedule  Avastin resumed 06/17/2015  CTs 12/23/2015-stable subxiphoid mass, stable low-attenuation mass adjacent to the left liver surgical margin, peritoneal lesions-stable  FOLFIRI continued on a 4 week schedule.  Avastin placed on hold 01/06/2016  FOLFIRI discontinued 03/02/2016  secondary to enlargement of the abdominal wall mass with new superficial fungating nodules involving the skin  Palliative radiation to the abdominal wall mass completed 03/20/2016  Cycle 1 FOLFOX12/27/2017  Cycle 2 CAPOX 06/22/2016  Clinical progression 07/07/2016 and allergic reaction to oxaliplatin 06/23/2015, CAPOX discontinued  Resection of the main abdominal wall mass and an adjacent peritoneal mass on 07/23/2016 with the pathology confirming metastatic colon cancer  [facility] CT[facility] at [facility] 09/18/2016-new liver metastasis, progressive soft tissue masses at the liver resection margin, new mass adjacent to the chest wall resection site, increase in size of peritoneal nodules  CT abdomen/pelvis 10/02/2016-interval progression of periportal adenopathy and recurrence along the hepatic resection margin. New hepatic program lesion superior right hepatic lobe. Interval increase in size of peritoneal implants left upper quadrant. Enlargement of anabdominal wall mass along the right rectus muscle.  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 This stricture was stented with a 6cm long, uncovered 52m diameter metal bile duct stent in good position.   Initiation of Lonsurf cycle 1 11/17/2016  MRI abdomen 12/24/2016-mild to moderate intrahepatic biliary ductal dilatation; stent in the common bile duct appears grossly patent; 3 intrahepatic metastases, extensive lymphadenopathy hepatoduodenal ligament and gastrohepatic ligament, multiple peritoneal implants in the upper abdomen and soft tissue metastases in the upper anterior abdominal wall musculature and overlying subcutaneous fat on the right side.  Cycle 2 Lonsurf 01/18/2017   Cycle 3 Lonsurf 02/24/2017 dose reduced  Cycle 4 Lonsurf 04/08/2017 10.  CT abdomen/pelvis 03/16/2017-compared to July 2018: stable mild intrahepatic biliary dilatation, stable liver  metastases, stable peritoneal nodules  11. Iron deposition within the liver noted on MRI 04/20/2012. The ferritin level was normal on 04/17/2013. Increased iron deposition noted on the left liver resection 01/15/2014 12. Pain/bleeding at the anus reported when here 04/24/2013. Status post evaluation by Dr. [name] with findings of an anal fissure. 13. Right face cystic lesion 14. Status post Port-A-Cath placement 08/14/2014 15. Delayed nausea following FOLFIRI, prophylactic Decadron added with cycle 2 with improvement. 16. Chest CT 10/18/2014 with new left lower lobe bronchopneumonia. Levaquin initiated. Resolved on CT 12/14/2014 17. Gum pain-mucositis?, Gingivitis?-Improved with discontinuation of Avastin 18. Pain/tenderness, mild erythema and full appearance over the maxillary sinuses. Maxillofacial CT 01/30/2015 with mild to moderate bilateral maxillary and sphenoid sinus inflammatory changes. Multifocal right maxillary dental periapical lucency. Status post a right upper tooth extraction with resolution of the pain. 18. Admission 10/21/2016 with nausea/vomiting-x-ray evidence for a partial small bowel obstruction, clinical improvement with bowel rest, recurrent obstructive symptoms during the week of 11/30/2016 19. Admission 12/08/2016 with a high fever-Klebsiella bacteremia confirmed, likely a biliary source  20. Mild neutropenia and anemia/thrombocytopenia secondary to chemotherapy-the platelet count and white count have recovered during this hospital admission  21. Biliary obstruction-CT 12/14/2016 revealed intrahepatic biliary dilatation despite a common bile duct stent distended gallbladder  Nuclear medicine biliary scan 12/16/2016-poor hepatic uptake with nonvisualization of the bile ducts consistent  with biliary obstruction  ERCP 12/16/2016 confirmed intrahepatic biliary dilatation, status post placement of a new bile duct stent  MRI abdomen 12/24/2016-mild to moderate intrahepatic biliary ductal dilatation; stent in the common bile duct appears grossly patent; 3 intrahepatic metastases, extensive lymphadenopathy hepatoduodenal ligament and gastrohepatic ligament, multiple peritoneal implants in the upper abdomen and soft tissue metastases in the upper anterior abdominal wall musculature and overlying subcutaneous fat on the right side.  ERCP 01/04/2017-plastic biliary stent removed, metal stent left in place, portal hypertensive changes noted  Percutaneous transhepatic cholangiogram 01/04/2017 confirmed complete occlusion of the metal stent with proximal dilation of bile ducts, status post placement of an internal/external biliary drain  02/09/2017-biliary drain exchanged and capped fora trial of internalization.  03/24/2017-biliary drain exchange  22. Fever following the 01/04/2017 ERCP procedure-biliary drainage positive for Staphylococcus hemolyticus  23. Pain secondary to metastatic colon cancer.MS Contin/Dilaudid  24. Admission 03/05/2017 with clinical evidence of bacteremia and biliary obstruction  Status post exchange of the percutaneous biliary drain 03/06/2017  25. Admission 03/15/2018 with fever and hyperbilirubinemia  IR cholangiogram 03/15/2017-biliary drain in good position and patent   Disposition:  Bryan Fields has metastatic colon cancer.  His performance status continues to decline.  I discussed treatment options with Bryan Fields and his wife.  He will discontinue Lonsurf and enroll in Hospice care.  We made a referral to the [facility] program. I reviewed CPR and ACLS issues with Bryan Fields.  He will be placed on a no CODE BLUE status.  He will decrease the MS Contin to 15 mg twice daily.  He will return for an office visit in 3  weeks.  Betsy Coder, MD  04/21/2017  12:44 PM

## 2017-04-23 ENCOUNTER — Encounter: Payer: 59 | Admitting: Family Medicine

## 2017-04-23 ENCOUNTER — Telehealth: Payer: Self-pay | Admitting: Medical Oncology

## 2017-04-23 NOTE — Telephone Encounter (Signed)
admitted to [facility]

## 2017-04-30 ENCOUNTER — Other Ambulatory Visit: Payer: Self-pay | Admitting: Nurse Practitioner

## 2017-04-30 DIAGNOSIS — C787 Secondary malignant neoplasm of liver and intrahepatic bile duct: Principal | ICD-10-CM

## 2017-04-30 DIAGNOSIS — C189 Malignant neoplasm of colon, unspecified: Secondary | ICD-10-CM

## 2017-04-30 MED ORDER — HYDROMORPHONE HCL 4 MG PO TABS: 4.0000 mg | ORAL_TABLET | ORAL | 0 refills | Status: DC | PRN

## 2017-04-30 NOTE — Telephone Encounter (Signed)
Message from [place], [facility] RN requesting refill of Dilaudid to be faxed to pharmacy. Same done.  Refill request from [facility] for Nadolol. Per Dr. Benay Spice: DC Nadolol. Pharmacy and hospice RN notified.

## 2017-05-10 ENCOUNTER — Telehealth: Payer: Self-pay

## 2017-05-10 DIAGNOSIS — C189 Malignant neoplasm of colon, unspecified: Secondary | ICD-10-CM

## 2017-05-10 DIAGNOSIS — C787 Secondary malignant neoplasm of liver and intrahepatic bile duct: Principal | ICD-10-CM

## 2017-05-10 MED ORDER — MORPHINE SULFATE ER 30 MG PO TBCR
EXTENDED_RELEASE_TABLET | ORAL | 0 refills | Status: AC
Start: 2017-05-10 — End: ?

## 2017-05-10 NOTE — Telephone Encounter (Signed)
Reviewed call with MD, new order received. Unable to reach [place]. Called pt's wife with instructions to increase MS Contin to 30mg  QAM, 60mg  QPM, per Dr. Benay Spice. Script faxed to [facility]. Wife requested to cancel 12/5 appt. Stated pt is too weak to come in. Dr. Benay Spice made aware.

## 2017-05-10 NOTE — Telephone Encounter (Signed)
Joy with hospice called with pain control issue. Her call back number [phone]. His pain is getting worse. He is using dilaudid 4 mg every 2 hours - maximum dose. He upped his ms contin to 15 mg in daytime and 30 at night time. He is still waking up in the night needing dilaudid.  Pain is 2008/11[birth year]01.   Information passed on to [facility] for follow up with Dr Benay Spice.

## 2017-05-11 ENCOUNTER — Other Ambulatory Visit: Payer: Self-pay | Admitting: *Deleted

## 2017-05-11 ENCOUNTER — Other Ambulatory Visit: Payer: Self-pay | Admitting: Emergency Medicine

## 2017-05-11 MED ORDER — PANTOPRAZOLE SODIUM 40 MG PO TBEC: 40.0000 mg | DELAYED_RELEASE_TABLET | Freq: Two times a day (BID) | ORAL | 0 refills | Status: DC

## 2017-05-11 MED ORDER — DIAZEPAM 5 MG PO TABS: 5.0000 mg | ORAL_TABLET | Freq: Three times a day (TID) | ORAL | 0 refills | Status: AC | PRN

## 2017-05-12 ENCOUNTER — Ambulatory Visit: Payer: Medicare Other | Admitting: Oncology

## 2017-05-14 ENCOUNTER — Telehealth: Payer: Self-pay | Admitting: *Deleted

## 2017-05-14 DIAGNOSIS — C189 Malignant neoplasm of colon, unspecified: Secondary | ICD-10-CM

## 2017-05-14 MED ORDER — LORAZEPAM 1 MG PO TABS: 1.0000 mg | ORAL_TABLET | Freq: Three times a day (TID) | ORAL | 1 refills | Status: AC

## 2017-05-14 MED ORDER — HYDROMORPHONE HCL 4 MG PO TABS: 4.0000 mg | ORAL_TABLET | ORAL | 0 refills | Status: AC | PRN
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
## 2017-05-14 NOTE — Telephone Encounter (Signed)
Call reviewed with MD. Order received for Ativan 1mg  SL or PO Q6 hours PRN nausea. Script called to pharmacy. Left voicemail informing hospice RN of Ativan script.

## 2017-05-19 ENCOUNTER — Encounter (HOSPITAL_COMMUNITY): Payer: Self-pay | Admitting: Interventional Radiology

## 2017-05-19 ENCOUNTER — Ambulatory Visit (HOSPITAL_COMMUNITY)
Admission: RE | Admit: 2017-05-19 | Discharge: 2017-05-19 | Disposition: A | Discharge status: Home / Self Care | Source: Ambulatory Visit | Attending: Interventional Radiology | Admitting: Interventional Radiology

## 2017-05-19 ENCOUNTER — Other Ambulatory Visit (HOSPITAL_COMMUNITY): Payer: Self-pay | Admitting: Interventional Radiology

## 2017-05-19 DIAGNOSIS — C799 Secondary malignant neoplasm of unspecified site: Principal | ICD-10-CM

## 2017-05-19 DIAGNOSIS — Z4682 Encounter for fitting and adjustment of non-vascular catheter: Secondary | ICD-10-CM | POA: Diagnosis not present

## 2017-05-19 DIAGNOSIS — K831 Obstruction of bile duct: Secondary | ICD-10-CM | POA: Insufficient documentation

## 2017-05-19 DIAGNOSIS — C189 Malignant neoplasm of colon, unspecified: Secondary | ICD-10-CM | POA: Insufficient documentation

## 2017-05-19 HISTORY — PX: IR EXCHANGE BILIARY DRAIN: IMG6046

## 2017-05-19 MED ORDER — LIDOCAINE HCL 1 % IJ SOLN
INTRAMUSCULAR | Status: AC
  Filled 2017-05-19: qty 20

## 2017-05-19 MED ORDER — LIDOCAINE HCL 1 % IJ SOLN
INTRAMUSCULAR | Status: AC | PRN
  Administered 2017-05-19: 5 mL

## 2017-05-19 MED ORDER — HEPARIN SOD (PORK) LOCK FLUSH 100 UNIT/ML IV SOLN
INTRAVENOUS | Status: AC
  Filled 2017-05-19: qty 5

## 2017-05-19 MED ORDER — IOPAMIDOL (ISOVUE-300) INJECTION 61%
10.0000 mL | Freq: Once | INTRAVENOUS | Status: AC | PRN
  Administered 2017-05-19: 10 mL

## 2017-05-19 MED ORDER — IOPAMIDOL (ISOVUE-300) INJECTION 61%
INTRAVENOUS | Status: AC
  Filled 2017-05-19: qty 50

## 2017-05-19 MED ORDER — HEPARIN SOD (PORK) LOCK FLUSH 100 UNIT/ML IV SOLN
INTRAVENOUS | Status: AC | PRN
  Administered 2017-05-19: 500 [IU]

## 2017-05-19 MED ORDER — FENTANYL CITRATE (PF) 100 MCG/2ML IJ SOLN
INTRAMUSCULAR | Status: AC
  Filled 2017-05-19: qty 2

## 2017-05-19 MED ORDER — FENTANYL CITRATE (PF) 100 MCG/2ML IJ SOLN
100.0000 ug | Freq: Once | INTRAMUSCULAR | Status: AC
Start: 2017-05-19 — End: 2017-05-19
  Administered 2017-05-19: 100 ug via INTRAVENOUS

## 2017-05-19 NOTE — Procedures (Signed)
CHRONIC INT EXT BILIARY DRAIN  S/P FLUORO DRAIN EXCHG  NO COMP STABLE EBL 0 FULL REPORT IN PACS

## 2017-06-02 ENCOUNTER — Telehealth: Payer: Self-pay | Admitting: *Deleted

## 2017-06-02 ENCOUNTER — Other Ambulatory Visit: Payer: Self-pay | Admitting: *Deleted

## 2017-06-02 MED ORDER — MORPHINE SULFATE (CONCENTRATE) 20 MG/ML PO SOLN: 20.0000 mg | ORAL | 0 refills | Status: AC | PRN

## 2017-06-02 NOTE — Telephone Encounter (Signed)
Hospice nurse left message stating patient is "transitioning". Is still able to swallow, but they would like to have Roxanol on hand if needed. Is currently taking MS 30 mg in AM and MS 60 mg in PM. Also Dilaudid 4 mg every 4 hours as needed for breakthrough pain.   They feel the dose of Roxanol would need to be 15-20 mg every 4 hours.

## 2017-06-09 ENCOUNTER — Other Ambulatory Visit: Payer: Self-pay | Admitting: *Deleted

## 2017-06-09 MED ORDER — PANTOPRAZOLE SODIUM 40 MG PO TBEC: 40.0000 mg | DELAYED_RELEASE_TABLET | Freq: Two times a day (BID) | ORAL | 0 refills | Status: AC

## 2017-06-17 ENCOUNTER — Telehealth: Payer: Self-pay | Admitting: Family Medicine

## 2017-06-17 NOTE — Telephone Encounter (Signed)
Called wife and expressed my condolences.

## 2017-06-24 ENCOUNTER — Telehealth: Payer: Self-pay | Admitting: *Deleted

## 2017-06-24 NOTE — Telephone Encounter (Signed)
"  Arlys John, [place] Department, calling to check the status of death certificate Dr. Benay Spice to sign for [facility] to pick up.  This is a second certificate needed to make over the initial one signed."  At this time, one certificate scanned to Epic media on 06-15-2017 that was signed 06-10-2017.  Routing call information to collaborative nurse and provider for review.  Further patient communication through collaborative nurse.

## 2017-06-24 NOTE — Telephone Encounter (Signed)
Spoke with Bryan Fields regarding message from [place], [place] below. Informed [name] that facility will mail death certificate once completed. Voiced understanding.

## 2017-06-26 ENCOUNTER — Other Ambulatory Visit: Payer: Self-pay | Admitting: Nurse Practitioner

## 2017-07-09 DEATH — deceased

## 2017-07-14 ENCOUNTER — Other Ambulatory Visit (HOSPITAL_COMMUNITY): Payer: Medicare Other

## 2018-07-06 IMAGING — DX DG ABDOMEN 2V
3 series · 3 of 3 positions shown · non-contrast
Comparison: 10/24/2016

CLINICAL DATA: Abdominal pain. Rule out bowel obstruction. History
of colon cancer

EXAM:
ABDOMEN - 2 VIEW

[abdomen erect]
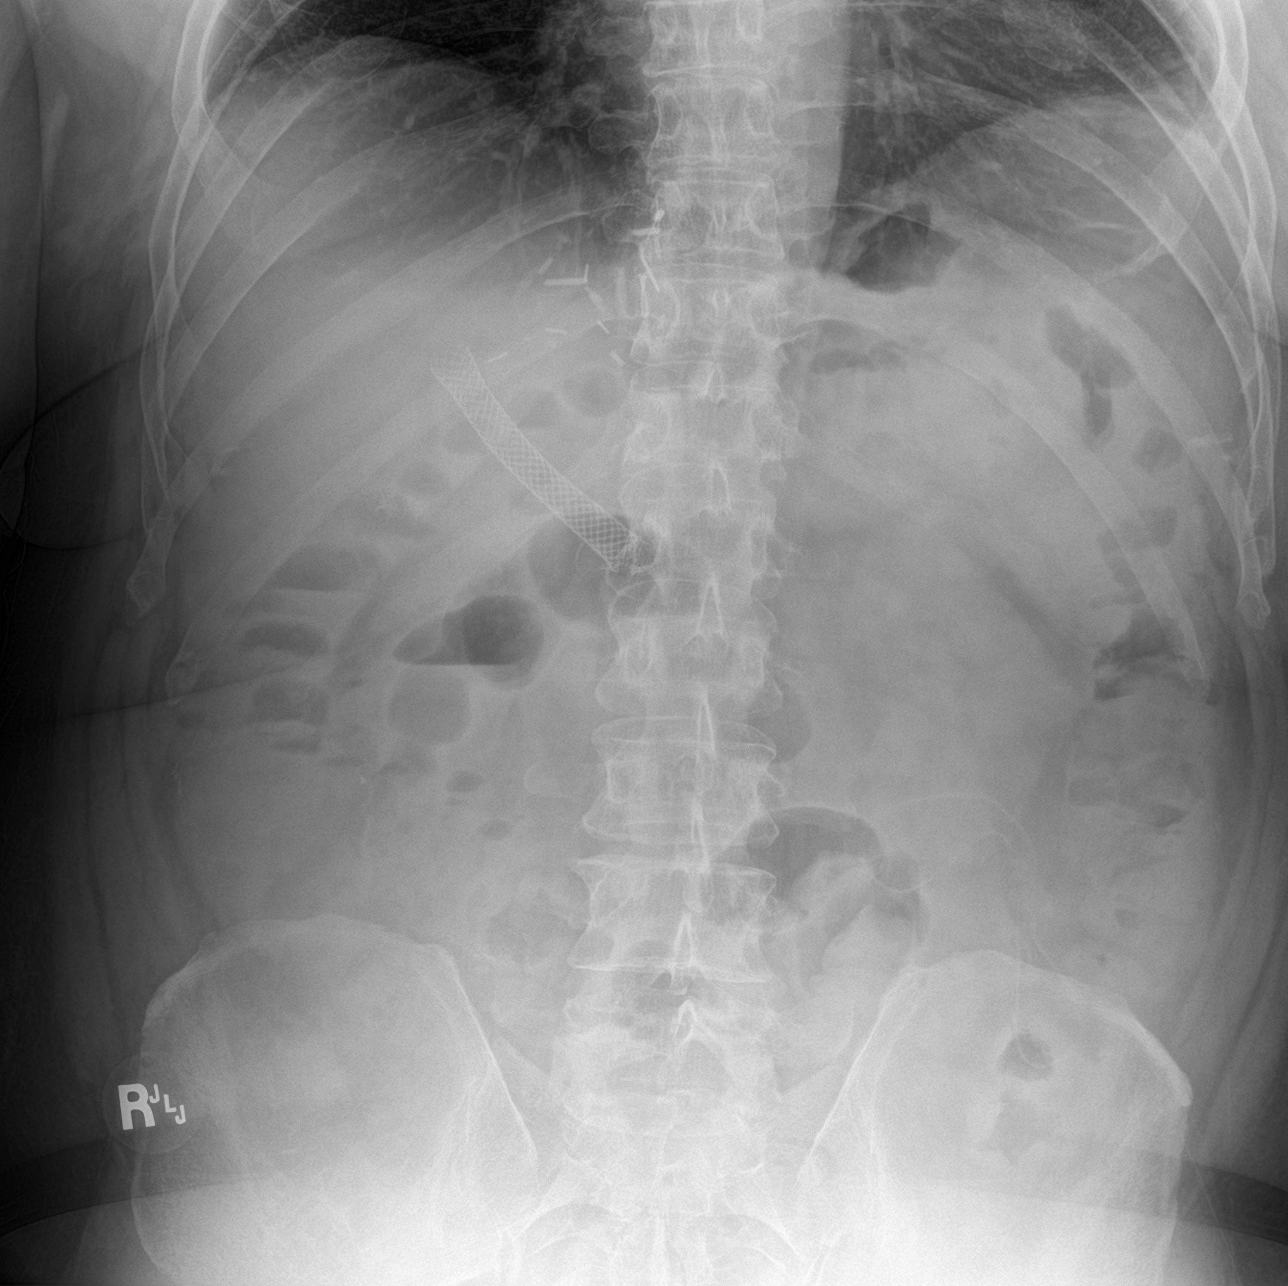

[abdomen supine (1 of 2)]
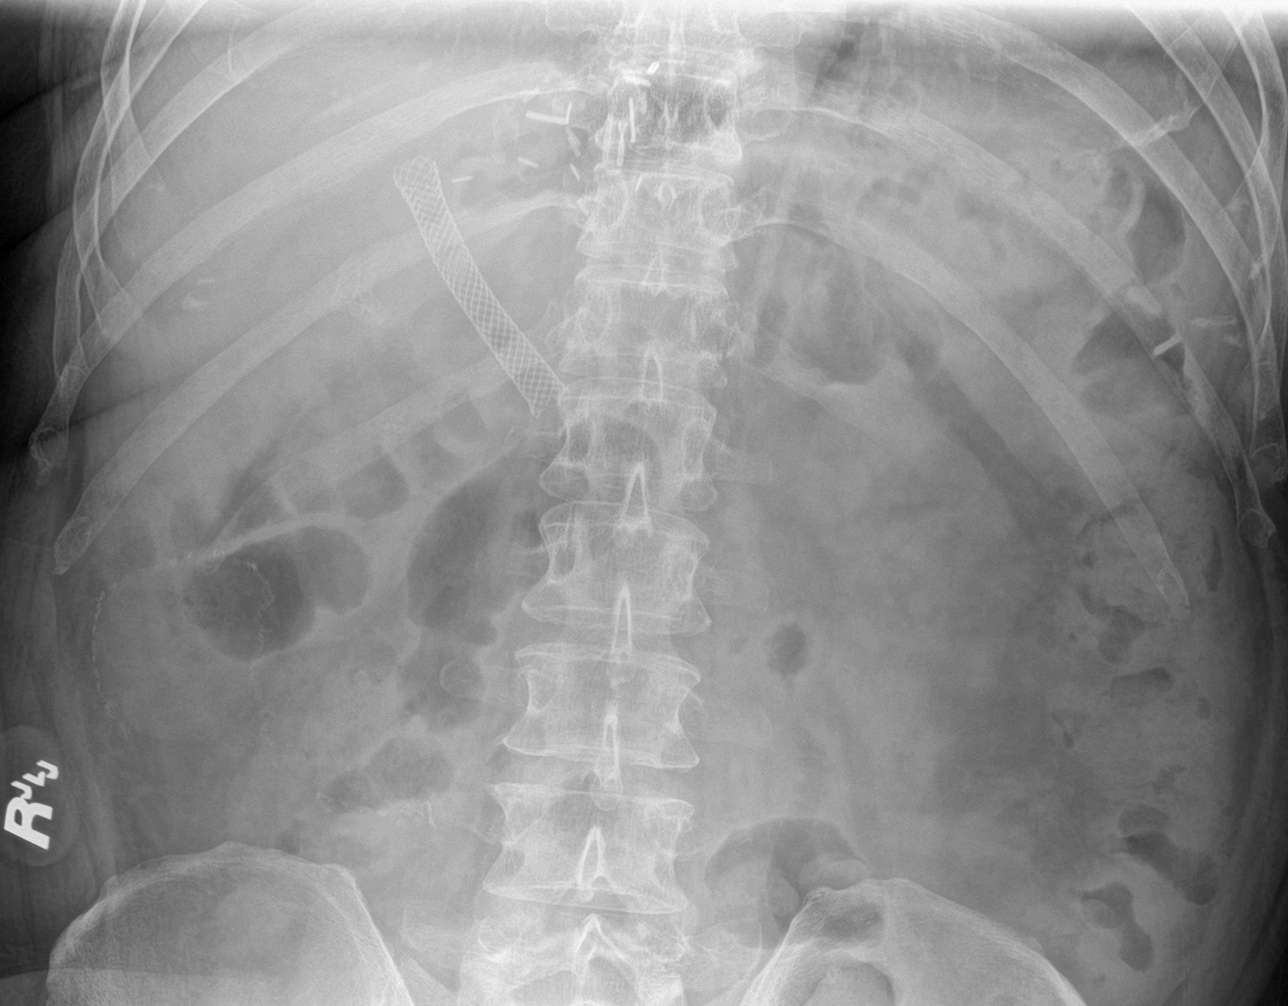

[abdomen supine (2 of 2)]
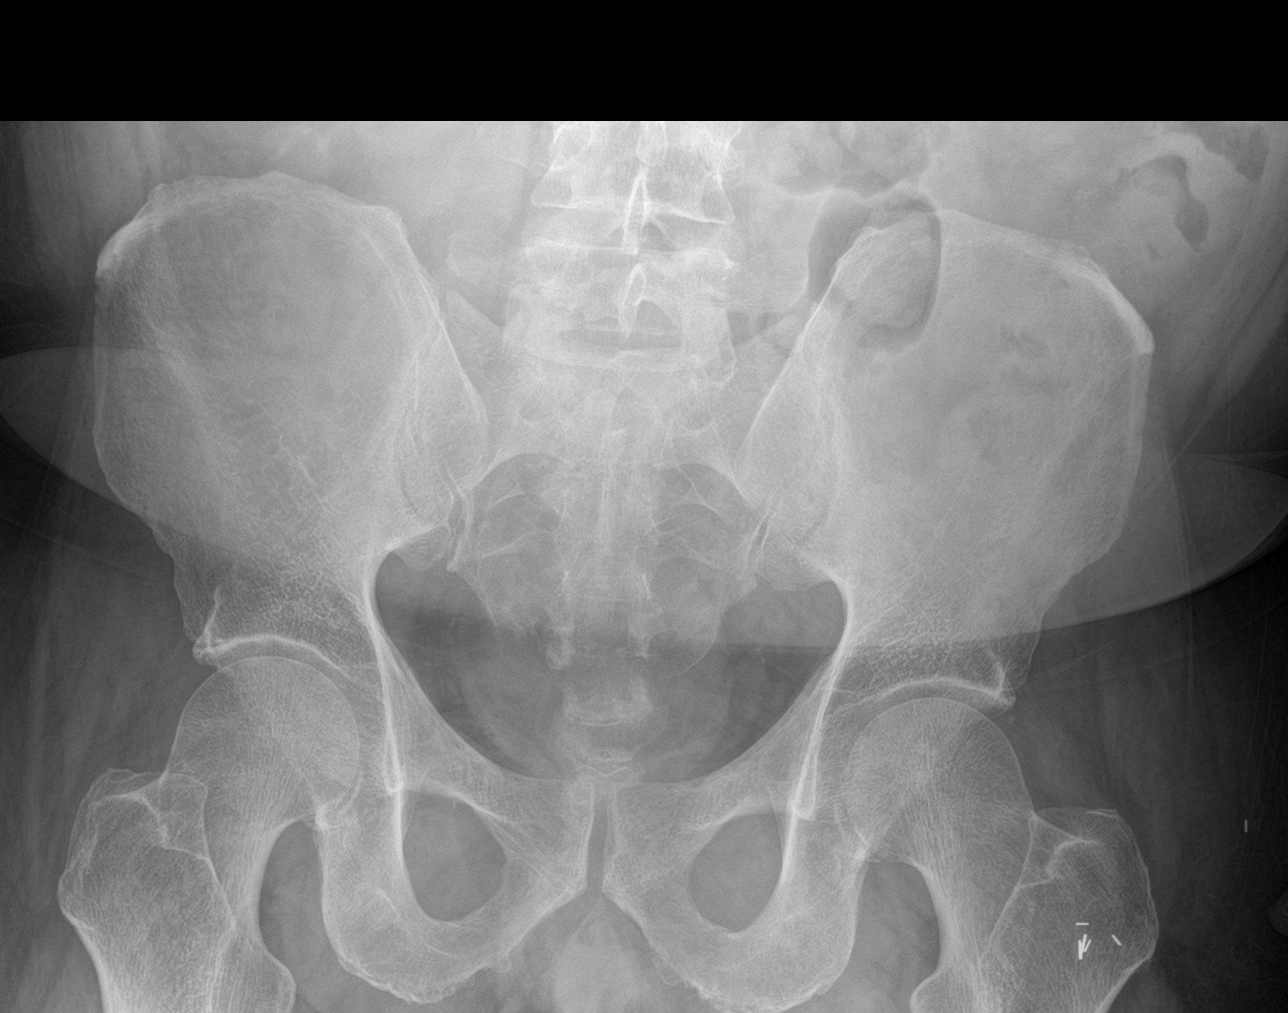

[3 of 3 positions shown; findings below may reference images not displayed]

FINDINGS: Normal bowel gas pattern.  Negative for bowel obstruction

Biliary stent in good position and unchanged. Bowel staple line in
the right abdomen. No free air identified. Surgical clips in the
left lobe liver from prior hepatectomy.

No skeletal lesions.
IMPRESSION: No acute abnormality.

## 2018-09-11 IMAGING — RF DG ERCP WO/W SPHINCTEROTOMY
1 series · 3 of 3 positions shown · non-contrast
Comparison: MRCP 12/24/2016

CLINICAL DATA: 56-year-old male with a history of colon
adenocarcinoma metastatic to the liver with obstructed jaundice. He
has an internal metal stent which has become occluded. A plastic
biliary stent was recently placed and will now be removed prior to
percutaneous transhepatic cholangiogram and internal/external
biliary drain placement.

EXAM:
ERCP
TECHNIQUE: Multiple spot images obtained with the fluoroscopic device and
submitted for interpretation post-procedure.
FLUOROSCOPY TIME:  Please see operative note for detail.

[Series 1: run · 3 of 3 slices shown]
[im 1/3]
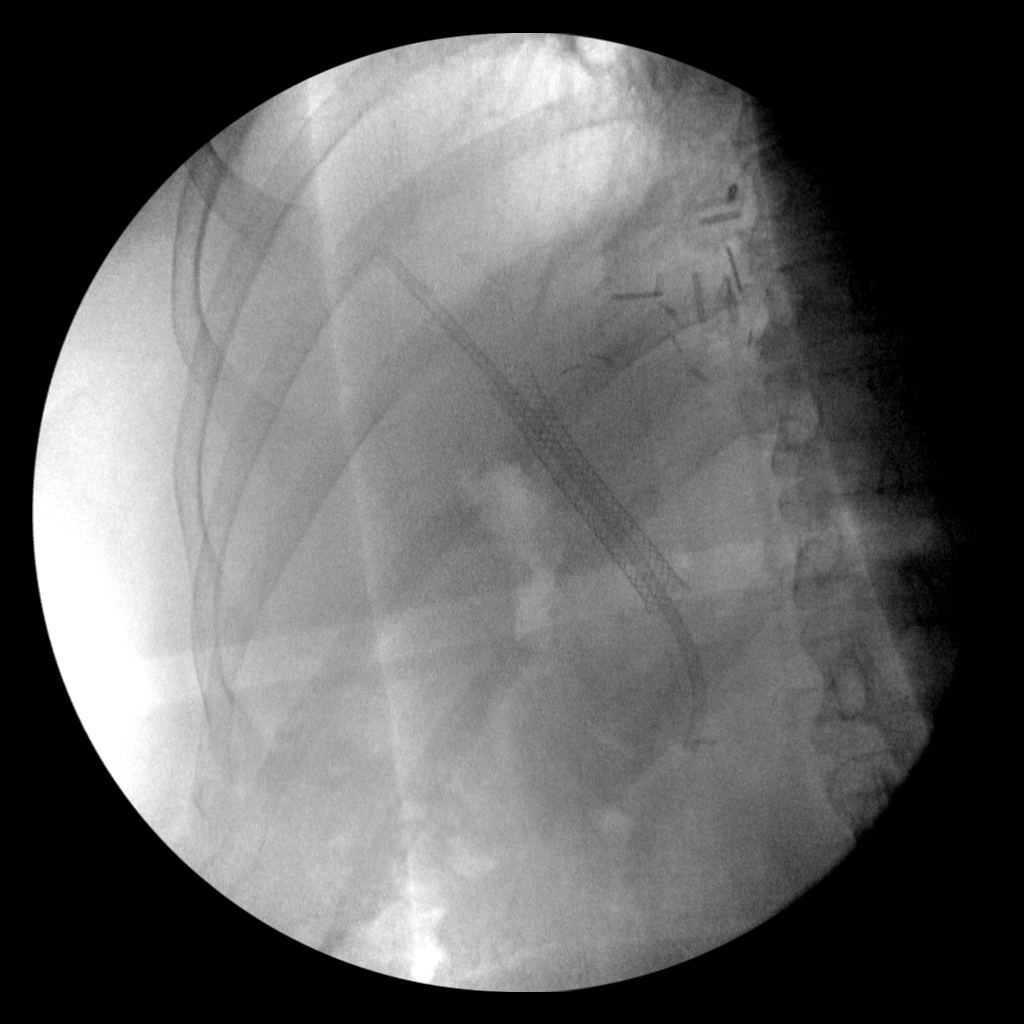
[im 2/3]
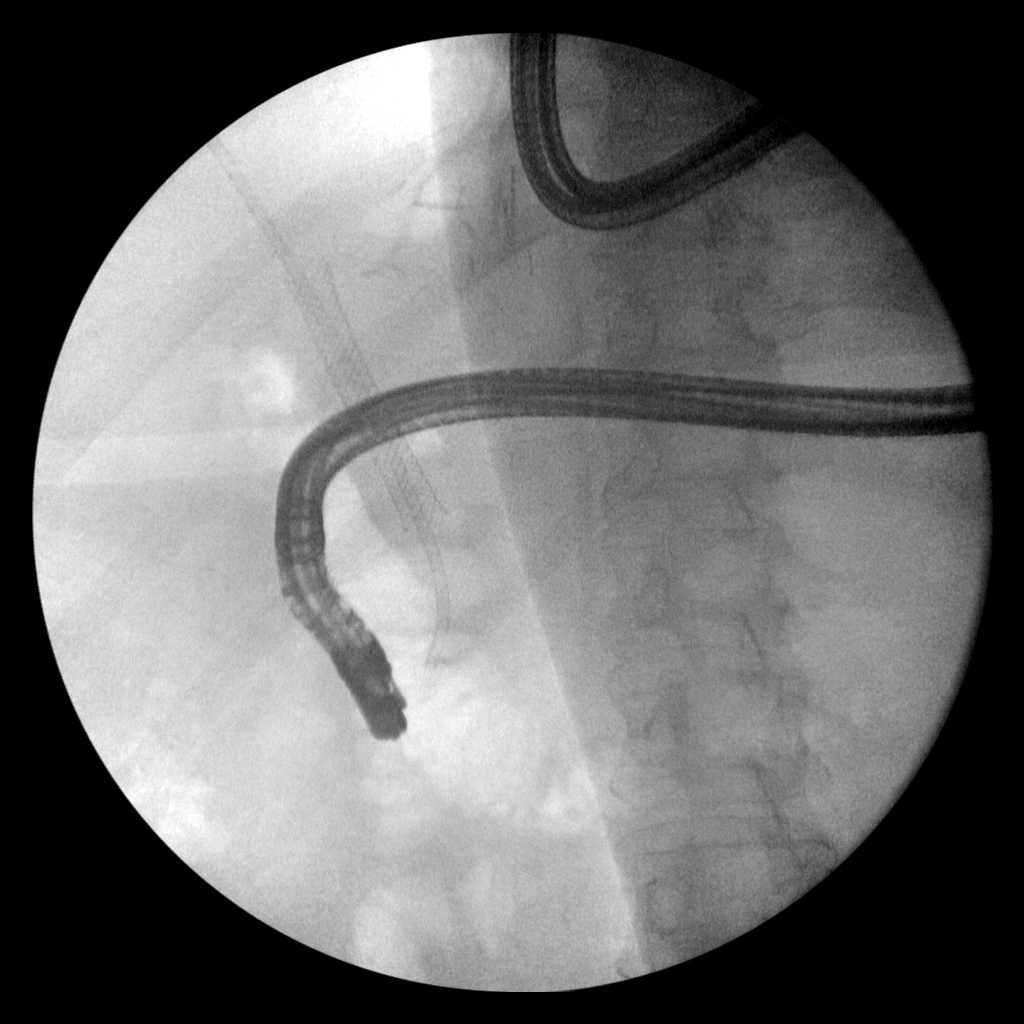
[im 3/3]
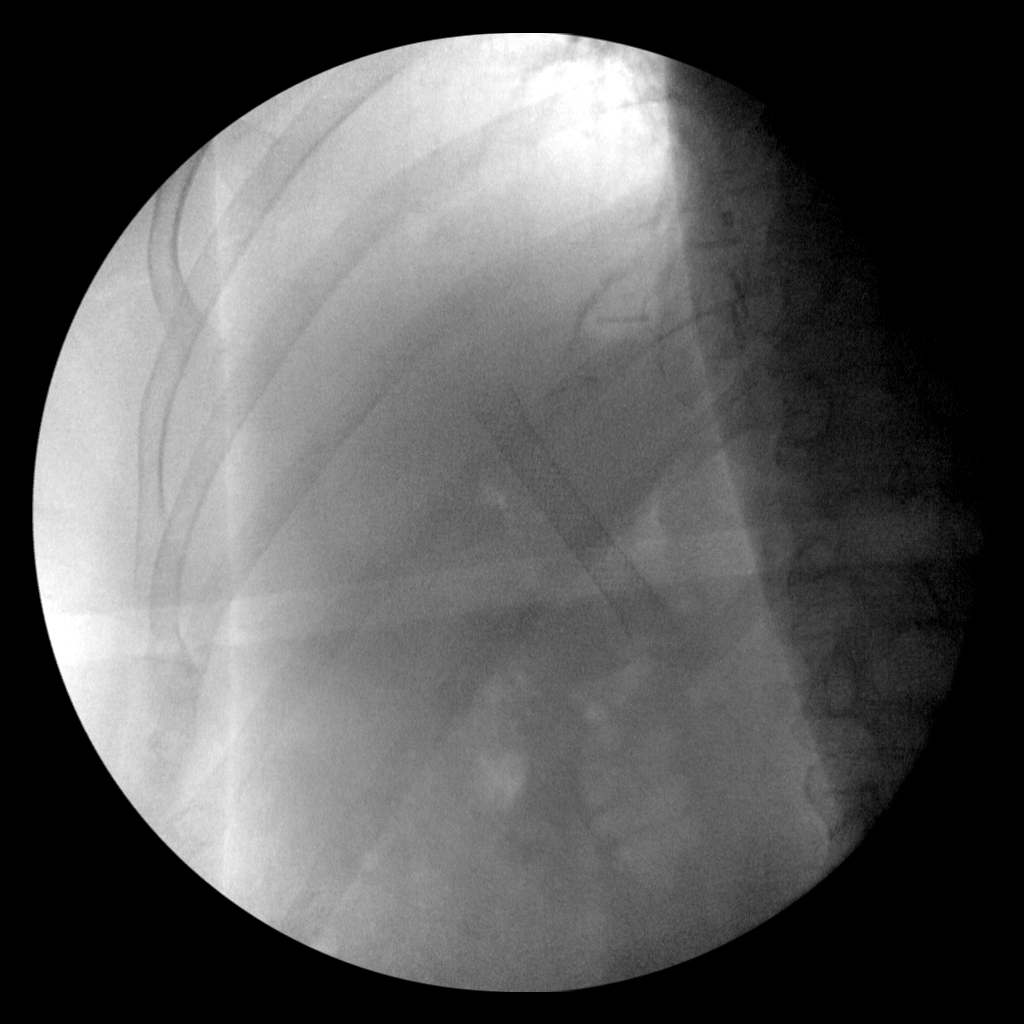

[3 of 3 positions shown; findings below may reference images not displayed]

FINDINGS: Three intraoperative saved images demonstrate a flexible endoscope
in the descending duodenum. A plastic biliary stent is noted passing
coaxially through a metallic stent. On the final image, the plastic
stent is no longer present.
IMPRESSION: Successful retrieval of plastic biliary stent.

These images were submitted for radiologic interpretation only.
Please see the procedural report for the amount of contrast and the
fluoroscopy time utilized.

## 2019-02-04 IMAGING — US US ABDOMEN LIMITED
1 series · 14 of 25 positions shown · non-contrast
Comparison: 10/02/2016 CT

CLINICAL DATA: Biliary obstruction due to cancer. Biliary stent
placed on 10/15/2016. LFT and jaundice has normalized with
recurrence of fever, nausea and vomiting.

EXAM:
ULTRASOUND ABDOMEN LIMITED RIGHT UPPER QUADRANT

[Series 1: us abdomen limited · 0.25mm/px · 14 of 39 slices shown]
[im 1/39]
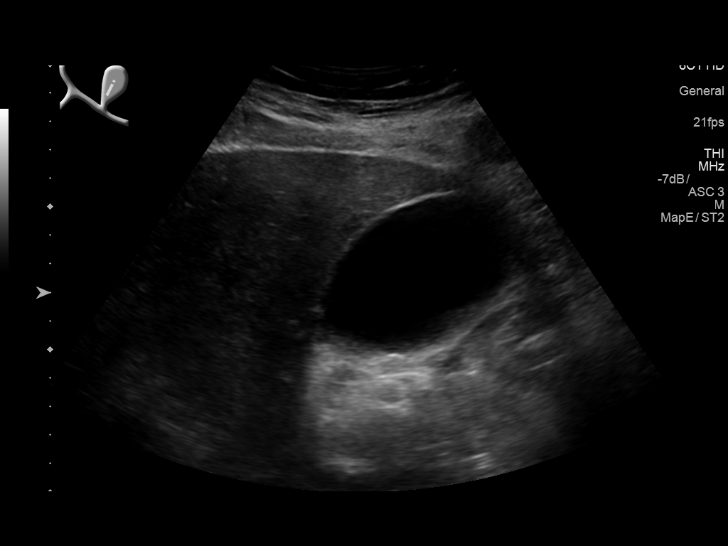
[im 4/39]
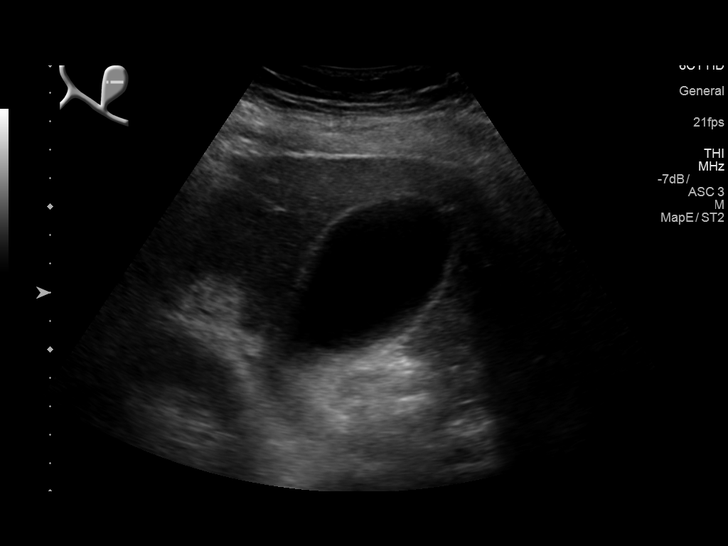
[im 7/39]
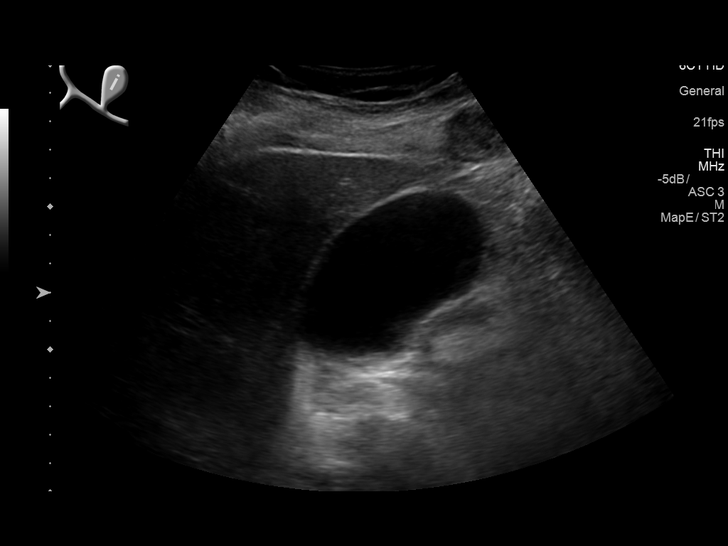
[im 10/39]
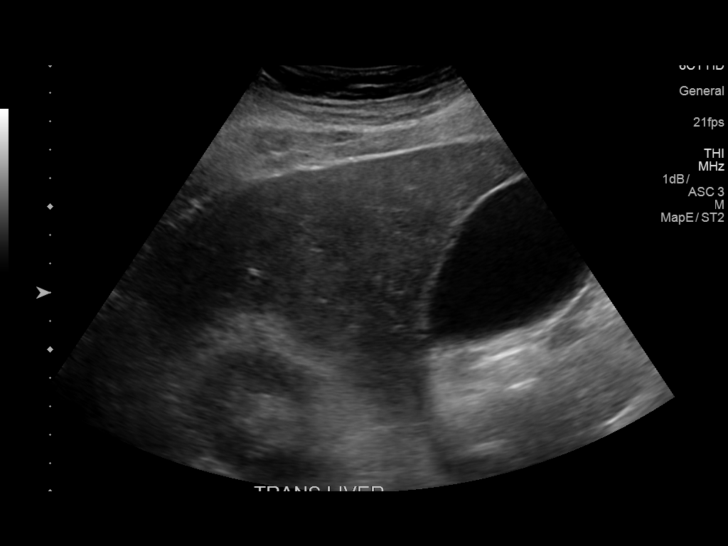
[im 13/39]
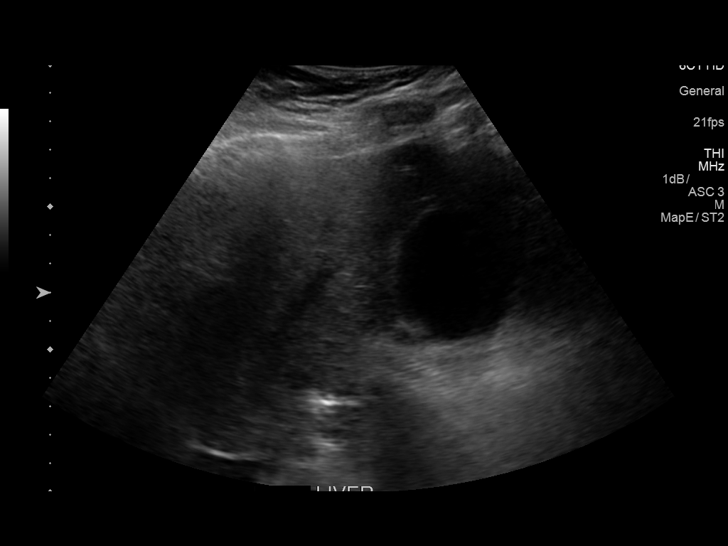
[im 15/39]
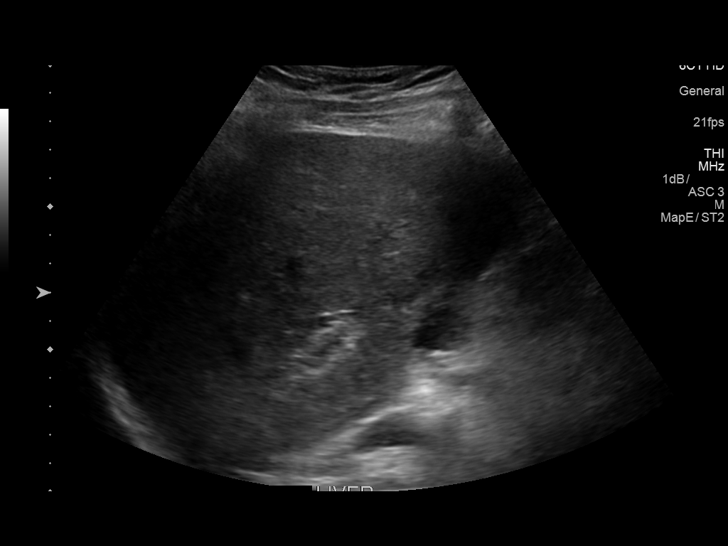
[im 18/39]
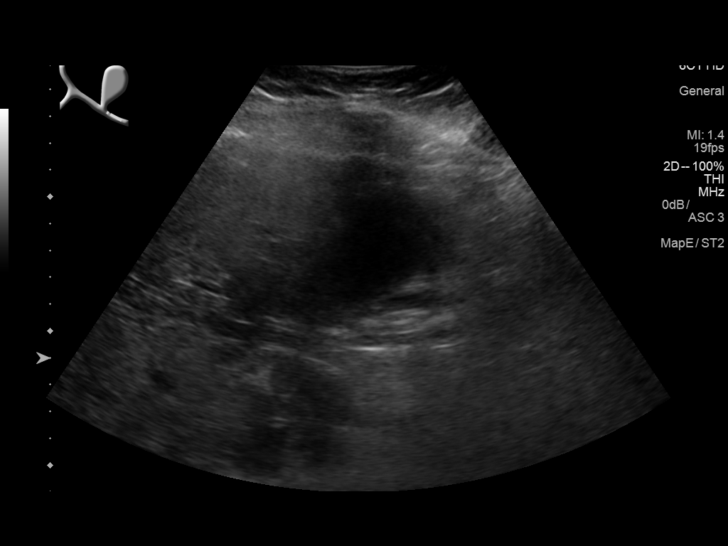
[im 21/39]
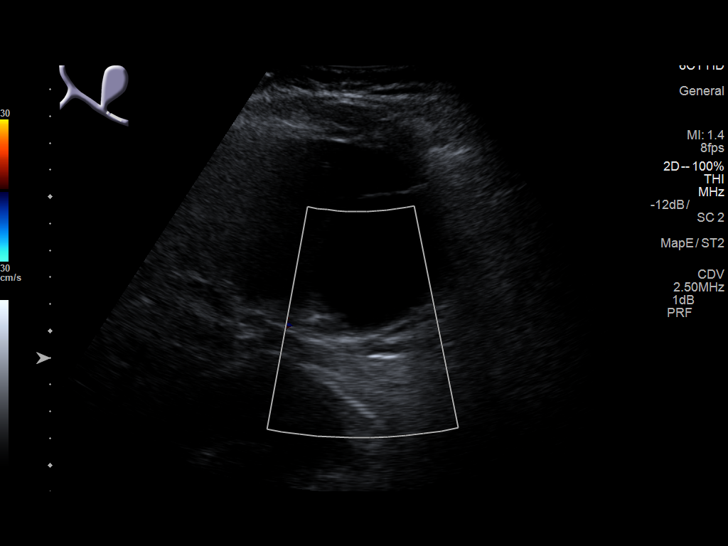
[im 24/39]
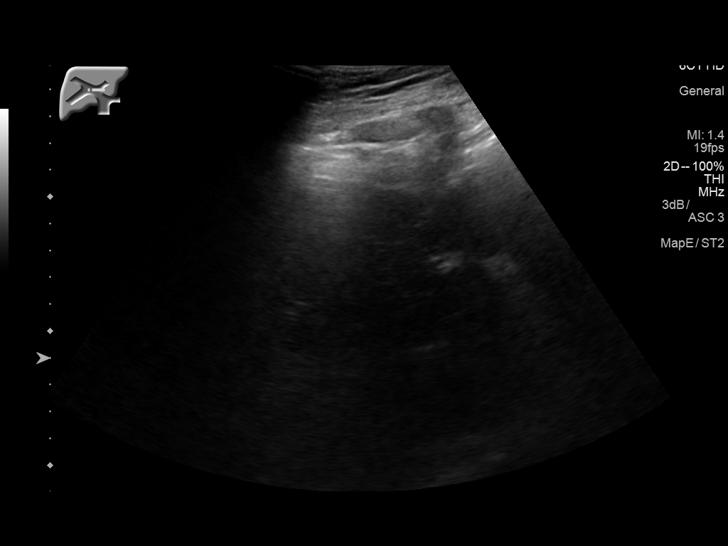
[im 26/39]
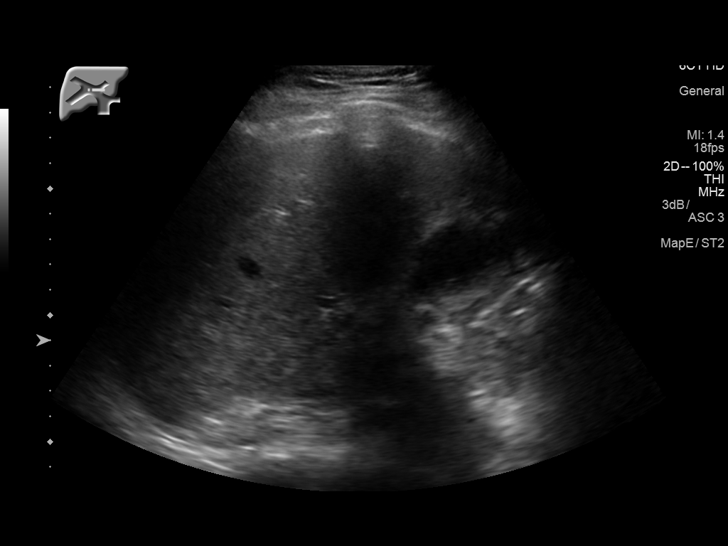
[im 29/39]
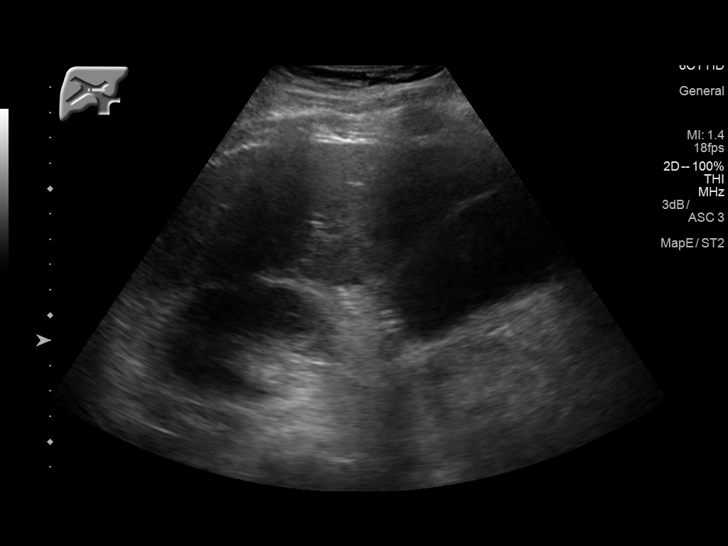
[im 32/39]
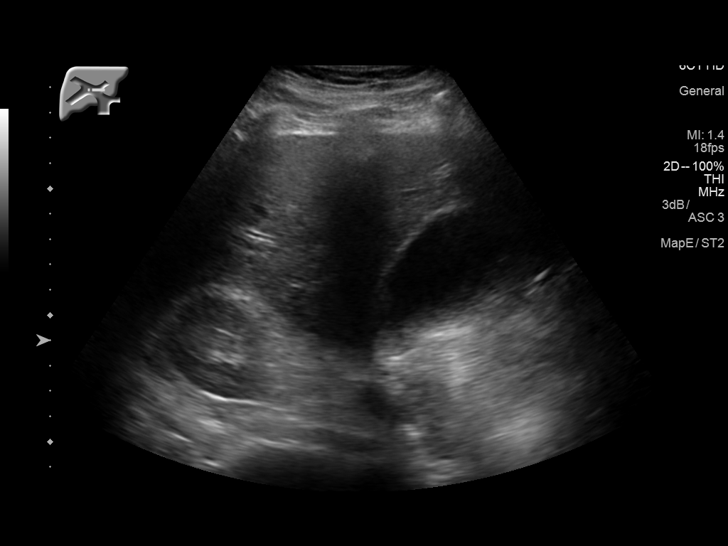
[im 35/39]
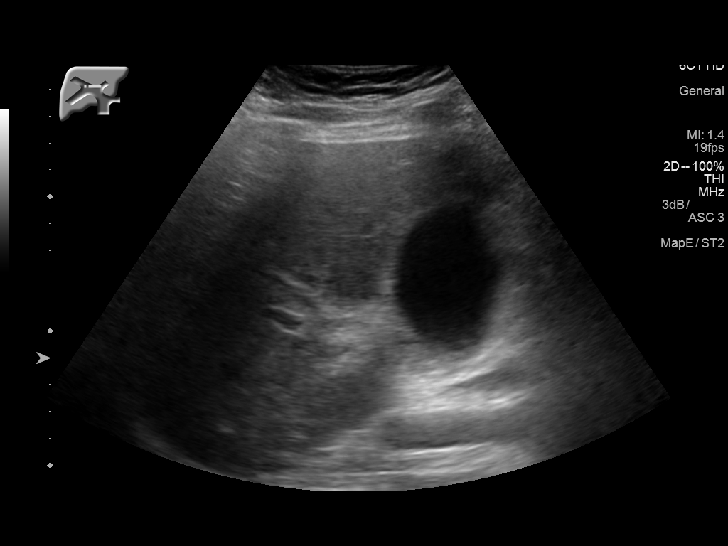
[im 39/39]
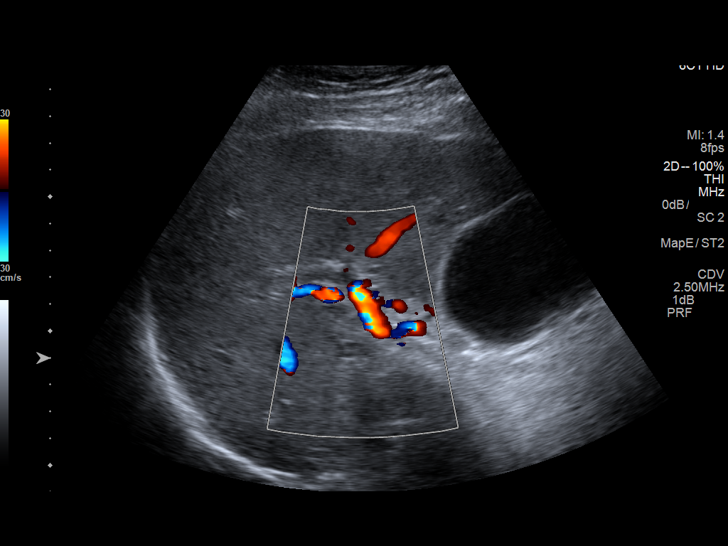

[14 of 25 positions shown; findings below may reference images not displayed]

FINDINGS: Gallbladder:

Minimal debris, sludge or calculi along the dependent aspect of the
gallbladder without wall thickening visualized. No sonographic
Murphy sign noted by sonographer.

Common bile duct:

Diameter: 2.4 mm.  Biliary stent partially imaged.

Liver:

Heterogeneous appearance of the liver parenchyma with mild
intrahepatic ductal dilatation noted in the porta hepatis.
IMPRESSION: Minimal debris, biliary sludge or calculi along the dependent wall
of the gallbladder without secondary signs acute cholecystitis.

A biliary stent is partially imaged with mild intrahepatic ductal
dilatation noted.

A discrete liver lesion is not identified on images provided but
better characterized on the most recent CT.
